# Patient Record
Sex: Male | Born: 1935 | Race: White | Hispanic: No | Smoking: Former smoker
Health system: Southern US, Community
[De-identification: ages and names within clinical notes are randomized; demographics above are authoritative.]

## PROBLEM LIST (undated history)

## (undated) DIAGNOSIS — I10 Essential (primary) hypertension: Secondary | ICD-10-CM

## (undated) DIAGNOSIS — N400 Enlarged prostate without lower urinary tract symptoms: Secondary | ICD-10-CM

## (undated) DIAGNOSIS — I441 Atrioventricular block, second degree: Secondary | ICD-10-CM

## (undated) DIAGNOSIS — I5032 Chronic diastolic (congestive) heart failure: Secondary | ICD-10-CM

## (undated) DIAGNOSIS — I2510 Atherosclerotic heart disease of native coronary artery without angina pectoris: Secondary | ICD-10-CM

## (undated) DIAGNOSIS — R001 Bradycardia, unspecified: Secondary | ICD-10-CM

## (undated) DIAGNOSIS — I639 Cerebral infarction, unspecified: Secondary | ICD-10-CM

## (undated) DIAGNOSIS — K922 Gastrointestinal hemorrhage, unspecified: Secondary | ICD-10-CM

## (undated) DIAGNOSIS — I350 Nonrheumatic aortic (valve) stenosis: Secondary | ICD-10-CM

## (undated) DIAGNOSIS — H3530 Unspecified macular degeneration: Secondary | ICD-10-CM

## (undated) DIAGNOSIS — Z8551 Personal history of malignant neoplasm of bladder: Secondary | ICD-10-CM

## (undated) DIAGNOSIS — M1990 Unspecified osteoarthritis, unspecified site: Secondary | ICD-10-CM

## (undated) DIAGNOSIS — I4821 Permanent atrial fibrillation: Secondary | ICD-10-CM

## (undated) DIAGNOSIS — D649 Anemia, unspecified: Secondary | ICD-10-CM

## (undated) DIAGNOSIS — E785 Hyperlipidemia, unspecified: Secondary | ICD-10-CM

## (undated) HISTORY — PX: COLONOSCOPY: SHX174

## (undated) HISTORY — DX: Unspecified osteoarthritis, unspecified site: M19.90

## (undated) HISTORY — PX: TOE AMPUTATION: SHX809

## (undated) HISTORY — DX: Benign prostatic hyperplasia without lower urinary tract symptoms: N40.0

## (undated) HISTORY — DX: Atrioventricular block, second degree: I44.1

## (undated) HISTORY — DX: Gastrointestinal hemorrhage, unspecified: K92.2

## (undated) HISTORY — DX: Anemia, unspecified: D64.9

## (undated) HISTORY — DX: Essential (primary) hypertension: I10

## (undated) HISTORY — DX: Bradycardia, unspecified: R00.1

## (undated) HISTORY — PX: COSMETIC SURGERY: SHX468

## (undated) HISTORY — DX: Nonrheumatic aortic (valve) stenosis: I35.0

## (undated) HISTORY — PX: ESOPHAGOGASTRODUODENOSCOPY: SHX1529

## (undated) HISTORY — DX: Personal history of malignant neoplasm of bladder: Z85.51

## (undated) HISTORY — DX: Hyperlipidemia, unspecified: E78.5

## (undated) HISTORY — DX: Atherosclerotic heart disease of native coronary artery without angina pectoris: I25.10

---

## 2005-04-19 ENCOUNTER — Ambulatory Visit: Payer: Self-pay | Admitting: Unknown Physician Specialty

## 2005-07-20 ENCOUNTER — Inpatient Hospital Stay: Payer: Self-pay | Admitting: Internal Medicine

## 2005-07-20 ENCOUNTER — Other Ambulatory Visit: Payer: Self-pay

## 2005-07-21 ENCOUNTER — Other Ambulatory Visit: Payer: Self-pay

## 2006-09-19 ENCOUNTER — Encounter: Payer: Self-pay | Admitting: Unknown Physician Specialty

## 2006-10-09 ENCOUNTER — Encounter: Payer: Self-pay | Admitting: Unknown Physician Specialty

## 2006-11-09 ENCOUNTER — Encounter: Payer: Self-pay | Admitting: Unknown Physician Specialty

## 2007-04-02 ENCOUNTER — Ambulatory Visit: Payer: Self-pay | Admitting: Podiatry

## 2007-05-21 ENCOUNTER — Ambulatory Visit: Payer: Self-pay | Admitting: Podiatry

## 2007-06-17 ENCOUNTER — Ambulatory Visit: Payer: Self-pay | Admitting: Podiatry

## 2007-07-16 ENCOUNTER — Encounter: Payer: Self-pay | Admitting: Cardiology

## 2007-07-16 ENCOUNTER — Inpatient Hospital Stay (HOSPITAL_BASED_OUTPATIENT_CLINIC_OR_DEPARTMENT_OTHER): Admission: RE | Admit: 2007-07-16 | Discharge: 2007-07-16 | Payer: Self-pay | Admitting: Cardiology

## 2007-07-16 ENCOUNTER — Ambulatory Visit (HOSPITAL_COMMUNITY): Admission: RE | Admit: 2007-07-16 | Discharge: 2007-07-16 | Payer: Self-pay | Admitting: Cardiology

## 2007-07-16 HISTORY — PX: CARDIAC CATHETERIZATION: SHX172

## 2007-07-20 ENCOUNTER — Ambulatory Visit: Payer: Self-pay | Admitting: Cardiothoracic Surgery

## 2007-07-27 ENCOUNTER — Encounter: Payer: Self-pay | Admitting: Cardiothoracic Surgery

## 2007-07-27 ENCOUNTER — Ambulatory Visit: Payer: Self-pay | Admitting: Cardiothoracic Surgery

## 2007-07-27 ENCOUNTER — Inpatient Hospital Stay (HOSPITAL_COMMUNITY): Admission: RE | Admit: 2007-07-27 | Discharge: 2007-08-06 | Payer: Self-pay | Admitting: Cardiothoracic Surgery

## 2007-07-27 HISTORY — PX: CORONARY ARTERY BYPASS GRAFT: SHX141

## 2007-07-27 HISTORY — PX: AORTIC VALVE REPLACEMENT: SHX41

## 2007-08-25 ENCOUNTER — Ambulatory Visit (HOSPITAL_COMMUNITY): Admission: RE | Admit: 2007-08-25 | Discharge: 2007-08-25 | Payer: Self-pay | Admitting: Cardiology

## 2007-08-27 ENCOUNTER — Ambulatory Visit: Payer: Self-pay | Admitting: Cardiothoracic Surgery

## 2007-08-27 ENCOUNTER — Encounter: Admission: RE | Admit: 2007-08-27 | Discharge: 2007-08-27 | Payer: Self-pay | Admitting: Cardiothoracic Surgery

## 2007-09-09 ENCOUNTER — Encounter: Payer: Self-pay | Admitting: Cardiology

## 2007-10-09 ENCOUNTER — Encounter: Payer: Self-pay | Admitting: Cardiology

## 2007-11-09 ENCOUNTER — Encounter: Payer: Self-pay | Admitting: Cardiology

## 2007-11-26 ENCOUNTER — Ambulatory Visit: Payer: Self-pay | Admitting: Cardiothoracic Surgery

## 2007-12-09 ENCOUNTER — Encounter: Payer: Self-pay | Admitting: Cardiology

## 2008-01-07 ENCOUNTER — Inpatient Hospital Stay: Payer: Self-pay | Admitting: Internal Medicine

## 2008-01-07 ENCOUNTER — Ambulatory Visit: Payer: Self-pay | Admitting: Internal Medicine

## 2008-01-14 ENCOUNTER — Encounter: Payer: Self-pay | Admitting: Internal Medicine

## 2008-02-09 ENCOUNTER — Encounter: Payer: Self-pay | Admitting: Internal Medicine

## 2008-02-23 ENCOUNTER — Encounter: Payer: Self-pay | Admitting: Internal Medicine

## 2008-03-10 ENCOUNTER — Encounter: Payer: Self-pay | Admitting: Internal Medicine

## 2008-04-10 ENCOUNTER — Encounter: Payer: Self-pay | Admitting: Internal Medicine

## 2008-05-10 ENCOUNTER — Encounter: Payer: Self-pay | Admitting: Internal Medicine

## 2008-06-10 ENCOUNTER — Encounter: Payer: Self-pay | Admitting: Internal Medicine

## 2008-07-11 ENCOUNTER — Encounter: Payer: Self-pay | Admitting: Internal Medicine

## 2008-08-08 ENCOUNTER — Encounter: Payer: Self-pay | Admitting: Internal Medicine

## 2008-09-08 ENCOUNTER — Encounter: Payer: Self-pay | Admitting: Internal Medicine

## 2009-02-08 ENCOUNTER — Encounter: Payer: Self-pay | Admitting: Orthopedic Surgery

## 2009-03-10 ENCOUNTER — Encounter: Payer: Self-pay | Admitting: Orthopedic Surgery

## 2009-09-13 HISTORY — PX: US ECHOCARDIOGRAPHY: HXRAD669

## 2010-02-20 ENCOUNTER — Ambulatory Visit: Payer: Self-pay | Admitting: Cardiology

## 2010-08-23 ENCOUNTER — Ambulatory Visit (INDEPENDENT_AMBULATORY_CARE_PROVIDER_SITE_OTHER): Payer: Medicare Other | Admitting: Cardiology

## 2010-08-23 DIAGNOSIS — I10 Essential (primary) hypertension: Secondary | ICD-10-CM

## 2010-08-23 DIAGNOSIS — I359 Nonrheumatic aortic valve disorder, unspecified: Secondary | ICD-10-CM

## 2010-08-23 DIAGNOSIS — E119 Type 2 diabetes mellitus without complications: Secondary | ICD-10-CM

## 2010-08-23 DIAGNOSIS — I2510 Atherosclerotic heart disease of native coronary artery without angina pectoris: Secondary | ICD-10-CM

## 2010-08-29 ENCOUNTER — Other Ambulatory Visit: Payer: Self-pay | Admitting: *Deleted

## 2010-08-29 DIAGNOSIS — I2510 Atherosclerotic heart disease of native coronary artery without angina pectoris: Secondary | ICD-10-CM

## 2010-08-29 MED ORDER — DOXAZOSIN MESYLATE 2 MG PO TABS: 2.0000 mg | ORAL_TABLET | Freq: Every day | ORAL | Status: DC

## 2010-10-23 NOTE — Cardiovascular Report (Signed)
[name], [name]               ACCOUNT NO.:  [id number]   MEDICAL RECORD NO.:  [id number]          PATIENT TYPE:  OIB   LOCATION:  1966                         FACILITY:  [facility]   PHYSICIAN:  Peter M. Swaziland, M.D.  DATE OF BIRTH:  1928-02-21   DATE OF PROCEDURE:  07/16/2007  DATE OF DISCHARGE:                            CARDIAC CATHETERIZATION   INDICATIONS FOR PROCEDURE:  A 77 year old white male who presents with  symptomatic progressive aortic stenosis that is severe by  echocardiogram. He has longstanding history of diabetes and  hypertension.   PROCEDURE:  Left heart catheterization, coronary angiography, access via  the right femoral artery and vein using standard Seldinger technique.   EQUIPMENT:  5-French arterial sheath, 5-French JL-5 catheter, a 4-French  3-DRC catheter, 4-French pigtail catheter, 7-French venous sheath and a  balloon-tip Swan-Ganz catheter.   MEDICATIONS:  Local anesthesia 1% Xylocaine.   CONTRAST:  110 mL of Omnipaque.   HEMODYNAMIC DATA:  Right heart pressures:  Right atrial pressure 6/6  with a mean of 3 mmHg.  Right ventricle pressure was 42 with EDP of 4  mmHg.  Pulmonary artery pressures 33/12 with a mean of 22 mmHg.  Pulmonary capillary wedge pressure 16/12 with a mean of 9 mmHg.  Aortic  pressure was 151/67 with a mean of 103 mmHg.  Left ventricular pressures  were not obtained as we did not cross the valve.  Cardiac output by Fick  was 6.7 liters per minute with an index of 2.86.  By thermodilution,  cardiac output was 7.5 with an index of 3.21.   ANGIOGRAPHIC DATA:  Proximal aortography demonstrated mild diffuse  dilatation of the thoracic aorta.  The aortic valve is heavily calcified  and immobile.  There is mild aortic insufficiency.   The left coronary rises and distributes normally.  The left main  coronary is without obstructive disease.   The left anterior descending artery is calcified in the proximal and mid  vessel.  In the  mid vessel, there is a focal 50-70% stenosis.  There is  also a 50-60% stenosis at the takeoff of second diagonal branch which  arises somewhat distally.   The left circumflex coronary is relatively small in caliber with mild  diffuse irregularities less than 20%.   The right coronary is a very large dominant vessel and appears normal.   FINAL INTERPRETATION:  1. Single vessel atherosclerotic coronary artery disease involving the      mid-LAD.  2. Mild aortic insufficiency.  3. Mild pulmonary hypertension.  4. Mild enlargement of the thoracic aorta.   PLAN:  Recommend referral for aortic valve replacement and probable  bypass surgery of the left anterior descending.           ______________________________  Peter M. Swaziland, M.D.     PMJ/MEDQ  D:  07/16/2007  T:  07/17/2007  Job:  [id number]   cc:   Bradd Canary, MD
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
## 2010-10-23 NOTE — Op Note (Signed)
[name], [name]               ACCOUNT NO.:  [id number]   MEDICAL RECORD NO.:  [id number]          PATIENT TYPE:  OIB   LOCATION:  2854                         FACILITY:  [facility]   PHYSICIAN:  Peter M. Swaziland, M.D.  DATE OF BIRTH:  1928/06/03   DATE OF PROCEDURE:  08/25/2007  DATE OF DISCHARGE:                               OPERATIVE REPORT   PROCEDURE:  Elective cardioversion.   INDICATIONS FOR PROCEDURE:  A 77 year old white male status post aortic  valve replacement and coronary artery bypass surgery who has persistent  atrial flutter despite amiodarone therapy.   The initial ECG on arrival showed wide complex rhythm at a rate of 100  beats per minute.  There were no P-waves or flutter waves visible.  We  attempted carotid sinus massage without significant change in the  rhythm.  The patient was then given 6 mg of IV Adenocard which allowed  slowing of his rate and visible flutter waves.  It was therefore felt  that he was in atrial flutter with 2:1 AV conduction.  We proceeded with  elective DC cardioversion.  The patient received 250 mg of IV Pentothal  per anesthesia.  He was given a single synchronized biphasic DC shock at  150 joules with conversion to normal sinus rhythm with first-degree AV  block and a rate of 77 beats per minute.  The patient tolerated the  procedure well without complications.   FINAL ASSESSMENT:  Successful elective direct current cardioversion.           ______________________________  Peter M. Swaziland, M.D.     PMJ/MEDQ  D:  08/25/2007  T:  08/25/2007  Job:  [id number]   cc:   Sheliah Plane, MD  Diona Fanti

## 2010-10-23 NOTE — Discharge Summary (Signed)
NAMETHEOREN, PALKA               ACCOUNT NO.:  [id number]   MEDICAL RECORD NO.:  [id number]          PATIENT TYPE:  INP   LOCATION:  2015                         FACILITY:  [facility]   PHYSICIAN:  Sheliah Plane, MD    DATE OF BIRTH:  01/27/1928   DATE OF ADMISSION:  07/27/2007  DATE OF DISCHARGE:  07/31/2007                               DISCHARGE SUMMARY   PRIMARY ADMITTING DIAGNOSIS:  Severe aortic stenosis.   ADDITIONAL/DISCHARGE DIAGNOSES:  1. Severe aortic stenosis.  2. Single-vessel coronary artery disease.  3. Hypertension.  4. Benign prostatic hypertrophy.  5. Postoperative blood loss anemia.  6. Type 2 diabetes mellitus, insulin dependent.   PROCEDURES PERFORMED:  1. Aortic valve replacement with 25-mm Edwards Magna pericardial      valve.  2. Coronary artery bypass grafting x1 (left internal mammary artery to      the LAD).   HISTORY:  The patient is a 77 year old male who initially presented  several years ago with vague chest discomfort and was noted to have a  cardiac murmur.  He was found to have aortic stenosis which has been  followed with serial echocardiograms since that time by Dr. Swaziland.  His  symptoms have been progressive, particularly dyspnea on exertion with  lightheadedness.  He has had no chest pain or palpitations.  A recent  echocardiogram showed worsening aortic stenosis with peak velocity of  4.9 meters per second.  He had moderate concentric left ventricular  hypertrophy with normal systolic function, severe calcific aortic  stenosis, trace mitral regurgitation, and a normal aortic root.  He  underwent left and right heart catheterization which confirmed critical  aortic stenosis as well as approximately 60% stenosis of the LAD.  He  was subsequently referred to Dr. Sheliah Plane for consideration of  aortic valve replacement at this time.  Dr. Tyrone Sage saw the patient and  reviewed his studies and felt that best course of action would be to  proceed with aortic valve replacement and single-vessel bypass at this  time.  He explained the risks, benefits and alternatives of the  procedure to the patient and family, and he agreed to proceed with  surgery.   HOSPITAL COURSE:  He was admitted to [facility] on July 27, 2007 and underwent aortic valve replacement as well as CABG x1 as  described in detail above.  He tolerated the procedure well and was  transferred to the SICU in stable condition.  He was able to be  extubated shortly after surgery.  He was hemodynamically stable and  doing well on postoperative day #1.  At that time, his chest tubes and  hemodynamic monitoring devices were removed.  He was kept in the unit  for further observation.  He was started on aggressive diuresis for  postoperative volume overload.  By postoperative day #2, he was ready  for transfer to the floor.  Postoperatively, his blood pressures have  been trending upward, and he has been started back on his home dose of  amlodipine as well as started on a beta blocker which has been titrated  upward and back on an ACE inhibitor which has been titrated upward.  As  his p.o. intake has improved, he has been restarted on his home doses of  insulin, and his blood sugars have responded well.  He is diuresing well  although he still remains volume overloaded with weight approximately 12  kg above his preoperative weight.  Because of the hyponatremia with his  aggressive diuresis, his Lasix dose has been decreased to once daily,  and a BMET will be rechecked prior to discharge.  He is otherwise doing  fairly well.  He is ambulating the halls with cardiac rehab phase 1 and  is making good progress.  His incisions are all healing well.  He has  remained afebrile, and his vital signs have otherwise remained stable.  He is maintaining O2 saturations of greater than 90% on room air.  He  has had a mild blood loss anemia which has been stable, and he  has been  started on oral iron supplements.  His most recent labs show sodium 129,  potassium 4.8, BUN 29, creatinine 1.18, white count 14.8, hemoglobin  8.7, hematocrit 25.9, platelets 137.  He will have repeat labs on the  morning of August 01, 2007.  It is anticipated if he continues to  progress well over the next 48 hours or so he will hopefully be ready  for discharge home.   DISCHARGE MEDICATIONS:  1. Enteric-coated aspirin 325 mg daily.  2. Lisinopril 20 mg daily.  3. Lipitor 10 mg daily.  4. Nu-Iron 150 mg daily.  5. Folic acid 1 mg daily.  6. Lasix 40 mg daily for 1 week.  7. K-Dur 20 mEq daily for 1 week.  8. Tylox 1 to 2o q.4h. p.r.n. for pain.  9. Metoprolol 25 mg b.i.d.  10.Proscar 5 mg daily.  11.Amlodipine 5 mg daily.  12.Multivitamin daily.  13.Zinc 50 mg b.i.d.  14.Vitamin C daily.  15.NPH 20 units b.i.d.  16.Humalog sliding-scale insulin as directed at home.   DISCHARGE INSTRUCTIONS:  He is asked to refrain from driving, heavy  lifting or strenuous activity.  He may continue ambulating daily and  using his incentive spirometer.  He may shower daily and clean his  incisions with soap and water.  He will continue a low-fat, low-sodium,  carbohydrate modified diet.   DISCHARGE FOLLOWUP:  He will see Dr. Swaziland back in 2 weeks for follow-  up.  He will see Dr. Tyrone Sage on August 27, 2007 with a chest x-ray from  [facility].  Home health R.N. and PT have been arranged.  If  experiences problems or has questions in the interim, he is asked to  contact our office.      Coral Ceo, P.A.      Sheliah Plane, MD  Electronically Signed    GC/MEDQ  D:  07/31/2007  T:  08/01/2007  Job:  [id number]   cc:   Larry Harper  Peter M. Swaziland, M.D.

## 2010-10-23 NOTE — Assessment & Plan Note (Signed)
OFFICE VISIT   [name], [name]  DOB:  1928-01-17                                        November 26, 2007  CHART #:  [id number]   HISTORY OF PRESENT ILLNESS:  [name] returns to the office today in  followup after his aortic valve replacement done on July 27, 2007.  Postoperatively, he had had atrial fibrillation, was treated with  Coumadin and amiodarone, and ultimately underwent outpatient  cardioversion.  Since that time, he has remained in sinus rhythm and is  now off Coumadin.  He denies any exertional chest pain, angina, and  overall his physical activity is increasing appropriately as he  continues in heart stride program at [facility].   PHYSICAL EXAMINATION:  VITAL SIGNS:  His blood pressure is 161/68, pulse  is 54 and regular, respiratory rate is 24, and O2 sats 98%.  CHEST:  His sternum is stable and well healed.  LUNGS:  Clear bilaterally.  CARDIAC:  Regular rate and rhythm.  There is no murmur of aortic  insufficiency appreciated.  EXTREMITIES:  He has a very trace pedal edema.   PLAN:  Overall, I am very pleased with his progress.  I encouraged him  to continue his cardiac rehab program.  He is closely followed by Dr.  Swaziland.  I have not made him a return appointment, but would be glad to  see him at his or Dr. Elvis [name] request at any time.   Kenneth Plane, MD  Electronically Signed   EG/MEDQ  D:  11/26/2007  T:  11/27/2007  Job:  [id number]   cc:   [name], M.D.

## 2010-10-23 NOTE — Op Note (Signed)
[name], [name]               ACCOUNT NO.:  [id number]   MEDICAL RECORD NO.:  [id number]          PATIENT TYPE:  INP   LOCATION:  2015                         FACILITY:  [facility]   PHYSICIAN:  Sheliah Plane, MD    DATE OF BIRTH:  Oct 31, 1927   DATE OF PROCEDURE:  07/27/2007  DATE OF DISCHARGE:                               OPERATIVE REPORT   PREOPERATIVE DIAGNOSIS:  Critical aortic stenosis and coronary occlusive  disease.   POSTOPERATIVE DIAGNOSIS:  Critical aortic stenosis and coronary  occlusive disease.   SURGICAL PROCEDURE:  Aortic valve replacement with a pericardial tissue  valve, [employer], model number 3000, 25 mm, serial number  [id number] and coronary artery bypass grafting x1 with the left internal  mammary to the left anterior descending coronary artery.   SURGEON:  Sheliah Plane, MD.   FIRST ASSISTANT:  Coral Ceo, PA.   BRIEF HISTORY:  The patient is a 77 year old male who had known aortic  stenosis followed for the past several years who began developing  increasing symptoms of faintness and shortness of breath with exertion,  serial echoes cardiogram she showed progression of the degree of aortic  stenosis with a velocity across the aortic valve of greater than 4  meters per second.  Because of the severe critical aortic stenosis, the  patient was evaluated for surgery.  Cardiac catheterization was  performed by Dr. Swaziland which demonstrated 70% lesion of the LAD, 40-50%  lesion of small distal circumflex branch, aortic valve replacement with  a tissue valve and coronary artery bypass grafting was recommended to  the patient, who agreed and signed informed consent.   DESCRIPTION OF PROCEDURE:  With Swan-Ganz and arterial line monitors in  place, the patient underwent general endotracheal anesthesia without  incident.  The skin of the chest and legs was prepped with Betadine and  draped in the usual sterile manner.  A median sternotomy was  performed  simultaneously as transesophageal echo probe was placed, dictated under  separate note, but confirming the high grade aortic stenosis with  preserved LV function.  Median sternotomy was performed.  The left  internal mammary artery was dissected down as pedicle graft.  The distal  artery was divided, had good free flow.  The pericardium was opened.  The patient had evidence of left ventricular hypertrophy, but overall  preserved LV function.  He was systemically heparinized.  The ascending  aorta was cannulated.  Right atrium was cannulated.  A retrograde  cardioplegia catheter was placed through a separate site in the right  atrium into the coronary sinus.  The patient was placed on  cardiopulmonary bypass at 2.4 liters per minute per meter squared.  Sites of anastomosis were inspected in the LAD.  The patient's body  temperature was cooled to 32 degrees.  Aortic crossclamp was applied,  500 mL of cold blood potassium cardioplegia was administered with rapid  diastolic arrest of the heart.  Myocardial septal temperature was  monitored throughout the crossclamp.  Attention was turned first to the  coronary bypass.  The LAD was opened in the midportion using  a running 8-  0 Prolene.  The left internal mammary artery was anastomosed to the left  anterior descending coronary artery, fascia was tacked to the  epicardium.  Attention was then turned to the aortic valve and a  transverse aortotomy was performed giving good visualization of a  tricuspid, highly calcified aortic valve and annulus.  The valve was  excised and annulus debrided.  The annulus was sized for 25 Magna  pericardial valve, #2 Tycron pledgeted sutures with pledgets on the  ventricular surface were placed circumferentially around the annulus and  used to secure the valve in place.  The valve seated well without  obstruction of the coronaries.  Care was taken to remove all loose  calcific debris.  The aortotomy was  closed with horizontal mattress  Prolene suture over felt strips.  Prior to complete closure of the  aortotomy, heart was allowed to passively fill and deair.  The aortic  crossclamp was removed with total crossclamp time of 105 minutes.  Prior  to removal of crossclamp, the bulldog on the mammary artery was removed  and there was prompt rise in myocardial septal temperature.  Transient,  the patient required AV pacing but ultimately returned to a sinus rhythm  with a first degree block with the body temperature rewarmed.  The right  superior pulmonary vein vent was removed.  The patient was then  ventilated and weaned from cardiopulmonary bypass without difficulty.  He remained hemodynamically stable, was decannulated in usual fashion.  Protamine sulfate was administered.  With the operative field  hemostatic, two atrial and two ventricular pacing wires had been  applied.  Left pleural tube and a Blake mediastinal drain were left in  place.  Sternum was closed with #6 stainless steel wire.  Fascia closed  with interrupted 0 Vicryl, running 3-0 Vicryl in the subcutaneous  tissue, 4-0 subcuticular stitch in the skin edges.  Dry dressing placed.  Sponge and needle count was reported as correct at the completion of the  procedure.  The patient tolerated the procedure without obvious  complication and was transferred to the surgical intensive care unit for  further postoperative care.      Sheliah Plane, MD  Electronically Signed     EG/MEDQ  D:  07/30/2007  T:  07/30/2007  Job:  [id number]   cc:   Peter M. Swaziland, M.D.

## 2010-10-23 NOTE — Op Note (Signed)
[name], [name]               ACCOUNT NO.:  [id number]   MEDICAL RECORD NO.:  [id number]          PATIENT TYPE:  INP   LOCATION:  2306                         FACILITY:  [facility]   PHYSICIAN:  [name], M.D.DATE OF BIRTH:  08-25-1927   DATE OF PROCEDURE:  07/27/2007  DATE OF DISCHARGE:                               OPERATIVE REPORT   PROCEDURE:  Intraoperative transesophageal echocardiogram.   INDICATIONS FOR PROCEDURE:  [name] is a 77 year old gentleman who  presents today for aortic valve replacement and coronary artery bypass  grafting by Dr. Tyrone Sage.  We have been asked to place a TEE probe for  evaluation of cardiac function, structures pre and postoperative.   DESCRIPTION OF PROCEDURE:  The patient was brought to the holding area  the morning of surgery where under local anesthesia with sedation,  pulmonary artery catheter and radial arterial lines were placed.  He was  then taken to the OR for routine induction of general anesthesia after  which the TEE probe was protected, lubricated and passed oropharyngeally  into the stomach, slightly withdrawn for imaging of the cardiac  structures.   PRECARDIOPULMONARY BYPASS EXAMINATION:  Left ventricle.  Left  ventricular chamber is seen in the short axis view initially as  concentrically revealing left ventricular hypertrophy in all segmental  wall areas in the short axis view.  There is good overall contractile  pattern noted with thickening in all segmental wall areas in the short  axis view.  In the long-axis view, again reveals good posterior and  anterior wall contractile thickening with thickening all the way to the  apex.  There were essentially no significant hypocontractile areas noted  in both short and long-axis views of this concentrically hypertrophied  left ventricular chamber.   Mitral valve.  Mitral valve initially viewed in a four-chamber view  which shows both thickened posterior and anterior  leaflets.  However,  they open satisfactorily for diastolic inflow and appear to collapse  just below the level of the annulus appropriately.  On color Doppler  examination, there is essentially no mitral regurgitant flow noted.  Multiple views are obtained including commissural views, again revealing  essentially no mitral regurgitant flow.   Left atrium.  Normal size chamber visualized.  The appendage visualized.  It is clear the interatrial septum is interrogated and intact.   The aortic valve.  In the short axis view of the aortic valve, there is  heavy calcium noted to the point that it essentially obscured any normal  anatomy.  Multiple views obtained both short and long axis.  We tried to  visualize whether this has essentially three cusps and it appears that  it does.  However, the calcium is of such an extent and the motion is so  limited that it is difficult to obtain any significant details.  The  long-axis view does show that there is about 1.5 to 1+ aortic  insufficiency noted as well as a significant turbulent jet noted above  the level of the aortic valve during systolic contraction.  With color  Doppler off, the motion seen in  this long-axis view reveals a severely  restricted and limited motion of the two leaflets that are seen and they  are heavily calcified.  Hemodynamic parameters I calculated with P  gradients around 55-56 with mean at about 34-35.  Calculated aortic  valve area is 0.8 sq m by calculation.   Right ventricle.  Tricuspid valve and right atrium are normal chamber  size and function. The  PA catheter is seen in the right atrium  descending into the right ventricle and beyond.  No other masses are  noted.   The patient is placed on cardiopulmonary bypass and coronary artery  bypass grafting was carried out followed by aortotomy and replacement of  the calcified aortic valve with a pericardial tissue valve.  De-airing  maneuvers were carried out, the  patient was rewarmed and separated from  cardiopulmonary bypass with the initial attempt.   POST CARDIOPULMONARY BYPASS EXAMINATION:   Left ventricle.  Left ventricular chamber again seen on short and long-  axis views.  It is a paced chamber at this time.  There are mitral  bubbles noted but with separation of cardiopulmonary bypass, these are  removed.   Aortic valve.  In place of the disease, aortic valve can now be seen,  well-placed prosthetic valve with normal thin, compliant, mobile  leaflets well visualized, well opening appropriately with no obstruction  to flow and during diastole there is essentially no aortic insufficiency  that is noted. This appears to be a completely satisfactorily replaced  aortic valve with this prosthetic valve.   The rest of the cardiac examination was as previously described.  Several other images are obtained again without significant change  from  the prebypass period and the patient was returned to the cardiac  intensive care unit in stable condition.           ______________________________  [name], M.D.     JTM/MEDQ  D:  07/27/2007  T:  07/28/2007  Job:  [id number]
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
## 2010-10-23 NOTE — Discharge Summary (Signed)
NAMEHAYGEN, [name]               ACCOUNT NO.:  [id number]   MEDICAL RECORD NO.:  [id number]          PATIENT TYPE:  INP   LOCATION:  2019                         FACILITY:  [facility]   PHYSICIAN:  Sheliah Plane, MD    DATE OF BIRTH:  06-20-1927   DATE OF ADMISSION:  07/27/2007  DATE OF DISCHARGE:  08/06/2007                               DISCHARGE SUMMARY   ADDENDUM:  Mr. Nunn was tentatively scheduled for discharge on or  about July 31, 2007.  Unfortunately, he did develop atrial  fibrillation.  This required institution of amiodarone therapy, as well  as adjustments in his beta-blockade.  Currently, he is in a controlled  rate, atrial flutter.  His most recent INR today's date, August 06, 2007, is 1.8.  Additionally, during that time, he has required  continuation of his cardiac rehabilitation which is slowly progressing.  Additionally, he has required additional diuresis.  This also has showed  continual improvement.  His most recently electrolytes dated August 05, 2007, showed sodium 134, potassium 3.8, chloride 96, CO2 of 30, BUN  17, creatinine 1.16, glucose 65.  Most recent BNP dated August 05, 2007, is 560.  His capillary blood glucose showed adequate control on  its current regimen.  Oxygen has been weaned and he maintains good  saturations on room air.  His incision continues to heal nicely without  evidence of infection.  His overall status is deemed to be acceptable on  today's date, August 06, 2007, for discharge.   CONDITION ON DISCHARGE:  Stable and improving.   MEDICATIONS AT TIME OF DISCHARGE:  As follows:  1. Aspirin 325 mg daily.  2. Lopressor 75 mg twice daily.  3. Lisinopril 20 mg daily.  4. Lipitor 10 mg daily.  5. Nu-Iron 150 mg daily.  6. Folic acid 1 mg daily.  7. Lasix 40 mg daily for an additional week.  8. K-Dur 20 mEq daily for an additional week.  9. Coumadin 2.5 mg daily and as directed.  10.Amiodarone 400 mg daily.  11.Norvasc 5  mg daily for pain.  12.Tylox 1 or 2 every 4 to 6 hours as needed.   INSTRUCTIONS:  He will receive written instructions regarding  medications, activity, diet, wound care, and followup.   FOLLOWUP:  Will include INR check on August 10, 2007, at the cardiology  office.  Additionally, he is instructed to see Dr. Swaziland in 2 weeks,  see Dr. Tyrone Sage on August 27, 2007, at 12:30.   FINAL DIAGNOSES:  Severe aortic stenosis and coronary artery disease as  described.   OTHER DIAGNOSES:  Also include:  1. Postoperative atrial fibrillation requiring anticoagulation      therapy.  2. Postoperative volume overload.  3. History of hypertension.  4. History of benign prostatic hyperplasia.      Rowe Clack, P.A.-C.      Sheliah Plane, MD  Electronically Signed    WEG/MEDQ  D:  08/06/2007  T:  08/06/2007  Job:  [id number]   cc:   Sheliah Plane, MD  Peter M. Swaziland, M.D.  Diona Fanti

## 2010-10-23 NOTE — Assessment & Plan Note (Signed)
OFFICE VISIT   [name], [name]  DOB:  06-04-1928                                        August 27, 2007  CHART #:  [id number]   [name] returns to the office today in followup after his recent  aortic valve replacement with 25 mm tissue valve and coronary artery  bypass grafting x1 done July 27, 2007.  The patient developed  postoperative atrial fibrillation and atrial flutter, was started on  Coumadin.  Earlier this week, he underwent successful cardioversion by  Dr. Swaziland.  He appears to remain in sinus rhythm at this point.  He  seems to be increasing his physical activity appropriately.  He has had  no overt symptoms of congestive heart failure.   ON EXAM:  VITAL SIGNS:  Blood pressure 142/66, pulse 60, respiratory  rate 18, O2 sat 99%.  Sternum is stable and well healed.  LUNGS:  Clear bilaterally.  CARDIOVASCULAR:  Reveals a regular rate without murmur of aortic  stenosis or insufficiency.  EXTREMITIES:  He has trace pedal edema.   Followup chest x-ray shows very slight blunting of the left costophrenic  angle, otherwise, clear lung fields.   Overall, I am very pleased with his progress.  He continues to have  physical therapy at home and recommended that he start in cardiac rehab  when he no longer needs in home physical therapy.  I plan to see him  back in 3 months.   Sheliah Plane, MD  Electronically Signed   EG/MEDQ  D:  08/27/2007  T:  08/27/2007  Job:  [id number]   cc:   Peter M. Sw[place]iland, M.D.
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
## 2011-01-19 ENCOUNTER — Inpatient Hospital Stay: Payer: Medicare Other

## 2011-01-19 DIAGNOSIS — I369 Nonrheumatic tricuspid valve disorder, unspecified: Secondary | ICD-10-CM

## 2011-01-20 DIAGNOSIS — D649 Anemia, unspecified: Secondary | ICD-10-CM

## 2011-01-20 DIAGNOSIS — I5033 Acute on chronic diastolic (congestive) heart failure: Secondary | ICD-10-CM

## 2011-01-31 ENCOUNTER — Encounter: Payer: Self-pay | Admitting: Cardiovascular Disease

## 2011-02-05 ENCOUNTER — Telehealth: Payer: Self-pay | Admitting: Cardiology

## 2011-02-05 NOTE — Telephone Encounter (Signed)
Called because her husbands blood pressure and pulse were a little low today. Please call back. I have pulled his chart.

## 2011-02-06 NOTE — Telephone Encounter (Signed)
Wife called stating his heart rate has been in mid forties and low 50's since discharged from hospital. When reviewing meds he states the doctors put him on Metoprolol tar 25 mg BID. Per Dr. Swaziland advised to d/c. [name] states they put him back on med for his high BP. In Dr. Elvis Coil office note he makes note that [name] does not tolerate Beta Blockers- drops heart rate. Mr. Topel is scheduled to see [place] next week and advised to bring all of meds with him.

## 2011-02-07 ENCOUNTER — Encounter: Payer: Self-pay | Admitting: Cardiology

## 2011-02-12 ENCOUNTER — Encounter: Payer: Self-pay | Admitting: Cardiology

## 2011-02-12 ENCOUNTER — Ambulatory Visit (INDEPENDENT_AMBULATORY_CARE_PROVIDER_SITE_OTHER): Payer: Medicare Other | Admitting: Cardiology

## 2011-02-12 DIAGNOSIS — I359 Nonrheumatic aortic valve disorder, unspecified: Secondary | ICD-10-CM

## 2011-02-12 DIAGNOSIS — I2510 Atherosclerotic heart disease of native coronary artery without angina pectoris: Secondary | ICD-10-CM | POA: Insufficient documentation

## 2011-02-12 DIAGNOSIS — R001 Bradycardia, unspecified: Secondary | ICD-10-CM | POA: Insufficient documentation

## 2011-02-12 DIAGNOSIS — I350 Nonrheumatic aortic (valve) stenosis: Secondary | ICD-10-CM | POA: Insufficient documentation

## 2011-02-12 DIAGNOSIS — K31819 Angiodysplasia of stomach and duodenum without bleeding: Secondary | ICD-10-CM | POA: Insufficient documentation

## 2011-02-12 DIAGNOSIS — K922 Gastrointestinal hemorrhage, unspecified: Secondary | ICD-10-CM

## 2011-02-12 DIAGNOSIS — I498 Other specified cardiac arrhythmias: Secondary | ICD-10-CM

## 2011-02-12 DIAGNOSIS — E785 Hyperlipidemia, unspecified: Secondary | ICD-10-CM | POA: Insufficient documentation

## 2011-02-12 DIAGNOSIS — I10 Essential (primary) hypertension: Secondary | ICD-10-CM

## 2011-02-12 NOTE — Assessment & Plan Note (Signed)
He is currently off of aspirin therapy. His valve function is normal by exam.

## 2011-02-12 NOTE — Assessment & Plan Note (Signed)
We will obtain a copy of his recent hospital records. He is scheduled for followup check of his hemoglobin today with his primary care.

## 2011-02-12 NOTE — Patient Instructions (Signed)
Continue your current medication.  Avoid salt.  I will see you again in 6 months.   

## 2011-02-12 NOTE — Assessment & Plan Note (Signed)
Given his history of bradycardia we have recommended stopping his metoprolol.
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
## 2011-02-12 NOTE — Assessment & Plan Note (Signed)
No significant anginal symptoms.

## 2011-03-01 LAB — URINALYSIS, ROUTINE W REFLEX MICROSCOPIC
Bilirubin Urine: NEGATIVE
Bilirubin Urine: NEGATIVE
Glucose, UA: 250 — AB
Glucose, UA: NEGATIVE
Hgb urine dipstick: NEGATIVE
Hgb urine dipstick: NEGATIVE
Ketones, ur: NEGATIVE
Ketones, ur: NEGATIVE
Leukocytes, UA: NEGATIVE
Leukocytes, UA: NEGATIVE
Nitrite: NEGATIVE
Nitrite: NEGATIVE
Protein, ur: 100 — AB
Protein, ur: 30 — AB
Specific Gravity, Urine: 1.011
Specific Gravity, Urine: 1.012
Urobilinogen, UA: 0.2
Urobilinogen, UA: 0.2
pH: 7
pH: 5.5

## 2011-03-01 LAB — CBC
HCT: 25.7 — ABNORMAL LOW
HCT: 25.9 — ABNORMAL LOW
HCT: 26.1 — ABNORMAL LOW
HCT: 26.8 — ABNORMAL LOW
HCT: 27.5 — ABNORMAL LOW
HCT: 28.3 — ABNORMAL LOW
HCT: 29 — ABNORMAL LOW
HCT: 31.4 — ABNORMAL LOW
HCT: 33.7 — ABNORMAL LOW
HCT: 38.6 — ABNORMAL LOW
Hemoglobin: 10 — ABNORMAL LOW
Hemoglobin: 10.6 — ABNORMAL LOW
Hemoglobin: 11.4 — ABNORMAL LOW
Hemoglobin: 13.1
Hemoglobin: 8.7 — ABNORMAL LOW
Hemoglobin: 8.8 — ABNORMAL LOW
Hemoglobin: 8.9 — ABNORMAL LOW
Hemoglobin: 9.2 — ABNORMAL LOW
Hemoglobin: 9.4 — ABNORMAL LOW
Hemoglobin: 9.8 — ABNORMAL LOW
MCHC: 33.7
MCHC: 33.7
MCHC: 33.8
MCHC: 33.9
MCHC: 34
MCHC: 34.3
MCHC: 34.3
MCHC: 34.3
MCHC: 34.5
MCHC: 34.5
MCV: 90.9
MCV: 91
MCV: 91.2
MCV: 91.4
MCV: 91.7
MCV: 91.7
MCV: 91.8
MCV: 91.9
MCV: 92
MCV: 92.1
Platelets: 133 — ABNORMAL LOW
Platelets: 137 — ABNORMAL LOW
Platelets: 140 — ABNORMAL LOW
Platelets: 148 — ABNORMAL LOW
Platelets: 163
Platelets: 195
Platelets: 242
Platelets: 263
Platelets: 295
Platelets: 309
RBC: 2.81 — ABNORMAL LOW
RBC: 2.82 — ABNORMAL LOW
RBC: 2.85 — ABNORMAL LOW
RBC: 2.94 — ABNORMAL LOW
RBC: 2.98 — ABNORMAL LOW
RBC: 3.07 — ABNORMAL LOW
RBC: 3.19 — ABNORMAL LOW
RBC: 3.42 — ABNORMAL LOW
RBC: 3.67 — ABNORMAL LOW
RBC: 4.22
RDW: 12.7
RDW: 13
RDW: 13
RDW: 13.2
RDW: 13.3
RDW: 13.6
RDW: 13.6
RDW: 13.6
RDW: 13.7
RDW: 13.8
WBC: 12.7 — ABNORMAL HIGH
WBC: 12.9 — ABNORMAL HIGH
WBC: 13.2 — ABNORMAL HIGH
WBC: 13.3 — ABNORMAL HIGH
WBC: 13.4 — ABNORMAL HIGH
WBC: 14.8 — ABNORMAL HIGH
WBC: 15.3 — ABNORMAL HIGH
WBC: 16.1 — ABNORMAL HIGH
WBC: 18.2 — ABNORMAL HIGH
WBC: 7.9

## 2011-03-01 LAB — I-STAT EC8
Acid-base deficit: 5 — ABNORMAL HIGH
BUN: 15
Bicarbonate: 20.3
Chloride: 106
Glucose, Bld: 99
HCT: 31 — ABNORMAL LOW
Hemoglobin: 10.5 — ABNORMAL LOW
Operator id: 203371
Potassium: 4.4
Sodium: 136
TCO2: 21
pCO2 arterial: 36.2
pH, Arterial: 7.356

## 2011-03-01 LAB — BASIC METABOLIC PANEL
BUN: 17
BUN: 18
BUN: 19
BUN: 22
BUN: 24 — ABNORMAL HIGH
BUN: 26 — ABNORMAL HIGH
BUN: 28 — ABNORMAL HIGH
BUN: 29 — ABNORMAL HIGH
CO2: 21
CO2: 23
CO2: 25
CO2: 27
CO2: 27
CO2: 28
CO2: 29
Calcium: 7.6 — ABNORMAL LOW
Calcium: 7.8 — ABNORMAL LOW
Calcium: 8.2 — ABNORMAL LOW
Calcium: 8.2 — ABNORMAL LOW
Calcium: 8.3 — ABNORMAL LOW
Calcium: 8.4
Calcium: 8.4
Calcium: 8.9
Chloride: 108
Chloride: 95 — ABNORMAL LOW
Chloride: 96
Chloride: 96
Chloride: 97
Chloride: 98
Chloride: 99
Creatinine, Ser: 1.01
Creatinine, Ser: 1.08
Creatinine, Ser: 1.09
Creatinine, Ser: 1.13
Creatinine, Ser: 1.18
Creatinine, Ser: 1.18
Creatinine, Ser: 1.3
GFR calc Af Amer: >60
GFR calc Af Amer: >60
GFR calc Af Amer: >60
GFR calc Af Amer: >60
GFR calc Af Amer: >60
GFR calc Af Amer: >60
GFR calc Af Amer: >60
GFR calc non Af Amer: 53 — ABNORMAL LOW
GFR calc non Af Amer: 60 — ABNORMAL LOW
GFR calc non Af Amer: 60 — ABNORMAL LOW
GFR calc non Af Amer: >60
GFR calc non Af Amer: >60
GFR calc non Af Amer: >60
GFR calc non Af Amer: >60
GFR calc non Af Amer: >60
Glucose, Bld: 112 — ABNORMAL HIGH
Glucose, Bld: 126 — ABNORMAL HIGH
Glucose, Bld: 169 — ABNORMAL HIGH
Glucose, Bld: 189 — ABNORMAL HIGH
Glucose, Bld: 225 — ABNORMAL HIGH
Glucose, Bld: 303 — ABNORMAL HIGH
Glucose, Bld: 65 — ABNORMAL LOW
Glucose, Bld: 90
Potassium: 3.8
Potassium: 3.9
Potassium: 4.2
Potassium: 4.6
Potassium: 4.8
Potassium: 4.9
Potassium: 4.9
Sodium: 128 — ABNORMAL LOW
Sodium: 129 — ABNORMAL LOW
Sodium: 130 — ABNORMAL LOW
Sodium: 130 — ABNORMAL LOW
Sodium: 131 — ABNORMAL LOW
Sodium: 133 — ABNORMAL LOW
Sodium: 134 — ABNORMAL LOW
Sodium: 134 — ABNORMAL LOW

## 2011-03-01 LAB — POCT I-STAT 4, (NA,K, GLUC, HGB,HCT)
Glucose, Bld: 108 — ABNORMAL HIGH
Glucose, Bld: 115 — ABNORMAL HIGH
Glucose, Bld: 136 — ABNORMAL HIGH
Glucose, Bld: 137 — ABNORMAL HIGH
Glucose, Bld: 138 — ABNORMAL HIGH
Glucose, Bld: 97
HCT: 22 — ABNORMAL LOW
HCT: 24 — ABNORMAL LOW
HCT: 26 — ABNORMAL LOW
HCT: 31 — ABNORMAL LOW
HCT: 33 — ABNORMAL LOW
HCT: 35 — ABNORMAL LOW
Hemoglobin: 10.5 — ABNORMAL LOW
Hemoglobin: 11.2 — ABNORMAL LOW
Hemoglobin: 11.9 — ABNORMAL LOW
Hemoglobin: 7.5 — CL
Hemoglobin: 8.2 — ABNORMAL LOW
Hemoglobin: 8.8 — ABNORMAL LOW
Operator id: 137421
Operator id: 3406
Operator id: 3406
Operator id: 3406
Operator id: 3406
Operator id: 3406
Potassium: 4.4
Potassium: 4.4
Potassium: 4.5
Potassium: 4.5
Potassium: 4.6
Potassium: 4.9
Sodium: 127 — ABNORMAL LOW
Sodium: 132 — ABNORMAL LOW
Sodium: 132 — ABNORMAL LOW
Sodium: 133 — ABNORMAL LOW
Sodium: 134 — ABNORMAL LOW
Sodium: 134 — ABNORMAL LOW

## 2011-03-01 LAB — ABO/RH: ABO/RH(D): O POS

## 2011-03-01 LAB — I-STAT 8, (EC8 V) (CONVERTED LAB)
Acid-base deficit: 4 — ABNORMAL HIGH
BUN: 26 — ABNORMAL HIGH
Bicarbonate: 21.7
Chloride: 97
Glucose, Bld: 289 — ABNORMAL HIGH
HCT: 30 — ABNORMAL LOW
Hemoglobin: 10.2 — ABNORMAL LOW
Operator id: 241461
Potassium: 5
Sodium: 128 — ABNORMAL LOW
TCO2: 23
pCO2, Ven: 39.2 — ABNORMAL LOW
pH, Ven: 7.351 — ABNORMAL HIGH

## 2011-03-01 LAB — COMPREHENSIVE METABOLIC PANEL
ALT: 19
ALT: 20
AST: 24
AST: 41 — ABNORMAL HIGH
Albumin: 3.1 — ABNORMAL LOW
Albumin: 3.6
Alkaline Phosphatase: 51
Alkaline Phosphatase: 86
BUN: 17
BUN: 29 — ABNORMAL HIGH
CO2: 22
CO2: 27
Calcium: 8 — ABNORMAL LOW
Calcium: 9.2
Chloride: 102
Chloride: 98
Creatinine, Ser: 1
Creatinine, Ser: 1.36
GFR calc Af Amer: >60
GFR calc Af Amer: >60
GFR calc non Af Amer: 51 — ABNORMAL LOW
GFR calc non Af Amer: >60
Glucose, Bld: 105 — ABNORMAL HIGH
Glucose, Bld: 168 — ABNORMAL HIGH
Potassium: 4.2
Potassium: 4.9
Sodium: 128 — ABNORMAL LOW
Sodium: 136
Total Bilirubin: 1
Total Bilirubin: 1.1
Total Protein: 5.9 — ABNORMAL LOW
Total Protein: 6.5

## 2011-03-01 LAB — PROTIME-INR
INR: 0.9
INR: 0.9
INR: 1
INR: 1
INR: 1.4
INR: 1.8 — ABNORMAL HIGH
Prothrombin Time: 12.5
Prothrombin Time: 12.7
Prothrombin Time: 13.5
Prothrombin Time: 13.7
Prothrombin Time: 15.5 — ABNORMAL HIGH
Prothrombin Time: 17.9 — ABNORMAL HIGH
Prothrombin Time: 21.3 — ABNORMAL HIGH

## 2011-03-01 LAB — POCT I-STAT 3, ART BLOOD GAS (G3+)
Acid-Base Excess: 2
Acid-base deficit: 2
Acid-base deficit: 3 — ABNORMAL HIGH
Acid-base deficit: 4 — ABNORMAL HIGH
Bicarbonate: 21.4
Bicarbonate: 21.9
Bicarbonate: 24.2 — ABNORMAL HIGH
Bicarbonate: 28 — ABNORMAL HIGH
O2 Saturation: 100
O2 Saturation: 100
O2 Saturation: 95
O2 Saturation: 98
Operator id: 221371
Operator id: 137421
Operator id: 203371
Operator id: 3406
Operator id: 3406
Patient temperature: 36
Patient temperature: 36.8
TCO2: 23
TCO2: 23
TCO2: 25
TCO2: 29
pCO2 arterial: 36
pCO2 arterial: 36.1
pCO2 arterial: 36.9
pCO2 arterial: 48.1 — ABNORMAL HIGH
pH, Arterial: 7.393
pH, Arterial: 7.373
pH, Arterial: 7.378
pH, Arterial: 7.39
pH, Arterial: 7.425
pO2, Arterial: 82
pO2, Arterial: 112 — ABNORMAL HIGH
pO2, Arterial: 276 — ABNORMAL HIGH
pO2, Arterial: 481 — ABNORMAL HIGH
pO2, Arterial: 74 — ABNORMAL LOW

## 2011-03-01 LAB — CLOSTRIDIUM DIFFICILE EIA: C difficile Toxins A+B, EIA: NEGATIVE

## 2011-03-01 LAB — POCT I-STAT GLUCOSE
Glucose, Bld: 354 — ABNORMAL HIGH
Glucose, Bld: 116 — ABNORMAL HIGH
Glucose, Bld: 147 — ABNORMAL HIGH
Operator id: 125961
Operator id: 3406

## 2011-03-01 LAB — POCT I-STAT 3, VENOUS BLOOD GAS (G3P V)
TCO2: 22
pCO2, Ven: 36.1 — ABNORMAL LOW
pH, Ven: 7.374 — ABNORMAL HIGH

## 2011-03-01 LAB — URINE CULTURE
Colony Count: NO GROWTH
Culture: NO GROWTH
Special Requests: NEGATIVE

## 2011-03-01 LAB — CK TOTAL AND CKMB (NOT AT ARMC)
CK, MB: 28.8 — ABNORMAL HIGH
Relative Index: 6.4 — ABNORMAL HIGH
Total CK: 449 — ABNORMAL HIGH

## 2011-03-01 LAB — URINE MICROSCOPIC-ADD ON

## 2011-03-01 LAB — CREATININE, SERUM
Creatinine, Ser: 0.95
GFR calc Af Amer: >60
GFR calc non Af Amer: >60

## 2011-03-01 LAB — MAGNESIUM
Magnesium: 2.5
Magnesium: 2.9 — ABNORMAL HIGH

## 2011-03-01 LAB — TYPE AND SCREEN
ABO/RH(D): O POS
Antibody Screen: NEGATIVE

## 2011-03-01 LAB — HEMOGLOBIN AND HEMATOCRIT, BLOOD
HCT: 23.1 — ABNORMAL LOW
Hemoglobin: 7.9 — CL

## 2011-03-01 LAB — HEMOGLOBIN A1C
Hgb A1c MFr Bld: 7.8 — ABNORMAL HIGH
Mean Plasma Glucose: 200

## 2011-03-01 LAB — PLATELET COUNT: Platelets: 192

## 2011-03-01 LAB — TSH: TSH: 2.294

## 2011-03-01 LAB — APTT
aPTT: 27
aPTT: 35

## 2011-03-06 ENCOUNTER — Encounter: Payer: Self-pay | Admitting: Cardiovascular Disease

## 2011-03-13 ENCOUNTER — Other Ambulatory Visit: Payer: Self-pay | Admitting: *Deleted

## 2011-03-13 DIAGNOSIS — I2510 Atherosclerotic heart disease of native coronary artery without angina pectoris: Secondary | ICD-10-CM

## 2011-03-13 MED ORDER — DOXAZOSIN MESYLATE 2 MG PO TABS: 2.0000 mg | ORAL_TABLET | Freq: Every day | ORAL | Status: DC

## 2011-03-13 NOTE — Telephone Encounter (Signed)
Fax Received. Refill Completed. Joelle Flessner Chowoe (M.A)  

## 2011-03-14 ENCOUNTER — Other Ambulatory Visit: Payer: Self-pay | Admitting: *Deleted

## 2011-05-27 ENCOUNTER — Ambulatory Visit: Payer: Medicare Other | Admitting: Internal Medicine

## 2011-06-11 ENCOUNTER — Ambulatory Visit: Payer: Self-pay | Admitting: Internal Medicine

## 2011-06-25 DIAGNOSIS — H3530 Unspecified macular degeneration: Secondary | ICD-10-CM | POA: Diagnosis not present

## 2011-07-08 DIAGNOSIS — N139 Obstructive and reflux uropathy, unspecified: Secondary | ICD-10-CM | POA: Diagnosis not present

## 2011-07-08 DIAGNOSIS — R351 Nocturia: Secondary | ICD-10-CM | POA: Diagnosis not present

## 2011-07-08 DIAGNOSIS — R339 Retention of urine, unspecified: Secondary | ICD-10-CM | POA: Diagnosis not present

## 2011-07-08 DIAGNOSIS — C61 Malignant neoplasm of prostate: Secondary | ICD-10-CM | POA: Diagnosis not present

## 2011-07-09 DIAGNOSIS — L259 Unspecified contact dermatitis, unspecified cause: Secondary | ICD-10-CM | POA: Diagnosis not present

## 2011-07-11 DIAGNOSIS — D500 Iron deficiency anemia secondary to blood loss (chronic): Secondary | ICD-10-CM | POA: Diagnosis not present

## 2011-07-11 DIAGNOSIS — K5521 Angiodysplasia of colon with hemorrhage: Secondary | ICD-10-CM | POA: Diagnosis not present

## 2011-07-22 DIAGNOSIS — E119 Type 2 diabetes mellitus without complications: Secondary | ICD-10-CM | POA: Diagnosis not present

## 2011-07-22 DIAGNOSIS — B351 Tinea unguium: Secondary | ICD-10-CM | POA: Diagnosis not present

## 2011-08-01 ENCOUNTER — Emergency Department: Payer: Self-pay | Admitting: Emergency Medicine

## 2011-08-01 DIAGNOSIS — R42 Dizziness and giddiness: Secondary | ICD-10-CM | POA: Diagnosis not present

## 2011-08-01 DIAGNOSIS — E1169 Type 2 diabetes mellitus with other specified complication: Secondary | ICD-10-CM | POA: Diagnosis not present

## 2011-08-01 DIAGNOSIS — Z794 Long term (current) use of insulin: Secondary | ICD-10-CM | POA: Diagnosis not present

## 2011-08-01 DIAGNOSIS — Z79899 Other long term (current) drug therapy: Secondary | ICD-10-CM | POA: Diagnosis not present

## 2011-08-01 LAB — URINALYSIS, COMPLETE
Bacteria: NONE SEEN
Bilirubin,UR: NEGATIVE
Blood: NEGATIVE
Ketone: NEGATIVE
Nitrite: NEGATIVE
Ph: 6 (ref 4.5–8.0)
Protein: NEGATIVE
RBC,UR: <1 /HPF (ref 0–5)
Specific Gravity: 1.004 (ref 1.003–1.030)
Squamous Epithelial: NONE SEEN
WBC UR: NONE SEEN /HPF (ref 0–5)

## 2011-08-01 LAB — TROPONIN I: Troponin-I: <0.02 ng/mL

## 2011-08-01 LAB — CBC
HGB: 10.2 g/dL — ABNORMAL LOW (ref 13.0–18.0)
MCH: 29.4 pg (ref 26.0–34.0)
MCV: 88 fL (ref 80–100)
Platelet: 255 10*3/uL (ref 150–440)
RBC: 3.48 10*6/uL — ABNORMAL LOW (ref 4.40–5.90)
WBC: 6.8 10*3/uL (ref 3.8–10.6)

## 2011-08-01 LAB — COMPREHENSIVE METABOLIC PANEL
Albumin: 3.9 g/dL (ref 3.4–5.0)
Anion Gap: 13 (ref 7–16)
BUN: 33 mg/dL — ABNORMAL HIGH (ref 7–18)
Calcium, Total: 9 mg/dL (ref 8.5–10.1)
Chloride: 100 mmol/L (ref 98–107)
Glucose: 74 mg/dL (ref 65–99)
Osmolality: 283 (ref 275–301)
Potassium: 4.9 mmol/L (ref 3.5–5.1)
SGOT(AST): 28 U/L (ref 15–37)
SGPT (ALT): 22 U/L
Sodium: 139 mmol/L (ref 136–145)

## 2011-08-20 DIAGNOSIS — I359 Nonrheumatic aortic valve disorder, unspecified: Secondary | ICD-10-CM | POA: Diagnosis not present

## 2011-08-20 DIAGNOSIS — E1129 Type 2 diabetes mellitus with other diabetic kidney complication: Secondary | ICD-10-CM | POA: Diagnosis not present

## 2011-08-20 DIAGNOSIS — I10 Essential (primary) hypertension: Secondary | ICD-10-CM | POA: Diagnosis not present

## 2011-08-20 DIAGNOSIS — N289 Disorder of kidney and ureter, unspecified: Secondary | ICD-10-CM | POA: Diagnosis not present

## 2011-09-04 ENCOUNTER — Ambulatory Visit: Payer: Self-pay | Admitting: Internal Medicine

## 2011-09-04 DIAGNOSIS — Z8546 Personal history of malignant neoplasm of prostate: Secondary | ICD-10-CM | POA: Diagnosis not present

## 2011-09-04 DIAGNOSIS — I2789 Other specified pulmonary heart diseases: Secondary | ICD-10-CM | POA: Diagnosis not present

## 2011-09-04 DIAGNOSIS — E119 Type 2 diabetes mellitus without complications: Secondary | ICD-10-CM | POA: Diagnosis not present

## 2011-09-04 DIAGNOSIS — K5521 Angiodysplasia of colon with hemorrhage: Secondary | ICD-10-CM | POA: Diagnosis not present

## 2011-09-04 DIAGNOSIS — Z951 Presence of aortocoronary bypass graft: Secondary | ICD-10-CM | POA: Diagnosis not present

## 2011-09-04 DIAGNOSIS — I129 Hypertensive chronic kidney disease with stage 1 through stage 4 chronic kidney disease, or unspecified chronic kidney disease: Secondary | ICD-10-CM | POA: Diagnosis not present

## 2011-09-04 DIAGNOSIS — Z794 Long term (current) use of insulin: Secondary | ICD-10-CM | POA: Diagnosis not present

## 2011-09-04 DIAGNOSIS — D509 Iron deficiency anemia, unspecified: Secondary | ICD-10-CM | POA: Diagnosis not present

## 2011-09-04 DIAGNOSIS — D649 Anemia, unspecified: Secondary | ICD-10-CM | POA: Diagnosis not present

## 2011-09-04 DIAGNOSIS — N189 Chronic kidney disease, unspecified: Secondary | ICD-10-CM | POA: Diagnosis not present

## 2011-09-04 DIAGNOSIS — Z79899 Other long term (current) drug therapy: Secondary | ICD-10-CM | POA: Diagnosis not present

## 2011-09-04 DIAGNOSIS — Z954 Presence of other heart-valve replacement: Secondary | ICD-10-CM | POA: Diagnosis not present

## 2011-09-04 LAB — CBC CANCER CENTER
Basophil #: 0.1 x10 3/mm (ref 0.0–0.1)
Basophil %: 0.9 %
Eosinophil #: 0.3 x10 3/mm (ref 0.0–0.7)
HCT: 30 % — ABNORMAL LOW (ref 40.0–52.0)
HGB: 10.2 g/dL — ABNORMAL LOW (ref 13.0–18.0)
Lymphocyte #: 1 x10 3/mm (ref 1.0–3.6)
Lymphocyte %: 16.6 %
MCHC: 34.1 g/dL (ref 32.0–36.0)
MCV: 87 fL (ref 80–100)
Monocyte #: 0.5 x10 3/mm (ref 0.0–0.7)
Monocyte %: 8.1 %
Neutrophil #: 4.1 x10 3/mm (ref 1.4–6.5)
Platelet: 253 x10 3/mm (ref 150–440)

## 2011-09-04 LAB — IRON AND TIBC
Iron Bind.Cap.(Total): 306 ug/dL (ref 250–450)
Iron: 77 ug/dL (ref 65–175)
Unbound Iron-Bind.Cap.: 229 ug/dL

## 2011-09-04 LAB — RETICULOCYTES: Absolute Retic Count: 0.037 10*6/uL (ref 0.024–0.084)

## 2011-09-05 ENCOUNTER — Other Ambulatory Visit: Payer: Self-pay

## 2011-09-05 ENCOUNTER — Encounter: Payer: Self-pay | Admitting: Cardiology

## 2011-09-05 ENCOUNTER — Other Ambulatory Visit: Payer: Self-pay | Admitting: *Deleted

## 2011-09-05 ENCOUNTER — Ambulatory Visit (INDEPENDENT_AMBULATORY_CARE_PROVIDER_SITE_OTHER): Payer: Medicare Other | Admitting: Cardiology

## 2011-09-05 VITALS — BP 170/62 | HR 67 | Ht 71.0 in | Wt 243.0 lb

## 2011-09-05 DIAGNOSIS — Z951 Presence of aortocoronary bypass graft: Secondary | ICD-10-CM

## 2011-09-05 DIAGNOSIS — Z952 Presence of prosthetic heart valve: Secondary | ICD-10-CM

## 2011-09-05 DIAGNOSIS — R001 Bradycardia, unspecified: Secondary | ICD-10-CM

## 2011-09-05 DIAGNOSIS — I359 Nonrheumatic aortic valve disorder, unspecified: Secondary | ICD-10-CM

## 2011-09-05 DIAGNOSIS — Z954 Presence of other heart-valve replacement: Secondary | ICD-10-CM

## 2011-09-05 DIAGNOSIS — I10 Essential (primary) hypertension: Secondary | ICD-10-CM

## 2011-09-05 DIAGNOSIS — I498 Other specified cardiac arrhythmias: Secondary | ICD-10-CM

## 2011-09-05 DIAGNOSIS — I350 Nonrheumatic aortic (valve) stenosis: Secondary | ICD-10-CM

## 2011-09-05 DIAGNOSIS — I441 Atrioventricular block, second degree: Secondary | ICD-10-CM

## 2011-09-05 MED ORDER — AMLODIPINE BESYLATE 5 MG PO TABS: 5.0000 mg | ORAL_TABLET | Freq: Every day | ORAL | Status: DC
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
## 2011-09-05 NOTE — Assessment & Plan Note (Signed)
He is status post aortic valve replacement with a #25 mm pericardial valve. He remains asymptomatic. His exam is good.

## 2011-09-05 NOTE — Assessment & Plan Note (Signed)
His junctional bradycardia has resolved with stopping his beta blocker. He does have Wenkebach block but this is asymptomatic. His rate is satisfactory at 70 beats per minute.

## 2011-09-05 NOTE — Patient Instructions (Signed)
Continue your current therapy  I will see you again in 6 months.   

## 2011-09-05 NOTE — Assessment & Plan Note (Signed)
Blood pressure is elevated today but his readings at home have been satisfactory. We will continue to monitor.

## 2011-09-09 ENCOUNTER — Ambulatory Visit: Payer: Self-pay | Admitting: Internal Medicine

## 2011-09-11 ENCOUNTER — Other Ambulatory Visit: Payer: Self-pay | Admitting: *Deleted

## 2011-09-11 ENCOUNTER — Other Ambulatory Visit: Payer: Self-pay

## 2011-09-11 DIAGNOSIS — I2510 Atherosclerotic heart disease of native coronary artery without angina pectoris: Secondary | ICD-10-CM

## 2011-09-11 MED ORDER — DOXAZOSIN MESYLATE 2 MG PO TABS: 2.0000 mg | ORAL_TABLET | Freq: Every day | ORAL | Status: DC

## 2011-09-11 NOTE — Telephone Encounter (Signed)
Fax Received. Refill Completed. Alwin Lanigan Chowoe (R.M.A)   

## 2011-10-02 DIAGNOSIS — E119 Type 2 diabetes mellitus without complications: Secondary | ICD-10-CM | POA: Diagnosis not present

## 2011-10-02 DIAGNOSIS — J449 Chronic obstructive pulmonary disease, unspecified: Secondary | ICD-10-CM | POA: Diagnosis not present

## 2011-10-02 DIAGNOSIS — I10 Essential (primary) hypertension: Secondary | ICD-10-CM | POA: Diagnosis not present

## 2011-10-02 DIAGNOSIS — J209 Acute bronchitis, unspecified: Secondary | ICD-10-CM | POA: Diagnosis not present

## 2011-10-08 DIAGNOSIS — L570 Actinic keratosis: Secondary | ICD-10-CM | POA: Diagnosis not present

## 2011-10-08 DIAGNOSIS — L578 Other skin changes due to chronic exposure to nonionizing radiation: Secondary | ICD-10-CM | POA: Diagnosis not present

## 2011-10-08 DIAGNOSIS — L259 Unspecified contact dermatitis, unspecified cause: Secondary | ICD-10-CM | POA: Diagnosis not present

## 2011-10-25 DIAGNOSIS — B351 Tinea unguium: Secondary | ICD-10-CM | POA: Diagnosis not present

## 2011-10-25 DIAGNOSIS — L851 Acquired keratosis [keratoderma] palmaris et plantaris: Secondary | ICD-10-CM | POA: Diagnosis not present

## 2011-10-25 DIAGNOSIS — E119 Type 2 diabetes mellitus without complications: Secondary | ICD-10-CM | POA: Diagnosis not present

## 2011-11-14 DIAGNOSIS — D649 Anemia, unspecified: Secondary | ICD-10-CM | POA: Diagnosis not present

## 2011-11-20 DIAGNOSIS — D649 Anemia, unspecified: Secondary | ICD-10-CM | POA: Diagnosis not present

## 2011-11-20 DIAGNOSIS — N289 Disorder of kidney and ureter, unspecified: Secondary | ICD-10-CM | POA: Diagnosis not present

## 2011-11-20 DIAGNOSIS — E1165 Type 2 diabetes mellitus with hyperglycemia: Secondary | ICD-10-CM | POA: Diagnosis not present

## 2011-11-20 DIAGNOSIS — E1129 Type 2 diabetes mellitus with other diabetic kidney complication: Secondary | ICD-10-CM | POA: Diagnosis not present

## 2011-11-20 DIAGNOSIS — I10 Essential (primary) hypertension: Secondary | ICD-10-CM | POA: Diagnosis not present

## 2011-12-03 DIAGNOSIS — R05 Cough: Secondary | ICD-10-CM | POA: Diagnosis not present

## 2011-12-13 DIAGNOSIS — Z79899 Other long term (current) drug therapy: Secondary | ICD-10-CM | POA: Diagnosis not present

## 2011-12-13 DIAGNOSIS — R17 Unspecified jaundice: Secondary | ICD-10-CM | POA: Diagnosis not present

## 2011-12-13 DIAGNOSIS — R252 Cramp and spasm: Secondary | ICD-10-CM | POA: Diagnosis not present

## 2011-12-13 DIAGNOSIS — D649 Anemia, unspecified: Secondary | ICD-10-CM | POA: Diagnosis not present

## 2011-12-13 DIAGNOSIS — N289 Disorder of kidney and ureter, unspecified: Secondary | ICD-10-CM | POA: Diagnosis not present

## 2011-12-18 ENCOUNTER — Ambulatory Visit (INDEPENDENT_AMBULATORY_CARE_PROVIDER_SITE_OTHER): Payer: Medicare Other | Admitting: Cardiovascular Disease

## 2011-12-18 ENCOUNTER — Telehealth: Payer: Self-pay

## 2011-12-18 VITALS — BP 130/60 | HR 100 | Ht 71.0 in | Wt 247.0 lb

## 2011-12-18 DIAGNOSIS — Z9189 Other specified personal risk factors, not elsewhere classified: Secondary | ICD-10-CM | POA: Diagnosis not present

## 2011-12-18 DIAGNOSIS — R531 Weakness: Secondary | ICD-10-CM

## 2011-12-18 DIAGNOSIS — R5381 Other malaise: Secondary | ICD-10-CM | POA: Diagnosis not present

## 2011-12-18 DIAGNOSIS — R5383 Other fatigue: Secondary | ICD-10-CM

## 2011-12-18 DIAGNOSIS — K922 Gastrointestinal hemorrhage, unspecified: Secondary | ICD-10-CM | POA: Diagnosis not present

## 2011-12-18 DIAGNOSIS — Z9289 Personal history of other medical treatment: Secondary | ICD-10-CM

## 2011-12-18 NOTE — Telephone Encounter (Signed)
Received phone call from patient this morning he stated he has had fast heart beat.States last night pulse 108 beats/min.Stated he felt bad all day yesterday.Stated he called his son Dr.Minch and he advised to come to office for a ekg.Patient wants to go to the [place] office because he lives in [place].Spoke to Tarkio in the [place] office she advised to have patient come at 2:00 pm today,they will fax ekg to Dr.Jordan.

## 2011-12-18 NOTE — Progress Notes (Signed)
Pt worked in for EKG per Dr. Swaziland for generalized weakness and "not feeling well". He denies sob, cp or dizziness.   EKG performed.  Unchanged from previous EKG. Still shows wide QRS and LBBB. Today he is tachycardic with a rate of 100 BPM Discussed findings with Dr. Swaziland and Dr. Elease Hashimoto. They both gave orders for BMP, CBC and LFTs

## 2011-12-18 NOTE — Patient Instructions (Addendum)
We will check labs today and call you with results.

## 2011-12-19 ENCOUNTER — Telehealth: Payer: Self-pay | Admitting: Cardiology

## 2011-12-19 ENCOUNTER — Other Ambulatory Visit: Payer: Self-pay

## 2011-12-19 DIAGNOSIS — I10 Essential (primary) hypertension: Secondary | ICD-10-CM

## 2011-12-19 LAB — BASIC METABOLIC PANEL
BUN: 33 mg/dL — ABNORMAL HIGH (ref 8–27)
CO2: 22 mmol/L (ref 19–28)
Calcium: 9 mg/dL (ref 8.6–10.2)
Creatinine, Ser: 1.39 mg/dL — ABNORMAL HIGH (ref 0.76–1.27)
GFR calc non Af Amer: 47 mL/min/{1.73_m2} — ABNORMAL LOW (ref >59)
Glucose: 154 mg/dL — ABNORMAL HIGH (ref 65–99)

## 2011-12-19 LAB — CBC WITH DIFFERENTIAL/PLATELET
Basophils Absolute: 0 10*3/uL (ref 0.0–0.2)
Basos: 1 % (ref 0–3)
Eos: 5 % (ref 0–7)
Hemoglobin: 10 g/dL — ABNORMAL LOW (ref 12.6–17.7)
Lymphs: 15 % (ref 14–46)
MCHC: 33.4 g/dL (ref 31.5–35.7)
Monocytes: 8 % (ref 4–13)
Neutrophils Absolute: 4.5 10*3/uL (ref 1.8–7.8)
RBC: 3.36 x10E6/uL — ABNORMAL LOW (ref 4.14–5.80)
WBC: 6.2 10*3/uL (ref 4.0–10.5)

## 2011-12-19 LAB — HEPATIC FUNCTION PANEL
Albumin: 4.2 g/dL (ref 3.5–4.7)
Alkaline Phosphatase: 76 IU/L (ref 25–160)
Total Bilirubin: 0.4 mg/dL (ref 0.0–1.2)
Total Protein: 6.6 g/dL (ref 6.0–8.5)

## 2011-12-19 NOTE — Telephone Encounter (Signed)
New msg Pt's wife called about test results

## 2011-12-19 NOTE — Telephone Encounter (Signed)
Patient called was given lab results.Advised to hold lasix,avoid foods high in potassium.Repeat bmet in 1 week.

## 2011-12-24 DIAGNOSIS — H3530 Unspecified macular degeneration: Secondary | ICD-10-CM | POA: Diagnosis not present

## 2011-12-25 ENCOUNTER — Ambulatory Visit: Payer: Self-pay | Admitting: Internal Medicine

## 2011-12-25 DIAGNOSIS — N039 Chronic nephritic syndrome with unspecified morphologic changes: Secondary | ICD-10-CM | POA: Diagnosis not present

## 2011-12-25 DIAGNOSIS — I2789 Other specified pulmonary heart diseases: Secondary | ICD-10-CM | POA: Diagnosis not present

## 2011-12-25 DIAGNOSIS — Z8546 Personal history of malignant neoplasm of prostate: Secondary | ICD-10-CM | POA: Diagnosis not present

## 2011-12-25 DIAGNOSIS — Z794 Long term (current) use of insulin: Secondary | ICD-10-CM | POA: Diagnosis not present

## 2011-12-25 DIAGNOSIS — Z951 Presence of aortocoronary bypass graft: Secondary | ICD-10-CM | POA: Diagnosis not present

## 2011-12-25 DIAGNOSIS — Z954 Presence of other heart-valve replacement: Secondary | ICD-10-CM | POA: Diagnosis not present

## 2011-12-25 DIAGNOSIS — I129 Hypertensive chronic kidney disease with stage 1 through stage 4 chronic kidney disease, or unspecified chronic kidney disease: Secondary | ICD-10-CM | POA: Diagnosis not present

## 2011-12-25 DIAGNOSIS — D631 Anemia in chronic kidney disease: Secondary | ICD-10-CM | POA: Diagnosis not present

## 2011-12-25 DIAGNOSIS — N189 Chronic kidney disease, unspecified: Secondary | ICD-10-CM | POA: Diagnosis not present

## 2011-12-25 DIAGNOSIS — E119 Type 2 diabetes mellitus without complications: Secondary | ICD-10-CM | POA: Diagnosis not present

## 2011-12-25 DIAGNOSIS — Z79899 Other long term (current) drug therapy: Secondary | ICD-10-CM | POA: Diagnosis not present

## 2011-12-25 LAB — CBC CANCER CENTER
Basophil #: 0 x10 3/mm (ref 0.0–0.1)
Eosinophil #: 0.3 x10 3/mm (ref 0.0–0.7)
HCT: 28.1 % — ABNORMAL LOW (ref 40.0–52.0)
HGB: 9.2 g/dL — ABNORMAL LOW (ref 13.0–18.0)
Lymphocyte #: 1 x10 3/mm (ref 1.0–3.6)
MCHC: 32.7 g/dL (ref 32.0–36.0)
MCV: 91 fL (ref 80–100)
Monocyte #: 0.6 x10 3/mm (ref 0.2–1.0)
RDW: 13.6 % (ref 11.5–14.5)
WBC: 6 x10 3/mm (ref 3.8–10.6)

## 2011-12-25 LAB — IRON AND TIBC
Iron Bind.Cap.(Total): 314 ug/dL (ref 250–450)
Iron Saturation: 38 %
Iron: 118 ug/dL (ref 65–175)
Unbound Iron-Bind.Cap.: 196 ug/dL

## 2011-12-27 ENCOUNTER — Other Ambulatory Visit (INDEPENDENT_AMBULATORY_CARE_PROVIDER_SITE_OTHER): Payer: Medicare Other

## 2011-12-27 DIAGNOSIS — I10 Essential (primary) hypertension: Secondary | ICD-10-CM | POA: Diagnosis not present

## 2011-12-27 LAB — BASIC METABOLIC PANEL
BUN: 39 mg/dL — ABNORMAL HIGH (ref 6–23)
GFR: 44.8 mL/min — ABNORMAL LOW (ref >60.00)
Glucose, Bld: 192 mg/dL — ABNORMAL HIGH (ref 70–99)
Potassium: 4.9 mEq/L (ref 3.5–5.1)

## 2011-12-30 ENCOUNTER — Telehealth: Payer: Self-pay | Admitting: Cardiology

## 2011-12-30 DIAGNOSIS — Z954 Presence of other heart-valve replacement: Secondary | ICD-10-CM | POA: Diagnosis not present

## 2011-12-30 DIAGNOSIS — I129 Hypertensive chronic kidney disease with stage 1 through stage 4 chronic kidney disease, or unspecified chronic kidney disease: Secondary | ICD-10-CM | POA: Diagnosis not present

## 2011-12-30 DIAGNOSIS — Z951 Presence of aortocoronary bypass graft: Secondary | ICD-10-CM | POA: Diagnosis not present

## 2011-12-30 DIAGNOSIS — D631 Anemia in chronic kidney disease: Secondary | ICD-10-CM | POA: Diagnosis not present

## 2011-12-30 DIAGNOSIS — N039 Chronic nephritic syndrome with unspecified morphologic changes: Secondary | ICD-10-CM | POA: Diagnosis not present

## 2011-12-30 DIAGNOSIS — E119 Type 2 diabetes mellitus without complications: Secondary | ICD-10-CM | POA: Diagnosis not present

## 2011-12-30 DIAGNOSIS — N189 Chronic kidney disease, unspecified: Secondary | ICD-10-CM | POA: Diagnosis not present

## 2011-12-30 NOTE — Telephone Encounter (Signed)
Walk in pt Form " Pt has Questions about Meds" sent to [facility]  12/30/11/KM

## 2011-12-31 ENCOUNTER — Other Ambulatory Visit: Payer: Self-pay

## 2011-12-31 DIAGNOSIS — I10 Essential (primary) hypertension: Secondary | ICD-10-CM | POA: Diagnosis not present

## 2012-01-02 ENCOUNTER — Telehealth: Payer: Self-pay

## 2012-01-02 NOTE — Telephone Encounter (Signed)
Patient called was told may have bmet done in [place] office 01/14/12.Patient was told to call [place] office the day before you go so they can put you on the schedule.

## 2012-01-07 DIAGNOSIS — N039 Chronic nephritic syndrome with unspecified morphologic changes: Secondary | ICD-10-CM | POA: Diagnosis not present

## 2012-01-07 DIAGNOSIS — N189 Chronic kidney disease, unspecified: Secondary | ICD-10-CM | POA: Diagnosis not present

## 2012-01-07 DIAGNOSIS — I129 Hypertensive chronic kidney disease with stage 1 through stage 4 chronic kidney disease, or unspecified chronic kidney disease: Secondary | ICD-10-CM | POA: Diagnosis not present

## 2012-01-07 DIAGNOSIS — Z951 Presence of aortocoronary bypass graft: Secondary | ICD-10-CM | POA: Diagnosis not present

## 2012-01-07 DIAGNOSIS — E119 Type 2 diabetes mellitus without complications: Secondary | ICD-10-CM | POA: Diagnosis not present

## 2012-01-07 DIAGNOSIS — Z954 Presence of other heart-valve replacement: Secondary | ICD-10-CM | POA: Diagnosis not present

## 2012-01-07 LAB — CANCER CENTER HEMOGLOBIN: HGB: 10.1 g/dL — ABNORMAL LOW (ref 13.0–18.0)

## 2012-01-09 ENCOUNTER — Ambulatory Visit: Payer: Self-pay | Admitting: Internal Medicine

## 2012-01-09 DIAGNOSIS — I2789 Other specified pulmonary heart diseases: Secondary | ICD-10-CM | POA: Diagnosis not present

## 2012-01-09 DIAGNOSIS — Z79899 Other long term (current) drug therapy: Secondary | ICD-10-CM | POA: Diagnosis not present

## 2012-01-09 DIAGNOSIS — Z87891 Personal history of nicotine dependence: Secondary | ICD-10-CM | POA: Diagnosis not present

## 2012-01-09 DIAGNOSIS — Z954 Presence of other heart-valve replacement: Secondary | ICD-10-CM | POA: Diagnosis not present

## 2012-01-09 DIAGNOSIS — Z794 Long term (current) use of insulin: Secondary | ICD-10-CM | POA: Diagnosis not present

## 2012-01-09 DIAGNOSIS — I129 Hypertensive chronic kidney disease with stage 1 through stage 4 chronic kidney disease, or unspecified chronic kidney disease: Secondary | ICD-10-CM | POA: Diagnosis not present

## 2012-01-09 DIAGNOSIS — D631 Anemia in chronic kidney disease: Secondary | ICD-10-CM | POA: Diagnosis not present

## 2012-01-09 DIAGNOSIS — N189 Chronic kidney disease, unspecified: Secondary | ICD-10-CM | POA: Diagnosis not present

## 2012-01-09 DIAGNOSIS — E119 Type 2 diabetes mellitus without complications: Secondary | ICD-10-CM | POA: Diagnosis not present

## 2012-01-09 DIAGNOSIS — Z8546 Personal history of malignant neoplasm of prostate: Secondary | ICD-10-CM | POA: Diagnosis not present

## 2012-01-09 DIAGNOSIS — I519 Heart disease, unspecified: Secondary | ICD-10-CM | POA: Diagnosis not present

## 2012-01-09 DIAGNOSIS — Z951 Presence of aortocoronary bypass graft: Secondary | ICD-10-CM | POA: Diagnosis not present

## 2012-01-10 DIAGNOSIS — H903 Sensorineural hearing loss, bilateral: Secondary | ICD-10-CM | POA: Diagnosis not present

## 2012-01-10 DIAGNOSIS — H6120 Impacted cerumen, unspecified ear: Secondary | ICD-10-CM | POA: Diagnosis not present

## 2012-01-13 ENCOUNTER — Ambulatory Visit (INDEPENDENT_AMBULATORY_CARE_PROVIDER_SITE_OTHER): Payer: Medicare Other

## 2012-01-13 DIAGNOSIS — I10 Essential (primary) hypertension: Secondary | ICD-10-CM | POA: Diagnosis not present

## 2012-01-14 LAB — BASIC METABOLIC PANEL
GFR calc Af Amer: 47 mL/min/{1.73_m2} — ABNORMAL LOW (ref >59)
GFR calc non Af Amer: 40 mL/min/{1.73_m2} — ABNORMAL LOW (ref >59)
Potassium: 5.1 mmol/L (ref 3.5–5.2)
Sodium: 135 mmol/L (ref 134–144)

## 2012-01-15 ENCOUNTER — Other Ambulatory Visit: Payer: Self-pay

## 2012-01-15 DIAGNOSIS — R531 Weakness: Secondary | ICD-10-CM

## 2012-01-15 DIAGNOSIS — R5383 Other fatigue: Secondary | ICD-10-CM

## 2012-01-15 DIAGNOSIS — R899 Unspecified abnormal finding in specimens from other organs, systems and tissues: Secondary | ICD-10-CM

## 2012-01-15 DIAGNOSIS — Z951 Presence of aortocoronary bypass graft: Secondary | ICD-10-CM

## 2012-01-15 DIAGNOSIS — R7989 Other specified abnormal findings of blood chemistry: Secondary | ICD-10-CM

## 2012-01-15 DIAGNOSIS — R799 Abnormal finding of blood chemistry, unspecified: Secondary | ICD-10-CM

## 2012-01-15 DIAGNOSIS — Z952 Presence of prosthetic heart valve: Secondary | ICD-10-CM

## 2012-01-15 DIAGNOSIS — N19 Unspecified kidney failure: Secondary | ICD-10-CM

## 2012-01-15 DIAGNOSIS — I10 Essential (primary) hypertension: Secondary | ICD-10-CM

## 2012-01-16 DIAGNOSIS — N139 Obstructive and reflux uropathy, unspecified: Secondary | ICD-10-CM | POA: Diagnosis not present

## 2012-01-16 DIAGNOSIS — C61 Malignant neoplasm of prostate: Secondary | ICD-10-CM | POA: Diagnosis not present

## 2012-01-16 DIAGNOSIS — R351 Nocturia: Secondary | ICD-10-CM | POA: Diagnosis not present

## 2012-01-16 DIAGNOSIS — R339 Retention of urine, unspecified: Secondary | ICD-10-CM | POA: Diagnosis not present

## 2012-01-17 DIAGNOSIS — B351 Tinea unguium: Secondary | ICD-10-CM | POA: Diagnosis not present

## 2012-01-17 DIAGNOSIS — E119 Type 2 diabetes mellitus without complications: Secondary | ICD-10-CM | POA: Diagnosis not present

## 2012-01-20 DIAGNOSIS — IMO0002 Reserved for concepts with insufficient information to code with codable children: Secondary | ICD-10-CM | POA: Diagnosis not present

## 2012-01-21 LAB — CANCER CENTER HEMOGLOBIN: HGB: 10 g/dL — ABNORMAL LOW (ref 13.0–18.0)

## 2012-01-28 ENCOUNTER — Encounter: Payer: Self-pay | Admitting: *Deleted

## 2012-01-30 ENCOUNTER — Ambulatory Visit (HOSPITAL_COMMUNITY)
Admission: RE | Admit: 2012-01-30 | Discharge: 2012-01-30 | Disposition: A | Discharge status: Home / Self Care | Payer: Medicare Other | Source: Ambulatory Visit | Attending: Cardiology | Admitting: Cardiology

## 2012-01-30 DIAGNOSIS — N281 Cyst of kidney, acquired: Secondary | ICD-10-CM | POA: Insufficient documentation

## 2012-01-30 DIAGNOSIS — N19 Unspecified kidney failure: Secondary | ICD-10-CM | POA: Diagnosis not present

## 2012-02-04 DIAGNOSIS — N039 Chronic nephritic syndrome with unspecified morphologic changes: Secondary | ICD-10-CM | POA: Diagnosis not present

## 2012-02-04 DIAGNOSIS — N189 Chronic kidney disease, unspecified: Secondary | ICD-10-CM | POA: Diagnosis not present

## 2012-02-04 LAB — CBC CANCER CENTER
Basophil #: 0.1 x10 3/mm (ref 0.0–0.1)
Basophil %: 0.9 %
Eosinophil #: 0.4 x10 3/mm (ref 0.0–0.7)
HCT: 33 % — ABNORMAL LOW (ref 40.0–52.0)
HGB: 10.5 g/dL — ABNORMAL LOW (ref 13.0–18.0)
MCH: 28.5 pg (ref 26.0–34.0)
MCHC: 31.8 g/dL — ABNORMAL LOW (ref 32.0–36.0)
Monocyte #: 0.7 x10 3/mm (ref 0.2–1.0)
Neutrophil #: 4.5 x10 3/mm (ref 1.4–6.5)
RDW: 13.7 % (ref 11.5–14.5)

## 2012-02-06 ENCOUNTER — Telehealth: Payer: Self-pay | Admitting: Cardiovascular Disease

## 2012-02-06 DIAGNOSIS — E878 Other disorders of electrolyte and fluid balance, not elsewhere classified: Secondary | ICD-10-CM | POA: Diagnosis not present

## 2012-02-06 DIAGNOSIS — I10 Essential (primary) hypertension: Secondary | ICD-10-CM | POA: Diagnosis not present

## 2012-02-06 NOTE — Telephone Encounter (Signed)
Spoke to patient's wife was told patient still needs to decrease potassium in his diet.

## 2012-02-06 NOTE — Telephone Encounter (Signed)
Pt needs to know when he can eat food with patassium in it

## 2012-02-09 ENCOUNTER — Ambulatory Visit: Payer: Self-pay | Admitting: Internal Medicine

## 2012-02-13 ENCOUNTER — Institutional Professional Consult (permissible substitution): Payer: Medicare Other | Admitting: Cardiovascular Disease

## 2012-02-13 DIAGNOSIS — I10 Essential (primary) hypertension: Secondary | ICD-10-CM | POA: Diagnosis not present

## 2012-02-18 DIAGNOSIS — E119 Type 2 diabetes mellitus without complications: Secondary | ICD-10-CM | POA: Diagnosis not present

## 2012-02-27 DIAGNOSIS — Z23 Encounter for immunization: Secondary | ICD-10-CM | POA: Diagnosis not present

## 2012-03-12 ENCOUNTER — Other Ambulatory Visit: Payer: Self-pay | Admitting: Cardiology

## 2012-03-12 DIAGNOSIS — I2510 Atherosclerotic heart disease of native coronary artery without angina pectoris: Secondary | ICD-10-CM

## 2012-03-12 MED ORDER — DOXAZOSIN MESYLATE 2 MG PO TABS: 2.0000 mg | ORAL_TABLET | Freq: Every day | ORAL | Status: DC

## 2012-03-20 ENCOUNTER — Encounter: Payer: Self-pay | Admitting: Cardiology

## 2012-03-23 ENCOUNTER — Encounter: Payer: Self-pay | Admitting: Cardiology

## 2012-03-24 ENCOUNTER — Ambulatory Visit (INDEPENDENT_AMBULATORY_CARE_PROVIDER_SITE_OTHER): Payer: Medicare Other | Admitting: Cardiology

## 2012-03-24 ENCOUNTER — Encounter: Payer: Self-pay | Admitting: Cardiology

## 2012-03-24 VITALS — BP 152/78 | HR 75 | Ht 71.0 in | Wt 247.0 lb

## 2012-03-24 DIAGNOSIS — I498 Other specified cardiac arrhythmias: Secondary | ICD-10-CM

## 2012-03-24 DIAGNOSIS — I2510 Atherosclerotic heart disease of native coronary artery without angina pectoris: Secondary | ICD-10-CM

## 2012-03-24 DIAGNOSIS — Z954 Presence of other heart-valve replacement: Secondary | ICD-10-CM

## 2012-03-24 DIAGNOSIS — R001 Bradycardia, unspecified: Secondary | ICD-10-CM

## 2012-03-24 DIAGNOSIS — Z952 Presence of prosthetic heart valve: Secondary | ICD-10-CM

## 2012-03-24 DIAGNOSIS — Z953 Presence of xenogenic heart valve: Secondary | ICD-10-CM | POA: Insufficient documentation

## 2012-03-24 DIAGNOSIS — I10 Essential (primary) hypertension: Secondary | ICD-10-CM
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
## 2012-03-24 NOTE — Patient Instructions (Signed)
Continue your current medication  Watch your pulse rate. If you get readings less than 50 let me know.  I will see you again in 6 months.

## 2012-04-07 DIAGNOSIS — L821 Other seborrheic keratosis: Secondary | ICD-10-CM | POA: Diagnosis not present

## 2012-04-07 DIAGNOSIS — Z85828 Personal history of other malignant neoplasm of skin: Secondary | ICD-10-CM | POA: Diagnosis not present

## 2012-04-07 DIAGNOSIS — D1800 Hemangioma unspecified site: Secondary | ICD-10-CM | POA: Diagnosis not present

## 2012-04-07 DIAGNOSIS — L820 Inflamed seborrheic keratosis: Secondary | ICD-10-CM | POA: Diagnosis not present

## 2012-04-07 DIAGNOSIS — L570 Actinic keratosis: Secondary | ICD-10-CM | POA: Diagnosis not present

## 2012-04-07 DIAGNOSIS — L578 Other skin changes due to chronic exposure to nonionizing radiation: Secondary | ICD-10-CM | POA: Diagnosis not present

## 2012-04-10 DIAGNOSIS — B351 Tinea unguium: Secondary | ICD-10-CM | POA: Diagnosis not present

## 2012-04-10 DIAGNOSIS — E119 Type 2 diabetes mellitus without complications: Secondary | ICD-10-CM | POA: Diagnosis not present

## 2012-04-15 ENCOUNTER — Ambulatory Visit: Payer: Self-pay | Admitting: Internal Medicine

## 2012-04-15 DIAGNOSIS — D631 Anemia in chronic kidney disease: Secondary | ICD-10-CM | POA: Diagnosis not present

## 2012-04-15 DIAGNOSIS — N189 Chronic kidney disease, unspecified: Secondary | ICD-10-CM | POA: Diagnosis not present

## 2012-04-15 LAB — CBC CANCER CENTER
Lymphocyte #: 1.1 x10 3/mm (ref 1.0–3.6)
Lymphocyte %: 16.7 %
MCHC: 32.3 g/dL (ref 32.0–36.0)
Monocyte #: 0.7 x10 3/mm (ref 0.2–1.0)
Neutrophil #: 4.4 x10 3/mm (ref 1.4–6.5)
Neutrophil %: 65 %
RBC: 3.8 10*6/uL — ABNORMAL LOW (ref 4.40–5.90)
RDW: 14.8 % — ABNORMAL HIGH (ref 11.5–14.5)

## 2012-04-15 LAB — IRON AND TIBC
Iron Bind.Cap.(Total): 312 ug/dL (ref 250–450)
Iron: 63 ug/dL — ABNORMAL LOW (ref 65–175)
Unbound Iron-Bind.Cap.: 249 ug/dL

## 2012-04-15 LAB — RETICULOCYTES: Reticulocyte: 0.83 % (ref 0.7–2.5)

## 2012-04-15 LAB — FERRITIN: Ferritin (ARMC): 50 ng/mL (ref 8–388)

## 2012-05-10 ENCOUNTER — Ambulatory Visit: Payer: Self-pay | Admitting: Internal Medicine

## 2012-05-10 DIAGNOSIS — E119 Type 2 diabetes mellitus without complications: Secondary | ICD-10-CM | POA: Diagnosis not present

## 2012-05-10 DIAGNOSIS — M129 Arthropathy, unspecified: Secondary | ICD-10-CM | POA: Diagnosis not present

## 2012-05-10 DIAGNOSIS — Z79899 Other long term (current) drug therapy: Secondary | ICD-10-CM | POA: Diagnosis not present

## 2012-05-10 DIAGNOSIS — Z8546 Personal history of malignant neoplasm of prostate: Secondary | ICD-10-CM | POA: Diagnosis not present

## 2012-05-10 DIAGNOSIS — Z951 Presence of aortocoronary bypass graft: Secondary | ICD-10-CM | POA: Diagnosis not present

## 2012-05-10 DIAGNOSIS — I129 Hypertensive chronic kidney disease with stage 1 through stage 4 chronic kidney disease, or unspecified chronic kidney disease: Secondary | ICD-10-CM | POA: Diagnosis not present

## 2012-05-10 DIAGNOSIS — D509 Iron deficiency anemia, unspecified: Secondary | ICD-10-CM | POA: Diagnosis not present

## 2012-05-10 DIAGNOSIS — Z87891 Personal history of nicotine dependence: Secondary | ICD-10-CM | POA: Diagnosis not present

## 2012-05-10 DIAGNOSIS — Z794 Long term (current) use of insulin: Secondary | ICD-10-CM | POA: Diagnosis not present

## 2012-05-10 DIAGNOSIS — D631 Anemia in chronic kidney disease: Secondary | ICD-10-CM | POA: Diagnosis not present

## 2012-05-10 DIAGNOSIS — N189 Chronic kidney disease, unspecified: Secondary | ICD-10-CM | POA: Diagnosis not present

## 2012-05-10 DIAGNOSIS — I2789 Other specified pulmonary heart diseases: Secondary | ICD-10-CM | POA: Diagnosis not present

## 2012-05-10 DIAGNOSIS — Z8719 Personal history of other diseases of the digestive system: Secondary | ICD-10-CM | POA: Diagnosis not present

## 2012-05-26 DIAGNOSIS — D509 Iron deficiency anemia, unspecified: Secondary | ICD-10-CM | POA: Diagnosis not present

## 2012-05-26 DIAGNOSIS — N189 Chronic kidney disease, unspecified: Secondary | ICD-10-CM | POA: Diagnosis not present

## 2012-05-26 DIAGNOSIS — D631 Anemia in chronic kidney disease: Secondary | ICD-10-CM | POA: Diagnosis not present

## 2012-05-26 DIAGNOSIS — Z8719 Personal history of other diseases of the digestive system: Secondary | ICD-10-CM | POA: Diagnosis not present

## 2012-05-26 LAB — CBC CANCER CENTER
Basophil #: 0.1 x10 3/mm (ref 0.0–0.1)
Basophil %: 1.2 %
Eosinophil #: 0.4 x10 3/mm (ref 0.0–0.7)
Eosinophil %: 6.1 %
HCT: 31.3 % — ABNORMAL LOW (ref 40.0–52.0)
HGB: 10.7 g/dL — ABNORMAL LOW (ref 13.0–18.0)
Lymphocyte #: 1 x10 3/mm (ref 1.0–3.6)
Lymphocyte %: 16.5 %
MCH: 29.8 pg (ref 26.0–34.0)
Monocyte #: 0.6 x10 3/mm (ref 0.2–1.0)
Neutrophil #: 3.8 x10 3/mm (ref 1.4–6.5)
Neutrophil %: 65.6 %
RBC: 3.58 10*6/uL — ABNORMAL LOW (ref 4.40–5.90)

## 2012-06-10 ENCOUNTER — Ambulatory Visit: Payer: Self-pay | Admitting: Internal Medicine

## 2012-07-07 DIAGNOSIS — H3530 Unspecified macular degeneration: Secondary | ICD-10-CM | POA: Diagnosis not present

## 2012-07-14 DIAGNOSIS — B351 Tinea unguium: Secondary | ICD-10-CM | POA: Diagnosis not present

## 2012-07-14 DIAGNOSIS — E119 Type 2 diabetes mellitus without complications: Secondary | ICD-10-CM | POA: Diagnosis not present

## 2012-08-09 ENCOUNTER — Emergency Department: Payer: Self-pay | Admitting: Unknown Physician Specialty

## 2012-08-09 DIAGNOSIS — E119 Type 2 diabetes mellitus without complications: Secondary | ICD-10-CM | POA: Diagnosis not present

## 2012-08-09 LAB — COMPREHENSIVE METABOLIC PANEL
Albumin: 3.6 g/dL (ref 3.4–5.0)
Alkaline Phosphatase: 104 U/L (ref 50–136)
BUN: 31 mg/dL — ABNORMAL HIGH (ref 7–18)
Bilirubin,Total: 0.6 mg/dL (ref 0.2–1.0)
Co2: 23 mmol/L (ref 21–32)
Creatinine: 1.41 mg/dL — ABNORMAL HIGH (ref 0.60–1.30)
EGFR (African American): 53 — ABNORMAL LOW
Glucose: 120 mg/dL — ABNORMAL HIGH (ref 65–99)
SGOT(AST): 24 U/L (ref 15–37)
SGPT (ALT): 23 U/L (ref 12–78)
Total Protein: 7.3 g/dL (ref 6.4–8.2)

## 2012-08-09 LAB — URINALYSIS, COMPLETE
Bacteria: NONE SEEN
Bilirubin,UR: NEGATIVE
Glucose,UR: NEGATIVE mg/dL (ref 0–75)
Ketone: NEGATIVE
Leukocyte Esterase: NEGATIVE
Nitrite: NEGATIVE
Ph: 6 (ref 4.5–8.0)
Protein: 30
RBC,UR: <1 /HPF (ref 0–5)
Specific Gravity: 1.005 (ref 1.003–1.030)
Squamous Epithelial: NONE SEEN
WBC UR: NONE SEEN /HPF (ref 0–5)

## 2012-08-09 LAB — PROTIME-INR
INR: 1
Prothrombin Time: 12.9 secs (ref 11.5–14.7)

## 2012-08-09 LAB — CBC
HGB: 10.9 g/dL — ABNORMAL LOW (ref 13.0–18.0)
MCH: 29.4 pg (ref 26.0–34.0)
MCHC: 33.3 g/dL (ref 32.0–36.0)
MCV: 88 fL (ref 80–100)
Platelet: 233 10*3/uL (ref 150–440)
RDW: 13.9 % (ref 11.5–14.5)

## 2012-08-09 LAB — TROPONIN I: Troponin-I: <0.02 ng/mL

## 2012-08-09 LAB — CK TOTAL AND CKMB (NOT AT ARMC)
CK, Total: 106 U/L (ref 35–232)
CK-MB: 0.9 ng/mL (ref 0.5–3.6)

## 2012-08-09 LAB — TSH: Thyroid Stimulating Horm: 3.08 u[IU]/mL

## 2012-08-17 ENCOUNTER — Ambulatory Visit: Payer: Self-pay | Admitting: Internal Medicine

## 2012-08-20 LAB — CANCER CENTER HEMOGLOBIN: HGB: 10.6 g/dL — ABNORMAL LOW (ref 13.0–18.0)

## 2012-08-26 DIAGNOSIS — C61 Malignant neoplasm of prostate: Secondary | ICD-10-CM | POA: Diagnosis not present

## 2012-09-02 ENCOUNTER — Other Ambulatory Visit: Payer: Self-pay

## 2012-09-02 MED ORDER — AMLODIPINE BESYLATE 5 MG PO TABS: 5.0000 mg | ORAL_TABLET | Freq: Every day | ORAL | Status: DC

## 2012-09-04 DIAGNOSIS — R42 Dizziness and giddiness: Secondary | ICD-10-CM | POA: Diagnosis not present

## 2012-09-04 DIAGNOSIS — I359 Nonrheumatic aortic valve disorder, unspecified: Secondary | ICD-10-CM | POA: Diagnosis not present

## 2012-09-04 DIAGNOSIS — I4891 Unspecified atrial fibrillation: Secondary | ICD-10-CM | POA: Diagnosis not present

## 2012-09-07 DIAGNOSIS — S93439A Sprain of tibiofibular ligament of unspecified ankle, initial encounter: Secondary | ICD-10-CM | POA: Diagnosis not present

## 2012-09-07 DIAGNOSIS — IMO0002 Reserved for concepts with insufficient information to code with codable children: Secondary | ICD-10-CM | POA: Diagnosis not present

## 2012-09-08 ENCOUNTER — Ambulatory Visit: Payer: Self-pay | Admitting: Internal Medicine

## 2012-09-14 DIAGNOSIS — R42 Dizziness and giddiness: Secondary | ICD-10-CM | POA: Diagnosis not present

## 2012-09-14 DIAGNOSIS — H8110 Benign paroxysmal vertigo, unspecified ear: Secondary | ICD-10-CM | POA: Diagnosis not present

## 2012-09-18 DIAGNOSIS — H8110 Benign paroxysmal vertigo, unspecified ear: Secondary | ICD-10-CM | POA: Diagnosis not present

## 2012-09-22 DIAGNOSIS — L578 Other skin changes due to chronic exposure to nonionizing radiation: Secondary | ICD-10-CM | POA: Diagnosis not present

## 2012-09-22 DIAGNOSIS — L821 Other seborrheic keratosis: Secondary | ICD-10-CM | POA: Diagnosis not present

## 2012-09-22 DIAGNOSIS — Z85828 Personal history of other malignant neoplasm of skin: Secondary | ICD-10-CM | POA: Diagnosis not present

## 2012-09-22 DIAGNOSIS — L570 Actinic keratosis: Secondary | ICD-10-CM | POA: Diagnosis not present

## 2012-10-01 ENCOUNTER — Ambulatory Visit (INDEPENDENT_AMBULATORY_CARE_PROVIDER_SITE_OTHER): Payer: Medicare Other | Admitting: Cardiology

## 2012-10-01 ENCOUNTER — Encounter: Payer: Self-pay | Admitting: Cardiology

## 2012-10-01 VITALS — BP 154/76 | HR 66 | Ht 71.0 in | Wt 247.4 lb

## 2012-10-01 DIAGNOSIS — I2510 Atherosclerotic heart disease of native coronary artery without angina pectoris: Secondary | ICD-10-CM

## 2012-10-01 DIAGNOSIS — E785 Hyperlipidemia, unspecified: Secondary | ICD-10-CM

## 2012-10-01 DIAGNOSIS — I441 Atrioventricular block, second degree: Secondary | ICD-10-CM | POA: Insufficient documentation

## 2012-10-01 DIAGNOSIS — Z954 Presence of other heart-valve replacement: Secondary | ICD-10-CM

## 2012-10-01 DIAGNOSIS — I10 Essential (primary) hypertension: Secondary | ICD-10-CM

## 2012-10-01 DIAGNOSIS — Z952 Presence of prosthetic heart valve: Secondary | ICD-10-CM

## 2012-10-01 HISTORY — DX: Atrioventricular block, second degree: I44.1

## 2012-10-01 NOTE — Patient Instructions (Signed)
Continue your current therapy  I will see you again in 6 months.   
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
## 2012-10-21 DIAGNOSIS — L851 Acquired keratosis [keratoderma] palmaris et plantaris: Secondary | ICD-10-CM | POA: Diagnosis not present

## 2012-10-21 DIAGNOSIS — B351 Tinea unguium: Secondary | ICD-10-CM | POA: Diagnosis not present

## 2012-10-21 DIAGNOSIS — E119 Type 2 diabetes mellitus without complications: Secondary | ICD-10-CM | POA: Diagnosis not present

## 2012-11-10 ENCOUNTER — Ambulatory Visit: Payer: Self-pay | Admitting: Internal Medicine

## 2012-11-10 DIAGNOSIS — D631 Anemia in chronic kidney disease: Secondary | ICD-10-CM | POA: Diagnosis not present

## 2012-11-10 DIAGNOSIS — Z8719 Personal history of other diseases of the digestive system: Secondary | ICD-10-CM | POA: Diagnosis not present

## 2012-11-10 DIAGNOSIS — D509 Iron deficiency anemia, unspecified: Secondary | ICD-10-CM | POA: Diagnosis not present

## 2012-11-10 DIAGNOSIS — N189 Chronic kidney disease, unspecified: Secondary | ICD-10-CM | POA: Diagnosis not present

## 2012-11-16 LAB — IRON AND TIBC: Iron: 66 ug/dL (ref 65–175)

## 2012-11-16 LAB — CANCER CENTER HEMOGLOBIN: HGB: 11.3 g/dL — ABNORMAL LOW (ref 13.0–18.0)

## 2012-11-19 DIAGNOSIS — N2889 Other specified disorders of kidney and ureter: Secondary | ICD-10-CM | POA: Diagnosis not present

## 2012-11-19 DIAGNOSIS — N23 Unspecified renal colic: Secondary | ICD-10-CM | POA: Diagnosis not present

## 2012-11-19 DIAGNOSIS — C61 Malignant neoplasm of prostate: Secondary | ICD-10-CM | POA: Diagnosis not present

## 2012-11-19 DIAGNOSIS — M549 Dorsalgia, unspecified: Secondary | ICD-10-CM | POA: Diagnosis not present

## 2012-11-19 DIAGNOSIS — N281 Cyst of kidney, acquired: Secondary | ICD-10-CM | POA: Diagnosis not present

## 2012-11-19 DIAGNOSIS — K828 Other specified diseases of gallbladder: Secondary | ICD-10-CM | POA: Diagnosis not present

## 2012-11-19 DIAGNOSIS — R911 Solitary pulmonary nodule: Secondary | ICD-10-CM | POA: Diagnosis not present

## 2012-11-25 ENCOUNTER — Telehealth: Payer: Self-pay | Admitting: Cardiology

## 2012-11-25 DIAGNOSIS — C61 Malignant neoplasm of prostate: Secondary | ICD-10-CM | POA: Diagnosis not present

## 2012-11-25 DIAGNOSIS — D4120 Neoplasm of uncertain behavior of unspecified ureter: Secondary | ICD-10-CM | POA: Diagnosis not present

## 2012-11-25 DIAGNOSIS — N281 Cyst of kidney, acquired: Secondary | ICD-10-CM | POA: Diagnosis not present

## 2012-11-25 NOTE — Telephone Encounter (Signed)
New problem   Tammy stated pt is needing cardiac clearance for kidney sx. Please fax over clearance notes to [phone]

## 2012-11-25 NOTE — Telephone Encounter (Signed)
Spoke with Dr.Jordan  he advised patient may have surgery.Note faxed to Tammy at Dr.Cope's office.Fax # [phone].

## 2012-12-02 ENCOUNTER — Ambulatory Visit: Payer: Self-pay | Admitting: Urology

## 2012-12-08 ENCOUNTER — Ambulatory Visit: Payer: Self-pay | Admitting: Internal Medicine

## 2012-12-22 ENCOUNTER — Ambulatory Visit: Payer: Self-pay | Admitting: Urology

## 2012-12-22 DIAGNOSIS — D414 Neoplasm of uncertain behavior of bladder: Secondary | ICD-10-CM | POA: Diagnosis not present

## 2012-12-22 DIAGNOSIS — N134 Hydroureter: Secondary | ICD-10-CM | POA: Diagnosis not present

## 2012-12-22 DIAGNOSIS — Z794 Long term (current) use of insulin: Secondary | ICD-10-CM | POA: Diagnosis not present

## 2012-12-22 DIAGNOSIS — Z880 Allergy status to penicillin: Secondary | ICD-10-CM | POA: Diagnosis not present

## 2012-12-22 DIAGNOSIS — Z79899 Other long term (current) drug therapy: Secondary | ICD-10-CM | POA: Diagnosis not present

## 2012-12-22 DIAGNOSIS — N329 Bladder disorder, unspecified: Secondary | ICD-10-CM | POA: Diagnosis not present

## 2012-12-22 DIAGNOSIS — I499 Cardiac arrhythmia, unspecified: Secondary | ICD-10-CM | POA: Diagnosis not present

## 2012-12-22 DIAGNOSIS — M129 Arthropathy, unspecified: Secondary | ICD-10-CM | POA: Diagnosis not present

## 2012-12-22 DIAGNOSIS — Z888 Allergy status to other drugs, medicaments and biological substances status: Secondary | ICD-10-CM | POA: Diagnosis not present

## 2012-12-22 DIAGNOSIS — E119 Type 2 diabetes mellitus without complications: Secondary | ICD-10-CM | POA: Diagnosis not present

## 2012-12-22 DIAGNOSIS — C61 Malignant neoplasm of prostate: Secondary | ICD-10-CM | POA: Diagnosis not present

## 2012-12-22 DIAGNOSIS — I10 Essential (primary) hypertension: Secondary | ICD-10-CM | POA: Diagnosis not present

## 2012-12-22 DIAGNOSIS — C674 Malignant neoplasm of posterior wall of bladder: Secondary | ICD-10-CM | POA: Diagnosis not present

## 2012-12-22 DIAGNOSIS — D090 Carcinoma in situ of bladder: Secondary | ICD-10-CM | POA: Diagnosis not present

## 2012-12-22 DIAGNOSIS — N135 Crossing vessel and stricture of ureter without hydronephrosis: Secondary | ICD-10-CM | POA: Diagnosis not present

## 2012-12-22 DIAGNOSIS — N1330 Unspecified hydronephrosis: Secondary | ICD-10-CM | POA: Diagnosis not present

## 2012-12-22 DIAGNOSIS — E669 Obesity, unspecified: Secondary | ICD-10-CM | POA: Diagnosis not present

## 2012-12-22 DIAGNOSIS — N320 Bladder-neck obstruction: Secondary | ICD-10-CM | POA: Diagnosis not present

## 2012-12-31 DIAGNOSIS — N135 Crossing vessel and stricture of ureter without hydronephrosis: Secondary | ICD-10-CM | POA: Diagnosis not present

## 2012-12-31 DIAGNOSIS — C679 Malignant neoplasm of bladder, unspecified: Secondary | ICD-10-CM | POA: Diagnosis not present

## 2012-12-31 DIAGNOSIS — C61 Malignant neoplasm of prostate: Secondary | ICD-10-CM | POA: Diagnosis not present

## 2012-12-31 DIAGNOSIS — N3941 Urge incontinence: Secondary | ICD-10-CM | POA: Diagnosis not present

## 2013-01-12 DIAGNOSIS — B351 Tinea unguium: Secondary | ICD-10-CM | POA: Diagnosis not present

## 2013-01-12 DIAGNOSIS — E119 Type 2 diabetes mellitus without complications: Secondary | ICD-10-CM | POA: Diagnosis not present

## 2013-01-12 DIAGNOSIS — L851 Acquired keratosis [keratoderma] palmaris et plantaris: Secondary | ICD-10-CM | POA: Diagnosis not present

## 2013-01-14 DIAGNOSIS — T191XXA Foreign body in bladder, initial encounter: Secondary | ICD-10-CM | POA: Diagnosis not present

## 2013-01-14 DIAGNOSIS — C679 Malignant neoplasm of bladder, unspecified: Secondary | ICD-10-CM | POA: Diagnosis not present

## 2013-01-14 DIAGNOSIS — T190XXA Foreign body in urethra, initial encounter: Secondary | ICD-10-CM | POA: Diagnosis not present

## 2013-01-20 DIAGNOSIS — C679 Malignant neoplasm of bladder, unspecified: Secondary | ICD-10-CM | POA: Diagnosis not present

## 2013-01-27 DIAGNOSIS — C679 Malignant neoplasm of bladder, unspecified: Secondary | ICD-10-CM | POA: Diagnosis not present

## 2013-01-28 DIAGNOSIS — E119 Type 2 diabetes mellitus without complications: Secondary | ICD-10-CM | POA: Diagnosis not present

## 2013-02-03 DIAGNOSIS — C679 Malignant neoplasm of bladder, unspecified: Secondary | ICD-10-CM | POA: Diagnosis not present

## 2013-02-04 DIAGNOSIS — I10 Essential (primary) hypertension: Secondary | ICD-10-CM | POA: Diagnosis not present

## 2013-02-04 DIAGNOSIS — E1029 Type 1 diabetes mellitus with other diabetic kidney complication: Secondary | ICD-10-CM | POA: Diagnosis not present

## 2013-02-16 DIAGNOSIS — M064 Inflammatory polyarthropathy: Secondary | ICD-10-CM | POA: Diagnosis not present

## 2013-02-16 DIAGNOSIS — M6530 Trigger finger, unspecified finger: Secondary | ICD-10-CM | POA: Diagnosis not present

## 2013-02-16 DIAGNOSIS — G609 Hereditary and idiopathic neuropathy, unspecified: Secondary | ICD-10-CM | POA: Diagnosis not present

## 2013-02-17 DIAGNOSIS — C679 Malignant neoplasm of bladder, unspecified: Secondary | ICD-10-CM | POA: Diagnosis not present

## 2013-02-24 DIAGNOSIS — C679 Malignant neoplasm of bladder, unspecified: Secondary | ICD-10-CM | POA: Diagnosis not present

## 2013-02-25 ENCOUNTER — Encounter: Payer: Self-pay | Admitting: Rheumatology

## 2013-02-25 DIAGNOSIS — IMO0001 Reserved for inherently not codable concepts without codable children: Secondary | ICD-10-CM | POA: Diagnosis not present

## 2013-02-25 DIAGNOSIS — M2550 Pain in unspecified joint: Secondary | ICD-10-CM | POA: Diagnosis not present

## 2013-02-25 DIAGNOSIS — M2560 Stiffness of unspecified joint, not elsewhere classified: Secondary | ICD-10-CM | POA: Diagnosis not present

## 2013-02-25 DIAGNOSIS — M6281 Muscle weakness (generalized): Secondary | ICD-10-CM | POA: Diagnosis not present

## 2013-02-25 DIAGNOSIS — R609 Edema, unspecified: Secondary | ICD-10-CM | POA: Diagnosis not present

## 2013-03-03 DIAGNOSIS — B9689 Other specified bacterial agents as the cause of diseases classified elsewhere: Secondary | ICD-10-CM | POA: Diagnosis not present

## 2013-03-03 DIAGNOSIS — E119 Type 2 diabetes mellitus without complications: Secondary | ICD-10-CM | POA: Diagnosis not present

## 2013-03-03 DIAGNOSIS — Z8052 Family history of malignant neoplasm of bladder: Secondary | ICD-10-CM | POA: Diagnosis not present

## 2013-03-03 DIAGNOSIS — R0602 Shortness of breath: Secondary | ICD-10-CM | POA: Diagnosis not present

## 2013-03-03 DIAGNOSIS — Z888 Allergy status to other drugs, medicaments and biological substances status: Secondary | ICD-10-CM | POA: Diagnosis not present

## 2013-03-03 DIAGNOSIS — Z954 Presence of other heart-valve replacement: Secondary | ICD-10-CM | POA: Diagnosis not present

## 2013-03-03 DIAGNOSIS — I4891 Unspecified atrial fibrillation: Secondary | ICD-10-CM | POA: Diagnosis not present

## 2013-03-03 DIAGNOSIS — R6883 Chills (without fever): Secondary | ICD-10-CM | POA: Diagnosis not present

## 2013-03-03 DIAGNOSIS — I10 Essential (primary) hypertension: Secondary | ICD-10-CM | POA: Diagnosis not present

## 2013-03-03 DIAGNOSIS — C61 Malignant neoplasm of prostate: Secondary | ICD-10-CM | POA: Diagnosis not present

## 2013-03-03 DIAGNOSIS — R1110 Vomiting, unspecified: Secondary | ICD-10-CM | POA: Diagnosis not present

## 2013-03-03 DIAGNOSIS — Z951 Presence of aortocoronary bypass graft: Secondary | ICD-10-CM | POA: Diagnosis not present

## 2013-03-03 DIAGNOSIS — I2510 Atherosclerotic heart disease of native coronary artery without angina pectoris: Secondary | ICD-10-CM | POA: Diagnosis not present

## 2013-03-03 DIAGNOSIS — R197 Diarrhea, unspecified: Secondary | ICD-10-CM | POA: Diagnosis not present

## 2013-03-03 DIAGNOSIS — Z880 Allergy status to penicillin: Secondary | ICD-10-CM | POA: Diagnosis not present

## 2013-03-03 DIAGNOSIS — M25569 Pain in unspecified knee: Secondary | ICD-10-CM | POA: Diagnosis not present

## 2013-03-03 DIAGNOSIS — N179 Acute kidney failure, unspecified: Secondary | ICD-10-CM | POA: Diagnosis not present

## 2013-03-03 DIAGNOSIS — Z794 Long term (current) use of insulin: Secondary | ICD-10-CM | POA: Diagnosis not present

## 2013-03-03 DIAGNOSIS — J811 Chronic pulmonary edema: Secondary | ICD-10-CM | POA: Diagnosis not present

## 2013-03-03 DIAGNOSIS — Z8042 Family history of malignant neoplasm of prostate: Secondary | ICD-10-CM | POA: Diagnosis not present

## 2013-03-03 DIAGNOSIS — C679 Malignant neoplasm of bladder, unspecified: Secondary | ICD-10-CM | POA: Diagnosis not present

## 2013-03-03 LAB — CK TOTAL AND CKMB (NOT AT ARMC)
CK, Total: 78 U/L (ref 35–232)
CK-MB: 1.1 ng/mL (ref 0.5–3.6)

## 2013-03-03 LAB — CBC
HCT: 32.6 % — ABNORMAL LOW (ref 40.0–52.0)
HGB: 11.1 g/dL — ABNORMAL LOW (ref 13.0–18.0)
MCH: 29.5 pg (ref 26.0–34.0)
MCV: 87 fL (ref 80–100)
Platelet: 249 10*3/uL (ref 150–440)
WBC: 11.1 10*3/uL — ABNORMAL HIGH (ref 3.8–10.6)

## 2013-03-03 LAB — BASIC METABOLIC PANEL
Anion Gap: 8 (ref 7–16)
Chloride: 102 mmol/L (ref 98–107)
Co2: 24 mmol/L (ref 21–32)
Creatinine: 1.68 mg/dL — ABNORMAL HIGH (ref 0.60–1.30)
EGFR (African American): 43 — ABNORMAL LOW
EGFR (Non-African Amer.): 37 — ABNORMAL LOW
Osmolality: 279 (ref 275–301)
Potassium: 4 mmol/L (ref 3.5–5.1)
Sodium: 134 mmol/L — ABNORMAL LOW (ref 136–145)

## 2013-03-03 LAB — URINALYSIS, COMPLETE
Ph: 5 (ref 4.5–8.0)
Protein: 100
RBC,UR: 211 /HPF (ref 0–5)
Specific Gravity: 1.012 (ref 1.003–1.030)
Squamous Epithelial: NONE SEEN
WBC UR: 199 /HPF (ref 0–5)

## 2013-03-03 LAB — TROPONIN I: Troponin-I: <0.02 ng/mL

## 2013-03-04 ENCOUNTER — Observation Stay: Payer: Self-pay | Admitting: Internal Medicine

## 2013-03-04 ENCOUNTER — Ambulatory Visit: Payer: Medicare Other | Admitting: Cardiology

## 2013-03-04 DIAGNOSIS — C679 Malignant neoplasm of bladder, unspecified: Secondary | ICD-10-CM | POA: Diagnosis not present

## 2013-03-04 DIAGNOSIS — R5082 Postprocedural fever: Secondary | ICD-10-CM | POA: Diagnosis not present

## 2013-03-04 DIAGNOSIS — I4891 Unspecified atrial fibrillation: Secondary | ICD-10-CM | POA: Diagnosis not present

## 2013-03-04 DIAGNOSIS — R0602 Shortness of breath: Secondary | ICD-10-CM | POA: Diagnosis not present

## 2013-03-04 DIAGNOSIS — M2550 Pain in unspecified joint: Secondary | ICD-10-CM | POA: Diagnosis not present

## 2013-03-05 LAB — URINE CULTURE

## 2013-03-08 DIAGNOSIS — M47817 Spondylosis without myelopathy or radiculopathy, lumbosacral region: Secondary | ICD-10-CM | POA: Diagnosis not present

## 2013-03-08 DIAGNOSIS — M545 Low back pain: Secondary | ICD-10-CM | POA: Diagnosis not present

## 2013-03-08 DIAGNOSIS — M6530 Trigger finger, unspecified finger: Secondary | ICD-10-CM | POA: Diagnosis not present

## 2013-03-08 DIAGNOSIS — M19049 Primary osteoarthritis, unspecified hand: Secondary | ICD-10-CM | POA: Diagnosis not present

## 2013-03-08 LAB — CULTURE, BLOOD (SINGLE)

## 2013-03-10 ENCOUNTER — Encounter: Payer: Self-pay | Admitting: Rheumatology

## 2013-03-10 DIAGNOSIS — IMO0001 Reserved for inherently not codable concepts without codable children: Secondary | ICD-10-CM | POA: Diagnosis not present

## 2013-03-10 DIAGNOSIS — M2550 Pain in unspecified joint: Secondary | ICD-10-CM | POA: Diagnosis not present

## 2013-03-10 DIAGNOSIS — M2560 Stiffness of unspecified joint, not elsewhere classified: Secondary | ICD-10-CM | POA: Diagnosis not present

## 2013-03-10 DIAGNOSIS — M6281 Muscle weakness (generalized): Secondary | ICD-10-CM | POA: Diagnosis not present

## 2013-03-10 DIAGNOSIS — H3530 Unspecified macular degeneration: Secondary | ICD-10-CM | POA: Diagnosis not present

## 2013-03-10 DIAGNOSIS — R609 Edema, unspecified: Secondary | ICD-10-CM | POA: Diagnosis not present

## 2013-03-11 DIAGNOSIS — M6281 Muscle weakness (generalized): Secondary | ICD-10-CM | POA: Diagnosis not present

## 2013-03-11 DIAGNOSIS — R609 Edema, unspecified: Secondary | ICD-10-CM | POA: Diagnosis not present

## 2013-03-11 DIAGNOSIS — M2560 Stiffness of unspecified joint, not elsewhere classified: Secondary | ICD-10-CM | POA: Diagnosis not present

## 2013-03-11 DIAGNOSIS — IMO0001 Reserved for inherently not codable concepts without codable children: Secondary | ICD-10-CM | POA: Diagnosis not present

## 2013-03-11 DIAGNOSIS — M2550 Pain in unspecified joint: Secondary | ICD-10-CM | POA: Diagnosis not present

## 2013-03-16 DIAGNOSIS — M2560 Stiffness of unspecified joint, not elsewhere classified: Secondary | ICD-10-CM | POA: Diagnosis not present

## 2013-03-16 DIAGNOSIS — R609 Edema, unspecified: Secondary | ICD-10-CM | POA: Diagnosis not present

## 2013-03-16 DIAGNOSIS — M6281 Muscle weakness (generalized): Secondary | ICD-10-CM | POA: Diagnosis not present

## 2013-03-16 DIAGNOSIS — M2550 Pain in unspecified joint: Secondary | ICD-10-CM | POA: Diagnosis not present

## 2013-03-16 DIAGNOSIS — IMO0001 Reserved for inherently not codable concepts without codable children: Secondary | ICD-10-CM | POA: Diagnosis not present

## 2013-03-22 DIAGNOSIS — H353190 Nonexudative age-related macular degeneration, unspecified eye, stage unspecified: Secondary | ICD-10-CM | POA: Diagnosis not present

## 2013-03-29 ENCOUNTER — Encounter: Payer: Self-pay | Admitting: Rheumatology

## 2013-03-29 DIAGNOSIS — R262 Difficulty in walking, not elsewhere classified: Secondary | ICD-10-CM | POA: Diagnosis not present

## 2013-03-29 DIAGNOSIS — IMO0001 Reserved for inherently not codable concepts without codable children: Secondary | ICD-10-CM | POA: Diagnosis not present

## 2013-03-29 DIAGNOSIS — M6281 Muscle weakness (generalized): Secondary | ICD-10-CM | POA: Diagnosis not present

## 2013-03-29 DIAGNOSIS — M545 Low back pain, unspecified: Secondary | ICD-10-CM | POA: Diagnosis not present

## 2013-03-30 DIAGNOSIS — Z23 Encounter for immunization: Secondary | ICD-10-CM | POA: Diagnosis not present

## 2013-04-05 DIAGNOSIS — R21 Rash and other nonspecific skin eruption: Secondary | ICD-10-CM | POA: Diagnosis not present

## 2013-04-05 DIAGNOSIS — L259 Unspecified contact dermatitis, unspecified cause: Secondary | ICD-10-CM | POA: Diagnosis not present

## 2013-04-09 DIAGNOSIS — L259 Unspecified contact dermatitis, unspecified cause: Secondary | ICD-10-CM | POA: Diagnosis not present

## 2013-04-09 DIAGNOSIS — L129 Pemphigoid, unspecified: Secondary | ICD-10-CM | POA: Diagnosis not present

## 2013-04-09 DIAGNOSIS — R21 Rash and other nonspecific skin eruption: Secondary | ICD-10-CM | POA: Diagnosis not present

## 2013-04-10 ENCOUNTER — Encounter: Payer: Self-pay | Admitting: Rheumatology

## 2013-04-10 DIAGNOSIS — M6281 Muscle weakness (generalized): Secondary | ICD-10-CM | POA: Diagnosis not present

## 2013-04-10 DIAGNOSIS — IMO0001 Reserved for inherently not codable concepts without codable children: Secondary | ICD-10-CM | POA: Diagnosis not present

## 2013-04-10 DIAGNOSIS — M545 Low back pain, unspecified: Secondary | ICD-10-CM | POA: Diagnosis not present

## 2013-04-10 DIAGNOSIS — R262 Difficulty in walking, not elsewhere classified: Secondary | ICD-10-CM | POA: Diagnosis not present

## 2013-04-14 DIAGNOSIS — L129 Pemphigoid, unspecified: Secondary | ICD-10-CM | POA: Diagnosis not present

## 2013-04-28 DIAGNOSIS — L129 Pemphigoid, unspecified: Secondary | ICD-10-CM | POA: Diagnosis not present

## 2013-04-28 DIAGNOSIS — L570 Actinic keratosis: Secondary | ICD-10-CM | POA: Diagnosis not present

## 2013-05-03 ENCOUNTER — Telehealth: Payer: Self-pay | Admitting: Cardiology

## 2013-05-03 ENCOUNTER — Ambulatory Visit: Payer: Medicare Other | Admitting: Cardiology

## 2013-05-05 DIAGNOSIS — E1029 Type 1 diabetes mellitus with other diabetic kidney complication: Secondary | ICD-10-CM | POA: Diagnosis not present

## 2013-05-11 ENCOUNTER — Ambulatory Visit: Payer: Medicare Other | Admitting: Cardiovascular Disease

## 2013-05-12 ENCOUNTER — Encounter: Payer: Self-pay | Admitting: Cardiovascular Disease

## 2013-05-12 ENCOUNTER — Ambulatory Visit: Payer: Self-pay | Admitting: Cardiovascular Disease

## 2013-05-12 ENCOUNTER — Ambulatory Visit (INDEPENDENT_AMBULATORY_CARE_PROVIDER_SITE_OTHER): Payer: Medicare Other | Admitting: Cardiovascular Disease

## 2013-05-12 VITALS — BP 122/60 | HR 83 | Ht 70.5 in | Wt 248.5 lb

## 2013-05-12 DIAGNOSIS — I498 Other specified cardiac arrhythmias: Secondary | ICD-10-CM | POA: Diagnosis not present

## 2013-05-12 DIAGNOSIS — I350 Nonrheumatic aortic (valve) stenosis: Secondary | ICD-10-CM

## 2013-05-12 DIAGNOSIS — Z954 Presence of other heart-valve replacement: Secondary | ICD-10-CM

## 2013-05-12 DIAGNOSIS — M7989 Other specified soft tissue disorders: Secondary | ICD-10-CM | POA: Diagnosis not present

## 2013-05-12 DIAGNOSIS — I2510 Atherosclerotic heart disease of native coronary artery without angina pectoris: Secondary | ICD-10-CM | POA: Diagnosis not present

## 2013-05-12 DIAGNOSIS — N19 Unspecified kidney failure: Secondary | ICD-10-CM

## 2013-05-12 DIAGNOSIS — I4891 Unspecified atrial fibrillation: Secondary | ICD-10-CM

## 2013-05-12 DIAGNOSIS — D649 Anemia, unspecified: Secondary | ICD-10-CM

## 2013-05-12 DIAGNOSIS — I359 Nonrheumatic aortic valve disorder, unspecified: Secondary | ICD-10-CM

## 2013-05-12 DIAGNOSIS — R001 Bradycardia, unspecified: Secondary | ICD-10-CM

## 2013-05-12 DIAGNOSIS — K922 Gastrointestinal hemorrhage, unspecified: Secondary | ICD-10-CM

## 2013-05-12 DIAGNOSIS — I10 Essential (primary) hypertension: Secondary | ICD-10-CM

## 2013-05-12 DIAGNOSIS — I482 Chronic atrial fibrillation, unspecified: Secondary | ICD-10-CM | POA: Insufficient documentation

## 2013-05-12 DIAGNOSIS — Z952 Presence of prosthetic heart valve: Secondary | ICD-10-CM

## 2013-05-12 DIAGNOSIS — R609 Edema, unspecified: Secondary | ICD-10-CM | POA: Diagnosis not present

## 2013-05-12 DIAGNOSIS — M79609 Pain in unspecified limb: Secondary | ICD-10-CM | POA: Diagnosis not present

## 2013-05-12 NOTE — Patient Instructions (Addendum)
Please stop the amlodipine  We will order a left lower extremity doppler (venous) to rule out a DVT in the leg In the hosptital today  If you have a blood clot, we will call you. We would then start you on eliquis.(blood thinner)  We will schedule an echocardiogram for possible atrial fibrillation, Friday at 4:30  We will check you blood today  Please call us if you have new issues that need to be addressed before your next appt.  Your physician wants you to follow-up in: 1 week

## 2013-05-12 NOTE — Assessment & Plan Note (Signed)
We'll need to proceed with anticoagulation cautiously. We'll confirm atrial fibrillation before starting eliquis.
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
## 2013-05-12 NOTE — Assessment & Plan Note (Signed)
Suggested he hold his amlodipine given his lower extremity edema. Also complication from the calcium channel blocker.

## 2013-05-12 NOTE — Assessment & Plan Note (Signed)
He appears to have converted to atrial fibrillation in early 2014. Echocardiogram ordered. Given history of possible GI bleed, anemia, could consider low-dose eliquis 2.5 mg twice a day. We'll discuss with EP.

## 2013-05-12 NOTE — Assessment & Plan Note (Signed)
Echocardiogram pending. We'll evaluate ejection fraction. He denies active chest pain concerning for ischemia.

## 2013-05-12 NOTE — Assessment & Plan Note (Addendum)
Repeat echocardiogram ordered for worsening lower extremity edema, suspected atrial fibrillation. We'll evaluate his prosthetic valve.

## 2013-05-13 ENCOUNTER — Telehealth: Payer: Self-pay

## 2013-05-13 ENCOUNTER — Other Ambulatory Visit: Payer: Self-pay

## 2013-05-13 ENCOUNTER — Telehealth: Payer: Self-pay | Admitting: Cardiovascular Disease

## 2013-05-13 ENCOUNTER — Encounter: Payer: Self-pay | Admitting: Rheumatology

## 2013-05-13 DIAGNOSIS — M7989 Other specified soft tissue disorders: Secondary | ICD-10-CM

## 2013-05-13 LAB — BASIC METABOLIC PANEL
GFR calc Af Amer: 34 mL/min/{1.73_m2} — ABNORMAL LOW (ref >59)
GFR calc non Af Amer: 30 mL/min/{1.73_m2} — ABNORMAL LOW (ref >59)
Potassium: 4.7 mmol/L (ref 3.5–5.2)
Sodium: 140 mmol/L (ref 134–144)

## 2013-05-13 LAB — CBC
MCH: 27.8 pg (ref 26.6–33.0)
Platelets: 265 10*3/uL (ref 150–379)

## 2013-05-13 MED ORDER — APIXABAN 2.5 MG PO TABS: 2.5000 mg | ORAL_TABLET | Freq: Two times a day (BID) | ORAL | Status: DC

## 2013-05-13 NOTE — Telephone Encounter (Signed)
Spoke w/ pt's wife and instructed her to have pt take Eliquis 2.5mg  BID as pt is in afib and to keep appt for ECHO for tomorrow.  Pt is in background stating "I took a whole one this morning because Aneta Mins told me to." He reports that he feels fine.  Wife reports that she will make sure he keeps appt tomorrow and will call with any other questions or concerns.

## 2013-05-13 NOTE — Addendum Note (Signed)
Addended by: Antonieta Iba on: 05/13/2013 04:58 PM   Modules accepted: Orders

## 2013-05-13 NOTE — Telephone Encounter (Signed)
Bedside LE doppler performed today by vascular techs (missy/tracy) to rule out DVT No DVT noted in the left lower extremity Will continue on eliquis 2.5 mg po BID for atrial fibrillation

## 2013-05-13 NOTE — Telephone Encounter (Signed)
Attempted to contact pt, but no answer, no machine. Will call back to instruct pt to take Eliquis 2.5 mg (cut 5 mg samples given yesterday in 1/2) and keep appt for ECHO tomorrow in our office.

## 2013-05-14 ENCOUNTER — Other Ambulatory Visit (INDEPENDENT_AMBULATORY_CARE_PROVIDER_SITE_OTHER): Payer: Medicare Other

## 2013-05-14 ENCOUNTER — Other Ambulatory Visit: Payer: Self-pay

## 2013-05-14 DIAGNOSIS — Z952 Presence of prosthetic heart valve: Secondary | ICD-10-CM

## 2013-05-14 DIAGNOSIS — I2510 Atherosclerotic heart disease of native coronary artery without angina pectoris: Secondary | ICD-10-CM | POA: Diagnosis not present

## 2013-05-14 DIAGNOSIS — I4891 Unspecified atrial fibrillation: Secondary | ICD-10-CM

## 2013-05-20 DIAGNOSIS — L129 Pemphigoid, unspecified: Secondary | ICD-10-CM | POA: Diagnosis not present

## 2013-05-26 ENCOUNTER — Encounter: Payer: Self-pay | Admitting: *Deleted

## 2013-05-26 DIAGNOSIS — C679 Malignant neoplasm of bladder, unspecified: Secondary | ICD-10-CM | POA: Diagnosis not present

## 2013-05-26 DIAGNOSIS — C61 Malignant neoplasm of prostate: Secondary | ICD-10-CM | POA: Diagnosis not present

## 2013-06-08 DIAGNOSIS — C61 Malignant neoplasm of prostate: Secondary | ICD-10-CM | POA: Diagnosis not present

## 2013-06-11 DIAGNOSIS — M4800 Spinal stenosis, site unspecified: Secondary | ICD-10-CM | POA: Diagnosis not present

## 2013-06-14 DIAGNOSIS — E119 Type 2 diabetes mellitus without complications: Secondary | ICD-10-CM | POA: Diagnosis not present

## 2013-06-14 DIAGNOSIS — L851 Acquired keratosis [keratoderma] palmaris et plantaris: Secondary | ICD-10-CM | POA: Diagnosis not present

## 2013-06-14 DIAGNOSIS — B351 Tinea unguium: Secondary | ICD-10-CM | POA: Diagnosis not present

## 2013-06-17 ENCOUNTER — Ambulatory Visit: Payer: Self-pay | Admitting: Orthopedic Surgery

## 2013-06-17 DIAGNOSIS — M48061 Spinal stenosis, lumbar region without neurogenic claudication: Secondary | ICD-10-CM | POA: Diagnosis not present

## 2013-06-17 DIAGNOSIS — M5124 Other intervertebral disc displacement, thoracic region: Secondary | ICD-10-CM | POA: Diagnosis not present

## 2013-06-17 DIAGNOSIS — M5137 Other intervertebral disc degeneration, lumbosacral region: Secondary | ICD-10-CM | POA: Diagnosis not present

## 2013-06-17 DIAGNOSIS — M5126 Other intervertebral disc displacement, lumbar region: Secondary | ICD-10-CM | POA: Diagnosis not present

## 2013-06-18 DIAGNOSIS — N289 Disorder of kidney and ureter, unspecified: Secondary | ICD-10-CM | POA: Diagnosis not present

## 2013-06-18 DIAGNOSIS — E1129 Type 2 diabetes mellitus with other diabetic kidney complication: Secondary | ICD-10-CM | POA: Diagnosis not present

## 2013-06-18 DIAGNOSIS — E119 Type 2 diabetes mellitus without complications: Secondary | ICD-10-CM | POA: Diagnosis not present

## 2013-06-18 DIAGNOSIS — I10 Essential (primary) hypertension: Secondary | ICD-10-CM | POA: Diagnosis not present

## 2013-06-25 ENCOUNTER — Telehealth: Payer: Self-pay

## 2013-06-25 NOTE — Telephone Encounter (Signed)
Pt called stating the a form was sent over to our office for pt to be cleared for steroid injections in his back that needs Dr. Donivan Scull signature.  Advised pt that Dr. Rockey Situ is out of the office but would send to [facility] office and ask if Dr. Martinique can sign this, as he last saw pt 05/03/13.

## 2013-06-25 NOTE — Telephone Encounter (Signed)
Cleared for spinal injection. Would need to hold Eliquis at least 48 hours prior to injection.  Peter Martinique MD, [facility]

## 2013-06-28 NOTE — Telephone Encounter (Signed)
Faxed clearance to [facility], Dr. Sharlet Salina at [phone].

## 2013-07-02 DIAGNOSIS — L259 Unspecified contact dermatitis, unspecified cause: Secondary | ICD-10-CM | POA: Diagnosis not present

## 2013-07-02 DIAGNOSIS — L723 Sebaceous cyst: Secondary | ICD-10-CM | POA: Diagnosis not present

## 2013-07-02 DIAGNOSIS — L570 Actinic keratosis: Secondary | ICD-10-CM | POA: Diagnosis not present

## 2013-07-02 DIAGNOSIS — L129 Pemphigoid, unspecified: Secondary | ICD-10-CM | POA: Diagnosis not present

## 2013-07-02 DIAGNOSIS — L820 Inflamed seborrheic keratosis: Secondary | ICD-10-CM | POA: Diagnosis not present

## 2013-07-08 ENCOUNTER — Ambulatory Visit: Payer: Medicare Other | Admitting: Cardiology

## 2013-07-15 DIAGNOSIS — M5137 Other intervertebral disc degeneration, lumbosacral region: Secondary | ICD-10-CM | POA: Diagnosis not present

## 2013-07-15 DIAGNOSIS — M47817 Spondylosis without myelopathy or radiculopathy, lumbosacral region: Secondary | ICD-10-CM | POA: Diagnosis not present

## 2013-07-15 DIAGNOSIS — IMO0002 Reserved for concepts with insufficient information to code with codable children: Secondary | ICD-10-CM | POA: Diagnosis not present

## 2013-08-16 DIAGNOSIS — M4800 Spinal stenosis, site unspecified: Secondary | ICD-10-CM | POA: Diagnosis not present

## 2013-08-25 ENCOUNTER — Telehealth: Payer: Self-pay

## 2013-08-25 NOTE — Telephone Encounter (Signed)
Faxed clearance for pt to hold Eliquis 48 hrs prior to epidural steroid procedure to [facility], Dr. Sharlet Salina at [phone].

## 2013-08-26 ENCOUNTER — Telehealth: Payer: Self-pay

## 2013-08-26 DIAGNOSIS — IMO0002 Reserved for concepts with insufficient information to code with codable children: Secondary | ICD-10-CM | POA: Diagnosis not present

## 2013-08-26 DIAGNOSIS — M5137 Other intervertebral disc degeneration, lumbosacral region: Secondary | ICD-10-CM | POA: Diagnosis not present

## 2013-08-26 DIAGNOSIS — M47817 Spondylosis without myelopathy or radiculopathy, lumbosacral region: Secondary | ICD-10-CM | POA: Diagnosis not present

## 2013-08-26 NOTE — Telephone Encounter (Signed)
Pt wife called and states pt has stopped his medication for a couple days(she is not sure what it is) due to a shot pt was going to have. Pt wife asks should patient be eating "green things, spinach, leafy things". Please call.

## 2013-08-26 NOTE — Telephone Encounter (Signed)
Spoke w/ pt's wife.  Advised her that Vit K in green leafy vegetables is the antidote to coumadin, not eliquis, so pt may continue to have his daily salad. Pt to call w/ further questions or concerns.

## 2013-08-27 DIAGNOSIS — M76829 Posterior tibial tendinitis, unspecified leg: Secondary | ICD-10-CM | POA: Diagnosis not present

## 2013-08-27 DIAGNOSIS — M24876 Other specific joint derangements of unspecified foot, not elsewhere classified: Secondary | ICD-10-CM | POA: Diagnosis not present

## 2013-08-27 DIAGNOSIS — M79609 Pain in unspecified limb: Secondary | ICD-10-CM | POA: Diagnosis not present

## 2013-08-27 DIAGNOSIS — M24873 Other specific joint derangements of unspecified ankle, not elsewhere classified: Secondary | ICD-10-CM | POA: Diagnosis not present

## 2013-09-07 DIAGNOSIS — H26499 Other secondary cataract, unspecified eye: Secondary | ICD-10-CM | POA: Diagnosis not present

## 2013-09-07 DIAGNOSIS — E119 Type 2 diabetes mellitus without complications: Secondary | ICD-10-CM | POA: Diagnosis not present

## 2013-09-10 ENCOUNTER — Telehealth: Payer: Self-pay

## 2013-09-10 NOTE — Telephone Encounter (Signed)
Request from [facility] , sent to [place] on 09/09/2013 .

## 2013-09-13 DIAGNOSIS — M4800 Spinal stenosis, site unspecified: Secondary | ICD-10-CM | POA: Diagnosis not present

## 2013-09-13 DIAGNOSIS — E109 Type 1 diabetes mellitus without complications: Secondary | ICD-10-CM | POA: Diagnosis not present

## 2013-09-20 DIAGNOSIS — I10 Essential (primary) hypertension: Secondary | ICD-10-CM | POA: Diagnosis not present

## 2013-09-20 DIAGNOSIS — N289 Disorder of kidney and ureter, unspecified: Secondary | ICD-10-CM | POA: Diagnosis not present

## 2013-09-20 DIAGNOSIS — E1129 Type 2 diabetes mellitus with other diabetic kidney complication: Secondary | ICD-10-CM | POA: Diagnosis not present

## 2013-09-20 DIAGNOSIS — D649 Anemia, unspecified: Secondary | ICD-10-CM | POA: Diagnosis not present

## 2013-09-21 DIAGNOSIS — M545 Low back pain, unspecified: Secondary | ICD-10-CM | POA: Diagnosis not present

## 2013-09-21 DIAGNOSIS — M6281 Muscle weakness (generalized): Secondary | ICD-10-CM | POA: Diagnosis not present

## 2013-09-21 DIAGNOSIS — R262 Difficulty in walking, not elsewhere classified: Secondary | ICD-10-CM | POA: Diagnosis not present

## 2013-09-22 DIAGNOSIS — C61 Malignant neoplasm of prostate: Secondary | ICD-10-CM | POA: Diagnosis not present

## 2013-09-22 DIAGNOSIS — C679 Malignant neoplasm of bladder, unspecified: Secondary | ICD-10-CM | POA: Diagnosis not present

## 2013-09-24 DIAGNOSIS — H8110 Benign paroxysmal vertigo, unspecified ear: Secondary | ICD-10-CM | POA: Diagnosis not present

## 2013-09-28 DIAGNOSIS — H8110 Benign paroxysmal vertigo, unspecified ear: Secondary | ICD-10-CM | POA: Diagnosis not present

## 2013-09-30 DIAGNOSIS — M545 Low back pain, unspecified: Secondary | ICD-10-CM | POA: Diagnosis not present

## 2013-09-30 DIAGNOSIS — M6281 Muscle weakness (generalized): Secondary | ICD-10-CM | POA: Diagnosis not present

## 2013-09-30 DIAGNOSIS — R262 Difficulty in walking, not elsewhere classified: Secondary | ICD-10-CM | POA: Diagnosis not present

## 2013-10-05 DIAGNOSIS — B351 Tinea unguium: Secondary | ICD-10-CM | POA: Diagnosis not present

## 2013-10-05 DIAGNOSIS — E119 Type 2 diabetes mellitus without complications: Secondary | ICD-10-CM | POA: Diagnosis not present

## 2013-10-06 DIAGNOSIS — M545 Low back pain, unspecified: Secondary | ICD-10-CM | POA: Diagnosis not present

## 2013-10-06 DIAGNOSIS — M6281 Muscle weakness (generalized): Secondary | ICD-10-CM | POA: Diagnosis not present

## 2013-10-06 DIAGNOSIS — IMO0002 Reserved for concepts with insufficient information to code with codable children: Secondary | ICD-10-CM | POA: Diagnosis not present

## 2013-10-12 DIAGNOSIS — M545 Low back pain, unspecified: Secondary | ICD-10-CM | POA: Diagnosis not present

## 2013-10-12 DIAGNOSIS — M6281 Muscle weakness (generalized): Secondary | ICD-10-CM | POA: Diagnosis not present

## 2013-10-14 DIAGNOSIS — M545 Low back pain, unspecified: Secondary | ICD-10-CM | POA: Diagnosis not present

## 2013-10-19 DIAGNOSIS — M545 Low back pain, unspecified: Secondary | ICD-10-CM | POA: Diagnosis not present

## 2013-10-21 DIAGNOSIS — M545 Low back pain, unspecified: Secondary | ICD-10-CM | POA: Diagnosis not present

## 2013-10-21 DIAGNOSIS — R262 Difficulty in walking, not elsewhere classified: Secondary | ICD-10-CM | POA: Diagnosis not present

## 2013-10-21 DIAGNOSIS — M6281 Muscle weakness (generalized): Secondary | ICD-10-CM | POA: Diagnosis not present

## 2013-10-26 ENCOUNTER — Telehealth: Payer: Self-pay

## 2013-10-26 NOTE — Telephone Encounter (Signed)
Pt would like Eliquis samples.  

## 2013-10-26 NOTE — Telephone Encounter (Signed)
Placed sample of Eliquis 2.5 mg upfront for pick up.

## 2013-11-05 ENCOUNTER — Telehealth: Payer: Self-pay | Admitting: *Deleted

## 2013-11-05 NOTE — Telephone Encounter (Signed)
LMOM that we have eliquis 5 mg that he could cut in half but if he wants the eliquis 2.5 mg, we will have to call him when we get some samples in. Also if patient needs a refill sent to the pharmacy, we can do that as well.

## 2013-11-05 NOTE — Telephone Encounter (Signed)
LMOM that we have samples of eliquis 5 mg at the front desk.

## 2013-11-05 NOTE — Telephone Encounter (Signed)
Eloquis samples please. Call when ready

## 2013-11-08 ENCOUNTER — Ambulatory Visit: Payer: Self-pay | Admitting: Internal Medicine

## 2013-11-08 DIAGNOSIS — Z794 Long term (current) use of insulin: Secondary | ICD-10-CM | POA: Diagnosis not present

## 2013-11-08 DIAGNOSIS — I129 Hypertensive chronic kidney disease with stage 1 through stage 4 chronic kidney disease, or unspecified chronic kidney disease: Secondary | ICD-10-CM | POA: Diagnosis not present

## 2013-11-08 DIAGNOSIS — I359 Nonrheumatic aortic valve disorder, unspecified: Secondary | ICD-10-CM | POA: Diagnosis not present

## 2013-11-08 DIAGNOSIS — E119 Type 2 diabetes mellitus without complications: Secondary | ICD-10-CM | POA: Diagnosis not present

## 2013-11-08 DIAGNOSIS — D509 Iron deficiency anemia, unspecified: Secondary | ICD-10-CM | POA: Diagnosis not present

## 2013-11-08 DIAGNOSIS — R5383 Other fatigue: Secondary | ICD-10-CM | POA: Diagnosis not present

## 2013-11-08 DIAGNOSIS — Z79899 Other long term (current) drug therapy: Secondary | ICD-10-CM | POA: Diagnosis not present

## 2013-11-08 DIAGNOSIS — I4891 Unspecified atrial fibrillation: Secondary | ICD-10-CM | POA: Diagnosis not present

## 2013-11-08 DIAGNOSIS — I519 Heart disease, unspecified: Secondary | ICD-10-CM | POA: Diagnosis not present

## 2013-11-08 DIAGNOSIS — I2789 Other specified pulmonary heart diseases: Secondary | ICD-10-CM | POA: Diagnosis not present

## 2013-11-08 DIAGNOSIS — Z8551 Personal history of malignant neoplasm of bladder: Secondary | ICD-10-CM | POA: Diagnosis not present

## 2013-11-08 DIAGNOSIS — Z87891 Personal history of nicotine dependence: Secondary | ICD-10-CM | POA: Diagnosis not present

## 2013-11-08 DIAGNOSIS — N189 Chronic kidney disease, unspecified: Secondary | ICD-10-CM | POA: Diagnosis not present

## 2013-11-08 DIAGNOSIS — R5381 Other malaise: Secondary | ICD-10-CM | POA: Diagnosis not present

## 2013-11-08 DIAGNOSIS — R0609 Other forms of dyspnea: Secondary | ICD-10-CM | POA: Diagnosis not present

## 2013-11-08 DIAGNOSIS — Z8546 Personal history of malignant neoplasm of prostate: Secondary | ICD-10-CM | POA: Diagnosis not present

## 2013-11-08 LAB — CBC CANCER CENTER
BASOS ABS: 0.1 x10 3/mm (ref 0.0–0.1)
BASOS PCT: 1 %
Eosinophil #: 0.2 x10 3/mm (ref 0.0–0.7)
Eosinophil %: 3.2 %
HCT: 27.5 % — ABNORMAL LOW (ref 40.0–52.0)
HGB: 8.8 g/dL — ABNORMAL LOW (ref 13.0–18.0)
Lymphocyte #: 1 x10 3/mm (ref 1.0–3.6)
Lymphocyte %: 14.8 %
MCH: 26.9 pg (ref 26.0–34.0)
MCHC: 31.9 g/dL — ABNORMAL LOW (ref 32.0–36.0)
MCV: 84 fL (ref 80–100)
MONOS PCT: 10.2 %
Monocyte #: 0.7 x10 3/mm (ref 0.2–1.0)
NEUTROS PCT: 70.8 %
Neutrophil #: 4.9 x10 3/mm (ref 1.4–6.5)
Platelet: 303 x10 3/mm (ref 150–440)
RBC: 3.26 10*6/uL — ABNORMAL LOW (ref 4.40–5.90)
RDW: 13.6 % (ref 11.5–14.5)
WBC: 6.8 x10 3/mm (ref 3.8–10.6)

## 2013-11-08 LAB — IRON AND TIBC
Iron Bind.Cap.(Total): 360 ug/dL (ref 250–450)
Iron Saturation: 34 %
Iron: 121 ug/dL (ref 65–175)
UNBOUND IRON-BIND. CAP.: 239 ug/dL

## 2013-11-08 LAB — CREATININE, SERUM
Creatinine: 1.84 mg/dL — ABNORMAL HIGH (ref 0.60–1.30)
EGFR (Non-African Amer.): 33 — ABNORMAL LOW
GFR CALC AF AMER: 38 — AB

## 2013-11-08 LAB — FERRITIN: Ferritin (ARMC): 28 ng/mL (ref 8–388)

## 2013-11-10 ENCOUNTER — Encounter: Payer: Self-pay | Admitting: Cardiovascular Disease

## 2013-11-10 ENCOUNTER — Ambulatory Visit (INDEPENDENT_AMBULATORY_CARE_PROVIDER_SITE_OTHER): Payer: Medicare Other | Admitting: Cardiovascular Disease

## 2013-11-10 VITALS — BP 138/60 | Ht 71.0 in | Wt 241.5 lb

## 2013-11-10 DIAGNOSIS — D631 Anemia in chronic kidney disease: Secondary | ICD-10-CM | POA: Insufficient documentation

## 2013-11-10 DIAGNOSIS — I359 Nonrheumatic aortic valve disorder, unspecified: Secondary | ICD-10-CM

## 2013-11-10 DIAGNOSIS — N189 Chronic kidney disease, unspecified: Secondary | ICD-10-CM

## 2013-11-10 DIAGNOSIS — Z952 Presence of prosthetic heart valve: Secondary | ICD-10-CM

## 2013-11-10 DIAGNOSIS — I350 Nonrheumatic aortic (valve) stenosis: Secondary | ICD-10-CM

## 2013-11-10 DIAGNOSIS — Z954 Presence of other heart-valve replacement: Secondary | ICD-10-CM | POA: Diagnosis not present

## 2013-11-10 DIAGNOSIS — E785 Hyperlipidemia, unspecified: Secondary | ICD-10-CM | POA: Diagnosis not present

## 2013-11-10 DIAGNOSIS — D649 Anemia, unspecified: Secondary | ICD-10-CM

## 2013-11-10 DIAGNOSIS — I4891 Unspecified atrial fibrillation: Secondary | ICD-10-CM

## 2013-11-10 DIAGNOSIS — I10 Essential (primary) hypertension: Secondary | ICD-10-CM

## 2013-11-10 NOTE — Assessment & Plan Note (Addendum)
Not on a statin. Most recent lipid panel not available. Goal LDL less than 70 given underlying CAD and bypass in 2009

## 2013-11-10 NOTE — Patient Instructions (Signed)
You are doing well. No medication changes were made.  Please call us if you have new issues that need to be addressed before your next appt.  Your physician wants you to follow-up in: 6 months.  You will receive a reminder letter in the mail two months in advance. If you don't receive a letter, please call our office to schedule the follow-up appointment.   

## 2013-11-10 NOTE — Assessment & Plan Note (Signed)
Followed by Dr. Ma Hillock, of hematology. He reports he is on Epo, as well as low-dose iron. He has followup in clinic once per week with hematology.

## 2013-11-10 NOTE — Assessment & Plan Note (Signed)
He is on low-dose eliquis. Given recent anemia, we will need to watch him closely. He sees hematology once per week. Guaiac studies would be falsely positive as he is on iron. Suggested he call us in the next several weeks if blood count does not improve

## 2013-11-10 NOTE — Assessment & Plan Note (Signed)
Bioprosthetic valve. Repeat echocardiogram next year

## 2013-11-10 NOTE — Assessment & Plan Note (Signed)
Blood pressure is well controlled on today's visit. No changes made to the medications. 

## 2013-11-10 NOTE — Assessment & Plan Note (Signed)
Repeat echocardiogram next year.
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
## 2013-11-15 LAB — CANCER CENTER HEMOGLOBIN: HGB: 8.8 g/dL — AB (ref 13.0–18.0)

## 2013-11-22 LAB — CANCER CENTER HEMOGLOBIN: HGB: 9 g/dL — ABNORMAL LOW (ref 13.0–18.0)

## 2013-11-29 LAB — CANCER CENTER HEMOGLOBIN: HGB: 9.5 g/dL — ABNORMAL LOW (ref 13.0–18.0)

## 2013-12-06 DIAGNOSIS — C61 Malignant neoplasm of prostate: Secondary | ICD-10-CM | POA: Diagnosis not present

## 2013-12-06 DIAGNOSIS — N139 Obstructive and reflux uropathy, unspecified: Secondary | ICD-10-CM | POA: Diagnosis not present

## 2013-12-06 DIAGNOSIS — N3941 Urge incontinence: Secondary | ICD-10-CM | POA: Diagnosis not present

## 2013-12-06 DIAGNOSIS — C679 Malignant neoplasm of bladder, unspecified: Secondary | ICD-10-CM | POA: Diagnosis not present

## 2013-12-07 LAB — CANCER CENTER HEMOGLOBIN: HGB: 9.9 g/dL — ABNORMAL LOW (ref 13.0–18.0)

## 2013-12-08 ENCOUNTER — Ambulatory Visit: Payer: Self-pay | Admitting: Internal Medicine

## 2013-12-08 DIAGNOSIS — L129 Pemphigoid, unspecified: Secondary | ICD-10-CM | POA: Diagnosis not present

## 2013-12-08 DIAGNOSIS — E119 Type 2 diabetes mellitus without complications: Secondary | ICD-10-CM | POA: Diagnosis not present

## 2013-12-08 DIAGNOSIS — I359 Nonrheumatic aortic valve disorder, unspecified: Secondary | ICD-10-CM | POA: Diagnosis not present

## 2013-12-08 DIAGNOSIS — D485 Neoplasm of uncertain behavior of skin: Secondary | ICD-10-CM | POA: Diagnosis not present

## 2013-12-08 DIAGNOSIS — Z794 Long term (current) use of insulin: Secondary | ICD-10-CM | POA: Diagnosis not present

## 2013-12-08 DIAGNOSIS — I129 Hypertensive chronic kidney disease with stage 1 through stage 4 chronic kidney disease, or unspecified chronic kidney disease: Secondary | ICD-10-CM | POA: Diagnosis not present

## 2013-12-08 DIAGNOSIS — R5381 Other malaise: Secondary | ICD-10-CM | POA: Diagnosis not present

## 2013-12-08 DIAGNOSIS — L570 Actinic keratosis: Secondary | ICD-10-CM | POA: Diagnosis not present

## 2013-12-08 DIAGNOSIS — I519 Heart disease, unspecified: Secondary | ICD-10-CM | POA: Diagnosis not present

## 2013-12-08 DIAGNOSIS — C44621 Squamous cell carcinoma of skin of unspecified upper limb, including shoulder: Secondary | ICD-10-CM | POA: Diagnosis not present

## 2013-12-08 DIAGNOSIS — I4891 Unspecified atrial fibrillation: Secondary | ICD-10-CM | POA: Diagnosis not present

## 2013-12-08 DIAGNOSIS — Z8551 Personal history of malignant neoplasm of bladder: Secondary | ICD-10-CM | POA: Diagnosis not present

## 2013-12-08 DIAGNOSIS — Z87891 Personal history of nicotine dependence: Secondary | ICD-10-CM | POA: Diagnosis not present

## 2013-12-08 DIAGNOSIS — R0609 Other forms of dyspnea: Secondary | ICD-10-CM | POA: Diagnosis not present

## 2013-12-08 DIAGNOSIS — R5383 Other fatigue: Secondary | ICD-10-CM | POA: Diagnosis not present

## 2013-12-08 DIAGNOSIS — Z8546 Personal history of malignant neoplasm of prostate: Secondary | ICD-10-CM | POA: Diagnosis not present

## 2013-12-08 DIAGNOSIS — I2789 Other specified pulmonary heart diseases: Secondary | ICD-10-CM | POA: Diagnosis not present

## 2013-12-08 DIAGNOSIS — D509 Iron deficiency anemia, unspecified: Secondary | ICD-10-CM | POA: Diagnosis not present

## 2013-12-08 DIAGNOSIS — Z79899 Other long term (current) drug therapy: Secondary | ICD-10-CM | POA: Diagnosis not present

## 2013-12-08 DIAGNOSIS — N189 Chronic kidney disease, unspecified: Secondary | ICD-10-CM | POA: Diagnosis not present

## 2013-12-13 LAB — CANCER CENTER HEMOGLOBIN: HGB: 10.2 g/dL — AB (ref 13.0–18.0)

## 2013-12-15 DIAGNOSIS — E1165 Type 2 diabetes mellitus with hyperglycemia: Secondary | ICD-10-CM | POA: Diagnosis not present

## 2013-12-15 DIAGNOSIS — E1129 Type 2 diabetes mellitus with other diabetic kidney complication: Secondary | ICD-10-CM | POA: Diagnosis not present

## 2013-12-15 DIAGNOSIS — D649 Anemia, unspecified: Secondary | ICD-10-CM | POA: Diagnosis not present

## 2013-12-21 DIAGNOSIS — D509 Iron deficiency anemia, unspecified: Secondary | ICD-10-CM | POA: Diagnosis not present

## 2013-12-21 LAB — CBC CANCER CENTER
Basophil #: 0.1 x10 3/mm (ref 0.0–0.1)
Basophil %: 0.9 %
EOS PCT: 4.9 %
Eosinophil #: 0.3 x10 3/mm (ref 0.0–0.7)
HCT: 33.1 % — ABNORMAL LOW (ref 40.0–52.0)
HGB: 10.4 g/dL — AB (ref 13.0–18.0)
LYMPHS PCT: 17.8 %
Lymphocyte #: 1 x10 3/mm (ref 1.0–3.6)
MCH: 25.9 pg — AB (ref 26.0–34.0)
MCHC: 31.4 g/dL — ABNORMAL LOW (ref 32.0–36.0)
MCV: 82 fL (ref 80–100)
MONO ABS: 0.7 x10 3/mm (ref 0.2–1.0)
MONOS PCT: 11.9 %
Neutrophil #: 3.6 x10 3/mm (ref 1.4–6.5)
Neutrophil %: 64.5 %
PLATELETS: 240 x10 3/mm (ref 150–440)
RBC: 4.02 10*6/uL — ABNORMAL LOW (ref 4.40–5.90)
RDW: 14.5 % (ref 11.5–14.5)
WBC: 5.6 x10 3/mm (ref 3.8–10.6)

## 2013-12-22 DIAGNOSIS — E119 Type 2 diabetes mellitus without complications: Secondary | ICD-10-CM | POA: Diagnosis not present

## 2013-12-22 DIAGNOSIS — I10 Essential (primary) hypertension: Secondary | ICD-10-CM | POA: Diagnosis not present

## 2013-12-22 DIAGNOSIS — D649 Anemia, unspecified: Secondary | ICD-10-CM | POA: Diagnosis not present

## 2013-12-30 DIAGNOSIS — C44621 Squamous cell carcinoma of skin of unspecified upper limb, including shoulder: Secondary | ICD-10-CM | POA: Diagnosis not present

## 2014-01-04 LAB — CANCER CENTER HEMOGLOBIN: HGB: 10.5 g/dL — AB (ref 13.0–18.0)

## 2014-01-08 ENCOUNTER — Ambulatory Visit: Payer: Self-pay | Admitting: Internal Medicine

## 2014-01-08 DIAGNOSIS — Z8546 Personal history of malignant neoplasm of prostate: Secondary | ICD-10-CM | POA: Diagnosis not present

## 2014-01-08 DIAGNOSIS — E119 Type 2 diabetes mellitus without complications: Secondary | ICD-10-CM | POA: Diagnosis not present

## 2014-01-08 DIAGNOSIS — I2789 Other specified pulmonary heart diseases: Secondary | ICD-10-CM | POA: Diagnosis not present

## 2014-01-08 DIAGNOSIS — R0609 Other forms of dyspnea: Secondary | ICD-10-CM | POA: Diagnosis not present

## 2014-01-08 DIAGNOSIS — Z79899 Other long term (current) drug therapy: Secondary | ICD-10-CM | POA: Diagnosis not present

## 2014-01-08 DIAGNOSIS — Z8551 Personal history of malignant neoplasm of bladder: Secondary | ICD-10-CM | POA: Diagnosis not present

## 2014-01-08 DIAGNOSIS — N189 Chronic kidney disease, unspecified: Secondary | ICD-10-CM | POA: Diagnosis not present

## 2014-01-08 DIAGNOSIS — R5381 Other malaise: Secondary | ICD-10-CM | POA: Diagnosis not present

## 2014-01-08 DIAGNOSIS — I359 Nonrheumatic aortic valve disorder, unspecified: Secondary | ICD-10-CM | POA: Diagnosis not present

## 2014-01-08 DIAGNOSIS — I129 Hypertensive chronic kidney disease with stage 1 through stage 4 chronic kidney disease, or unspecified chronic kidney disease: Secondary | ICD-10-CM | POA: Diagnosis not present

## 2014-01-08 DIAGNOSIS — Z794 Long term (current) use of insulin: Secondary | ICD-10-CM | POA: Diagnosis not present

## 2014-01-08 DIAGNOSIS — D509 Iron deficiency anemia, unspecified: Secondary | ICD-10-CM | POA: Diagnosis not present

## 2014-01-08 DIAGNOSIS — I4891 Unspecified atrial fibrillation: Secondary | ICD-10-CM | POA: Diagnosis not present

## 2014-01-08 DIAGNOSIS — Z87891 Personal history of nicotine dependence: Secondary | ICD-10-CM | POA: Diagnosis not present

## 2014-01-08 DIAGNOSIS — I519 Heart disease, unspecified: Secondary | ICD-10-CM | POA: Diagnosis not present

## 2014-01-18 LAB — CANCER CENTER HEMOGLOBIN: HGB: 10.3 g/dL — AB (ref 13.0–18.0)

## 2014-01-27 DIAGNOSIS — C679 Malignant neoplasm of bladder, unspecified: Secondary | ICD-10-CM | POA: Diagnosis not present

## 2014-01-27 DIAGNOSIS — C61 Malignant neoplasm of prostate: Secondary | ICD-10-CM | POA: Diagnosis not present

## 2014-01-28 DIAGNOSIS — C679 Malignant neoplasm of bladder, unspecified: Secondary | ICD-10-CM | POA: Diagnosis not present

## 2014-02-01 LAB — CANCER CENTER HEMOGLOBIN: HGB: 9.8 g/dL — AB (ref 13.0–18.0)

## 2014-02-07 DIAGNOSIS — L851 Acquired keratosis [keratoderma] palmaris et plantaris: Secondary | ICD-10-CM | POA: Diagnosis not present

## 2014-02-07 DIAGNOSIS — B351 Tinea unguium: Secondary | ICD-10-CM | POA: Diagnosis not present

## 2014-02-07 DIAGNOSIS — E119 Type 2 diabetes mellitus without complications: Secondary | ICD-10-CM | POA: Diagnosis not present

## 2014-02-08 ENCOUNTER — Ambulatory Visit: Payer: Self-pay | Admitting: Internal Medicine

## 2014-02-08 DIAGNOSIS — D509 Iron deficiency anemia, unspecified: Secondary | ICD-10-CM | POA: Diagnosis not present

## 2014-02-09 DIAGNOSIS — Z1211 Encounter for screening for malignant neoplasm of colon: Secondary | ICD-10-CM | POA: Diagnosis not present

## 2014-02-10 ENCOUNTER — Telehealth: Payer: Self-pay

## 2014-02-10 NOTE — Telephone Encounter (Signed)
Spoke w/ Dr. Acie Fredrickson, who reports that pt has a slow GI bleed, anemia, decreased Hgb, and hem + stool cards.  Reports that he advised pt to hold his Eliquis 2.5mg  BID and see how he does.

## 2014-02-15 DIAGNOSIS — D649 Anemia, unspecified: Secondary | ICD-10-CM | POA: Diagnosis not present

## 2014-02-15 NOTE — Telephone Encounter (Signed)
I would agree with holding the anticoagulation He may want to talk with his primary care, even gastroenterology There is certainly a risk with standing on a blood thinner, Also a risk stopping the blood thinner in terms of his atrial fibrillation

## 2014-02-17 DIAGNOSIS — C679 Malignant neoplasm of bladder, unspecified: Secondary | ICD-10-CM | POA: Diagnosis not present

## 2014-03-01 LAB — CANCER CENTER HEMOGLOBIN: HGB: 10.2 g/dL — AB (ref 13.0–18.0)

## 2014-03-15 ENCOUNTER — Ambulatory Visit: Payer: Self-pay | Admitting: Internal Medicine

## 2014-03-15 DIAGNOSIS — D509 Iron deficiency anemia, unspecified: Secondary | ICD-10-CM | POA: Diagnosis not present

## 2014-03-15 LAB — IRON AND TIBC
IRON SATURATION: 20 %
Iron Bind.Cap.(Total): 332 ug/dL (ref 250–450)
Iron: 68 ug/dL (ref 65–175)
UNBOUND IRON-BIND. CAP.: 264 ug/dL

## 2014-03-15 LAB — FERRITIN: Ferritin (ARMC): 24 ng/mL (ref 8–388)

## 2014-03-15 LAB — FOLATE: Folic Acid: 45.9 ng/mL (ref 3.1–100.0)

## 2014-03-15 LAB — CANCER CENTER HEMOGLOBIN: HGB: 10.3 g/dL — AB (ref 13.0–18.0)

## 2014-03-16 DIAGNOSIS — H3530 Unspecified macular degeneration: Secondary | ICD-10-CM | POA: Diagnosis not present

## 2014-03-16 DIAGNOSIS — E119 Type 2 diabetes mellitus without complications: Secondary | ICD-10-CM | POA: Diagnosis not present

## 2014-03-21 DIAGNOSIS — Z23 Encounter for immunization: Secondary | ICD-10-CM | POA: Diagnosis not present

## 2014-03-22 DIAGNOSIS — E119 Type 2 diabetes mellitus without complications: Secondary | ICD-10-CM | POA: Diagnosis not present

## 2014-03-29 DIAGNOSIS — Z23 Encounter for immunization: Secondary | ICD-10-CM | POA: Diagnosis not present

## 2014-03-29 DIAGNOSIS — D509 Iron deficiency anemia, unspecified: Secondary | ICD-10-CM | POA: Diagnosis not present

## 2014-03-29 DIAGNOSIS — D649 Anemia, unspecified: Secondary | ICD-10-CM | POA: Diagnosis not present

## 2014-03-29 DIAGNOSIS — E119 Type 2 diabetes mellitus without complications: Secondary | ICD-10-CM | POA: Diagnosis not present

## 2014-03-29 DIAGNOSIS — I10 Essential (primary) hypertension: Secondary | ICD-10-CM | POA: Diagnosis not present

## 2014-03-29 LAB — CANCER CENTER HEMOGLOBIN: HGB: 10.5 g/dL — AB (ref 13.0–18.0)

## 2014-03-31 DIAGNOSIS — R195 Other fecal abnormalities: Secondary | ICD-10-CM | POA: Diagnosis not present

## 2014-04-04 ENCOUNTER — Ambulatory Visit: Payer: Self-pay | Admitting: Gastroenterology

## 2014-04-04 DIAGNOSIS — R195 Other fecal abnormalities: Secondary | ICD-10-CM | POA: Diagnosis not present

## 2014-04-04 DIAGNOSIS — D649 Anemia, unspecified: Secondary | ICD-10-CM | POA: Diagnosis not present

## 2014-04-04 DIAGNOSIS — K228 Other specified diseases of esophagus: Secondary | ICD-10-CM | POA: Diagnosis not present

## 2014-04-10 ENCOUNTER — Ambulatory Visit: Payer: Self-pay | Admitting: Internal Medicine

## 2014-04-12 ENCOUNTER — Ambulatory Visit: Payer: Self-pay | Admitting: Internal Medicine

## 2014-04-12 DIAGNOSIS — N189 Chronic kidney disease, unspecified: Secondary | ICD-10-CM | POA: Diagnosis not present

## 2014-04-12 DIAGNOSIS — I4891 Unspecified atrial fibrillation: Secondary | ICD-10-CM | POA: Diagnosis not present

## 2014-04-12 DIAGNOSIS — Z951 Presence of aortocoronary bypass graft: Secondary | ICD-10-CM | POA: Diagnosis not present

## 2014-04-12 DIAGNOSIS — I272 Other secondary pulmonary hypertension: Secondary | ICD-10-CM | POA: Diagnosis not present

## 2014-04-12 DIAGNOSIS — I129 Hypertensive chronic kidney disease with stage 1 through stage 4 chronic kidney disease, or unspecified chronic kidney disease: Secondary | ICD-10-CM | POA: Diagnosis not present

## 2014-04-12 DIAGNOSIS — Z87891 Personal history of nicotine dependence: Secondary | ICD-10-CM | POA: Diagnosis not present

## 2014-04-12 DIAGNOSIS — Z79899 Other long term (current) drug therapy: Secondary | ICD-10-CM | POA: Diagnosis not present

## 2014-04-12 DIAGNOSIS — Z8546 Personal history of malignant neoplasm of prostate: Secondary | ICD-10-CM | POA: Diagnosis not present

## 2014-04-12 DIAGNOSIS — E119 Type 2 diabetes mellitus without complications: Secondary | ICD-10-CM | POA: Diagnosis not present

## 2014-04-12 DIAGNOSIS — Z8719 Personal history of other diseases of the digestive system: Secondary | ICD-10-CM | POA: Diagnosis not present

## 2014-04-12 DIAGNOSIS — Z8551 Personal history of malignant neoplasm of bladder: Secondary | ICD-10-CM | POA: Diagnosis not present

## 2014-04-12 DIAGNOSIS — I519 Heart disease, unspecified: Secondary | ICD-10-CM | POA: Diagnosis not present

## 2014-04-12 DIAGNOSIS — Z794 Long term (current) use of insulin: Secondary | ICD-10-CM | POA: Diagnosis not present

## 2014-04-12 DIAGNOSIS — D631 Anemia in chronic kidney disease: Secondary | ICD-10-CM | POA: Diagnosis not present

## 2014-04-12 DIAGNOSIS — I350 Nonrheumatic aortic (valve) stenosis: Secondary | ICD-10-CM | POA: Diagnosis not present

## 2014-04-12 DIAGNOSIS — D509 Iron deficiency anemia, unspecified: Secondary | ICD-10-CM | POA: Diagnosis not present

## 2014-04-12 DIAGNOSIS — R0600 Dyspnea, unspecified: Secondary | ICD-10-CM | POA: Diagnosis not present

## 2014-04-12 LAB — CBC CANCER CENTER
BASOS PCT: 1.1 %
Basophil #: 0.1 x10 3/mm (ref 0.0–0.1)
EOS ABS: 0.3 x10 3/mm (ref 0.0–0.7)
Eosinophil %: 5.2 %
HCT: 32.4 % — AB (ref 40.0–52.0)
HGB: 10.5 g/dL — ABNORMAL LOW (ref 13.0–18.0)
LYMPHS ABS: 1.2 x10 3/mm (ref 1.0–3.6)
Lymphocyte %: 18.8 %
MCH: 27.3 pg (ref 26.0–34.0)
MCHC: 32.3 g/dL (ref 32.0–36.0)
MCV: 85 fL (ref 80–100)
MONO ABS: 0.6 x10 3/mm (ref 0.2–1.0)
Monocyte %: 10 %
NEUTROS ABS: 4 x10 3/mm (ref 1.4–6.5)
Neutrophil %: 64.9 %
Platelet: 251 x10 3/mm (ref 150–440)
RBC: 3.84 10*6/uL — AB (ref 4.40–5.90)
RDW: 15.9 % — AB (ref 11.5–14.5)
WBC: 6.2 x10 3/mm (ref 3.8–10.6)

## 2014-04-21 ENCOUNTER — Ambulatory Visit: Payer: Self-pay | Admitting: Gastroenterology

## 2014-04-21 DIAGNOSIS — K228 Other specified diseases of esophagus: Secondary | ICD-10-CM | POA: Diagnosis not present

## 2014-04-21 DIAGNOSIS — R1310 Dysphagia, unspecified: Secondary | ICD-10-CM | POA: Diagnosis not present

## 2014-04-27 ENCOUNTER — Telehealth: Payer: Self-pay

## 2014-04-27 NOTE — Telephone Encounter (Signed)
Pt wife called states pt is on Lantus, (insulin), States the drug co, will not deliver to [facility] because they are affiliated with [place]. She asks if Dr. Rockey Situ will receive this medication at the office here, and they can pick it up She states it does not have to be delivered to a PCP, and Dr. Rockey Situ is the closest Dr. To them..Please call and advise.

## 2014-04-27 NOTE — Telephone Encounter (Signed)
Spoke w/ pt's wife.  Advised her that we cannot accept responsibility for pt's lantus, as we did not order this med. Advised her to contact pt's PCP or endocrinologist. She verbalizes understanding and will call back if we can be of further assistance.

## 2014-04-28 ENCOUNTER — Telehealth: Payer: Self-pay

## 2014-04-28 NOTE — Telephone Encounter (Signed)
Spoke w/ pt's wife.  Advised her that insulin is an endocrinology medication and will need to be ordered thru endo or PCP. She verbalizes understanding and will call back if we can be of further assistance.

## 2014-04-28 NOTE — Telephone Encounter (Signed)
Pt wife called back, wants to know if Dr. Rockey Situ can order an A1C and then prescribe the Lantus

## 2014-05-10 ENCOUNTER — Encounter: Payer: Self-pay | Admitting: Cardiovascular Disease

## 2014-05-10 ENCOUNTER — Ambulatory Visit (INDEPENDENT_AMBULATORY_CARE_PROVIDER_SITE_OTHER): Payer: Medicare Other | Admitting: Cardiovascular Disease

## 2014-05-10 ENCOUNTER — Ambulatory Visit: Payer: Self-pay | Admitting: Internal Medicine

## 2014-05-10 VITALS — BP 138/64 | HR 72 | Ht 70.0 in | Wt 241.2 lb

## 2014-05-10 DIAGNOSIS — Z952 Presence of prosthetic heart valve: Secondary | ICD-10-CM

## 2014-05-10 DIAGNOSIS — D638 Anemia in other chronic diseases classified elsewhere: Secondary | ICD-10-CM | POA: Diagnosis not present

## 2014-05-10 DIAGNOSIS — I4891 Unspecified atrial fibrillation: Secondary | ICD-10-CM | POA: Diagnosis not present

## 2014-05-10 DIAGNOSIS — I10 Essential (primary) hypertension: Secondary | ICD-10-CM

## 2014-05-10 DIAGNOSIS — D649 Anemia, unspecified: Secondary | ICD-10-CM

## 2014-05-10 DIAGNOSIS — K922 Gastrointestinal hemorrhage, unspecified: Secondary | ICD-10-CM | POA: Diagnosis not present

## 2014-05-10 DIAGNOSIS — Z954 Presence of other heart-valve replacement: Secondary | ICD-10-CM

## 2014-05-10 DIAGNOSIS — I129 Hypertensive chronic kidney disease with stage 1 through stage 4 chronic kidney disease, or unspecified chronic kidney disease: Secondary | ICD-10-CM | POA: Diagnosis not present

## 2014-05-10 DIAGNOSIS — N189 Chronic kidney disease, unspecified: Secondary | ICD-10-CM | POA: Diagnosis not present

## 2014-05-10 DIAGNOSIS — Z79899 Other long term (current) drug therapy: Secondary | ICD-10-CM | POA: Diagnosis not present

## 2014-05-10 DIAGNOSIS — D500 Iron deficiency anemia secondary to blood loss (chronic): Secondary | ICD-10-CM | POA: Diagnosis not present

## 2014-05-10 NOTE — Assessment & Plan Note (Signed)
Chronic atrial fibrillation over the past 3 years, heart rate well controlled. Not on anticoagulation given anemia, possible GI bleeding, patient's decision

## 2014-05-10 NOTE — Patient Instructions (Signed)
You are doing well.  Please skip the lasix/furosemide on Saturday and Sunday Continue lasix 5 days a week  Please call us if you have new issues that need to be addressed before your next appt.  Your physician wants you to follow-up in: 6 months.  You will receive a reminder letter in the mail two months in advance. If you don't receive a letter, please call our office to schedule the follow-up appointment.

## 2014-05-10 NOTE — Assessment & Plan Note (Signed)
Followed by Dr. Ma Hillock. Blood count has been relatively stable July and October

## 2014-05-10 NOTE — Assessment & Plan Note (Signed)
Aortic valve is well seated, functioning normally on echocardiogram December 2014. Repeat echocardiogram in 2016

## 2014-05-10 NOTE — Assessment & Plan Note (Signed)
Currently not on anticoagulation given his anemia. He understands the risk and benefit of not being on anticoagulation. Hematocrit has stabilized greater than 10

## 2014-05-10 NOTE — Assessment & Plan Note (Signed)
Blood pressure is well controlled on today's visit. No changes made to the medications. 
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
## 2014-05-12 DIAGNOSIS — B351 Tinea unguium: Secondary | ICD-10-CM | POA: Diagnosis not present

## 2014-05-12 DIAGNOSIS — L851 Acquired keratosis [keratoderma] palmaris et plantaris: Secondary | ICD-10-CM | POA: Diagnosis not present

## 2014-05-12 DIAGNOSIS — E119 Type 2 diabetes mellitus without complications: Secondary | ICD-10-CM | POA: Diagnosis not present

## 2014-06-07 LAB — CANCER CENTER HEMOGLOBIN: HGB: 9.3 g/dL — AB (ref 13.0–18.0)

## 2014-06-10 ENCOUNTER — Ambulatory Visit: Payer: Self-pay | Admitting: Internal Medicine

## 2014-06-10 DIAGNOSIS — I639 Cerebral infarction, unspecified: Secondary | ICD-10-CM

## 2014-06-10 HISTORY — DX: Cerebral infarction, unspecified: I63.9

## 2014-06-20 ENCOUNTER — Ambulatory Visit: Payer: Self-pay | Admitting: Internal Medicine

## 2014-06-27 DIAGNOSIS — I10 Essential (primary) hypertension: Secondary | ICD-10-CM | POA: Diagnosis not present

## 2014-06-27 DIAGNOSIS — I6523 Occlusion and stenosis of bilateral carotid arteries: Secondary | ICD-10-CM | POA: Diagnosis not present

## 2014-06-27 DIAGNOSIS — R531 Weakness: Secondary | ICD-10-CM | POA: Diagnosis not present

## 2014-06-27 DIAGNOSIS — I482 Chronic atrial fibrillation: Secondary | ICD-10-CM | POA: Diagnosis not present

## 2014-06-27 DIAGNOSIS — R0602 Shortness of breath: Secondary | ICD-10-CM | POA: Diagnosis not present

## 2014-06-27 DIAGNOSIS — I4891 Unspecified atrial fibrillation: Secondary | ICD-10-CM | POA: Diagnosis not present

## 2014-06-27 DIAGNOSIS — E119 Type 2 diabetes mellitus without complications: Secondary | ICD-10-CM | POA: Diagnosis not present

## 2014-06-27 DIAGNOSIS — I639 Cerebral infarction, unspecified: Secondary | ICD-10-CM | POA: Diagnosis not present

## 2014-06-27 DIAGNOSIS — I517 Cardiomegaly: Secondary | ICD-10-CM | POA: Diagnosis not present

## 2014-06-27 DIAGNOSIS — G9389 Other specified disorders of brain: Secondary | ICD-10-CM | POA: Diagnosis not present

## 2014-06-27 DIAGNOSIS — R9082 White matter disease, unspecified: Secondary | ICD-10-CM | POA: Diagnosis not present

## 2014-06-27 DIAGNOSIS — I638 Other cerebral infarction: Secondary | ICD-10-CM | POA: Diagnosis not present

## 2014-06-27 LAB — LIPID PANEL
Cholesterol: 135 mg/dL (ref 0–200)
HDL Cholesterol: 54 mg/dL (ref 40–60)
LDL CHOLESTEROL, CALC: 59 mg/dL (ref 0–100)
TRIGLYCERIDES: 109 mg/dL (ref 0–200)
VLDL CHOLESTEROL, CALC: 22 mg/dL (ref 5–40)

## 2014-06-27 LAB — BASIC METABOLIC PANEL
Anion Gap: 7 (ref 7–16)
BUN: 30 mg/dL — ABNORMAL HIGH (ref 7–18)
CHLORIDE: 104 mmol/L (ref 98–107)
CO2: 25 mmol/L (ref 21–32)
Calcium, Total: 8.4 mg/dL — ABNORMAL LOW (ref 8.5–10.1)
Creatinine: 1.88 mg/dL — ABNORMAL HIGH (ref 0.60–1.30)
EGFR (African American): 44 — ABNORMAL LOW
EGFR (Non-African Amer.): 37 — ABNORMAL LOW
Glucose: 131 mg/dL — ABNORMAL HIGH (ref 65–99)
Osmolality: 280 (ref 275–301)
Potassium: 4.7 mmol/L (ref 3.5–5.1)
SODIUM: 136 mmol/L (ref 136–145)

## 2014-06-27 LAB — CBC WITH DIFFERENTIAL/PLATELET
BASOS PCT: 1.1 %
Basophil #: 0.1 10*3/uL (ref 0.0–0.1)
Eosinophil #: 0.6 10*3/uL (ref 0.0–0.7)
Eosinophil %: 7.4 %
HCT: 32.9 % — ABNORMAL LOW (ref 40.0–52.0)
HGB: 10.4 g/dL — AB (ref 13.0–18.0)
LYMPHS ABS: 1.2 10*3/uL (ref 1.0–3.6)
Lymphocyte %: 14.7 %
MCH: 26.8 pg (ref 26.0–34.0)
MCHC: 31.7 g/dL — AB (ref 32.0–36.0)
MCV: 84 fL (ref 80–100)
MONO ABS: 0.8 x10 3/mm (ref 0.2–1.0)
Monocyte %: 9.9 %
Neutrophil #: 5.5 10*3/uL (ref 1.4–6.5)
Neutrophil %: 66.9 %
Platelet: 239 10*3/uL (ref 150–440)
RBC: 3.9 10*6/uL — ABNORMAL LOW (ref 4.40–5.90)
RDW: 15.4 % — ABNORMAL HIGH (ref 11.5–14.5)
WBC: 8.3 10*3/uL (ref 3.8–10.6)

## 2014-06-27 LAB — PROTIME-INR
INR: 1
Prothrombin Time: 13.2 secs (ref 11.5–14.7)

## 2014-06-27 LAB — TROPONIN I: Troponin-I: 0.02 ng/mL

## 2014-06-28 ENCOUNTER — Inpatient Hospital Stay: Payer: Self-pay | Admitting: Internal Medicine

## 2014-06-28 DIAGNOSIS — I272 Other secondary pulmonary hypertension: Secondary | ICD-10-CM | POA: Diagnosis not present

## 2014-06-28 DIAGNOSIS — Z888 Allergy status to other drugs, medicaments and biological substances status: Secondary | ICD-10-CM | POA: Diagnosis not present

## 2014-06-28 DIAGNOSIS — R200 Anesthesia of skin: Secondary | ICD-10-CM | POA: Diagnosis not present

## 2014-06-28 DIAGNOSIS — K922 Gastrointestinal hemorrhage, unspecified: Secondary | ICD-10-CM | POA: Diagnosis present

## 2014-06-28 DIAGNOSIS — M549 Dorsalgia, unspecified: Secondary | ICD-10-CM | POA: Diagnosis present

## 2014-06-28 DIAGNOSIS — I4891 Unspecified atrial fibrillation: Secondary | ICD-10-CM | POA: Diagnosis not present

## 2014-06-28 DIAGNOSIS — Q2733 Arteriovenous malformation of digestive system vessel: Secondary | ICD-10-CM | POA: Diagnosis not present

## 2014-06-28 DIAGNOSIS — E119 Type 2 diabetes mellitus without complications: Secondary | ICD-10-CM | POA: Diagnosis not present

## 2014-06-28 DIAGNOSIS — Z8551 Personal history of malignant neoplasm of bladder: Secondary | ICD-10-CM | POA: Diagnosis not present

## 2014-06-28 DIAGNOSIS — Z880 Allergy status to penicillin: Secondary | ICD-10-CM | POA: Diagnosis not present

## 2014-06-28 DIAGNOSIS — E1165 Type 2 diabetes mellitus with hyperglycemia: Secondary | ICD-10-CM | POA: Diagnosis not present

## 2014-06-28 DIAGNOSIS — R9082 White matter disease, unspecified: Secondary | ICD-10-CM | POA: Diagnosis not present

## 2014-06-28 DIAGNOSIS — D509 Iron deficiency anemia, unspecified: Secondary | ICD-10-CM | POA: Diagnosis present

## 2014-06-28 DIAGNOSIS — Z8546 Personal history of malignant neoplasm of prostate: Secondary | ICD-10-CM | POA: Diagnosis not present

## 2014-06-28 DIAGNOSIS — G8929 Other chronic pain: Secondary | ICD-10-CM | POA: Diagnosis present

## 2014-06-28 DIAGNOSIS — M109 Gout, unspecified: Secondary | ICD-10-CM | POA: Diagnosis present

## 2014-06-28 DIAGNOSIS — Z87891 Personal history of nicotine dependence: Secondary | ICD-10-CM | POA: Diagnosis not present

## 2014-06-28 DIAGNOSIS — I25119 Atherosclerotic heart disease of native coronary artery with unspecified angina pectoris: Secondary | ICD-10-CM | POA: Diagnosis present

## 2014-06-28 DIAGNOSIS — G9389 Other specified disorders of brain: Secondary | ICD-10-CM | POA: Diagnosis not present

## 2014-06-28 DIAGNOSIS — I639 Cerebral infarction, unspecified: Secondary | ICD-10-CM | POA: Diagnosis not present

## 2014-06-28 DIAGNOSIS — E785 Hyperlipidemia, unspecified: Secondary | ICD-10-CM | POA: Diagnosis present

## 2014-06-28 DIAGNOSIS — Z886 Allergy status to analgesic agent status: Secondary | ICD-10-CM | POA: Diagnosis not present

## 2014-06-28 DIAGNOSIS — E780 Pure hypercholesterolemia: Secondary | ICD-10-CM | POA: Diagnosis present

## 2014-06-28 DIAGNOSIS — N289 Disorder of kidney and ureter, unspecified: Secondary | ICD-10-CM | POA: Diagnosis present

## 2014-06-28 DIAGNOSIS — R0602 Shortness of breath: Secondary | ICD-10-CM | POA: Diagnosis not present

## 2014-06-28 DIAGNOSIS — Z953 Presence of xenogenic heart valve: Secondary | ICD-10-CM | POA: Diagnosis not present

## 2014-06-28 DIAGNOSIS — I482 Chronic atrial fibrillation: Secondary | ICD-10-CM | POA: Diagnosis present

## 2014-06-28 DIAGNOSIS — I517 Cardiomegaly: Secondary | ICD-10-CM | POA: Diagnosis not present

## 2014-06-28 DIAGNOSIS — J441 Chronic obstructive pulmonary disease with (acute) exacerbation: Secondary | ICD-10-CM | POA: Diagnosis present

## 2014-06-28 DIAGNOSIS — I10 Essential (primary) hypertension: Secondary | ICD-10-CM | POA: Diagnosis present

## 2014-06-28 LAB — CBC WITH DIFFERENTIAL/PLATELET
Basophil #: 0.1 10*3/uL (ref 0.0–0.1)
Basophil %: 0.8 %
Eosinophil #: 0.3 10*3/uL (ref 0.0–0.7)
Eosinophil %: 2.8 %
HCT: 31 % — ABNORMAL LOW (ref 40.0–52.0)
HGB: 9.8 g/dL — ABNORMAL LOW (ref 13.0–18.0)
Lymphocyte #: 0.8 10*3/uL — ABNORMAL LOW (ref 1.0–3.6)
Lymphocyte %: 8.9 %
MCH: 26.8 pg (ref 26.0–34.0)
MCHC: 31.5 g/dL — ABNORMAL LOW (ref 32.0–36.0)
MCV: 85 fL (ref 80–100)
Monocyte #: 0.9 x10 3/mm (ref 0.2–1.0)
Monocyte %: 9.4 %
Neutrophil #: 7.4 10*3/uL — ABNORMAL HIGH (ref 1.4–6.5)
Neutrophil %: 78.1 %
Platelet: 238 10*3/uL (ref 150–440)
RBC: 3.65 10*6/uL — ABNORMAL LOW (ref 4.40–5.90)
RDW: 15 % — ABNORMAL HIGH (ref 11.5–14.5)
WBC: 9.5 10*3/uL (ref 3.8–10.6)

## 2014-06-28 LAB — TSH
TSH: 2.25 u[IU]/mL (ref 0.41–5.90)
Thyroid Stimulating Horm: 2.25 u[IU]/mL

## 2014-06-28 LAB — LIPID PANEL
CHOLESTEROL: 127 mg/dL (ref 0–200)
Cholesterol: 127 mg/dL (ref 0–200)
HDL Cholesterol: 59 mg/dL (ref 40–60)
HDL: 59 mg/dL (ref 35–70)
LDL Cholesterol: 57 mg/dL
Ldl Cholesterol, Calc: 57 mg/dL (ref 0–100)
TRIGLYCERIDES: 56 mg/dL (ref 40–160)
Triglycerides: 56 mg/dL (ref 0–200)
VLDL Cholesterol, Calc: 11 mg/dL (ref 5–40)

## 2014-06-29 ENCOUNTER — Inpatient Hospital Stay (HOSPITAL_COMMUNITY)
Admission: RE | Admit: 2014-06-29 | Discharge: 2014-07-09 | DRG: 056 | Disposition: A | Discharge status: Home Health | Payer: Medicare Other | Source: Intra-hospital | Attending: Physical Medicine & Rehabilitation | Admitting: Physical Medicine & Rehabilitation

## 2014-06-29 ENCOUNTER — Inpatient Hospital Stay (HOSPITAL_COMMUNITY): Payer: Medicare Other

## 2014-06-29 ENCOUNTER — Encounter: Payer: Self-pay | Admitting: *Deleted

## 2014-06-29 ENCOUNTER — Encounter (HOSPITAL_COMMUNITY): Payer: Self-pay | Admitting: *Deleted

## 2014-06-29 DIAGNOSIS — E785 Hyperlipidemia, unspecified: Secondary | ICD-10-CM | POA: Diagnosis present

## 2014-06-29 DIAGNOSIS — Z954 Presence of other heart-valve replacement: Secondary | ICD-10-CM | POA: Diagnosis not present

## 2014-06-29 DIAGNOSIS — D649 Anemia, unspecified: Secondary | ICD-10-CM | POA: Diagnosis present

## 2014-06-29 DIAGNOSIS — J9809 Other diseases of bronchus, not elsewhere classified: Secondary | ICD-10-CM | POA: Diagnosis not present

## 2014-06-29 DIAGNOSIS — D631 Anemia in chronic kidney disease: Secondary | ICD-10-CM | POA: Diagnosis present

## 2014-06-29 DIAGNOSIS — Z953 Presence of xenogenic heart valve: Secondary | ICD-10-CM

## 2014-06-29 DIAGNOSIS — Z87891 Personal history of nicotine dependence: Secondary | ICD-10-CM

## 2014-06-29 DIAGNOSIS — I288 Other diseases of pulmonary vessels: Secondary | ICD-10-CM | POA: Diagnosis not present

## 2014-06-29 DIAGNOSIS — G8190 Hemiplegia, unspecified affecting unspecified side: Secondary | ICD-10-CM

## 2014-06-29 DIAGNOSIS — E1142 Type 2 diabetes mellitus with diabetic polyneuropathy: Secondary | ICD-10-CM | POA: Diagnosis present

## 2014-06-29 DIAGNOSIS — E1140 Type 2 diabetes mellitus with diabetic neuropathy, unspecified: Secondary | ICD-10-CM | POA: Diagnosis not present

## 2014-06-29 DIAGNOSIS — K922 Gastrointestinal hemorrhage, unspecified: Secondary | ICD-10-CM | POA: Diagnosis not present

## 2014-06-29 DIAGNOSIS — E1165 Type 2 diabetes mellitus with hyperglycemia: Secondary | ICD-10-CM | POA: Diagnosis present

## 2014-06-29 DIAGNOSIS — E119 Type 2 diabetes mellitus without complications: Secondary | ICD-10-CM | POA: Diagnosis not present

## 2014-06-29 DIAGNOSIS — I4891 Unspecified atrial fibrillation: Secondary | ICD-10-CM | POA: Diagnosis present

## 2014-06-29 DIAGNOSIS — I69334 Monoplegia of upper limb following cerebral infarction affecting left non-dominant side: Principal | ICD-10-CM

## 2014-06-29 DIAGNOSIS — N189 Chronic kidney disease, unspecified: Secondary | ICD-10-CM | POA: Diagnosis present

## 2014-06-29 DIAGNOSIS — I2510 Atherosclerotic heart disease of native coronary artery without angina pectoris: Secondary | ICD-10-CM | POA: Diagnosis present

## 2014-06-29 DIAGNOSIS — G8194 Hemiplegia, unspecified affecting left nondominant side: Secondary | ICD-10-CM

## 2014-06-29 DIAGNOSIS — Z951 Presence of aortocoronary bypass graft: Secondary | ICD-10-CM

## 2014-06-29 DIAGNOSIS — R200 Anesthesia of skin: Secondary | ICD-10-CM | POA: Diagnosis not present

## 2014-06-29 DIAGNOSIS — I129 Hypertensive chronic kidney disease with stage 1 through stage 4 chronic kidney disease, or unspecified chronic kidney disease: Secondary | ICD-10-CM | POA: Diagnosis present

## 2014-06-29 DIAGNOSIS — R062 Wheezing: Secondary | ICD-10-CM

## 2014-06-29 DIAGNOSIS — I639 Cerebral infarction, unspecified: Secondary | ICD-10-CM | POA: Diagnosis not present

## 2014-06-29 DIAGNOSIS — R451 Restlessness and agitation: Secondary | ICD-10-CM | POA: Diagnosis present

## 2014-06-29 DIAGNOSIS — I63429 Cerebral infarction due to embolism of unspecified anterior cerebral artery: Secondary | ICD-10-CM

## 2014-06-29 DIAGNOSIS — I482 Chronic atrial fibrillation, unspecified: Secondary | ICD-10-CM | POA: Diagnosis present

## 2014-06-29 DIAGNOSIS — I10 Essential (primary) hypertension: Secondary | ICD-10-CM | POA: Diagnosis not present

## 2014-06-29 DIAGNOSIS — K31819 Angiodysplasia of stomach and duodenum without bleeding: Secondary | ICD-10-CM | POA: Diagnosis present

## 2014-06-29 DIAGNOSIS — K5521 Angiodysplasia of colon with hemorrhage: Secondary | ICD-10-CM | POA: Diagnosis present

## 2014-06-29 DIAGNOSIS — IMO0002 Reserved for concepts with insufficient information to code with codable children: Secondary | ICD-10-CM | POA: Diagnosis present

## 2014-06-29 HISTORY — DX: Unspecified macular degeneration: H35.30

## 2014-06-29 LAB — CBC WITH DIFFERENTIAL/PLATELET
BASOS PCT: 0.7 %
Basophil #: 0.1 10*3/uL (ref 0.0–0.1)
Eosinophil #: 0.4 10*3/uL (ref 0.0–0.7)
Eosinophil %: 5.6 %
HCT: 29.5 % — ABNORMAL LOW (ref 40.0–52.0)
HGB: 9.4 g/dL — ABNORMAL LOW (ref 13.0–18.0)
Lymphocyte #: 1.1 10*3/uL (ref 1.0–3.6)
Lymphocyte %: 14.3 %
MCH: 26.9 pg (ref 26.0–34.0)
MCHC: 31.9 g/dL — AB (ref 32.0–36.0)
MCV: 84 fL (ref 80–100)
MONOS PCT: 11.5 %
Monocyte #: 0.9 x10 3/mm (ref 0.2–1.0)
NEUTROS ABS: 5.4 10*3/uL (ref 1.4–6.5)
Neutrophil %: 67.9 %
Platelet: 229 10*3/uL (ref 150–440)
RBC: 3.5 10*6/uL — ABNORMAL LOW (ref 4.40–5.90)
RDW: 14.8 % — AB (ref 11.5–14.5)
WBC: 7.9 10*3/uL (ref 3.8–10.6)

## 2014-06-29 LAB — BASIC METABOLIC PANEL
Anion Gap: 7 (ref 7–16)
BUN: 37 mg/dL — ABNORMAL HIGH (ref 7–18)
CHLORIDE: 104 mmol/L (ref 98–107)
Calcium, Total: 8.8 mg/dL (ref 8.5–10.1)
Co2: 27 mmol/L (ref 21–32)
Creatinine: 1.6 mg/dL — ABNORMAL HIGH (ref 0.60–1.30)
EGFR (African American): 53 — ABNORMAL LOW
EGFR (Non-African Amer.): 44 — ABNORMAL LOW
Glucose: 151 mg/dL — ABNORMAL HIGH (ref 65–99)
OSMOLALITY: 287 (ref 275–301)
POTASSIUM: 4.1 mmol/L (ref 3.5–5.1)
Sodium: 138 mmol/L (ref 136–145)

## 2014-06-29 LAB — GLUCOSE, CAPILLARY
GLUCOSE-CAPILLARY: 286 mg/dL — AB (ref 70–99)
Glucose-Capillary: 276 mg/dL — ABNORMAL HIGH (ref 70–99)
Glucose-Capillary: 154 mg/dL — ABNORMAL HIGH (ref 70–99)

## 2014-06-29 MED ORDER — BISACODYL 10 MG RE SUPP: 10.0000 mg | Freq: Every day | RECTAL | Status: DC | PRN

## 2014-06-29 MED ORDER — ACETAMINOPHEN 325 MG PO TABS
325.0000 mg | ORAL_TABLET | ORAL | Status: DC | PRN
Start: 2014-06-29 — End: 2014-07-09
  Administered 2014-07-02: 650 mg via ORAL
  Filled 2014-06-29: qty 2

## 2014-06-29 MED ORDER — INSULIN ASPART 100 UNIT/ML ~~LOC~~ SOLN
0.0000 [IU] | Freq: Three times a day (TID) | SUBCUTANEOUS | Status: DC
Start: 2014-06-29 — End: 2014-06-30
  Administered 2014-06-29: 7 [IU] via SUBCUTANEOUS
  Administered 2014-06-30: 5 [IU] via SUBCUTANEOUS
  Administered 2014-06-30: 7 [IU] via SUBCUTANEOUS

## 2014-06-29 MED ORDER — PANTOPRAZOLE SODIUM 40 MG PO TBEC
40.0000 mg | DELAYED_RELEASE_TABLET | Freq: Every day | ORAL | Status: DC
  Administered 2014-06-30 – 2014-07-09 (×10): 40 mg via ORAL
  Filled 2014-06-29 (×12): qty 1

## 2014-06-29 MED ORDER — PROCHLORPERAZINE MALEATE 5 MG PO TABS
5.0000 mg | ORAL_TABLET | Freq: Four times a day (QID) | ORAL | Status: DC | PRN
  Filled 2014-06-29: qty 2

## 2014-06-29 MED ORDER — LEVALBUTEROL HCL 0.63 MG/3ML IN NEBU
0.6300 mg | INHALATION_SOLUTION | Freq: Four times a day (QID) | RESPIRATORY_TRACT | Status: DC | PRN
  Administered 2014-07-02 (×2): 0.63 mg via RESPIRATORY_TRACT
  Filled 2014-06-29 (×5): qty 3

## 2014-06-29 MED ORDER — INSULIN GLARGINE 100 UNIT/ML ~~LOC~~ SOLN
40.0000 [IU] | Freq: Every day | SUBCUTANEOUS | Status: DC
  Filled 2014-06-29 (×2): qty 0.4

## 2014-06-29 MED ORDER — AMLODIPINE BESYLATE 5 MG PO TABS
5.0000 mg | ORAL_TABLET | Freq: Every day | ORAL | Status: DC
  Administered 2014-06-30 – 2014-07-09 (×10): 5 mg via ORAL
  Filled 2014-06-29 (×11): qty 1

## 2014-06-29 MED ORDER — SENNOSIDES-DOCUSATE SODIUM 8.6-50 MG PO TABS: 1.0000 | ORAL_TABLET | Freq: Every evening | ORAL | Status: DC | PRN

## 2014-06-29 MED ORDER — ALUM & MAG HYDROXIDE-SIMETH 200-200-20 MG/5ML PO SUSP: 30.0000 mL | ORAL | Status: DC | PRN

## 2014-06-29 MED ORDER — ENOXAPARIN SODIUM 30 MG/0.3ML ~~LOC~~ SOLN
30.0000 mg | SUBCUTANEOUS | Status: AC
  Administered 2014-06-29 – 2014-07-03 (×5): 30 mg via SUBCUTANEOUS
  Filled 2014-06-29 (×5): qty 0.3

## 2014-06-29 MED ORDER — FLEET ENEMA 7-19 GM/118ML RE ENEM: 1.0000 | ENEMA | Freq: Once | RECTAL | Status: AC | PRN

## 2014-06-29 MED ORDER — CARVEDILOL 6.25 MG PO TABS
6.2500 mg | ORAL_TABLET | Freq: Two times a day (BID) | ORAL | Status: DC
  Administered 2014-06-29 – 2014-07-09 (×20): 6.25 mg via ORAL
  Filled 2014-06-29 (×22): qty 1

## 2014-06-29 MED ORDER — FUROSEMIDE 20 MG PO TABS
20.0000 mg | ORAL_TABLET | Freq: Every day | ORAL | Status: DC
  Administered 2014-06-30 – 2014-07-09 (×10): 20 mg via ORAL
  Filled 2014-06-29 (×11): qty 1

## 2014-06-29 MED ORDER — PROCHLORPERAZINE 25 MG RE SUPP
12.5000 mg | Freq: Four times a day (QID) | RECTAL | Status: DC | PRN
  Filled 2014-06-29: qty 1

## 2014-06-29 MED ORDER — INSULIN GLARGINE 100 UNIT/ML ~~LOC~~ SOLN
45.0000 [IU] | Freq: Every day | SUBCUTANEOUS | Status: DC
  Administered 2014-06-30: 45 [IU] via SUBCUTANEOUS
  Filled 2014-06-29: qty 0.45

## 2014-06-29 MED ORDER — DIPHENHYDRAMINE HCL 12.5 MG/5ML PO ELIX: 12.5000 mg | ORAL_SOLUTION | Freq: Four times a day (QID) | ORAL | Status: DC | PRN

## 2014-06-29 MED ORDER — POLYSACCHARIDE IRON COMPLEX 150 MG PO CAPS
150.0000 mg | ORAL_CAPSULE | Freq: Two times a day (BID) | ORAL | Status: DC
  Administered 2014-06-29 – 2014-07-09 (×20): 150 mg via ORAL
  Filled 2014-06-29 (×24): qty 1

## 2014-06-29 MED ORDER — ATORVASTATIN CALCIUM 40 MG PO TABS
40.0000 mg | ORAL_TABLET | Freq: Every day | ORAL | Status: DC
  Administered 2014-06-29 – 2014-07-08 (×10): 40 mg via ORAL
  Filled 2014-06-29 (×11): qty 1

## 2014-06-29 MED ORDER — NITROGLYCERIN 2 % TD OINT
0.5000 [in_us] | TOPICAL_OINTMENT | Freq: Two times a day (BID) | TRANSDERMAL | Status: DC
  Administered 2014-06-29 – 2014-07-08 (×19): 0.5 [in_us] via TOPICAL
  Filled 2014-06-29 (×2): qty 30

## 2014-06-29 MED ORDER — B COMPLEX-C PO TABS
1.0000 | ORAL_TABLET | Freq: Every day | ORAL | Status: DC
  Administered 2014-06-30: 1 via ORAL
  Filled 2014-06-29 (×3): qty 1

## 2014-06-29 MED ORDER — TRIAMCINOLONE 0.1 % CREAM:EUCERIN CREAM 1:1
TOPICAL_CREAM | Freq: Three times a day (TID) | CUTANEOUS | Status: DC
  Administered 2014-06-29 – 2014-07-04 (×15): via TOPICAL
  Administered 2014-07-05: 1 via TOPICAL
  Administered 2014-07-05: 15:00:00 via TOPICAL
  Administered 2014-07-06: 1 via TOPICAL
  Administered 2014-07-07: 09:00:00 via TOPICAL
  Filled 2014-06-29 (×2): qty 1

## 2014-06-29 MED ORDER — PROCHLORPERAZINE EDISYLATE 5 MG/ML IJ SOLN
5.0000 mg | Freq: Four times a day (QID) | INTRAMUSCULAR | Status: DC | PRN
  Filled 2014-06-29: qty 2

## 2014-06-29 MED ORDER — ADULT MULTIVITAMIN LIQUID CH
5.0000 mL | Freq: Every day | ORAL | Status: DC
  Administered 2014-06-30: 5 mL via ORAL
  Filled 2014-06-29 (×3): qty 5

## 2014-06-29 MED ORDER — INSULIN ASPART 100 UNIT/ML ~~LOC~~ SOLN
0.0000 [IU] | Freq: Every day | SUBCUTANEOUS | Status: DC
Start: 2014-06-29 — End: 2014-06-29

## 2014-06-29 MED ORDER — INSULIN ASPART 100 UNIT/ML ~~LOC~~ SOLN
0.0000 [IU] | Freq: Three times a day (TID) | SUBCUTANEOUS | Status: DC
  Administered 2014-06-29: 7 [IU] via SUBCUTANEOUS

## 2014-06-29 MED ORDER — APIXABAN 2.5 MG PO TABS
2.5000 mg | ORAL_TABLET | Freq: Two times a day (BID) | ORAL | Status: DC
  Administered 2014-07-04 – 2014-07-09 (×11): 2.5 mg via ORAL
  Filled 2014-06-29 (×13): qty 1

## 2014-06-29 MED ORDER — LOSARTAN POTASSIUM 25 MG PO TABS
25.0000 mg | ORAL_TABLET | Freq: Every day | ORAL | Status: DC
  Administered 2014-06-30 – 2014-07-09 (×10): 25 mg via ORAL
  Filled 2014-06-29 (×11): qty 1

## 2014-06-29 MED ORDER — GUAIFENESIN-DM 100-10 MG/5ML PO SYRP: 5.0000 mL | ORAL_SOLUTION | Freq: Four times a day (QID) | ORAL | Status: DC | PRN

## 2014-06-29 MED ORDER — TRAZODONE HCL 50 MG PO TABS: 25.0000 mg | ORAL_TABLET | Freq: Every evening | ORAL | Status: DC | PRN

## 2014-06-29 NOTE — Progress Notes (Deleted)
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 Date: A & B: 06-10-94 Deduct: $1288 Out of Pocket Max: none Life Max: unlimited CIR: 100% SNF: 100% days 1-20; 80% days 21-100 (100 days max) Outpatient: 80% Co-Pay: 20% Home Health: 100% Co-Pay: none DME: 80% Co-Pay: 20% Providers: pt's preference  SECONDARY: BCBS Supplement Policy#: [id number] Subscriber: self Benefits: Phone #: [phone]   Emergency Contact Information Contact Information    Name Relation Home Work [place] J Spouse [phone]     [name] Son   [phone]      Current Medical History  Patient Admitting Diagnosis: R frontal CVA History of Present Illness: This 81 year old male presented to [facility]) on 06-27-14 with sudden onset of left upper extremity numbness and weakness. Pt has a history of CAD (s/p CABG x 1 in 07/2007), chronic Afib (not on anti-coagulation for the past two months due to anemia, was found to have heme-positive stools, pt was placed on aspirin), second degree AV block, severe aortic valve stenosis s/p bioprosthetic valve replacement, DMII, HTN and history of bladder cancer. MRI confirmed acute right frontal CVA. Cardiology and neurology were consulted. Echocardiogram was performed, which revealed a normal LVEF at 60 to  65%, with impaired relaxation of the LV diastolic filling, mild LVH, moderately enlarged left atrium, with normal functioning bioprosthetic valve. Dopplers were performed, which revealed no flow limiting disease. He was continued on aspirin and recommendation was to put him back on Eliquis at some point in the future. Pt had some wheezing and levalbuterol was given with a good response. The medical team at [facility] recommended inpatient rehab and pt and his family are strongly motivated to maximize his functional independence following this right frontal CVA.  Patient's medical record from [facility] has been reviewed by the rehabilitation admission coordinator and physician.  Past Medical History  Past Medical History  Diagnosis Date  . Aortic stenosis, severe   . Hypertension   . Diabetes mellitus   . OA (osteoarthritis)   . Junctional bradycardia   . BPH (benign prostatic hypertrophy)   . Hyperlipidemia   . Anemia   . Gastrointestinal bleed   . CAD (coronary artery disease)   . Mobitz type 1 second degree atrioventricular block 10/01/2012  . History of bladder cancer     Family History  family history includes Diabetes in his brother; Prostate cancer in his brother; Stroke in his father.  Prior Rehab/Hospitalizations: pt has had prior rehab at [facility] after having a low back disc herniation. He also had home health PT to help with balance issues.  Current Medications See MAR  Patients Current Diet: Low sodium, carb control diet  Precautions / Restrictions Precautions Precautions: Fall Restrictions Weight Bearing Restrictions: No   Prior Activity Level Community (5-7x/wk): Pt and his wife got out everyday on errands and pt was using a single point cane due to general unsteadiness. Pt had a recent fall at [employer]. Pt was driving and used to enjoy making furniture. Pt is  a retired Museum/gallery conservator.    Home Assistive Devices / Equipment Home Assistive Devices/Equipment: Cane (specify quad or straight), Walker (specify type) (single point cane, front wheeled walker and bath equip.)   Prior Functional Level Current Functional Level  Bed Mobility  Independent  Other (CGA)   Transfers  Independent  Min assist   Mobility - Walk/Wheelchair  Independent (pt did use a straight cane)  Min assist (Amb 76' with FWW, unable to grip walker well w/L UE, needed two rest breaks)   Upper Body Dressing  Independent  Min assist (definite impairment with L UE/L grip strength)   Lower Body Dressing  Independent  Mod assist   Grooming  Independent  Min assist   Eating/Drinking  Independent  Min assist   Toilet Transfer  Independent  Min assist   Bladder Continence   WFL  using urinal   Bowel Management  WFL  last BM on 06-28-14   Stair Climbing  Independent  (not assessed, anticipate needs)   Communication  [facility]  [facility]   Memory  [facility]  WFL   Cooking/Meal Prep  Independent between him and his wife     Housework  Independent between him and his wife    Money Management  Independent    Driving  Independent     Special needs/care consideration BiPAP/CPAP no CPM no  Continuous Drip IV no  Dialysis no  Life Vest no  Oxygen no  Special Bed no  Trach Size no  Wound Vac (area) no  Skin - some various forearm bruises, fragile skin on his arms  Bowel mgmt: last BM on 06-28-14 Bladder mgmt: using urinal Diabetic mgmt - yes, managed at home with insulin  Previous Home Environment Living Arrangements: Spouse/significant other Lives With: Spouse Available Help at Discharge: Family Type of Home: House Home Layout: Two level, Bed/bath upstairs Alternate Level Stairs-Rails: Right Alternate Level  Stairs-Number of Steps: flight Home Access: Stairs to enter Technical brewer of Steps: 5  Discharge Living Setting Plans for Discharge Living Setting: Patient's home Type of Home at Discharge: House Discharge Home Layout: Two level Alternate Level Stairs-Rails: Right Alternate Level Stairs-Number of Steps: flight of steps Discharge Home Access: Stairs to enter Entrance Stairs-Number of Steps: 5 Does the patient have any problems obtaining your medications?: No  Social/Family/Support Systems Patient Roles: Spouse Contact Information: wife Stanton Kidney is primary contact as well as Dr. Acie Fredrickson, cardiologist at [facility] Anticipated Caregiver: wife and supportive family Anticipated Caregiver's Contact Information: see above Ability/Limitations of Caregiver: Wife has COPD and will be limited in amount of physical assist she can provide pt. Pt's son and dtr-in-law are supportive. Caregiver Availability: 24/7 Discharge Plan Discussed with Primary Caregiver: Yes (discussed by phone with wife and pt's son Dr. Acie Fredrickson) Is Caregiver In Agreement with Plan?: Yes Does Caregiver/Family have Issues with Lodging/Transportation while Pt is in Rehab?: No  Goals/Additional Needs Patient/Family Goal for Rehab: Supervision and Mod Ind with PT, OT and SLP Expected length of stay: 11-13 days Cultural Considerations: Pt is Presbyterian Dietary Needs: Low fat, low sodium diet, 1800 calorie Equipment Needs: to be determined Pt/Family Agrees to Admission and willing to participate: Yes Program Orientation Provided & Reviewed with Pt/Caregiver Including Roles & Responsibilities: Yes  Patient Condition: I met with this pt at [facility] on 06-28-14 and 06-29-14 to explain the possibility of inpatient rehab. Pt and his family are motivated to come to inpatient rehab following pt's new right frontal CVA. This 81 year old patient was previously  independent using his straight cane and enjoyed getting out of the house each day with  his wife. Pt is currently needing minimal assistance to ambulate 49' with a rolling walker and needed two rest breaks due to fatigue. In addition, pt has significant impairments with his left upper extremity and could not grip the walker well as well as pt is having significant limitations with self care tasks. Pt will benefit from the multi-disciplinary team of skilled PT, OT, SLP and rehab nursing to maximize pt's functional return following this CVA. PT, OT and rehab nursing will focus on increasing strength for greater independence with bed mobility, transfers, gait and self care skills. Further skilled speech therapy will determine if pt has any higher level cognitive needs associated with the right frontal lobe CVA. In addition, rehab nursing will address pt/family education on medication management and bowel/bladder care in preparation for home. Pt will benefit from rehab physician intervention to monitor medical status following his CVA. Discussed case with Dr. Naaman Plummer who feels that pt is a good candidate for our inpatient rehab program. We received medical clearance from Dr. Caryl Comes at [facility]. Pt and his family are motivated to come to inpatient rehab and will benefit from the intensive services of skilled therapy under rehab physician guidance. Pt will be admitted today on 06-29-14.   Preadmission Screen Completed By: Nanetta Batty, PT, 06/29/2014 10:14 AM ______________________________________________________________________  Discussed status with Dr. Naaman Plummer on 06-29-14 at 1011 and received telephone approval for admission today.  Admission Coordinator: Nanetta Batty, PT, time 1011/Date 06-29-14   Assessment/Plan: Diagnosis: right frontal infarct 1. Does the need for close, 24 hr/day Medical supervision in concert with the patient's rehab needs make it unreasonable for this patient to be served in a less intensive setting? Yes 2. Co-Morbidities requiring supervision/potential  complications: AS, ht, dn 3. Due to bladder management, bowel management, safety, skin/wound care, disease management, medication administration, pain management and patient education, does the patient require 24 hr/day rehab nursing? Yes 4. Does the patient require coordinated care of a physician, rehab nurse, PT (1-2 hrs/day, 5 days/week), OT (1-2 hrs/day, 5 days/week) and SLP (1-2 hrs/day, 5 days/week) to address physical and functional deficits in the context of the above medical diagnosis(es)? Yes Addressing deficits in the following areas: balance, endurance, locomotion, strength, transferring, bowel/bladder control, bathing, dressing, feeding, grooming, toileting, cognition, speech and language 5. Can the patient actively participate in an intensive therapy program of at least 3 hrs of therapy 5 days a week? Yes 6. The potential for patient to make measurable gains while on inpatient rehab is excellent 7. Anticipated functional outcomes upon discharge from inpatients are: modified independent and supervision PT, modified independent and supervision OT, modified independent and supervision SLP 8. Estimated rehab length of stay to reach the above functional goals is: 11-13 days 9. Does the patient have adequate social supports to accommodate these discharge functional goals? Yes 10. Anticipated D/C setting: Home 11. Anticipated post D/C treatments: HH therapy and Outpatient therapy 12. Overall Rehab/Functional Prognosis: excellent    RECOMMENDATIONS: This patient's condition is appropriate for continued rehabilitative care in the following setting: CIR Patient has agreed to participate in recommended program. Yes Note that insurance prior authorization may be required for reimbursement for recommended care.  Comment: Admit to inpatient rehab today.   Meredith Staggers, MD, [place] Physical Medicine & Rehabilitation 06/29/2014   Nanetta Batty, PT 06/29/2014

## 2014-06-29 NOTE — PMR Pre-admission (Signed)
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 Date: A & B: 06-10-94 Deduct: $1288 Out of Pocket Max: none Life Max: unlimited CIR: 100% SNF: 100% days 1-20; 80% days 21-100 (100 days max) Outpatient: 80% Co-Pay: 20% Home Health: 100% Co-Pay: none DME: 80% Co-Pay: 20% Providers: pt's preference  SECONDARY: BCBS Supplement Policy#: [id number] Subscriber: self Benefits: Phone #: [phone]   Emergency Contact Information Contact Information    Name Relation Home Work [name] [phone]     [name]. Son   [phone]      Current Medical History  Patient Admitting Diagnosis: R frontal CVA History of Present Illness: This 81 year old male presented to [facility]) on 06-27-14 with sudden onset of left upper extremity numbness and weakness. Pt has a history of CAD (s/p CABG x 1 in 07/2007), chronic Afib (not on anti-coagulation for the past two months due to anemia, was found to have heme-positive stools, pt was placed on aspirin), second degree AV block, severe aortic valve stenosis s/p bioprosthetic valve replacement, DMII, HTN and history of bladder cancer. MRI confirmed acute right frontal CVA. Cardiology and neurology were consulted. Echocardiogram was performed, which revealed a normal LVEF at 60 to 65%, with impaired relaxation of the LV diastolic filling, mild LVH, moderately enlarged left atrium, with normal functioning bioprosthetic valve. Dopplers were performed, which revealed no flow limiting disease. He was continued on aspirin and  recommendation was to put him back on Eliquis at some point in the future. Pt had some wheezing and levalbuterol was given with a good response. The medical team at [facility] recommended inpatient rehab and pt and his family are strongly motivated to maximize his functional independence following this right frontal CVA.  Patient's medical record from [facility] has been reviewed by the rehabilitation admission coordinator and physician.  Past Medical History  Past Medical History  Diagnosis Date  . Aortic stenosis, severe   . Hypertension   . Diabetes mellitus   . OA (osteoarthritis)   . Junctional bradycardia   . BPH (benign prostatic hypertrophy)   . Hyperlipidemia   . Anemia   . Gastrointestinal bleed   . CAD (coronary artery disease)   . Mobitz type 1 second degree atrioventricular block 10/01/2012  . History of bladder cancer     Family History  family history includes Diabetes in his brother; Prostate cancer in his brother; Stroke in his father.  Prior Rehab/Hospitalizations: pt has had prior rehab at [facility] after having a low back disc herniation. He also had home health PT to help with balance issues.  Current Medications See MAR  Patients Current Diet: Low sodium, carb control diet  Precautions / Restrictions Precautions Precautions: Fall Restrictions Weight Bearing Restrictions: No   Prior Activity Level Community (5-7x/wk): Pt and his wife got out everyday on errands and pt was using a single point cane due to general unsteadiness. Pt had a recent fall at [employer]. Pt was driving and used to enjoy making furniture. Pt is a retired Museum/gallery conservator.   Home Assistive Devices / Equipment Home Assistive Devices/Equipment: Cane (specify quad or straight), Walker (specify type) (single point cane, front wheeled walker and bath equip.)   Prior  Functional Level Current Functional Level  Bed Mobility  Independent  Other  (CGA)   Transfers  Independent  Min assist   Mobility - Walk/Wheelchair  Independent (pt did use a straight cane)  Min assist (Amb 42' with FWW, unable to grip walker well w/L UE, needed two rest breaks)   Upper Body Dressing  Independent  Min assist (definite impairment with L UE/L grip strength)   Lower Body Dressing  Independent  Mod assist   Grooming  Independent  Min assist   Eating/Drinking  Independent  Min assist   Toilet Transfer  Independent  Min assist   Bladder Continence   WFL  using urinal   Bowel Management  WFL  last BM on 06-28-14   Stair Climbing   Independent   (not assessed, anticipate needs)   Communication  [facility]  [facility]   Memory  [facility]  WFL   Cooking/Meal Prep  Independent between him and his wife     Housework  Independent between him and his wife    Money Management  Independent    Driving   Independent     Special needs/care consideration BiPAP/CPAP no CPM no  Continuous Drip IV no  Dialysis no  Life Vest no  Oxygen no  Special Bed no  Trach Size no  Wound Vac (area) no  Skin - some various forearm bruises, fragile skin on his arms  Bowel mgmt: last BM on 06-28-14 Bladder mgmt: using urinal Diabetic mgmt - yes, managed at home with insulin  Previous Home Environment Living Arrangements: [name]/significant other Lives With: [name] Available Help at Discharge: Family Type of Home: House Home Layout: Two level, Bed/bath upstairs Alternate Level Stairs-Rails: Right Alternate Level Stairs-Number of Steps: flight Home Access: Stairs to enter Technical brewer of Steps: 5  Discharge Living Setting Plans for Discharge Living Setting: Patient's home Type of Home at Discharge: House Discharge Home Layout: Two level Alternate Level Stairs-Rails: Right Alternate Level Stairs-Number of Steps: flight of  steps Discharge Home Access: Stairs to enter Entrance Stairs-Number of Steps: 5 Does the patient have any problems obtaining your medications?: No  Social/Family/Support Systems Patient Roles: [name] Contact Information: wife [name] is primary contact as well as [name], cardiologist at [facility] Anticipated Caregiver: wife and supportive family Anticipated Caregiver's Contact Information: see above Ability/Limitations of Caregiver: Wife has COPD and will be limited in amount of physical assist she can provide pt. Pt's son and dtr-in-law are supportive. Caregiver Availability: 24/7 Discharge Plan Discussed with Primary Caregiver: Yes (discussed by phone with wife and pt's son [name]) Is Caregiver In Agreement with Plan?: Yes Does Caregiver/Family have Issues with Lodging/Transportation while Pt is in Rehab?: No  Goals/Additional Needs Patient/Family Goal for Rehab: Supervision and Mod Ind with PT, OT and SLP Expected length of stay: 11-13 days Cultural Considerations: Pt is Presbyterian Dietary Needs: Low fat, low sodium diet, 1800 calorie Equipment Needs: to be determined Pt/Family Agrees to Admission and willing to participate: Yes Program Orientation Provided & Reviewed with Pt/Caregiver Including Roles & Responsibilities: Yes  Patient Condition: I met with this pt at [facility] on 06-28-14 and 06-29-14 to explain the possibility of inpatient rehab. Pt and his family are motivated to come to inpatient rehab following pt's new right frontal CVA. This 81 year old patient was previously independent using his straight cane and enjoyed getting out of the house each day with his wife. Pt is currently needing minimal assistance to ambulate 81' with a rolling walker and needed two  rest breaks due to fatigue. In addition, pt has significant impairments with his left upper extremity and could not grip the walker well as well as pt is having significant limitations with self care tasks. Pt will benefit from  the multi-disciplinary team of skilled PT, OT, SLP and rehab nursing to maximize pt's functional return following this CVA. PT, OT and rehab nursing will focus on increasing strength for greater independence with bed mobility, transfers, gait and self care skills. Further skilled speech therapy will determine if pt has any higher level cognitive needs associated with the right frontal lobe CVA. In addition, rehab nursing will address pt/family education on medication management and bowel/bladder care in preparation for home. Pt will benefit from rehab physician intervention to monitor medical status following his CVA. Discussed case with Dr. Naaman Plummer who feels that pt is a good candidate for our inpatient rehab program. We received medical clearance from Dr. Caryl Comes at [facility]. Pt and his family are motivated to come to inpatient rehab and will benefit from the intensive services of skilled therapy under rehab physician guidance. Pt will be admitted today on 06-29-14.   Preadmission Screen Completed By: Nanetta Batty, PT, 06/29/2014 10:14 AM ______________________________________________________________________  Discussed status with Dr. Naaman Plummer on 06-29-14 at 1011 and received telephone approval for admission today.  Admission Coordinator: Nanetta Batty, PT, time 1011/Date 06-29-14   Assessment/Plan: Diagnosis: right frontal infarct 1. Does the need for close, 24 hr/day Medical supervision in concert with the patient's rehab needs make it unreasonable for this patient to be served in a less intensive setting? Yes 2. Co-Morbidities requiring supervision/potential complications: AS, ht, dn 3. Due to bladder management, bowel management, safety, skin/wound care, disease management, medication administration, pain management and patient education, does the patient require 24 hr/day rehab nursing? Yes 4. Does the patient require coordinated care of a physician, rehab nurse, PT (1-2 hrs/day, 5 days/week),  OT (1-2 hrs/day, 5 days/week) and SLP (1-2 hrs/day, 5 days/week) to address physical and functional deficits in the context of the above medical diagnosis(es)? Yes Addressing deficits in the following areas: balance, endurance, locomotion, strength, transferring, bowel/bladder control, bathing, dressing, feeding, grooming, toileting, cognition, speech and language 5. Can the patient actively participate in an intensive therapy program of at least 3 hrs of therapy 5 days a week? Yes 6. The potential for patient to make measurable gains while on inpatient rehab is excellent 7. Anticipated functional outcomes upon discharge from inpatients are: modified independent and supervision PT, modified independent and supervision OT, modified independent and supervision SLP 8. Estimated rehab length of stay to reach the above functional goals is: 11-13 days 9. Does the patient have adequate social supports to accommodate these discharge functional goals? Yes 10. Anticipated D/C setting: Home 11. Anticipated post D/C treatments: HH therapy and Outpatient therapy 12. Overall Rehab/Functional Prognosis: excellent    RECOMMENDATIONS: This patient's condition is appropriate for continued rehabilitative care in the following setting: CIR Patient has agreed to participate in recommended program. Yes Note that insurance prior authorization may be required for reimbursement for recommended care.  Comment: Admit to inpatient rehab today.   Meredith Staggers, MD, [place] Physical Medicine & Rehabilitation 06/29/2014   Nanetta Batty, PT 06/29/2014

## 2014-06-29 NOTE — Progress Notes (Signed)
Received patient from [facility].  Patient oriented to room and unit.  Will continue to monitor.

## 2014-06-29 NOTE — Progress Notes (Signed)
Patient adamant on using the sliding scare for insulin that he uses at home.  Algis Liming, PA notified and agrees to use the patient's scale.  Will continue to monitor.
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
## 2014-06-29 NOTE — PMR Pre-admission (Deleted)
[facility] PMR Admission Coordinator Pre-Admission Assessment  Patient: [name] is an 81 y.o., male MRN: [id number] DOB: 08/01/1927 Height: 5' 10" (177.8 cm) Weight: 105.235 kg (232 lb)  Insurance Information  PRIMARY: Medicare A & B Policy#: [id number] Subscriber: self Pre-Cert#: verified in [place] Employer: retired [employer] Date: A & B: 06-10-94 Deduct: $1288 Out of Pocket Max: none Life Max: unlimited CIR: 100% SNF: 100% days 1-20; 80% days 21-100 (100 days max) Outpatient: 80% Co-Pay: 20% Home Health: 100% Co-Pay: none DME: 80% Co-Pay: 20% Providers: pt's preference  SECONDARY: BCBS Supplement Policy#: [id number] Subscriber: self Benefits: Phone #: [phone]   Emergency Contact Information Contact Information    Name Relation Home Work Mobile   Bartell,Mary J Spouse [phone]     Starzyk,Tyqwan Jr. Son   [phone]      Current Medical History  Patient Admitting Diagnosis: R frontal CVA History of Present Illness: This 81 year old male presented to [facility] ([facility] on 06-27-14 with sudden onset of left upper extremity numbness and weakness. Pt has a history of CAD (s/p CABG x 1 in 07/2007), chronic Afib (not on anti-coagulation for the past two months due to anemia, was found to have heme-positive stools, pt was placed on aspirin), second degree AV block, severe aortic valve stenosis s/p bioprosthetic valve replacement, DMII, HTN and history of bladder cancer. MRI confirmed acute right frontal CVA. Cardiology and neurology were consulted. Echocardiogram was performed, which revealed a normal LVEF at 60 to 65%, with impaired relaxation of the LV diastolic filling, mild LVH, moderately enlarged left atrium, with normal functioning bioprosthetic valve. Dopplers were performed, which revealed no flow limiting disease. He was continued on aspirin and  recommendation was to put him back on Eliquis at some point in the future. Pt had some wheezing and levalbuterol was given with a good response. The medical team at [facility] recommended inpatient rehab and pt and his family are strongly motivated to maximize his functional independence following this right frontal CVA.  Patient's medical record from [facility] has been reviewed by the rehabilitation admission coordinator and physician.  Past Medical History  Past Medical History  Diagnosis Date  . Aortic stenosis, severe   . Hypertension   . Diabetes mellitus   . OA (osteoarthritis)   . Junctional bradycardia   . BPH (benign prostatic hypertrophy)   . Hyperlipidemia   . Anemia   . Gastrointestinal bleed   . CAD (coronary artery disease)   . Mobitz type 1 second degree atrioventricular block 10/01/2012  . History of bladder cancer     Family History  family history includes Diabetes in his brother; Prostate cancer in his brother; Stroke in his father.  Prior Rehab/Hospitalizations: pt has had prior rehab at SNF after having a low back disc herniation. He also had home health PT to help with balance issues.  Current Medications See MAR  Patients Current Diet: Low sodium, carb control diet  Precautions / Restrictions Precautions Precautions: Fall Restrictions Weight Bearing Restrictions: No   Prior Activity Level Community (5-7x/wk): Pt and his wife got out everyday on errands and pt was using a single point cane due to general unsteadiness. Pt had a recent fall at [employer]. Pt was driving and used to enjoy making furniture. 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   Home Assistive Devices / Equipment Home Assistive Devices/Equipment: Cane (specify quad or straight), Walker (specify type) (single point cane, front wheeled walker and bath equip.)   Prior   pt was using a single point cane due to general unsteadiness. Pt had a recent fall at [employer]. Pt was driving and used to enjoy making furniture. Pt is a retired Museum/gallery conservator.   Home Assistive Devices / Equipment Home Assistive Devices/Equipment: Cane (specify quad or straight), Walker (specify type) (single point cane, front wheeled walker and bath equip.)   Prior Functional Level Current Functional Level  Bed Mobility  Independent  Other (CGA)   Transfers  Independent  Min assist   Mobility - Walk/Wheelchair   Independent (pt did use a straight cane)  Min assist (Amb 14' with FWW, unable to grip walker well w/L UE, needed two rest breaks)   Upper Body Dressing  Independent  Min assist (definite impairment with L UE/L grip strength)   Lower Body Dressing  Independent  Mod assist   Grooming  Independent  Min assist   Eating/Drinking  Independent  Min assist   Toilet Transfer  Independent  Min assist   Bladder Continence   WFL  using urinal   Bowel Management  WFL  last BM on 06-28-14   Stair Climbing   Independent   (not assessed, anticipate needs)   Communication  [facility]  [facility]   Memory  [facility]  WFL   Cooking/Meal Prep  Independent between him and his wife      Housework  Independent between him and his wife    Money Management  Independent    Driving   Independent     Special needs/care consideration BiPAP/CPAP no CPM no  Continuous Drip IV no  Dialysis no          Life Vest no  Oxygen no  Special Bed no  Trach Size no  Wound Vac (area) no       Skin - some various forearm bruises, fragile skin on his arms                               Bowel mgmt: last BM on 06-28-14 Bladder mgmt: using urinal Diabetic mgmt - yes, managed at home with insulin  Previous Home Environment Living Arrangements: Spouse/significant other  Lives With: Spouse Available Help at Discharge: Family Type of Home: House Home Layout: Two level, Bed/bath upstairs Alternate Level Stairs-Rails: Right Alternate Level Stairs-Number of Steps: flight Home Access: Stairs to enter Technical brewer of Steps: 5  Discharge Living Setting Plans for Discharge Living Setting: Patient's home Type of Home at Discharge: House Discharge Home Layout: Two level Alternate Level Stairs-Rails: Right Alternate Level Stairs-Number of Steps: flight of steps Discharge Home Access: Stairs to enter Entrance Stairs-Number of Steps: 5 Does the patient have any problems obtaining your medications?:  No  Social/Family/Support Systems Patient Roles: Spouse Contact Information: wife Stanton Kidney is primary contact as well as Dr. Acie Fredrickson, cardiologist at [facility] Anticipated Caregiver: wife and supportive family Anticipated Caregiver's Contact Information: see above Ability/Limitations of Caregiver: Wife has COPD and will be limited in amount of physical assist she can provide pt. Pt's son and dtr-in-law are supportive. Caregiver Availability: 24/7 Discharge Plan Discussed with Primary Caregiver: Yes (discussed by phone with wife and pt's son Dr. Acie Fredrickson) Is Caregiver In Agreement with Plan?: Yes Does Caregiver/Family have Issues with Lodging/Transportation while Pt is in Rehab?: No  Goals/Additional Needs Patient/Family Goal for Rehab: Supervision and Mod Ind with PT, OT and SLP Expected length of stay: 11-13 days Cultural Considerations: Pt is Pres[employer] Dietary Needs: Low  fat, low sodium diet, 1800 calorie Equipment Needs: to be determined Pt/Family Agrees to Admission and willing to participate: Yes Program Orientation Provided & Reviewed with Pt/Caregiver Including Roles  & Responsibilities: Yes  Patient Condition: I met with this pt at [facility] on 06-28-14 and 06-29-14 to explain the possibility of inpatient rehab. Pt and his family are motivated to come to inpatient rehab following pt's new right frontal CVA. This 81 year old patient was previously independent using his straight cane and enjoyed getting out of the house each day with his wife. Pt is currently needing minimal assistance to ambulate 79' with a rolling walker and needed two rest breaks due to fatigue. In addition, pt has significant impairments with his left upper extremity and could not grip the walker well as well as pt is having significant limitations with self care tasks. Pt will benefit from the multi-disciplinary team of skilled PT, OT, SLP and rehab nursing to maximize pt's functional return following this CVA. PT, OT and rehab  nursing will focus on increasing strength for greater independence with bed mobility, transfers, gait and self care skills. Further skilled speech therapy will determine if pt has any higher level cognitive needs associated with the right frontal lobe CVA. In addition, rehab nursing will address pt/family education on medication management and bowel/bladder care in preparation for home. Pt will benefit from rehab physician intervention to monitor medical status following his CVA. Discussed case with Dr. Naaman Plummer who feels that pt is a good candidate for our inpatient rehab program. We received medical clearance from Dr. Caryl Comes at [facility]. Pt and his family are motivated to come to inpatient rehab and will benefit from the intensive services of skilled therapy under rehab physician guidance. Pt will be admitted today on 06-29-14.   Preadmission Screen Completed By:  Nanetta Batty, PT, 06/29/2014 10:14 AM ______________________________________________________________________   Discussed status with Dr. Naaman Plummer on 06-29-14 at 1011 and received telephone approval for admission today.  Admission Coordinator:  Nanetta Batty, PT, time 1011/Date 06-29-14   Assessment/Plan: Diagnosis: right frontal infarct 1. Does the need for close, 24 hr/day  Medical supervision in concert with the patient's rehab needs make it unreasonable for this patient to be served in a less intensive setting? Yes 2. Co-Morbidities requiring supervision/potential complications: AS, ht, dn 3. Due to bladder management, bowel management, safety, skin/wound care, disease management, medication administration, pain management and patient education, does the patient require 24 hr/day rehab nursing? Yes 4. Does the patient require coordinated care of a physician, rehab nurse, PT (1-2 hrs/day, 5 days/week), OT (1-2 hrs/day, 5 days/week) and SLP (1-2 hrs/day, 5 days/week) to address physical and functional deficits in the context of the above  medical diagnosis(es)? Yes Addressing deficits in the following areas: balance, endurance, locomotion, strength, transferring, bowel/bladder control, bathing, dressing, feeding, grooming, toileting, cognition, speech and language 5. Can the patient actively participate in an intensive therapy program of at least 3 hrs of therapy 5 days a week? Yes 6. The potential for patient to make measurable gains while on inpatient rehab is excellent 7. Anticipated functional outcomes upon discharge from inpatients are: modified independent and supervision PT, modified independent and supervision OT, modified independent and supervision SLP 8. Estimated rehab length of stay to reach the above functional goals is: 11-13 days 9. Does the patient have adequate social supports to accommodate these discharge functional goals? Yes 10. Anticipated D/C setting: Home 11. Anticipated post D/C treatments: HH therapy and Outpatient therapy 12. Overall Rehab/Functional Prognosis: excellent  RECOMMENDATIONS: This patient's condition is appropriate for continued rehabilitative care in the following setting: CIR Patient has agreed to participate in recommended program. Yes Note that insurance prior authorization may be required for reimbursement for recommended care.  Comment:  Admit to inpatient rehab today.   Meredith Staggers, MD, [facility] 06/29/2014   Nanetta Batty, PT 06/29/2014

## 2014-06-29 NOTE — Progress Notes (Signed)
Patient information reviewed and entered into eRehab system by Aarohi Redditt, RN, CRRN, PPS Coordinator.  Information including medical coding and functional independence measure will be reviewed and updated through discharge.     Per nursing patient was given "Data Collection Information Summary for Patients in Inpatient Rehabilitation Facilities with attached "Privacy Act Statement-Health Care Records" upon admission.  

## 2014-06-29 NOTE — Progress Notes (Addendum)
Eliquis per Pharmacy for afib.  6 YOM with hx of bioprosthetic AVR and afib, admitted to [facility] on 1/18 with L arm numbness and weakness. 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 Pt. transferred to CIR on 1/20.   - BMET and CBC pending for AM - Pt. Is currently on lovneox 30 mg daily for VTE prophylaxis  Plan: Entered Eliquis 2.5mg  BID start 1/25 Entered Lovenox stop date 1/24 at 2359 Monitor renal function and CBC Provide Eliquis education when medication is started.  Maryanna Shape, PharmD, BCPS  Clinical Pharmacist  Pager: [phone]

## 2014-06-29 NOTE — Discharge Instructions (Addendum)
Inpatient Rehab Discharge Instructions  [name] Discharge date and time:  07/09/14  Activities/Precautions/ Functional Status: Activity: activity as tolerated No Driving Diet: diabetic diet/ Heart Healthy Wound Care: Apply antibiotic ointment and dry dressing to callus on right toe. Follow up with podiatry for treatment on area.   Functional status:  ___ No restrictions     ___ Walk up steps independently ___ 24/7 supervision/assistance   ___ Walk up steps with assistance ___ Intermittent supervision/assistance  _X__ Bathe/dress independently _X__ Walk with walker    ___ Bathe/dress with assistance _X__ Walk Independently    ___ Shower independently ___ Walk with assistance    ___ Shower with assistance _X__ No alcohol     ___ Return to work/school ________   Special Instructions: 1. Nurse to draw CBC/blood count on 02/2 with results to Dr. Derrel Nip.  2. Check blood sugar before meals and at bedtime.    COMMUNITY REFERRALS UPON DISCHARGE:    Home Health:   PT, OT, [place] WFUXN:[phone] Date of last service:07/09/2014  Medical Equipment/Items Ordered: HAS NEEDED EQUIPMENT FROM PREVIOUS ADMITS     GENERAL COMMUNITY RESOURCES FOR PATIENT/FAMILY: Support Groups:[facility] SUPPORT GROUP- NEXT MEETING 3/24 AT [facility] DISCHARGE INSTRUCTIONS SMOKING Cigarette smoking nearly doubles your risk of having a stroke & is the single most alterable risk factor  If you smoke or have smoked in the last 12 months, you are advised to quit smoking for your health.  Most of the excess cardiovascular risk related to smoking disappears within a year of stopping.  Ask you doctor about anti-smoking medications  [place] Quit Line: 1-800-QUIT NOW  Free Smoking Cessation Classes [phone]  CHOLESTEROL Know your levels; limit fat & cholesterol in your diet  Lipid Panel  No results found for: CHOL, TRIG, HDL, CHOLHDL,  VLDL, LDLCALC    Many patients benefit from treatment even if their cholesterol is at goal.  Goal: Total Cholesterol (CHOL) less than 160  Goal:  Triglycerides (TRIG) less than 150  Goal:  HDL greater than 40  Goal:  LDL (LDLCALC) less than 100   BLOOD PRESSURE American Stroke Association blood pressure target is less that 120/80 mm/Hg  Your discharge blood pressure is:  BP: (!) 127/47 mmHg  Monitor your blood pressure  Limit your salt and alcohol intake  Many individuals will require more than one medication for high blood pressure  DIABETES (A1c is a blood sugar average for last 3 months) Goal HGBA1c is under 7% (HBGA1c is blood sugar average for last 3 months)  Diabetes:     Lab Results  Component Value Date   HGBA1C * 07/27/2007    7.8 (NOTE)   The ADA recommends the following therapeutic goals for glycemic   control related to Hgb A1C measurement:   Goal of Therapy:   < 7.0% Hgb A1C   Action Suggested:  > 8.0% Hgb A1C   Ref:  Diabetes Care, 22, Suppl. 1, 1999     Your HGBA1c can be lowered with medications, healthy diet, and exercise.  Check your blood sugar as directed by your physician  Call your physician if you experience unexplained or low blood sugars.  PHYSICAL ACTIVITY/REHABILITATION Goal is 30 minutes at least 4 days per week  Activity: No driving, Therapies: See above Return to work: N/A  Activity decreases your risk of heart attack and stroke and makes your heart stronger.  It helps control  your weight and blood pressure; helps you relax and can improve your mood.  Participate in a regular exercise program.  Talk with your doctor about the best form of exercise for you (dancing, walking, swimming, cycling).  DIET/WEIGHT Goal is to maintain a healthy weight  Your discharge diet is: Diet heart healthy/carb modified thin liquids Your height is:  Height: 5\' 10"  (177.8 cm) Your current weight is: Weight: 105.4 kg (232 lb 5.8 oz) Your Body Mass Index (BMI)  is:  BMI (Calculated): 34.1  Following the type of diet specifically designed for you will help prevent another stroke.  Your goal weight  is:    Your goal Body Mass Index (BMI) is 19-24.  Healthy food habits can help reduce 3 risk factors for stroke:  High cholesterol, hypertension, and excess weight.  RESOURCES Stroke/Support Group:  Call [phone]   STROKE EDUCATION PROVIDED/REVIEWED AND GIVEN TO PATIENT Stroke warning signs and symptoms How to activate emergency medical system (call 911). Medications prescribed at discharge. Need for follow-up after discharge. Personal risk factors for stroke. Pneumonia vaccine given:  Flu vaccine given:  My questions have been answered, the writing is legible, and I understand these instructions.  I will adhere to these goals & educational materials that have been provided to me after my discharge from the hospital.       My questions have been answered and I understand these instructions. I will adhere to these goals and the provided educational materials after my discharge from the hospital.  Patient/Caregiver Signature _______________________________ Date __________  Clinician Signature _______________________________________ Date __________  Please bring this form and your medication list with you to all your follow-up doctor's appointments.    Information on my medicine - ELIQUIS (apixaban)  This medication education was reviewed with me or my healthcare representative as part of my discharge preparation.    Why was Eliquis prescribed for you? Eliquis was prescribed for you to reduce the risk of a blood clot forming that can cause a stroke if you have a medical condition called atrial fibrillation (a type of irregular heartbeat).  What do You need to know about Eliquis ? Take your Eliquis 2.5 mg TWICE DAILY - one tablet in the morning and one tablet in the evening with or without food. If you have difficulty swallowing the  tablet whole please discuss with your pharmacist how to take the medication safely.  Take Eliquis exactly as prescribed by your doctor and DO NOT stop taking Eliquis without talking to the doctor who prescribed the medication.  Stopping may increase your risk of developing a stroke.  Refill your prescription before you run out.  After discharge, you should have regular check-up appointments with your healthcare provider that is prescribing your Eliquis.  In the future your dose may need to be changed if your kidney function or weight changes by a significant amount or as you get older.  What do you do if you miss a dose? If you miss a dose, take it as soon as you remember on the same day and resume taking twice daily.  Do not take more than one dose of ELIQUIS at the same time to make up a missed dose.  Important Safety Information A possible side effect of Eliquis is bleeding. You should call your healthcare provider right away if you experience any of the following: ? Bleeding from an injury or your nose that does not stop. ? Unusual colored urine (red or dark brown) or unusual colored stools (  red or black). ? Unusual bruising for unknown reasons. ? A serious fall or if you hit your head (even if there is no bleeding).  Some medicines may interact with Eliquis and might increase your risk of bleeding or clotting while on Eliquis. To help avoid this, consult your healthcare provider or pharmacist prior to using any new prescription or non-prescription medications, including herbals, vitamins, non-steroidal anti-inflammatory drugs (NSAIDs) and supplements.  This website has more information on Eliquis (apixaban): http://[email or web address].com/eliquis/home

## 2014-06-30 ENCOUNTER — Inpatient Hospital Stay (HOSPITAL_COMMUNITY): Payer: Medicare Other | Admitting: Rehabilitation

## 2014-06-30 ENCOUNTER — Inpatient Hospital Stay (HOSPITAL_COMMUNITY): Payer: Medicare Other | Admitting: Speech Pathology

## 2014-06-30 ENCOUNTER — Encounter (HOSPITAL_COMMUNITY): Payer: Self-pay | Admitting: Physical Medicine and Rehabilitation

## 2014-06-30 ENCOUNTER — Inpatient Hospital Stay (HOSPITAL_COMMUNITY): Payer: Medicare Other | Admitting: Occupational Therapy

## 2014-06-30 DIAGNOSIS — I482 Chronic atrial fibrillation: Secondary | ICD-10-CM

## 2014-06-30 DIAGNOSIS — Z954 Presence of other heart-valve replacement: Secondary | ICD-10-CM

## 2014-06-30 DIAGNOSIS — E119 Type 2 diabetes mellitus without complications: Secondary | ICD-10-CM

## 2014-06-30 DIAGNOSIS — K922 Gastrointestinal hemorrhage, unspecified: Secondary | ICD-10-CM

## 2014-06-30 LAB — GLUCOSE, CAPILLARY
GLUCOSE-CAPILLARY: 156 mg/dL — AB (ref 70–99)
GLUCOSE-CAPILLARY: 239 mg/dL — AB (ref 70–99)
GLUCOSE-CAPILLARY: 297 mg/dL — AB (ref 70–99)
GLUCOSE-CAPILLARY: 210 mg/dL — AB (ref 70–99)
Glucose-Capillary: 201 mg/dL — ABNORMAL HIGH (ref 70–99)

## 2014-06-30 LAB — CBC WITH DIFFERENTIAL/PLATELET
Basophils Absolute: 0 10*3/uL (ref 0.0–0.1)
Basophils Relative: 1 % (ref 0–1)
EOS ABS: 0.5 10*3/uL (ref 0.0–0.7)
Eosinophils Relative: 7 % — ABNORMAL HIGH (ref 0–5)
HEMATOCRIT: 29.2 % — AB (ref 39.0–52.0)
HEMOGLOBIN: 9.1 g/dL — AB (ref 13.0–17.0)
LYMPHS ABS: 1.2 10*3/uL (ref 0.7–4.0)
Lymphocytes Relative: 18 % (ref 12–46)
MCH: 26.3 pg (ref 26.0–34.0)
MCHC: 31.2 g/dL (ref 30.0–36.0)
MCV: 84.4 fL (ref 78.0–100.0)
MONOS PCT: 10 % (ref 3–12)
Monocytes Absolute: 0.6 10*3/uL (ref 0.1–1.0)
Neutro Abs: 4.3 10*3/uL (ref 1.7–7.7)
Neutrophils Relative %: 64 % (ref 43–77)
Platelets: 244 10*3/uL (ref 150–400)
RBC: 3.46 MIL/uL — ABNORMAL LOW (ref 4.22–5.81)
RDW: 14.2 % (ref 11.5–15.5)
WBC: 6.6 10*3/uL (ref 4.0–10.5)

## 2014-06-30 LAB — COMPREHENSIVE METABOLIC PANEL
ALBUMIN: 3 g/dL — AB (ref 3.5–5.2)
ALK PHOS: 85 U/L (ref 39–117)
ALT: 15 U/L (ref 0–53)
AST: 28 U/L (ref 0–37)
Anion gap: 9 (ref 5–15)
BUN: 40 mg/dL — AB (ref 6–23)
CHLORIDE: 100 meq/L (ref 96–112)
CO2: 27 mmol/L (ref 19–32)
CREATININE: 1.65 mg/dL — AB (ref 0.50–1.35)
Calcium: 8.8 mg/dL (ref 8.4–10.5)
GFR, EST AFRICAN AMERICAN: 42 mL/min — AB (ref >90)
GFR, EST NON AFRICAN AMERICAN: 37 mL/min — AB (ref >90)
Glucose, Bld: 245 mg/dL — ABNORMAL HIGH (ref 70–99)
Potassium: 4.3 mmol/L (ref 3.5–5.1)
Sodium: 136 mmol/L (ref 135–145)
TOTAL PROTEIN: 6.1 g/dL (ref 6.0–8.3)
Total Bilirubin: 0.8 mg/dL (ref 0.3–1.2)

## 2014-06-30 MED ORDER — OCUVITE-LUTEIN PO CAPS: 1.0000 | ORAL_CAPSULE | Freq: Two times a day (BID) | ORAL | Status: DC

## 2014-06-30 MED ORDER — INSULIN ASPART 100 UNIT/ML ~~LOC~~ SOLN
0.0000 [IU] | Freq: Three times a day (TID) | SUBCUTANEOUS | Status: DC
  Administered 2014-06-30 – 2014-07-01 (×4): 5 [IU] via SUBCUTANEOUS
  Administered 2014-07-01: 7 [IU] via SUBCUTANEOUS

## 2014-06-30 MED ORDER — INSULIN GLARGINE 100 UNIT/ML ~~LOC~~ SOLN
5.0000 [IU] | Freq: Once | SUBCUTANEOUS | Status: AC
  Administered 2014-06-30: 5 [IU] via SUBCUTANEOUS
  Filled 2014-06-30: qty 0.05

## 2014-06-30 MED ORDER — INSULIN GLARGINE 100 UNIT/ML ~~LOC~~ SOLN
50.0000 [IU] | Freq: Every day | SUBCUTANEOUS | Status: DC
  Administered 2014-07-01 – 2014-07-09 (×9): 50 [IU] via SUBCUTANEOUS
  Filled 2014-06-30 (×9): qty 0.5

## 2014-06-30 MED ORDER — ADULT MULTIVITAMIN W/MINERALS CH
1.0000 | ORAL_TABLET | Freq: Every day | ORAL | Status: DC
  Administered 2014-07-01 – 2014-07-09 (×11): 1 via ORAL
  Filled 2014-06-30 (×11): qty 1

## 2014-06-30 MED ORDER — VITAMIN C 500 MG PO TABS
500.0000 mg | ORAL_TABLET | Freq: Every day | ORAL | Status: DC
  Administered 2014-07-01 – 2014-07-09 (×9): 500 mg via ORAL
  Filled 2014-06-30 (×10): qty 1

## 2014-06-30 MED ORDER — OCUVITE-LUTEIN PO CAPS
1.0000 | ORAL_CAPSULE | Freq: Two times a day (BID) | ORAL | Status: DC
  Administered 2014-06-30 – 2014-07-09 (×18): 1 via ORAL
  Filled 2014-06-30 (×25): qty 1

## 2014-06-30 MED ORDER — TAMSULOSIN HCL 0.4 MG PO CAPS
0.4000 mg | ORAL_CAPSULE | Freq: Every day | ORAL | Status: DC
  Administered 2014-06-30 – 2014-07-08 (×9): 0.4 mg via ORAL
  Filled 2014-06-30 (×10): qty 1

## 2014-06-30 NOTE — Progress Notes (Signed)
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 Date: A & B: 06-10-94 Deduct: $1288 Out of Pocket Max: none Life Max: unlimited CIR: 100% SNF: 100% days 1-20; 80% days 21-100 (100 days max) Outpatient: 80% Co-Pay: 20% Home Health: 100% Co-Pay: none DME: 80% Co-Pay: 20% Providers: pt's preference  SECONDARY: BCBS Supplement Policy#: [id number] Subscriber: self Benefits: Phone #: [phone]   Emergency Contact Information Contact Information    Name Relation Home Work [name] Spouse [phone]     [name]. Son   [phone]      Current Medical History  Patient Admitting Diagnosis: R frontal CVA History of Present Illness: This 81 year old male presented to [facility] on 06-27-14 with sudden onset of left upper extremity numbness and weakness. Pt has a history of CAD (s/p CABG x 1 in 07/2007), chronic Afib (not on anti-coagulation for the past two months due to anemia, was found to have heme-positive stools, pt was placed on aspirin), second degree AV block, severe aortic valve stenosis s/p bioprosthetic valve replacement, DMII, HTN and history of bladder cancer. MRI confirmed acute right frontal CVA. Cardiology and neurology were consulted. Echocardiogram was performed, which revealed a normal LVEF at 60 to  65%, with impaired relaxation of the LV diastolic filling, mild LVH, moderately enlarged left atrium, with normal functioning bioprosthetic valve. Dopplers were performed, which revealed no flow limiting disease. He was continued on aspirin and recommendation was to put him back on Eliquis at some point in the future. Pt had some wheezing and levalbuterol was given with a good response. The medical team at [facility] recommended inpatient rehab and pt and his family are strongly motivated to maximize his functional independence following this right frontal CVA.  Patient's medical record from [facility] has been reviewed by the rehabilitation admission coordinator and physician.  Past Medical History  Past Medical History  Diagnosis Date  . Aortic stenosis, severe   . Hypertension   . Diabetes mellitus   . OA (osteoarthritis)   . Junctional bradycardia   . BPH (benign prostatic hypertrophy)   . Hyperlipidemia   . Anemia   . Gastrointestinal bleed   . CAD (coronary artery disease)   . Mobitz type 1 second degree atrioventricular block 10/01/2012  . History of bladder cancer     Family History  family history includes Diabetes in his brother; Prostate cancer in his brother; Stroke in his father.  Prior Rehab/Hospitalizations: pt has had prior rehab at [facility] after having a low back disc herniation. He also had home health PT to help with balance issues.  Current Medications See MAR  Patients Current Diet: Low sodium, carb control diet  Precautions / Restrictions Precautions Precautions: Fall Restrictions Weight Bearing Restrictions: No   Prior Activity Level Community (5-7x/wk): Pt and his wife got out everyday on errands and pt was using a single point cane due to general unsteadiness. Pt had a recent fall at [employer]. Pt was driving and used to enjoy making furniture. Pt is  a retired Museum/gallery conservator.    Home Assistive Devices / Equipment Home Assistive Devices/Equipment: Cane (specify quad or straight), Walker (specify type) (single point cane, front wheeled walker and bath equip.)   Prior Functional Level Current Functional Level  Bed Mobility  Independent  Other (CGA)   Transfers  Independent  Min assist   Mobility - Walk/Wheelchair  Independent (pt did use a straight cane)  Min assist (Amb 76' with FWW, unable to grip walker well w/L UE, needed two rest breaks)   Upper Body Dressing  Independent  Min assist (definite impairment with L UE/L grip strength)   Lower Body Dressing  Independent  Mod assist   Grooming  Independent  Min assist   Eating/Drinking  Independent  Min assist   Toilet Transfer  Independent  Min assist   Bladder Continence   WFL  using urinal   Bowel Management  WFL  last BM on 06-28-14   Stair Climbing  Independent  (not assessed, anticipate needs)   Communication  [facility]  [facility]   Memory  [facility]  WFL   Cooking/Meal Prep  Independent between him and his wife     Housework  Independent between him and his wife    Money Management  Independent    Driving  Independent     Special needs/care consideration BiPAP/CPAP no CPM no  Continuous Drip IV no  Dialysis no  Life Vest no  Oxygen no  Special Bed no  Trach Size no  Wound Vac (area) no  Skin - some various forearm bruises, fragile skin on his arms  Bowel mgmt: last BM on 06-28-14 Bladder mgmt: using urinal Diabetic mgmt - yes, managed at home with insulin  Previous Home Environment Living Arrangements: Spouse/significant other Lives With: Spouse Available Help at Discharge: Family Type of Home: House Home Layout: Two level, Bed/bath upstairs Alternate Level Stairs-Rails: Right Alternate Level  Stairs-Number of Steps: flight Home Access: Stairs to enter Technical brewer of Steps: 5  Discharge Living Setting Plans for Discharge Living Setting: Patient's home Type of Home at Discharge: House Discharge Home Layout: Two level Alternate Level Stairs-Rails: Right Alternate Level Stairs-Number of Steps: flight of steps Discharge Home Access: Stairs to enter Entrance Stairs-Number of Steps: 5 Does the patient have any problems obtaining your medications?: No  Social/Family/Support Systems Patient Roles: Spouse Contact Information: wife Stanton Kidney is primary contact as well as Dr. Acie Fredrickson, cardiologist at [facility] Anticipated Caregiver: wife and supportive family Anticipated Caregiver's Contact Information: see above Ability/Limitations of Caregiver: Wife has COPD and will be limited in amount of physical assist she can provide pt. Pt's son and dtr-in-law are supportive. Caregiver Availability: 24/7 Discharge Plan Discussed with Primary Caregiver: Yes (discussed by phone with wife and pt's son Dr. Acie Fredrickson) Is Caregiver In Agreement with Plan?: Yes Does Caregiver/Family have Issues with Lodging/Transportation while Pt is in Rehab?: No  Goals/Additional Needs Patient/Family Goal for Rehab: Supervision and Mod Ind with PT, OT and SLP Expected length of stay: 11-13 days Cultural Considerations: Pt is Presbyterian Dietary Needs: Low fat, low sodium diet, 1800 calorie Equipment Needs: to be determined Pt/Family Agrees to Admission and willing to participate: Yes Program Orientation Provided & Reviewed with Pt/Caregiver Including Roles & Responsibilities: Yes  Patient Condition: I met with this pt at [facility] on 06-28-14 and 06-29-14 to explain the possibility of inpatient rehab. Pt and his family are motivated to come to inpatient rehab following pt's new right frontal CVA. This 81 year old patient was previously  independent using his straight cane and enjoyed getting out of the house each day with  his wife. Pt is currently needing minimal assistance to ambulate 49' with a rolling walker and needed two rest breaks due to fatigue. In addition, pt has significant impairments with his left upper extremity and could not grip the walker well as well as pt is having significant limitations with self care tasks. Pt will benefit from the multi-disciplinary team of skilled PT, OT, SLP and rehab nursing to maximize pt's functional return following this CVA. PT, OT and rehab nursing will focus on increasing strength for greater independence with bed mobility, transfers, gait and self care skills. Further skilled speech therapy will determine if pt has any higher level cognitive needs associated with the right frontal lobe CVA. In addition, rehab nursing will address pt/family education on medication management and bowel/bladder care in preparation for home. Pt will benefit from rehab physician intervention to monitor medical status following his CVA. Discussed case with Dr. Naaman Plummer who feels that pt is a good candidate for our inpatient rehab program. We received medical clearance from Dr. Caryl Comes at [facility]. Pt and his family are motivated to come to inpatient rehab and will benefit from the intensive services of skilled therapy under rehab physician guidance. Pt will be admitted today on 06-29-14.   Preadmission Screen Completed By: Nanetta Batty, PT, 06/29/2014 10:14 AM ______________________________________________________________________  Discussed status with Dr. Naaman Plummer on 06-29-14 at 1011 and received telephone approval for admission today.  Admission Coordinator: Nanetta Batty, PT, time 1011/Date 06-29-14   Assessment/Plan: Diagnosis: right frontal infarct 1. Does the need for close, 24 hr/day Medical supervision in concert with the patient's rehab needs make it unreasonable for this patient to be served in a less intensive setting? Yes 2. Co-Morbidities requiring supervision/potential  complications: AS, ht, dn 3. Due to bladder management, bowel management, safety, skin/wound care, disease management, medication administration, pain management and patient education, does the patient require 24 hr/day rehab nursing? Yes 4. Does the patient require coordinated care of a physician, rehab nurse, PT (1-2 hrs/day, 5 days/week), OT (1-2 hrs/day, 5 days/week) and SLP (1-2 hrs/day, 5 days/week) to address physical and functional deficits in the context of the above medical diagnosis(es)? Yes Addressing deficits in the following areas: balance, endurance, locomotion, strength, transferring, bowel/bladder control, bathing, dressing, feeding, grooming, toileting, cognition, speech and language 5. Can the patient actively participate in an intensive therapy program of at least 3 hrs of therapy 5 days a week? Yes 6. The potential for patient to make measurable gains while on inpatient rehab is excellent 7. Anticipated functional outcomes upon discharge from inpatients are: modified independent and supervision PT, modified independent and supervision OT, modified independent and supervision SLP 8. Estimated rehab length of stay to reach the above functional goals is: 11-13 days 9. Does the patient have adequate social supports to accommodate these discharge functional goals? Yes 10. Anticipated D/C setting: Home 11. Anticipated post D/C treatments: HH therapy and Outpatient therapy 12. Overall Rehab/Functional Prognosis: excellent    RECOMMENDATIONS: This patient's condition is appropriate for continued rehabilitative care in the following setting: CIR Patient has agreed to participate in recommended program. Yes Note that insurance prior authorization may be required for reimbursement for recommended care.  Comment: Admit to inpatient rehab today.   Meredith Staggers, MD, [place] [facility] 06/29/2014   Nanetta Batty, PT 06/29/2014
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
## 2014-06-30 NOTE — Progress Notes (Signed)
81 year old male with H/o DM type 2, HTN, CABG with bioprosthetic AVR, A fib who has been off coumadin X 2 months due to GIB/colonic AVM. He was admitted to [facility] on 06/27/14 with complaint of left arm numbness and weakness. CT head negative for acute changes. MRI brain done revealing small acute cortical/subcortical infarct right frontal lobe in R-MCA territory, left frontal lobe encephalomalacia question prior trauma, moderate to severe cerebral atrophy and chronic infarcts in cerebellum and thalami. Carotid dopplers done revealing carotid bifurcation and proximal ICA plaque R>L resulting in less than 50% stenosis. 2D echo with normal LVF, mild LVH and normally functioning aortic valve. Neurology consulted and felt that patient with embolic stroke in setting of A fib and recommended initiating Eliquis one week past initial stroke and monitor H/H Subjective/Complaints:   Objective: Vital Signs: Blood pressure 150/53, pulse 70, temperature 97.5 F (36.4 C), temperature source Oral, resp. rate 16, height 5\' 10"  (1.778 m), weight 107.5 kg (236 lb 15.9 oz), SpO2 98 %. Dg Chest 2 View  06/29/2014   CLINICAL DATA:  Initial evaluation for shortness of breath and wheezing  EXAM: CHEST  2 VIEW  COMPARISON:  08/27/2007  FINDINGS: Moderately severe cardiac enlargement with mild vascular congestion. There is peribronchial cuffing with mild background interstitial prominence but no definite Kerley B lines. No consolidation or effusion. Status post median sternotomy. Aortic valve replacement noted.  IMPRESSION: Cardiac enlargement with vascular congestion and peribronchial cuffing and borderline interstitial prominence. Findings are concerning for minimal interstitial pulmonary edema.   Electronically Signed   By: Skipper Cliche M.D.   On: 06/29/2014 13:33   Results for orders placed or performed during the hospital encounter of 06/29/14 (from the past 72 hour(s))  Glucose, capillary     Status: Abnormal   Collection Time: 06/29/14  2:05 PM  Result Value Ref Range   Glucose-Capillary 286 (H) 70 - 99 mg/dL  Glucose, capillary     Status: Abnormal   Collection Time: 06/29/14  4:44 PM  Result Value Ref Range   Glucose-Capillary 276 (H) 70 - 99 mg/dL  Glucose, capillary     Status: Abnormal   Collection Time: 06/29/14  8:53 PM  Result Value Ref Range   Glucose-Capillary 154 (H) 70 - 99 mg/dL  Glucose, capillary     Status: Abnormal   Collection Time: 06/30/14  2:02 AM  Result Value Ref Range   Glucose-Capillary 156 (H) 70 - 99 mg/dL  CBC WITH DIFFERENTIAL     Status: Abnormal   Collection Time: 06/30/14  7:08 AM  Result Value Ref Range   WBC 6.6 4.0 - 10.5 K/uL   RBC 3.46 (L) 4.22 - 5.81 MIL/uL   Hemoglobin 9.1 (L) 13.0 - 17.0 g/dL   HCT 29.2 (L) 39.0 - 52.0 %   MCV 84.4 78.0 - 100.0 fL   MCH 26.3 26.0 - 34.0 pg   MCHC 31.2 30.0 - 36.0 g/dL   RDW 14.2 11.5 - 15.5 %   Platelets 244 150 - 400 K/uL   Neutrophils Relative % 64 43 - 77 %   Neutro Abs 4.3 1.7 - 7.7 K/uL   Lymphocytes Relative 18 12 - 46 %   Lymphs Abs 1.2 0.7 - 4.0 K/uL   Monocytes Relative 10 3 - 12 %   Monocytes Absolute 0.6 0.1 - 1.0 K/uL   Eosinophils Relative 7 (H) 0 - 5 %   Eosinophils Absolute 0.5 0.0 - 0.7 K/uL   Basophils Relative 1 0 -  1 %   Basophils Absolute 0.0 0.0 - 0.1 K/uL  Glucose, capillary     Status: Abnormal   Collection Time: 06/30/14  7:09 AM  Result Value Ref Range   Glucose-Capillary 239 (H) 70 - 99 mg/dL     HEENT: normal Cardio: RRR and no murmur Resp: CTA B/L and unlabored GI: BS positive and NT, ND Extremity:  Pulses positive and No Edema Skin:   Intact Neuro: Alert/Oriented, Agitated, Cranial Nerve II-XII normal, Abnormal Sensory reduced Left hand, Abnormal Motor 2- Wrist and finger ext 3/5 Left bicep, triceps,delt and Abnormal FMC Ataxic/ dec FMC Musc/Skel:  Other OA PIP, DIPs Gen NAD   Assessment/Plan: 1. Functional deficits secondary to Right MCA distribution infarct with  LUE> LE weakness which require 3+ hours per day of interdisciplinary therapy in a comprehensive inpatient rehab setting. Physiatrist is providing close team supervision and 24 hour management of active medical problems listed below. Physiatrist and rehab team continue to assess barriers to discharge/monitor patient progress toward functional and medical goals. FIM:                   Comprehension Comprehension Mode: Auditory Comprehension: 5-Understands basic 90% of the time/requires cueing < 10% of the time  Expression Expression Mode: Verbal Expression: 5-Expresses basic 90% of the time/requires cueing < 10% of the time.  Social Interaction Social Interaction: 5-Interacts appropriately 90% of the time - Needs monitoring or encouragement for participation or interaction.  Problem Solving Problem Solving: 5-Solves basic 90% of the time/requires cueing < 10% of the time  Memory Memory: 4-Recognizes or recalls 75 - 89% of the time/requires cueing 10 - 24% of the time  Medical Problem List and Plan: 1. Functional deficits secondary to Right frontal infarct with left hemiparesis 2. DVT Prophylaxis/Anticoagulation: Pharmaceutical: Lovenox, restart Eliquis 1/25 3. Pain Management: Tylenol  4. Mood: Monitor for lability and post CVA mood d/o 5. Neuropsych: This patient is capable of making decisions on his own behalf. 6. Skin/Wound Care: Routine pressure relief measures. Maintain adequate nutritional and hydration status. Rehab RN to monitor skin daily. 7. Fluids/Electrolytes/Nutrition: Check follow up labs in am. Offer supplements prn po intake.  8. DM type 2: Will increase lantus to 45 units. Check BS ac/hs and titrate insulin as indicated.  9. CKD: Baseline Cr-1.8. Will check labs in am to monitor stability.  10. CAD s/p AVR: On lasix, coreg, norvasc and lipitor.  11. Colonic AVM with GIB: Will order stool guaiacs. Monitor H/H for stability on lovenox. To start eliquis  next week if H/H stable.  12. A fib: Will monitor heart rate every 8 hours. Continue coreg bid.   LOS (Days) 1 A FACE TO FACE EVALUATION WAS PERFORMED  Reiley Bertagnolli E 06/30/2014, 7:39 AM

## 2014-06-30 NOTE — Care Management Note (Signed)
Inpatient [facility] Individual Statement of Services  Patient Name:  [name]  Date:  06/30/2014  Welcome to the [facility].  Our goal is to provide you with an individualized program based on your diagnosis and situation, designed to meet your specific needs.  With this comprehensive rehabilitation program, you will be expected to participate in at least 3 hours of rehabilitation therapies Monday-Friday, with modified therapy programming on the weekends.  Your rehabilitation program will include the following services:  Physical Therapy (PT), Occupational Therapy (OT), 24 hour per day rehabilitation nursing, Therapeutic Recreaction (TR), Case Management (Social Worker), Rehabilitation Medicine, Nutrition Services and Pharmacy Services and Speech Therapy  Weekly team conferences will be held on Wednesday to discuss your progress.  Your Social Worker will talk with you frequently to get your input and to update you on team discussions.  Team conferences with you and your family in attendance may also be held.  Expected length of stay: 12-14 DAYS Overall anticipated outcome: SUPERVISION/MOD/I LEVEL  Depending on your progress and recovery, your program may change. Your Social Worker will coordinate services and will keep you informed of any changes. Your Social Worker's name and contact numbers are listed  below.  The following services may also be recommended but are not provided by the [facility] will be made to provide these services after discharge if needed.  Arrangements include referral to agencies that provide these services.  Your insurance has been verified to be:  [place] Your primary doctor is:  Dr Derrel Nip  Pertinent information will be shared with your doctor and your insurance company.  Social  Worker:  Ovidio Kin, [place] or ([phone]  Information discussed with and copy given to patient by: Elease Hashimoto, 06/30/2014, 11:55 AM

## 2014-06-30 NOTE — Plan of Care (Signed)
Problem: RH PAIN MANAGEMENT Goal: RH STG PAIN MANAGED AT OR BELOW PT'S PAIN GOAL Outcome: Completed/Met Date Met:  06/30/14 Patient has had no complaints of pain.
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
## 2014-07-01 ENCOUNTER — Inpatient Hospital Stay (HOSPITAL_COMMUNITY): Payer: Medicare Other | Admitting: Physical Therapy

## 2014-07-01 ENCOUNTER — Inpatient Hospital Stay (HOSPITAL_COMMUNITY): Payer: Medicare Other | Admitting: Occupational Therapy

## 2014-07-01 DIAGNOSIS — I10 Essential (primary) hypertension: Secondary | ICD-10-CM

## 2014-07-01 LAB — GLUCOSE, CAPILLARY
GLUCOSE-CAPILLARY: 233 mg/dL — AB (ref 70–99)
GLUCOSE-CAPILLARY: 286 mg/dL — AB (ref 70–99)
Glucose-Capillary: 95 mg/dL (ref 70–99)
Glucose-Capillary: 233 mg/dL — ABNORMAL HIGH (ref 70–99)

## 2014-07-01 NOTE — Progress Notes (Signed)
Physical Therapy Session Note  Patient Details  [name] MRN: [id number] Date of Birth: 1927/12/11  Today's Date: 07/01/2014 PT Individual Time: 1340-1445 PT Individual Time Calculation (min): 65 min   Short Term Goals: Week 1:  PT Short Term Goal 1 (Week 1): Pt will perform dynamic standing 3-5 mins at S level PT Short Term Goal 2 (Week 1): Pt will ambulate x 100' with RW at S level  PT Short Term Goal 3 (Week 1): Pt will perform stand pivot transfers with LRAD at S level with mod cuing for use of LUE. PT Short Term Goal 4 (Week 1): Pt will negotiate 5 steps with single handrail at S level  Skilled Therapeutic Interventions/Progress Updates:    Pt received semi reclined in bed; agreeable to therapy. Performed dynamic standing x45 seconds with min A while using urinal.  Pt performed functional ambulation 2 x165' in controlled environment with supervision, tactile cueing for upright posture due to significant forward trunk flexion. In treatment gym, explained and demonstrated seated self-stretch of bilat hamstrings and gastrocnemius. Pt gave effective return demonstration of each stretch x1 minutes each, bilaterally, with min cueing for proper technique. Paper handout provided to facilitate carryover. Pt negotiated 12 stairs laterally with bilat UE support at L rail (per pt report of home setup) with step-to pattern and min guard. See below for detailed description of NMR performed for remainder of session.  During seated rest breaks, educated pt on forced use, reiterated encouragement for pt to utilize LUE during functional mobility/activities. Pt less resistant to this idea as session progressed. Per pt questions, educated pt on signs/symptoms of stroke. Provided paper with paper brochure to facilitate carryover. Session ended in pt room, where pt was left seated in recliner with all needs within reach.  Therapy Documentation Precautions:  Precautions Precautions: Fall Precaution  Comments: L hemi paresis Restrictions Weight Bearing Restrictions: No Vital Signs: Therapy Vitals Temp: 97.6 F (36.4 C) Temp Source: Oral Pulse Rate: 68 Resp: 20 BP: (!) 135/50 mmHg Patient Position (if appropriate): Sitting Oxygen Therapy SpO2: 99 % O2 Device: Not Delivered Pain: Pain Assessment Pain Assessment: No/denies pain Locomotion : Ambulation Ambulation/Gait Assistance: 5: Supervision  NMR: Focused on transitional movements for motor control and anterior weight shifting. Performed multiple trials of sit<>stand transfers from progressively lower mat table height with UE support; tactile cueing at L hand for forced use and at tactile cueing at R ribcage, L axilla to emphasize erect trunk flexion. Focused on slow, controlled performance of select ranges of transfer to increase motor control (focus on eccentric quadriceps during stand > sit).   See FIM for current functional status  Therapy/Group: Individual Therapy  Hobble, Malva Cogan 07/01/2014, 3:13 PM

## 2014-07-01 NOTE — Progress Notes (Signed)
Occupational Therapy Session Note  Patient Details  [name] MRN: [id number] Date of Birth: 1927-06-18  Today's Date: 07/01/2014 OT Individual Time: 3267 - 1245 and  1001-1041 OT Individual Time Calculation (min): 80 min and 40 min    Short Term Goals: Week 1:  OT Short Term Goal 1 (Week 1): Pt will utilize B UEs in self care tasks with min verbal cues to incorporate into functional activity. OT Short Term Goal 2 (Week 1): Pt will perform bathing with Min A in order to increase I in self care.  OT Short Term Goal 3 (Week 1): Pt will perform toilet transfer with Min A in order to increase I in functional transfers. OT Short Term Goal 4 (Week 1): Pt will perform LB dressing with Min A in order to increase I with self care tasks.  Skilled Therapeutic Interventions/Progress Updates:  Session 1:  Upon entering the room, pt seated on EOB finishing breakfast with RN present giving medication. Pt with no c/o pain this session. Skilled OT session with focus on self care retraining, dynamic standing balance, functional transfers, functional ambulation, and safety. Pt performed STS from EOB with min A and verbal cues for proper technique with use of RW. Pt them ambulated to bathroom with steady assist for safety. Toilet transfer with Mod A and use of RW and grab bars. Toileting performed with mod A as pt required assistance with hygiene to clean self properly after BM. Pt ambulated to walk in shower where he transferred onto shower seat with stead assist and use of RW. Bathing with Min A this session for balance and to reach feet. Pt only required min verbal cues to incorporate L UE in self care tasks. Pt was able to hold onto wash cloth with L hand in order to assist with bathing. Pt seated in recliner chair for dressing tasks. Pt remained in recliner chair with call bell and all needed items within reach upon exiting the room.   Session 2: Upon entering the room, pt seated in recliner chair  with no c/o pain this session. OT demonstrated strengthening and coordination exercises for L hand with use of red, medium resistance theraputty. Pt also demonstrating the ability to isolate finger movements in L hand. Most difficulty with movement in L thumb at this time. OT educated pt on continued use of incorporate L hand in functional tasks. Pt utilizing B hands to tear strips off of paper towel. Pt at first becoming very frustrated and with verbal cues to slow down and focus pt able to tear 5 strips from paper. OT discussed translation of this activity to being able to hold wash clothing,opening packages, and pulling up pants/underwear with this type of grasp. Pt engaged in manipulating cones by reaching in lateral and forward direct to stack. Pt having a greater difficulty with release of cones vs picking cones up. Pt remained seated in recliner chair upon exiting the room.   Therapy Documentation Precautions:  Precautions Precautions: Fall Precaution Comments: L hemi paresis Restrictions Weight Bearing Restrictions: No Vital Signs: Therapy Vitals Pulse Rate: 78 BP: 136/60 mmHg  See FIM for current functional status  Therapy/Group: Individual Therapy  [name] 07/01/2014, 10:47 AM

## 2014-07-01 NOTE — IPOC Note (Signed)
Overall Plan of Care [facility]) Patient Details Name: OVIDIO STEELE MRN: [id number] DOB: 1927-06-29  Admitting Diagnosis: R MCA CVA  Hospital Problems: Principal Problem:   Acute left hemiparesis Active Problems:   Gastrointestinal bleed   CAD (coronary artery disease)   S/P AVR   Atrial fibrillation   CVA (cerebral infarction)   HTN (hypertension)   Diabetes     Functional Problem List: Nursing Endurance, Motor, Pain, Safety, Skin Integrity  PT Balance, Endurance, Motor, Safety  OT Balance, Endurance, Motor, Safety  SLP    TR         Basic ADL's: OT Grooming, Bathing, Eating, Dressing, Toileting     Advanced  ADL's: OT Simple Meal Preparation, Laundry     Transfers: PT Bed Mobility, Bed to Chair, Musician, Manufacturing systems engineer, Metallurgist: PT Ambulation, Stairs     Additional Impairments: OT    SLP        TR      Anticipated Outcomes Item Anticipated Outcome  Self Feeding set up A  Swallowing      Basic self-care  Mod I   Toileting  Mod I    Bathroom Transfers toilet - mod I , shower trans - Mod I   Bowel/Bladder  Mod I with pads and briefs  Transfers  mod I   Locomotion  mod I (ambulatory level)  Communication     Cognition     Pain  </=3  Safety/Judgment  No falls with injury   Therapy Plan: PT Intensity: Minimum of 1-2 x/day ,45 to 90 minutes PT Frequency: 5 out of 7 days PT Duration Estimated Length of Stay: 12-14 days OT Intensity: Minimum of 1-2 x/day, 45 to 90 minutes OT Frequency: 5 out of 7 days OT Duration/Estimated Length of Stay: 12-14 days         Team Interventions: Nursing Interventions Patient/Family Education, Pain Management, Skin Care/Wound Management, Psychosocial Support  PT interventions Ambulation/gait training, Balance/vestibular training, Cognitive remediation/compensation, Discharge planning, DME/adaptive equipment instruction, Functional mobility training, Neuromuscular re-education,  Patient/family education, Stair training, Therapeutic Activities, Therapeutic Exercise, UE/LE Strength taining/ROM, UE/LE Coordination activities  OT Interventions Balance/vestibular training, Discharge planning, Pain management, Self Care/advanced ADL retraining, Therapeutic Activities, UE/LE Coordination activities, Functional mobility training, Patient/family education, Therapeutic Exercise, DME/adaptive equipment instruction, Neuromuscular re-education, Psychosocial support, UE/LE Strength taining/ROM  SLP Interventions    TR Interventions    SW/CM Interventions Discharge Planning, Patient/Family Education, Psychosocial Support    Team Discharge Planning: Destination: PT-Home ,OT- Home , SLP-  Projected Follow-up: PT-Home health PT, OT-  Home health OT, SLP-  Projected Equipment Needs: PT-To be determined, OT- To be determined, SLP-  Equipment Details: PT- , OT-Pt has RW,cane, and shower chair Patient/family involved in discharge planning: PT- Patient,  OT-Patient, SLP-   MD ELOS: 12-14 days Medical Rehab Prognosis:  Excellent Assessment: The patient has been admitted for CIR therapies with the diagnosis of Right MCA infarct. The team will be addressing functional mobility, strength, stamina, balance, safety, adaptive techniques and equipment, self-care, bowel and bladder mgt, patient and caregiver education, NMR, visual-perceptual awareness, stroke education, leisure awareness, community reintegration. Goals have been set at mod I for mobility and self-care.    Meredith Staggers, MD, FAAPMR      See Team Conference Notes for weekly updates to the plan of care

## 2014-07-01 NOTE — Progress Notes (Signed)
81 year old male with H/o DM type 2, HTN, CABG with bioprosthetic AVR, A fib who has been off coumadin X 2 months due to GIB/colonic AVM. He was admitted to [facility] on 06/27/14 with complaint of left arm numbness and weakness. CT head negative for acute changes. MRI brain done revealing small acute cortical/subcortical infarct right frontal lobe in R-MCA territory, left frontal lobe encephalomalacia question prior trauma, moderate to severe cerebral atrophy and chronic infarcts in cerebellum and thalami. Carotid dopplers done revealing carotid bifurcation and proximal ICA plaque R>L resulting in less than 50% stenosis. 2D echo with normal LVF, mild LVH and normally functioning aortic valve. 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 rate 16, height $RemoveBe'5\' 10"'frOwTrrYo$  (1.778 m), weight 104.9 kg (231 lb 4.2 oz), SpO2 99 %. Dg Chest 2 View  06/29/2014   CLINICAL DATA:  Initial evaluation for shortness of breath and wheezing  EXAM: CHEST  2 VIEW  COMPARISON:  08/27/2007  FINDINGS: Moderately severe cardiac enlargement with mild vascular congestion. There is peribronchial cuffing with mild background interstitial prominence but no definite Kerley B lines. No consolidation or effusion. Status post median sternotomy. Aortic valve replacement noted.  IMPRESSION: Cardiac enlargement with vascular congestion and peribronchial cuffing and borderline interstitial prominence. Findings are concerning for minimal interstitial pulmonary edema.    Electronically Signed   By: Skipper Cliche M.D.   On: 06/29/2014 13:33   Results for orders placed or performed during the hospital encounter of 06/29/14 (from the past 72 hour(s))  Glucose, capillary     Status: Abnormal   Collection Time: 06/29/14  2:05 PM  Result Value Ref Range   Glucose-Capillary 286 (H) 70 - 99 mg/dL  Glucose, capillary     Status: Abnormal   Collection Time: 06/29/14  4:44 PM  Result Value Ref Range   Glucose-Capillary 276 (H) 70 - 99 mg/dL  Glucose, capillary     Status: Abnormal   Collection Time: 06/29/14  8:53 PM  Result Value Ref Range   Glucose-Capillary 154 (H) 70 - 99 mg/dL  Glucose, capillary     Status: Abnormal   Collection Time: 06/30/14  2:02 AM  Result Value Ref Range   Glucose-Capillary 156 (H) 70 - 99 mg/dL  Comprehensive metabolic panel     Status: Abnormal   Collection Time: 06/30/14  7:08 AM  Result Value Ref Range   Sodium 136 135 - 145 mmol/L    Comment: Please note change in reference range.   Potassium 4.3 3.5 - 5.1 mmol/L    Comment: Please note change in reference range.   Chloride 100 96 - 112 mEq/L   CO2 27 19 - 32 mmol/L   Glucose, Bld 245 (H) 70 - 99 mg/dL   BUN 40 (H) 6 - 23 mg/dL   Creatinine, Ser 1.65 (H) 0.50 - 1.35 mg/dL   Calcium 8.8 8.4 - 10.5 mg/dL   Total Protein 6.1 6.0 - 8.3 g/dL   Albumin 3.0 (L) 3.5 - 5.2 g/dL   AST 28 0 - 37 U/L  ALT 15 0 - 53 U/L   Alkaline Phosphatase 85 39 - 117 U/L   Total Bilirubin 0.8 0.3 - 1.2 mg/dL   GFR calc non Af Amer 37 (L) >90 mL/min   GFR calc Af Amer 42 (L) >90 mL/min    Comment: (NOTE) The eGFR has been calculated using the CKD EPI equation. This calculation has not been validated in all clinical situations. eGFR's persistently <90 mL/min signify possible Chronic Kidney Disease.    Anion gap 9 5 - 15  CBC WITH DIFFERENTIAL     Status: Abnormal   Collection Time: 06/30/14  7:08 AM  Result Value Ref Range   WBC 6.6 4.0 - 10.5 K/uL   RBC 3.46 (L) 4.22 - 5.81 MIL/uL    Hemoglobin 9.1 (L) 13.0 - 17.0 g/dL   HCT 29.2 (L) 39.0 - 52.0 %   MCV 84.4 78.0 - 100.0 fL   MCH 26.3 26.0 - 34.0 pg   MCHC 31.2 30.0 - 36.0 g/dL   RDW 14.2 11.5 - 15.5 %   Platelets 244 150 - 400 K/uL   Neutrophils Relative % 64 43 - 77 %   Neutro Abs 4.3 1.7 - 7.7 K/uL   Lymphocytes Relative 18 12 - 46 %   Lymphs Abs 1.2 0.7 - 4.0 K/uL   Monocytes Relative 10 3 - 12 %   Monocytes Absolute 0.6 0.1 - 1.0 K/uL   Eosinophils Relative 7 (H) 0 - 5 %   Eosinophils Absolute 0.5 0.0 - 0.7 K/uL   Basophils Relative 1 0 - 1 %   Basophils Absolute 0.0 0.0 - 0.1 K/uL  Glucose, capillary     Status: Abnormal   Collection Time: 06/30/14  7:09 AM  Result Value Ref Range   Glucose-Capillary 239 (H) 70 - 99 mg/dL  Glucose, capillary     Status: Abnormal   Collection Time: 06/30/14 11:28 AM  Result Value Ref Range   Glucose-Capillary 297 (H) 70 - 99 mg/dL  Glucose, capillary     Status: Abnormal   Collection Time: 06/30/14  5:01 PM  Result Value Ref Range   Glucose-Capillary 201 (H) 70 - 99 mg/dL  Glucose, capillary     Status: Abnormal   Collection Time: 06/30/14  9:47 PM  Result Value Ref Range   Glucose-Capillary 210 (H) 70 - 99 mg/dL     HEENT: normal Cardio: RRR and no murmur Resp: CTA B/L and unlabored GI: BS positive and NT, ND Extremity:  Pulses positive and No Edema Skin:   Intact Neuro: Alert/Oriented, Cranial Nerve II-XII normal, Abnormal Sensory reduced Left hand, Abnormal Motor 2- Wrist and finger ext 3/5 Left bicep, triceps,delt and Abnormal FMC Ataxic/ dec FMC Musc/Skel:  Other OA PIP, DIPs Gen NAD   Assessment/Plan: 1. Functional deficits secondary to Right MCA distribution infarct with LUE> LE weakness which require 3+ hours per day of interdisciplinary therapy in a comprehensive inpatient rehab setting. Physiatrist is providing close team supervision and 24 hour management of active medical problems listed below. Physiatrist and rehab team continue to assess  barriers to discharge/monitor patient progress toward functional and medical goals. FIM: FIM - Bathing Bathing Steps Patient Completed: Chest, Left Arm, Abdomen, Front perineal area, Buttocks, Right upper leg, Left upper leg Bathing: 3: Mod-Patient completes 5-7 65f 10 parts or 50-74%  FIM - Upper Body Dressing/Undressing Upper body dressing/undressing steps patient completed: Thread/unthread right sleeve of pullover shirt/dresss, Thread/unthread left sleeve of pullover shirt/dress, Put head through opening of pull over shirt/dress,  Pull shirt over trunk, Thread/unthread right sleeve of front closure shirt/dress, Pull shirt around back of front closure shirt/dress, Thread/unthread left sleeve of front closure shirt/dress Upper body dressing/undressing: 4: Min-Patient completed 75 plus % of tasks FIM - Lower Body Dressing/Undressing Lower body dressing/undressing steps patient completed: Thread/unthread right pants leg, Pull pants up/down, Pull underwear up/down Lower body dressing/undressing: 2: Max-Patient completed 25-49% of tasks     FIM - Radio producer Devices: Elevated toilet seat, Grab bars, Insurance account manager Transfers: 4-To toilet/BSC: Min A (steadying Pt. > 75%), 3-From toilet/BSC: Mod A (lift or lower assist)  FIM - Control and instrumentation engineer Devices: Arm rests Bed/Chair Transfer: 5: Supine > Sit: Supervision (verbal cues/safety issues), 5: Sit > Supine: Supervision (verbal cues/safety issues), 4: Bed > Chair or W/C: Min A (steadying Pt. > 75%), 4: Chair or W/C > Bed: Min A (steadying Pt. > 75%)  FIM - Locomotion: Wheelchair Distance: 100 Locomotion: Wheelchair: 2: Travels 50 - 149 ft with supervision, cueing or coaxing FIM - Locomotion: Ambulation Locomotion: Ambulation Assistive Devices: Other (comment) (no AD) Ambulation/Gait Assistance: 4: Min assist Locomotion: Ambulation: 2: Travels 50 - 149 ft with minimal assistance  (Pt.>75%)  Comprehension Comprehension Mode: Auditory Comprehension: 6-Follows complex conversation/direction: With extra time/assistive device  Expression Expression Mode: Verbal Expression: 6-Expresses complex ideas: With extra time/assistive device  Social Interaction Social Interaction: 6-Interacts appropriately with others with medication or extra time (anti-anxiety, antidepressant).  Problem Solving Problem Solving: 5-Solves complex 90% of the time/cues < 10% of the time  Memory Memory: 6-More than reasonable amt of time  Medical Problem List and Plan: 1. Functional deficits secondary to Right frontal infarct with left hemiparesis 2. DVT Prophylaxis/Anticoagulation: Pharmaceutical: Lovenox, restart Eliquis 1/25 3. Pain Management: Tylenol  4. Mood: Monitor for lability and post CVA mood d/o 5. Neuropsych: This patient is capable of making decisions on his own behalf. 6. Skin/Wound Care: Routine pressure relief measures. Maintain adequate nutritional and hydration status. Rehab RN to monitor skin daily. 7. Fluids/Electrolytes/Nutrition: Check follow up labs in am. Offer supplements prn po intake.  8. DM type 2: Will increase lantus to 50 units. Check BS ac/hs and titrate insulin as indicated.  9. CKD: Baseline Cr-1.8. Will check labs in am to monitor stability.  10. CAD s/p AVR: On lasix, coreg, norvasc and lipitor.  11. Colonic AVM with GIB: Will order stool guaiacs. Monitor H/H for stability on lovenox. To start eliquis next week if H/H stable.  12. A fib: Will monitor heart rate every 8 hours. Continue coreg bid.   LOS (Days) 2 A FACE TO FACE EVALUATION WAS PERFORMED  KIRSTEINS,ANDREW E 07/01/2014, 6:36 AM

## 2014-07-02 ENCOUNTER — Inpatient Hospital Stay (HOSPITAL_COMMUNITY): Payer: Medicare Other | Admitting: Physical Therapy

## 2014-07-02 ENCOUNTER — Inpatient Hospital Stay (HOSPITAL_COMMUNITY): Payer: Medicare Other | Admitting: Occupational Therapy

## 2014-07-02 DIAGNOSIS — I4891 Unspecified atrial fibrillation: Secondary | ICD-10-CM

## 2014-07-02 LAB — GLUCOSE, CAPILLARY
GLUCOSE-CAPILLARY: 211 mg/dL — AB (ref 70–99)
GLUCOSE-CAPILLARY: 154 mg/dL — AB (ref 70–99)
Glucose-Capillary: 107 mg/dL — ABNORMAL HIGH (ref 70–99)
Glucose-Capillary: 230 mg/dL — ABNORMAL HIGH (ref 70–99)

## 2014-07-02 MED ORDER — INSULIN ASPART 100 UNIT/ML ~~LOC~~ SOLN
0.0000 [IU] | Freq: Three times a day (TID) | SUBCUTANEOUS | Status: DC
  Administered 2014-07-02: 5 [IU] via SUBCUTANEOUS
  Administered 2014-07-02: 4 [IU] via SUBCUTANEOUS
  Administered 2014-07-02: 5 [IU] via SUBCUTANEOUS
  Administered 2014-07-02: 3 [IU] via SUBCUTANEOUS
  Administered 2014-07-03 (×3): 5 [IU] via SUBCUTANEOUS
  Administered 2014-07-03: 3 [IU] via SUBCUTANEOUS
  Administered 2014-07-04: 9 [IU] via SUBCUTANEOUS
  Administered 2014-07-04: 5 [IU] via SUBCUTANEOUS
  Administered 2014-07-04: 4 [IU] via SUBCUTANEOUS
  Administered 2014-07-04: 9 [IU] via SUBCUTANEOUS
  Administered 2014-07-05: 4 [IU] via SUBCUTANEOUS
  Administered 2014-07-05: 3 [IU] via SUBCUTANEOUS
  Administered 2014-07-05: 5 [IU] via SUBCUTANEOUS
  Administered 2014-07-05: 3 [IU] via SUBCUTANEOUS
  Administered 2014-07-06: 9 [IU] via SUBCUTANEOUS
  Administered 2014-07-06: 3 [IU] via SUBCUTANEOUS
  Administered 2014-07-06: 5 [IU] via SUBCUTANEOUS
  Administered 2014-07-07: 7 [IU] via SUBCUTANEOUS
  Administered 2014-07-07 (×2): 5 [IU] via SUBCUTANEOUS
  Administered 2014-07-08: 4 [IU] via SUBCUTANEOUS
  Administered 2014-07-08 (×2): 5 [IU] via SUBCUTANEOUS
  Administered 2014-07-08 – 2014-07-09 (×2): 4 [IU] via SUBCUTANEOUS

## 2014-07-02 NOTE — Progress Notes (Signed)
Occupational Therapy Session Note  Patient Details  [name] MRN: [id number] Date of Birth: [birth year]  Today's Date: 07/02/2014 OT Individual Time: 1000-1100 and 1330-1400 OT Individual Time Calculation (min): 60 min and 30 min   Short Term Goals: Week 1:  OT Short Term Goal 1 (Week 1): Pt will utilize B UEs in self care tasks with min verbal cues to incorporate into functional activity. OT Short Term Goal 2 (Week 1): Pt will perform bathing with Min A in order to increase I in self care.  OT Short Term Goal 3 (Week 1): Pt will perform toilet transfer with Min A in order to increase I in functional transfers. OT Short Term Goal 4 (Week 1): Pt will perform LB dressing with Min A in order to increase I with self care tasks.  Skilled Therapeutic Interventions/Progress Updates:  Session 1:Upon entering the room, pt seated in recliner chair with no c/o pain. Pt performed STS from recliner chair with Min verbal cues for technique and Min A. Pt then ambulating into bathroom with RW and steady assist. Pt requiring verbal cues for safety as he was attempting to remove LB clothing while standing. He was able to verbalize why this action may be dangerous when presented with the question. Bathing completed with Min A to wash L foot this session. Dressing performed seated on EOB with therapist assist to obtain clothing from drawers. Pt reporting he utilized sock aide prior to admission but unable now secondary to decreased L grip strength. Pt very frustrated and yelling, "No, I can't do it" when educated on 1 handed technique. Pt dressed and seated in recliner chair with call bell within reach upon exiting.    Session 2: Pt transitioning easily from PT session with no c/o pain. Pt issued LH sponge to assist with washing feet and back next session. Pt engaging in B hand [place] functional task of buttoning/unbuttoning 3 large buttons from dressing board. Pt very frustrated at times and required rest  break of less than 1 minute before attempting again. OT demonstrating many strategies with pt attempting and problem solving on his own. Pt able to complete task with B hands but with increased time. Pt remained seated in recliner chair with call bell within reach upon exiting the room.   Therapy Documentation Precautions:  Precautions Precautions: Fall Precaution Comments: L hemi paresis Restrictions Weight Bearing Restrictions: No  See FIM for current functional status  Therapy/Group: Individual Therapy  [name] 07/02/2014, 11:48 AM

## 2014-07-02 NOTE — Progress Notes (Signed)
Physical Therapy Session Note  Patient Details  Name: HINTON LUELLEN MRN: [id number] Date of Birth: Jun 19, 1927  Today's Date: 07/02/2014 PT Individual Time: 1300-1330 PT Individual Time Calculation (min): 30 min   Short Term Goals: Week 1:  PT Short Term Goal 1 (Week 1): Pt will perform dynamic standing 3-5 mins at S level PT Short Term Goal 2 (Week 1): Pt will ambulate x 100' with RW at S level  PT Short Term Goal 3 (Week 1): Pt will perform stand pivot transfers with LRAD at S level with mod cuing for use of LUE. PT Short Term Goal 4 (Week 1): Pt will negotiate 5 steps with single handrail at S level  Skilled Therapeutic Interventions/Progress Updates:  Pt was seen bedside in the pm. Pt transferred supine to edge of bed with side rail, head of bed elevated and S. Pt transferred sit to stand with rolling walker and min guard. Pt ambulated about 200 feet with rolling walker and S with several standing rest breaks. In gym treatment focused on sit to stand transfers performed with min guard and verbal cues with rolling walker. Pt returned to room with rolling walker and S with several standing rest breaks.    Therapy Documentation Precautions:  Precautions Precautions: Fall Precaution Comments: L hemi paresis Restrictions Weight Bearing Restrictions: No General:   Pain: No c/o pain.    Locomotion : Ambulation Ambulation/Gait Assistance: 5: Supervision   See FIM for current functional status  Therapy/Group: Individual Therapy  Dub Amis 07/02/2014, 3:18 PM

## 2014-07-02 NOTE — Progress Notes (Signed)
Eliquis per Pharmacy for afib.  44 YOM with hx of bioprosthetic AVR and afib, admitted to [facility] on 1/18 with L arm numbness and weakness. 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 Pt. transferred to [facility] on 1/20.   - SCr 1.65, Age 81, Wt 101.5 kg so 2.5mg  po BID is appropriate dose - Hgb 9.1, plt ok. No bleeding noted. - Pt is currently on lovenox 30 mg daily for VTE prophylaxis  Plan: Eliquis 2.5mg  BID to start 1/25 Lovenox stop date 1/24 at 2359 Monitor renal function and CBC Provide Eliquis education once medication is started.  Sherlon Handing, PharmD, BCPS Clinical pharmacist, pager [phone] 07/02/2014 8:38 AM

## 2014-07-02 NOTE — Progress Notes (Signed)
81 year old male with H/o DM type 2, HTN, CABG with bioprosthetic AVR, A fib who has been off coumadin X 2 months due to GIB/colonic AVM. He was admitted to [facility] on 06/27/14 with complaint of left arm numbness and weakness. CT head negative for acute changes. MRI brain done revealing small acute cortical/subcortical infarct right frontal lobe in R-MCA territory, left frontal lobe encephalomalacia question prior trauma, moderate to severe cerebral atrophy and chronic infarcts in cerebellum and thalami. Carotid dopplers done revealing carotid bifurcation and proximal ICA plaque R>L resulting in less than 50% stenosis. 2D echo with normal LVF, mild LVH and normally functioning aortic valve. Neurology consulted and felt that patient with embolic stroke in setting of A fib and recommended initiating Eliquis one week past initial stroke and monitor H/H  Subjective/Complaints: Upset about sliding scale insulin not being correct. Otherwise doing well Review of Systems - Negative except difficulty with sleeping, up and down during the night  Objective: Vital Signs: Blood pressure 138/46, pulse 65, temperature 98.1 F (36.7 C), temperature source Oral, resp. rate 18, height $RemoveBe'5\' 10"'DeSNfeleI$  (1.778 m), weight 101.5 kg (223 lb 12.3 oz), SpO2 99 %. No results found. Results for orders placed or performed during the hospital encounter of 06/29/14 (from the past 72 hour(s))  Glucose, capillary     Status: Abnormal   Collection Time: 06/29/14  2:05 PM  Result Value Ref Range   Glucose-Capillary 286 (H) 70 - 99 mg/dL  Glucose, capillary     Status: Abnormal   Collection Time: 06/29/14  4:44 PM  Result Value Ref Range   Glucose-Capillary 276 (H) 70 - 99 mg/dL  Glucose, capillary     Status: Abnormal   Collection Time: 06/29/14  8:53 PM  Result Value Ref Range   Glucose-Capillary 154 (H) 70 - 99 mg/dL  Glucose, capillary     Status: Abnormal   Collection Time: 06/30/14  2:02 AM  Result Value Ref Range    Glucose-Capillary 156 (H) 70 - 99 mg/dL  Comprehensive metabolic panel     Status: Abnormal   Collection Time: 06/30/14  7:08 AM  Result Value Ref Range   Sodium 136 135 - 145 mmol/L    Comment: Please note change in reference range.   Potassium 4.3 3.5 - 5.1 mmol/L    Comment: Please note change in reference range.   Chloride 100 96 - 112 mEq/L   CO2 27 19 - 32 mmol/L   Glucose, Bld 245 (H) 70 - 99 mg/dL   BUN 40 (H) 6 - 23 mg/dL   Creatinine, Ser 1.65 (H) 0.50 - 1.35 mg/dL   Calcium 8.8 8.4 - 10.5 mg/dL   Total Protein 6.1 6.0 - 8.3 g/dL   Albumin 3.0 (L) 3.5 - 5.2 g/dL   AST 28 0 - 37 U/L   ALT 15 0 - 53 U/L   Alkaline Phosphatase 85 39 - 117 U/L   Total Bilirubin 0.8 0.3 - 1.2 mg/dL   GFR calc non Af Amer 37 (L) >90 mL/min   GFR calc Af Amer 42 (L) >90 mL/min    Comment: (NOTE) The eGFR has been calculated using the CKD EPI equation. This calculation has not been validated in all clinical situations. eGFR's persistently <90 mL/min signify possible Chronic Kidney Disease.    Anion gap 9 5 - 15  CBC WITH DIFFERENTIAL     Status: Abnormal   Collection Time: 06/30/14  7:08 AM  Result Value Ref Range   WBC 6.6  4.0 - 10.5 K/uL   RBC 3.46 (L) 4.22 - 5.81 MIL/uL   Hemoglobin 9.1 (L) 13.0 - 17.0 g/dL   HCT 29.2 (L) 39.0 - 52.0 %   MCV 84.4 78.0 - 100.0 fL   MCH 26.3 26.0 - 34.0 pg   MCHC 31.2 30.0 - 36.0 g/dL   RDW 14.2 11.5 - 15.5 %   Platelets 244 150 - 400 K/uL   Neutrophils Relative % 64 43 - 77 %   Neutro Abs 4.3 1.7 - 7.7 K/uL   Lymphocytes Relative 18 12 - 46 %   Lymphs Abs 1.2 0.7 - 4.0 K/uL   Monocytes Relative 10 3 - 12 %   Monocytes Absolute 0.6 0.1 - 1.0 K/uL   Eosinophils Relative 7 (H) 0 - 5 %   Eosinophils Absolute 0.5 0.0 - 0.7 K/uL   Basophils Relative 1 0 - 1 %   Basophils Absolute 0.0 0.0 - 0.1 K/uL  Glucose, capillary     Status: Abnormal   Collection Time: 06/30/14  7:09 AM  Result Value Ref Range   Glucose-Capillary 239 (H) 70 - 99 mg/dL   Glucose, capillary     Status: Abnormal   Collection Time: 06/30/14 11:28 AM  Result Value Ref Range   Glucose-Capillary 297 (H) 70 - 99 mg/dL  Glucose, capillary     Status: Abnormal   Collection Time: 06/30/14  5:01 PM  Result Value Ref Range   Glucose-Capillary 201 (H) 70 - 99 mg/dL  Glucose, capillary     Status: Abnormal   Collection Time: 06/30/14  9:47 PM  Result Value Ref Range   Glucose-Capillary 210 (H) 70 - 99 mg/dL  Glucose, capillary     Status: None   Collection Time: 07/01/14  6:51 AM  Result Value Ref Range   Glucose-Capillary 95 70 - 99 mg/dL  Glucose, capillary     Status: Abnormal   Collection Time: 07/01/14 11:22 AM  Result Value Ref Range   Glucose-Capillary 233 (H) 70 - 99 mg/dL   Comment 1 Notify RN   Glucose, capillary     Status: Abnormal   Collection Time: 07/01/14  4:23 PM  Result Value Ref Range   Glucose-Capillary 286 (H) 70 - 99 mg/dL   Comment 1 Notify RN   Glucose, capillary     Status: Abnormal   Collection Time: 07/01/14  9:03 PM  Result Value Ref Range   Glucose-Capillary 233 (H) 70 - 99 mg/dL  Glucose, capillary     Status: Abnormal   Collection Time: 07/02/14  6:50 AM  Result Value Ref Range   Glucose-Capillary 107 (H) 70 - 99 mg/dL     HEENT: normal Cardio: RRR and no murmur Resp: CTA B/L and unlabored GI: BS positive and NT, ND Extremity:  Pulses positive and No Edema Skin:   Intact Neuro: Alert/Oriented, Cranial Nerve II-XII normal, Abnormal Sensory reduced Left hand, Abnormal Motor 2- Wrist and finger ext 3/5 Left bicep, triceps,delt and Abnormal FMC Ataxic/ dec FMC Musc/Skel:  Other OA PIP, DIPs Gen NAD   Assessment/Plan: 1. Functional deficits secondary to Right MCA distribution infarct with LUE> LE weakness which require 3+ hours per day of interdisciplinary therapy in a comprehensive inpatient rehab setting. Physiatrist is providing close team supervision and 24 hour management of active medical problems listed  below. Physiatrist and rehab team continue to assess barriers to discharge/monitor patient progress toward functional and medical goals. FIM: FIM - Bathing Bathing Steps Patient Completed: Chest, Left Arm, Abdomen,  Front perineal area, Buttocks, Right upper leg, Left upper leg Bathing: 3: Mod-Patient completes 5-7 29f 10 parts or 50-74%  FIM - Upper Body Dressing/Undressing Upper body dressing/undressing steps patient completed: Thread/unthread right sleeve of pullover shirt/dresss, Thread/unthread left sleeve of pullover shirt/dress, Put head through opening of pull over shirt/dress, Pull shirt over trunk, Thread/unthread right sleeve of front closure shirt/dress, Pull shirt around back of front closure shirt/dress, Thread/unthread left sleeve of front closure shirt/dress Upper body dressing/undressing: 4: Min-Patient completed 75 plus % of tasks FIM - Lower Body Dressing/Undressing Lower body dressing/undressing steps patient completed: Thread/unthread left underwear leg, Thread/unthread left pants leg, Thread/unthread right pants leg, Pull pants up/down Lower body dressing/undressing: 2: Max-Patient completed 25-49% of tasks  FIM - Toileting Toileting steps completed by patient: Adjust clothing prior to toileting, Performs perineal hygiene Toileting Assistive Devices: Grab bar or rail for support Toileting: 3: Mod-Patient completed 2 of 3 steps  FIM - Radio producer Devices: Environmental consultant, Product manager Transfers: 4-To toilet/BSC: Min A (steadying Pt. > 75%), 3-From toilet/BSC: Mod A (lift or lower assist)  FIM - Control and instrumentation engineer Devices: Arm rests Bed/Chair Transfer: 4: Bed > Chair or W/C: Min A (steadying Pt. > 75%), 4: Chair or W/C > Bed: Min A (steadying Pt. > 75%)  FIM - Locomotion: Wheelchair Distance: 100 Locomotion: Wheelchair: 2: Travels 37 - 149 ft with supervision, cueing or coaxing FIM - Locomotion:  Ambulation Locomotion: Ambulation Assistive Devices: Administrator Ambulation/Gait Assistance: 5: Supervision Locomotion: Ambulation: 5: Travels 150 ft or more with supervision/safety issues  Comprehension Comprehension Mode: Auditory Comprehension: 6-Follows complex conversation/direction: With extra time/assistive device  Expression Expression Mode: Verbal Expression: 6-Expresses complex ideas: With extra time/assistive device  Social Interaction Social Interaction: 6-Interacts appropriately with others with medication or extra time (anti-anxiety, antidepressant).  Problem Solving Problem Solving: 5-Solves complex 90% of the time/cues < 10% of the time  Memory Memory: 6-More than reasonable amt of time  Medical Problem List and Plan: 1. Functional deficits secondary to Right frontal infarct with left hemiparesis 2. DVT Prophylaxis/Anticoagulation: Pharmaceutical: Lovenox, restart Eliquis 1/25 3. Pain Management: Tylenol  4. Mood: Monitor for lability and post CVA mood d/o 5. Neuropsych: This patient is capable of making decisions on his own behalf. 6. Skin/Wound Care: Routine pressure relief measures. Maintain adequate nutritional and hydration status. Rehab RN to monitor skin daily. 7. Fluids/Electrolytes/Nutrition: increased lantus to 50 units. Check BS ac/hs and titrate insulin as indicated.   -adjusted SSI to patient's preference 9. CKD: Baseline Cr-1.8. Will check labs in am to monitor stability.  10. CAD s/p AVR: On lasix, coreg, norvasc and lipitor.  11. Colonic AVM with GIB: checking stool guaiacs. Monitor H/H for stability on lovenox. To start eliquis next week if H/H stable.  -CBC ordered for Monday 12. A fib: Will monitor heart rate every 8 hours. Continue coreg bid.   LOS (Days) 3 A FACE TO FACE EVALUATION WAS PERFORMED  Rayvon Dakin T 07/02/2014, 10:44 AM

## 2014-07-02 NOTE — Progress Notes (Signed)
Physical Therapy Session Note  Patient Details  [name] MRN: [id number] Date of Birth: February 21, 1928  Today's Date: 07/02/2014 PT Individual Time: 0800-0900 PT Individual Time Calculation (min): 60 min   Short Term Goals: Week 1:  PT Short Term Goal 1 (Week 1): Pt will perform dynamic standing 3-5 mins at S level PT Short Term Goal 2 (Week 1): Pt will ambulate x 100' with RW at S level  PT Short Term Goal 3 (Week 1): Pt will perform stand pivot transfers with LRAD at S level with mod cuing for use of LUE. PT Short Term Goal 4 (Week 1): Pt will negotiate 5 steps with single handrail at S level  Skilled Therapeutic Interventions/Progress Updates:  Pt was seen beside in the am, sitting on edge of bed. Pt transferred edge of bed to w/c with rolling walker and min guard with verbal cues. Pt ambulated around room with rolling walker and S with occasional verbal cues. Pt propelled w/c about 100 feet with B LEs and S with verbal cues for encouragement. Pt performed multiple transfers with S to min guard with rolling walker and verbal cues. Treatment in gym focused on LE strengthening and NMR through step taps, cone taps and criss cross cone taps. Pt ambulated 40 feet without assistive device and min A with wide BOS. Pt ambulated with SPC and min guard for 130 feet. Pt required min A for sit to stand transfers without the use of the walker. Pt returned to room. Pt transferred w/c to edge of bed with min guard to min A.   Therapy Documentation Precautions:  Precautions Precautions: Fall Precaution Comments: L hemi paresis Restrictions Weight Bearing Restrictions: No General:   Pain: Pt c/o mod pain L hand.    Locomotion : Ambulation Ambulation/Gait Assistance: 5: Supervision   See FIM for current functional status  Therapy/Group: Individual Therapy  Dub Amis 07/02/2014, 12:24 PM

## 2014-07-03 ENCOUNTER — Inpatient Hospital Stay (HOSPITAL_COMMUNITY): Payer: Medicare Other | Admitting: Physical Therapy

## 2014-07-03 DIAGNOSIS — I639 Cerebral infarction, unspecified: Secondary | ICD-10-CM

## 2014-07-03 LAB — GLUCOSE, CAPILLARY
GLUCOSE-CAPILLARY: 232 mg/dL — AB (ref 70–99)
Glucose-Capillary: 114 mg/dL — ABNORMAL HIGH (ref 70–99)
Glucose-Capillary: 202 mg/dL — ABNORMAL HIGH (ref 70–99)
Glucose-Capillary: 205 mg/dL — ABNORMAL HIGH (ref 70–99)

## 2014-07-03 NOTE — Progress Notes (Addendum)
81 year old male with H/o DM type 2, HTN, CABG with bioprosthetic AVR, A fib who has been off coumadin X 2 months due to GIB/colonic AVM. He was admitted to [facility] on 06/27/14 with complaint of left arm numbness and weakness. CT head negative for acute changes. MRI brain done revealing small acute cortical/subcortical infarct right frontal lobe in R-MCA territory, left frontal lobe encephalomalacia question prior trauma, moderate to severe cerebral atrophy and chronic infarcts in cerebellum and thalami. Carotid dopplers done revealing carotid bifurcation and proximal ICA plaque R>L resulting in less than 50% stenosis. 2D echo with normal LVF, mild LVH and normally functioning aortic valve. Neurology consulted and felt that patient with embolic stroke in setting of A fib and recommended initiating Eliquis one week past initial stroke and monitor H/H  Subjective/Complaints: Pleased with SSI now. Sugars showed some improvement yesterday Review of Systems - Negative except difficulty with sleeping, up and down during the night  Objective: Vital Signs: Blood pressure 141/64, pulse 64, temperature 97.8 F (36.6 C), temperature source Oral, resp. rate 18, height 5\' 10"  (1.778 m), weight 101.5 kg (223 lb 12.3 oz), SpO2 98 %. No results found. Results for orders placed or performed during the hospital encounter of 06/29/14 (from the past 72 hour(s))  Glucose, capillary     Status: Abnormal   Collection Time: 06/30/14 11:28 AM  Result Value Ref Range   Glucose-Capillary 297 (H) 70 - 99 mg/dL  Glucose, capillary     Status: Abnormal   Collection Time: 06/30/14  5:01 PM  Result Value Ref Range   Glucose-Capillary 201 (H) 70 - 99 mg/dL  Glucose, capillary     Status: Abnormal   Collection Time: 06/30/14  9:47 PM  Result Value Ref Range   Glucose-Capillary 210 (H) 70 - 99 mg/dL  Glucose, capillary     Status: None   Collection Time: 07/01/14  6:51 AM  Result Value Ref Range   Glucose-Capillary 95 70 -  99 mg/dL  Glucose, capillary     Status: Abnormal   Collection Time: 07/01/14 11:22 AM  Result Value Ref Range   Glucose-Capillary 233 (H) 70 - 99 mg/dL   Comment 1 Notify RN   Glucose, capillary     Status: Abnormal   Collection Time: 07/01/14  4:23 PM  Result Value Ref Range   Glucose-Capillary 286 (H) 70 - 99 mg/dL   Comment 1 Notify RN   Glucose, capillary     Status: Abnormal   Collection Time: 07/01/14  9:03 PM  Result Value Ref Range   Glucose-Capillary 233 (H) 70 - 99 mg/dL  Glucose, capillary     Status: Abnormal   Collection Time: 07/02/14  6:50 AM  Result Value Ref Range   Glucose-Capillary 107 (H) 70 - 99 mg/dL  Glucose, capillary     Status: Abnormal   Collection Time: 07/02/14 11:11 AM  Result Value Ref Range   Glucose-Capillary 211 (H) 70 - 99 mg/dL  Glucose, capillary     Status: Abnormal   Collection Time: 07/02/14  5:00 PM  Result Value Ref Range   Glucose-Capillary 230 (H) 70 - 99 mg/dL  Glucose, capillary     Status: Abnormal   Collection Time: 07/02/14 10:01 PM  Result Value Ref Range   Glucose-Capillary 154 (H) 70 - 99 mg/dL  Glucose, capillary     Status: Abnormal   Collection Time: 07/03/14  6:13 AM  Result Value Ref Range   Glucose-Capillary 114 (H) 70 - 99 mg/dL  HEENT: normal Cardio: Irregular and mild systolic murmur Resp: CTA B/L and unlabored GI: BS positive and NT, ND Extremity:  Pulses positive and No Edema Skin:   Intact Neuro: Alert/Oriented, Cranial Nerve II-XII normal, Abnormal Sensory reduced Left hand, Abnormal Motor 2- Wrist and finger ext 3/5 Left bicep, triceps,delt and Abnormal FMC Ataxic/ dec FMC Musc/Skel:  Other OA PIP, DIPs Gen NAD   Assessment/Plan: 1. Functional deficits secondary to Right MCA distribution infarct with LUE> LE weakness which require 3+ hours per day of interdisciplinary therapy in a comprehensive inpatient rehab setting. Physiatrist is providing close team supervision and 24 hour management of active  medical problems listed below. Physiatrist and rehab team continue to assess barriers to discharge/monitor patient progress toward functional and medical goals. FIM: FIM - Bathing Bathing Steps Patient Completed: Chest, Right Arm, Left Arm, Abdomen, Front perineal area, Buttocks, Right upper leg, Left upper leg, Right lower leg (including foot) Bathing: 4: Min-Patient completes 8-9 61f 10 parts or 75+ percent  FIM - Upper Body Dressing/Undressing Upper body dressing/undressing steps patient completed: Thread/unthread right sleeve of pullover shirt/dresss, Thread/unthread left sleeve of pullover shirt/dress, Put head through opening of pull over shirt/dress, Pull shirt over trunk, Thread/unthread right sleeve of front closure shirt/dress, Pull shirt around back of front closure shirt/dress, Thread/unthread left sleeve of front closure shirt/dress Upper body dressing/undressing: 4: Min-Patient completed 75 plus % of tasks FIM - Lower Body Dressing/Undressing Lower body dressing/undressing steps patient completed: Thread/unthread left underwear leg, Thread/unthread left pants leg, Thread/unthread right pants leg, Pull pants up/down Lower body dressing/undressing: 2: Max-Patient completed 25-49% of tasks  FIM - Toileting Toileting steps completed by patient: Adjust clothing prior to toileting, Performs perineal hygiene Toileting Assistive Devices: Grab bar or rail for support Toileting: 3: Mod-Patient completed 2 of 3 steps  FIM - Radio producer Devices: Environmental consultant, Product manager Transfers: 4-To toilet/BSC: Min A (steadying Pt. > 75%), 4-From toilet/BSC: Min A (steadying Pt. > 75%)  FIM - Bed/Chair Transfer Bed/Chair Transfer Assistive Devices: Copy: 5: Bed > Chair or W/C: Supervision (verbal cues/safety issues), 5: Chair or W/C > Bed: Supervision (verbal cues/safety issues)  FIM - Locomotion: Wheelchair Distance: 100 Locomotion: Wheelchair: 2:  Travels 60 - 149 ft with supervision, cueing or coaxing FIM - Locomotion: Ambulation Locomotion: Ambulation Assistive Devices: Administrator Ambulation/Gait Assistance: 5: Supervision Locomotion: Ambulation: 5: Travels 150 ft or more with supervision/safety issues  Comprehension Comprehension Mode: Auditory Comprehension: 6-Follows complex conversation/direction: With extra time/assistive device  Expression Expression Mode: Verbal Expression: 6-Expresses complex ideas: With extra time/assistive device  Social Interaction Social Interaction: 6-Interacts appropriately with others with medication or extra time (anti-anxiety, antidepressant).  Problem Solving Problem Solving: 6-Solves complex problems: With extra time  Memory Memory: 6-More than reasonable amt of time  Medical Problem List and Plan: 1. Functional deficits secondary to Right frontal infarct with left hemiparesis 2. DVT Prophylaxis/Anticoagulation: Pharmaceutical: Lovenox, restart Eliquis 1/25 3. Pain Management: Tylenol  4. Mood: Monitor for lability and post CVA mood d/o 5. Neuropsych: This patient is capable of making decisions on his own behalf. 6. Skin/Wound Care: Routine pressure relief measures. Maintain adequate nutritional and hydration status. Rehab RN to monitor skin daily. 7. Fluids/Electrolytes/Nutrition: increased lantus to 50 units---may need further tiration   -adjusted SSI to patient's preference  -RN needs to look for custom scale "behind" visible scale seen on orders---poor EMR design scheme  -follow today for pattern 9. CKD: Baseline Cr-1.8. Will check labs in am  to monitor stability.  10. CAD s/p AVR: On lasix, coreg, norvasc and lipitor.  11. Colonic AVM with GIB: checking stool guaiacs. Monitor H/H for stability on lovenox. To start eliquis next week if H/H stable.  -CBC ordered for Monday 12. A fib: Will monitor heart rate every 8 hours. Continue coreg bid.   LOS (Days) 4 A FACE TO  FACE EVALUATION WAS PERFORMED  Ryley Teater T 07/03/2014, 8:34 AM

## 2014-07-03 NOTE — Progress Notes (Signed)
Physical Therapy Session Note  Patient Details  [name] MRN: [id number] Date of Birth: [birth year]  Today's Date: 07/03/2014 PT Individual Time: 0800-0900 PT Individual Time Calculation (min): 60 min   Short Term Goals: Week 1:  PT Short Term Goal 1 (Week 1): Pt will perform dynamic standing 3-5 mins at S level PT Short Term Goal 2 (Week 1): Pt will ambulate x 100' with RW at S level  PT Short Term Goal 3 (Week 1): Pt will perform stand pivot transfers with LRAD at S level with mod cuing for use of LUE. PT Short Term Goal 4 (Week 1): Pt will negotiate 5 steps with single handrail at S level  Skilled Therapeutic Interventions/Progress Updates:  Pt was seen bedside in the am. Pt transferred supine to edge of bed with side rail, head of bed elevated and S. Pt transferred from edge of bed with rolling walker and S. Pt ambulated around the room with rolling walker and S. Pt ambulated from room to rehab gym with rolling walker and S with several standing rest breaks. In gym treatment focused on LE strengthening and sit to stand transfers from varying heights. Pt ascended/descended 4, 2 and 8 stairs with 1 rail, SPC and min A with verbal cues. Pt ambulated back to room with rolling walker and S, several standing rest breaks. Pt left sitting on edge of bed with all needs within reach.  Therapy Documentation Precautions:  Precautions Precautions: Fall Precaution Comments: L hemi paresis Restrictions Weight Bearing Restrictions: No General:    Pain: No c/o pain.    Locomotion : Ambulation Ambulation/Gait Assistance: 5: Supervision   See FIM for current functional status  Therapy/Group: Individual Therapy  Dub Amis 07/03/2014, 11:37 AM

## 2014-07-04 ENCOUNTER — Inpatient Hospital Stay (HOSPITAL_COMMUNITY): Payer: Medicare Other | Admitting: Physical Therapy

## 2014-07-04 ENCOUNTER — Inpatient Hospital Stay (HOSPITAL_COMMUNITY): Payer: Medicare Other

## 2014-07-04 ENCOUNTER — Encounter (HOSPITAL_COMMUNITY): Payer: Medicare Other

## 2014-07-04 DIAGNOSIS — E119 Type 2 diabetes mellitus without complications: Secondary | ICD-10-CM

## 2014-07-04 LAB — CBC
HEMATOCRIT: 29.9 % — AB (ref 39.0–52.0)
Hemoglobin: 9.3 g/dL — ABNORMAL LOW (ref 13.0–17.0)
MCH: 26.1 pg (ref 26.0–34.0)
MCHC: 31.1 g/dL (ref 30.0–36.0)
MCV: 83.8 fL (ref 78.0–100.0)
Platelets: 271 10*3/uL (ref 150–400)
RBC: 3.57 MIL/uL — ABNORMAL LOW (ref 4.22–5.81)
RDW: 13.9 % (ref 11.5–15.5)
WBC: 5.9 10*3/uL (ref 4.0–10.5)

## 2014-07-04 LAB — GLUCOSE, CAPILLARY
GLUCOSE-CAPILLARY: 243 mg/dL — AB (ref 70–99)
GLUCOSE-CAPILLARY: 314 mg/dL — AB (ref 70–99)
Glucose-Capillary: 153 mg/dL — ABNORMAL HIGH (ref 70–99)
Glucose-Capillary: 330 mg/dL — ABNORMAL HIGH (ref 70–99)
Glucose-Capillary: 307 mg/dL — ABNORMAL HIGH (ref 70–99)

## 2014-07-04 MED ORDER — INSULIN ASPART 100 UNIT/ML ~~LOC~~ SOLN: 10.0000 [IU] | Freq: Once | SUBCUTANEOUS | Status: DC

## 2014-07-04 MED ORDER — BACITRACIN ZINC 500 UNIT/GM EX OINT
TOPICAL_OINTMENT | Freq: Two times a day (BID) | CUTANEOUS | Status: DC
  Administered 2014-07-04 (×2): via TOPICAL
  Administered 2014-07-05: 1 via TOPICAL
  Administered 2014-07-05 – 2014-07-06 (×2): via TOPICAL
  Administered 2014-07-07: 1 via TOPICAL
  Administered 2014-07-07 – 2014-07-09 (×4): via TOPICAL
  Filled 2014-07-04: qty 28.35

## 2014-07-04 NOTE — Plan of Care (Signed)
Problem: RH SKIN INTEGRITY Goal: RH STG SKIN FREE OF INFECTION/BREAKDOWN Min  Outcome: Progressing Rash healing with topical medication

## 2014-07-04 NOTE — Progress Notes (Signed)
Physical Therapy Session Note  Patient Details  [name] MRN: [id number] Date of Birth: 08-17-1927  Today's Date: 07/04/2014 PT Individual Time: 1950-9326 PT Individual Time Calculation (min): 37 min   Short Term Goals: Week 1:  PT Short Term Goal 1 (Week 1): Pt will perform dynamic standing 3-5 mins at S level PT Short Term Goal 2 (Week 1): Pt will ambulate x 100' with RW at S level  PT Short Term Goal 3 (Week 1): Pt will perform stand pivot transfers with LRAD at S level with mod cuing for use of LUE. PT Short Term Goal 4 (Week 1): Pt will negotiate 5 steps with single handrail at S level  Skilled Therapeutic Interventions/Progress Updates:    neuromuscular re-education via hand out , VCS, tactile cues, demo for: -seated hamstring, seated heel cord stretches x 1 minutes, L and R  -seated bil hip adduction/hip IR against resistance x 10 for better foot position sit>< stand -seated gentle extension mobilization lumbar spine x 10 seconds x 4 -standing hamstrings and heel cord stretches (runner's stretch) at sink counter, x 1 minute L and R  Pt reported he could use wall clock to time his stretches provided it is off the wall, near him so that he can see it. Pt is very limited in LE and spinal flexibility.  He stated his back felt good after exs.  Pt left seated in recliner.  NT informed that pt would like to use BR soon.  Therapy Documentation Precautions:  Precautions Precautions: Fall Precaution Comments: L hemi paresis Restrictions Weight Bearing Restrictions: No Pain: Pain Assessment Pain Assessment: No/denies pain    See FIM for current functional status  Therapy/Group: Individual Therapy  Caelan Branden 07/04/2014, 3:39 PM

## 2014-07-04 NOTE — Progress Notes (Signed)
Occupational Therapy Session Note  Patient Details  [name] MRN: [id number] Date of Birth: [birth year]  Today's Date: 07/04/2014 OT Individual Time: 1100-1200 OT Individual Time Calculation (min): 60 min    Short Term Goals: Week 1:  OT Short Term Goal 1 (Week 1): Pt will utilize B UEs in self care tasks with min verbal cues to incorporate into functional activity. OT Short Term Goal 2 (Week 1): Pt will perform bathing with Min A in order to increase I in self care.  OT Short Term Goal 3 (Week 1): Pt will perform toilet transfer with Min A in order to increase I in functional transfers. OT Short Term Goal 4 (Week 1): Pt will perform LB dressing with Min A in order to increase I with self care tasks.  Skilled Therapeutic Interventions/Progress Updates:    Pt engaged in BADL retraining including bathing at shower level and dressing with sit<>stand from chair. Pt amb with RW to bathroom for transfer to shower.  Pt completed bathing using long handle sponge to assist with LB bathing.  Pt required steady A when standing in shower.  Pt continues to demonstrate decreased proprioception/sensation in Lt hand along with decreased thumb extension and grasp strength.  Pt required assistance with pulling up pants and donning socks.  Pt noted with increased frustration with inability to complete tasks requiring BUE use.  Focus on activity tolerance, sit<>stand, standing balance, functional amb with RW, increased LUE use and FMS.    Therapy Documentation Precautions:  Precautions Precautions: Fall Precaution Comments: L hemi paresis Restrictions Weight Bearing Restrictions: No  Pain: Pain Assessment Pain Assessment: No/denies pain  See FIM for current functional status  Therapy/Group: Individual Therapy  Leroy Libman 07/04/2014, 12:12 PM

## 2014-07-04 NOTE — Progress Notes (Signed)
Physical Therapy Session Note  Patient Details  [name] MRN: [id number] Date of Birth: 09-08-1927  Today's Date: 07/04/2014 PT Individual Time: 1761-6073 and 7106-2694  PT Individual Time Calculation (min): 60 min and 30 min  Short Term Goals: Week 1:  PT Short Term Goal 1 (Week 1): Pt will perform dynamic standing 3-5 mins at S level PT Short Term Goal 2 (Week 1): Pt will ambulate x 100' with RW at S level  PT Short Term Goal 3 (Week 1): Pt will perform stand pivot transfers with LRAD at S level with mod cuing for use of LUE. PT Short Term Goal 4 (Week 1): Pt will negotiate 5 steps with single handrail at S level  Skilled Therapeutic Interventions/Progress Updates:   Session 1: Pt received semi reclined in bed, agreeable to therapy but requesting to get dressed before leaving room. Pt transferred to edge of bed with use of bed rail and supervision. Pt very resistant to attempting UB/LB dressing with use of LUE and continued to insist he could not use it and therapist needed to help him. Eventually pt agreeable to attempting dressing with BUE before having therapist assist as needed. Total assist to don socks and shoes due to time constraints. Pt ambulated in room to sink to wash dentures and wash hands and to bathroom using RW with supervision. Pt performed toilet transfer with supervision and use of grab bar, assist for clothing management. After washing hands, pt ambulated from room to ortho gym using RW with distant supervision. Pt requires cues for safe hand placement and use of RW with sit <> stand transfers. Pt unable to recall LE stretching program provided by previous therapist. Performed seated BLE stretching x 1 min each for hamstring and gastroc. Pt performed car transfer to sedan height using RW with supervision. Pt reports he must negotiate up/down 16 stairs to reach bedroom and shower at home. Pt negotiated up/down 9 stairs and 12 stairs using L rail ascending (BUE on  rail) with step-to pattern with seated rest break in between. Pt self elected to ascend with LLE and descend with LLE, cues to descend with RLE due to history of R knee problems per patient. Pt performed short distance ambulation around ortho gym without AD and close supervision. Initiated Berg Balance Scale with patient, to be completed during PM session. Pt ambulated back to room using RW with supervision and left sitting in recliner with all needs within reach.   Session 2: Pt received sitting in recliner, agreeable to therapy. Pt ambulated from room <> ortho gym using RW with supervision. Pt instructed to push up from surface with BUE for sit > stand instead of pushing up with R hand and LUE on RW for increased safety. Completed remainder of Berg Balance Scale. Patient demonstrates increased fall risk as noted by score of  25/56 on Berg Balance Scale (<36= high risk for falls, close to 100%; 37-45 significant >80%; 46-51 moderate >50%; 52-55 lower >25%). Pt educated on being a high falls risk and verbalized understanding. Pt returned to room and left sitting in recliner with all needs within reach.  Therapy Documentation Precautions:  Precautions Precautions: Fall Precaution Comments: L hemi paresis Restrictions Weight Bearing Restrictions: No Vital Signs: Therapy Vitals Pulse Rate: 66 BP: (!) 146/60 mmHg Pain:  Denied pain  See FIM for current functional status  Therapy/Group: Individual Therapy  Laretta Alstrom 07/04/2014, 10:15 AM

## 2014-07-04 NOTE — Progress Notes (Signed)
Inpatient Diabetes Program Recommendations  AACE/ADA: New Consensus Statement on Inpatient Glycemic Control (2013)  Target Ranges:  Prepandial:   less than 140 mg/dL      Peak postprandial:   less than 180 mg/dL (1-2 hours)      Critically ill patients:  140 - 180 mg/dL     Results for [name], [name] (MRN [id number]) as of 07/04/2014 11:06  Ref. Range 07/03/2014 06:13 07/03/2014 11:12 07/03/2014 16:29 07/03/2014 20:10  Glucose-Capillary Latest Range: 70-99 mg/dL 114 (H) 202 (H) 205 (H) 232 (H)     **Patient eating 100% of meals  **Elevated postprandial glucose levels    MD- Please consider adding Novolog Meal Coverage to current regimen to be given with current SSI dose-  Novolog 3 units tid with meals (hold if patient eats <50% of meal)     Will follow Wyn Quaker RN, MSN, CDE Diabetes Coordinator Inpatient Diabetes Program Team Pager: [phone] (8a-10p)

## 2014-07-04 NOTE — Progress Notes (Signed)
Inpatient Diabetes Program Recommendations  AACE/ADA: New Consensus Statement on Inpatient Glycemic Control (2013)  Target Ranges:  Prepandial:   less than 140 mg/dL      Peak postprandial:   less than 180 mg/dL (1-2 hours)      Critically ill patients:  140 - 180 mg/dL    Inpatient Diabetes Program Recommendations Correction (SSI): Continue as ordered. Insulin - Meal Coverage: Please order meal coverage for this patient. Fasting glucose is controlled well with present dose of lantus, but post-prandial (before lunch and supper) cbg's are high. Pls start with 4 units novolog meal coverage  Thank you, Rosita Kea, RN, CNS, Diabetes Coordinator  Pager [phone]) 8:00 am to 10:00pm Office [phone])  8:69m - 5:00 pm

## 2014-07-04 NOTE — Progress Notes (Signed)
81 year old male with H/o DM type 2, HTN, CABG with bioprosthetic AVR, A fib who has been off coumadin X 2 months due to GIB/colonic AVM. He was admitted to [facility] on 06/27/14 with complaint of left arm numbness and weakness. CT head negative for acute changes. MRI brain done revealing small acute cortical/subcortical infarct right frontal lobe in R-MCA territory, left frontal lobe encephalomalacia question prior trauma, moderate to severe cerebral atrophy and chronic infarcts in cerebellum and thalami. Carotid dopplers done revealing carotid bifurcation and proximal ICA plaque R>L resulting in less than 50% stenosis. 2D echo with normal LVF, mild LVH and normally functioning aortic valve. 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 rate 18, height 5\' 10"  (1.778 m), weight 101.5 kg (223 lb 12.3 oz), SpO2 100 %. No results found. Results for orders placed or performed during the hospital encounter of 06/29/14 (from the past 72 hour(s))  Glucose, capillary     Status: Abnormal   Collection Time: 07/01/14 11:22 AM  Result Value Ref Range   Glucose-Capillary 233 (H) 70 - 99 mg/dL   Comment 1 Notify RN   Glucose, capillary     Status: Abnormal   Collection Time: 07/01/14  4:23 PM  Result Value Ref Range   Glucose-Capillary 286 (H) 70 - 99 mg/dL   Comment 1 Notify RN   Glucose, capillary     Status: Abnormal   Collection Time: 07/01/14  9:03 PM  Result Value Ref Range   Glucose-Capillary 233 (H) 70 - 99 mg/dL  Glucose,  capillary     Status: Abnormal   Collection Time: 07/02/14  6:50 AM  Result Value Ref Range   Glucose-Capillary 107 (H) 70 - 99 mg/dL  Glucose, capillary     Status: Abnormal   Collection Time: 07/02/14 11:11 AM  Result Value Ref Range   Glucose-Capillary 211 (H) 70 - 99 mg/dL  Glucose, capillary     Status: Abnormal   Collection Time: 07/02/14  5:00 PM  Result Value Ref Range   Glucose-Capillary 230 (H) 70 - 99 mg/dL  Glucose, capillary     Status: Abnormal   Collection Time: 07/02/14 10:01 PM  Result Value Ref Range   Glucose-Capillary 154 (H) 70 - 99 mg/dL  Glucose, capillary     Status: Abnormal   Collection Time: 07/03/14  6:13 AM  Result Value Ref Range   Glucose-Capillary 114 (H) 70 - 99 mg/dL  Glucose, capillary     Status: Abnormal   Collection Time: 07/03/14 11:12 AM  Result Value Ref Range   Glucose-Capillary 202 (H) 70 - 99 mg/dL  Glucose, capillary     Status: Abnormal   Collection Time: 07/03/14  4:29 PM  Result Value Ref Range   Glucose-Capillary 205 (H) 70 - 99 mg/dL  Glucose, capillary     Status: Abnormal   Collection Time: 07/03/14  8:10 PM  Result Value Ref Range   Glucose-Capillary 232 (H) 70 - 99 mg/dL  CBC     Status: Abnormal   Collection Time: 07/04/14  6:15 AM  Result Value Ref Range   WBC 5.9 4.0 - 10.5 K/uL   RBC 3.57 (L) 4.22 - 5.81 MIL/uL   Hemoglobin 9.3 (L) 13.0 - 17.0 g/dL   HCT 29.9 (L) 39.0 - 52.0 %   MCV 83.8 78.0 - 100.0 fL   MCH 26.1 26.0 - 34.0 pg   MCHC 31.1 30.0 - 36.0 g/dL   RDW 13.9 11.5 - 15.5 %   Platelets 271 150 - 400 K/uL  Glucose, capillary     Status: Abnormal   Collection Time: 07/04/14  7:18 AM  Result Value Ref Range   Glucose-Capillary 153 (H) 70 - 99 mg/dL     HEENT: normal Cardio: Irregular and mild systolic murmur Resp: CTA B/L and unlabored GI: BS positive and NT, ND Extremity:  Pulses positive and No Edema Skin:   Intact Neuro: Alert/Oriented, Cranial Nerve II-XII normal, Abnormal Sensory reduced Left  hand, Abnormal Motor 2- Wrist and finger ext 3/5 Left bicep, triceps,delt and Abnormal FMC Ataxic/ dec FMC Musc/Skel:  Other OA PIP, DIPs Gen NAD   Assessment/Plan: 1. Functional deficits secondary to Right MCA distribution infarct with LUE> LE weakness which require 3+ hours per day of interdisciplinary therapy in a comprehensive inpatient rehab setting. Physiatrist is providing close team supervision and 24 hour management of active medical problems listed below. Physiatrist and rehab team continue to assess barriers to discharge/monitor patient progress toward functional and medical goals. FIM: FIM - Bathing Bathing Steps Patient Completed: Chest, Right Arm, Left Arm, Abdomen, Front perineal area, Buttocks, Right upper leg, Left upper leg, Right lower leg (including foot) Bathing: 4: Min-Patient completes 8-9 39f 10 parts or 75+ percent  FIM - Upper Body Dressing/Undressing Upper body dressing/undressing steps patient completed: Thread/unthread right sleeve of pullover shirt/dresss, Thread/unthread left sleeve of pullover shirt/dress, Put head through opening of pull over shirt/dress, Pull shirt over trunk, Thread/unthread right sleeve of front closure shirt/dress, Pull shirt around back of front closure shirt/dress, Thread/unthread left sleeve of front closure shirt/dress Upper body dressing/undressing: 4: Min-Patient completed 75 plus % of tasks FIM - Lower Body Dressing/Undressing Lower body dressing/undressing steps patient completed: Thread/unthread left underwear leg, Thread/unthread left pants leg, Thread/unthread right pants leg, Pull pants up/down Lower body dressing/undressing: 2: Max-Patient completed 25-49% of tasks  FIM - Toileting Toileting steps completed by patient: Adjust clothing prior to toileting, Performs perineal hygiene Toileting Assistive Devices: Grab bar or rail for support Toileting: 3: Mod-Patient completed 2 of 3 steps  FIM - Engineer, structural Devices: Environmental consultant, Product manager Transfers: 4-To toilet/BSC: Min A (steadying Pt. > 75%), 4-From toilet/BSC: Min A (steadying Pt. > 75%)  FIM - Bed/Chair Transfer Bed/Chair Transfer Assistive Devices: Walker, HOB elevated, Bed rails Bed/Chair Transfer: 5: Supine > Sit: Supervision (verbal cues/safety issues), 5: Bed > Chair or W/C: Supervision (verbal cues/safety issues), 5: Chair or W/C > Bed: Supervision (verbal cues/safety issues)  FIM - Locomotion: Wheelchair Distance: 100 Locomotion: Wheelchair: 2: Travels 45 - 149 ft with supervision, cueing or coaxing FIM - Locomotion: Ambulation Locomotion: Ambulation Assistive Devices: Administrator Ambulation/Gait Assistance: 5: Supervision Locomotion: Ambulation: 5: Travels 150 ft or more with supervision/safety issues  Comprehension Comprehension Mode: Auditory Comprehension: 6-Follows complex conversation/direction: With extra time/assistive device  Expression Expression Mode: Verbal Expression: 6-Expresses complex ideas: With extra time/assistive device  Social Interaction Social Interaction: 6-Interacts appropriately with others with medication or extra time (anti-anxiety, antidepressant).  Problem Solving Problem Solving: 6-Solves complex problems: With extra time  Memory Memory:  6-More than reasonable amt of time  Medical Problem List and Plan: 1. Functional deficits secondary to Right frontal infarct with left hemiparesis 2. DVT Prophylaxis/Anticoagulation: Pharmaceutical:d/c  Lovenox, restart Eliquis 1/25 3. Pain Management: Tylenol  4. Mood: Monitor for lability and post CVA mood d/o 5. Neuropsych: This patient is capable of making decisions on his own behalf. 6. Skin/Wound Care: Routine pressure relief measures. Maintain adequate nutritional and hydration status. Rehab RN to monitor skin daily. 7. Fluids/Electrolytes/Nutrition: increased lantus to 50 units---may need further tiration   -adjusted SSI to  patient's preference  -  -follow today for pattern 9. CKD: Baseline Cr-1.8. Will check labs in am to monitor stability.  10. CAD s/p AVR: On lasix, coreg, norvasc and lipitor.  11. Colonic AVM with GIB: checking stool guaiacs. To start eliquis since  H/H stable. Daily H & H -12. A fib: Will monitor heart rate every 8 hours. Continue coreg bid.   LOS (Days) 5 A FACE TO FACE EVALUATION WAS PERFORMED  Adin Lariccia E 07/04/2014, 7:48 AM

## 2014-07-05 ENCOUNTER — Inpatient Hospital Stay (HOSPITAL_COMMUNITY): Payer: Medicare Other | Admitting: *Deleted

## 2014-07-05 ENCOUNTER — Inpatient Hospital Stay (HOSPITAL_COMMUNITY): Payer: Medicare Other | Admitting: Physical Therapy

## 2014-07-05 ENCOUNTER — Inpatient Hospital Stay (HOSPITAL_COMMUNITY): Payer: Medicare Other | Admitting: Occupational Therapy

## 2014-07-05 LAB — GLUCOSE, CAPILLARY
Glucose-Capillary: 119 mg/dL — ABNORMAL HIGH (ref 70–99)
Glucose-Capillary: 182 mg/dL — ABNORMAL HIGH (ref 70–99)
Glucose-Capillary: 143 mg/dL — ABNORMAL HIGH (ref 70–99)
Glucose-Capillary: 214 mg/dL — ABNORMAL HIGH (ref 70–99)

## 2014-07-05 LAB — HEMOGLOBIN AND HEMATOCRIT, BLOOD
HEMATOCRIT: 28.3 % — AB (ref 39.0–52.0)
Hemoglobin: 8.9 g/dL — ABNORMAL LOW (ref 13.0–17.0)

## 2014-07-05 MED ORDER — INSULIN ASPART 100 UNIT/ML ~~LOC~~ SOLN
4.0000 [IU] | Freq: Three times a day (TID) | SUBCUTANEOUS | Status: DC
  Administered 2014-07-06: 4 [IU] via SUBCUTANEOUS

## 2014-07-05 NOTE — Progress Notes (Signed)
Occupational Therapy Session Note  Patient Details  [name] MRN: [id number] Date of Birth: 1927/12/23  Today's Date: 07/05/2014 OT Individual Time: 1405-1505 OT Individual Time Calculation (min): 60 min    Short Term Goals: Week 1:  OT Short Term Goal 1 (Week 1): Pt will utilize B UEs in self care tasks with min verbal cues to incorporate into functional activity. OT Short Term Goal 2 (Week 1): Pt will perform bathing with Min A in order to increase I in self care.  OT Short Term Goal 3 (Week 1): Pt will perform toilet transfer with Min A in order to increase I in functional transfers. OT Short Term Goal 4 (Week 1): Pt will perform LB dressing with Min A in order to increase I with self care tasks.  Skilled Therapeutic Interventions/Progress Updates:    Engaged in [place] and therapeutic activity with focus on functional use of LUE.  Provided pt with HEP handout for theraputty to promote active use of LUE.  Pt reports having received putty but not performing activities, educated pt on repetition and carryover outside of therapy sessions to maximize his recovery.  Pt return demonstrated each activity with increased time and education regarding carryover to functional tasks.  Pt reports decreased sensation in Lt first finger and difficulty with picking up coins from table.  Utilized LUE as gross assist with opening and pouring drink.  Therapy Documentation Precautions:  Precautions Precautions: Fall Precaution Comments: L hemi paresis Restrictions Weight Bearing Restrictions: No General:   Vital Signs: Therapy Vitals Temp: 98 F (36.7 C) Temp Source: Oral Pulse Rate: 70 Resp: 18 BP: (!) 142/58 mmHg Patient Position (if appropriate): Sitting Oxygen Therapy SpO2: 99 % O2 Device: Not Delivered Pain: Pain Assessment Pain Assessment: No/denies pain  See FIM for current functional status  Therapy/Group: Individual Therapy  Simonne Come 07/05/2014, 4:00 PM

## 2014-07-05 NOTE — Progress Notes (Signed)
Physical Therapy Session Note  Patient Details  [name] MRN: [id number] Date of Birth: [birth year]  Today's Date: 07/05/2014 PT Individual Time: 0900-1000 PT Individual Time Calculation (min): 60 min   Short Term Goals: Week 1:  PT Short Term Goal 1 (Week 1): Pt will perform dynamic standing 3-5 mins at S level PT Short Term Goal 2 (Week 1): Pt will ambulate x 100' with RW at S level  PT Short Term Goal 3 (Week 1): Pt will perform stand pivot transfers with LRAD at S level with mod cuing for use of LUE. PT Short Term Goal 4 (Week 1): Pt will negotiate 5 steps with single handrail at S level  Skilled Therapeutic Interventions/Progress Updates:    Patient received sitting in recliner. Session focused on gait training, functional transfers, L UE/LE NMR. Patient performed dressing while sitting in recliner, able to recite steps without cues, stands to pull up pants; patient requires assistance to bring shirt around trunk, fasten buttons, and pull up pants and button/zip pants. Functional ambulation with RW >150' x2 in controlled environment with supervision.  Block practice x10 sit<>stands with L UE on L LE with R UE on top to facilitate increasing weight bearing through L UE/LE during functional transfer without UE support. Patient able to return demonstrate LE stretching program, progressed seated hamstring and gastoc stretches to each LE elevated on small inclined stool to facilitate more intense stretch; patient also utilized sheet for gastroc stretch. Stair negotiation x12 stairs with L handrail to simulate home environment, ascends/descends sideways with minA secondary to inappropriate pace; requires verbal cues for safety with foot placement and proper sequencing. Additionally, patient unaware that he had one remaining step at end of descent and requires minA to maintain balance.  Patient educated throughout session to push up with L UE, not only for safety, but for L UE NMR/weight  bearing. Patient returned to room and left sitting in recliner with all needs within reach.  Therapy Documentation Precautions:  Precautions Precautions: Fall Precaution Comments: L hemi paresis Restrictions Weight Bearing Restrictions: No Pain: Pain Assessment Pain Assessment: No/denies pain Pain Score: 0-No pain Locomotion : Ambulation Ambulation/Gait Assistance: 5: Supervision   See FIM for current functional status  Therapy/Group: Individual Therapy  Lillia Abed. Doryce Mcgregory, PT, DPT 07/05/2014, 9:49 AM

## 2014-07-05 NOTE — Progress Notes (Signed)
Physical Therapy Session Note  Patient Details  [name] MRN: [id number] Date of Birth: 01/21/1928  Today's Date: 07/05/2014 PT Individual Time: 1105-1205 PT Individual Time Calculation (min): 60 min   Short Term Goals: Week 1:  PT Short Term Goal 1 (Week 1): Pt will perform dynamic standing 3-5 mins at S level PT Short Term Goal 2 (Week 1): Pt will ambulate x 100' with RW at S level  PT Short Term Goal 3 (Week 1): Pt will perform stand pivot transfers with LRAD at S level with mod cuing for use of LUE. PT Short Term Goal 4 (Week 1): Pt will negotiate 5 steps with single handrail at S level  Skilled Therapeutic Interventions/Progress Updates:   Pt received sitting in recliner, agreeable to therapy. Patient ambulated 2 x > 150 ft using RW with distant supervision. Session focused on pt education, LUE/LLE NMR, and postural control. L UE neuro re-education with focus on left elbow, wrist, and finger/thumb AROM in all planes and active assisted ROM or resistance using RUE as appropriate. Pt performed weightbearing through LUE (with cues to extend all fingers) seated edge of mat, in quadruped, and in standing at table while performing functional tasks/reaching with RUE. Patient instructed in anterior pelvic tilt in sitting and quadruped requiring max verbal/tactile cues and demonstration to achieve lumbopelvic dissociation. Pt returned to room and left sitting in recliner with all needs within reach.   Therapy Documentation Precautions:  Precautions Precautions: Fall Precaution Comments: L hemi paresis Restrictions Weight Bearing Restrictions: No Pain: Pain Assessment Pain Assessment: No/denies pain Pain Score: 0-No pain Locomotion : Ambulation Ambulation/Gait Assistance: 5: Supervision     See FIM for current functional status  Therapy/Group: Individual Therapy  Laretta Alstrom 07/05/2014, 12:17 PM

## 2014-07-05 NOTE — Progress Notes (Signed)
81 year old male with H/o DM type 2, HTN, CABG with bioprosthetic AVR, A fib who has been off coumadin X 2 months due to GIB/colonic AVM. He was admitted to [facility] on 06/27/14 with complaint of left arm numbness and weakness. CT head negative for acute changes. MRI brain done revealing small acute cortical/subcortical infarct right frontal lobe in R-MCA territory, left frontal lobe encephalomalacia question prior trauma, moderate to severe cerebral atrophy and chronic infarcts in cerebellum and thalami. Carotid dopplers done revealing carotid bifurcation and proximal ICA plaque R>L resulting in less than 50% stenosis. 2D echo with normal LVF, mild LVH and normally functioning aortic valve. Neurology consulted and felt that patient with embolic stroke in setting of A fib and recommended initiating Eliquis one week past initial stroke and monitor H/H  Subjective/Complaints: Appreciate DM coordinator note No blood in stool noted yesterday.  H and H pending Review of Systems - Negative except difficulty with sleeping, up and down during the night  Objective: Vital Signs: Blood pressure 133/50, pulse 80, temperature 98 F (36.7 C), temperature source Oral, resp. rate 18, height 5\' 10"  (1.778 m), weight 103.5 kg (228 lb 2.8 oz), SpO2 97 %. No results found. Results for orders placed or performed during the hospital encounter of 06/29/14 (from the past 72 hour(s))  Glucose, capillary     Status: Abnormal   Collection Time: 07/02/14 11:11 AM  Result Value Ref Range   Glucose-Capillary 211 (H) 70 - 99 mg/dL  Glucose, capillary     Status: Abnormal   Collection Time: 07/02/14  5:00 PM  Result Value Ref Range   Glucose-Capillary 230 (H) 70 - 99 mg/dL  Glucose, capillary     Status: Abnormal   Collection Time: 07/02/14 10:01 PM  Result Value Ref Range   Glucose-Capillary 154 (H) 70 - 99 mg/dL  Glucose, capillary     Status: Abnormal   Collection Time: 07/03/14  6:13 AM  Result Value Ref Range    Glucose-Capillary 114 (H) 70 - 99 mg/dL  Glucose, capillary     Status: Abnormal   Collection Time: 07/03/14 11:12 AM  Result Value Ref Range   Glucose-Capillary 202 (H) 70 - 99 mg/dL  Glucose, capillary     Status: Abnormal   Collection Time: 07/03/14  4:29 PM  Result Value Ref Range   Glucose-Capillary 205 (H) 70 - 99 mg/dL  Glucose, capillary     Status: Abnormal   Collection Time: 07/03/14  8:10 PM  Result Value Ref Range   Glucose-Capillary 232 (H) 70 - 99 mg/dL  CBC     Status: Abnormal   Collection Time: 07/04/14  6:15 AM  Result Value Ref Range   WBC 5.9 4.0 - 10.5 K/uL   RBC 3.57 (L) 4.22 - 5.81 MIL/uL   Hemoglobin 9.3 (L) 13.0 - 17.0 g/dL   HCT 29.9 (L) 39.0 - 52.0 %   MCV 83.8 78.0 - 100.0 fL   MCH 26.1 26.0 - 34.0 pg   MCHC 31.1 30.0 - 36.0 g/dL   RDW 13.9 11.5 - 15.5 %   Platelets 271 150 - 400 K/uL  Glucose, capillary     Status: Abnormal   Collection Time: 07/04/14  7:18 AM  Result Value Ref Range   Glucose-Capillary 153 (H) 70 - 99 mg/dL  Glucose, capillary     Status: Abnormal   Collection Time: 07/04/14 11:53 AM  Result Value Ref Range   Glucose-Capillary 243 (H) 70 - 99 mg/dL  Glucose, capillary  Status: Abnormal   Collection Time: 07/04/14  4:43 PM  Result Value Ref Range   Glucose-Capillary 330 (H) 70 - 99 mg/dL  Glucose, capillary     Status: Abnormal   Collection Time: 07/04/14  8:38 PM  Result Value Ref Range   Glucose-Capillary 314 (H) 70 - 99 mg/dL   Comment 1 Notify RN   Glucose, capillary     Status: Abnormal   Collection Time: 07/04/14  9:37 PM  Result Value Ref Range   Glucose-Capillary 307 (H) 70 - 99 mg/dL   Comment 1 Notify RN   Hemoglobin and hematocrit, blood     Status: Abnormal   Collection Time: 07/05/14  5:55 AM  Result Value Ref Range   Hemoglobin 8.9 (L) 13.0 - 17.0 g/dL   HCT 28.3 (L) 39.0 - 52.0 %  Glucose, capillary     Status: Abnormal   Collection Time: 07/05/14  6:51 AM  Result Value Ref Range   Glucose-Capillary  119 (H) 70 - 99 mg/dL   Comment 1 Notify RN      HEENT: normal Cardio: Irregular and mild systolic murmur Resp: CTA B/L and unlabored GI: BS positive and NT, ND Extremity:  Pulses positive and No Edema Skin:   Intact Neuro: Alert/Oriented, Cranial Nerve II-XII normal, Abnormal Sensory reduced Left hand, Abnormal Motor 2- Wrist and finger ext 3+/5 Left bicep, triceps,delt and Abnormal FMC Ataxic/ dec FMC Musc/Skel:  Other OA PIP, DIPs Gen NAD   Assessment/Plan: 1. Functional deficits secondary to Right MCA distribution infarct with LUE> LE weakness which require 3+ hours per day of interdisciplinary therapy in a comprehensive inpatient rehab setting. Physiatrist is providing close team supervision and 24 hour management of active medical problems listed below. Physiatrist and rehab team continue to assess barriers to discharge/monitor patient progress toward functional and medical goals. FIM: FIM - Bathing Bathing Steps Patient Completed: Chest, Right Arm, Left Arm, Abdomen, Front perineal area, Buttocks, Right upper leg, Left upper leg, Right lower leg (including foot), Left lower leg (including foot) Bathing: 4: Steadying assist  FIM - Upper Body Dressing/Undressing Upper body dressing/undressing steps patient completed: Thread/unthread right sleeve of pullover shirt/dresss, Thread/unthread left sleeve of pullover shirt/dress, Put head through opening of pull over shirt/dress, Pull shirt over trunk, Thread/unthread right sleeve of front closure shirt/dress, Pull shirt around back of front closure shirt/dress, Thread/unthread left sleeve of front closure shirt/dress Upper body dressing/undressing: 4: Min-Patient completed 75 plus % of tasks FIM - Lower Body Dressing/Undressing Lower body dressing/undressing steps patient completed: Thread/unthread left underwear leg, Thread/unthread left pants leg, Thread/unthread right pants leg, Pull pants up/down Lower body dressing/undressing: 2:  Max-Patient completed 25-49% of tasks  FIM - Toileting Toileting steps completed by patient: Performs perineal hygiene, Adjust clothing prior to toileting Toileting Assistive Devices: Grab bar or rail for support Toileting: 3: Mod-Patient completed 2 of 3 steps  FIM - Radio producer Devices: Environmental consultant, Product manager Transfers: 5-To toilet/BSC: Supervision (verbal cues/safety issues), 5-From toilet/BSC: Supervision (verbal cues/safety issues)  FIM - Control and instrumentation engineer Devices: Bed rails Bed/Chair Transfer: 5: Bed > Chair or W/C: Supervision (verbal cues/safety issues), 5: Chair or W/C > Bed: Supervision (verbal cues/safety issues)  FIM - Locomotion: Wheelchair Distance: 100 Locomotion: Wheelchair: 0: Activity did not occur FIM - Locomotion: Ambulation Locomotion: Ambulation Assistive Devices: Administrator Ambulation/Gait Assistance: 5: Supervision Locomotion: Ambulation: 2: Travels 50 - 149 ft with supervision/safety issues  Comprehension Comprehension Mode: Auditory Comprehension: 6-Follows complex conversation/direction:  With extra time/assistive device  Expression Expression Mode: Verbal Expression: 6-Expresses complex ideas: With extra time/assistive device  Social Interaction Social Interaction: 6-Interacts appropriately with others with medication or extra time (anti-anxiety, antidepressant).  Problem Solving Problem Solving: 5-Solves basic problems: With no assist  Memory Memory: 5-Recognizes or recalls 90% of the time/requires cueing < 10% of the time  Medical Problem List and Plan: 1. Functional deficits secondary to Right frontal infarct with left hemiparesis 2. DVT Prophylaxis/Anticoagulation: Pharmaceutical:d/c  Lovenox, restart Eliquis 1/25 3. Pain Management: Tylenol  4. Mood: Monitor for lability and post CVA mood d/o 5. Neuropsych: This patient is capable of making decisions on his own  behalf. 6. Skin/Wound Care: Routine pressure relief measures. Maintain adequate nutritional and hydration status. Rehab RN to monitor skin daily. 7. Fluids/Electrolytes/Nutrition: increased lantus to 50 units---may need further tiration   -adjusted SSI to patient's preference  -  -follow today for pattern 9. CKD: Baseline Cr-1.8. Will check labs in am to monitor stability.  10. CAD s/p AVR: On lasix, coreg, norvasc and lipitor.  11. Colonic AVM with GIB: checking stool guaiacs. To start eliquis since  H/H stable. Daily H & H -12. A fib: Will monitor heart rate every 8 hours. Continue coreg bid.   LOS (Days) 6 A FACE TO FACE EVALUATION WAS PERFORMED  Rhemi Balbach E 07/05/2014, 7:40 AM

## 2014-07-05 NOTE — Progress Notes (Signed)
ANTICOAGULATION CONSULT NOTE - Follow up Pharmacy Consult for Eliquis (apixaban) Indication: atrial fibrillation  Allergies  Allergen Reactions  . Asa [Aspirin]   . Metoprolol     bradycardia  . Penicillins     Swelling     Patient Measurements: Height: 5\' 10"  (177.8 cm) Weight: 228 lb 2.8 oz (103.5 kg) IBW/kg (Calculated) : 73   Vital Signs: Temp: 98 F (36.7 C) (01/26 0501) Temp Source: Oral (01/26 0501) BP: 134/64 mmHg (01/26 0808) Pulse Rate: 80 (01/26 0808)  Labs:  Recent Labs  07/04/14 0615 07/05/14 0555  HGB 9.3* 8.9*  HCT 29.9* 28.3*  PLT 271  --     Estimated Creatinine Clearance: 40.2 mL/min (by C-G formula based on Cr of 1.65).   Medical History: Past Medical History  Diagnosis Date  . Aortic stenosis, severe   . Hypertension   . Diabetes mellitus   . OA (osteoarthritis)   . Junctional bradycardia   . BPH (benign prostatic hypertrophy)   . Hyperlipidemia   . Anemia   . Gastrointestinal bleed   . CAD (coronary artery disease)   . Mobitz type 1 second degree atrioventricular block 10/01/2012  . History of bladder cancer   . Macular degeneration     Medications:  Prescriptions prior to admission  Medication Sig Dispense Refill Last Dose  . Ascorbic Acid (VITAMIN C PO) Take by mouth.     06/29/2014 at Unknown time  . carvedilol (COREG) 6.25 MG tablet Take 6.25 mg by mouth 2 (two) times daily with a meal.    06/29/2014 at 8a  . cholecalciferol (VITAMIN D) 1000 UNITS tablet Take 1,000 Units by mouth daily.     06/29/2014 at Unknown time  . furosemide (LASIX) 40 MG tablet Take 20 mg by mouth daily.    06/29/2014 at Unknown time  . insulin glargine (LANTUS) 100 UNIT/ML injection Inject 40 Units into the skin at bedtime.    06/29/2014 at Unknown time  . Multiple Vitamin (MULTI-VITAMIN PO) Take by mouth.     06/29/2014 at Unknown time  . pantoprazole (PROTONIX) 40 MG tablet Take 40 mg by mouth daily.     06/28/2014 at Unknown time  . insulin lispro  (HUMALOG) 100 UNIT/ML injection Inject into the skin 4 (four) times daily - after meals and at bedtime.    Taking   Scheduled:  . amLODipine  5 mg Oral Daily  . apixaban  2.5 mg Oral BID  . atorvastatin  40 mg Oral q1800  . bacitracin   Topical BID  . carvedilol  6.25 mg Oral BID WC  . furosemide  20 mg Oral Daily  . insulin aspart  0-10 Units Subcutaneous TID AC & HS  . insulin aspart  4 Units Subcutaneous TID WC  . insulin glargine  50 Units Subcutaneous Daily  . iron polysaccharides  150 mg Oral BID  . losartan  25 mg Oral Daily  . multivitamin with minerals  1 tablet Oral Daily  . multivitamin-lutein  1 capsule Oral BID  . nitroGLYCERIN  0.5 inch Topical BID  . pantoprazole  40 mg Oral Daily  . tamsulosin  0.4 mg Oral QPC supper  . triamcinolone 0.1 % cream : eucerin   Topical TID  . vitamin C  500 mg Oral Daily    Assessment: 61 YOM with hx of bioprosthetic AVR and afib, admitted to [facility] on 1/18 with L arm numbness and weakness. He had been off coumadin X 2 months due to GIB/colonic AVM.  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 Patient was transferred to [facility] on 1/20.   Eliquis started on 1/25 at appropriate dose of 2.5 mg po BID.   CKD: Baseline Cr-1.8.   SCr was 1.65 on 06/30/14, Age 81, Wt 103.55 kg  2.5mg  po BID is appropriate dose.  BMET pending in AM. Will f/u SCr.  Hgb 8.9 < 9.3, pltc 271. No bleeding noted.    Plan:  Continue Eliquis 2.5mg  BID  Monitor renal function and CBC.  BMET pending in AM. Provided Eliquis education on 07/04/14.   Thank you for allowing pharmacy to be part of this patients care team. Nicole Cella, RPh Clinical Pharmacist Pager: [phone] 07/05/2014,11:21 AM

## 2014-07-06 ENCOUNTER — Inpatient Hospital Stay (HOSPITAL_COMMUNITY): Payer: Medicare Other | Admitting: Rehabilitation

## 2014-07-06 ENCOUNTER — Inpatient Hospital Stay (HOSPITAL_COMMUNITY): Payer: Medicare Other | Admitting: *Deleted

## 2014-07-06 ENCOUNTER — Inpatient Hospital Stay (HOSPITAL_COMMUNITY): Payer: Medicare Other

## 2014-07-06 ENCOUNTER — Inpatient Hospital Stay (HOSPITAL_COMMUNITY): Payer: Medicare Other | Admitting: Occupational Therapy

## 2014-07-06 ENCOUNTER — Inpatient Hospital Stay (HOSPITAL_COMMUNITY): Payer: Medicare Other | Admitting: Physical Therapy

## 2014-07-06 DIAGNOSIS — E1140 Type 2 diabetes mellitus with diabetic neuropathy, unspecified: Secondary | ICD-10-CM

## 2014-07-06 DIAGNOSIS — E1165 Type 2 diabetes mellitus with hyperglycemia: Secondary | ICD-10-CM | POA: Diagnosis present

## 2014-07-06 DIAGNOSIS — IMO0002 Reserved for concepts with insufficient information to code with codable children: Secondary | ICD-10-CM | POA: Diagnosis present

## 2014-07-06 LAB — BASIC METABOLIC PANEL
Anion gap: 10 (ref 5–15)
BUN: 41 mg/dL — ABNORMAL HIGH (ref 6–23)
CO2: 26 mmol/L (ref 19–32)
CREATININE: 1.61 mg/dL — AB (ref 0.50–1.35)
Calcium: 9.3 mg/dL (ref 8.4–10.5)
Chloride: 101 mmol/L (ref 96–112)
GFR calc Af Amer: 44 mL/min — ABNORMAL LOW (ref >90)
GFR, EST NON AFRICAN AMERICAN: 38 mL/min — AB (ref >90)
Glucose, Bld: 113 mg/dL — ABNORMAL HIGH (ref 70–99)
Potassium: 4.3 mmol/L (ref 3.5–5.1)
Sodium: 137 mmol/L (ref 135–145)

## 2014-07-06 LAB — GLUCOSE, CAPILLARY
GLUCOSE-CAPILLARY: 52 mg/dL — AB (ref 70–99)
GLUCOSE-CAPILLARY: 183 mg/dL — AB (ref 70–99)
Glucose-Capillary: 133 mg/dL — ABNORMAL HIGH (ref 70–99)
Glucose-Capillary: 70 mg/dL (ref 70–99)
Glucose-Capillary: 326 mg/dL — ABNORMAL HIGH (ref 70–99)
Glucose-Capillary: 224 mg/dL — ABNORMAL HIGH (ref 70–99)

## 2014-07-06 LAB — HEMOGLOBIN AND HEMATOCRIT, BLOOD
HCT: 32.6 % — ABNORMAL LOW (ref 39.0–52.0)
HEMOGLOBIN: 10.1 g/dL — AB (ref 13.0–17.0)

## 2014-07-06 NOTE — Progress Notes (Signed)
Occupational Therapy Session Note  Patient Details  [name] MRN: [id number] Date of Birth: [birth year]  Today's Date: 07/06/2014 OT Individual Time: 2707-8675 OT Individual Time Calculation (min): 60 min    Short Term Goals: Week 1:  OT Short Term Goal 1 (Week 1): Pt will utilize B UEs in self care tasks with min verbal cues to incorporate into functional activity. OT Short Term Goal 2 (Week 1): Pt will perform bathing with Min A in order to increase I in self care.  OT Short Term Goal 3 (Week 1): Pt will perform toilet transfer with Min A in order to increase I in functional transfers. OT Short Term Goal 4 (Week 1): Pt will perform LB dressing with Min A in order to increase I with self care tasks.  Skilled Therapeutic Interventions/Progress Updates:  Upon entering the room, pt supine in bed with RN present giving medication. Pt with no c/o pain this session. Skilled OT session with focus on self care retraining, dynamic standing balance, functional mobility, functional transfers, and pt education. Pt required min verbal cues with dressing tasks for pursed lip breathing as pt observed to be holding breath at times and reports fatigue. Supine >sit with supervision to EOB. STS with min verbal cues for proper technique and supervision. Ambulation into bathroom with RW for toilet transfer with supervision. Toileting with supervision for clothing management and hygiene. Bathing at shower level with steady assist when standing to wash buttocks. Pt returned to sit on EOB for dressing tasks. Pt donned shirt with supervision and min verbal cues for proper technique. Seated pt donned pants with steady assist and sat down with success in fastening pants today with B hands. Shoes and socks not donned as pt getting vinegar bath for R foot. Pt seated in recliner chair with R foot in vinegar bath and set up A to open containers for breakfast. Call bell and all other needed items within reach upon  exiting the room.   Therapy Documentation Precautions:  Precautions Precautions: Fall Precaution Comments: L hemi paresis Restrictions Weight Bearing Restrictions: No Pain: Pain Assessment Pain Assessment: No/denies pain  See FIM for current functional status  Therapy/Group: Individual Therapy  [name] 07/06/2014, 11:17 AM

## 2014-07-06 NOTE — Progress Notes (Signed)
Hypoglycemic Event  CBG: 52  Treatment: Graham crackers and sprite   Symptoms: Shaky  Follow-up CBG: Time:1153 CBG Result:70  Possible Reasons for Event: Unknown  Comments/MD notified:Pam Love, PA notified. Pt eating lunch and will recheck.     Kirkland Hun A  Remember to initiate Hypoglycemia Order Set & complete

## 2014-07-06 NOTE — Patient Care Conference (Signed)
Inpatient RehabilitationTeam Conference and Plan of Care Update Date: 07/06/2014   Time: 12;00 PM    Patient [name]      Medical Record Number: [id number]  Date of Birth: [birth year] Sex: Male         Room/Bed: 4M11C/4M11C-01 Payor Info: Payor: MEDICARE / Plan: MEDICARE PART A AND B / Product Type: *No Product type* /    Admitting Diagnosis: R MCA CVA  Admit Date/Time:  06/29/2014 11:40 AM Admission Comments: No comment available   Primary Diagnosis:  Acute left hemiparesis Principal Problem: Acute left hemiparesis  Patient Active Problem List   Diagnosis Date Noted  . Type 2 diabetes, controlled, with peripheral neuropathy 07/06/2014  . HTN (hypertension) 06/30/2014  . Diabetes 06/30/2014  . CVA (cerebral infarction) 06/29/2014  . Acute left hemiparesis 06/29/2014  . Anemia 11/10/2013  . Atrial fibrillation 05/12/2013  . Mobitz type 1 second degree atrioventricular block 10/01/2012  . S/P AVR 03/24/2012  . Aortic stenosis, severe   . Hypertension   . Junctional bradycardia   . Hyperlipidemia   . Gastrointestinal bleed   . CAD (coronary artery disease)     Expected Discharge Date: Expected Discharge Date: 07/09/14  Team Members Present: Physician leading conference: Dr. Alysia Penna Social Worker Present: Ovidio Kin, LCSW Nurse Present: Heather Roberts, RN PT Present: Raylene Everts, PT;Arlisa Leclere Varner, Jimmie Molly, PT OT Present: Clyda Greener, Rhetta Mura, OT SLP Present: Gunnar Fusi, SLP PPS Coordinator present : Daiva Nakayama, RN, CRRN     Current Status/Progress Goal Weekly Team Focus  Medical   Acute on chronic anemia, Anticoagulation was restarted,, chronic GI bleed  Maintain medical stability for home discharge  Monitor hemoglobin treat anemia   Bowel/Bladder   Continent of bowel and bladder. LBM 07/04/14. Urinary urgency, wears pad in brief  Pt to remain continent of bowel and baldder  Monitor   Swallow/Nutrition/ Hydration     na         ADL's   steady assist for dynamic standing in shower and LB dressing, UB dressing with supervision, toileting supervision, toilet transfer with grab bar with supervision, shower transfer with supervision  Mod I - supervision  dynamic standing balance, L UE NMR, functional ambulation, functional transfers, pt and family education   Mobility   supervision up to 150 ft for gait using RW, stairs, and transfers, min guard gait without AD  mod I overall except supervision for car transfer and stairs  LUE/LLE NMR, standing balance, activity tolerance, functional mobility, patient education   Communication     na        Safety/Cognition/ Behavioral Observations    no unsafe behaviors        Pain   No c/o pain  <4  Monitor for nonverbal cues of pain   Skin   Rash to R elbow, RUQ, and lower back, resolving. Generalized bruising  No skin breakdown  Assist with turn q 2hrs      *See Care Plan and progress notes for long and short-term goals.  Barriers to Discharge: See above    Possible Resolutions to Barriers:  See above    Discharge Planning/Teaching Needs:  HOme with wife who can provide 24 hr supervision level, needs him to be ambulatory      Team Discussion:  BS issue today-MD working with pt on his home regimen, hemoglobin up to 10.1, which is an improvement. Restarted eliquois.  Doing well in therapies.   Revisions to Treatment Plan:  None  Continued Need for Acute Rehabilitation Level of Care: The patient requires daily medical management by a physician with specialized training in physical medicine and rehabilitation for the following conditions: Daily direction of a multidisciplinary physical rehabilitation program to ensure safe treatment while eliciting the highest outcome that is of practical value to the patient.: Yes Daily medical management of patient stability for increased activity during participation in an intensive rehabilitation regime.: Yes Daily analysis of  laboratory values and/or radiology reports with any subsequent need for medication adjustment of medical intervention for : Neurological problems;Other  Jethro Radke, Gardiner Rhyme 07/06/2014, 2:19 PM

## 2014-07-06 NOTE — Progress Notes (Signed)
Recreational Therapy Session Note  Patient Details  [name] MRN: [id number] Date of Birth: [birth year] Today's Date: 07/06/2014  Informed through team, pt with short LOS.  Met with pt to discuss TR services and use of leisure time post discharge.  Pt is anxious for discharge home and his continued recovery.  No further TR implemented at this time.  Will continue to monitor. Bruning 07/06/2014, 4:44 PM

## 2014-07-06 NOTE — Progress Notes (Signed)
81 year old male with H/o DM type 2, HTN, CABG with bioprosthetic AVR, A fib who has been off coumadin X 2 months due to GIB/colonic AVM. He was admitted to [facility] on 06/27/14 with complaint of left arm numbness and weakness. CT head negative for acute changes. MRI brain done revealing small acute cortical/subcortical infarct right frontal lobe in R-MCA territory, left frontal lobe encephalomalacia question prior trauma, moderate to severe cerebral atrophy and chronic infarcts in cerebellum and thalami. Carotid dopplers done revealing carotid bifurcation and proximal ICA plaque R>L resulting in less than 50% stenosis. 2D echo with normal LVF, mild LVH and normally functioning aortic valve. Neurology consulted and felt that patient with embolic stroke in setting of A fib and recommended initiating Eliquis one week past initial stroke and monitor H/H  Subjective/Complaints: No issues overnite, CBGs reviewed and improving Discussed Hgb, his hematologist is Dr Ma Hillock who prescribes procrit to keep Hgb >10gm Review of Systems - Negative except difficulty with sleeping, up and down during the night  Objective: Vital Signs: Blood pressure 133/46, pulse 60, temperature 98 F (36.7 C), temperature source Oral, resp. rate 18, height 5\' 10"  (1.778 m), weight 104.418 kg (230 lb 3.2 oz), SpO2 99 %. No results found. Results for orders placed or performed during the hospital encounter of 06/29/14 (from the past 72 hour(s))  Glucose, capillary     Status: Abnormal   Collection Time: 07/03/14 11:12 AM  Result Value Ref Range   Glucose-Capillary 202 (H) 70 - 99 mg/dL  Glucose, capillary     Status: Abnormal   Collection Time: 07/03/14  4:29 PM  Result Value Ref Range   Glucose-Capillary 205 (H) 70 - 99 mg/dL  Glucose, capillary     Status: Abnormal   Collection Time: 07/03/14  8:10 PM  Result Value Ref Range   Glucose-Capillary 232 (H) 70 - 99 mg/dL  CBC     Status: Abnormal   Collection Time: 07/04/14   6:15 AM  Result Value Ref Range   WBC 5.9 4.0 - 10.5 K/uL   RBC 3.57 (L) 4.22 - 5.81 MIL/uL   Hemoglobin 9.3 (L) 13.0 - 17.0 g/dL   HCT 29.9 (L) 39.0 - 52.0 %   MCV 83.8 78.0 - 100.0 fL   MCH 26.1 26.0 - 34.0 pg   MCHC 31.1 30.0 - 36.0 g/dL   RDW 13.9 11.5 - 15.5 %   Platelets 271 150 - 400 K/uL  Glucose, capillary     Status: Abnormal   Collection Time: 07/04/14  7:18 AM  Result Value Ref Range   Glucose-Capillary 153 (H) 70 - 99 mg/dL  Glucose, capillary     Status: Abnormal   Collection Time: 07/04/14 11:53 AM  Result Value Ref Range   Glucose-Capillary 243 (H) 70 - 99 mg/dL  Glucose, capillary     Status: Abnormal   Collection Time: 07/04/14  4:43 PM  Result Value Ref Range   Glucose-Capillary 330 (H) 70 - 99 mg/dL  Glucose, capillary     Status: Abnormal   Collection Time: 07/04/14  8:38 PM  Result Value Ref Range   Glucose-Capillary 314 (H) 70 - 99 mg/dL   Comment 1 Notify RN   Glucose, capillary     Status: Abnormal   Collection Time: 07/04/14  9:37 PM  Result Value Ref Range   Glucose-Capillary 307 (H) 70 - 99 mg/dL   Comment 1 Notify RN   Hemoglobin and hematocrit, blood     Status: Abnormal  Collection Time: 07/05/14  5:55 AM  Result Value Ref Range   Hemoglobin 8.9 (L) 13.0 - 17.0 g/dL   HCT 28.3 (L) 39.0 - 52.0 %  Glucose, capillary     Status: Abnormal   Collection Time: 07/05/14  6:51 AM  Result Value Ref Range   Glucose-Capillary 119 (H) 70 - 99 mg/dL   Comment 1 Notify RN   Glucose, capillary     Status: Abnormal   Collection Time: 07/05/14 12:10 PM  Result Value Ref Range   Glucose-Capillary 182 (H) 70 - 99 mg/dL  Glucose, capillary     Status: Abnormal   Collection Time: 07/05/14  4:50 PM  Result Value Ref Range   Glucose-Capillary 143 (H) 70 - 99 mg/dL  Glucose, capillary     Status: Abnormal   Collection Time: 07/05/14  9:08 PM  Result Value Ref Range   Glucose-Capillary 214 (H) 70 - 99 mg/dL  Glucose, capillary     Status: Abnormal    Collection Time: 07/06/14  6:40 AM  Result Value Ref Range   Glucose-Capillary 133 (H) 70 - 99 mg/dL     HEENT: normal Cardio: Irregular and mild systolic murmur Resp: CTA B/L and unlabored GI: BS positive and NT, ND Extremity:  Pulses positive and No Edema Skin:   Intact Neuro: Alert/Oriented, Cranial Nerve II-XII normal, Abnormal Sensory reduced Left hand, Abnormal Motor 2- Wrist and finger ext 3+/5 Left bicep, triceps,delt and Abnormal FMC Ataxic/ dec FMC Musc/Skel:  Other OA PIP, DIPs Gen NAD   Assessment/Plan: 1. Functional deficits secondary to Right MCA distribution infarct with LUE> LE weakness which require 3+ hours per day of interdisciplinary therapy in a comprehensive inpatient rehab setting. Physiatrist is providing close team supervision and 24 hour management of active medical problems listed below. Physiatrist and rehab team continue to assess barriers to discharge/monitor patient progress toward functional and medical goals. FIM: FIM - Bathing Bathing Steps Patient Completed: Chest, Right Arm, Left Arm, Abdomen, Front perineal area, Buttocks, Right upper leg, Left upper leg, Right lower leg (including foot), Left lower leg (including foot) Bathing: 4: Steadying assist  FIM - Upper Body Dressing/Undressing Upper body dressing/undressing steps patient completed: Thread/unthread right sleeve of pullover shirt/dresss, Thread/unthread left sleeve of pullover shirt/dress, Put head through opening of pull over shirt/dress, Pull shirt over trunk, Thread/unthread right sleeve of front closure shirt/dress, Pull shirt around back of front closure shirt/dress, Thread/unthread left sleeve of front closure shirt/dress Upper body dressing/undressing: 4: Min-Patient completed 75 plus % of tasks FIM - Lower Body Dressing/Undressing Lower body dressing/undressing steps patient completed: Thread/unthread left underwear leg, Thread/unthread left pants leg, Thread/unthread right pants leg,  Pull pants up/down Lower body dressing/undressing: 2: Max-Patient completed 25-49% of tasks  FIM - Toileting Toileting steps completed by patient: Performs perineal hygiene, Adjust clothing prior to toileting Toileting Assistive Devices: Grab bar or rail for support Toileting: 3: Mod-Patient completed 2 of 3 steps  FIM - Radio producer Devices: Environmental consultant, Product manager Transfers: 5-To toilet/BSC: Supervision (verbal cues/safety issues), 5-From toilet/BSC: Supervision (verbal cues/safety issues)  FIM - Control and instrumentation engineer Devices: Copy: 5: Bed > Chair or W/C: Supervision (verbal cues/safety issues), 5: Chair or W/C > Bed: Supervision (verbal cues/safety issues)  FIM - Locomotion: Wheelchair Distance: 100 Locomotion: Wheelchair: 0: Activity did not occur FIM - Locomotion: Ambulation Locomotion: Ambulation Assistive Devices: Administrator Ambulation/Gait Assistance: 5: Supervision Locomotion: Ambulation: 5: Travels 150 ft or more with supervision/safety issues  Comprehension Comprehension Mode: Auditory Comprehension: 6-Follows complex conversation/direction: With extra time/assistive device  Expression Expression Mode: Verbal Expression: 7-Expresses complex ideas: With no assist  Social Interaction Social Interaction: 6-Interacts appropriately with others with medication or extra time (anti-anxiety, antidepressant).  Problem Solving Problem Solving: 5-Solves basic 90% of the time/requires cueing < 10% of the time  Memory Memory: 5-Recognizes or recalls 90% of the time/requires cueing < 10% of the time  Medical Problem List and Plan: 1. Functional deficits secondary to Right frontal infarct with left hemiparesis 2. DVT Prophylaxis/Anticoagulation: Pharmaceutical:d/c  Lovenox, restarted Eliquis 1/25 3. Pain Management: Tylenol  4. Mood: Monitor for lability and post CVA mood d/o 5. Neuropsych:  This patient is capable of making decisions on his own behalf. 6. Skin/Wound Care: Routine pressure relief measures. Maintain adequate nutritional and hydration status. Rehab RN to monitor skin daily. 7. Fluids/Electrolytes/Nutrition: increased lantus to 50 units---may need further tiration   -adjusted SSI to patient's preference  -  -follow today for pattern 9. CKD: Baseline Cr-1.8. Will check labs in am to monitor stability.  10. CAD s/p AVR: On lasix, coreg, norvasc and lipitor.  11. Colonic AVM with GIB: checking stool guaiacs. No gross hematochezia To start eliquis since  H/H stable. Daily H & H -12. A fib: Will monitor heart rate every 8 hours. Continue coreg bid. On Eliquis LOS (Days) 7 A FACE TO FACE EVALUATION WAS PERFORMED  [name] 07/06/2014, 7:43 AM

## 2014-07-06 NOTE — Progress Notes (Signed)
Physical Therapy Session Note  Patient Details  [name] MRN: [id number] Date of Birth: 09-15-1927  Today's Date: 07/06/2014 PT Individual Time: 7622-6333 PT Individual Time Calculation (min): 30 min   Short Term Goals: Week 1: 1. Pt will perform dynamic standing activity x 3-5 minutes with supervision. 2. Pt will ambulate x 100' with RW with supervision. 3. Pt will perform stand pivot transfer with LRAD with supervision, with mod cues for use of LUE 4. Pt will negotiate 5 steps using 1 hand rail with supervision.    Skilled Therapeutic Interventions/Progress Updates:  Gait with RW in room to/from room for oral hygiene and tuck in shirt in standing; supervision for gait. Pt unbuttoned his pants with extra time and encouragement.   Assist needed to button pants.  Gait x 150' x 2 with supervision; 1 LOB, mild, to L, and regained balance independently.  Standing dynamic balance activity: reaching within BOS to L high and low and reaching and retrieving playing cards using L hand pincer grip, x 20.  Pt performed with close supervision, RUE light support on table in front.Pt very reluctant to reach out of BOS in any direction.  Seated L hand activity sorting playing cards by suit using R hand to hold deck, and L hand to pick out 1 card at a time from bottom of deck; 100% successful with extra time, x 20 cards.  Pt reported that he and his wife have played bridge x 60 years.  Pt left seated in recliner with all needs in place at end of session. Pt issued a deck of cards to keep in room.    Therapy Documentation Precautions:  Precautions Precautions: Fall Precaution Comments: L hemi paresis Restrictions Weight Bearing Restrictions: No Pain: Pain Assessment Pain Assessment: No/denies pain   Locomotion : Ambulation Ambulation/Gait Assistance: 5: Supervision    See FIM for current functional status  Therapy/Group: Individual Therapy  Jeanne Terrance 07/06/2014, 11:14 AM

## 2014-07-06 NOTE — Progress Notes (Signed)
Physical Therapy Session Note  Patient Details  [name] MRN: [id number] Date of Birth: [birth year]  Today's Date: 07/06/2014 PT Individual Time: 1300-1400 PT Individual Time Calculation (min): 60 min   Short Term Goals: Week 1:  PT Short Term Goal 1 (Week 1): Pt will perform dynamic standing 3-5 mins at S level PT Short Term Goal 2 (Week 1): Pt will ambulate x 100' with RW at S level  PT Short Term Goal 3 (Week 1): Pt will perform stand pivot transfers with LRAD at S level with mod cuing for use of LUE. PT Short Term Goal 4 (Week 1): Pt will negotiate 5 steps with single handrail at S level  Skilled Therapeutic Interventions/Progress Updates:   Pt received sitting in recliner in room, agreeable to therapy session.  Pt ambulated to/from therapy gym using RW at distant S level with min cues for upright posture and safety when turning with RW.  Once in therapy gym, discussed entry to home and stairs to ascend upstairs to bedroom/bathroom.  Pt states he has handrail on the L with single landing in which he can rest.  Performed 14, 6" steps at S level today.  Min cues for stepping sequence and safety.  Pt continues to require seated rest break following all tasks during session, esp stair negotiation.  Discussed safety and energy conservation at home.  Pt then worked on high level balance with yoga block kick to work on [employer], weight shifting and dual tasking.  Requires light min A during task with two rest breaks.  Progressed to ambulation with ball task for bimanual task.  Again requires min to mod A with cues for increasing utilization of LUE during task.  Performed horse shoe toss while on airdex pad with LUE at min/guard level.  Pt requires max encouragement to perform task without use of RW.  Ended session with seated nustep x 8 mins at level 4-5 resistance with BUE/LEs to increase overall strengthening and endurance.  Pt ambulated back to room as stated above and left in recliner with all  needs in reach.    Therapy Documentation Precautions:  Precautions Precautions: Fall Precaution Comments: L hemi paresis Restrictions Weight Bearing Restrictions: No   Vital Signs: Therapy Vitals Temp: 97.5 F (36.4 C) Temp Source: Oral Pulse Rate: 68 Resp: 17 BP: (!) 149/62 mmHg Patient Position (if appropriate): Sitting Oxygen Therapy SpO2: 98 % O2 Device: Not Delivered Pain: Pt with pain in R knee during bending tasks, therefore modified tasks.     See FIM for current functional status  Therapy/Group: Individual Therapy  Denice Bors 07/06/2014, 4:59 PM

## 2014-07-06 NOTE — Progress Notes (Signed)
Physical Therapy Session Note  Patient Details  [name] MRN: [id number] Date of Birth: 06/08/1928  Today's Date: 07/06/2014 PT Individual Time: 9977-4142 PT Individual Time Calculation (min): 30 min   Short Term Goals: Week 1:  PT Short Term Goal 1 (Week 1): Pt will perform dynamic standing 3-5 mins at S level PT Short Term Goal 2 (Week 1): Pt will ambulate x 100' with RW at S level  PT Short Term Goal 3 (Week 1): Pt will perform stand pivot transfers with LRAD at S level with mod cuing for use of LUE. PT Short Term Goal 4 (Week 1): Pt will negotiate 5 steps with single handrail at S level  Skilled Therapeutic Interventions/Progress Updates:    Pt received seated in reclined; asleep but easily awakened. Pt performed functional ambulation x150 in controlled environment without assistive device with min A, intermittent L HHA (due to pt fear of falling); verbal, tactile cueing for increased lateral weight shift to L side, reciprocal arm swing. Upon arriving at treatment gym, pt reporting perception of low blood glucose. RN immediately notified and NT present soon thereafter to assess CBG, which was 52. Therefore, RN provided snack and reassessed CBG upon return to room. While pt eating diabetic snack, multimodal cueing provided for forced use of LUE for opening packages, eating. Returned to room via gait x150' total in controlled environment with min A, L HHA and standing rest breaks x2 (<30 seconds in duration) due to fatigue and pt-perceived weakness. Departed with pt seated in recliner with all needs within reach.   Therapy Documentation Precautions:  Precautions Precautions: Fall Precaution Comments: L hemi paresis Restrictions Weight Bearing Restrictions: No Pain: Pain Assessment Pain Assessment: No/denies pain Locomotion : Ambulation Ambulation/Gait Assistance: 4: Min assist;4: Min guard   See FIM for current functional status  Therapy/Group: Individual  Therapy  Hobble, Malva Cogan 07/06/2014, 12:52 PM

## 2014-07-06 NOTE — Progress Notes (Signed)
Social Work Patient ID: [name], male   DOB: 1927/09/14, 81 y.o.   MRN: [id number] Met with pt to inform of team conference progress toward his goals-mod/i level and discharge date 1/30. He is feeling good about his leg but would like his hand to be doing the same. He has made good progress with his hand and can do more than he was.  He prefers [facility] and [facility] he had before.  Will make referral for PT & OT.  Discussed having a walker at the  [place] [facility] and downstairs, he will think about this.  He is agreeable with discharge date.

## 2014-07-06 NOTE — Progress Notes (Signed)
Pt BS was 326. Notified Algis Liming, PA. Administered 9 units of Novolog. Will monitor. Kennieth Francois, RN

## 2014-07-06 NOTE — Progress Notes (Signed)
Hypoglycemic Event  CBG: 70  Treatment: pt ate lunch   Symptoms: None  Follow-up CBG: Time:1253 CBG Result:183  Possible Reasons for Event: Unknown  Comments/MD notified: Algis Liming, PA notified. Told to continue to hold sliding scale coverage.     [place] A  Remember to initiate Hypoglycemia Order Set & complete

## 2014-07-06 NOTE — Progress Notes (Signed)
Social Work Elease Hashimoto, LCSW Social Worker Signed  Patient Care Conference 07/06/2014  2:19 PM    Expand All Collapse All   Inpatient RehabilitationTeam Conference and Plan of Care Update Date: 07/06/2014   Time: 12;00 PM     Patient [name]       Medical Record Number: [id number]  Date of Birth: 09/19/1927 Sex: Male         Room/Bed: 4M11C/4M11C-01 Payor Info: Payor: MEDICARE / Plan: MEDICARE PART A AND B / Product Type: *No Product type* /    Admitting Diagnosis: R MCA CVA   Admit Date/Time:  06/29/2014 11:40 AM Admission Comments: No comment available   Primary Diagnosis:  Acute left hemiparesis Principal Problem: Acute left hemiparesis    Patient Active Problem List     Diagnosis  Date Noted   .  Type 2 diabetes, controlled, with peripheral neuropathy  07/06/2014   .  HTN (hypertension)  06/30/2014   .  Diabetes  06/30/2014   .  CVA (cerebral infarction)  06/29/2014   .  Acute left hemiparesis  06/29/2014   .  Anemia  11/10/2013   .  Atrial fibrillation  05/12/2013   .  Mobitz type 1 second degree atrioventricular block  10/01/2012   .  S/P AVR  03/24/2012   .  Aortic stenosis, severe     .  Hypertension     .  Junctional bradycardia     .  Hyperlipidemia     .  Gastrointestinal bleed     .  CAD (coronary artery disease)       Expected Discharge Date: Expected Discharge Date: 07/09/14  Team Members Present: Physician leading conference: Dr. Alysia Penna Social Worker Present: Ovidio Kin, LCSW Nurse Present: Heather Roberts, RN PT Present: Raylene Everts, PT;Tibor Lemmons Varner, Jimmie Molly, PT OT Present: Clyda Greener, Rhetta Mura, OT SLP Present: Gunnar Fusi, SLP PPS Coordinator present : Daiva Nakayama, RN, CRRN        Current Status/Progress  Goal  Weekly Team Focus   Medical     Acute on chronic anemia, Anticoagulation was restarted,, chronic GI bleed  Maintain medical stability for home discharge   Monitor hemoglobin treat anemia    Bowel/Bladder     Continent of bowel and bladder. LBM 07/04/14. Urinary urgency, wears pad in brief  Pt to remain continent of bowel and baldder   Monitor   Swallow/Nutrition/ Hydration       na         ADL's     steady assist for dynamic standing in shower and LB dressing, UB dressing with supervision, toileting supervision, toilet transfer with grab bar with supervision, shower transfer with supervision   Mod I - supervision  dynamic standing balance, L UE NMR, functional ambulation, functional transfers, pt and family education   Mobility     supervision up to 150 ft for gait using RW, stairs, and transfers, min guard gait without AD  mod I overall except supervision for car transfer and stairs   LUE/LLE NMR, standing balance, activity tolerance, functional mobility, patient education   Communication       na         Safety/Cognition/ Behavioral Observations      no unsafe behaviors         Pain     No c/o pain  <4  Monitor for nonverbal cues of pain    Skin     Rash to R elbow, RUQ,  and lower back, resolving. Generalized bruising  No skin breakdown  Assist with turn q 2hrs      *See Care Plan and progress notes for long and short-term goals.    Barriers to Discharge:  See above     Possible Resolutions to Barriers:   See above     Discharge Planning/Teaching Needs:   HOme with wife who can provide 24 hr supervision level, needs him to be ambulatory       Team Discussion:    BS issue today-MD working with pt on his home regimen, hemoglobin up to 10.1, which is an improvement. Restarted eliquois.  Doing well in therapies.    Revisions to Treatment Plan:    None    Continued Need for Acute Rehabilitation Level of Care: The patient requires daily medical management by a physician with specialized training in physical medicine and rehabilitation for the following conditions: Daily direction of a multidisciplinary physical rehabilitation program to ensure safe treatment while  eliciting the highest outcome that is of practical value to the patient.: Yes Daily medical management of patient stability for increased activity during participation in an intensive rehabilitation regime.: Yes Daily analysis of laboratory values and/or radiology reports with any subsequent need for medication adjustment of medical intervention for : Neurological problems;Other  Elease Hashimoto 07/06/2014, 2:19 PM                  Patient ID: [name], male   DOB: 01/31/1928, 80 y.o.   MRN: [id number]

## 2014-07-07 ENCOUNTER — Inpatient Hospital Stay (HOSPITAL_COMMUNITY): Payer: Medicare Other | Admitting: Rehabilitation

## 2014-07-07 ENCOUNTER — Inpatient Hospital Stay (HOSPITAL_COMMUNITY): Payer: Medicare Other | Admitting: Occupational Therapy

## 2014-07-07 ENCOUNTER — Inpatient Hospital Stay (HOSPITAL_COMMUNITY): Payer: Medicare Other

## 2014-07-07 DIAGNOSIS — D649 Anemia, unspecified: Secondary | ICD-10-CM

## 2014-07-07 LAB — HEMOGLOBIN AND HEMATOCRIT, BLOOD
HCT: 32 % — ABNORMAL LOW (ref 39.0–52.0)
Hemoglobin: 9.9 g/dL — ABNORMAL LOW (ref 13.0–17.0)

## 2014-07-07 LAB — GLUCOSE, CAPILLARY
GLUCOSE-CAPILLARY: 229 mg/dL — AB (ref 70–99)
GLUCOSE-CAPILLARY: 222 mg/dL — AB (ref 70–99)
Glucose-Capillary: 94 mg/dL (ref 70–99)
Glucose-Capillary: 279 mg/dL — ABNORMAL HIGH (ref 70–99)

## 2014-07-07 NOTE — Progress Notes (Signed)
Physical Therapy Session Note  Patient Details  [name] MRN: [id number] Date of Birth: 20-Jun-1927  Today's Date: 07/07/2014 PT Individual Time: 1100-1200 PT Individual Time Calculation (min): 60 min   Short Term Goals: Week 1:  PT Short Term Goal 1 (Week 1): Pt will perform dynamic standing 3-5 mins at S level PT Short Term Goal 2 (Week 1): Pt will ambulate x 100' with RW at S level  PT Short Term Goal 3 (Week 1): Pt will perform stand pivot transfers with LRAD at S level with mod cuing for use of LUE. PT Short Term Goal 4 (Week 1): Pt will negotiate 5 steps with single handrail at S level  Skilled Therapeutic Interventions/Progress Updates:   Pt received sitting in recliner in room, agreeable to therapy session.  Skilled session focused on high level gait and balance tasks.  Worked on gait to/from therapy gym without AD in order to challenge balance and increase weight shift to the L.  Pt very resistant initially regarding walking without walker, but with increased encouragement, agreeable.  Requires seated rest break prior to getting all the way to the therapy gym.  Discussed D/C plan and stairs at home.  Feel he would benefit best from having two RW's in order to keep one upstairs and one downstairs for safety.  Pt states that he has a standard RW that he can use upstairs, but PT still feels he will be safe with use of RW both upstairs and downstairs.  Once in therapy gym, worked on Dietitian tasks while on [employer] in // bars forwards and sideways with and without UE support with head turns vertically and horizontally.  Note that he continued to be resistant to letting go with hands, however did well and only required light min A throughout tasks.  Pt demonstrates good ankle strategy, however relies heavily on UEs when losing balance.  Then performed dynamic task standing on airdex pad while placing resistive clothes pins with LUE on tall metal pole.  Pt able to place all  colors with LUE except for black (most resistive).  Then worked on seated task with LUE, placing large bead on shoe string.  Again, pt easily frustrated, but did better with encouragement.  Ended session with having pt ambulate back to room while carrying cup of ice water in L hand.  Pt able to carry object and ambulate at min A level.  While seated, pt performed seated LAQ's x 10 reps with cues for upright posture and increased knee extension.  Also performed sit<>stand with LUE assist only at S level x 5 reps.  Pt left in recliner and discussed making pt mod I in room tomorrow.  Pt pleased with this.  All needs in reach.   Therapy Documentation Precautions:  Precautions Precautions: Fall Precaution Comments: L hemi paresis Restrictions Weight Bearing Restrictions: No   Vital Signs: Therapy Vitals Pulse Rate: 62 BP: (!) 160/72 mmHg Pain: Pt with no c/o pain during session.    See FIM for current functional status  Therapy/Group: Individual Therapy  Denice Bors 07/07/2014, 12:15 PM

## 2014-07-07 NOTE — Progress Notes (Signed)
81 year old male with H/o DM type 2, HTN, CABG with bioprosthetic AVR, A fib who has been off coumadin X 2 months due to GIB/colonic AVM. He was admitted to [facility] on 06/27/14 with complaint of left arm numbness and weakness. CT head negative for acute changes. MRI brain done revealing small acute cortical/subcortical infarct right frontal lobe in R-MCA territory, left frontal lobe encephalomalacia question prior trauma, moderate to severe cerebral atrophy and chronic infarcts in cerebellum and thalami. C  Subjective/Complaints: No issues overnite, CBGs reviewed and improving Discussed Hgb with pt's son, cardiologist  Dr Acie Fredrickson, as well as pt Review of Systems - Negative except difficulty with sleeping, up and down during the night  Objective: Vital Signs: Blood pressure 123/37, pulse 62, temperature 98.3 F (36.8 C), temperature source Oral, resp. rate 18, height _0  (1.778 m), weight 105.053 kg (231 lb 9.6 oz), SpO2 96 %. No results found. Results for orders placed or performed during the hospital encounter of 06/29/14 (from the past 72 hour(s))  Glucose, capillary     Status: Abnormal   Collection Time: 07/04/14 11:53 AM  Result Value Ref Range   Glucose-Capillary 243 (H) 70 - 99 mg/dL  Glucose, capillary     Status: Abnormal   Collection Time: 07/04/14  4:43 PM  Result Value Ref Range   Glucose-Capillary 330 (H) 70 - 99 mg/dL  Glucose, capillary     Status: Abnormal   Collection Time: 07/04/14  8:38 PM  Result Value Ref Range   Glucose-Capillary 314 (H) 70 - 99 mg/dL   Comment 1 Notify RN   Glucose, capillary     Status: Abnormal   Collection Time: 07/04/14  9:37 PM  Result Value Ref Range   Glucose-Capillary 307 (H) 70 - 99 mg/dL   Comment 1 Notify RN   Hemoglobin and hematocrit, blood     Status: Abnormal   Collection Time: 07/05/14  5:55 AM  Result Value Ref Range   Hemoglobin 8.9 (L) 13.0 - 17.0 g/dL   HCT 28.3 (L) 39.0 - 52.0 %  Glucose, capillary     Status: Abnormal    Collection Time: 07/05/14  6:51 AM  Result Value Ref Range   Glucose-Capillary 119 (H) 70 - 99 mg/dL   Comment 1 Notify RN   Glucose, capillary     Status: Abnormal   Collection Time: 07/05/14 12:10 PM  Result Value Ref Range   Glucose-Capillary 182 (H) 70 - 99 mg/dL  Glucose, capillary     Status: Abnormal   Collection Time: 07/05/14  4:50 PM  Result Value Ref Range   Glucose-Capillary 143 (H) 70 - 99 mg/dL  Glucose, capillary     Status: Abnormal   Collection Time: 07/05/14  9:08 PM  Result Value Ref Range   Glucose-Capillary 214 (H) 70 - 99 mg/dL  Glucose, capillary     Status: Abnormal   Collection Time: 07/06/14  6:40 AM  Result Value Ref Range   Glucose-Capillary 133 (H) 70 - 99 mg/dL  Hemoglobin and hematocrit, blood     Status: Abnormal   Collection Time: 07/06/14  7:55 AM  Result Value Ref Range   Hemoglobin 10.1 (L) 13.0 - 17.0 g/dL   HCT 32.6 (L) 39.0 - 02.5 %  Basic metabolic panel     Status: Abnormal   Collection Time: 07/06/14  7:55 AM  Result Value Ref Range   Sodium 137 135 - 145 mmol/L   Potassium 4.3 3.5 - 5.1 mmol/L   Chloride  101 96 - 112 mmol/L   CO2 26 19 - 32 mmol/L   Glucose, Bld 113 (H) 70 - 99 mg/dL   BUN 41 (H) 6 - 23 mg/dL   Creatinine, Ser 1.61 (H) 0.50 - 1.35 mg/dL   Calcium 9.3 8.4 - 10.5 mg/dL   GFR calc non Af Amer 38 (L) >90 mL/min   GFR calc Af Amer 44 (L) >90 mL/min    Comment: (NOTE) The eGFR has been calculated using the CKD EPI equation. This calculation has not been validated in all clinical situations. eGFR's persistently <90 mL/min signify possible Chronic Kidney Disease.    Anion gap 10 5 - 15  Glucose, capillary     Status: Abnormal   Collection Time: 07/06/14 11:32 AM  Result Value Ref Range   Glucose-Capillary 52 (L) 70 - 99 mg/dL   Comment 1 Notify RN   Glucose, capillary     Status: None   Collection Time: 07/06/14 11:53 AM  Result Value Ref Range   Glucose-Capillary 70 70 - 99 mg/dL  Glucose, capillary      Status: Abnormal   Collection Time: 07/06/14 12:53 PM  Result Value Ref Range   Glucose-Capillary 183 (H) 70 - 99 mg/dL   Comment 1 Notify RN   Glucose, capillary     Status: Abnormal   Collection Time: 07/06/14  4:57 PM  Result Value Ref Range   Glucose-Capillary 326 (H) 70 - 99 mg/dL   Comment 1 Notify RN   Glucose, capillary     Status: Abnormal   Collection Time: 07/06/14  9:10 PM  Result Value Ref Range   Glucose-Capillary 224 (H) 70 - 99 mg/dL  Glucose, capillary     Status: None   Collection Time: 07/07/14  6:54 AM  Result Value Ref Range   Glucose-Capillary 94 70 - 99 mg/dL     HEENT: normal Cardio: Irregular and mild systolic murmur Resp: CTA B/L and unlabored GI: BS positive and NT, ND Extremity:  Pulses positive and No Edema Skin:   Intact Neuro: Alert/Oriented, Cranial Nerve II-XII normal, Abnormal Sensory reduced Left hand, Abnormal Motor 2- Wrist and finger ext 3+/5 Left bicep, triceps,delt and Abnormal FMC Ataxic/ dec FMC Musc/Skel:  Other OA PIP, DIPs Gen NAD   Assessment/Plan: 1. Functional deficits secondary to Right MCA distribution infarct with LUE> LE weakness which require 3+ hours per day of interdisciplinary therapy in a comprehensive inpatient rehab setting. Physiatrist is providing close team supervision and 24 hour management of active medical problems listed below. Physiatrist and rehab team continue to assess barriers to discharge/monitor patient progress toward functional and medical goals. FIM: FIM - Bathing Bathing Steps Patient Completed: Chest, Right Arm, Left Arm, Abdomen, Front perineal area, Buttocks, Right upper leg, Left upper leg, Right lower leg (including foot), Left lower leg (including foot) Bathing: 4: Steadying assist  FIM - Upper Body Dressing/Undressing Upper body dressing/undressing steps patient completed: Thread/unthread right sleeve of pullover shirt/dresss, Thread/unthread left sleeve of pullover shirt/dress, Put head  through opening of pull over shirt/dress, Pull shirt over trunk Upper body dressing/undressing: 5: Supervision: Safety issues/verbal cues FIM - Lower Body Dressing/Undressing Lower body dressing/undressing steps patient completed: Thread/unthread left underwear leg, Thread/unthread left pants leg, Thread/unthread right underwear leg, Pull underwear up/down, Thread/unthread right pants leg, Pull pants up/down, Fasten/unfasten pants Lower body dressing/undressing: 4: Steadying Assist  FIM - Toileting Toileting steps completed by patient: Adjust clothing prior to toileting, Performs perineal hygiene, Adjust clothing after toileting Toileting Assistive Devices: Grab  bar or rail for support Toileting: 5: Supervision: Safety issues/verbal cues  FIM - Radio producer Devices: Insurance account manager Transfers: 5-To toilet/BSC: Supervision (verbal cues/safety issues), 5-From toilet/BSC: Supervision (verbal cues/safety issues)  FIM - Control and instrumentation engineer Devices: Copy: 5: Bed > Chair or W/C: Supervision (verbal cues/safety issues)  FIM - Locomotion: Wheelchair Distance: 100 Locomotion: Wheelchair: 0: Activity did not occur FIM - Locomotion: Ambulation Locomotion: Ambulation Assistive Devices: Other (comment) (No AD then L HHA) Ambulation/Gait Assistance: 4: Min assist, 4: Min guard Locomotion: Ambulation: 4: Travels 150 ft or more with minimal assistance (Pt.>75%)  Comprehension Comprehension Mode: Auditory Comprehension: 6-Follows complex conversation/direction: With extra time/assistive device  Expression Expression Mode: Verbal Expression: 6-Expresses complex ideas: With extra time/assistive device  Social Interaction Social Interaction: 6-Interacts appropriately with others with medication or extra time (anti-anxiety, antidepressant).  Problem Solving Problem Solving: 5-Solves complex 90% of the time/cues < 10% of the  time  Memory Memory: 5-Recognizes or recalls 90% of the time/requires cueing < 10% of the time  Medical Problem List and Plan: 1. Functional deficits secondary to Right frontal infarct with left hemiparesis 2. DVT Prophylaxis/Anticoagulation: Pharmaceutical:d/c  Lovenox, restarted Eliquis 1/25 3. Pain Management: Tylenol  4. Mood: Monitor for lability and post CVA mood d/o 5. Neuropsych: This patient is capable of making decisions on his own behalf. 6. Skin/Wound Care: Routine pressure relief measures. Maintain adequate nutritional and hydration status. Rehab RN to monitor skin daily. 7. Fluids/Electrolytes/Nutrition: increased lantus to 50 units---may need further tiration   -adjusted SSI to patient's preference  -  -follow today for pattern 9. CKD: Baseline Cr-1.8. Will check labs in am to monitor stability.  10. CAD s/p AVR: On lasix, coreg, norvasc and lipitor.  11. Colonic AVM with GIB: checking stool guaiacs. No gross hematochezia To start eliquis since  H/H stable. Daily H & H -12. A fib: Will monitor heart rate every 8 hours. Continue coreg bid. On Eliquis LOS (Days) 8 A FACE TO FACE EVALUATION WAS PERFORMED  Teiana Hajduk E 07/07/2014, 8:09 AM

## 2014-07-07 NOTE — Progress Notes (Signed)
Physical Therapy Session Note  Patient Details  [name] MRN: [id number] Date of Birth: [birth year]  Today's Date: 07/07/2014 PT Individual Time: 1400-1500 PT Individual Time Calculation (min): 60 min   Short Term Goals: Week 1:  PT Short Term Goal 1 (Week 1): Pt will perform dynamic standing 3-5 mins at S level PT Short Term Goal 2 (Week 1): Pt will ambulate x 100' with RW at S level  PT Short Term Goal 3 (Week 1): Pt will perform stand pivot transfers with LRAD at S level with mod cuing for use of LUE. PT Short Term Goal 4 (Week 1): Pt will negotiate 5 steps with single handrail at S level  Skilled Therapeutic Interventions/Progress Updates:  Patient sitting in recliner upon entering room. Patient's shoes changed to elastic shoe laces for ease in donning and doffing. Patient practiced donning socks with sock aid and performed with min assist. Patient to bathroom with supervision for toilet transfer and toileting tasks except for min assist with fasteners on pants. Patient stood at sink to wash hands. Patient ambulated with rolling walker 300+ feet in community setting on and off elevators and through gift shop with distant supervision. Patient ambulate up and down 10 steps with 1 rail simulating home setting with supervision. Patient left in recliner with all items in reach.  Therapy Documentation Precautions:  Precautions Precautions: Fall Precaution Comments: L hemi paresis Restrictions Weight Bearing Restrictions: No General:   Pain: Pain Assessment Pain Assessment: No/denies pain  See FIM for current functional status  Therapy/Group: Individual Therapy  Sanjuana Letters 07/07/2014, 3:54 PM

## 2014-07-07 NOTE — Progress Notes (Signed)
Occupational Therapy Session Note  Patient Details  [name] MRN: [id number] Date of Birth: 28-Mar-1928  Today's Date: 07/07/2014 OT Individual Time: 5427-0623 OT Individual Time Calculation (min): 60 min    Short Term Goals: Week 1:  OT Short Term Goal 1 (Week 1): Pt will utilize B UEs in self care tasks with min verbal cues to incorporate into functional activity. OT Short Term Goal 2 (Week 1): Pt will perform bathing with Min A in order to increase I in self care.  OT Short Term Goal 3 (Week 1): Pt will perform toilet transfer with Min A in order to increase I in functional transfers. OT Short Term Goal 4 (Week 1): Pt will perform LB dressing with Min A in order to increase I with self care tasks.  Skilled Therapeutic Interventions/Progress Updates:  Upon entering the room, pt receiving medications from RN. Pt with no c/o pain this session. Skilled OT session with focus on self care retraining, dynamic standing balance, functional mobility, and functional transfers. Pt performed STS from bed with supervsion. Ambulation with use of RW into bathroom for shower with supervision as well as for transfer onto shower chair. Pt completed bathing at shower level with use of LH sponge to increase independence. Pt performed toileting prior to dressing with supervision as well. Dressing completed seated on EOB with supervision for all tasks completed. Pt able to fasten pants this session as well while standing with supervision. Pt utilizing sock aide to donn L sock with supervision as pt utilizes this AE prior to admission for dressing. R sock and B shoes not donned as pt seated in recliner chair awaiting RN for vinegar foot soak. Call bell and all needed items within reach upon exiting the room.   Therapy Documentation Precautions:  Precautions Precautions: Fall Precaution Comments: L hemi paresis Restrictions Weight Bearing Restrictions: No Vital Signs: Therapy Vitals Pulse Rate:  62 BP: (!) 160/72 mmHg  See FIM for current functional status  Therapy/Group: Individual Therapy  [name] 07/07/2014, 12:26 PM

## 2014-07-08 ENCOUNTER — Inpatient Hospital Stay (HOSPITAL_COMMUNITY): Payer: Medicare Other | Admitting: Occupational Therapy

## 2014-07-08 ENCOUNTER — Inpatient Hospital Stay (HOSPITAL_COMMUNITY): Payer: Medicare Other | Admitting: Rehabilitation

## 2014-07-08 ENCOUNTER — Encounter (HOSPITAL_COMMUNITY): Payer: Medicare Other | Admitting: Occupational Therapy

## 2014-07-08 LAB — GLUCOSE, CAPILLARY
GLUCOSE-CAPILLARY: 235 mg/dL — AB (ref 70–99)
GLUCOSE-CAPILLARY: 216 mg/dL — AB (ref 70–99)
Glucose-Capillary: 152 mg/dL — ABNORMAL HIGH (ref 70–99)
Glucose-Capillary: 158 mg/dL — ABNORMAL HIGH (ref 70–99)

## 2014-07-08 LAB — HEMOGLOBIN AND HEMATOCRIT, BLOOD
HCT: 29.5 % — ABNORMAL LOW (ref 39.0–52.0)
Hemoglobin: 9.3 g/dL — ABNORMAL LOW (ref 13.0–17.0)

## 2014-07-08 MED ORDER — NITROGLYCERIN 0.4 MG/HR TD PT24
0.4000 mg | MEDICATED_PATCH | Freq: Every day | TRANSDERMAL | Status: DC
  Administered 2014-07-09: 0.4 mg via TRANSDERMAL
  Filled 2014-07-08 (×3): qty 1

## 2014-07-08 MED ORDER — POLYSACCHARIDE IRON COMPLEX 150 MG PO CAPS: 150.0000 mg | ORAL_CAPSULE | Freq: Two times a day (BID) | ORAL | Status: DC

## 2014-07-08 MED ORDER — NITROGLYCERIN 0.4 MG/HR TD PT24: 0.4000 mg | MEDICATED_PATCH | Freq: Every day | TRANSDERMAL | Status: DC

## 2014-07-08 MED ORDER — TAMSULOSIN HCL 0.4 MG PO CAPS: 0.4000 mg | ORAL_CAPSULE | Freq: Every day | ORAL | Status: DC

## 2014-07-08 MED ORDER — ATORVASTATIN CALCIUM 40 MG PO TABS: 40.0000 mg | ORAL_TABLET | Freq: Every day | ORAL | Status: DC

## 2014-07-08 MED ORDER — LOSARTAN POTASSIUM 25 MG PO TABS: 25.0000 mg | ORAL_TABLET | Freq: Every day | ORAL | Status: DC

## 2014-07-08 MED ORDER — AMLODIPINE BESYLATE 5 MG PO TABS: 5.0000 mg | ORAL_TABLET | Freq: Every day | ORAL | Status: DC

## 2014-07-08 MED ORDER — APIXABAN 2.5 MG PO TABS: 2.5000 mg | ORAL_TABLET | Freq: Two times a day (BID) | ORAL | Status: DC

## 2014-07-08 MED ORDER — INSULIN GLARGINE 100 UNIT/ML ~~LOC~~ SOLN: 50.0000 [IU] | Freq: Every day | SUBCUTANEOUS | Status: DC

## 2014-07-08 NOTE — Progress Notes (Signed)
Social Work Patient ID: [name], male   DOB: 08-Oct-1927, 81 y.o.   MRN: [id number] Met with pt who feels prepared for discharge tomorrow.  He has all of his equipment and feels his standard walker will work at home along with the rollator walker he has.  He is pleased with the progress in his hand but wished if was completely recovered.  He is aware [facility] to contact at home to schedule home health appointments.  He is ready for discharge tomorrow.

## 2014-07-08 NOTE — Progress Notes (Signed)
Occupational Therapy Discharge Summary and OT interventions  Patient Details  [name] MRN: [id number] Date of Birth: 25-Jun-1927  Today's Date: 07/08/2014 OT Individual Time: 1100-1200 [id number] OT Individual Time Calculation (min): 60 min and 60 min   Patient has met 10 of 10 long term goals due to improved activity tolerance, improved balance, postural control, ability to compensate for deficits, functional use of  LEFT upper extremity and improved coordination.  Patient to discharge at overall Mod I - supervision level. Patient;s care partner did not formally participate in family training but pt overall Mod I with supervision for shower transfer, meal prep, and laundry.   Reasons goals not met: all goals met  Recommendation:  Patient will benefit from ongoing skilled OT services in home health setting to continue to advance functional skills in the area of BADL and iADL.  Equipment: No equipment provided  Reasons for discharge: treatment goals met  Patient/family agrees with progress made and goals achieved: Yes   OT Intervention Sessions: Session 1: Upon entering the room, pt seated in recliner chair awaiting therapist. Pt with no c/o pain this session. Pt ambulating with RW and Mod I to obtain and clothing and needed items for shower. Pt ambulating to tub room ~ 150 feet from room with Mod I. Tub transfer onto TTB with supervision for safety. Pt seated outside of shower to donn clothing with Mod I and use of RW for balance when standing. Elastic shoe laces places in shoes and sock aide utilized as well for independence. Pt ambulated safety back to room to return to recliner chair. Call bell and all needed items within reach.   Session 2: Upon entering the room, pt seated in recliner chair with no c/o pain. Pt ambulating to kitchen with Mod I and use of RW. Pt engaged in simulated kitchen task for simple meal prep with supervision for safety. Pt demonstrated the ability  to obtain items from cabinets and refrigerator and manage them across the counter safely as needed. OT educated pt on kitchen safety when utilizing RW in kitchen with pt verbalizing understanding. Pt ambulated to laundry room with RW to demonstrate ability to obtain items for washer and then place and remove from dryer with [facility] reacher with supervision for safety. This is the only parts of laundry pt was responsible for prior to admission as this is shared task with wife. Pt ambulating to room for rest break secondary to fatigue at end of session.Call bell and all needed items within reach upon exiting the room.   OT Discharge Precautions/Restrictions  Precautions Precautions: Fall Precaution Comments: L hemi paresis Restrictions Weight Bearing Restrictions: No Pain Pain Assessment Pain Assessment: No/denies pain Pain Score: 0-No pain Cognition Overall Cognitive Status: Within Functional Limits for tasks assessed Arousal/Alertness: Awake/alert Orientation Level: Oriented X4 Attention: Alternating Alternating Attention: Appears intact Memory: Appears intact Awareness: Appears intact Problem Solving: Appears intact Safety/Judgment: Appears intact Sensation Sensation Light Touch: Appears Intact Stereognosis: Not tested Hot/Cold: Not tested Proprioception: Appears Intact Coordination Gross Motor Movements are Fluid and Coordinated: No Fine Motor Movements are Fluid and Coordinated: No Coordination and Movement Description: contines to demonstrate slightly decreased coordination in LUE>LE during coordination testing and functional tasks.   Motor  Motor Motor: Hemiplegia Motor - Discharge Observations: L hemiparesis (coordination and strength), decreased dynamic balance Mobility  Bed Mobility Bed Mobility: Supine to Sit;Sit to Supine Supine to Sit: 6: Modified independent (Device/Increase time) Sit to Supine: 6: Modified independent (Device/Increase time) Transfers Transfers: Sit  to Stand;Stand to Sit Sit to Stand: 6: Modified independent (Device/Increase time)  Trunk/Postural Assessment  Cervical Assessment Cervical Assessment: Exceptions to [facility] Cervical Strength Overall Cervical Strength Comments: forward head Thoracic Assessment Thoracic Assessment: Exceptions to [facility] Thoracic Strength Overall Thoracic Strength Comments: kyphotic posture Lumbar Assessment Lumbar Assessment: Exceptions to [facility] Lumbar Strength Overall Lumbar Strength Comments: posterior pelvic tilt Postural Control Postural Control: Deficits on evaluation  Balance Balance Balance Assessed: Yes Dynamic Standing Balance Dynamic Standing - Balance Support: During functional activity;Left upper extremity supported Dynamic Standing - Level of Assistance: 6: Modified independent (Device/Increase time) Extremity/Trunk Assessment RUE Assessment RUE Assessment: Within Functional Limits LUE Assessment LUE Assessment: Exceptions to WFL LUE PROM (degrees) LUE Overall PROM Comments: WFLs - LUE Strength LUE Overall Strength Comments: shoulder, elbow, and wrist 4/5 grossly. L hand 3-/5  See FIM for current functional status  [name] 07/08/2014, 3:59 PM

## 2014-07-08 NOTE — Progress Notes (Signed)
Physical Therapy Discharge Summary  Patient Details  [name] MRN: [id number] Date of Birth: 1927/11/09  Today's Date: 07/08/2014 PT Individual Time: 0830-0930 PT Individual Time Calculation (min): 60 min    Patient has met 9 of 9 long term goals due to improved activity tolerance, improved balance, improved postural control, increased strength, ability to compensate for deficits, functional use of  left upper extremity and left lower extremity and improved awareness.  Patient to discharge at an ambulatory level Modified Independent.   Patient's care partner did not participate in formal family training, as pt will be mod I level at home, S for stairs.     Reasons goals not met: n/a  Recommendation:  Patient will benefit from ongoing skilled PT services in home health setting to continue to advance safe functional mobility, address ongoing impairments in balance, overall strengthening, endurance, LUE functional use, and minimize fall risk.  Equipment: recommended second RW for upstairs  Reasons for discharge: treatment goals met and discharge from hospital  Patient/family agrees with progress made and goals achieved: Yes   PT Treatment/Intervention:  Pt received sitting in recliner in room, agreeable to therapy session.  Skilled session focused on grad day activities and checking off goals to ensure safe D/C tomorrow.  Performed all ambulation around unit in controlled and home environment at mod I level with use of RW.  Performed all functional transfers to varying surfaces at mod I level, with exception of car transfer in which he requires S for set up and management of RW.  Performed BERG balance test with improved score of 34/56 and TUG with improved time of 17.9 secs, demonstrating clinical change in pts balance, however still indicative of needing continued therapy and use of RW at home.  Pt verbalized understanding.  Performed 14, 6" steps with L handrail in step to  fashion sideways at S level with min cues for safety.  Ended session with floor transfer due to high fall risk.  Pt educated on when to attempt and when to call 911 and wait for assist.  Provided demonstration on how to perform with pt returning demonstration at min A level for getting into quadruped and then from half kneeling to standing.  Pt then ambulated back to room and left in recliner.  Made pt Mod I in room for increased independence.  RN made aware.     PT Discharge Precautions/Restrictions Precautions Precautions: Fall Precaution Comments: L hemi paresis Restrictions Weight Bearing Restrictions: No  Pain Pain Assessment Pain Assessment: No/denies pain Pain Score: 0-No pain Vision/Perception   See OT note Cognition Overall Cognitive Status: Within Functional Limits for tasks assessed Arousal/Alertness: Awake/alert Orientation Level: Oriented X4 Attention: Alternating Alternating Attention: Appears intact Memory: Appears intact Awareness: Appears intact Problem Solving: Appears intact Safety/Judgment: Appears intact Sensation Sensation Light Touch: Appears Intact Stereognosis: Not tested Hot/Cold: Not tested Proprioception: Appears Intact Coordination Gross Motor Movements are Fluid and Coordinated: No Fine Motor Movements are Fluid and Coordinated: No Coordination and Movement Description: contines to demonstrate slightly decreased coordination in LUE>LE during coordination testing and functional tasks.   Finger Nose Finger Test: Tends to over shoot during functional tasks during PT sessions.  Heel Shin Test: decreased fluidity Motor  Motor Motor: Hemiplegia Motor - Discharge Observations: Pt with continued L hemiparesis (LUE>LLE), decreased balance  Mobility Bed Mobility Bed Mobility: Supine to Sit;Sit to Supine Supine to Sit: 6: Modified independent (Device/Increase time) Sit to Supine: 6: Modified independent (Device/Increase time) Transfers Transfers:  Yes Sit  to Stand: 6: Modified independent (Device/Increase time) Stand to Sit: 6: Modified independent (Device/Increase time) Stand Pivot Transfers: 6: Modified independent (Device/Increase time) Locomotion  Ambulation Ambulation: Yes Ambulation/Gait Assistance: 6: Modified independent (Device/Increase time) Ambulation Distance (Feet): 200 Feet Assistive device: Rolling walker Gait Gait: Yes Gait Pattern: Impaired Gait Pattern: Step-through pattern;Decreased stride length;Decreased weight shift to left;Poor foot clearance - left;Trunk flexed Stairs / Additional Locomotion Stairs: Yes Stairs Assistance: 5: Supervision Stairs Assistance Details: Verbal cues for sequencing;Verbal cues for technique;Verbal cues for precautions/safety Stair Management Technique: One rail Left;Step to pattern;Sideways Number of Stairs: 14 Height of Stairs: 6 Wheelchair Mobility Wheelchair Mobility: No (pt ambulatory) Distance:  (Pt ambulatory)  Trunk/Postural Assessment  Cervical Assessment Cervical Assessment: Exceptions to [facility] Cervical Strength Overall Cervical Strength Comments: forward head Thoracic Assessment Thoracic Assessment: Exceptions to [facility] Thoracic Strength Overall Thoracic Strength Comments: kyphotic posture Lumbar Assessment Lumbar Assessment: Exceptions to [facility] Lumbar Strength Overall Lumbar Strength Comments: posterior pelvic tilt Postural Control Postural Control: Deficits on evaluation Protective Responses: delayed, but hip and ankle strategy are improved from evaluation Postural Limitations: Pt with very forward flexed posture at trunk, hips and knees.   Balance Balance Balance Assessed: Yes Standardized Balance Assessment Standardized Balance Assessment: Berg Balance Test;Timed Up and Go Test Berg Balance Test Sit to Stand: Able to stand without using hands and stabilize independently Standing Unsupported: Able to stand safely 2 minutes Sitting with Back Unsupported but  Feet Supported on Floor or Stool: Able to sit safely and securely 2 minutes Stand to Sit: Sits safely with minimal use of hands Transfers: Able to transfer safely, minor use of hands Standing Unsupported with Eyes Closed: Able to stand 10 seconds with supervision Standing Ubsupported with Feet Together: Needs help to attain position but able to stand for 30 seconds with feet together From Standing, Reach Forward with Outstretched Arm: Reaches forward but needs supervision From Standing Position, Pick up Object from Floor: Unable to pick up and needs supervision From Standing Position, Turn to Look Behind Over each Shoulder: Turn sideways only but maintains balance Turn 360 Degrees: Able to turn 360 degrees safely but slowly Standing Unsupported, Alternately Place Feet on Step/Stool: Able to complete >2 steps/needs minimal assist Standing Unsupported, One Foot in Front: Able to take small step independently and hold 30 seconds Standing on One Leg: Tries to lift leg/unable to hold 3 seconds but remains standing independently Total Score: 34 Timed Up and Go Test TUG: Normal TUG Normal TUG (seconds): 17.9 (with RW, no assist this time, avg of three trials) Static Sitting Balance Static Sitting - Balance Support: Feet supported Static Sitting - Level of Assistance: 6: Modified independent (Device/Increase time) Static Standing Balance Static Standing - Balance Support: No upper extremity supported;During functional activity Static Standing - Level of Assistance: 6: Modified independent (Device/Increase time) Dynamic Standing Balance Dynamic Standing - Balance Support: During functional activity;Left upper extremity supported Dynamic Standing - Level of Assistance: 6: Modified independent (Device/Increase time) Extremity Assessment      RLE Assessment RLE Assessment: Exceptions to [facility] RLE Strength RLE Overall Strength: Deficits RLE Overall Strength Comments: grossly WFL, weak hip flexors,  tight hamstrings and heel cords LLE Assessment LLE Assessment: Exceptions to [facility] LLE Strength LLE Overall Strength: Deficits LLE Overall Strength Comments: hip flex 3+/5, hip ext 4/5, knee flex 4/5, knee ext 4/5, ankle DF 2+/5 and ankle PF 4/5  See FIM for current functional status  Denice Bors 07/08/2014, 10:45 AM

## 2014-07-08 NOTE — Progress Notes (Signed)
Social Work Discharge Note Discharge Note  The overall goal for the admission was met for:   Discharge location: Yes-HOME WITH WIFE WHO CAN BE THERE WITH HIM, NOT PROVIDE CARE  Length of Stay: Yes-10 DAYS  Discharge activity level: Yes-MOD/I-SUPERVISION LEVEL  Home/community participation: Yes  Services provided included: MD, RD, PT, OT, SLP, RN, CM, Pharmacy and SW  Financial Services: Medicare and Private Insurance: [employer]  Follow-up services arranged: Home Health: [place] [facility]-PT,OT,RN and Patient/Family request agency HH: PREF HAS USED THEM BEFORE, DME: NO NEEDS  Comments (or additional information):PT DID VERY WELL HERE AND PROGRESSED TO MOD/I LEVEL-WIFE IS THERE WITH HIM BUT CAN NOT ASSIST HIM, DUE TO HER OWN HEALTH ISSUES  Patient/Family verbalized understanding of follow-up arrangements: Yes  Individual responsible for coordination of the follow-up plan: SELF & MARY-WIFE  Confirmed correct DME delivered: Elease Hashimoto 07/08/2014    Elease Hashimoto

## 2014-07-08 NOTE — Progress Notes (Signed)
81 year old male with H/o DM type 2, HTN, CABG with bioprosthetic AVR, A fib who has been off coumadin X 2 months due to GIB/colonic AVM. He was admitted to [place] on 06/27/14 with complaint of left arm numbness and weakness. CT head negative for acute changes. MRI brain done revealing small acute cortical/subcortical infarct right frontal lobe in R-MCA territory, left frontal lobe encephalomalacia question prior trauma, moderate to severe cerebral atrophy and chronic infarcts in cerebellum and thalami. C  Subjective/Complaints: Discussed CBGs as well as post d/c plans /instructions  Review of Systems - Negative except difficulty with sleeping, up and down during the night  Objective: Vital Signs: Blood pressure 127/47, pulse 60, temperature 97.7 F (36.5 C), temperature source Oral, resp. rate 18, height $RemoveBe'5\' 10"'kBWkaOFWl$  (1.778 m), weight 105.4 kg (232 lb 5.8 oz), SpO2 98 %. No results found. Results for orders placed or performed during the hospital encounter of 06/29/14 (from the past 72 hour(s))  Glucose, capillary     Status: Abnormal   Collection Time: 07/05/14 12:10 PM  Result Value Ref Range   Glucose-Capillary 182 (H) 70 - 99 mg/dL  Glucose, capillary     Status: Abnormal   Collection Time: 07/05/14  4:50 PM  Result Value Ref Range   Glucose-Capillary 143 (H) 70 - 99 mg/dL  Glucose, capillary     Status: Abnormal   Collection Time: 07/05/14  9:08 PM  Result Value Ref Range   Glucose-Capillary 214 (H) 70 - 99 mg/dL  Glucose, capillary     Status: Abnormal   Collection Time: 07/06/14  6:40 AM  Result Value Ref Range   Glucose-Capillary 133 (H) 70 - 99 mg/dL  Hemoglobin and hematocrit, blood     Status: Abnormal   Collection Time: 07/06/14  7:55 AM  Result Value Ref Range   Hemoglobin 10.1 (L) 13.0 - 17.0 g/dL   HCT 32.6 (L) 39.0 - 02.7 %  Basic metabolic panel     Status: Abnormal   Collection Time: 07/06/14  7:55 AM  Result Value Ref Range   Sodium 137 135 - 145 mmol/L   Potassium  4.3 3.5 - 5.1 mmol/L   Chloride 101 96 - 112 mmol/L   CO2 26 19 - 32 mmol/L   Glucose, Bld 113 (H) 70 - 99 mg/dL   BUN 41 (H) 6 - 23 mg/dL   Creatinine, Ser 1.61 (H) 0.50 - 1.35 mg/dL   Calcium 9.3 8.4 - 10.5 mg/dL   GFR calc non Af Amer 38 (L) >90 mL/min   GFR calc Af Amer 44 (L) >90 mL/min    Comment: (NOTE) The eGFR has been calculated using the CKD EPI equation. This calculation has not been validated in all clinical situations. eGFR's persistently <90 mL/min signify possible Chronic Kidney Disease.    Anion gap 10 5 - 15  Glucose, capillary     Status: Abnormal   Collection Time: 07/06/14 11:32 AM  Result Value Ref Range   Glucose-Capillary 52 (L) 70 - 99 mg/dL   Comment 1 Notify RN   Glucose, capillary     Status: None   Collection Time: 07/06/14 11:53 AM  Result Value Ref Range   Glucose-Capillary 70 70 - 99 mg/dL  Glucose, capillary     Status: Abnormal   Collection Time: 07/06/14 12:53 PM  Result Value Ref Range   Glucose-Capillary 183 (H) 70 - 99 mg/dL   Comment 1 Notify RN   Glucose, capillary     Status: Abnormal  Collection Time: 07/06/14  4:57 PM  Result Value Ref Range   Glucose-Capillary 326 (H) 70 - 99 mg/dL   Comment 1 Notify RN   Glucose, capillary     Status: Abnormal   Collection Time: 07/06/14  9:10 PM  Result Value Ref Range   Glucose-Capillary 224 (H) 70 - 99 mg/dL  Glucose, capillary     Status: None   Collection Time: 07/07/14  6:54 AM  Result Value Ref Range   Glucose-Capillary 94 70 - 99 mg/dL  Hemoglobin and hematocrit, blood     Status: Abnormal   Collection Time: 07/07/14 10:23 AM  Result Value Ref Range   Hemoglobin 9.9 (L) 13.0 - 17.0 g/dL   HCT 32.0 (L) 39.0 - 52.0 %  Glucose, capillary     Status: Abnormal   Collection Time: 07/07/14 12:18 PM  Result Value Ref Range   Glucose-Capillary 279 (H) 70 - 99 mg/dL  Glucose, capillary     Status: Abnormal   Collection Time: 07/07/14  5:06 PM  Result Value Ref Range   Glucose-Capillary  229 (H) 70 - 99 mg/dL   Comment 1 Notify RN   Glucose, capillary     Status: Abnormal   Collection Time: 07/07/14  8:36 PM  Result Value Ref Range   Glucose-Capillary 222 (H) 70 - 99 mg/dL  Hemoglobin and hematocrit, blood     Status: Abnormal   Collection Time: 07/08/14  6:13 AM  Result Value Ref Range   Hemoglobin 9.3 (L) 13.0 - 17.0 g/dL   HCT 29.5 (L) 39.0 - 52.0 %  Glucose, capillary     Status: Abnormal   Collection Time: 07/08/14  6:39 AM  Result Value Ref Range   Glucose-Capillary 152 (H) 70 - 99 mg/dL     HEENT: normal Cardio: Irregular and mild systolic murmur Resp: CTA B/L and unlabored GI: BS positive and NT, ND Extremity:  Pulses positive and No Edema Skin:   Intact Neuro: Alert/Oriented, Cranial Nerve II-XII normal, Abnormal Sensory reduced Left hand, Abnormal Motor 2- Wrist and finger ext 3+/5 Left bicep, triceps,delt and Abnormal FMC Ataxic/ dec FMC Musc/Skel:  Other OA PIP, DIPs Gen NAD   Assessment/Plan: 1. Functional deficits secondary to Right MCA distribution infarct with LUE> LE weakness which require 3+ hours per day of interdisciplinary therapy in a comprehensive inpatient rehab setting. Physiatrist is providing close team supervision and 24 hour management of active medical problems listed below. Physiatrist and rehab team continue to assess barriers to discharge/monitor patient progress toward functional and medical goals. Plan D/C in am after MD rounds D/C instructions this pm No driving until evaluated in office in ~3wks FIM: FIM - Bathing Bathing Steps Patient Completed: Chest, Right Arm, Left Arm, Abdomen, Front perineal area, Buttocks, Right upper leg, Left upper leg, Right lower leg (including foot), Left lower leg (including foot) Bathing: 5: Supervision: Safety issues/verbal cues  FIM - Upper Body Dressing/Undressing Upper body dressing/undressing steps patient completed: Button/unbutton shirt Upper body dressing/undressing: 5: Supervision:  Safety issues/verbal cues FIM - Lower Body Dressing/Undressing Lower body dressing/undressing steps patient completed: Don/Doff right sock, Don/Doff left sock, Don/Doff right shoe, Don/Doff left shoe Lower body dressing/undressing: 4: Min-Patient completed 75 plus % of tasks  FIM - Toileting Toileting steps completed by patient: Adjust clothing prior to toileting, Performs perineal hygiene, Adjust clothing after toileting Toileting Assistive Devices: Grab bar or rail for support Toileting: 3: Mod-Patient completed 2 of 3 steps  FIM - Radio producer Devices: Environmental consultant,  Grab bars Toilet Transfers: 5-To toilet/BSC: Supervision (verbal cues/safety issues), 5-From toilet/BSC: Supervision (verbal cues/safety issues)  FIM - Control and instrumentation engineer Devices: Copy: 5: Bed > Chair or W/C: Supervision (verbal cues/safety issues)  FIM - Locomotion: Wheelchair Distance: 100 Locomotion: Wheelchair: 0: Activity did not occur FIM - Locomotion: Ambulation Locomotion: Ambulation Assistive Devices: Administrator Ambulation/Gait Assistance: 5: Supervision Locomotion: Ambulation: 5: Travels 150 ft or more with supervision/safety issues  Comprehension Comprehension Mode: Auditory Comprehension: 6-Follows complex conversation/direction: With extra time/assistive device  Expression Expression Mode: Verbal Expression: 7-Expresses complex ideas: With no assist  Social Interaction Social Interaction: 6-Interacts appropriately with others with medication or extra time (anti-anxiety, antidepressant).  Problem Solving Problem Solving: 5-Solves complex 90% of the time/cues < 10% of the time  Memory Memory: 5-Recognizes or recalls 90% of the time/requires cueing < 10% of the time  Medical Problem List and Plan: 1. Functional deficits secondary to Right frontal infarct with left hemiparesis 2. DVT Prophylaxis/Anticoagulation:  Pharmaceutical:d/c  Lovenox, restarted Eliquis 1/25 3. Pain Management: Tylenol  4. Mood: Monitor for lability and post CVA mood d/o 5. Neuropsych: This patient is capable of making decisions on his own behalf. 6. Skin/Wound Care: Routine pressure relief measures. Maintain adequate nutritional and hydration status. Rehab RN to monitor skin daily. 7. Fluids/Electrolytes/Nutrition: increased lantus to 50 units---may need further tiration   -adjusted SSI to patient's preference  -follow today for pattern 9. CKD: Baseline Cr-1.8. Will check labs in am to monitor stability.  10. CAD s/p AVR: On lasix, coreg, norvasc and lipitor.  11. Colonic AVM with GIB: checking stool guaiacs. No gross hematochezia To start eliquis since  H/H stable. Daily H & H -12. A fib: Will monitor heart rate every 8 hours. Continue coreg bid. On Eliquis LOS (Days) 9 A FACE TO FACE EVALUATION WAS PERFORMED  KIRSTEINS,ANDREW E 07/08/2014, 7:40 AM

## 2014-07-09 LAB — GLUCOSE, CAPILLARY: Glucose-Capillary: 186 mg/dL — ABNORMAL HIGH (ref 70–99)

## 2014-07-09 NOTE — Progress Notes (Signed)
[name] is a 81 y.o. male 21-May-1928 [id number]  Subjective:  Ready to go home! Feels well - no pain, no new weakness or numbness. No GI bleeding  Objective: Vital signs in last 24 hours: Temp:  [97.6 F (36.4 C)-97.8 F (36.6 C)] 97.8 F (36.6 C) (01/30 0505) Pulse Rate:  [65-69] 69 (01/30 0505) Resp:  [16-18] 18 (01/30 0505) BP: (134-141)/(47-52) 134/47 mmHg (01/30 0505) SpO2:  [99 %-100 %] 99 % (01/30 0505) Weight:  [234 lb 3.2 oz (106.232 kg)] 234 lb 3.2 oz (106.232 kg) (01/30 0500) Weight change: 1 lb 13.4 oz (0.832 kg) Last BM Date: 07/08/14  Intake/Output from previous day: 01/29 0701 - 01/30 0700 In: 744 [P.O.:744] Out: 700 [Urine:700]  Physical Exam General: No apparent distress   Sitting in [facility], ready for DC home! Lungs: Normal effort. Lungs clear to auscultation, no crackles or wheezes. Cardiovascular: irreg rate and rhythm, no edema Neurological: syable mild L hand diminished strength and coordination in pincher Wounds: N/A    Lab Results: BMET    Component Value Date/Time   NA 137 07/06/2014 0755   NA 140 05/12/2013 1631   K 4.3 07/06/2014 0755   CL 101 07/06/2014 0755   CO2 26 07/06/2014 0755   GLUCOSE 113* 07/06/2014 0755   GLUCOSE 83 05/12/2013 1631   BUN 41* 07/06/2014 0755   BUN 29* 05/12/2013 1631   CREATININE 1.61* 07/06/2014 0755   CALCIUM 9.3 07/06/2014 0755   GFRNONAA 38* 07/06/2014 0755   GFRAA 44* 07/06/2014 0755   CBC    Component Value Date/Time   WBC 5.9 07/04/2014 0615   WBC 6.5 05/12/2013 1631   RBC 3.57* 07/04/2014 0615   RBC 3.88* 05/12/2013 1631   HGB 9.3* 07/08/2014 0613   HCT 29.5* 07/08/2014 0613   PLT 271 07/04/2014 0615   MCV 83.8 07/04/2014 0615   MCH 26.1 07/04/2014 0615   MCH 27.8 05/12/2013 1631   MCHC 31.1 07/04/2014 0615   MCHC 33.0 05/12/2013 1631   RDW 13.9 07/04/2014 0615   RDW 14.6 05/12/2013 1631   LYMPHSABS 1.2 06/30/2014 0708   LYMPHSABS 0.9 12/18/2011 1433   MONOABS 0.6 06/30/2014 0708   EOSABS 0.5 06/30/2014 0708   EOSABS 0.3 12/18/2011 1433   BASOSABS 0.0 06/30/2014 0708   BASOSABS 0.0 12/18/2011 1433   CBG's (last 3):   Recent Labs  07/08/14 1642 07/08/14 2044 07/09/14 0655  GLUCAP 235* 216* 186*   LFT's Lab Results  Component Value Date   ALT 15 06/30/2014   AST 28 06/30/2014   ALKPHOS 85 06/30/2014   BILITOT 0.8 06/30/2014    Studies/Results: No results found.  Medications:  I have reviewed the patient's current medications. Scheduled Medications: . amLODipine  5 mg Oral Daily  . apixaban  2.5 mg Oral BID  . atorvastatin  40 mg Oral q1800  . bacitracin   Topical BID  . carvedilol  6.25 mg Oral BID WC  . furosemide  20 mg Oral Daily  . insulin aspart  0-10 Units Subcutaneous TID AC & HS  . insulin glargine  50 Units Subcutaneous Daily  . iron polysaccharides  150 mg Oral BID  . losartan  25 mg Oral Daily  . multivitamin with minerals  1 tablet Oral Daily  . multivitamin-lutein  1 capsule Oral BID  . nitroGLYCERIN  0.4 mg Transdermal Daily  . pantoprazole  40 mg Oral Daily  . tamsulosin  0.4 mg Oral QPC supper  . triamcinolone 0.1 % cream :  eucerin   Topical TID  . vitamin C  500 mg Oral Daily   PRN Medications: acetaminophen, alum & mag hydroxide-simeth, bisacodyl, diphenhydrAMINE, guaiFENesin-dextromethorphan, levalbuterol, prochlorperazine **OR** prochlorperazine **OR** prochlorperazine, senna-docusate, traZODone  Assessment/Plan: Principal Problem:   Acute left hemiparesis Active Problems:   Gastrointestinal bleed   CAD (coronary artery disease)   S/P AVR   Atrial fibrillation   Anemia   CVA (cerebral infarction)   HTN (hypertension)   Diabetes   Type 2 diabetes, controlled, with peripheral neuropathy  1. Functional deficits secondary to Right frontal infarct with left hemiparesis - L thumb and index coordination improving, LLE symptoms improved 2. DVT Prophylaxis/Anticoagulation: Pharmaceutical: transitioned from Lovenox and  restarted Eliquis 1/25 3. Pain Management: Tylenol prn 4. Mood: Monitor for lability and post CVA mood d/o 5. Neuropsych: This patient is capable of making decisions on his own behalf. 6. Skin/Wound Care: Routine pressure relief measures. Maintain adequate nutritional and hydration status. Rehab RN to monitor skin daily. 7. Fluids/Electrolytes/Nutrition: increased lantus to 50 units---may need further tiration  -adjusted SSI to patient's preference -follow today for pattern 9. CKD: Baseline Cr-1.8. -stable  10. CAD s/p AVR: On lasix, coreg, norvasc and lipitor.  11. Colonic AVM with GIB: checking stool guaiacs. No gross hematochezia. Restarted on eliquis 1/25 sincehgb stable  2. A fib: rate controlled. Continue coreg bid. On Eliquis  Length of stay, days: 10  Stable for DC home today as planned  Valerie A. Asa Lente, MD 07/09/2014, 9:54 AM

## 2014-07-09 NOTE — Progress Notes (Signed)
Patient discharged about 1115 to home with family and all belongings. Patient given discharge instructions yesterday via Reesa Chew PA. Patient denied any questions. Patient has all belongings and equipment.

## 2014-07-11 NOTE — Discharge Summary (Signed)
Physician Discharge Summary  Patient ID: [name] MRN: [id number] DOB/AGE: [birth year] 81 y.o.  Admit date: 06/29/2014 Discharge date: 07/09/14  Discharge Diagnoses:  Principal Problem:   Acute left hemiparesis Active Problems:   Gastrointestinal bleed   CAD (coronary artery disease)   S/P AVR   Atrial fibrillation   Anemia   CVA (cerebral infarction)   HTN (hypertension)   Diabetes   Type 2 diabetes, controlled, with peripheral neuropathy   Discharged Condition: Stable.   Significant Diagnostic Studies: Dg Chest 2 View  06/29/2014   CLINICAL DATA:  Initial evaluation for shortness of breath and wheezing  EXAM: CHEST  2 VIEW  COMPARISON:  08/27/2007  FINDINGS: Moderately severe cardiac enlargement with mild vascular congestion. There is peribronchial cuffing with mild background interstitial prominence but no definite Kerley B lines. No consolidation or effusion. Status post median sternotomy. Aortic valve replacement noted.  IMPRESSION: Cardiac enlargement with vascular congestion and peribronchial cuffing and borderline interstitial prominence. Findings are concerning for minimal interstitial pulmonary edema.   Electronically Signed   By: Skipper Cliche M.D.   On: 06/29/2014 13:33    Labs:  Basic Metabolic Panel:  Recent Labs Lab 07/06/14 0755  NA 137  K 4.3  CL 101  CO2 26  GLUCOSE 113*  BUN 41*  CREATININE 1.61*  CALCIUM 9.3    CBC:  Recent Labs Lab 07/06/14 0755 07/07/14 1023 07/08/14 0613  HGB 10.1* 9.9* 9.3*  HCT 32.6* 32.0* 29.5*    CBG:  Recent Labs Lab 07/08/14 0639 07/08/14 1204 07/08/14 1642 07/08/14 2044 07/09/14 0655  GLUCAP 152* 158* 235* 216* 186*    Filed Vitals:   07/09/14 0505  BP: 134/47  Pulse: 69  Temp: 97.8 F (36.6 C)  Resp: 18    Brief HPI:   [name] is an 81 year old male with H/o DM type 2, HTN, CABG with bioprosthetic AVR, A fib who has been off coumadin X 2 months due to GIB/colonic AVM. He was  admitted to [facility] on 06/27/14 with complaint of left arm numbness and weakness. MRI brain done revealing small acute cortical/subcortical infarct right frontal lobe in R-MCA territory, left frontal lobe encephalomalacia question prior trauma, moderate to severe cerebral atrophy and chronic infarcts in cerebellum and thalami.  Neurology was consulted for input and felt that patient had embolic stroke in setting of A fib and recommended initiating Eliquis one week past initial stroke and monitor H/H. Patient with resultant LUE weakness as well as  He developed SOB with wheezing last pm and had improvement of symptoms with nebs.   Hospital Course: [name] was admitted to rehab 06/29/2014 for inpatient therapies to consist of PT and OT at least three hours five days a week. Past admission physiatrist, therapy team and rehab RN have worked together to provide customized collaborative inpatient rehab.  Blood pressures have been well controlled on current regimen. Labs done at admission showed some rise in BUN to 40 but Cr has improved overall. CBC at admission showed H/H at 9.1/29.2. This has been monitored with daily check once eliquis was resumed on 07/04/14. H/H has stabilized to 9.3 and  HHRN to draw CBC on 02/2 with results to Dr. Derrel Nip. Patient has been advised to monitor for any signs of bleeding.  Po intake has been good and he is continent of bowel and bladder. His diabetes has been monitored on ac/hs basis and BS have shown elevation up to 200s. He is to continue his  home regimen with qid CBG checks and follow up with PMD for further adjustment as needed. He has made great progress during his rehab stay and is modified independent at discharge. He will continue to receive HHPT and HHOT by [facility] past discharge.   Rehab course: During patient's stay in rehab weekly team conferences were held to monitor patient's progress, set goals and discuss barriers to discharge. Patient has had  improvement in activity tolerance, balance, postural control, as well as ability to compensate for deficits. He has had improvement in functional use LUE  and LLE as well as improved awareness. He able to perform bathing and dressing independently but needs supervision with shower transfers. He is modified independent for transfers and mobility. He is able to navigate stairs with supervision.    Disposition: 01-Home or Self Care   Diet: Diabetic diet/Heart Healthy.   Special Instructions: 1. Nurse to draw CBC/blood count on 02/2 with results to Dr. Derrel Nip.  2. Check blood sugar before meals and at bedtime.      Medication List    TAKE these medications        amLODipine 5 MG tablet  Commonly known as:  NORVASC  Take 1 tablet (5 mg total) by mouth daily.     apixaban 2.5 MG Tabs tablet  Commonly known as:  ELIQUIS  Take 1 tablet (2.5 mg total) by mouth 2 (two) times daily.     atorvastatin 40 MG tablet  Commonly known as:  LIPITOR  Take 1 tablet (40 mg total) by mouth daily at 6 PM.     carvedilol 6.25 MG tablet  Commonly known as:  COREG  Take 6.25 mg by mouth 2 (two) times daily with a meal.     cholecalciferol 1000 UNITS tablet  Commonly known as:  VITAMIN D  Take 1,000 Units by mouth daily.     furosemide 40 MG tablet  Commonly known as:  LASIX  Take 20 mg by mouth daily.     insulin glargine 100 UNIT/ML injection  Commonly known as:  LANTUS  Inject 0.5 mLs (50 Units total) into the skin daily.     insulin lispro 100 UNIT/ML injection  Commonly known as:  HUMALOG  Inject into the skin 4 (four) times daily - after meals and at bedtime.  Notes to Patient:  Continue to use your home sliding scale.      iron polysaccharides 150 MG capsule  Commonly known as:  NIFEREX  Take 1 capsule (150 mg total) by mouth 2 (two) times daily.     losartan 25 MG tablet  Commonly known as:  COZAAR  Take 1 tablet (25 mg total) by mouth daily.     MULTI-VITAMIN PO  Take by  mouth.     nitroGLYCERIN 0.4 mg/hr patch  Commonly known as:  NITRODUR - Dosed in mg/24 hr  Place 1 patch (0.4 mg total) onto the skin daily. Apply at 6 am and remove at 6 pm daily.     pantoprazole 40 MG tablet  Commonly known as:  PROTONIX  Take 40 mg by mouth daily.     tamsulosin 0.4 MG Caps capsule  Commonly known as:  FLOMAX  Take 1 capsule (0.4 mg total) by mouth daily after supper.     VITAMIN C PO  Take by mouth.           Follow-up Information    Follow up with Charlett Blake, MD On 08/05/2014.   Specialty:  Physical  Medicine and Rehabilitation   Why:  Be there at 10:45  for 11:15 am appointment   Contact information:   [place] [address] [phone]       Follow up with [name], MD On 07/19/2014.   Specialty:  Internal Medicine   Why:  APPT @ 3;30 PM   Contact information:   [place] [address] [phone]       Follow up with Ida Rogue, MD. Call today.   Specialty:  Cardiology   Why:  for follow up appointment in 2 weeks.    Contact information:   [place] [address] [phone]       Signed: Bary Leriche 07/11/2014, 5:43 PM

## 2014-07-12 DIAGNOSIS — K922 Gastrointestinal hemorrhage, unspecified: Secondary | ICD-10-CM | POA: Diagnosis not present

## 2014-07-12 DIAGNOSIS — I6789 Other cerebrovascular disease: Secondary | ICD-10-CM | POA: Diagnosis not present

## 2014-07-12 DIAGNOSIS — I10 Essential (primary) hypertension: Secondary | ICD-10-CM | POA: Diagnosis not present

## 2014-07-12 DIAGNOSIS — E1140 Type 2 diabetes mellitus with diabetic neuropathy, unspecified: Secondary | ICD-10-CM | POA: Diagnosis not present

## 2014-07-12 DIAGNOSIS — I2510 Atherosclerotic heart disease of native coronary artery without angina pectoris: Secondary | ICD-10-CM | POA: Diagnosis not present

## 2014-07-12 DIAGNOSIS — I350 Nonrheumatic aortic (valve) stenosis: Secondary | ICD-10-CM | POA: Diagnosis not present

## 2014-07-12 DIAGNOSIS — D649 Anemia, unspecified: Secondary | ICD-10-CM | POA: Diagnosis not present

## 2014-07-12 DIAGNOSIS — I69354 Hemiplegia and hemiparesis following cerebral infarction affecting left non-dominant side: Secondary | ICD-10-CM | POA: Diagnosis not present

## 2014-07-12 DIAGNOSIS — E785 Hyperlipidemia, unspecified: Secondary | ICD-10-CM | POA: Diagnosis not present

## 2014-07-12 DIAGNOSIS — Z794 Long term (current) use of insulin: Secondary | ICD-10-CM | POA: Diagnosis not present

## 2014-07-12 DIAGNOSIS — Z5181 Encounter for therapeutic drug level monitoring: Secondary | ICD-10-CM | POA: Diagnosis not present

## 2014-07-12 LAB — CBC AND DIFFERENTIAL
HEMATOCRIT: 32 % — AB (ref 41–53)
HEMOGLOBIN: 9.8 g/dL — AB (ref 13.5–17.5)
NEUTROS ABS: 6 /uL
Platelets: 326 10*3/uL (ref 150–399)
WBC: 8.7 10^3/mL

## 2014-07-12 LAB — HEMOGLOBIN A1C: Hgb A1c MFr Bld: 7.8 % — AB (ref 4.0–6.0)

## 2014-07-13 DIAGNOSIS — I2510 Atherosclerotic heart disease of native coronary artery without angina pectoris: Secondary | ICD-10-CM | POA: Diagnosis not present

## 2014-07-13 DIAGNOSIS — I69354 Hemiplegia and hemiparesis following cerebral infarction affecting left non-dominant side: Secondary | ICD-10-CM | POA: Diagnosis not present

## 2014-07-13 DIAGNOSIS — D649 Anemia, unspecified: Secondary | ICD-10-CM | POA: Diagnosis not present

## 2014-07-13 DIAGNOSIS — E1140 Type 2 diabetes mellitus with diabetic neuropathy, unspecified: Secondary | ICD-10-CM | POA: Diagnosis not present

## 2014-07-13 DIAGNOSIS — I350 Nonrheumatic aortic (valve) stenosis: Secondary | ICD-10-CM | POA: Diagnosis not present

## 2014-07-13 DIAGNOSIS — K922 Gastrointestinal hemorrhage, unspecified: Secondary | ICD-10-CM | POA: Diagnosis not present

## 2014-07-14 ENCOUNTER — Encounter: Payer: Self-pay | Admitting: Internal Medicine

## 2014-07-14 DIAGNOSIS — E1140 Type 2 diabetes mellitus with diabetic neuropathy, unspecified: Secondary | ICD-10-CM | POA: Diagnosis not present

## 2014-07-14 DIAGNOSIS — I69354 Hemiplegia and hemiparesis following cerebral infarction affecting left non-dominant side: Secondary | ICD-10-CM | POA: Diagnosis not present

## 2014-07-14 DIAGNOSIS — D649 Anemia, unspecified: Secondary | ICD-10-CM | POA: Diagnosis not present

## 2014-07-14 DIAGNOSIS — K922 Gastrointestinal hemorrhage, unspecified: Secondary | ICD-10-CM | POA: Diagnosis not present

## 2014-07-14 DIAGNOSIS — I2510 Atherosclerotic heart disease of native coronary artery without angina pectoris: Secondary | ICD-10-CM | POA: Diagnosis not present

## 2014-07-14 DIAGNOSIS — I350 Nonrheumatic aortic (valve) stenosis: Secondary | ICD-10-CM | POA: Diagnosis not present

## 2014-07-15 DIAGNOSIS — D649 Anemia, unspecified: Secondary | ICD-10-CM | POA: Diagnosis not present

## 2014-07-15 DIAGNOSIS — I69354 Hemiplegia and hemiparesis following cerebral infarction affecting left non-dominant side: Secondary | ICD-10-CM | POA: Diagnosis not present

## 2014-07-15 DIAGNOSIS — I350 Nonrheumatic aortic (valve) stenosis: Secondary | ICD-10-CM | POA: Diagnosis not present

## 2014-07-15 DIAGNOSIS — I2510 Atherosclerotic heart disease of native coronary artery without angina pectoris: Secondary | ICD-10-CM | POA: Diagnosis not present

## 2014-07-15 DIAGNOSIS — K922 Gastrointestinal hemorrhage, unspecified: Secondary | ICD-10-CM | POA: Diagnosis not present

## 2014-07-15 DIAGNOSIS — E1140 Type 2 diabetes mellitus with diabetic neuropathy, unspecified: Secondary | ICD-10-CM | POA: Diagnosis not present

## 2014-07-18 DIAGNOSIS — I69354 Hemiplegia and hemiparesis following cerebral infarction affecting left non-dominant side: Secondary | ICD-10-CM | POA: Diagnosis not present

## 2014-07-18 DIAGNOSIS — I350 Nonrheumatic aortic (valve) stenosis: Secondary | ICD-10-CM | POA: Diagnosis not present

## 2014-07-18 DIAGNOSIS — K922 Gastrointestinal hemorrhage, unspecified: Secondary | ICD-10-CM | POA: Diagnosis not present

## 2014-07-18 DIAGNOSIS — I2510 Atherosclerotic heart disease of native coronary artery without angina pectoris: Secondary | ICD-10-CM | POA: Diagnosis not present

## 2014-07-18 DIAGNOSIS — D649 Anemia, unspecified: Secondary | ICD-10-CM | POA: Diagnosis not present

## 2014-07-18 DIAGNOSIS — E1140 Type 2 diabetes mellitus with diabetic neuropathy, unspecified: Secondary | ICD-10-CM | POA: Diagnosis not present

## 2014-07-19 ENCOUNTER — Ambulatory Visit (INDEPENDENT_AMBULATORY_CARE_PROVIDER_SITE_OTHER): Payer: Medicare Other | Admitting: Internal Medicine

## 2014-07-19 ENCOUNTER — Encounter: Payer: Self-pay | Admitting: Internal Medicine

## 2014-07-19 VITALS — BP 132/70 | HR 65 | Temp 98.3°F | Resp 14 | Ht 68.75 in | Wt 235.2 lb

## 2014-07-19 DIAGNOSIS — E1129 Type 2 diabetes mellitus with other diabetic kidney complication: Secondary | ICD-10-CM | POA: Diagnosis not present

## 2014-07-19 DIAGNOSIS — N183 Chronic kidney disease, stage 3 unspecified: Secondary | ICD-10-CM

## 2014-07-19 DIAGNOSIS — I63411 Cerebral infarction due to embolism of right middle cerebral artery: Secondary | ICD-10-CM | POA: Diagnosis not present

## 2014-07-19 DIAGNOSIS — I350 Nonrheumatic aortic (valve) stenosis: Secondary | ICD-10-CM | POA: Diagnosis not present

## 2014-07-19 DIAGNOSIS — D500 Iron deficiency anemia secondary to blood loss (chronic): Secondary | ICD-10-CM | POA: Diagnosis not present

## 2014-07-19 DIAGNOSIS — K922 Gastrointestinal hemorrhage, unspecified: Secondary | ICD-10-CM

## 2014-07-19 DIAGNOSIS — IMO0002 Reserved for concepts with insufficient information to code with codable children: Secondary | ICD-10-CM

## 2014-07-19 DIAGNOSIS — E785 Hyperlipidemia, unspecified: Secondary | ICD-10-CM

## 2014-07-19 DIAGNOSIS — E1140 Type 2 diabetes mellitus with diabetic neuropathy, unspecified: Secondary | ICD-10-CM | POA: Diagnosis not present

## 2014-07-19 DIAGNOSIS — E1165 Type 2 diabetes mellitus with hyperglycemia: Secondary | ICD-10-CM

## 2014-07-19 DIAGNOSIS — D649 Anemia, unspecified: Secondary | ICD-10-CM | POA: Diagnosis not present

## 2014-07-19 DIAGNOSIS — I2510 Atherosclerotic heart disease of native coronary artery without angina pectoris: Secondary | ICD-10-CM | POA: Diagnosis not present

## 2014-07-19 DIAGNOSIS — I69354 Hemiplegia and hemiparesis following cerebral infarction affecting left non-dominant side: Secondary | ICD-10-CM | POA: Diagnosis not present
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
## 2014-07-19 NOTE — Progress Notes (Signed)
Pre-visit discussion using our clinic review tool. No additional management support is needed unless otherwise documented below in the visit note.  

## 2014-07-19 NOTE — Patient Instructions (Signed)
We will have [facility] send [place] your recent hgb.  We will have them check your A1c along with any other labs that are needed this week  Do not start the choelsterol medication unless the neurologist strongly advises you   I'll see you again in 3 months

## 2014-07-20 DIAGNOSIS — E1140 Type 2 diabetes mellitus with diabetic neuropathy, unspecified: Secondary | ICD-10-CM | POA: Diagnosis not present

## 2014-07-20 DIAGNOSIS — K922 Gastrointestinal hemorrhage, unspecified: Secondary | ICD-10-CM | POA: Diagnosis not present

## 2014-07-20 DIAGNOSIS — I350 Nonrheumatic aortic (valve) stenosis: Secondary | ICD-10-CM | POA: Diagnosis not present

## 2014-07-20 DIAGNOSIS — I2510 Atherosclerotic heart disease of native coronary artery without angina pectoris: Secondary | ICD-10-CM | POA: Diagnosis not present

## 2014-07-20 DIAGNOSIS — I69354 Hemiplegia and hemiparesis following cerebral infarction affecting left non-dominant side: Secondary | ICD-10-CM | POA: Diagnosis not present

## 2014-07-20 DIAGNOSIS — D649 Anemia, unspecified: Secondary | ICD-10-CM | POA: Diagnosis not present

## 2014-07-20 NOTE — Assessment & Plan Note (Addendum)
Loss of control noted by most recent A1c of 7.8 ion Feb 9.  Foot exam was normal and he is up to date on eye exams.  He has been asked to reutrn a log of blood sugars in two weeks for adjustment of his insulin .  Given his age I do not favor  initiating statin therapy unless his LDL is > 100,  He is on an ARB .    Lab Results  Component Value Date   HGBA1C 7.8* 07/12/2014   No results found for: MICROALBUR, MALB24HUR  No results found for: CHOL, HDL, LDLCALC, LDLDIRECT, TRIG, CHOLHDL

## 2014-07-21 ENCOUNTER — Telehealth: Payer: Self-pay | Admitting: Internal Medicine

## 2014-07-21 ENCOUNTER — Other Ambulatory Visit: Payer: Self-pay | Admitting: *Deleted

## 2014-07-21 DIAGNOSIS — N183 Chronic kidney disease, stage 3 unspecified: Secondary | ICD-10-CM | POA: Insufficient documentation

## 2014-07-21 DIAGNOSIS — E1140 Type 2 diabetes mellitus with diabetic neuropathy, unspecified: Secondary | ICD-10-CM | POA: Diagnosis not present

## 2014-07-21 DIAGNOSIS — I2510 Atherosclerotic heart disease of native coronary artery without angina pectoris: Secondary | ICD-10-CM | POA: Diagnosis not present

## 2014-07-21 DIAGNOSIS — K922 Gastrointestinal hemorrhage, unspecified: Secondary | ICD-10-CM | POA: Diagnosis not present

## 2014-07-21 DIAGNOSIS — I350 Nonrheumatic aortic (valve) stenosis: Secondary | ICD-10-CM | POA: Diagnosis not present

## 2014-07-21 DIAGNOSIS — I69354 Hemiplegia and hemiparesis following cerebral infarction affecting left non-dominant side: Secondary | ICD-10-CM | POA: Diagnosis not present

## 2014-07-21 DIAGNOSIS — D649 Anemia, unspecified: Secondary | ICD-10-CM | POA: Diagnosis not present

## 2014-07-21 MED ORDER — CARVEDILOL 6.25 MG PO TABS: 6.2500 mg | ORAL_TABLET | Freq: Two times a day (BID) | ORAL | Status: DC

## 2014-07-21 NOTE — Assessment & Plan Note (Addendum)
Cortical/subcortical infarct in R frontal lobe R MCA distribution by MRI Jan 2016 after a 2 month suspension of Eliquis for development of heme positive stools and anemia. His LUE weakness is improving with PT and eliquis has been resumed.

## 2014-07-21 NOTE — Assessment & Plan Note (Signed)
Multifactorial , IDA and CKd.  With q 2 wkly  Checks and procrit  To maintain hgb > 10. Follow up with Dr. Ma Hillock  Lab Results  Component Value Date   HGB 9.8* 07/12/2014

## 2014-07-21 NOTE — Assessment & Plan Note (Signed)
GFR dropped during hosptialization but was back to baseline at discharge . Lab Results  Component Value Date   CREATININE 1.61* 07/06/2014

## 2014-07-21 NOTE — Assessment & Plan Note (Signed)
He does NOT have hyperlipidemia by fasting panel done at [facility] 2016.  Statin not resumed.   Lab Results  Component Value Date   CHOL 127 06/28/2014   HDL 59 06/28/2014   LDLCALC 57 06/28/2014   TRIG 56 06/28/2014

## 2014-07-21 NOTE — Assessment & Plan Note (Signed)
source is likely lower GI and EGD was negative and FOBT was positive at [facility] in Sept 2015.  He is not a candidate for colonoscopy given his age.

## 2014-07-21 NOTE — Telephone Encounter (Signed)
His recent hemoglobin was 9.8, so he should make an appt with Dr Ma Hillock for follow up.   His A1c was 7.8 which is too high. He will need to submit a list of blood sugars in two weeks so  I can review.

## 2014-07-22 NOTE — Telephone Encounter (Signed)
Left message to call office

## 2014-07-22 NOTE — Telephone Encounter (Signed)
Patient notified and voiced understanding.

## 2014-07-25 DIAGNOSIS — I350 Nonrheumatic aortic (valve) stenosis: Secondary | ICD-10-CM | POA: Diagnosis not present

## 2014-07-25 DIAGNOSIS — K922 Gastrointestinal hemorrhage, unspecified: Secondary | ICD-10-CM | POA: Diagnosis not present

## 2014-07-25 DIAGNOSIS — D649 Anemia, unspecified: Secondary | ICD-10-CM | POA: Diagnosis not present

## 2014-07-25 DIAGNOSIS — I2510 Atherosclerotic heart disease of native coronary artery without angina pectoris: Secondary | ICD-10-CM | POA: Diagnosis not present

## 2014-07-25 DIAGNOSIS — I69354 Hemiplegia and hemiparesis following cerebral infarction affecting left non-dominant side: Secondary | ICD-10-CM | POA: Diagnosis not present

## 2014-07-25 DIAGNOSIS — E1140 Type 2 diabetes mellitus with diabetic neuropathy, unspecified: Secondary | ICD-10-CM | POA: Diagnosis not present

## 2014-07-26 DIAGNOSIS — I69354 Hemiplegia and hemiparesis following cerebral infarction affecting left non-dominant side: Secondary | ICD-10-CM | POA: Diagnosis not present

## 2014-07-26 DIAGNOSIS — D649 Anemia, unspecified: Secondary | ICD-10-CM | POA: Diagnosis not present

## 2014-07-26 DIAGNOSIS — I350 Nonrheumatic aortic (valve) stenosis: Secondary | ICD-10-CM | POA: Diagnosis not present

## 2014-07-26 DIAGNOSIS — I2510 Atherosclerotic heart disease of native coronary artery without angina pectoris: Secondary | ICD-10-CM | POA: Diagnosis not present

## 2014-07-26 DIAGNOSIS — K922 Gastrointestinal hemorrhage, unspecified: Secondary | ICD-10-CM | POA: Diagnosis not present

## 2014-07-26 DIAGNOSIS — E1140 Type 2 diabetes mellitus with diabetic neuropathy, unspecified: Secondary | ICD-10-CM | POA: Diagnosis not present

## 2014-07-27 ENCOUNTER — Ambulatory Visit: Payer: Self-pay | Admitting: Internal Medicine

## 2014-07-28 ENCOUNTER — Telehealth: Payer: Self-pay | Admitting: Internal Medicine

## 2014-07-28 DIAGNOSIS — Z5181 Encounter for therapeutic drug level monitoring: Secondary | ICD-10-CM

## 2014-07-28 DIAGNOSIS — D649 Anemia, unspecified: Secondary | ICD-10-CM | POA: Diagnosis not present

## 2014-07-28 DIAGNOSIS — E785 Hyperlipidemia, unspecified: Secondary | ICD-10-CM

## 2014-07-28 DIAGNOSIS — I350 Nonrheumatic aortic (valve) stenosis: Secondary | ICD-10-CM

## 2014-07-28 DIAGNOSIS — I69354 Hemiplegia and hemiparesis following cerebral infarction affecting left non-dominant side: Secondary | ICD-10-CM | POA: Diagnosis not present

## 2014-07-28 DIAGNOSIS — E1140 Type 2 diabetes mellitus with diabetic neuropathy, unspecified: Secondary | ICD-10-CM | POA: Diagnosis not present

## 2014-07-28 DIAGNOSIS — I2510 Atherosclerotic heart disease of native coronary artery without angina pectoris: Secondary | ICD-10-CM

## 2014-07-28 DIAGNOSIS — Z794 Long term (current) use of insulin: Secondary | ICD-10-CM

## 2014-07-28 DIAGNOSIS — I10 Essential (primary) hypertension: Secondary | ICD-10-CM

## 2014-07-28 DIAGNOSIS — K922 Gastrointestinal hemorrhage, unspecified: Secondary | ICD-10-CM | POA: Diagnosis not present

## 2014-07-28 NOTE — Telephone Encounter (Signed)
Nurse has been advised as directed.

## 2014-07-28 NOTE — Telephone Encounter (Signed)
Patient has an open area to right fifth toe wound  Is 0.5 mm width and 0.6 length with depth 0.2 mm [facility] from [facility] called asking for verbal to apply calcium alginate Please advise. Just until patient can see podiatrist.

## 2014-07-28 NOTE — Telephone Encounter (Signed)
Okay for calcium algniate until pt can be seen by podiatry.

## 2014-07-29 DIAGNOSIS — K922 Gastrointestinal hemorrhage, unspecified: Secondary | ICD-10-CM | POA: Diagnosis not present

## 2014-07-29 DIAGNOSIS — I69354 Hemiplegia and hemiparesis following cerebral infarction affecting left non-dominant side: Secondary | ICD-10-CM | POA: Diagnosis not present

## 2014-07-29 DIAGNOSIS — I350 Nonrheumatic aortic (valve) stenosis: Secondary | ICD-10-CM | POA: Diagnosis not present

## 2014-07-29 DIAGNOSIS — E1140 Type 2 diabetes mellitus with diabetic neuropathy, unspecified: Secondary | ICD-10-CM | POA: Diagnosis not present

## 2014-07-29 DIAGNOSIS — D649 Anemia, unspecified: Secondary | ICD-10-CM | POA: Diagnosis not present

## 2014-07-29 DIAGNOSIS — I2510 Atherosclerotic heart disease of native coronary artery without angina pectoris: Secondary | ICD-10-CM | POA: Diagnosis not present

## 2014-08-01 DIAGNOSIS — I2510 Atherosclerotic heart disease of native coronary artery without angina pectoris: Secondary | ICD-10-CM | POA: Diagnosis not present

## 2014-08-01 DIAGNOSIS — K922 Gastrointestinal hemorrhage, unspecified: Secondary | ICD-10-CM | POA: Diagnosis not present

## 2014-08-01 DIAGNOSIS — E1140 Type 2 diabetes mellitus with diabetic neuropathy, unspecified: Secondary | ICD-10-CM | POA: Diagnosis not present

## 2014-08-01 DIAGNOSIS — D649 Anemia, unspecified: Secondary | ICD-10-CM | POA: Diagnosis not present

## 2014-08-01 DIAGNOSIS — I69354 Hemiplegia and hemiparesis following cerebral infarction affecting left non-dominant side: Secondary | ICD-10-CM | POA: Diagnosis not present

## 2014-08-01 DIAGNOSIS — I350 Nonrheumatic aortic (valve) stenosis: Secondary | ICD-10-CM | POA: Diagnosis not present

## 2014-08-04 ENCOUNTER — Telehealth: Payer: Self-pay | Admitting: *Deleted

## 2014-08-04 DIAGNOSIS — I350 Nonrheumatic aortic (valve) stenosis: Secondary | ICD-10-CM | POA: Diagnosis not present

## 2014-08-04 DIAGNOSIS — I2510 Atherosclerotic heart disease of native coronary artery without angina pectoris: Secondary | ICD-10-CM | POA: Diagnosis not present

## 2014-08-04 DIAGNOSIS — D649 Anemia, unspecified: Secondary | ICD-10-CM | POA: Diagnosis not present

## 2014-08-04 DIAGNOSIS — I69354 Hemiplegia and hemiparesis following cerebral infarction affecting left non-dominant side: Secondary | ICD-10-CM | POA: Diagnosis not present

## 2014-08-04 DIAGNOSIS — E1140 Type 2 diabetes mellitus with diabetic neuropathy, unspecified: Secondary | ICD-10-CM | POA: Diagnosis not present

## 2014-08-04 DIAGNOSIS — K922 Gastrointestinal hemorrhage, unspecified: Secondary | ICD-10-CM | POA: Diagnosis not present

## 2014-08-04 NOTE — Telephone Encounter (Signed)
Patient called for confirmation of appt and location of our practice. I called the patient and informed the pt of his appt tomorrow at 11 AM and gave them our address as well

## 2014-08-05 ENCOUNTER — Encounter: Payer: Medicare Other | Attending: Physical Medicine & Rehabilitation

## 2014-08-05 ENCOUNTER — Encounter: Payer: Self-pay | Admitting: Physical Medicine & Rehabilitation

## 2014-08-05 ENCOUNTER — Ambulatory Visit (HOSPITAL_BASED_OUTPATIENT_CLINIC_OR_DEPARTMENT_OTHER): Payer: Medicare Other | Admitting: Physical Medicine & Rehabilitation

## 2014-08-05 VITALS — BP 138/68 | HR 72 | Resp 14

## 2014-08-05 DIAGNOSIS — I482 Chronic atrial fibrillation, unspecified: Secondary | ICD-10-CM

## 2014-08-05 DIAGNOSIS — E1142 Type 2 diabetes mellitus with diabetic polyneuropathy: Secondary | ICD-10-CM | POA: Diagnosis not present

## 2014-08-05 DIAGNOSIS — G8190 Hemiplegia, unspecified affecting unspecified side: Secondary | ICD-10-CM | POA: Diagnosis not present

## 2014-08-05 DIAGNOSIS — I63411 Cerebral infarction due to embolism of right middle cerebral artery: Secondary | ICD-10-CM | POA: Diagnosis not present

## 2014-08-05 DIAGNOSIS — G8194 Hemiplegia, unspecified affecting left nondominant side: Secondary | ICD-10-CM

## 2014-08-05 NOTE — Patient Instructions (Signed)
Follow up with Opthalmologist for eye exam and clearance to drive See me in 4 wks Continue home health physical therapy with plans to refer to outpatient OT and possibly outpatient PT next month
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
## 2014-08-08 ENCOUNTER — Telehealth: Payer: Self-pay | Admitting: *Deleted

## 2014-08-08 DIAGNOSIS — I2510 Atherosclerotic heart disease of native coronary artery without angina pectoris: Secondary | ICD-10-CM | POA: Diagnosis not present

## 2014-08-08 DIAGNOSIS — I350 Nonrheumatic aortic (valve) stenosis: Secondary | ICD-10-CM | POA: Diagnosis not present

## 2014-08-08 DIAGNOSIS — I69354 Hemiplegia and hemiparesis following cerebral infarction affecting left non-dominant side: Secondary | ICD-10-CM | POA: Diagnosis not present

## 2014-08-08 DIAGNOSIS — D649 Anemia, unspecified: Secondary | ICD-10-CM | POA: Diagnosis not present

## 2014-08-08 DIAGNOSIS — E1140 Type 2 diabetes mellitus with diabetic neuropathy, unspecified: Secondary | ICD-10-CM | POA: Diagnosis not present

## 2014-08-08 DIAGNOSIS — K922 Gastrointestinal hemorrhage, unspecified: Secondary | ICD-10-CM | POA: Diagnosis not present

## 2014-08-08 NOTE — Telephone Encounter (Signed)
Wife called stating that her husband will be able to keep his appt Mar 24th, 2016

## 2014-08-09 DIAGNOSIS — E1140 Type 2 diabetes mellitus with diabetic neuropathy, unspecified: Secondary | ICD-10-CM | POA: Diagnosis not present

## 2014-08-09 DIAGNOSIS — D649 Anemia, unspecified: Secondary | ICD-10-CM | POA: Diagnosis not present

## 2014-08-09 DIAGNOSIS — I2510 Atherosclerotic heart disease of native coronary artery without angina pectoris: Secondary | ICD-10-CM | POA: Diagnosis not present

## 2014-08-09 DIAGNOSIS — I350 Nonrheumatic aortic (valve) stenosis: Secondary | ICD-10-CM | POA: Diagnosis not present

## 2014-08-09 DIAGNOSIS — I69354 Hemiplegia and hemiparesis following cerebral infarction affecting left non-dominant side: Secondary | ICD-10-CM | POA: Diagnosis not present

## 2014-08-09 DIAGNOSIS — K922 Gastrointestinal hemorrhage, unspecified: Secondary | ICD-10-CM | POA: Diagnosis not present

## 2014-08-11 DIAGNOSIS — B351 Tinea unguium: Secondary | ICD-10-CM | POA: Diagnosis not present

## 2014-08-11 DIAGNOSIS — L97512 Non-pressure chronic ulcer of other part of right foot with fat layer exposed: Secondary | ICD-10-CM | POA: Diagnosis not present

## 2014-08-11 DIAGNOSIS — E1140 Type 2 diabetes mellitus with diabetic neuropathy, unspecified: Secondary | ICD-10-CM | POA: Diagnosis not present

## 2014-08-11 DIAGNOSIS — L851 Acquired keratosis [keratoderma] palmaris et plantaris: Secondary | ICD-10-CM | POA: Diagnosis not present

## 2014-08-12 DIAGNOSIS — I69354 Hemiplegia and hemiparesis following cerebral infarction affecting left non-dominant side: Secondary | ICD-10-CM | POA: Diagnosis not present

## 2014-08-12 DIAGNOSIS — E1140 Type 2 diabetes mellitus with diabetic neuropathy, unspecified: Secondary | ICD-10-CM | POA: Diagnosis not present

## 2014-08-12 DIAGNOSIS — D649 Anemia, unspecified: Secondary | ICD-10-CM | POA: Diagnosis not present

## 2014-08-12 DIAGNOSIS — K922 Gastrointestinal hemorrhage, unspecified: Secondary | ICD-10-CM | POA: Diagnosis not present

## 2014-08-12 DIAGNOSIS — I350 Nonrheumatic aortic (valve) stenosis: Secondary | ICD-10-CM | POA: Diagnosis not present

## 2014-08-12 DIAGNOSIS — I2510 Atherosclerotic heart disease of native coronary artery without angina pectoris: Secondary | ICD-10-CM | POA: Diagnosis not present

## 2014-08-15 ENCOUNTER — Ambulatory Visit
Admit: 2014-08-15 | Disposition: A | Discharge status: Home / Self Care | Payer: Self-pay | Attending: Internal Medicine | Admitting: Internal Medicine

## 2014-08-15 DIAGNOSIS — Z8551 Personal history of malignant neoplasm of bladder: Secondary | ICD-10-CM | POA: Diagnosis not present

## 2014-08-15 DIAGNOSIS — I350 Nonrheumatic aortic (valve) stenosis: Secondary | ICD-10-CM | POA: Diagnosis not present

## 2014-08-15 DIAGNOSIS — Z794 Long term (current) use of insulin: Secondary | ICD-10-CM | POA: Diagnosis not present

## 2014-08-15 DIAGNOSIS — R0609 Other forms of dyspnea: Secondary | ICD-10-CM | POA: Diagnosis not present

## 2014-08-15 DIAGNOSIS — Z79899 Other long term (current) drug therapy: Secondary | ICD-10-CM | POA: Diagnosis not present

## 2014-08-15 DIAGNOSIS — D631 Anemia in chronic kidney disease: Secondary | ICD-10-CM | POA: Diagnosis not present

## 2014-08-15 DIAGNOSIS — N189 Chronic kidney disease, unspecified: Secondary | ICD-10-CM | POA: Diagnosis not present

## 2014-08-15 DIAGNOSIS — D509 Iron deficiency anemia, unspecified: Secondary | ICD-10-CM | POA: Diagnosis not present

## 2014-08-15 DIAGNOSIS — R531 Weakness: Secondary | ICD-10-CM | POA: Diagnosis not present

## 2014-08-15 DIAGNOSIS — I519 Heart disease, unspecified: Secondary | ICD-10-CM | POA: Diagnosis not present

## 2014-08-15 DIAGNOSIS — I272 Other secondary pulmonary hypertension: Secondary | ICD-10-CM | POA: Diagnosis not present

## 2014-08-15 DIAGNOSIS — E119 Type 2 diabetes mellitus without complications: Secondary | ICD-10-CM | POA: Diagnosis not present

## 2014-08-15 DIAGNOSIS — Z8673 Personal history of transient ischemic attack (TIA), and cerebral infarction without residual deficits: Secondary | ICD-10-CM | POA: Diagnosis not present

## 2014-08-15 DIAGNOSIS — I4891 Unspecified atrial fibrillation: Secondary | ICD-10-CM | POA: Diagnosis not present

## 2014-08-15 DIAGNOSIS — R5383 Other fatigue: Secondary | ICD-10-CM | POA: Diagnosis not present

## 2014-08-15 DIAGNOSIS — I129 Hypertensive chronic kidney disease with stage 1 through stage 4 chronic kidney disease, or unspecified chronic kidney disease: Secondary | ICD-10-CM | POA: Diagnosis not present

## 2014-08-15 DIAGNOSIS — Z951 Presence of aortocoronary bypass graft: Secondary | ICD-10-CM | POA: Diagnosis not present

## 2014-08-15 DIAGNOSIS — Z87891 Personal history of nicotine dependence: Secondary | ICD-10-CM | POA: Diagnosis not present

## 2014-08-16 DIAGNOSIS — I69354 Hemiplegia and hemiparesis following cerebral infarction affecting left non-dominant side: Secondary | ICD-10-CM | POA: Diagnosis not present

## 2014-08-16 DIAGNOSIS — D649 Anemia, unspecified: Secondary | ICD-10-CM | POA: Diagnosis not present

## 2014-08-16 DIAGNOSIS — E1140 Type 2 diabetes mellitus with diabetic neuropathy, unspecified: Secondary | ICD-10-CM | POA: Diagnosis not present

## 2014-08-16 DIAGNOSIS — K922 Gastrointestinal hemorrhage, unspecified: Secondary | ICD-10-CM | POA: Diagnosis not present

## 2014-08-16 DIAGNOSIS — I2510 Atherosclerotic heart disease of native coronary artery without angina pectoris: Secondary | ICD-10-CM | POA: Diagnosis not present

## 2014-08-16 DIAGNOSIS — I350 Nonrheumatic aortic (valve) stenosis: Secondary | ICD-10-CM | POA: Diagnosis not present

## 2014-08-19 DIAGNOSIS — D509 Iron deficiency anemia, unspecified: Secondary | ICD-10-CM | POA: Diagnosis not present

## 2014-08-19 DIAGNOSIS — I129 Hypertensive chronic kidney disease with stage 1 through stage 4 chronic kidney disease, or unspecified chronic kidney disease: Secondary | ICD-10-CM | POA: Diagnosis not present

## 2014-08-19 DIAGNOSIS — N189 Chronic kidney disease, unspecified: Secondary | ICD-10-CM | POA: Diagnosis not present

## 2014-08-19 DIAGNOSIS — D631 Anemia in chronic kidney disease: Secondary | ICD-10-CM | POA: Diagnosis not present

## 2014-08-23 DIAGNOSIS — I69354 Hemiplegia and hemiparesis following cerebral infarction affecting left non-dominant side: Secondary | ICD-10-CM | POA: Diagnosis not present

## 2014-08-23 DIAGNOSIS — I2510 Atherosclerotic heart disease of native coronary artery without angina pectoris: Secondary | ICD-10-CM | POA: Diagnosis not present

## 2014-08-23 DIAGNOSIS — I350 Nonrheumatic aortic (valve) stenosis: Secondary | ICD-10-CM | POA: Diagnosis not present

## 2014-08-23 DIAGNOSIS — E1140 Type 2 diabetes mellitus with diabetic neuropathy, unspecified: Secondary | ICD-10-CM | POA: Diagnosis not present

## 2014-08-23 DIAGNOSIS — D649 Anemia, unspecified: Secondary | ICD-10-CM | POA: Diagnosis not present

## 2014-08-23 DIAGNOSIS — K922 Gastrointestinal hemorrhage, unspecified: Secondary | ICD-10-CM | POA: Diagnosis not present

## 2014-08-26 ENCOUNTER — Telehealth: Payer: Self-pay | Admitting: *Deleted

## 2014-08-26 DIAGNOSIS — H3530 Unspecified macular degeneration: Secondary | ICD-10-CM | POA: Diagnosis not present

## 2014-08-26 NOTE — Telephone Encounter (Signed)
Samples available for eliquis 2.5 mg.

## 2014-08-26 NOTE — Telephone Encounter (Signed)
Pt calling needing samples of Eliquis they are going out for a eye apt at 1:30pm would like to get it while they are out. States he won't have enough for the weekend.

## 2014-08-26 NOTE — Telephone Encounter (Signed)
Patient notified to samples will be at the front desk.

## 2014-08-30 DIAGNOSIS — I2510 Atherosclerotic heart disease of native coronary artery without angina pectoris: Secondary | ICD-10-CM | POA: Diagnosis not present

## 2014-08-30 DIAGNOSIS — K922 Gastrointestinal hemorrhage, unspecified: Secondary | ICD-10-CM | POA: Diagnosis not present

## 2014-08-30 DIAGNOSIS — I350 Nonrheumatic aortic (valve) stenosis: Secondary | ICD-10-CM | POA: Diagnosis not present

## 2014-08-30 DIAGNOSIS — I69354 Hemiplegia and hemiparesis following cerebral infarction affecting left non-dominant side: Secondary | ICD-10-CM | POA: Diagnosis not present

## 2014-08-30 DIAGNOSIS — D649 Anemia, unspecified: Secondary | ICD-10-CM | POA: Diagnosis not present

## 2014-08-30 DIAGNOSIS — E1140 Type 2 diabetes mellitus with diabetic neuropathy, unspecified: Secondary | ICD-10-CM | POA: Diagnosis not present

## 2014-09-01 ENCOUNTER — Ambulatory Visit (HOSPITAL_BASED_OUTPATIENT_CLINIC_OR_DEPARTMENT_OTHER): Payer: Medicare Other | Admitting: Physical Medicine & Rehabilitation

## 2014-09-01 ENCOUNTER — Encounter: Payer: Medicare Other | Attending: Physical Medicine & Rehabilitation

## 2014-09-01 ENCOUNTER — Other Ambulatory Visit: Payer: Self-pay | Admitting: *Deleted

## 2014-09-01 ENCOUNTER — Encounter: Payer: Self-pay | Admitting: Physical Medicine & Rehabilitation

## 2014-09-01 VITALS — BP 147/44 | HR 61 | Resp 14

## 2014-09-01 DIAGNOSIS — G8320 Monoplegia of upper limb affecting unspecified side: Secondary | ICD-10-CM | POA: Diagnosis not present

## 2014-09-01 DIAGNOSIS — I69339 Monoplegia of upper limb following cerebral infarction affecting unspecified side: Secondary | ICD-10-CM

## 2014-09-01 DIAGNOSIS — G8190 Hemiplegia, unspecified affecting unspecified side: Secondary | ICD-10-CM | POA: Diagnosis not present

## 2014-09-01 DIAGNOSIS — I482 Chronic atrial fibrillation: Secondary | ICD-10-CM | POA: Insufficient documentation

## 2014-09-01 DIAGNOSIS — E1142 Type 2 diabetes mellitus with diabetic polyneuropathy: Secondary | ICD-10-CM | POA: Insufficient documentation

## 2014-09-01 DIAGNOSIS — E1149 Type 2 diabetes mellitus with other diabetic neurological complication: Secondary | ICD-10-CM

## 2014-09-01 DIAGNOSIS — IMO0002 Reserved for concepts with insufficient information to code with codable children: Secondary | ICD-10-CM | POA: Insufficient documentation

## 2014-09-01 DIAGNOSIS — I63411 Cerebral infarction due to embolism of right middle cerebral artery: Secondary | ICD-10-CM | POA: Diagnosis not present

## 2014-09-01 MED ORDER — INSULIN PEN NEEDLE 31G X 5 MM MISC: Status: DC

## 2014-09-01 NOTE — Patient Instructions (Addendum)
Graduated return to driving instructions were provided. It is recommended that the patient first drives with another licensed driver in an empty parking lot. If the patient does well with this, and they can drive on a quiet street with the licensed driver. If the patient does well with this they can drive on a busy street with a licensed driver. If the patient does well with this, the next time out they can go by himself. For the first month after resuming driving, I recommend no nighttime or Interstate driving.    Call me if you would like outpatient therapy at [facility]
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
## 2014-09-05 DIAGNOSIS — I2510 Atherosclerotic heart disease of native coronary artery without angina pectoris: Secondary | ICD-10-CM | POA: Diagnosis not present

## 2014-09-05 DIAGNOSIS — I69354 Hemiplegia and hemiparesis following cerebral infarction affecting left non-dominant side: Secondary | ICD-10-CM | POA: Diagnosis not present

## 2014-09-05 DIAGNOSIS — D649 Anemia, unspecified: Secondary | ICD-10-CM | POA: Diagnosis not present

## 2014-09-05 DIAGNOSIS — K922 Gastrointestinal hemorrhage, unspecified: Secondary | ICD-10-CM | POA: Diagnosis not present

## 2014-09-05 DIAGNOSIS — I350 Nonrheumatic aortic (valve) stenosis: Secondary | ICD-10-CM | POA: Diagnosis not present

## 2014-09-05 DIAGNOSIS — E1140 Type 2 diabetes mellitus with diabetic neuropathy, unspecified: Secondary | ICD-10-CM | POA: Diagnosis not present

## 2014-09-07 ENCOUNTER — Encounter: Payer: Self-pay | Admitting: Internal Medicine

## 2014-09-07 ENCOUNTER — Ambulatory Visit (INDEPENDENT_AMBULATORY_CARE_PROVIDER_SITE_OTHER): Payer: Medicare Other | Admitting: Internal Medicine

## 2014-09-07 VITALS — BP 144/82 | HR 75 | Temp 98.3°F | Resp 16 | Ht 68.75 in | Wt 220.5 lb

## 2014-09-07 DIAGNOSIS — N401 Enlarged prostate with lower urinary tract symptoms: Secondary | ICD-10-CM | POA: Diagnosis not present

## 2014-09-07 DIAGNOSIS — I63411 Cerebral infarction due to embolism of right middle cerebral artery: Secondary | ICD-10-CM

## 2014-09-07 DIAGNOSIS — R351 Nocturia: Secondary | ICD-10-CM | POA: Diagnosis not present

## 2014-09-07 DIAGNOSIS — C61 Malignant neoplasm of prostate: Secondary | ICD-10-CM | POA: Diagnosis not present

## 2014-09-07 DIAGNOSIS — D509 Iron deficiency anemia, unspecified: Secondary | ICD-10-CM | POA: Diagnosis not present

## 2014-09-07 DIAGNOSIS — D500 Iron deficiency anemia secondary to blood loss (chronic): Secondary | ICD-10-CM

## 2014-09-07 DIAGNOSIS — E1129 Type 2 diabetes mellitus with other diabetic kidney complication: Secondary | ICD-10-CM

## 2014-09-07 DIAGNOSIS — E11621 Type 2 diabetes mellitus with foot ulcer: Secondary | ICD-10-CM

## 2014-09-07 DIAGNOSIS — L97519 Non-pressure chronic ulcer of other part of right foot with unspecified severity: Secondary | ICD-10-CM

## 2014-09-07 DIAGNOSIS — E08621 Diabetes mellitus due to underlying condition with foot ulcer: Secondary | ICD-10-CM

## 2014-09-07 DIAGNOSIS — L97529 Non-pressure chronic ulcer of other part of left foot with unspecified severity: Secondary | ICD-10-CM

## 2014-09-07 DIAGNOSIS — E875 Hyperkalemia: Secondary | ICD-10-CM

## 2014-09-07 DIAGNOSIS — E1165 Type 2 diabetes mellitus with hyperglycemia: Secondary | ICD-10-CM

## 2014-09-07 DIAGNOSIS — IMO0002 Reserved for concepts with insufficient information to code with codable children: Secondary | ICD-10-CM

## 2014-09-07 DIAGNOSIS — R5383 Other fatigue: Secondary | ICD-10-CM | POA: Diagnosis not present
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
## 2014-09-07 NOTE — Patient Instructions (Addendum)
Referral to Wound Care is in progress  Keep your appt with Dr. Cleda Mccreedy for this Friday, unless the wound care sees you before that  We will make an appt with you to see Dr cope  PLEASE DROP OFF A LOG OF YOUR BLOOD SUGARS FOR THE PAST TWO WEEKS   RETURN IN MAY FOR YOUR NEXT DIABETES FOLLOW UP

## 2014-09-08 LAB — COMPREHENSIVE METABOLIC PANEL
ALK PHOS: 105 U/L (ref 39–117)
ALT: 11 U/L (ref 0–53)
AST: 17 U/L (ref 0–37)
Albumin: 3.9 g/dL (ref 3.5–5.2)
BILIRUBIN TOTAL: 0.7 mg/dL (ref 0.2–1.2)
BUN: 39 mg/dL — ABNORMAL HIGH (ref 6–23)
CO2: 25 mEq/L (ref 19–32)
Calcium: 9.4 mg/dL (ref 8.4–10.5)
Chloride: 105 mEq/L (ref 96–112)
Creatinine, Ser: 1.74 mg/dL — ABNORMAL HIGH (ref 0.40–1.50)
GFR: 39.82 mL/min — AB (ref >60.00)
GLUCOSE: 96 mg/dL (ref 70–99)
Potassium: 5.5 mEq/L — ABNORMAL HIGH (ref 3.5–5.1)
Sodium: 136 mEq/L (ref 135–145)
Total Protein: 7 g/dL (ref 6.0–8.3)

## 2014-09-08 LAB — CBC WITH DIFFERENTIAL/PLATELET
Basophils Absolute: 0 10*3/uL (ref 0.0–0.1)
Basophils Relative: 0.3 % (ref 0.0–3.0)
EOS PCT: 4.3 % (ref 0.0–5.0)
Eosinophils Absolute: 0.3 10*3/uL (ref 0.0–0.7)
HEMATOCRIT: 28.3 % — AB (ref 39.0–52.0)
HEMOGLOBIN: 9.3 g/dL — AB (ref 13.0–17.0)
LYMPHS PCT: 13 % (ref 12.0–46.0)
Lymphs Abs: 1 10*3/uL (ref 0.7–4.0)
MCHC: 32.7 g/dL (ref 30.0–36.0)
MCV: 82.2 fl (ref 78.0–100.0)
Monocytes Absolute: 0.7 10*3/uL (ref 0.1–1.0)
Monocytes Relative: 9.3 % (ref 3.0–12.0)
Neutro Abs: 5.4 10*3/uL (ref 1.4–7.7)
Neutrophils Relative %: 73.1 % (ref 43.0–77.0)
Platelets: 256 10*3/uL (ref 150.0–400.0)
RBC: 3.45 Mil/uL — AB (ref 4.22–5.81)
RDW: 17.8 % — ABNORMAL HIGH (ref 11.5–15.5)
WBC: 7.3 10*3/uL (ref 4.0–10.5)

## 2014-09-08 LAB — TSH: TSH: 3.53 u[IU]/mL (ref 0.35–4.50)

## 2014-09-08 LAB — URINALYSIS, ROUTINE W REFLEX MICROSCOPIC
BILIRUBIN URINE: NEGATIVE
HGB URINE DIPSTICK: NEGATIVE
Ketones, ur: NEGATIVE
LEUKOCYTES UA: NEGATIVE
NITRITE: NEGATIVE
Specific Gravity, Urine: 1.01 (ref 1.000–1.030)
Urine Glucose: NEGATIVE
Urobilinogen, UA: 0.2 (ref 0.0–1.0)
pH: 5.5 (ref 5.0–8.0)

## 2014-09-08 LAB — IBC PANEL
Iron: 25 ug/dL — ABNORMAL LOW (ref 42–165)
Saturation Ratios: 8.2 % — ABNORMAL LOW (ref 20.0–50.0)
Transferrin: 219 mg/dL (ref 212.0–360.0)

## 2014-09-08 LAB — FOLATE RBC

## 2014-09-08 LAB — VITAMIN B12: Vitamin B-12: 384 pg/mL (ref 211–911)

## 2014-09-08 LAB — PSA, MEDICARE: PSA: 0.48 ng/ml (ref 0.10–4.00)

## 2014-09-09 ENCOUNTER — Ambulatory Visit
Admit: 2014-09-09 | Disposition: A | Discharge status: Home / Self Care | Payer: Self-pay | Attending: Internal Medicine | Admitting: Internal Medicine

## 2014-09-09 DIAGNOSIS — K922 Gastrointestinal hemorrhage, unspecified: Secondary | ICD-10-CM | POA: Diagnosis not present

## 2014-09-09 DIAGNOSIS — I739 Peripheral vascular disease, unspecified: Secondary | ICD-10-CM | POA: Diagnosis not present

## 2014-09-09 DIAGNOSIS — I69354 Hemiplegia and hemiparesis following cerebral infarction affecting left non-dominant side: Secondary | ICD-10-CM | POA: Diagnosis not present

## 2014-09-09 DIAGNOSIS — I2510 Atherosclerotic heart disease of native coronary artery without angina pectoris: Secondary | ICD-10-CM | POA: Diagnosis not present

## 2014-09-09 DIAGNOSIS — E1140 Type 2 diabetes mellitus with diabetic neuropathy, unspecified: Secondary | ICD-10-CM | POA: Diagnosis not present

## 2014-09-09 DIAGNOSIS — D649 Anemia, unspecified: Secondary | ICD-10-CM | POA: Diagnosis not present

## 2014-09-09 DIAGNOSIS — I350 Nonrheumatic aortic (valve) stenosis: Secondary | ICD-10-CM | POA: Diagnosis not present

## 2014-09-09 DIAGNOSIS — L97512 Non-pressure chronic ulcer of other part of right foot with fat layer exposed: Secondary | ICD-10-CM | POA: Diagnosis not present

## 2014-09-10 DIAGNOSIS — L97529 Non-pressure chronic ulcer of other part of left foot with unspecified severity: Secondary | ICD-10-CM

## 2014-09-10 DIAGNOSIS — E08621 Diabetes mellitus due to underlying condition with foot ulcer: Secondary | ICD-10-CM | POA: Insufficient documentation

## 2014-09-10 LAB — URINE CULTURE: Colony Count: 40000
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
## 2014-09-10 NOTE — Assessment & Plan Note (Addendum)
Secondary to IDA and CKD.  She received an iron  Transfusion several weeks ago  But her hgb has continued to fall .  Given his age and comorbidities,  Further workup is problematic Lab Results  Component Value Date   WBC 7.3 09/07/2014   HGB 9.3* 09/07/2014   HCT 28.3* 09/07/2014   MCV 82.2 09/07/2014   PLT 256.0 09/07/2014   .

## 2014-09-10 NOTE — Assessment & Plan Note (Addendum)
He needs circulatory evaluaiton,  Offloading shoe and debridement of a pressure ulcer that has failed to improve with vtreatment bo Podiatry.  . Referral to [facility]

## 2014-09-10 NOTE — Assessment & Plan Note (Signed)
Loss of control noted by most recent A1c of 7.8 ion Feb 9.  Foot exam was abnormal today and he is up to date on eye exams.  He has been asked to reutrn a log of blood sugars in two weeks for adjustment of his insulin .  Given his age I do not favor  initiating statin therapy unless his LDL is > 100,  He is on an ARB .    Lab Results  Component Value Date   HGBA1C 7.8* 07/12/2014   No results found for: Derl Barrow  Lab Results  Component Value Date   CHOL 127 06/28/2014   HDL 59 06/28/2014   LDLCALC 57 06/28/2014   TRIG 56 06/28/2014

## 2014-09-11 NOTE — Addendum Note (Signed)
Addended by: [name] on: 09/11/2014 10:11 PM   Modules accepted: Orders

## 2014-09-12 ENCOUNTER — Other Ambulatory Visit: Payer: Self-pay | Admitting: *Deleted

## 2014-09-12 MED ORDER — FUROSEMIDE 40 MG PO TABS: 20.0000 mg | ORAL_TABLET | Freq: Every day | ORAL | Status: DC

## 2014-09-13 DIAGNOSIS — E119 Type 2 diabetes mellitus without complications: Secondary | ICD-10-CM | POA: Diagnosis not present

## 2014-09-13 DIAGNOSIS — I10 Essential (primary) hypertension: Secondary | ICD-10-CM | POA: Diagnosis not present

## 2014-09-13 DIAGNOSIS — I2510 Atherosclerotic heart disease of native coronary artery without angina pectoris: Secondary | ICD-10-CM | POA: Diagnosis not present

## 2014-09-13 DIAGNOSIS — I70261 Atherosclerosis of native arteries of extremities with gangrene, right leg: Secondary | ICD-10-CM | POA: Diagnosis not present

## 2014-09-13 DIAGNOSIS — L97409 Non-pressure chronic ulcer of unspecified heel and midfoot with unspecified severity: Secondary | ICD-10-CM | POA: Diagnosis not present

## 2014-09-13 DIAGNOSIS — L97509 Non-pressure chronic ulcer of other part of unspecified foot with unspecified severity: Secondary | ICD-10-CM | POA: Diagnosis not present

## 2014-09-14 ENCOUNTER — Other Ambulatory Visit (INDEPENDENT_AMBULATORY_CARE_PROVIDER_SITE_OTHER): Payer: Medicare Other

## 2014-09-14 DIAGNOSIS — E1165 Type 2 diabetes mellitus with hyperglycemia: Secondary | ICD-10-CM

## 2014-09-14 DIAGNOSIS — D500 Iron deficiency anemia secondary to blood loss (chronic): Secondary | ICD-10-CM | POA: Diagnosis not present

## 2014-09-14 DIAGNOSIS — IMO0002 Reserved for concepts with insufficient information to code with codable children: Secondary | ICD-10-CM

## 2014-09-14 DIAGNOSIS — E875 Hyperkalemia: Secondary | ICD-10-CM

## 2014-09-14 DIAGNOSIS — E1129 Type 2 diabetes mellitus with other diabetic kidney complication: Secondary | ICD-10-CM

## 2014-09-14 LAB — LIPID PANEL
CHOLESTEROL: 117 mg/dL (ref 0–200)
HDL: 43.6 mg/dL (ref >39.00)
LDL Cholesterol: 64 mg/dL (ref 0–99)
NONHDL: 73.4
Total CHOL/HDL Ratio: 3
Triglycerides: 48 mg/dL (ref 0.0–149.0)
VLDL: 9.6 mg/dL (ref 0.0–40.0)

## 2014-09-14 LAB — CBC WITH DIFFERENTIAL/PLATELET
Basophils Absolute: 0 10*3/uL (ref 0.0–0.1)
Basophils Relative: 0.8 % (ref 0.0–3.0)
Eosinophils Absolute: 0.3 10*3/uL (ref 0.0–0.7)
Eosinophils Relative: 5.3 % — ABNORMAL HIGH (ref 0.0–5.0)
HEMATOCRIT: 27.8 % — AB (ref 39.0–52.0)
Hemoglobin: 9.1 g/dL — ABNORMAL LOW (ref 13.0–17.0)
LYMPHS ABS: 1 10*3/uL (ref 0.7–4.0)
Lymphocytes Relative: 16.6 % (ref 12.0–46.0)
MCHC: 32.6 g/dL (ref 30.0–36.0)
MCV: 81.8 fl (ref 78.0–100.0)
Monocytes Absolute: 0.6 10*3/uL (ref 0.1–1.0)
Monocytes Relative: 10 % (ref 3.0–12.0)
Neutro Abs: 4 10*3/uL (ref 1.4–7.7)
Neutrophils Relative %: 67.3 % (ref 43.0–77.0)
PLATELETS: 291 10*3/uL (ref 150.0–400.0)
RBC: 3.4 Mil/uL — ABNORMAL LOW (ref 4.22–5.81)
RDW: 16.5 % — ABNORMAL HIGH (ref 11.5–15.5)
WBC: 6 10*3/uL (ref 4.0–10.5)

## 2014-09-14 LAB — BASIC METABOLIC PANEL
BUN: 48 mg/dL — ABNORMAL HIGH (ref 6–23)
CALCIUM: 9.3 mg/dL (ref 8.4–10.5)
CO2: 25 mEq/L (ref 19–32)
Chloride: 100 mEq/L (ref 96–112)
Creatinine, Ser: 1.93 mg/dL — ABNORMAL HIGH (ref 0.40–1.50)
GFR: 35.33 mL/min — ABNORMAL LOW (ref >60.00)
GLUCOSE: 215 mg/dL — AB (ref 70–99)
Potassium: 5.1 mEq/L (ref 3.5–5.1)
SODIUM: 132 meq/L — AB (ref 135–145)

## 2014-09-15 ENCOUNTER — Ambulatory Visit
Admit: 2014-09-15 | Disposition: A | Discharge status: Home / Self Care | Payer: Self-pay | Attending: Vascular Surgery | Admitting: Vascular Surgery

## 2014-09-15 DIAGNOSIS — I70238 Atherosclerosis of native arteries of right leg with ulceration of other part of lower right leg: Secondary | ICD-10-CM | POA: Diagnosis not present

## 2014-09-15 DIAGNOSIS — Z8673 Personal history of transient ischemic attack (TIA), and cerebral infarction without residual deficits: Secondary | ICD-10-CM | POA: Diagnosis not present

## 2014-09-15 DIAGNOSIS — L97519 Non-pressure chronic ulcer of other part of right foot with unspecified severity: Secondary | ICD-10-CM | POA: Diagnosis not present

## 2014-09-15 DIAGNOSIS — I70235 Atherosclerosis of native arteries of right leg with ulceration of other part of foot: Secondary | ICD-10-CM | POA: Diagnosis not present

## 2014-09-15 DIAGNOSIS — Z79899 Other long term (current) drug therapy: Secondary | ICD-10-CM | POA: Diagnosis not present

## 2014-09-15 DIAGNOSIS — E119 Type 2 diabetes mellitus without complications: Secondary | ICD-10-CM | POA: Diagnosis not present

## 2014-09-15 DIAGNOSIS — I10 Essential (primary) hypertension: Secondary | ICD-10-CM | POA: Diagnosis not present

## 2014-09-15 DIAGNOSIS — Z794 Long term (current) use of insulin: Secondary | ICD-10-CM | POA: Diagnosis not present

## 2014-09-15 DIAGNOSIS — I739 Peripheral vascular disease, unspecified: Secondary | ICD-10-CM | POA: Diagnosis not present

## 2014-09-15 DIAGNOSIS — L97511 Non-pressure chronic ulcer of other part of right foot limited to breakdown of skin: Secondary | ICD-10-CM | POA: Diagnosis not present

## 2014-09-16 ENCOUNTER — Encounter: Payer: Self-pay | Admitting: *Deleted

## 2014-09-16 ENCOUNTER — Ambulatory Visit
Admit: 2014-09-16 | Disposition: A | Discharge status: Home / Self Care | Payer: Self-pay | Attending: Internal Medicine | Admitting: Internal Medicine

## 2014-09-23 DIAGNOSIS — L97512 Non-pressure chronic ulcer of other part of right foot with fat layer exposed: Secondary | ICD-10-CM | POA: Diagnosis not present

## 2014-09-23 DIAGNOSIS — I739 Peripheral vascular disease, unspecified: Secondary | ICD-10-CM | POA: Diagnosis not present

## 2014-09-23 DIAGNOSIS — M86171 Other acute osteomyelitis, right ankle and foot: Secondary | ICD-10-CM | POA: Diagnosis not present

## 2014-09-26 ENCOUNTER — Telehealth: Payer: Self-pay

## 2014-09-26 NOTE — Telephone Encounter (Signed)
The patient called and wanted to confirm that a faxed document was received by Juliann Pulse.

## 2014-09-26 NOTE — Telephone Encounter (Signed)
Patient needs surgical clearance for toe placed form in red folder I can fill out vitals if most current visit is ok?

## 2014-09-27 ENCOUNTER — Telehealth: Payer: Self-pay | Admitting: Internal Medicine

## 2014-09-27 NOTE — Telephone Encounter (Signed)
I have filled out form but need to print out last office note and send with form back to podiatry when  Return this afternoon

## 2014-09-27 NOTE — Telephone Encounter (Signed)
Form has been faxed as requested to [facility]. Patient notified clearance has been faxed as requested.

## 2014-09-28 ENCOUNTER — Ambulatory Visit: Admit: 2014-09-28 | Disposition: A | Discharge status: Home / Self Care | Payer: Self-pay | Admitting: Podiatry

## 2014-09-28 ENCOUNTER — Telehealth: Payer: Self-pay | Admitting: *Deleted

## 2014-09-28 DIAGNOSIS — Z01812 Encounter for preprocedural laboratory examination: Secondary | ICD-10-CM | POA: Diagnosis not present

## 2014-09-28 DIAGNOSIS — E1140 Type 2 diabetes mellitus with diabetic neuropathy, unspecified: Secondary | ICD-10-CM | POA: Diagnosis not present

## 2014-09-28 DIAGNOSIS — I739 Peripheral vascular disease, unspecified: Secondary | ICD-10-CM | POA: Diagnosis not present

## 2014-09-28 DIAGNOSIS — M86171 Other acute osteomyelitis, right ankle and foot: Secondary | ICD-10-CM | POA: Diagnosis not present

## 2014-09-28 DIAGNOSIS — M869 Osteomyelitis, unspecified: Secondary | ICD-10-CM | POA: Diagnosis not present

## 2014-09-28 LAB — CBC WITH DIFFERENTIAL/PLATELET
BASOS ABS: 0.1 10*3/uL (ref 0.0–0.1)
BASOS PCT: 1.3 %
Eosinophil #: 0.4 10*3/uL (ref 0.0–0.7)
Eosinophil %: 5.7 %
HCT: 23.7 % — ABNORMAL LOW (ref 40.0–52.0)
HGB: 7.6 g/dL — AB (ref 13.0–18.0)
LYMPHS PCT: 14.7 %
Lymphocyte #: 0.9 10*3/uL — ABNORMAL LOW (ref 1.0–3.6)
MCH: 26.6 pg (ref 26.0–34.0)
MCHC: 32.2 g/dL (ref 32.0–36.0)
MCV: 83 fL (ref 80–100)
Monocyte #: 0.6 x10 3/mm (ref 0.2–1.0)
Monocyte %: 9 %
NEUTROS PCT: 69.3 %
Neutrophil #: 4.3 10*3/uL (ref 1.4–6.5)
Platelet: 264 10*3/uL (ref 150–440)
RBC: 2.87 10*6/uL — ABNORMAL LOW (ref 4.40–5.90)
RDW: 17 % — AB (ref 11.5–14.5)
WBC: 6.3 10*3/uL (ref 3.8–10.6)

## 2014-09-28 LAB — POTASSIUM: Potassium: 4.4 mmol/L

## 2014-09-28 NOTE — Telephone Encounter (Signed)
Ok to proceed ,  He has a chronic anemia,  And this  is a drop,  But his hematologist does not plan to do anything unles it is < 7.0

## 2014-09-28 NOTE — Telephone Encounter (Signed)
Kendalyn from Dr. Sherren Mocha Cline's office, [place], left VM. States pt scheduled for foot surgery tomorrow. The hospital called them to report pt's hemoglobin 7.6, wanted to know if ok to proceed with surgery. States Dr. Cleda Mccreedy states blood loss should be minimum.

## 2014-09-28 NOTE — Telephone Encounter (Signed)
[place] notified

## 2014-09-29 ENCOUNTER — Ambulatory Visit
Admit: 2014-09-29 | Disposition: A | Discharge status: Home / Self Care | Payer: Self-pay | Attending: Podiatry | Admitting: Podiatry

## 2014-09-29 DIAGNOSIS — M869 Osteomyelitis, unspecified: Secondary | ICD-10-CM | POA: Diagnosis not present

## 2014-09-29 DIAGNOSIS — Z8551 Personal history of malignant neoplasm of bladder: Secondary | ICD-10-CM | POA: Diagnosis not present

## 2014-09-29 DIAGNOSIS — M1990 Unspecified osteoarthritis, unspecified site: Secondary | ICD-10-CM | POA: Diagnosis not present

## 2014-09-29 DIAGNOSIS — Z794 Long term (current) use of insulin: Secondary | ICD-10-CM | POA: Diagnosis not present

## 2014-09-29 DIAGNOSIS — I2510 Atherosclerotic heart disease of native coronary artery without angina pectoris: Secondary | ICD-10-CM | POA: Diagnosis not present

## 2014-09-29 DIAGNOSIS — Z79899 Other long term (current) drug therapy: Secondary | ICD-10-CM | POA: Diagnosis not present

## 2014-09-29 DIAGNOSIS — Z951 Presence of aortocoronary bypass graft: Secondary | ICD-10-CM | POA: Diagnosis not present

## 2014-09-29 DIAGNOSIS — Z880 Allergy status to penicillin: Secondary | ICD-10-CM | POA: Diagnosis not present

## 2014-09-29 DIAGNOSIS — I10 Essential (primary) hypertension: Secondary | ICD-10-CM | POA: Diagnosis not present

## 2014-09-29 DIAGNOSIS — H3530 Unspecified macular degeneration: Secondary | ICD-10-CM | POA: Diagnosis not present

## 2014-09-29 DIAGNOSIS — I4891 Unspecified atrial fibrillation: Secondary | ICD-10-CM | POA: Diagnosis not present

## 2014-09-29 DIAGNOSIS — M86171 Other acute osteomyelitis, right ankle and foot: Secondary | ICD-10-CM | POA: Diagnosis not present

## 2014-09-29 DIAGNOSIS — Z89429 Acquired absence of other toe(s), unspecified side: Secondary | ICD-10-CM | POA: Diagnosis not present

## 2014-09-29 DIAGNOSIS — I739 Peripheral vascular disease, unspecified: Secondary | ICD-10-CM | POA: Diagnosis not present

## 2014-09-29 DIAGNOSIS — Z8673 Personal history of transient ischemic attack (TIA), and cerebral infarction without residual deficits: Secondary | ICD-10-CM | POA: Diagnosis not present

## 2014-09-29 DIAGNOSIS — E119 Type 2 diabetes mellitus without complications: Secondary | ICD-10-CM | POA: Diagnosis not present

## 2014-09-29 DIAGNOSIS — Z888 Allergy status to other drugs, medicaments and biological substances status: Secondary | ICD-10-CM | POA: Diagnosis not present

## 2014-09-29 DIAGNOSIS — M868X7 Other osteomyelitis, ankle and foot: Secondary | ICD-10-CM | POA: Diagnosis not present

## 2014-09-30 NOTE — Op Note (Signed)
PATIENT NAME:  [name], [name] MR#:  [id number] DATE OF BIRTH:  02-Oct-1927  DATE OF PROCEDURE:  12/22/2012  PRINCIPAL DIAGNOSES: Right ureteral neoplasm, uncertain; right hydroureter.   POSTOPERATIVE DIAGNOSES: Right ureteral stricture; bilateral hydroureter; bladder tumor.   PROCEDURES: Bilateral ureteroscopy with bilateral retrograde pyelogram, cystoscopy, bladder biopsy, mitomycin bladder instillation.   SURGEON: Edrick Oh, M.D.   ANESTHESIA: Laryngeal Mask Airway Anasthesia.   INDICATION: The patient is an 81 year old white gentleman with recent progressive left flank discomfort. He also has a history of prostate cancer. His PSA indicates no evidence of prostate cancer progression. Further evaluation with CT scan evaluation demonstrated fullness and irregularity in the mid to lower left ureter just below the level of the crossing vessels suspicious for possible tumor. He presents for ureteroscopy with biopsy and treatment as indicated.   PROCEDURE: After informed consent was obtained, the patient was taken to the operating room and placed in the dorsal lithotomy position under laryngeal mask airway anesthesia. The patient was then prepped and draped in the usual standard fashion. The 22-French rigid cystoscope was introduced into the urethra under direct vision with no urethral abnormalities noted. Upon entering the prostatic fossa, moderate bilobar prostatic hypertrophy was noted with partial visual obstruction. The prostate fossa was noted to be relatively short. This is consistent with treatment for his prostate cancer. Upon entering the bladder, the mucosa was inspected in its entirety; 3+ trabeculation was noted throughout. There were 2 separate areas of irregularity within the bladder. On the right lateral wall, there was an approximate 1 cm area of papillary-appearing tumor with an approximate 3 cm area of mucosal field changes with raised, irregular, punctate, papillary-appearing tumors.  There was a separate area on the posterior bladder wall approximately 2 to 2.5 cm in size with early papillary-appearing features. The remainder of the bladder demonstrated no significant abnormalities. The left ureteral orifice was noted to be somewhat posterior and lateral. It was fairly pinpoint in nature. The right ureteral orifice was initially not identified. With further evaluation, it was found to be within some of the papillary field change on the right. It was also very punctate in nature. Cold cup biopsy forceps were then utilized to obtain biopsies from the right lateral wall and posterior bladder wall. Multiple biopsies were obtained. These were sent to pathology for further evaluation. The areas were then extensively cauterized, including the area around the right ureteral orifice due to the degree of change. A flexible-tip guidewire was introduced into the left ureteral orifice. It was easily advanced into the upper pole collecting system without difficulty. A 6-French open-ended catheter was inserted over the guidewire. The guidewire was then removed after the open-ended catheter was approximately 2 cm into the ureteral orifice. A retrograde pyelogram was performed. This demonstrated an approximate 5 to 6 cm area of prominent dilation that appeared to be in 2 separate sections just below the level of the crossing vessels in the mid to lower ureter. The proximal ureter demonstrated minimal dilation. No definitive filling defects were appreciated. The guidewire was readvanced through the open-ended catheter. The open-ended catheter was removed. Subsequent imaging demonstrated prompt drainage of the proximal ureter. There was some retained contrast within the dilated portions of the mid to lower ureter. The cystoscope was removed. The 6-French rigid ureteroscope was advanced into the urinary bladder. A second guidewire was utilized to help navigate insertion of the ureteroscope into the left ureteral  orifice due to its size. The scope was advanced through the orifice with  minimal difficulty. Approximately 1 cm into the orifice and ureter, a tight band of tissue was encountered. With proper orientation and use of the second guidewire, the scope was able to be passed through this region. No evidence of tumor was noted at this site. A dilated portion of the ureter was then encountered. A second slight area of narrowing was encountered. The scope was easily advanced through this area with a second dilated portion of the ureter noted. The scope was then passed over the crossing vessels to the level of the ureteropelvic junction. There was no evidence of tumor throughout the entire ureter. The areas of stricture did not appear to be overly significant due to the prompt drainage of contrast except in the more dilated portions. The decision was made at this point not to place a stent. The ureter was re-examined upon withdrawal of the scope with no additional abnormalities noted. The cystoscope was replaced into the urinary bladder. The guidewire was advanced through the open-ended catheter. This was used for stabilization. The area of cauterization around the right ureteral orifice was identified. The open-ended catheter was inserted through the site. The guidewire was advanced into the right ureteral orifice. It was advanced into the upper pole collecting system without further difficulty. The 6-French open-ended catheter was then inserted over the guidewire. A retrograde pyelogram was performed demonstrating moderate dilation of the mid to lower ureter in similar proximity to the left ureteral dilation. The ureter demonstrated no definitive filling defects. Upon withdrawal of the open-ended catheter and replacement of the guidewire, the contrast was noted to drain promptly even in the areas of dilation in the distal ureter. The decision was made to proceed with further evaluation under direct visualization due to the  abnormalities noted, and the cystoscope was removed. The 6-French rigid ureteroscope was advanced into the urinary bladder. A guidewire was utilized through the scope to help navigate through the small ureteral orifice. With the insertion of the second guidewire, the orifice was noted to be relatively flexible and adequate. The scope was advanced through the orifice into the ureter. Once again, a small area of stenosis was noted. The scope was easily passed through this area. It was advanced to the level of the ureteropelvic junction with no mucosal abnormalities noted throughout the ureter. There was no evidence of tumor noted within the ureter despite its close proximity around the ureteral orifice. Based on the need for cauterization around the ureteral orifice, the decision was made for stent placement. The ureteroscope was removed, with the guidewire left in place. The 22-French rigid cystoscope was readvanced over the guidewire. A 6-French x 26 cm double-J ureteral stent was advanced over the guidewire into the upper pole collecting system. Adequate curl was noted within the renal pelvis. Upon withdrawal of the guidewire, adequate curl was also noted within the urinary bladder. The bladder was drained. The cystoscope was removed. Mitomycin 20 mg was reconstituted in 50 mL of sterile saline. An 18-French Foley catheter was inserted into the urinary bladder. The mitomycin was then instilled. The catheter was removed. Proper chemotherapy protocols were then followed. The patient was returned to the supine position and awakened from laryngeal mask airway anesthesia. He was taken to the recovery room in stable condition. There were no problems or complications. The patient tolerated the procedure well. Estimated blood loss was minimal. The areas previously biopsied had been extensively cauterized utilizing a Bugbee electrode, with no residual tumor noted in these areas. The biopsies were sent to pathology  for  further evaluation. The patient overall tolerated the procedure well.   ____________________________ Denice Bors. Jacqlyn Larsen, MD bsc:gb D: 12/23/2012 21:11:39 ET T: 12/23/2012 21:42:01 ET JOB#: [id number]  cc: Denice Bors. Jacqlyn Larsen, MD, <Dictator> Denice Bors Antwaine Boomhower MD ELECTRONICALLY SIGNED 12/24/2012 10:17

## 2014-09-30 NOTE — Consult Note (Signed)
PATIENT NAME:  [name], [name] MR#:  [id number] DATE OF BIRTH:  [birth year]  DATE OF CONSULTATION:  08/09/2012  REFERRING PHYSICIAN:  Arman Filter, MD  CONSULTING PHYSICIAN:  [place] Sink, MD PRIMARY CARE PHYSICIAN: Hewitt Blade. Sarina Ser, MD  PRIMARY CARDIOLOGIST: Dr. Martinique at [facility] in [place]: Presyncopal episode/near syncope.   HISTORY OF PRESENT ILLNESS: The patient is a very nice 81 year old gentleman who has history of coronary artery disease, aortic stenosis, status post porcine aortic valve replacement in 2009, diabetes, peptic ulcer disease, prostate cancer, hypertension and hyperlipidemia. The patient presents today with a history of waking up in the morning without problems, getting up to pick up his paper, sitting down at the coffee table to have some cereal for breakfast, some blueberries, without any problems. After he finished breakfast, he went to urinate; and whenever he was in the bathroom he started getting dizzy. He grabbed the wall, and he went back to the living area where he sat down, and at that moment he was not able to focus very well, and he had some blurry vision. All of that episode lasted for about 30 minutes. The patient never had chest pain, never had headache, never had shortness of breath. EMS arrived after the family called. His blood pressure was 194/90. His blood sugar was 150. The patient was brought to the ER where he was evaluated. I was asked to admit the patient by Dr. Arman Filter, but after a long talk with his son, who is a cardiologist for [facility] clinic, Dr. Acie Fredrickson, he said that he feels confident that the patient could go home and be observed right there. On my evaluation, the patient does not have any significant changes on EKG, laboratory work or vital signs that would merit a full admission. He could be observed, but with the confidence of the family and the confidence of the patient having a close followup, I  think we can send him home today, and he will be safe to there.   REVIEW OF SYSTEMS:  CONSTITUTIONAL: Denies any fever, fatigue, weakness, shortness of breath. Denies any changes in his vision up until this morning whenever he had double vision, but overall he has no changes in his eyesight, redness or inflammation.  ENT: No tinnitus. No postnasal drip. No nasal discharge. No difficulty swallowing.  RESPIRATORY: No cough. No shortness of breath. No wheezing. No painful respirations.  CARDIOVASCULAR: No chest pain, no orthopnea.  The patient has occasional edemas, but they are being well controlled. No palpitations. The patient does have atrial fibrillation, and her heart rate is usually well-controlled around the 60s. No previous syncopal episodes. Today he did not have syncope, it was just near syncope and lightheadedness.  GASTROINTESTINAL:  No nausea, vomiting, abdominal pain, constipation or diarrhea. He has history of GI bleeding in the past for which he has received transfusions for multiple months. He received Procrit, and his hemoglobin right now is around 10.0.  GENITOURINARY: No dysuria, hematuria or changes in frequency.  ENDOCRINE: No polyuria, polydipsia, or polyphagia. No cold or heat intolerance. HEMATOLOGIC/LYMPHATIC: No significant changes on his anemia.  Actually, it is being well controlled. No bleeding or swollen glands.  SKIN: No rashes or petechiae.  MUSCULOSKELETAL: No significant neck pain, back pain, swelling joints or gout.  NEUROLOGIC: No numbness, tingling. At this moment, the patient does have history of peripheral neuropathy, but it has been well controlled. No CVAs. No transient ischemic attacks.  PSYCHIATRIC: No depression  or anxiety.   PAST MEDICAL HISTORY: 1. Coronary artery disease.  2. Status post single vessel bypass graft in 2009.  3. Aortic stenosis, status post porcine aortic valve replacement.  4. Atrial fibrillation, chronic.  5. Type 2 insulin-dependent  diabetes.  6. Diabetic neuropathy.  7. Prostate cancer.  8. Hypertension.  9. Hyperlipidemia.  10. Peptic ulcer disease.  11. History of GI bleeding due to AVMs.   ALLERGIES: THE PATIENT IS ALLERGIC TO PENICILLIN. THE PATIENT STATES THAT HE HAS A RASH WITH PENICILLIN.   SOCIAL HISTORY: The patient is married and lives with his wife. He is retired. He used to drink beer occasionally, but he has not in a while. He used to smoke a pipe, but it has been years since he quit it.   FAMILY HISTORY: The patient states that his parents died from old age in their 69s. They were healthy. History of prostate cancer in his brother and also renal cell carcinoma.   CURRENT MEDICATIONS: The patient takes Lupron shots every 6 months, vitamin D 1000 units daily, vitamin C 500 mg daily, stool softener daily, Protonix 40 mg once daily, multivitamins once daily, Lasix 20 mg once daily, Lantus 50 units subcutaneously every morning, Humalog sliding scale, ferrous sulfate 325 mg daily, doxazosin 2 mg once daily, amlodipine 5 mg once daily.   PHYSICAL EXAMINATION: VITAL SIGNS: Blood pressure 172/93, pulse 65, respiratory rate 20, temperature 97.9, oxygen saturation 100% on room air.  CONSTITUTIONAL: Alert, oriented x 3. No acute respiratory distress. Hemodynamically stable.  HEENT: Pupils are equal and reactive. Extraocular movements are intact. Mucosa are moist. Anicteric sclerae. Pink conjunctivae. No oral lesions. No oropharyngeal exudates.  NECK: Supple. No JVD. No thyromegaly. No adenopathy. No carotid bruits. No rigidity.  CARDIOVASCULAR: Irregularly irregular rhythm, but no murmurs, rubs, or gallops are appreciated. The patient has chronic atrial fibrillation. His heart rate is in the 60s. No displacement of PMI. No tenderness to palpation to anterior wall.  LUNGS: Clear without any wheezing or crepitus. No crackles. No use of accessory muscles.  ABDOMEN: Soft, nontender, nondistended. No hepatosplenomegaly. No  masses. Bowel sounds are positive.  EXTREMITIES: No edema at this moment. No cyanosis, no clubbing. Pulses +2. Capillary refill less than 3 seconds.  MUSCULOSKELETAL: No significant lesions or swollen joints. No signs of gout.  NEUROLOGICAL: Cranial nerves II through XII are intact. Strength is 5/5 in all 4 extremities. Sensation is normal in 4 extremities. Cerebellar tests are overall normal.  Mood is normal without any signs of anxiety or depression.  LYMPHATIC: Negative for lymphadenopathy in the neck or supraclavicular areas.   LABORATORY AND RADIOLOGICAL DATA:  Creatinine 1.41, which is chronic, glucose 120, BUN 31. His glucose dropped to 67, but now he had a meal and he feels better. Potassium 4.4. LFTs within normal limits. Troponin 0.02. TSH 3.08. White count is 6.1, hemoglobin 10.9, platelets 233. Urinalysis without any signs of infection. No white blood cells seen.   EKG: Compared with previous, it has not changed. He has left bundle branch block which is chronic. He has atrial fibrillation with a well-controlled rate. CT of the head was done without any acute intracranial process. There is some periventricular white matter low attenuation secondary to microangiopathy but no acute changes.   ASSESSMENT AND PLAN: An 81 year old gentleman with coronary artery disease, diabetes, hypertension, prostate cancer, peptic ulcer disease, hyperlipidemia and replacement of aortic valve who comes with flash episodes of blurry vision, a presyncopal episode but no full syncope.  1. Presyncopal episode: Likely vasovagal postural changes. There are no signs of neurologic issues, no signs of cardiovascular issues. His cardiac enzymes are negative. His EKG has no significant changes. There is no major concern about medication management. His blood sugars have been okay, and his blood sugar was 150 when he was checked with EMS. At this moment, his blood sugar dropped a little bit to 65, but it was because the  patient was not eating. He had a meal, and he is feeling very well. I had a long discussion with Dr. Acie Fredrickson, who is his son.  He is a cardiologist for [facility], and he feels competent that he can go home and be watched. The patient wants to go home, and I agree at this moment with that assessment. The patient is going to follow up closely with Dr. Lisette Grinder and Dr. Martinique, Cardiology. He might benefit from getting a repeat echocardiogram, although again this does not seem to be a cardiac issue at the moment. For his other medical issues, he has been stable.  2. Diabetes: Continue his treatment for his diabetes with Lantus and Humalog.  3. Aortic stenosis: Probably just repeating echo will be sufficient.  I don't think he needs an ultrasound of the carotid arteries at this moment.  4. Peptic ulcer disease and GI bleeding:  His hemoglobin is actually 10. He has been receiving in the past Procrit, but at this moment he does not need any. His blood pressure has been elevated. The patient has been compliant with medications but apparently he has been eating more salt and having his blood pressure increasing slowly for which he is going to just follow up with Dr. Gilford Rile. The patient is going to be discharged.   TIME SPENT: I spent about 50 minutes with this  patient.   Recommendations have been given. I spoke with Dr. Renard Hamper, who is going to let the patient go home.  ____________________________ [place] Sink, MD rsg:cb D: 08/09/2012 15:32:33 ET T: 08/09/2012 15:51:56 ET JOB#: [id number]  cc: [place] Sink, MD, <Dictator> ROBERTO America Brown MD ELECTRONICALLY SIGNED 08/25/2012 12:50

## 2014-09-30 NOTE — Consult Note (Signed)
PATIENT NAME:  [name], [name] MR#:  [id number] DATE OF BIRTH:  [birth year]  UROLOGY CONSULATION  DATE OF CONSULTATION:  03/04/2013  CONSULTING PHYSICIAN:  Denice Bors. Jacqlyn Larsen, MD  HISTORY: Mr. Gartman is a well-known patient with a recent diagnosis of bladder cancer. He has been undergoing BCG therapy for the bladder cancer. He received his final course of BCG therapy yesterday.   Last evening he developed a temperature to 99 degrees. He had moderate arthralgia, with some nausea and vomiting. He presented to the Emergency Room for further evaluation. He was hospitalized for more aggressive treatment. This was felt to be a fairly severe response. This, however, is a fairly mild response that indicates an immune system response to the BCG therapy. People who have more side-effects to a point often have a better overall response to the medication. This is something that is usually just treated with nonsteroidal medications with his renal function status. Tylenol would be recommended. He has been started on antibiotic therapy. This is certainly reasonable for continued use for approximately 5 days.   The white blood cells are directly related bacteria that has been instilled into the urinary bladder. Temperatures of one hundred 101.5 or greater are of most concern. No steroid therapy or other intervention is indicated at present. His symptoms have currently resolved. He has tolerated morning breakfast without difficulty. He has remained afebrile. Vital signs stable throughout his  his observation. He has overall done very well through the course of treatment.   PAST MEDICAL HISTORY: Significant for coronary artery disease, aortic valvular disease,  paroxysmal atrial fibrillation, type 2 diabetes, prostate cancer, bladder cancer.   PAST SURGICAL HISTORY: Significant for CABG, aortic valve replacement, cystoscopy, bladder biopsy.   ALLERGIES: METOPROLOL AND PENICILLIN.   SOCIAL HISTORY: The patient relates  occasional alcohol use. He denies any significant tobacco or drug use.   MEDICATIONS ON ADMISSION: Norvasc 5 mg daily, Coreg 6.25 mg twice daily, Colace  100 mg daily, Feosol 325 mg daily, Humalog insulin, sliding-scale. Lantus 50 units subcutaneous in the morning, Lasix 20 mg daily, multivitamin 1 tablet daily, PreserVision  1 tablet twice daily, Protonix 40 mg daily, vitamin C 500 mg daily, vitamin D3  1000 international units daily.   PHYSICAL EXAMINATION: VITAL SIGNS: Vital signs stable.  HEENT: Within normal limits.  CHEST: Clear to auscultation bilaterally.  CARDIOVASCULAR: Irregular rate and rhythm.  ABDOMEN: Soft, nontender, nondistended. No palpable masses. No appreciable CVA tenderness.  GENITOURINARY: External genitalia within normal limits.  EXTREMITIES: Free range of motion x 4.  NEUROLOGIC: Motor and sensory grossly intact.   ASSESSMENT: Mild bacille Calmette-Guerin reaction; bladder cancer.   RECOMMENDATIONS: No aggressive intervention is indicated at present. This is usually treated with nonsteroidal medications, as stated above. A few additional doses of Tylenol may be all that are indicated. It is certainly reasonable to continue the antibiotic therapy orally for approximately 5 days.   He is otherwise to follow up as originally scheduled for his next cystoscopy. No other interventions are indicated.   If there are any further questions, please free to contact us.     ____________________________ Denice Bors. Jacqlyn Larsen, MD bsc:dm D: 03/04/2013 11:06:08 ET T: 03/04/2013 11:31:53 ET JOB#: [id number]  cc: Denice Bors. Jacqlyn Larsen, MD, <Dictator> Denice Bors Datrell Dunton MD ELECTRONICALLY SIGNED 03/05/2013 18:04

## 2014-09-30 NOTE — Consult Note (Signed)
Patient seen, chart reviewed, note dictated.  Assessment: Bladder cancer, mild BCG side effect  Recommendation: This is not an atypical response to BCG therapy.  This is not a severe reaction that would require steroids or other aggressive treatment.  This is considered a fairly mild side effect.  The antibiotics are reasonable for approximately 5 days.  This is something that is usually just treated with oral NSAIDs.  Given his renal function status, Tylenol is all that is recommended.  Usually just a few doses will resolve the issue.  This is actually a good sign that he is having good immune system response to the treatment.  People who have more side effects related to the treatment often have better outcomes.  The only change that we would consider with his maintenance therapy in the future is to decrease the overall dose of administration.  Premedication with Tylenol will also be recommended.  Air is no other contraindication at this point.  No other therapy is indicated at present. Okay for discharge at any time.  If there are any further questions, please contact us for discussion.  Electronic Signatures: Murrell Redden (MD)  (Signed on 25-Sep-14 10:59)  Authored  Last Updated: 25-Sep-14 10:59 by Murrell Redden (MD)

## 2014-09-30 NOTE — H&P (Signed)
PATIENT NAME:  [name], [name] MR#:  [id number] DATE OF BIRTH:  [birth year]  DATE OF ADMISSION:  03/03/2013  REFERRING PHYSICIAN: Dr. Hinda Kehr.   PRIMARY CARE PHYSICIAN: Dr. Lisette Grinder III  Chief Complaint: chills and knee pain.    HISTORY OF PRESENT ILLNESS: [name] is an 81 year old Caucasian gentleman with a past medical history of coronary artery disease status post CABG, aortic valve replacement, paroxysmal atrial fibrillation, type 2 diabetes which is insulin requiring, as well as prostate and bladder cancer currently on BCG treatment by Dr. Jacqlyn Larsen who is presenting today with generalized symptoms. He describes a few hours after finishing his final (6th) BCG dosage for bladder cancer having chills, arthralgias of the knees and ankles bilaterally followed by nausea and emesis once which was nonbloody and nonbilious. He finished his BCG treatment. Was at home sitting in his chair when he felt acutely chilled. He used an Arboriculturist which relieved his symptoms. Then, he went to the restroom about 1 hour later and noticed he had leg pain in his knees and ankle joints, 3 to 4 out of 10 in intensity, dull aching sensation which was nonradiating. No relieving factors. Worsened by movements. He decided to present to the Emergency Department for further workup and evaluation after calling his son who agreed with that plan.   REVIEW OF SYSTEMS:  CONSTITUTIONAL: Currently denies any fevers. Complained of chills as above. Denies fatigue, weakness or pain or weight loss.  EYES: Denies blurred vision or eye pain.  ENT: Denies ear pain or discharge or dysphagia.  RESPIRATORY: Denies cough, wheeze, shortness of breath.  CARDIOVASCULAR: Denies chest pain, palpitations, lower extremity edema.  GASTROINTESTINAL: Nausea and vomiting as above. Denies any abdominal pain.  GENITOURINARY: Denies any dysuria, hematuria or change in urinary frequency.  ENDOCRINE: Denies polyuria, thyroid problems, heat or  cold intolerance.  HEMATOLOGIC AND LYMPHATIC: Denies easy bruising or bleeding.  SKIN: Denies any rashes or lesions.  MUSCULOSKELETAL: Arthralgias as above. Denies any other pain in the neck, back or shoulders.  NEUROLOGIC: Denies any paralysis or paresthesias.  PSYCHIATRIC: Denies any anxiety or depressive symptoms.   Otherwise, full review of systems performed by me is negative.   PAST MEDICAL HISTORY: Coronary artery disease status post CABG, aortic valve replacement, paroxysmal atrial fibrillation, type 2 diabetes requiring insulin, prostate cancer, bladder cancer.   SOCIAL HISTORY: Occasional alcohol usage. Denies any tobacco usage. Denies any drug usage. Lives with his wife. He is fully functional at baseline and independent for activities of daily living.   FAMILY HISTORY: Positive for prostate as well as bladder cancer. His brother died of the above conditions.   ALLERGIES: METOPROLOL AND PENICILLIN.   HOME MEDICATIONS: Norvasc 5 mg p.o. daily, Coreg 6.25 mg p.o. b.i.d., Colace 100 mg p.o. daily, Feosol 325 mg p.o. daily, Humalog sliding scale before meals and at bedtime, Lantus 50 units subcutaneous injection q.a.m., Lasix 20 mg p.o. daily, multivitamin 1 tab p.o. daily, PreserVision 1 tab p.o. b.i.d., Protonix 40 mg p.o. daily, vitamin C 500 mg p.o. daily, vitamin D3 1000 international units p.o. daily.   PHYSICAL EXAMINATION:  VITAL SIGNS: Temperature 99.8 degrees Fahrenheit, heart rate 91 in atrial fibrillation, respirations 20, blood pressure 132/62, saturating 100% on room air.  GENERAL: Well-nourished, obese gentleman in no acute distress.  HEAD: Normocephalic, atraumatic.  EYES: Pupils equal, round, reactive to light as well as accommodation. Extraocular muscles intact. No scleral icterus.  MOUTH: Moist mucosal membranes. Dentition is intact. No abscesses.  EARS,  NOSE AND THROAT: Throat is clear without exudate. No external lesions.  NECK: Supple. No thyromegaly or nodules  appreciated. No JVD.  CARDIOVASCULAR: S1, S2, irregular rate, irregular rhythm consistent with atrial fibrillation. No murmurs, rubs or gallops. No edema. Pedal pulses 2+ bilaterally.  PULMONARY: Clear to auscultation bilaterally without wheezes, rubs or rhonchi. No use of accessory muscles. Good respiratory effort.  CHEST: Nontender palpation.  GASTROINTESTINAL: Obese, soft, nontender, nondistended. No masses palpated. No hepatosplenomegaly. Positive bowel sounds.  MUSCULOSKELETAL: No edema, cyanosis or clubbing. Full range of motion in all extremities.  NEUROLOGIC: Cranial nerves II through XII intact. No gross focal neurological deficits. Sensation intact. Reflexes intact.  SKIN: No ulcerations or lesions or rashes. Skin is warm and dry. Turgor is intact.  PSYCHIATRIC: Mood and affect are within normal limits. He is alert, oriented x 3. Insight and judgment intact.   LABORATORY DATA: Sodium 134, potassium 4, chloride 102, bicarb 24, BUN 38, creatinine 1.68, glucose 136. Troponin I less than 0.02, CK 78, CK-MB 1.1. WBC 11.1, hemoglobin 11.1, platelets 249. Urinalysis 3+ blood, protein 100, leukocyte esterase 2+, nitrite negative, WBCs 199, RBCs 211, trace bacteria, no mention of casts. EKG: Atrial fibrillation at 86 beats per minute.   ASSESSMENT AND PLAN: An 81 year old gentleman with history of coronary artery disease status post coronary artery bypass graft, aortic valve replacement, paroxysmal atrial fibrillation, type 2 diabetes requiring insulin, as well as prostate and bladder cancer currently on Bacille Calmette-Guerin (BCG) treatments, presenting with generalized symptoms of chills, nausea, vomiting and arthralgias which happened acutely after BCG treatment.  1. Reaction to BCG with generalized symptoms consistent with BCG reaction. Monitor for fever. If he does become acutely febrile, will add Levaquin to cover for cystitis. If symptoms worsen, including worsening arthralgias or shortness of  breath, could add glucocorticoids. For now, he can be observed for the above findings and covered symptomatically.  2. Acute kidney injury on chronic kidney disease: Intravenous fluid hydration with normal saline at 100 mL an hour.  3. Type 2 diabetes on insulin therapy: Continue insulin sliding scale and Lantus 50 units subcutaneous q.a.m.  4. Atrial fibrillation: He is rate controlled.  5. Hypertension: Continue home dose of Norvasc, Coreg and Lasix.  6. Coronary artery disease, status post coronary artery bypass graft: Continue with Coreg.  7. Deep vein thrombosis prophylaxis with heparin subcutaneous.   The patient is FULL CODE.   TIME SPENT: 45 minutes.   ____________________________ Aaron Mose. Chele Cornell, MD dkh:gb D: 03/03/2013 23:58:16 ET T: 03/04/2013 00:25:18 ET JOB#: [id number]  cc: Aaron Mose. Nasiah Lehenbauer, MD, <Dictator> Jeannette Maddy Woodfin Ganja MD ELECTRONICALLY SIGNED 03/04/2013 2:33

## 2014-10-03 ENCOUNTER — Telehealth: Payer: Self-pay

## 2014-10-03 LAB — WOUND CULTURE

## 2014-10-03 NOTE — Telephone Encounter (Signed)
Judeen Hammans called from dr. Beverly Gust office and stated he will need something in witting from patient cardiologist stating the Procrit did not cause patient stroke or he will not give Procrit.

## 2014-10-03 NOTE — Telephone Encounter (Signed)
The cancer center called and is hoping to discuss the patient's upcoming surgery. Callback - [phone]

## 2014-10-03 NOTE — Telephone Encounter (Signed)
Lorriane Shire scheduling transfusion?

## 2014-10-03 NOTE — Telephone Encounter (Signed)
Spoke wiht Dr Ma Hillock,  Patient will be transfused one unit instead for hgb < 8.0, less risky for CVA

## 2014-10-03 NOTE — Telephone Encounter (Signed)
Dr Ma Hillock said he would handle the transfusion

## 2014-10-03 NOTE — Telephone Encounter (Signed)
Left message for cancer center nurse for Dr. Ma Hillock to return call to office.

## 2014-10-04 ENCOUNTER — Telehealth: Payer: Self-pay | Admitting: *Deleted

## 2014-10-04 DIAGNOSIS — N183 Chronic kidney disease, stage 3 unspecified: Secondary | ICD-10-CM

## 2014-10-04 DIAGNOSIS — I272 Other secondary pulmonary hypertension: Secondary | ICD-10-CM | POA: Diagnosis not present

## 2014-10-04 DIAGNOSIS — Z952 Presence of prosthetic heart valve: Secondary | ICD-10-CM

## 2014-10-04 DIAGNOSIS — N189 Chronic kidney disease, unspecified: Secondary | ICD-10-CM | POA: Diagnosis not present

## 2014-10-04 DIAGNOSIS — Z89421 Acquired absence of other right toe(s): Secondary | ICD-10-CM | POA: Diagnosis not present

## 2014-10-04 DIAGNOSIS — I4891 Unspecified atrial fibrillation: Secondary | ICD-10-CM | POA: Diagnosis not present

## 2014-10-04 DIAGNOSIS — Z794 Long term (current) use of insulin: Secondary | ICD-10-CM | POA: Diagnosis not present

## 2014-10-04 DIAGNOSIS — Z8673 Personal history of transient ischemic attack (TIA), and cerebral infarction without residual deficits: Secondary | ICD-10-CM | POA: Diagnosis not present

## 2014-10-04 DIAGNOSIS — D500 Iron deficiency anemia secondary to blood loss (chronic): Secondary | ICD-10-CM

## 2014-10-04 DIAGNOSIS — R0609 Other forms of dyspnea: Secondary | ICD-10-CM | POA: Diagnosis not present

## 2014-10-04 DIAGNOSIS — E1165 Type 2 diabetes mellitus with hyperglycemia: Principal | ICD-10-CM

## 2014-10-04 DIAGNOSIS — IMO0002 Reserved for concepts with insufficient information to code with codable children: Secondary | ICD-10-CM

## 2014-10-04 DIAGNOSIS — C61 Malignant neoplasm of prostate: Secondary | ICD-10-CM | POA: Diagnosis not present

## 2014-10-04 DIAGNOSIS — Z8551 Personal history of malignant neoplasm of bladder: Secondary | ICD-10-CM | POA: Diagnosis not present

## 2014-10-04 DIAGNOSIS — Z87891 Personal history of nicotine dependence: Secondary | ICD-10-CM | POA: Diagnosis not present

## 2014-10-04 DIAGNOSIS — Z79899 Other long term (current) drug therapy: Secondary | ICD-10-CM | POA: Diagnosis not present

## 2014-10-04 DIAGNOSIS — E1129 Type 2 diabetes mellitus with other diabetic kidney complication: Secondary | ICD-10-CM

## 2014-10-04 DIAGNOSIS — R531 Weakness: Secondary | ICD-10-CM | POA: Diagnosis not present

## 2014-10-04 DIAGNOSIS — I10 Essential (primary) hypertension: Secondary | ICD-10-CM | POA: Diagnosis not present

## 2014-10-04 DIAGNOSIS — Z951 Presence of aortocoronary bypass graft: Secondary | ICD-10-CM | POA: Diagnosis not present

## 2014-10-04 DIAGNOSIS — R5383 Other fatigue: Secondary | ICD-10-CM | POA: Diagnosis not present

## 2014-10-04 DIAGNOSIS — D631 Anemia in chronic kidney disease: Secondary | ICD-10-CM | POA: Diagnosis not present

## 2014-10-04 DIAGNOSIS — D509 Iron deficiency anemia, unspecified: Secondary | ICD-10-CM | POA: Diagnosis not present

## 2014-10-04 DIAGNOSIS — E785 Hyperlipidemia, unspecified: Secondary | ICD-10-CM

## 2014-10-04 DIAGNOSIS — E119 Type 2 diabetes mellitus without complications: Secondary | ICD-10-CM | POA: Diagnosis not present

## 2014-10-04 LAB — IRON AND TIBC
IRON BIND. CAP.(TOTAL): 321 (ref 250–450)
IRON SATURATION: 17.1
Iron: 55 ug/dL
UNBOUND IRON-BIND. CAP.: 265.7

## 2014-10-04 LAB — SURGICAL PATHOLOGY

## 2014-10-04 LAB — FERRITIN: Ferritin (ARMC): 28 ng/mL

## 2014-10-04 LAB — CANCER CENTER HEMOGLOBIN: HGB: 8 g/dL — AB (ref 13.0–18.0)

## 2014-10-04 MED ORDER — INSULIN ASPART 100 UNIT/ML ~~LOC~~ SOLN: SUBCUTANEOUS | Status: DC

## 2014-10-04 NOTE — Telephone Encounter (Signed)
Pt called states he received a letter from his insurance stating they would no longer cover Humalog but they will cover Novolog.  Pt is requesting a Rx for Novolog vial to be sent to [facility].  Review of chart it appears we never Rx'd Humalog to pt only Lantus.  Pt states he has been taking both.  Please advise

## 2014-10-04 NOTE — Telephone Encounter (Signed)
The novolog has been sent,  It is similar to the lispro humalog so he should use it the same way,  Please arrange fasting lab visit on or after MAY 2, AND OV FOLLOWING

## 2014-10-04 NOTE — Telephone Encounter (Signed)
Labs ordered.

## 2014-10-04 NOTE — Addendum Note (Signed)
Addended by: [name] on: 10/04/2014 05:20 PM   Modules accepted: Orders

## 2014-10-04 NOTE — Telephone Encounter (Signed)
Lab appoint scheduled for 5.4.16 at 9:15 am.  Pt already had appoint for A1C and follow up appoint on 5.10.16.  Please enter orders

## 2014-10-06 DIAGNOSIS — R5383 Other fatigue: Secondary | ICD-10-CM | POA: Diagnosis not present

## 2014-10-06 DIAGNOSIS — Z79899 Other long term (current) drug therapy: Secondary | ICD-10-CM | POA: Diagnosis not present

## 2014-10-06 DIAGNOSIS — D631 Anemia in chronic kidney disease: Secondary | ICD-10-CM | POA: Diagnosis not present

## 2014-10-06 DIAGNOSIS — N189 Chronic kidney disease, unspecified: Secondary | ICD-10-CM | POA: Diagnosis not present

## 2014-10-06 DIAGNOSIS — C61 Malignant neoplasm of prostate: Secondary | ICD-10-CM | POA: Diagnosis not present

## 2014-10-06 DIAGNOSIS — D509 Iron deficiency anemia, unspecified: Secondary | ICD-10-CM | POA: Diagnosis not present

## 2014-10-09 NOTE — Consult Note (Signed)
General Aspect Primary Cardiologist: Dr. 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  ________________  PMH: 1. CAD s/p 1 vessel CABG 07/2007 (LIMA-->LAD) 2. Chronic a-fib not on anticoagulation for the past 2 months 2/2 anemia 3. Mobitz type 1 second degree AV block 4. Severe aortic valve stenosis s/p bioprosthetic valve replacement 07/2007 5. DM1 6. HTN 7. History of bladder CA on BCG 8. History of anemia 2/2 GIB ________________   Present Illness 81 year old male with the above problem list who presented to [facility] overnight with sudden onset of left upper extremity numbness and weakness. Patient is with known CAD s/p 1 vessel CABG in 07/2007 (LIMA--> LAD). Has been doing well from a CAD standpoint. Last stress echo 2011 that showed mild LVH, and slight increase in aortic valve gradient. Last echo 05/2013 showed EF 55-60%, moderate LVH, RWMA cannot be excluded, Aortic valve: A bioprosthesis was present. Transvalvular??velocity was midly elevated. Gradient consistent for??prosthetic valve.   He sees Dr. Sherrlyn Hock for his iron deficiency anemia in the setting of CKD stage III. Has previously been on Procrit with hgb down in the mid 8's, started in 2013. He did have heme positive stools, though it was unclear if these were false positive or not given his iron treatment. Hgb improved to 10.5 with his Procrit treatment. Eliquis was held at the beginning of September 2/2 continued decline of hgb in the setting of repeat positive stool cards. With discontinuation of Eliquis 2.5 mg bid hgb trended up to mid 10 range and  has remained there. At his last follow up with Dr. Mariah Milling he denied any melena. He did note in increase in fatigue.   Last evening around 10-11 PM he noted sudden onset of left upper extremity numbness and weakness. No grip strength in the left hand. He called his son, Dr. Elease Hashimoto, who advised him to call 911 and be taken to the hosptial. He denied any chest pain, palpitations, diaphoresis, nausea, vomiting, presyncope, or syncope. He spent his day on 1/17 in his usual state of health, watching sports. Upon his arrival to [facility] his head CT was negative for acute process. There was a noted new left inferior frontal lobe cystic encephalomalacia consistent with interval head trauma. Also noted was, moderate white matter changes suggest chronic small vessel ischemic disease and remote bilateral thalamus and left cerebellar infarcts. MRI to be ordered. EKG shows rate controlled a-fib with PVCs. CXR shows no acitve cardiopulmonary disease. HGb 10.4 upon admission. He is currently resting in bed, sitting up and continues to have numbness and weakness along the left upper extremity.   Physical Exam:  GEN no acute distress   HEENT hearing intact to voice   NECK supple   RESP normal resp effort  clear BS   CARD Irregular rate and rhythm  No murmur   ABD denies tenderness  soft   EXTR negative edema, 3/5 grip strength left upper extremity   SKIN decreased sensation left upper extremity   NEURO cranial nerves intact   PSYCH alert, A+O to time, place, person, good insight   Review of Systems:  General:  Fatigue  Weakness   Skin: No Complaints   ENT: No Complaints   Eyes: No Complaints   Neck: No Complaints   Respiratory: No Complaints   Cardiovascular: No Complaints   Gastrointestinal: No Complaints   Genitourinary: No Complaints   Vascular: No Complaints   Musculoskeletal: No Complaints   Neurologic: as above   Hematologic: No Complaints   Endocrine: No Complaints    Psychiatric: No Complaints   Review of Systems: All other systems were reviewed and found to be negative   Medications/Allergies Reviewed Medications/Allergies reviewed   Family & Social History:  Family and Social History:  Family History father: stroke; brother: DM, brother: prostate CA   Social History negative ETOH, negative Illicit drugs   + Tobacco Prior (greater than 1 year)   Place of Living Home     Atrial Fibrillation:    IDDM:    aortic stenosis:    renal insufficiency:    chronic anemia:    Degenerative Disc Disease:    bladder/porstate CA:    HTN:    GERD: on PPI 1x daily   Bladder cancer:    Gout:    Aortic Valve Insufficiency: replaced   Hypertension:    Diabetes:   Home Medications: Medication Instructions Status  acetaminophen 325 mg oral tablet 2 tab(s) orally every 4 hours, As needed, pain or temp. greater than 100.4 Active  Protonix 40 mg oral delayed release tablet 1 tab(s) orally once a day Active  Lasix 40 mg oral tablet 0.5 tab(s) orally once a day Active  Vitamin C 500 mg oral tablet 1 tab(s) orally once a day Active  Vitamin D3 1000 intl units oral tablet 1 tab(s) orally once a day Active  ferrous sulfate 325 mg (65 mg elemental iron) oral tablet 1 tab(s) orally once a day Active  PreserVision oral tablet 1 tab(s) orally 2 times a day Active  Humalog 100 units/mL subcutaneous solution  subcutaneous 4 times a day (before meals and at bedtime), As Needed per sliding scale Active  Lantus 100 units/mL subcutaneous solution 50 unit(s) subcutaneous once a day (in the morning) before breakfast Active  multivitamin 1 tab(s) orally once a day Active  docusate sodium sodium 100 mg oral capsule 1 cap(s) orally once a day (in the morning) Active  carvedilol 6.25 mg oral tablet 1 tab(s) orally 2 times a day Active   Lab Results:  Routine Chem:  18-Jan-16 00:46   Glucose, Serum  131  BUN  30  Creatinine (comp)  1.88  Sodium, Serum 136   Potassium, Serum 4.7  Chloride, Serum 104  CO2, Serum 25  Calcium (Total), Serum  8.4  Anion Gap 7  Osmolality (calc) 280  eGFR (African American)  44  eGFR (Non-African American)  37 (eGFR values <74mL/min/1.73 m2 may be an indication of chronic kidney disease (CKD). Calculated eGFR, using the MRDR Study equation, is useful in  patients with stable renal function. The eGFR calculation will not be reliable in acutely ill patients when serum creatinine is changing rapidly. It is not useful in patients on dialysis. The eGFR calculation may not be applicable to patients at the low and high extremes of body sizes, pregnant women, and vegetarians.)  Cardiac:  18-Jan-16 00:46   Troponin I 0.02 (0.00-0.05 0.05 ng/mL or less: NEGATIVE  Repeat testing in 3-6 hrs  if clinically indicated. >0.05 ng/mL: POTENTIAL  MYOCARDIAL INJURY. Repeat  testing in 3-6 hrs if  clinically indicated. NOTE: An increase or decrease  of  30% or more on serial  testing suggests a  clinically important change)  Routine Coag:  18-Jan-16 00:46   Prothrombin 13.2  INR 1.0 (INR reference interval applies to patients on anticoagulant therapy. A single INR therapeutic range for coumarins is not optimal for all indications; however, the suggested range for most indications is 2.0 - 3.0. Exceptions to the INR Reference Range may include: Prosthetic heart valves, acute myocardial infarction, prevention of myocardial infarction, and combinations of aspirin and anticoagulant. The need for a higher or lower target INR must be assessed individually. Reference: The Pharmacology and Management of the Vitamin K  antagonists: the seventh ACCP Conference on Antithrombotic and Thrombolytic Therapy. LZJQB.3419 Sept:126 (3suppl): [id number]. A HCT value >55% may artifactually increase the PT.  In one study,  the increase was an average of 25%. Reference:  "Effect on Routine and Special Coagulation Testing Values of Citrate  Anticoagulant Adjustment in Patients with High HCT Values." American Journal of Clinical Pathology 2006;126:400-405.)  Routine Hem:  18-Jan-16 00:46   WBC (CBC) 8.3  RBC (CBC)  3.90  Hemoglobin (CBC)  10.4  Hematocrit (CBC)  32.9  Platelet Count (CBC) 239  MCV 84  MCH 26.8  MCHC  31.7  RDW  15.4  Neutrophil % 66.9  Lymphocyte % 14.7  Monocyte % 9.9  Eosinophil % 7.4  Basophil % 1.1  Neutrophil # 5.5  Lymphocyte # 1.2  Monocyte # 0.8  Eosinophil # 0.6  Basophil # 0.1 (Result(s) reported on 27 Jun 2014 at 01:41AM.)   EKG:  EKG Interp. by me   Interpretation a-fib with PVCs, 86, left axis deviation, RBBB, no st/t changes   Radiology Results: XRay:    18-Jan-16 00:56, Chest PA and Lateral  Chest PA and Lateral   REASON FOR EXAM:    shortness of breath  COMMENTS:       PROCEDURE: DXR - DXR CHEST PA (OR AP) AND LATERAL  - Jun 27 2014 12:56AM     CLINICAL DATA:  Shortness of breath    EXAM:  CHEST  2 VIEW    COMPARISON:  03/03/2013    FINDINGS:  Moderate cardiomegaly. There is unchanged aortic tortuosity in this  patient status post aortic valve replacement. No edema or pneumonia.  No effusion or pneumothorax.     IMPRESSION:  No active cardiopulmonary disease.      Electronically Signed    By: Jorje Guild M.D.    On: 06/27/2014 01:03         Verified By: Gilford Silvius, M.D.,  CT:    18-Jan-16 01:02, CT Head Without Contrast  CT Head Without Contrast   REASON FOR EXAM:    left arm numbness  COMMENTS:       PROCEDURE: CT  - CT HEAD WITHOUT CONTRAST  - Jun 27 2014  1:02AM     CLINICAL DATA:  LEFT arm numbness to hand four 2 hrs.    EXAM:  CT HEAD WITHOUT CONTRAST    TECHNIQUE:  Contiguous axial images were obtained from the base of the skull  through the vertex without intravenous contrast.    COMPARISON:  CT of the head August 09, 2012  FINDINGS:  No intraparenchymal hemorrhage, mass effect nor midline shift.  Patchy supratentorial white  matter hypodensities are within normal  range for patient's age and though non-specific suggest sequelae of  chronic small vessel ischemic disease. Small remote bilateral  thalamus lacunar infarcts were present previously. Small LEFT  cerebellar infarct. No  acute large vascular territory infarcts. LEFT  inferior frontal lobe cystic encephalomalacia with mild ex vacuo  dilatation of the frontal horn of LEFT lateral ventricle. No  hydrocephalus.    No abnormal extra-axial fluid collections. Basal cisterns are  patent. Moderate calcific atherosclerosis of the carotid siphons.    No skull fracture. Occipital calvarial vascular lakes. Mild  temporomandibular osteoarthrosis. The included ocular globes and  orbital contents are non-suspicious. The mastoid aircells and  included paranasal sinuses are well-aerated.     IMPRESSION:  No acute intracranial process. If clinical concern for acute  ischemia, MRI of the brain with diffusion-weighted sequences would  be more sensitive.    New LEFT inferior frontal lobe cystic encephalomalacia consistent  with interval head trauma.    Moderate white matter changes suggest chronic small vessel ischemic  disease. Remote bilateral thalamus and LEFT cerebellar infarcts.  Electronically Signed    By: Elon Alas    On: 06/27/2014 01:19         Verified By: Ricky Ala, M.D.,    Penicillin: Swelling  Metoprolol: Bradycardia  Vital Signs/Nurse's Notes: **Vital Signs.:   18-Jan-16 07:48  Vital Signs Type Routine  Temperature Temperature (F) 97.9  Celsius 36.6  Pulse Pulse 77  Respirations Respirations 18  Systolic BP Systolic BP 284  Diastolic BP (mmHg) Diastolic BP (mmHg) 77  Mean BP 112  Pulse Ox % Pulse Ox % 95  Pulse Ox Activity Level  At rest  Oxygen Delivery Room Air/ 21 %    Impression 81 year old male with history of CAD s/p 1 vessel CABG 07/2007, chronic a-fib not on anticoagulation for the past 2 months 2/2 anemia,  Mobitz type 1 second degree AV block, severe aortic valve stenosis s/p bioprosthetic valve replacement, DM1, HTN, 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   1. Left arm/hand numbness and weakness: -Symptoms are concerning for acute stroke -Check MRI of brain to evaluate for stroke, if embolic stroke is present will need to hold anticoagulation for at least 48 hours post onset of symptoms, possibly longer depending on size of lesion (neurology to provide recommendations) -Carotid dopplers -Check echo (PASP nl) -PT consult  -Goal LDL <70 (check lipid)  2. Chronic a-fib: -Has been off anticoagulation since early September 2/2 anemia in the setting of +stool cards. HGB 9-10 at the time of discontinuation of Eliquis 2.5 mg bid. -He is agreeable to restarting Eliquis 2.5 mg bid (will have to hold restarting anticoagulation pending his MRI as above) -Currently rate controlled -Continue Coreg 6.25 mg bid  3. CAD s/p 1 vessel CABG 07/2007: -No angina -Aspirin allergy (previously on anticoagulation, dual therapy not indicated) -Continue Coreg as above  4. History of aortic stenosis s/p bioprostetic valve 07/2007: -Last echo 05/2013 showed aortic valve present -Check echo  5. Iron deficiency anemia: -HGB stable upon admission -Follow with restarting the of Eliquis (pending MRI of brain)   Electronic Signatures for Addendum Section:  Kathlyn Sacramento (MD) (Signed Addendum 18-Jan-16 23:00)  The patient was seen and examined. Agree with the above. known history of chronic A-fib. Eliquis has been on hold for 2 months due to anemia. He presented with left arm numbness and weakness. By exam there is weakness of left hand grip. MRI confirmed acute right frontal lobe stroke. Recommend resuming Eliquis 2.5 mg bid once cleared by neurology.   Electronic Signatures: Fletcher Anon,  Rogue Jury  (MD)  (Signed 18-Jan-16 23:00)  Co-Signer: General Aspect/Present Illness, Home Medications, Allergies Christell Faith M (PA-C)  (Signed 18-Jan-16 10:26)  Authored: General Aspect/Present Illness, History and Physical Exam, Review of System, Family & Social History, Past Medical History, Home Medications, Labs, EKG , Radiology, Allergies, Vital Signs/Nurse's Notes, Impression/Plan   Last Updated: 18-Jan-16 23:00 by Kathlyn Sacramento (MD)

## 2014-10-09 NOTE — Op Note (Signed)
PATIENT NAME:  [name], [name] MR#:  [id number] DATE OF BIRTH:  [birth year]  DATE OF PROCEDURE:  09/29/2014  SURGEON:  Durward Fortes, DPM.     PREOPERATIVE DIAGNOSIS:  Osteomyelitis, right 5th toe.   POSTOPERATIVE DIAGNOSIS:  Osteomyelitis, right 5th toe.   PROCEDURE:  Amputation, right 5th toe at the metatarsophalangeal joint.   ANESTHESIA:  Local MAC.   HEMOSTASIS:  None.   ESTIMATED BLOOD LOSS:  Less than 5 mL.   PATHOLOGY:  Right 5th toe.   CULTURES:  Bone cultures, proximal phalanx right fifth toe.   DRAINS:  None.   COMPLICATIONS:  None apparent.   OPERATIVE INDICATIONS:  This is an 81 year old male with a history of diabetes and neuropathy who recently developed a full-thickness ulceration which extended down to the bone. Serial radiographs revealed bone destruction consistent with osteomyelitis, and decision was made for amputation of the right 5th toe.   PROCEDURE IN DETAIL:  The patient was taken to the operating room and placed on the table in the supine position.  Following satisfactory sedation, the right 5th metatarsal area was anesthetized with 8 mL of 0.5% bupivacaine plain.  The foot was then prepped and draped in the usual sterile fashion.  Attention was directed to the distal aspect of the right foot where an elliptical incision was made coursing dorsal to plantar around the medial and lateral sides of the 5th toe.  Incision was carried sharply down to the level of the bone and dissection carried back to the level of the metatarsophalangeal joint where the toe was disarticulated and removed.  There was noted to be good healthy bleeding tissues with no remaining obvious infection.  The wound was flushed with copious amounts of sterile saline and closed using 4-0 nylon vertical mattress and simple interrupted sutures.  Xeroform and a sterile bandage were applied followed by Kerlix and an Ace wrap.  The patient tolerated the procedure and anesthesia well and was transported  to the PACU with vital signs stable and in good condition.    ____________________________ Sharlotte Alamo, DPM tc:kc D: 09/29/2014 14:53:07 ET T: 09/29/2014 15:52:39 ET JOB#: [id number]  cc: Sharlotte Alamo, DPM, <Dictator> Mark Hassey DPM ELECTRONICALLY SIGNED 10/07/2014 12:09

## 2014-10-09 NOTE — Consult Note (Signed)
Referring Physician:  Harrie Foreman   Primary Care Physician:  [facility], [address], [place], [place] [address], [place] [phone]  Reason for Consult: Admit Date: 27-Jun-2014  Chief Complaint: L arm weakness  Reason for Consult: CVA   History of Present Illness: History of Present Illness:   [name] presents to [facility] secondary to sudden onset of L arm weakness.  Pt was laying in bed with his L arm above his head when he noticed that his arm was numb and that it was tingling.  He noticed that he had some weakness in the L arm as well.  He denies any problems with his face or leg on the L.  He has never had this before.  He denies headache or vision changes as well.  ROS:  General denies complaints   HEENT no complaints   Lungs cough  SOB   Cardiac no complaints   GI no complaints   GU no complaints   Musculoskeletal no complaints   Extremities no complaints   Skin no complaints   Neuro numbness/tingling   Endocrine no complaints   Psych no complaints   Past Medical/Surgical Hx:  Atrial Fibrillation:   IDDM:   aortic stenosis:   renal insufficiency:   chronic anemia:   Degenerative Disc Disease:   bladder/porstate CA:   HTN:   GERD: on PPI 1x daily  Bladder cancer:   Gout:   Aortic Valve Insufficiency: replaced  Hypertension:   Diabetes:   Past Medical/ Surgical Hx:  Past Medical History reviewed by me as above   Past Surgical History reviewed by me as above   Home Medications: Medication Instructions Last Modified Date/Time  acetaminophen 325 mg oral tablet 2 tab(s) orally every 4 hours, As needed, pain or temp. greater than 100.4 03-Nov-15 15:05  Protonix 40 mg oral delayed release tablet 1 tab(s) orally once a day 03-Nov-15 15:05  Lasix 40 mg oral tablet 0.5 tab(s) orally once a day 03-Nov-15 15:05  Vitamin C 500 mg oral tablet 1 tab(s) orally once a day 03-Nov-15 15:05  Vitamin D3 1000 intl  units oral tablet 1 tab(s) orally once a day 03-Nov-15 15:05  ferrous sulfate 325 mg (65 mg elemental iron) oral tablet 1 tab(s) orally once a day 03-Nov-15 15:05  PreserVision oral tablet 1 tab(s) orally 2 times a day 03-Nov-15 15:05  Humalog 100 units/mL subcutaneous solution  subcutaneous 4 times a day (before meals and at bedtime), As Needed per sliding scale 03-Nov-15 15:05  Lantus 100 units/mL subcutaneous solution 50 unit(s) subcutaneous once a day (in the morning) before breakfast 03-Nov-15 15:05  multivitamin 1 tab(s) orally once a day 03-Nov-15 15:05  docusate sodium sodium 100 mg oral capsule 1 cap(s) orally once a day (in the morning) 03-Nov-15 15:05  carvedilol 6.25 mg oral tablet 1 tab(s) orally 2 times a day 03-Nov-15 15:05   Allergies:  Penicillin: Swelling  Metoprolol: Bradycardia  Allergies:  Allergies PCN, metoprolol   Social/Family History: Employment Status: retired  Lives With: alone  Living Arrangements: house  Social History: no tob, no EtOH, no illicits  Family History: no seizures, no stroke   Vital Signs: **Vital Signs.:   18-Jan-16 11:43  Vital Signs Type Routine  Temperature Temperature (F) 97.4  Celsius 36.3  Temperature Source oral  Pulse Pulse 78  Respirations Respirations 18  Systolic BP Systolic BP 604  Diastolic BP (mmHg) Diastolic BP (mmHg) 87  Mean BP 125  Pulse Ox % Pulse Ox % 99  Pulse Ox Activity Level  At rest  Oxygen Delivery Room Air/ 21 %   Physical Exam: General: nl weight, NAD  HEENT: normocephalic, sclera nonicteric, oropharynx clear  Neck: supple, no JVD, no bruits  Chest: severe wheezing and moderate tachypnea  Cardiac: RRR, no murmurs, no edema, 2+ pulses  Extremities: no C/C/E, FROM ;  bruising on L arm   Neurologic Exam: Mental Status: alert and oriented x 3, normal speech and language, follows complex commands  Cranial Nerves: PERRLA, EOMI, nl VF, face symmetric, tongue midline, shoulder shrug equal  Motor Exam:  5/5 B except 4/5 L triceps, 3/5 L wrist extension and 2/5 grip, no atrophy, no fasciculations  Deep Tendon Reflexes: 1+/4 B, plantars downgoing B  Sensory Exam: decreased pin on L forearm more prominent along C7 distro, deceased vibration B LE  Coordination: FTN and HTS WNL   Lab Results: Routine Chem:  18-Jan-16 00:46   Cholesterol, Serum 135  Triglycerides, Serum 109  HDL (INHOUSE) 54  VLDL Cholesterol Calculated 22  LDL Cholesterol Calculated 59 (Result(s) reported on 27 Jun 2014 at 11:07AM.)  Glucose, Serum  131  BUN  30  Creatinine (comp)  1.88  Sodium, Serum 136  Potassium, Serum 4.7  Chloride, Serum 104  CO2, Serum 25  Calcium (Total), Serum  8.4  Anion Gap 7  Osmolality (calc) 280  eGFR (African American)  44  eGFR (Non-African American)  37 (eGFR values <54mL/min/1.73 m2 may be an indication of chronic kidney disease (CKD). Calculated eGFR, using the MRDR Study equation, is useful in  patients with stable renal function. The eGFR calculation will not be reliable in acutely ill patients when serum creatinine is changing rapidly. It is not useful in patients on dialysis. The eGFR calculation may not be applicable to patients at the low and high extremes of body sizes, pregnant women, and vegetarians.)  Cardiac:  18-Jan-16 00:46   Troponin I 0.02 (0.00-0.05 0.05 ng/mL or less: NEGATIVE  Repeat testing in 3-6 hrs  if clinically indicated. >0.05 ng/mL: POTENTIAL  MYOCARDIAL INJURY. Repeat  testing in 3-6 hrs if  clinically indicated. NOTE: An increase or decrease  of 30% or more on serial  testing suggests a  clinically important change)  Routine Coag:  18-Jan-16 00:46   Prothrombin 13.2  INR 1.0 (INR reference interval applies to patients on anticoagulant therapy. A single INR therapeutic range for coumarins is not optimal for all indications; however, the suggested range for most indications is 2.0 - 3.0. Exceptions to the INR Reference Range may include:  Prosthetic heart valves, acute myocardial infarction, prevention of myocardial infarction, and combinations of aspirin and anticoagulant. The need for a higher or lower target INR must be assessed individually. Reference: The Pharmacology and Management of the Vitamin K  antagonists: the seventh ACCP Conference on Antithrombotic and Thrombolytic Therapy. WUJWJ.1914 Sept:126 (3suppl): N9146842. A HCT value >55% may artifactually increase the PT.  In one study,  the increase was an average of 25%. Reference:  "Effect on Routine and Special Coagulation Testing Values of Citrate Anticoagulant Adjustment in Patients with High HCT Values." American Journal of Clinical Pathology 2006;126:400-405.)  Routine Hem:  18-Jan-16 00:46   WBC (CBC) 8.3  RBC (CBC)  3.90  Hemoglobin (CBC)  10.4  Hematocrit (CBC)  32.9  Platelet Count (CBC) 239  MCV 84  MCH 26.8  MCHC  31.7  RDW  15.4  Neutrophil % 66.9  Lymphocyte % 14.7  Monocyte %  9.9  Eosinophil % 7.4  Basophil % 1.1  Neutrophil # 5.5  Lymphocyte # 1.2  Monocyte # 0.8  Eosinophil # 0.6  Basophil # 0.1 (Result(s) reported on 27 Jun 2014 at 01:41AM.)   Radiology Results: CT:    18-Jan-16 01:02, CT Head Without Contrast  CT Head Without Contrast   REASON FOR EXAM:    left arm numbness  COMMENTS:       PROCEDURE: CT  - CT HEAD WITHOUT CONTRAST  - Jun 27 2014  1:02AM     CLINICAL DATA:  LEFT arm numbness to hand four 2 hrs.    EXAM:  CT HEAD WITHOUT CONTRAST    TECHNIQUE:  Contiguous axial images were obtained from the base of the skull  through the vertex without intravenous contrast.    COMPARISON:  CT of the head August 09, 2012  FINDINGS:  No intraparenchymal hemorrhage, mass effect nor midline shift.  Patchy supratentorial white matter hypodensities are within normal  range for patient's age and though non-specific suggest sequelae of  chronic small vessel ischemic disease. Small remote bilateral  thalamus lacunar infarcts  were present previously. Small LEFT  cerebellar infarct. No acute large vascular territory infarcts. LEFT  inferior frontal lobe cystic encephalomalacia with mild ex vacuo  dilatation of the frontal horn of LEFT lateral ventricle. No  hydrocephalus.    No abnormal extra-axial fluid collections. Basal cisterns are  patent. Moderate calcific atherosclerosis of the carotid siphons.    No skull fracture. Occipital calvarial vascular lakes. Mild  temporomandibular osteoarthrosis. The included ocular globes and  orbital contents are non-suspicious. The mastoid aircells and  included paranasal sinuses are well-aerated.     IMPRESSION:  No acute intracranial process. If clinical concern for acute  ischemia, MRI of the brain with diffusion-weighted sequences would  be more sensitive.    New LEFT inferior frontal lobe cystic encephalomalacia consistent  with interval head trauma.    Moderate white matter changes suggest chronic small vessel ischemic  disease. Remote bilateral thalamus and LEFT cerebellar infarcts.  Electronically Signed    By: Elon Alas    On: 06/27/2014 01:19         Verified By: Ricky Ala, M.D.,   Radiology Impression: Radiology Impression: CT of head personally reviewed by me and shows mild white matter changes, moderate atrophy   Impression/Recommendations: Recommendations:   prior notes reviewed by me reviewed by me   Probable R hemispheric stroke-  given the gross involvement of hand grip as well as some triceps weakness but pt could have a compression neuropathy as well.  Pt has multiple risk factors for stroke.  No neck pain or shoulder pain to suggest plexus injury or radiculopathy. agree with MRI of brain will likely need to re-start anticoagulation check LDL and adjust for < 70 needs BP < 130/80 as outpatient continue ASA for now will sign out to covering neurologist  Electronic Signatures: Jamison Neighbor (MD)  (Signed 18-Jan-16  12:33)  Authored: REFERRING PHYSICIAN, Primary Care Physician, Consult, History of Present Illness, Review of Systems, PAST MEDICAL/SURGICAL HISTORY, HOME MEDICATIONS, ALLERGIES, Social/Family History, NURSING VITAL SIGNS, Physical Exam-, LAB RESULTS, RADIOLOGY RESULTS, Recommendations   Last Updated: 18-Jan-16 12:33 by Jamison Neighbor (MD)

## 2014-10-09 NOTE — H&P (Signed)
PATIENT NAME:  [name], [name] MR#:  [id number] DATE OF BIRTH:  12/09/1927  DATE OF ADMISSION:  06/27/2014  REFERRING PHYSICIAN:  Dr. Lurline Hare.    PRIMARY CARE DOCTOR: Dr. Gilford Rile.   ADMISSION DIAGNOSIS: Cerebrovascular accident.    HISTORY OF PRESENT ILLNESS:  This is an 81 year old Caucasian male who presents to the Emergency Department complaining of left forearm numbness. The patient states that he began to have numbness in his left forearm approximately 3 hours prior to his arrival in the Emergency Department. He states that throughout the day his back had been hurting him from his chronic back pain and he had taken 2 non-aspirin pain relievers shortly before he noticed the onset of symptoms. When the numbness began he denies any chest pain or shortness of breath, lightheadedness, blurred vision, nausea, vomiting, or diaphoresis. In the Emergency Department the specialist on call was contacted who stated patient was not a candidate for TPA as the therapeutic window had closed, which prompted the Emergency Department to call for admission.   REVIEW OF SYSTEMS:   CONSTITUTIONAL: The patient denies fever or weakness.  EYES: Denies blurred vision or inflammation.  EARS, NOSE AND THROAT: Denies tinnitus or sore throat.  RESPIRATORY: Denies cough or shortness of breath.  CARDIOVASCULAR: Denies chest pain, palpitations, orthopnea, or paroxysmal nocturnal dyspnea.  GASTROINTESTINAL: Denies nausea, vomiting, diarrhea, or abdominal pain.  GENITOURINARY: Denies dysuria, increased frequency, or hesitancy of urination.  ENDOCRINE: Polyuria or polydipsia.  HEMATOLOGIC AND LYMPHATIC: Denies easy bruising or bleeding.  INTEGUMENT: Denies rashes or lesions.  MUSCULOSKELETAL: Admits to arthralgias, particularly in his back, but denies myalgias.  NEUROLOGIC: Admits to numbness in his left forearm as stated above. The patient denies dysarthria or disequilibrium. He admits to having had episodes of vertigo in  the past, but denies vertigo with this episode. PSYCHIATRIC: Denies depression or suicidal ideation.   PAST MEDICAL HISTORY: Hypertension, diabetes type 2, atrial fibrillation, pulmonary hypertension, hyperlipidemia, anemia, prostate cancer, history of GI bleed, and a history of bladder cancer.   PAST SURGICAL HISTORY: Aortic valve repair, coronary artery bypass graft 1 vessel, left arm repair many, many years ago, and facial reconstruction as a teenager.   SOCIAL HISTORY: The patient is a former smoker. He occasionally has an alcoholic beverage. He denies any illicit drug use. He lives with his wife.   FAMILY HISTORY: The patient's younger brother has diabetes mellitus.   MEDICATIONS:  1. Acetaminophen 325 mg 2 tabs p.o. every 4 hours as needed.  2. Carvedilol 6.25 mg 1 tab p.o. b.i.d.  3. Docusate sodium 100 mg 1 capsule p.o. daily.  4. Ferrous sulfate 325 mg 1 tablet p.o. daily.  5. Lantus 50 units subcutaneously once daily.  6. Lasix 40 mg half a tablet p.o. daily.  7. Multivitamin 1 tablet p.o. daily.  8. PreserVision oral tablet 1 tab p.o. b.i.d.  9. Protonix 40 mg delayed release 1 tablet p.o. daily.  10. Vitamin C 500 mg 1 tab p.o. daily.  11. Vitamin D3, 1000 international units 1 tab p.o. daily.   ALLERGIES: METOPROLOL, PENICILLIN, AND ASPIRIN.   PERTINENT LABORATORY RESULTS AND RADIOGRAPHIC FINDINGS: Serum glucose is 131, BUN 30, creatinine is 1.88, serum sodium is 136, potassium is 4.7, chloride is 104, bicarbonate 25, calcium is 8.4. Troponin is negative. White blood cell count is 8.3, hemoglobin 10.4, hematocrit 32.9, platelet count 239,000, MCV is 84. INR is 1. Chest x-ray shows no active cardiopulmonary disease. CT of the head shows no acute intracranial process,  however there is a new inferior frontal lobe cystic encephalomalacia consistent with interval head trauma since the last CT of his head. There is moderate white matter change suggestive of chronic small vessel  ischemic disease as well, there is also some remote bilateral thalamic and left cerebellar infarct.   PHYSICAL EXAMINATION:  VITAL SIGNS: Temperature is 98.1, pulse 82, respirations 20, blood pressure 173/84, pulse oximetry is 93% on room air.  GENERAL: The patient is alert and oriented x 3, in no apparent distress.  HEENT: Normocephalic, atraumatic. Pupils equal, round, and reactive to light and accommodation. Extraocular movements are intact. Mucous membranes are moist.  NECK: Trachea is midline. No adenopathy. The thyroid is nonpalpable and nontender.  CHEST: Symmetric and atraumatic. There is a well-healed midline scar from sternotomy.  CARDIOVASCULAR: Irregularly irregular rate and rhythm with normal S1, normal S2. No rubs, clicks, or murmurs appreciated.  LUNGS: Clear to auscultation bilaterally. Normal effort and excursion.  ABDOMEN: Positive bowel sounds. Soft, nontender, nondistended. No hepatosplenomegaly.  GENITOURINARY: Deferred.  MUSCULOSKELETAL: The patient moves both lower extremities equally. He has 5 out of 5 strength in the lower extremities bilaterally.  His upper extremity exam is significant for markedly decreased grip strength from the left hand. He also has some un-coordination with the left hand, but notably it has good motion and motor function.  SKIN: Warm and dry. There are no rashes or lesions.  EXTREMITIES: No clubbing, cyanosis, or edema. 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  NEUROLOGIC: Cranial nerves II through XII are grossly intact. The patient has decreased discriminative touch on the left arm. He has no dysmetria with the left arm although he states that it feels very heavy which results at times in some past pointing when bringing the finger to his own nose.   PSYCHIATRIC: Mood is normal. Affect is congruent. The patient has excellent judgment and insight into his medical condition.    ASSESSMENT AND PLAN: This is an 81 year old male admitted for cerebrovascular accident.   1.  Cerebrovascular accident. The left forearm numbness and weakness have not yet resolved. It has been approximately 3-1/2 to 4 hours since onset of symptoms. The patient was not a candidate for thrombolytic therapy. He has been off anticoagulation for approximately 2 months now due to heme positive stools when the patient was on Eliquis. He also reports an allergy to aspirin. I am unsure what type of anticoagulation to give the patient at this time. I would consider Plavix, but have consulted neurology for their input. We will obtain an MRI at some point in the next 24-48 hours.  2.  Hypertension. Continue Coreg, but permissive hypertension will be allowed for systolic pressures less than 220.  3.  Diabetes mellitus type 2. We will continue the patient's basal insulin, although I have reduced it some due to the fact that he will be on a carbohydrate controlled diabetic diet while in the hospital. I have also started sliding scale insulin while the patient is hospitalized.  4.  Atrial fibrillation. The patient is rate controlled. Anticoagulation to be addressed by neurology.  5.  Anemia. This is mild and stable and stable. We will continue iron supplementation.  6.  Pulmonary hypertension. The patient carries this diagnosis, but he is not on any medication for this condition. I recommend followup as an outpatient as the patient does not have any cardiopulmonary issues at this time.  7.  Hyperlipidemia.  Continue statin.  8.  Deep vein thrombosis prophylaxis with subcutaneous heparin.  9.  Gastrointestinal prophylaxis. None.   CODE STATUS: The patient is a full code.   TIME SPENT ON ADMISSION ORDERS AND PATIENT CARE: Approximately 35 minutes.     ____________________________ Norva Riffle. Marcille Blanco, MD msd:bu D: 06/27/2014 14:30:35 ET T: 06/27/2014 15:02:28 ET JOB#: [id number]  cc: Norva Riffle. Marcille Blanco, MD,  <Dictator> Norva Riffle Rosine Solecki MD ELECTRONICALLY SIGNED 07/05/2014 2:47

## 2014-10-09 NOTE — Op Note (Signed)
PATIENT NAME:  [name], [name] MR#:  [id number] DATE OF BIRTH:  07-26-1927  DATE OF PROCEDURE:  09/15/2014  PREOPERATIVE DIAGNOSES: 1. Peripheral arterial disease with ulceration right lower extremity.  2. Diabetes.  3. Nonhealing ulcer right mid foot.   POSTOPERATIVE DIAGNOSES: 1. Peripheral arterial disease with ulceration right lower extremity.  2. Diabetes.  3. Nonhealing ulcer right mid foot.   PROCEDURES: 1. Ultrasound guidance for vascular access left femoral artery.  2. Catheter placement into right posterior tibial artery and right peroneal artery from left femoral approach.  3. Aortogram and selective right lower extremity angiogram.  4. Percutaneous transluminal angioplasty of right peroneal artery with a 4 mm diameter Lutonix drug-coated angioplasty balloon.  5. Percutaneous transluminal angioplasty of entire right posterior tibial artery from its origin into the mid foot with 2 mm diameter distally and 3 mm diameter proximally angioplasty balloon.  6. StarClose closure device, left femoral artery.   SURGEON: Algernon Huxley, MD   ANESTHESIA: Local with moderate conscious sedation.   ESTIMATED BLOOD LOSS: Minimal.   INDICATION FOR PROCEDURE: This is an 81 year old gentleman with a nonhealing ulcer of the right foot. His digit pressure was moderately reduced and we suspect tibial disease from his noninvasive studies. He is brought in for angiography for further evaluation and potential treatment. Risks and benefits were discussed and informed consent was obtained.   PROCEDURE IN DETAIL:  The patient is brought to the vascular suite. Groins were shaved and prepped, and a sterile surgical field was created. Ultrasound was used to visualize a patent left femoral artery.   It was then accessed under direct ultrasound guidance without difficulty with a Seldinger needle and a permanent image was recorded. A 5 French sheath was placed. Pigtail catheter was placed in the aorta at the L1  level and AP aortogram was performed. This showed normal visceral vessels in the renal and mesenteric arteries. The aorta and iliac arteries were calcified, but widely patent. I then used the pigtail catheter and a J-wire to cross the aortic bifurcation and advanced to the right femoral head. Selective right lower extremity angiogram was then performed. This demonstrated normal common femoral artery, profunda femoris artery, the superficial femoral artery had a few areas of 20 to 30% stenosis throughout its course and it was heavily calcific, but not stenotic.  The popliteal artery was patent. There was a normal tibial trifurcation; however, the anterior tibial artery occluded just beyond its origin. The posterior tibial artery occluded about 2 or 3 cm beyond its origin and the peroneal artery had about a 70% stenosis in the proximal portion. At this point, I gave the patient a small dose of intravenous heparin and placed a 6 [place] sheath over the [employer] wire. I got into the tibioperoneal trunk using the 135 CXI catheter, I navigated through the peroneal artery stenosis and confirmed intraluminal flow. I then replaced the 0.035 wire. This was quite a large vessel. A 4 mm diameter Lutonix drug-coated angioplasty balloon was inflated in the peroneal artery.  The inflation was held for 1 minute at 8 atmospheres. The balloon was then deflated. Completion angiogram showed significantly improved flow with about a 15 to 20% residual stenosis, which was not flow limiting. I then turned my attention to the posterior tibial artery. This long occlusion was not easily crossed but with the help of the 0.018 Advantage wire and the 135 CXI catheter, I was able to navigate through the occlusion and confirm intraluminal flow in the  foot of the posterior tibial artery.  I then replaced the 0.018 Advantage wire, a 2 mm diameter x 22 cm length angioplasty balloon was inflated from the mid foot up into the distal  posterior tibial artery above the ankle. I then took a 3 mm diameter angioplasty balloon from the distal posterior tibial artery above the ankle all the way up through its origin with 2 inflations to get up into the  tibioperoneal trunk. Completion angiogram following this showed markedly improved flow. The posterior tibial artery had about a 20% residual stenosis at its origin and about a 30% residual stenosis just above the ankle, but these were not flow limiting. The flow was brisk into the foot and the peroneal artery also continued to have good flow. At this point, I elected to terminate the procedure. The sheath was removed. StarClose closure device deployed in the usual fashion with excellent hemostatic result. The patient tolerated the procedure well and was taken to the recovery room in stable condition.     ____________________________ Algernon Huxley, MD jsd:tr D: 09/15/2014 14:11:11 ET T: 09/15/2014 16:38:25 ET JOB#: [id number]  cc: Algernon Huxley, MD, <Dictator> Sharlotte Alamo, DPM Algernon Huxley MD ELECTRONICALLY SIGNED 09/29/2014 12:49

## 2014-10-09 NOTE — Discharge Summary (Signed)
PATIENT NAME:  [name], [name] MR#:  [id number] DATE OF BIRTH:  26-Jun-1927  DATE OF ADMISSION:  06/28/2014 DATE OF DISCHARGE:  06/29/2014  FINAL DIAGNOSES:  1. Acute right frontal cerebral infarction.  2. Atrial fibrillation, likely cause of cerebrovascular accident.  3. Recent gastrointestinal bleeding with iron deficiency anemia, likely with an element of chronic blood loss anemia.  4. Chronic obstructive pulmonary disease with acute exacerbation.  5. Type 2 diabetes, uncontrolled.  6. Hypertension.  7. Pulmonary hypertension.  8. Hyperlipidemia.  9. History of prostate cancer.  10. History of bladder cancer.  11. Status post bioprosthetic aortic valve replacement.  12. Coronary artery disease with prior bypass surgery.   HISTORY AND PHYSICAL: Please see dictated admission history and physical.   [place]: The patient was admitted with new onset of left upper extremity weakness and numbness. MRI confirmed acute CVA. The patient is with history of atrial fibrillation, had been taken off full anticoagulation approximately 2 months ago with findings of heme-positive stool. He was placed on aspirin. Cardiology and neurology were consulted. Echocardiogram was performed, which revealed a normal LVEF at 60% to 65%, with impaired relaxation of the LV diastolic filling, mild LVH, moderately enlarged left atrium, with normal functioning bioprosthetic valve. Dopplers were performed, which revealed no flow-limiting disease. He was continued on aspirin and recommendation was to put him back on Eliquis at some point in the future. Neurology recommended that this be instituted likely 1 week after his acute event.   He did have some increasing wheezing, levalbuterol was added, and he had a good response to this. His blood sugars were challenged during his hospitalization, but as his Lantus was titrated upward, this seemed to be controlled. He was noted to have accelerated hypertension;  however, in the face of acute CVA, the target blood pressure was to keep his systolic less than 706, and this was accomplished with adjustment of medications.   It was felt that he would benefit from rehabilitation and that he would probably do best with inpatient rehabilitation. [facility] was kind enough to offer the patient a  bed, and the plan is for him to be transferred there in stable condition with his physical activity to be up with assistance as tolerated. He should follow a low sodium, carbohydrate-controlled diet. Blood sugar should be checked 3 times a day before meals, with sliding scale insulin as needed. Physical therapy and occupational therapy will evaluate and treat the patient. MET-B and CBC should be performed in 3 to 4 days with the results to the rehabilitation physician. If his hemoglobin remains stable and he has no evidence of significant bleeding after 4 to 5 days, recommendation would be to stop aspirin and start him on Eliquis 2.5 mg twice a day. The risks of this and benefits of this were discussed on multiple occasions with the patient.   DISCHARGE MEDICATIONS:  1. Lasix 20 mg p.o. daily.  2. Vitamin C 500 mg p.o. daily. 3. Vitamin D 1000 units p.o. daily.  4. Iron 325 mg p.o. daily.  5. Lantus 50 units subcutaneously daily.  6. Multivitamin 1 p.o. daily.  7. Colace 100 mg p.o. daily.  8. Carvedilol 6.25 mg p.o. b.i.d.  9. Humalog by sliding scale.  10. Atorvastatin 40 mg p.o. daily.  11. Amlodipine 5 mg p.o. daily.  12. Pantoprazole 40 mg p.o. daily.  13. Losartan 25 mg p.o. daily. 14.  levalbuterol 1.25 mg inhaled every 6 hours while awake.  ____________________________ Adin Hector, MD bjk:[id number] D: 06/29/2014 08:01:01 ET T: 06/29/2014 08:45:45 ET JOB#: [id number]  cc: Tama High III, MD, <Dictator> Ramonita Lab MD ELECTRONICALLY SIGNED 07/01/2014 9:23

## 2014-10-12 ENCOUNTER — Other Ambulatory Visit: Payer: Medicare Other

## 2014-10-12 ENCOUNTER — Other Ambulatory Visit: Payer: Self-pay | Admitting: *Deleted

## 2014-10-12 ENCOUNTER — Other Ambulatory Visit (INDEPENDENT_AMBULATORY_CARE_PROVIDER_SITE_OTHER): Payer: Medicare Other

## 2014-10-12 DIAGNOSIS — IMO0002 Reserved for concepts with insufficient information to code with codable children: Secondary | ICD-10-CM

## 2014-10-12 DIAGNOSIS — E1165 Type 2 diabetes mellitus with hyperglycemia: Secondary | ICD-10-CM

## 2014-10-12 DIAGNOSIS — E785 Hyperlipidemia, unspecified: Secondary | ICD-10-CM

## 2014-10-12 DIAGNOSIS — N183 Chronic kidney disease, stage 3 unspecified: Secondary | ICD-10-CM

## 2014-10-12 DIAGNOSIS — E1129 Type 2 diabetes mellitus with other diabetic kidney complication: Secondary | ICD-10-CM

## 2014-10-12 LAB — CBC WITH DIFFERENTIAL/PLATELET
Basophils Absolute: 0 10*3/uL (ref 0.0–0.1)
Basophils Relative: 0.7 % (ref 0.0–3.0)
Eosinophils Absolute: 0.4 10*3/uL (ref 0.0–0.7)
Eosinophils Relative: 6.5 % — ABNORMAL HIGH (ref 0.0–5.0)
LYMPHS ABS: 0.9 10*3/uL (ref 0.7–4.0)
Lymphocytes Relative: 16 % (ref 12.0–46.0)
MCHC: 33.1 g/dL (ref 30.0–36.0)
MCV: 82.8 fl (ref 78.0–100.0)
MONOS PCT: 10.3 % (ref 3.0–12.0)
Monocytes Absolute: 0.6 10*3/uL (ref 0.1–1.0)
NEUTROS ABS: 3.7 10*3/uL (ref 1.4–7.7)
Neutrophils Relative %: 66.5 % (ref 43.0–77.0)
Platelets: 238 10*3/uL (ref 150.0–400.0)
RBC: 3.24 Mil/uL — ABNORMAL LOW (ref 4.22–5.81)
RDW: 17.1 % — AB (ref 11.5–15.5)
WBC: 5.5 10*3/uL (ref 4.0–10.5)

## 2014-10-12 LAB — HEMOGLOBIN A1C: Hgb A1c MFr Bld: 6.2 % (ref 4.6–6.5)

## 2014-10-12 LAB — COMPREHENSIVE METABOLIC PANEL
ALBUMIN: 3.8 g/dL (ref 3.5–5.2)
ALT: 12 U/L (ref 0–53)
AST: 18 U/L (ref 0–37)
Alkaline Phosphatase: 99 U/L (ref 39–117)
BUN: 42 mg/dL — ABNORMAL HIGH (ref 6–23)
CHLORIDE: 102 meq/L (ref 96–112)
CO2: 26 mEq/L (ref 19–32)
Calcium: 9 mg/dL (ref 8.4–10.5)
Creatinine, Ser: 1.92 mg/dL — ABNORMAL HIGH (ref 0.40–1.50)
GFR: 35.53 mL/min — ABNORMAL LOW (ref >60.00)
Glucose, Bld: 277 mg/dL — ABNORMAL HIGH (ref 70–99)
POTASSIUM: 4.6 meq/L (ref 3.5–5.1)
Sodium: 134 mEq/L — ABNORMAL LOW (ref 135–145)
Total Bilirubin: 0.5 mg/dL (ref 0.2–1.2)
Total Protein: 7 g/dL (ref 6.0–8.3)

## 2014-10-12 LAB — LIPID PANEL
Cholesterol: 125 mg/dL (ref 0–200)
HDL: 44.8 mg/dL (ref >39.00)
LDL Cholesterol: 67 mg/dL (ref 0–99)
NonHDL: 80.2
TRIGLYCERIDES: 64 mg/dL (ref 0.0–149.0)
Total CHOL/HDL Ratio: 3
VLDL: 12.8 mg/dL (ref 0.0–40.0)

## 2014-10-12 LAB — MICROALBUMIN / CREATININE URINE RATIO
Creatinine,U: 66.2 mg/dL
MICROALB UR: 33.5 mg/dL — AB (ref 0.0–1.9)
Microalb Creat Ratio: 50.6 mg/g — ABNORMAL HIGH (ref 0.0–30.0)

## 2014-10-12 MED ORDER — PANTOPRAZOLE SODIUM 40 MG PO TBEC: 40.0000 mg | DELAYED_RELEASE_TABLET | Freq: Every day | ORAL | Status: DC

## 2014-10-13 ENCOUNTER — Other Ambulatory Visit: Payer: Self-pay

## 2014-10-13 DIAGNOSIS — D649 Anemia, unspecified: Secondary | ICD-10-CM

## 2014-10-14 ENCOUNTER — Telehealth: Payer: Self-pay | Admitting: *Deleted

## 2014-10-14 ENCOUNTER — Inpatient Hospital Stay: Payer: Medicare Other | Attending: Internal Medicine

## 2014-10-14 DIAGNOSIS — D509 Iron deficiency anemia, unspecified: Secondary | ICD-10-CM | POA: Insufficient documentation

## 2014-10-14 DIAGNOSIS — D649 Anemia, unspecified: Secondary | ICD-10-CM

## 2014-10-14 DIAGNOSIS — Z79899 Other long term (current) drug therapy: Secondary | ICD-10-CM | POA: Insufficient documentation

## 2014-10-14 LAB — SAMPLE TO BLOOD BANK

## 2014-10-14 LAB — IRON AND TIBC
Iron: 40 ug/dL — ABNORMAL LOW (ref 45–182)
SATURATION RATIOS: 12 % — AB (ref 17.9–39.5)
TIBC: 330 ug/dL (ref 250–450)
UIBC: 290 ug/dL

## 2014-10-14 LAB — HEMOGLOBIN: Hemoglobin: 8.8 g/dL — ABNORMAL LOW (ref 13.0–18.0)

## 2014-10-14 NOTE — Telephone Encounter (Signed)
Call to patient to alert him that hgb 8.8 today and he does not need blood transfusion.  He states he feels that he sure is tired.  I explained to him that if he gets worse with tiredness or shortness of breath over the weekend he needs to go to ER.

## 2014-10-14 NOTE — Telephone Encounter (Signed)
-----   Message from Leia Alf, MD sent at 10/14/2014  2:39 PM EDT ----- Hb is 8.8. No need for PRBC tx today. If he has acute symptoms, he needs to go to ER. Thanks.

## 2014-10-17 NOTE — Progress Notes (Signed)
Called the pt and explained that iron is better than before when we check it last month but it is still low and he needs 4 hour venofer like he has had before.  He states he has appt already mon and wed this week.  I offered Thursday and Friday and he chose Friday at 9 am and appt given.  I also called Luann and left her message that pt coming Friday for 4  Hour venofer.

## 2014-10-18 ENCOUNTER — Ambulatory Visit: Payer: Medicare Other | Admitting: Internal Medicine

## 2014-10-18 DIAGNOSIS — I70238 Atherosclerosis of native arteries of right leg with ulceration of other part of lower right leg: Secondary | ICD-10-CM | POA: Diagnosis not present

## 2014-10-18 DIAGNOSIS — I70221 Atherosclerosis of native arteries of extremities with rest pain, right leg: Secondary | ICD-10-CM | POA: Diagnosis not present

## 2014-10-18 DIAGNOSIS — I739 Peripheral vascular disease, unspecified: Secondary | ICD-10-CM | POA: Diagnosis not present

## 2014-10-18 DIAGNOSIS — I2510 Atherosclerotic heart disease of native coronary artery without angina pectoris: Secondary | ICD-10-CM | POA: Diagnosis not present

## 2014-10-18 DIAGNOSIS — I10 Essential (primary) hypertension: Secondary | ICD-10-CM | POA: Diagnosis not present

## 2014-10-18 DIAGNOSIS — E119 Type 2 diabetes mellitus without complications: Secondary | ICD-10-CM | POA: Diagnosis not present

## 2014-10-19 ENCOUNTER — Encounter: Payer: Self-pay | Admitting: Internal Medicine

## 2014-10-19 ENCOUNTER — Ambulatory Visit (INDEPENDENT_AMBULATORY_CARE_PROVIDER_SITE_OTHER): Payer: Medicare Other | Admitting: Internal Medicine

## 2014-10-19 ENCOUNTER — Other Ambulatory Visit: Payer: Self-pay | Admitting: Internal Medicine

## 2014-10-19 VITALS — BP 124/60 | HR 56 | Temp 97.5°F | Resp 14 | Ht 69.0 in | Wt 234.2 lb

## 2014-10-19 DIAGNOSIS — E1121 Type 2 diabetes mellitus with diabetic nephropathy: Secondary | ICD-10-CM

## 2014-10-19 DIAGNOSIS — E08621 Diabetes mellitus due to underlying condition with foot ulcer: Secondary | ICD-10-CM

## 2014-10-19 DIAGNOSIS — N183 Chronic kidney disease, stage 3 unspecified: Secondary | ICD-10-CM

## 2014-10-19 DIAGNOSIS — I63411 Cerebral infarction due to embolism of right middle cerebral artery: Secondary | ICD-10-CM

## 2014-10-19 DIAGNOSIS — N189 Chronic kidney disease, unspecified: Secondary | ICD-10-CM

## 2014-10-19 DIAGNOSIS — I10 Essential (primary) hypertension: Secondary | ICD-10-CM

## 2014-10-19 DIAGNOSIS — D631 Anemia in chronic kidney disease: Secondary | ICD-10-CM

## 2014-10-19 DIAGNOSIS — L97529 Non-pressure chronic ulcer of other part of left foot with unspecified severity: Secondary | ICD-10-CM

## 2014-10-19 NOTE — Progress Notes (Signed)
Pre-visit discussion using our clinic review tool. No additional management support is needed unless otherwise documented below in the visit note.  

## 2014-10-19 NOTE — Patient Instructions (Addendum)
Your diabetes is very well controlled currently. Your low blood sugars may be due to missing your meals on time  You should always take a protein bar (Atkins,  Nutrigrain, etc) with you when you leave the house to eat if your meal is going to be delayed   This will prevent low blood sugars.  In fact,  I want you to eat a protein bar between breakfast and lunch,  And between lunch and dinner     Dr Ma Hillock will not be able to give you blood unless you are short of breath or having chest pain or dizziness
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
## 2014-10-21 ENCOUNTER — Inpatient Hospital Stay: Payer: Medicare Other

## 2014-10-21 ENCOUNTER — Other Ambulatory Visit: Payer: Self-pay | Admitting: Physical Medicine and Rehabilitation

## 2014-10-21 VITALS — BP 120/70 | HR 55 | Temp 98.9°F | Resp 18

## 2014-10-21 DIAGNOSIS — D509 Iron deficiency anemia, unspecified: Secondary | ICD-10-CM | POA: Diagnosis not present

## 2014-10-21 DIAGNOSIS — Z79899 Other long term (current) drug therapy: Secondary | ICD-10-CM | POA: Diagnosis not present

## 2014-10-21 DIAGNOSIS — D500 Iron deficiency anemia secondary to blood loss (chronic): Secondary | ICD-10-CM

## 2014-10-21 MED ORDER — SODIUM CHLORIDE 0.9 % IV SOLN
500.0000 mg | Freq: Once | INTRAVENOUS | Status: AC
Start: 2014-10-21 — End: 2014-10-21
  Administered 2014-10-21: 500 mg via INTRAVENOUS
  Filled 2014-10-21: qty 25

## 2014-10-22 NOTE — Assessment & Plan Note (Signed)
Cr is stable.   He is on an ARB .

## 2014-10-22 NOTE — Assessment & Plan Note (Signed)
S/p 5h metatarsal right foot April 21 for osteomyelitis

## 2014-10-22 NOTE — Assessment & Plan Note (Signed)
Well controlled on current regimen. Renal function stable, no changes today.  Lab Results  Component Value Date   CREATININE 1.92* 10/12/2014   Lab Results  Component Value Date   NA 134* 10/12/2014   K 4.6 10/12/2014   CL 102 10/12/2014   CO2 26 10/12/2014

## 2014-10-22 NOTE — Assessment & Plan Note (Signed)
Now under excellent .   Foot exam was abnormal today and he is up to date on eye exams.   Given his age I do not favor  initiating statin therapy unless his LDL is > 100,  He is on an ARB .    Lab Results  Component Value Date   HGBA1C 6.2 10/12/2014   Lab Results  Component Value Date   MICROALBUR 33.5* 10/12/2014    Lab Results  Component Value Date   CHOL 125 10/12/2014   HDL 44.80 10/12/2014   LDLCALC 67 10/12/2014   TRIG 64.0 10/12/2014   CHOLHDL 3 10/12/2014

## 2014-10-22 NOTE — Assessment & Plan Note (Signed)
Secondary to CKD , possibly MDS.   Managed by  Dr. Ma Hillock at [place] [facility] who gave IV Iron Venofer 500 MG x 1 on March 11 for HGB 9.0 AND LOW IRON STORES.  Dr Pandit's will not give Procrit and other alternatives rsecondary to recent  embolic CVA January 9969,  hgb dropped to o 7.6 during hospitalziation and is now 8.8 .  Iv transfusion is scheduled for Friday. [name]

## 2014-10-27 ENCOUNTER — Other Ambulatory Visit: Payer: Self-pay | Admitting: Internal Medicine

## 2014-10-27 NOTE — Telephone Encounter (Signed)
refill authorized and sent

## 2014-10-27 NOTE — Telephone Encounter (Signed)
Ok to send refill, Rx'd by Dr. Erling Cruz previously

## 2014-11-08 ENCOUNTER — Encounter: Payer: Self-pay | Admitting: Cardiovascular Disease

## 2014-11-08 ENCOUNTER — Ambulatory Visit: Payer: Medicare Other | Admitting: Internal Medicine

## 2014-11-08 ENCOUNTER — Ambulatory Visit (INDEPENDENT_AMBULATORY_CARE_PROVIDER_SITE_OTHER): Payer: Medicare Other | Admitting: Cardiovascular Disease

## 2014-11-08 VITALS — BP 140/70 | HR 68 | Ht 70.0 in | Wt 238.5 lb

## 2014-11-08 DIAGNOSIS — I10 Essential (primary) hypertension: Secondary | ICD-10-CM | POA: Diagnosis not present

## 2014-11-08 DIAGNOSIS — Z952 Presence of prosthetic heart valve: Secondary | ICD-10-CM

## 2014-11-08 DIAGNOSIS — Z954 Presence of other heart-valve replacement: Secondary | ICD-10-CM | POA: Diagnosis not present

## 2014-11-08 DIAGNOSIS — I2510 Atherosclerotic heart disease of native coronary artery without angina pectoris: Secondary | ICD-10-CM | POA: Diagnosis not present

## 2014-11-08 DIAGNOSIS — I482 Chronic atrial fibrillation, unspecified: Secondary | ICD-10-CM

## 2014-11-08 DIAGNOSIS — I63411 Cerebral infarction due to embolism of right middle cerebral artery: Secondary | ICD-10-CM

## 2014-11-08 NOTE — Assessment & Plan Note (Signed)
Anticoagulation previously held in the setting of anemia, possible GI bleed. During this time he had stroke. Now tolerating low-dose eliquis. Samples provided
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
## 2014-11-08 NOTE — Assessment & Plan Note (Signed)
Currently with no symptoms of angina. No further workup at this time. Continue current medication regimen. 

## 2014-11-08 NOTE — Assessment & Plan Note (Signed)
Blood pressure is well controlled on today's visit. No changes made to the medications. 

## 2014-11-08 NOTE — Assessment & Plan Note (Signed)
Bioprosthetic valve, stable, no new clinical concerns for change in his valve disease Last echocardiogram late 2014

## 2014-11-08 NOTE — Patient Instructions (Signed)
You are doing well. No medication changes were made.  Please call us if you have new issues that need to be addressed before your next appt.  Your physician wants you to follow-up in: 6 months.  You will receive a reminder letter in the mail two months in advance. If you don't receive a letter, please call our office to schedule the follow-up appointment.   

## 2014-11-08 NOTE — Assessment & Plan Note (Signed)
Heart rate well controlled. Tolerating anticoagulation. No changes to his medications

## 2014-11-17 DIAGNOSIS — E1140 Type 2 diabetes mellitus with diabetic neuropathy, unspecified: Secondary | ICD-10-CM | POA: Diagnosis not present

## 2014-11-17 DIAGNOSIS — B351 Tinea unguium: Secondary | ICD-10-CM | POA: Diagnosis not present

## 2014-12-08 ENCOUNTER — Other Ambulatory Visit: Payer: Self-pay

## 2014-12-08 DIAGNOSIS — N189 Chronic kidney disease, unspecified: Principal | ICD-10-CM

## 2014-12-08 DIAGNOSIS — D631 Anemia in chronic kidney disease: Secondary | ICD-10-CM

## 2014-12-09 ENCOUNTER — Inpatient Hospital Stay: Payer: Medicare Other | Attending: Internal Medicine

## 2014-12-18 LAB — HM DIABETES EYE EXAM

## 2014-12-26 ENCOUNTER — Emergency Department: Payer: Medicare Other

## 2014-12-26 ENCOUNTER — Emergency Department
Admission: EM | Admit: 2014-12-26 | Discharge: 2014-12-26 | Disposition: A | Discharge status: Home / Self Care | Payer: Medicare Other | Attending: Emergency Medicine | Admitting: Emergency Medicine

## 2014-12-26 ENCOUNTER — Encounter: Payer: Self-pay | Admitting: Emergency Medicine

## 2014-12-26 ENCOUNTER — Telehealth: Payer: Self-pay | Admitting: Internal Medicine

## 2014-12-26 DIAGNOSIS — M069 Rheumatoid arthritis, unspecified: Secondary | ICD-10-CM | POA: Insufficient documentation

## 2014-12-26 DIAGNOSIS — S60221A Contusion of right hand, initial encounter: Secondary | ICD-10-CM | POA: Diagnosis not present

## 2014-12-26 DIAGNOSIS — Z794 Long term (current) use of insulin: Secondary | ICD-10-CM | POA: Diagnosis not present

## 2014-12-26 DIAGNOSIS — E119 Type 2 diabetes mellitus without complications: Secondary | ICD-10-CM | POA: Diagnosis not present

## 2014-12-26 DIAGNOSIS — Z87891 Personal history of nicotine dependence: Secondary | ICD-10-CM | POA: Insufficient documentation

## 2014-12-26 DIAGNOSIS — Z880 Allergy status to penicillin: Secondary | ICD-10-CM | POA: Diagnosis not present

## 2014-12-26 DIAGNOSIS — X58XXXA Exposure to other specified factors, initial encounter: Secondary | ICD-10-CM | POA: Insufficient documentation

## 2014-12-26 DIAGNOSIS — M10041 Idiopathic gout, right hand: Secondary | ICD-10-CM | POA: Insufficient documentation

## 2014-12-26 DIAGNOSIS — I10 Essential (primary) hypertension: Secondary | ICD-10-CM | POA: Diagnosis not present

## 2014-12-26 DIAGNOSIS — M19041 Primary osteoarthritis, right hand: Secondary | ICD-10-CM | POA: Diagnosis not present

## 2014-12-26 DIAGNOSIS — Z79899 Other long term (current) drug therapy: Secondary | ICD-10-CM | POA: Insufficient documentation

## 2014-12-26 DIAGNOSIS — Y9289 Other specified places as the place of occurrence of the external cause: Secondary | ICD-10-CM | POA: Diagnosis not present

## 2014-12-26 DIAGNOSIS — Y9389 Activity, other specified: Secondary | ICD-10-CM | POA: Insufficient documentation

## 2014-12-26 DIAGNOSIS — Y998 Other external cause status: Secondary | ICD-10-CM | POA: Diagnosis not present

## 2014-12-26 DIAGNOSIS — M25531 Pain in right wrist: Secondary | ICD-10-CM | POA: Diagnosis present

## 2014-12-26 MED ORDER — NAPROXEN 500 MG PO TABS: 500.0000 mg | ORAL_TABLET | Freq: Two times a day (BID) | ORAL | Status: DC

## 2014-12-26 MED ORDER — NAPROXEN SODIUM 550 MG PO TABS: 550.0000 mg | ORAL_TABLET | Freq: Two times a day (BID) | ORAL | Status: DC

## 2014-12-26 MED ORDER — KETOROLAC TROMETHAMINE 30 MG/ML IJ SOLN
30.0000 mg | Freq: Once | INTRAMUSCULAR | Status: AC
  Administered 2014-12-26: 30 mg via INTRAMUSCULAR
  Filled 2014-12-26: qty 1

## 2014-12-26 MED ORDER — HYDROCODONE-ACETAMINOPHEN 5-325 MG PO TABS: 1.0000 | ORAL_TABLET | ORAL | Status: DC | PRN

## 2014-12-26 MED ORDER — COLCHICINE 0.6 MG PO TABS: 1.2000 mg | ORAL_TABLET | Freq: Every day | ORAL | Status: DC

## 2014-12-26 NOTE — Telephone Encounter (Signed)
Patient Name: LASHUN MCCANTS DOB: 03-16-1928 Initial Comment Caller states father in law c/o right wrist being swollen, painful and throbbing; quite purple; wants appt; not with him; she is going to him; Nurse Assessment Nurse: Ronnald Ramp, RN, Miranda Date/Time (Eastern Time): 12/26/2014 2:22:02 PM Confirm and document reason for call. If symptomatic, describe symptoms. ---Caller states her father-in-law is c/o pain in his right wrist. Also with swelling and discoloration. No known trauma. She is not with him and has not examined him. Has the patient traveled out of the country within the last 30 days? ---Not Applicable Does the patient require triage? ---No Please document clinical information provided and list any resource used. ---Told caller she would need to call back once she is with the pt. Guidelines Guideline Title Affirmed Question Affirmed Notes Final Disposition User Clinical Call Ronnald Ramp, RN, [employer]

## 2014-12-26 NOTE — ED Notes (Signed)
Says pain in right wrist with swelling.  No injury, but was cutting up vegetables yest eve before it started.

## 2014-12-26 NOTE — Telephone Encounter (Signed)
Agree,  Given his history of atrial fib and embolic  cva need to rule out ischemic limb
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
## 2014-12-26 NOTE — Discharge Instructions (Signed)
Arthritis, Nonspecific °Arthritis is inflammation of a joint. This usually means pain, redness, warmth or swelling are present. One or more joints may be involved. There are a number of types of arthritis. Your caregiver may not be able to tell what type of arthritis you have right away. °CAUSES  °The most common cause of arthritis is the wear and tear on the joint (osteoarthritis). This causes damage to the cartilage, which can break down over time. The knees, hips, back and neck are most often affected by this type of arthritis. °Other types of arthritis and common causes of joint pain include: °· Sprains and other injuries near the joint. Sometimes minor sprains and injuries cause pain and swelling that develop hours later. °· Rheumatoid arthritis. This affects hands, feet and knees. It usually affects both sides of your body at the same time. It is often associated with chronic ailments, fever, weight loss and general weakness. °· Crystal arthritis. Gout and pseudo gout can cause occasional acute severe pain, redness and swelling in the foot, ankle, or knee. °· Infectious arthritis. Bacteria can get into a joint through a break in overlying skin. This can cause infection of the joint. Bacteria and viruses can also spread through the blood and affect your joints. °· Drug, infectious and allergy reactions. Sometimes joints can become mildly painful and slightly swollen with these types of illnesses. °SYMPTOMS  °· Pain is the main symptom. °· Your joint or joints can also be red, swollen and warm or hot to the touch. °· You may have a fever with certain types of arthritis, or even feel overall ill. °· The joint with arthritis will hurt with movement. Stiffness is present with some types of arthritis. °DIAGNOSIS  °Your caregiver will suspect arthritis based on your description of your symptoms and on your exam. Testing may be needed to find the type of arthritis: °· Blood and sometimes urine tests. °· X-ray tests  and sometimes CT or MRI scans. °· Removal of fluid from the joint (arthrocentesis) is done to check for bacteria, crystals or other causes. Your caregiver (or a specialist) will numb the area over the joint with a local anesthetic, and use a needle to remove joint fluid for examination. This procedure is only minimally uncomfortable. °· Even with these tests, your caregiver may not be able to tell what kind of arthritis you have. Consultation with a specialist (rheumatologist) may be helpful. °TREATMENT  °Your caregiver will discuss with you treatment specific to your type of arthritis. If the specific type cannot be determined, then the following general recommendations may apply. °Treatment of severe joint pain includes: °· Rest. °· Elevation. °· Anti-inflammatory medication (for example, ibuprofen) may be prescribed. Avoiding activities that cause increased pain. °· Only take over-the-counter or prescription medicines for pain and discomfort as recommended by your caregiver. °· Cold packs over an inflamed joint may be used for 10 to 15 minutes every hour. Hot packs sometimes feel better, but do not use overnight. Do not use hot packs if you are diabetic without your caregiver's permission. °· A cortisone shot into arthritic joints may help reduce pain and swelling. °· Any acute arthritis that gets worse over the next 1 to 2 days needs to be looked at to be sure there is no joint infection. °Long-term arthritis treatment involves modifying activities and lifestyle to reduce joint stress jarring. This can include weight loss. Also, exercise is needed to nourish the joint cartilage and remove waste. This helps keep the muscles   around the joint strong. HOME CARE INSTRUCTIONS   Do not take aspirin to relieve pain if gout is suspected. This elevates uric acid levels.  Only take over-the-counter or prescription medicines for pain, discomfort or fever as directed by your caregiver.  Rest the joint as much as  possible.  If your joint is swollen, keep it elevated.  Use crutches if the painful joint is in your leg.  Drinking plenty of fluids may help for certain types of arthritis.  Follow your caregiver's dietary instructions.  Try low-impact exercise such as:  Swimming.  Water aerobics.  Biking.  Walking.  Morning stiffness is often relieved by a warm shower.  Put your joints through regular range-of-motion. SEEK MEDICAL CARE IF:   You do not feel better in 24 hours or are getting worse.  You have side effects to medications, or are not getting better with treatment. SEEK IMMEDIATE MEDICAL CARE IF:   You have a fever.  You develop severe joint pain, swelling or redness.  Many joints are involved and become painful and swollen.  There is severe back pain and/or leg weakness.  You have loss of bowel or bladder control. Document Released: 07/04/2004 Document Revised: 08/19/2011 Document Reviewed: 07/20/2008 [facility] Patient Information 2015 [place], [place]. This information is not intended to replace advice given to you by your health care provider. Make sure you discuss any questions you have with your health care provider.  Gout Gout is an inflammatory arthritis caused by a buildup of uric acid crystals in the joints. Uric acid is a chemical that is normally present in the blood. When the level of uric acid in the blood is too high it can form crystals that deposit in your joints and tissues. This causes joint redness, soreness, and swelling (inflammation). Repeat attacks are common. Over time, uric acid crystals can form into masses (tophi) near a joint, destroying bone and causing disfigurement. Gout is treatable and often preventable. CAUSES  The disease begins with elevated levels of uric acid in the blood. Uric acid is produced by your body when it breaks down a naturally found substance called purines. Certain foods you eat, such as meats and fish, contain high amounts of  purines. Causes of an elevated uric acid level include:  Being passed down from parent to child (heredity).  Diseases that cause increased uric acid production (such as obesity, psoriasis, and certain cancers).  Excessive alcohol use.  Diet, especially diets rich in meat and seafood.  Medicines, including certain cancer-fighting medicines (chemotherapy), water pills (diuretics), and aspirin.  Chronic kidney disease. The kidneys are no longer able to remove uric acid well.  Problems with metabolism. Conditions strongly associated with gout include:  Obesity.  High blood pressure.  High cholesterol.  Diabetes. Not everyone with elevated uric acid levels gets gout. It is not understood why some people get gout and others do not. Surgery, joint injury, and eating too much of certain foods are some of the factors that can lead to gout attacks. SYMPTOMS   An attack of gout comes on quickly. It causes intense pain with redness, swelling, and warmth in a joint.  Fever can occur.  Often, only one joint is involved. Certain joints are more commonly involved:  Base of the big toe.  Knee.  Ankle.  Wrist.  Finger. Without treatment, an attack usually goes away in a few days to weeks. Between attacks, you usually will not have symptoms, which is different from many other forms of arthritis. DIAGNOSIS  Your caregiver will suspect gout based on your symptoms and exam. In some cases, tests may be recommended. The tests may include:  Blood tests.  Urine tests.  X-rays.  Joint fluid exam. This exam requires a needle to remove fluid from the joint (arthrocentesis). Using a microscope, gout is confirmed when uric acid crystals are seen in the joint fluid. TREATMENT  There are two phases to gout treatment: treating the sudden onset (acute) attack and preventing attacks (prophylaxis).  Treatment of an Acute Attack.  Medicines are used. These include anti-inflammatory medicines or  steroid medicines.  An injection of steroid medicine into the affected joint is sometimes necessary.  The painful joint is rested. Movement can worsen the arthritis.  You may use warm or cold treatments on painful joints, depending which works best for you.  Treatment to Prevent Attacks.  If you suffer from frequent gout attacks, your caregiver may advise preventive medicine. These medicines are started after the acute attack subsides. These medicines either help your kidneys eliminate uric acid from your body or decrease your uric acid production. You may need to stay on these medicines for a very long time.  The early phase of treatment with preventive medicine can be associated with an increase in acute gout attacks. For this reason, during the first few months of treatment, your caregiver may also advise you to take medicines usually used for acute gout treatment. Be sure you understand your caregiver's directions. Your caregiver may make several adjustments to your medicine dose before these medicines are effective.  Discuss dietary treatment with your caregiver or dietitian. Alcohol and drinks high in sugar and fructose and foods such as meat, poultry, and seafood can increase uric acid levels. Your caregiver or dietitian can advise you on drinks and foods that should be limited. HOME CARE INSTRUCTIONS   Do not take aspirin to relieve pain. This raises uric acid levels.  Only take over-the-counter or prescription medicines for pain, discomfort, or fever as directed by your caregiver.  Rest the joint as much as possible. When in bed, keep sheets and blankets off painful areas.  Keep the affected joint raised (elevated).  Apply warm or cold treatments to painful joints. Use of warm or cold treatments depends on which works best for you.  Use crutches if the painful joint is in your leg.  Drink enough fluids to keep your urine clear or pale yellow. This helps your body get rid of uric  acid. Limit alcohol, sugary drinks, and fructose drinks.  Follow your dietary instructions. Pay careful attention to the amount of protein you eat. Your daily diet should emphasize fruits, vegetables, whole grains, and fat-free or low-fat milk products. Discuss the use of coffee, vitamin C, and cherries with your caregiver or dietitian. These may be helpful in lowering uric acid levels.  Maintain a healthy body weight. SEEK MEDICAL CARE IF:   You develop diarrhea, vomiting, or any side effects from medicines.  You do not feel better in 24 hours, or you are getting worse. SEEK IMMEDIATE MEDICAL CARE IF:   Your joint becomes suddenly more tender, and you have chills or a fever. MAKE SURE YOU:   Understand these instructions.  Will watch your condition.  Will get help right away if you are not doing well or get worse. Document Released: 05/24/2000 Document Revised: 10/11/2013 Document Reviewed: 01/08/2012 [facility] Patient Information 2015 [place], [place]. This information is not intended to replace advice given to you by your health care provider. Make  sure you discuss any questions you have with your health care provider.

## 2014-12-26 NOTE — Telephone Encounter (Addendum)
Right hand swollen on both  Sides and red is not hot to touch  Was fine last night but is having trouble using his hand stated that it is very painful has tried aleve with no relief.  Advised patient with that much pain and hand turning purple go TO ED now. Patient agreed.

## 2014-12-26 NOTE — ED Notes (Signed)
Per patient request prescriptions were called to his pharmacy at [facility].  Called and talked with the pharmacist [name] and prescriptions were called as written by Wynema Birch, PA-C Naproxen 500 mg tabs,  Take 1 po twice daily with meals.  #30,  no refills Colchicine 0.6mg  tabs,  Take 2 tablets, wait one hour and take one more, #3, no refills.   prescriptions were voided.  Pt was given rx for norco because the pharmacist needed a copy.  And pt is aware that he will have to drop this one off.

## 2014-12-27 DIAGNOSIS — Z8673 Personal history of transient ischemic attack (TIA), and cerebral infarction without residual deficits: Secondary | ICD-10-CM | POA: Diagnosis not present

## 2015-01-02 DIAGNOSIS — H3530 Unspecified macular degeneration: Secondary | ICD-10-CM | POA: Diagnosis not present

## 2015-01-18 ENCOUNTER — Ambulatory Visit (INDEPENDENT_AMBULATORY_CARE_PROVIDER_SITE_OTHER): Payer: Medicare Other | Admitting: Internal Medicine

## 2015-01-18 ENCOUNTER — Encounter: Payer: Self-pay | Admitting: Internal Medicine

## 2015-01-18 ENCOUNTER — Other Ambulatory Visit: Payer: Self-pay | Admitting: Internal Medicine

## 2015-01-18 VITALS — BP 110/60 | HR 61 | Temp 98.1°F | Resp 14 | Wt 225.5 lb

## 2015-01-18 DIAGNOSIS — M1380 Other specified arthritis, unspecified site: Secondary | ICD-10-CM

## 2015-01-18 DIAGNOSIS — I69339 Monoplegia of upper limb following cerebral infarction affecting unspecified side: Secondary | ICD-10-CM

## 2015-01-18 DIAGNOSIS — M129 Arthropathy, unspecified: Secondary | ICD-10-CM | POA: Diagnosis not present

## 2015-01-18 DIAGNOSIS — M1990 Unspecified osteoarthritis, unspecified site: Secondary | ICD-10-CM

## 2015-01-18 DIAGNOSIS — E1142 Type 2 diabetes mellitus with diabetic polyneuropathy: Secondary | ICD-10-CM

## 2015-01-18 DIAGNOSIS — H353190 Nonexudative age-related macular degeneration, unspecified eye, stage unspecified: Secondary | ICD-10-CM

## 2015-01-18 DIAGNOSIS — E08621 Diabetes mellitus due to underlying condition with foot ulcer: Secondary | ICD-10-CM | POA: Diagnosis not present

## 2015-01-18 DIAGNOSIS — IMO0002 Reserved for concepts with insufficient information to code with codable children: Secondary | ICD-10-CM

## 2015-01-18 DIAGNOSIS — H3530 Unspecified macular degeneration: Secondary | ICD-10-CM

## 2015-01-18 DIAGNOSIS — D500 Iron deficiency anemia secondary to blood loss (chronic): Secondary | ICD-10-CM | POA: Diagnosis not present

## 2015-01-18 DIAGNOSIS — G8320 Monoplegia of upper limb affecting unspecified side: Secondary | ICD-10-CM

## 2015-01-18 DIAGNOSIS — R6889 Other general symptoms and signs: Secondary | ICD-10-CM

## 2015-01-18 DIAGNOSIS — E1121 Type 2 diabetes mellitus with diabetic nephropathy: Secondary | ICD-10-CM

## 2015-01-18 DIAGNOSIS — E1149 Type 2 diabetes mellitus with other diabetic neurological complication: Secondary | ICD-10-CM

## 2015-01-18 DIAGNOSIS — E1165 Type 2 diabetes mellitus with hyperglycemia: Secondary | ICD-10-CM

## 2015-01-18 DIAGNOSIS — D631 Anemia in chronic kidney disease: Secondary | ICD-10-CM | POA: Diagnosis not present

## 2015-01-18 DIAGNOSIS — R634 Abnormal weight loss: Secondary | ICD-10-CM

## 2015-01-18 DIAGNOSIS — I10 Essential (primary) hypertension: Secondary | ICD-10-CM

## 2015-01-18 DIAGNOSIS — L97529 Non-pressure chronic ulcer of other part of left foot with unspecified severity: Secondary | ICD-10-CM | POA: Diagnosis not present

## 2015-01-18 DIAGNOSIS — Z7409 Other reduced mobility: Secondary | ICD-10-CM

## 2015-01-18 DIAGNOSIS — I63411 Cerebral infarction due to embolism of right middle cerebral artery: Secondary | ICD-10-CM | POA: Diagnosis not present

## 2015-01-18 DIAGNOSIS — E785 Hyperlipidemia, unspecified: Secondary | ICD-10-CM | POA: Diagnosis not present

## 2015-01-18 DIAGNOSIS — H3531 Nonexudative age-related macular degeneration: Secondary | ICD-10-CM

## 2015-01-18 DIAGNOSIS — M064 Inflammatory polyarthropathy: Secondary | ICD-10-CM

## 2015-01-18 DIAGNOSIS — N189 Chronic kidney disease, unspecified: Secondary | ICD-10-CM

## 2015-01-18 NOTE — Progress Notes (Signed)
Pre-visit discussion using our clinic review tool. No additional management support is needed unless otherwise documented below in the visit note.  

## 2015-01-18 NOTE — Patient Instructions (Addendum)
Don't go above 3 units  On the nighttime insulin   Make sure you have a bedtime snack .   Please ask your eye doctor to send me your annual diabetic eye exam check    Please get a ;LIFELINE  amulet to wear EVERY DAY .  You can get it at the hospital.  Your son and I agree that this is CRUCIAL to maintaining your independence  I am calling [facility] to assist you at home.

## 2015-01-18 NOTE — Progress Notes (Signed)
Subjective:  Patient ID: [name], male    DOB: 1927/07/25  Age: 81 y.o. MRN: [id number]  CC: The primary encounter diagnosis was Diabetic ulcer of left foot associated with diabetes mellitus due to underlying condition. 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  HPI [name] presents for follow up chronic conditions including Type 2 DM, atrial fibrillation with prior embolic CVA, CKD with chronic anemia. Since his hospitalization and last visit in May he has lost his wife of nearly 60 years.  He continues to live independently but is having some difficulty.  He has several sons living nearby who are supportive.  He has been losing weight , however, which he attributes to "eating his own cooking."    He has several health conditions which make independent living more difficult, and he has not purchased a "[employer]" despite recalling that his son suggest hat he obtain one .  He is concerned about the cost of the service.   He has been unable to use his left hand due to severe arthritis affecting the wrist and the PIPs.  [name] He ambulates with a cane but has diabetic neuropathy and has aready lost a 5th MT on the right foot due to nonhealing diabetic foot ulcer.     He had a recent ER visit for pain in his right hand and  was treated for  inflammatory  arthritis flare of right hand with an IM injection of something an rx for colchicine which  has helped resolve the severe pain.  EF records reviewed with patient.   Plain films noted erosive arthropathy  was noted in several of the joints.   Has difficulty showering and cooking  due to use of only one hand and has difficulty cooking due to "dry"  macular degeneration affecting his vision.  He continues to drive, and denies any history of MVAs or falls.   [facility] referral discussed for home health.  DM type 2:  fasting 140 today. He notes that after a breakfast of Cereal with banana and strawberries, his  pre lunch sugar shot up to  250. He reports waking up with low blood sugar often , about  2 times week     Outpatient Prescriptions Prior to Visit  Medication Sig Dispense Refill  . Ascorbic Acid (VITAMIN C PO) Take by mouth.      . carvedilol (COREG) 6.25 MG tablet Take 1 tablet (6.25 mg total) by mouth 2 (two) times daily with a meal. 60 tablet 6  . cholecalciferol (VITAMIN D) 1000 UNITS tablet Take 1,000 Units by mouth daily.      [name] ELIQUIS 2.5 MG TABS tablet TAKE ONE TABLET TWICE A DAY 60 tablet 11  . furosemide (LASIX) 40 MG tablet Take 0.5 tablets (20 mg total) by mouth daily. 30 tablet 2  . insulin aspart (NOVOLOG) 100 UNIT/ML injection use three times daily before meals  Using sliding scale (Patient taking differently: use four times daily before meals  Using sliding scale  0-100  0 100-150   3 units 151-200   4 units 201 to 250  5 units 251 and over  7 units) 10 mL 11  . insulin glargine (LANTUS) 100 UNIT/ML injection Inject 0.5 mLs (50 Units total) into the skin daily. 10 mL 11  . Insulin Pen Needle 31G X 5 MM MISC Use as directed 100 each 5  . iron polysaccharides (NIFEREX) 150 MG capsule Take 1 capsule (150 mg total) by mouth 2 (two) times daily. 60 capsule 1  . Multiple Vitamin (MULTI-VITAMIN PO) Take by mouth.      . pantoprazole (PROTONIX) 40 MG tablet Take 1 tablet (40 mg total) by mouth daily. 30 tablet 5  . tamsulosin (FLOMAX) 0.4 MG CAPS capsule Take 1 capsule (0.4 mg total) by mouth daily after supper. 30 capsule 1  . amLODipine (NORVASC) 5 MG tablet Take 1 tablet (5  mg total) by mouth daily. (Patient not taking: Reported on 01/18/2015) 30 tablet 1  . colchicine (COLCRYS) 0.6 MG tablet Take 2 tablets (1.2 mg total) by mouth daily. Once, then wait 1 hour and take 1 more. (Patient not taking: Reported on 01/18/2015) 3 tablet 0  . HYDROcodone-acetaminophen (NORCO) 5-325 MG per tablet Take 1 tablet by mouth every 4 (four) hours as needed for moderate pain. (Patient not taking: Reported on 01/18/2015) 10 tablet 0  . losartan (COZAAR) 25 MG tablet Take 1 tablet (25 mg total) by mouth daily. (Patient not taking: Reported on 01/18/2015) 30 tablet 1  . naproxen (NAPROSYN) 500 MG tablet Take 1 tablet (500 mg total) by mouth 2 (two) times daily with a meal. (Patient not taking: Reported on 01/18/2015) 30 tablet 0  . nitroGLYCERIN (NITRODUR - DOSED IN MG/24 HR) 0.4 mg/hr patch Place 1 patch (0.4 mg total) onto the skin daily. Apply at 6 am and remove at 6 pm daily. (Patient not taking: Reported on 01/18/2015) 30 patch 1   No facility-administered medications prior to visit.    Review of Systems;  Patient denies headache, fevers, malaise, unintentional weight loss, skin rash, eye pain, sinus congestion and sinus pain, sore throat, dysphagia,  hemoptysis , cough, dyspnea, wheezing, chest pain, palpitations, orthopnea, edema, abdominal pain, nausea, melena, diarrhea, constipation, flank pain, dysuria, hematuria, urinary  Frequency, nocturia, numbness, tingling, seizures,  Focal weakness, Loss of consciousness,  Tremor, insomnia, depression, anxiety, and suicidal ideation.      Objective:  BP 110/60 mmHg  Pulse 61  Temp(Src) 98.1 F (36.7 C) (Oral)  Resp 14  Wt 225 lb 8 oz (102.286 kg)  SpO2 98%  BP Readings from Last 3 Encounters:  01/18/15 110/60  12/26/14 150/64  11/08/14 140/70    Wt Readings from Last 3 Encounters:  01/18/15 225 lb 8 oz (102.286 kg)  12/26/14 230 lb (104.327 kg)  11/08/14 238 lb 8 oz (108.183 kg)    General appearance: alert, cooperative and  appears stated age Ears: normal TM's and external ear canals both ears Throat: lips, mucosa, and tongue normal; teeth and gums normal Neck: no adenopathy, no carotid bruit, supple, symmetrical, trachea midline and thyroid not enlarged, symmetric, no tenderness/mass/nodules Back: symmetric, no curvature. ROM normal. No CVA tenderness. Lungs: clear to auscultation bilaterally Heart: regular rate and rhythm, S1, S2 normal, no murmur, click, rub or gallop Abdomen: soft, non-tender; bowel sounds normal; no masses,  no organomegaly Pulses: 2+ and symmetric Skin: Skin color, texture, turgor normal. No rashes or lesions Lymph nodes: Cervical, supraclavicular, and axillary nodes normal.  Lab Results  Component Value Date   HGBA1C 6.5 01/18/2015   HGBA1C 6.2 10/12/2014   HGBA1C 7.8* 07/12/2014    Lab  Results  Component Value Date   CREATININE 2.11* 01/18/2015   CREATININE 1.92* 10/12/2014   CREATININE 1.93* 09/14/2014    Lab Results  Component Value Date   WBC 6.5 01/18/2015   HGB 7.3* 01/18/2015   HCT 22.6* 01/18/2015   PLT 285.0 01/18/2015   GLUCOSE 94 01/18/2015   CHOL 129 01/18/2015   TRIG 58.0 01/18/2015   HDL 45.00 01/18/2015   LDLDIRECT 71.0 01/18/2015   LDLCALC 72 01/18/2015   ALT 11 01/18/2015   AST 16 01/18/2015   NA 138 01/18/2015   K 4.9 01/18/2015   CL 104 01/18/2015   CREATININE 2.11* 01/18/2015   BUN 42* 01/18/2015   CO2 24 01/18/2015   TSH 3.53 09/07/2014   PSA 0.48 09/07/2014   INR 1.0 06/27/2014   HGBA1C 6.5 01/18/2015   MICROALBUR 33.5* 10/12/2014    Dg Hand Complete Right  12/26/2014   CLINICAL DATA:  Severe right hand pain.  No trauma.  EXAM: RIGHT HAND - COMPLETE 3+ VIEW  COMPARISON:  None.  FINDINGS: There is no evidence of fracture or dislocation. There are decreased joint space with associated erosion involving the proximal and distal interphalangeal joints of the second through fourth digits. There are erosive changes of the distal first  metacarpal. There is periarticular soft tissue swelling of the second through fourth digits.  IMPRESSION: No acute fracture or dislocation. Arthritic changes of the right hand as described. Differential diagnosis includes rheumatoid arthritis.   Electronically Signed   By: Abelardo Diesel M.D.   On: 12/26/2014 17:20    Assessment & Plan:   Problem List Items Addressed This Visit      Unprioritized   Anemia in chronic kidney disease    He has not had a transfusion or iron of PBCs since May.  Managed by  Dr. Ma Hillock at [facility] who gave IV Iron Venofer 500 MG x 1 on March 11 for HGB 9.0 AND low iron stores. r Pandit's last note indicates that Procrit and other alternatives are C/I secondary to recent CVA January 2016,  But atrial fibrillation was considered the cuase of the embolic stroke,  And hgb has fallen to 7.6  Agree with Dr Acie Fredrickson that the benefits of Procrit outweigh the risks.  Repeat hgb is critically low and Dr. Ma Hillock is arranging transfusion.       HTN (hypertension)    Well controlled on current regimen. Renal function stable, no changes today.  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 He has not actually checked his BS during these events but keeps several cookies on his nightstand which he eats when he is woken by symptoms.  I have reduced his sliding scale insulin at dinner to 3 units and  Recommended a bedtime snack every night   .  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 He does not walk barefoot in the home or outside      RESOLVED: Diabetic ulcer of left foot associated with diabetes mellitus due to underlying condition - Primary    Foot exam shows well healed amputation of right 5th toe with no ulcers,.  Decreased sensation  noted,  No burnign       Macular degeneration, dry    He has difficulty at home due to decreased vision  And would benefit from assistance       Weight loss    multifactorial , including grief from loss of wife, difficulty functioning independently with regard to preparing meals due to left arm weakness, right hand arthritis,  Macular degeneration.  Referral to [facility] in process.       Arthritis    Ersoive,  By plain films of right hand.  Checking serologies for gout,  RA, etc. Uric acid level is normal at 6.2 but RA is quite elevated at 104 and ESR is 35. Will refer to Dr.  Jefm Bryant for evaluation.   No results found for: URICACID Lab Results  Component Value Date   RF 104* 01/18/2015         Relevant Orders   Uric acid (Completed)   Sedimentation rate (Completed)   Rheumatoid factor (Completed)   Cyclic citrul peptide antibody, IgG (Completed)   ANA w/Reflex if Positive   Ambulatory referral to [facility]    Other Visit Diagnoses    Type II diabetes mellitus, uncontrolled        Relevant Orders    Hemoglobin A1c (Completed)    Lipid panel (Completed)    LDL cholesterol, direct (Completed)    Comprehensive metabolic panel (Completed)    Anemia due to chronic blood loss        Relevant Orders    CBC with Differential/Platelet (Completed)    Loss of weight        Relevant Orders    Ambulatory referral to [facility]    Decreased ambulation status        Relevant  Orders    Ambulatory referral to [facility] degeneration        Relevant Orders    Ambulatory referral to [facility]    Inflammatory arthritis        Relevant Orders    Ambulatory referral to Rheumatology     A total of 40 minutes was spent with patient more than half of which was spent in counseling patient on the above mentioned issues , reviewing and explaining recent labs and imaging studies done, and coordination of care.   I am having [name] maintain his cholecalciferol, Ascorbic Acid (VITAMIN C PO), Multiple Vitamin (MULTI-VITAMIN PO), insulin glargine, tamsulosin, losartan, iron polysaccharides, amLODipine, nitroGLYCERIN, carvedilol, Insulin Pen Needle, furosemide, insulin aspart, pantoprazole, ELIQUIS, colchicine, HYDROcodone-acetaminophen, and naproxen.  No orders of the defined types were placed in this encounter.    There are no discontinued medications.  Follow-up: Return in about 3 months (around 04/20/2015) for follow up diabetes.   [name], MD

## 2015-01-18 NOTE — Assessment & Plan Note (Signed)
Foot exam shows well healed amputation of right 5th toe with no ulcers,.  Decreased sensation  noted,  No burnign

## 2015-01-19 ENCOUNTER — Telehealth: Payer: Self-pay | Admitting: *Deleted

## 2015-01-19 LAB — LDL CHOLESTEROL, DIRECT: Direct LDL: 71 mg/dL

## 2015-01-19 LAB — COMPREHENSIVE METABOLIC PANEL
ALT: 11 U/L (ref 0–53)
AST: 16 U/L (ref 0–37)
Albumin: 3.8 g/dL (ref 3.5–5.2)
Alkaline Phosphatase: 84 U/L (ref 39–117)
BUN: 42 mg/dL — ABNORMAL HIGH (ref 6–23)
CALCIUM: 9 mg/dL (ref 8.4–10.5)
CHLORIDE: 104 meq/L (ref 96–112)
CO2: 24 mEq/L (ref 19–32)
Creatinine, Ser: 2.11 mg/dL — ABNORMAL HIGH (ref 0.40–1.50)
GFR: 31.85 mL/min — ABNORMAL LOW (ref >60.00)
GLUCOSE: 94 mg/dL (ref 70–99)
Potassium: 4.9 mEq/L (ref 3.5–5.1)
SODIUM: 138 meq/L (ref 135–145)
Total Bilirubin: 0.5 mg/dL (ref 0.2–1.2)
Total Protein: 6.6 g/dL (ref 6.0–8.3)

## 2015-01-19 LAB — CBC WITH DIFFERENTIAL/PLATELET
BASOS PCT: 0.5 % (ref 0.0–3.0)
Basophils Absolute: 0 10*3/uL (ref 0.0–0.1)
EOS PCT: 5.4 % — AB (ref 0.0–5.0)
Eosinophils Absolute: 0.4 10*3/uL (ref 0.0–0.7)
HCT: 22.6 % — CL (ref 39.0–52.0)
Hemoglobin: 7.3 g/dL — CL (ref 13.0–17.0)
Lymphocytes Relative: 12 % (ref 12.0–46.0)
Lymphs Abs: 0.8 10*3/uL (ref 0.7–4.0)
MCHC: 32.2 g/dL (ref 30.0–36.0)
MCV: 84.4 fl (ref 78.0–100.0)
MONO ABS: 0.5 10*3/uL (ref 0.1–1.0)
MONOS PCT: 8.2 % (ref 3.0–12.0)
NEUTROS PCT: 73.9 % (ref 43.0–77.0)
Neutro Abs: 4.8 10*3/uL (ref 1.4–7.7)
Platelets: 285 10*3/uL (ref 150.0–400.0)
RBC: 2.67 Mil/uL — AB (ref 4.22–5.81)
RDW: 16.4 % — ABNORMAL HIGH (ref 11.5–15.5)
WBC: 6.5 10*3/uL (ref 4.0–10.5)

## 2015-01-19 LAB — LIPID PANEL
CHOL/HDL RATIO: 3
Cholesterol: 129 mg/dL (ref 0–200)
HDL: 45 mg/dL (ref >39.00)
LDL CALC: 72 mg/dL (ref 0–99)
NONHDL: 83.52
Triglycerides: 58 mg/dL (ref 0.0–149.0)
VLDL: 11.6 mg/dL (ref 0.0–40.0)

## 2015-01-19 LAB — URIC ACID: URIC ACID, SERUM: 6.2 mg/dL (ref 4.0–7.8)

## 2015-01-19 LAB — RHEUMATOID FACTOR: RHEUMATOID FACTOR: 104 [IU]/mL — AB (ref <=14)

## 2015-01-19 LAB — HEMOGLOBIN A1C: Hgb A1c MFr Bld: 6.5 % (ref 4.6–6.5)

## 2015-01-19 LAB — SEDIMENTATION RATE: Sed Rate: 35 mm/hr — ABNORMAL HIGH (ref 0–22)

## 2015-01-19 NOTE — Telephone Encounter (Signed)
Hemoglobin 7.3  Hematocrit  22.6

## 2015-01-19 NOTE — Telephone Encounter (Signed)
Spoke with Dr. Ma Hillock. His office will call [name] and plan for PRBC transfusion, possibly tomorrow.

## 2015-01-19 NOTE — Telephone Encounter (Signed)
Spoke with pt and discussed lab results. Called Dr. Beverly Gust office and left message for him to call back regarding earlier follow up of worsening anemia.

## 2015-01-20 ENCOUNTER — Inpatient Hospital Stay: Payer: Medicare Other | Attending: Internal Medicine

## 2015-01-20 VITALS — BP 152/68 | HR 74 | Temp 96.2°F | Resp 18

## 2015-01-20 DIAGNOSIS — D631 Anemia in chronic kidney disease: Secondary | ICD-10-CM | POA: Insufficient documentation

## 2015-01-20 DIAGNOSIS — H353190 Nonexudative age-related macular degeneration, unspecified eye, stage unspecified: Secondary | ICD-10-CM | POA: Insufficient documentation

## 2015-01-20 DIAGNOSIS — R634 Abnormal weight loss: Secondary | ICD-10-CM | POA: Insufficient documentation

## 2015-01-20 DIAGNOSIS — M1990 Unspecified osteoarthritis, unspecified site: Secondary | ICD-10-CM | POA: Insufficient documentation

## 2015-01-20 DIAGNOSIS — D649 Anemia, unspecified: Secondary | ICD-10-CM

## 2015-01-20 DIAGNOSIS — N189 Chronic kidney disease, unspecified: Secondary | ICD-10-CM

## 2015-01-20 LAB — ABO/RH: ABO/RH(D): O POS

## 2015-01-20 LAB — CYCLIC CITRUL PEPTIDE ANTIBODY, IGG: Cyclic Citrullin Peptide Ab: <2 U/mL (ref 0.0–5.0)

## 2015-01-20 LAB — IRON AND TIBC
Iron: 116 ug/dL (ref 45–182)
Saturation Ratios: 34 % (ref 17.9–39.5)
TIBC: 347 ug/dL (ref 250–450)
UIBC: 231 ug/dL

## 2015-01-20 LAB — FERRITIN: FERRITIN: 26 ng/mL (ref 24–336)

## 2015-01-20 LAB — HEMOGLOBIN: Hemoglobin: 7.3 g/dL — ABNORMAL LOW (ref 13.0–18.0)

## 2015-01-20 LAB — PREPARE RBC (CROSSMATCH)

## 2015-01-20 MED ORDER — ACETAMINOPHEN 325 MG PO TABS
650.0000 mg | ORAL_TABLET | Freq: Once | ORAL | Status: AC
  Administered 2015-01-20: 650 mg via ORAL
  Filled 2015-01-20: qty 2

## 2015-01-20 MED ORDER — DIPHENHYDRAMINE HCL 25 MG PO CAPS
25.0000 mg | ORAL_CAPSULE | Freq: Once | ORAL | Status: AC
  Administered 2015-01-20: 25 mg via ORAL
  Filled 2015-01-20: qty 1

## 2015-01-20 MED ORDER — SODIUM CHLORIDE 0.9 % IV SOLN
250.0000 mL | Freq: Once | INTRAVENOUS | Status: AC
  Administered 2015-01-20: 250 mL via INTRAVENOUS
  Filled 2015-01-20: qty 250

## 2015-01-20 NOTE — Assessment & Plan Note (Signed)
Well controlled on current regimen. Renal function stable, no changes today.  Lab Results  Component Value Date   CREATININE 2.11* 01/18/2015   Lab Results  Component Value Date   NA 138 01/18/2015   K 4.9 01/18/2015   CL 104 01/18/2015   CO2 24 01/18/2015

## 2015-01-20 NOTE — Assessment & Plan Note (Signed)
He continues to have limited use of left arm

## 2015-01-20 NOTE — Assessment & Plan Note (Signed)
He has a dense sensory loss over both feet, but his feet are in good shape currently,  His prior MT amputation has healed well. He does not walk barefoot in the home or outside

## 2015-01-20 NOTE — Assessment & Plan Note (Signed)
Ersoive,  By plain films of right hand.  Checking serologies for gout,  RA, etc. Uric acid level is normal at 6.2 but RA is quite elevated at 104 and ESR is 35. Will refer to Dr.  Jefm Bryant for evaluation.   No results found for: URICACID Lab Results  Component Value Date   RF 104* 01/18/2015

## 2015-01-20 NOTE — Assessment & Plan Note (Addendum)
He has not had a transfusion or iron of PBCs since May.  Managed by  Dr. Ma Hillock at [facility] who gave IV Iron Venofer 500 MG x 1 on March 11 for HGB 9.0 AND low iron stores. r Pandit's last note indicates that Procrit and other alternatives are C/I secondary to recent CVA January 2016,  But atrial fibrillation was considered the cuase of the embolic stroke,  And hgb has fallen to 7.6  Agree with Dr Acie Fredrickson that the benefits of Procrit outweigh the risks.  Repeat hgb is critically low and Dr. Ma Hillock is arranging transfusion.

## 2015-01-20 NOTE — Assessment & Plan Note (Addendum)
His recurrent hypoglycemic events were discussed today. He has not actually checked his BS during these events but keeps several cookies on his nightstand which he eats when he is woken by symptoms.  I have reduced his sliding scale insulin at dinner to 3 units and  Recommended a bedtime snack every night   .  Lab Results  Component Value Date   HGBA1C 6.5 01/18/2015   Lab Results  Component Value Date   MICROALBUR 33.5* 10/12/2014   Lab Results  Component Value Date   CREATININE 2.11* 01/18/2015

## 2015-01-20 NOTE — Assessment & Plan Note (Addendum)
multifactorial , including grief from loss of wife, difficulty functioning independently with regard to preparing meals due to left arm weakness, right hand arthritis,  Macular degeneration.  Referral to [facility] in process.

## 2015-01-20 NOTE — Assessment & Plan Note (Signed)
He has difficulty at home due to decreased vision  And would benefit from assistance

## 2015-01-20 NOTE — Telephone Encounter (Signed)
Thank you for handling [name]'s critical hgb

## 2015-01-22 DIAGNOSIS — M069 Rheumatoid arthritis, unspecified: Secondary | ICD-10-CM | POA: Diagnosis not present

## 2015-01-22 DIAGNOSIS — N400 Enlarged prostate without lower urinary tract symptoms: Secondary | ICD-10-CM | POA: Diagnosis not present

## 2015-01-22 DIAGNOSIS — Z794 Long term (current) use of insulin: Secondary | ICD-10-CM | POA: Diagnosis not present

## 2015-01-22 DIAGNOSIS — I4891 Unspecified atrial fibrillation: Secondary | ICD-10-CM | POA: Diagnosis not present

## 2015-01-22 DIAGNOSIS — Z7902 Long term (current) use of antithrombotics/antiplatelets: Secondary | ICD-10-CM | POA: Diagnosis not present

## 2015-01-22 DIAGNOSIS — I69332 Monoplegia of upper limb following cerebral infarction affecting left dominant side: Secondary | ICD-10-CM | POA: Diagnosis not present

## 2015-01-22 DIAGNOSIS — N183 Chronic kidney disease, stage 3 (moderate): Secondary | ICD-10-CM | POA: Diagnosis not present

## 2015-01-22 DIAGNOSIS — I2510 Atherosclerotic heart disease of native coronary artery without angina pectoris: Secondary | ICD-10-CM | POA: Diagnosis not present

## 2015-01-22 DIAGNOSIS — I350 Nonrheumatic aortic (valve) stenosis: Secondary | ICD-10-CM | POA: Diagnosis not present

## 2015-01-22 DIAGNOSIS — H3530 Unspecified macular degeneration: Secondary | ICD-10-CM | POA: Diagnosis not present

## 2015-01-22 DIAGNOSIS — E1122 Type 2 diabetes mellitus with diabetic chronic kidney disease: Secondary | ICD-10-CM | POA: Diagnosis not present

## 2015-01-22 DIAGNOSIS — E1142 Type 2 diabetes mellitus with diabetic polyneuropathy: Secondary | ICD-10-CM | POA: Diagnosis not present

## 2015-01-22 DIAGNOSIS — Z9181 History of falling: Secondary | ICD-10-CM | POA: Diagnosis not present

## 2015-01-22 DIAGNOSIS — M1990 Unspecified osteoarthritis, unspecified site: Secondary | ICD-10-CM | POA: Diagnosis not present

## 2015-01-22 DIAGNOSIS — D649 Anemia, unspecified: Secondary | ICD-10-CM | POA: Diagnosis not present

## 2015-01-22 DIAGNOSIS — M19041 Primary osteoarthritis, right hand: Secondary | ICD-10-CM | POA: Diagnosis not present

## 2015-01-22 DIAGNOSIS — M10031 Idiopathic gout, right wrist: Secondary | ICD-10-CM | POA: Diagnosis not present

## 2015-01-22 DIAGNOSIS — I129 Hypertensive chronic kidney disease with stage 1 through stage 4 chronic kidney disease, or unspecified chronic kidney disease: Secondary | ICD-10-CM | POA: Diagnosis not present

## 2015-01-22 LAB — TYPE AND SCREEN
ABO/RH(D): O POS
ANTIBODY SCREEN: NEGATIVE
Unit division: 0

## 2015-01-23 DIAGNOSIS — M0579 Rheumatoid arthritis with rheumatoid factor of multiple sites without organ or systems involvement: Secondary | ICD-10-CM | POA: Diagnosis not present

## 2015-01-23 DIAGNOSIS — M79641 Pain in right hand: Secondary | ICD-10-CM | POA: Diagnosis not present

## 2015-01-23 DIAGNOSIS — M150 Primary generalized (osteo)arthritis: Secondary | ICD-10-CM | POA: Diagnosis not present

## 2015-01-23 LAB — ANA: Anti Nuclear Antibody(ANA): NEGATIVE

## 2015-01-25 DIAGNOSIS — E1122 Type 2 diabetes mellitus with diabetic chronic kidney disease: Secondary | ICD-10-CM | POA: Diagnosis not present

## 2015-01-25 DIAGNOSIS — E1142 Type 2 diabetes mellitus with diabetic polyneuropathy: Secondary | ICD-10-CM | POA: Diagnosis not present

## 2015-01-25 DIAGNOSIS — M10031 Idiopathic gout, right wrist: Secondary | ICD-10-CM | POA: Diagnosis not present

## 2015-01-25 DIAGNOSIS — I69332 Monoplegia of upper limb following cerebral infarction affecting left dominant side: Secondary | ICD-10-CM | POA: Diagnosis not present

## 2015-01-25 DIAGNOSIS — M19041 Primary osteoarthritis, right hand: Secondary | ICD-10-CM | POA: Diagnosis not present

## 2015-01-25 DIAGNOSIS — M069 Rheumatoid arthritis, unspecified: Secondary | ICD-10-CM | POA: Diagnosis not present

## 2015-01-27 DIAGNOSIS — M19041 Primary osteoarthritis, right hand: Secondary | ICD-10-CM | POA: Diagnosis not present

## 2015-01-27 DIAGNOSIS — E1142 Type 2 diabetes mellitus with diabetic polyneuropathy: Secondary | ICD-10-CM | POA: Diagnosis not present

## 2015-01-27 DIAGNOSIS — M10031 Idiopathic gout, right wrist: Secondary | ICD-10-CM | POA: Diagnosis not present

## 2015-01-27 DIAGNOSIS — I69332 Monoplegia of upper limb following cerebral infarction affecting left dominant side: Secondary | ICD-10-CM | POA: Diagnosis not present

## 2015-01-27 DIAGNOSIS — E1122 Type 2 diabetes mellitus with diabetic chronic kidney disease: Secondary | ICD-10-CM | POA: Diagnosis not present

## 2015-01-27 DIAGNOSIS — M069 Rheumatoid arthritis, unspecified: Secondary | ICD-10-CM | POA: Diagnosis not present

## 2015-01-30 DIAGNOSIS — I69332 Monoplegia of upper limb following cerebral infarction affecting left dominant side: Secondary | ICD-10-CM | POA: Diagnosis not present

## 2015-01-30 DIAGNOSIS — M069 Rheumatoid arthritis, unspecified: Secondary | ICD-10-CM | POA: Diagnosis not present

## 2015-01-30 DIAGNOSIS — M10031 Idiopathic gout, right wrist: Secondary | ICD-10-CM | POA: Diagnosis not present

## 2015-01-30 DIAGNOSIS — M19041 Primary osteoarthritis, right hand: Secondary | ICD-10-CM | POA: Diagnosis not present

## 2015-01-30 DIAGNOSIS — E1122 Type 2 diabetes mellitus with diabetic chronic kidney disease: Secondary | ICD-10-CM | POA: Diagnosis not present

## 2015-01-30 DIAGNOSIS — E1142 Type 2 diabetes mellitus with diabetic polyneuropathy: Secondary | ICD-10-CM | POA: Diagnosis not present

## 2015-01-31 ENCOUNTER — Ambulatory Visit (INDEPENDENT_AMBULATORY_CARE_PROVIDER_SITE_OTHER): Payer: Medicare Other | Admitting: Nurse Practitioner

## 2015-01-31 ENCOUNTER — Encounter: Payer: Self-pay | Admitting: Nurse Practitioner

## 2015-01-31 VITALS — BP 140/68 | HR 64 | Temp 98.3°F | Resp 18 | Ht 70.0 in

## 2015-01-31 DIAGNOSIS — N183 Chronic kidney disease, stage 3 unspecified: Secondary | ICD-10-CM

## 2015-01-31 DIAGNOSIS — M19041 Primary osteoarthritis, right hand: Secondary | ICD-10-CM | POA: Diagnosis not present

## 2015-01-31 DIAGNOSIS — I63411 Cerebral infarction due to embolism of right middle cerebral artery: Secondary | ICD-10-CM

## 2015-01-31 DIAGNOSIS — R109 Unspecified abdominal pain: Secondary | ICD-10-CM

## 2015-01-31 DIAGNOSIS — M10031 Idiopathic gout, right wrist: Secondary | ICD-10-CM | POA: Diagnosis not present

## 2015-01-31 DIAGNOSIS — E1122 Type 2 diabetes mellitus with diabetic chronic kidney disease: Secondary | ICD-10-CM | POA: Diagnosis not present

## 2015-01-31 DIAGNOSIS — M069 Rheumatoid arthritis, unspecified: Secondary | ICD-10-CM | POA: Diagnosis not present

## 2015-01-31 DIAGNOSIS — I69332 Monoplegia of upper limb following cerebral infarction affecting left dominant side: Secondary | ICD-10-CM | POA: Diagnosis not present

## 2015-01-31 DIAGNOSIS — E1142 Type 2 diabetes mellitus with diabetic polyneuropathy: Secondary | ICD-10-CM | POA: Diagnosis not present

## 2015-01-31 LAB — CBC WITH DIFFERENTIAL/PLATELET
BASOS PCT: 1.2 % (ref 0.0–3.0)
Basophils Absolute: 0.1 10*3/uL (ref 0.0–0.1)
EOS PCT: 4.8 % (ref 0.0–5.0)
Eosinophils Absolute: 0.2 10*3/uL (ref 0.0–0.7)
HEMATOCRIT: 25.4 % — AB (ref 39.0–52.0)
HEMOGLOBIN: 8.1 g/dL — AB (ref 13.0–17.0)
Lymphocytes Relative: 13.3 % (ref 12.0–46.0)
Lymphs Abs: 0.7 10*3/uL (ref 0.7–4.0)
MCHC: 32 g/dL (ref 30.0–36.0)
MCV: 85.1 fl (ref 78.0–100.0)
Monocytes Absolute: 0.6 10*3/uL (ref 0.1–1.0)
Monocytes Relative: 11.6 % (ref 3.0–12.0)
Neutro Abs: 3.4 10*3/uL (ref 1.4–7.7)
Neutrophils Relative %: 69.1 % (ref 43.0–77.0)
Platelets: 260 10*3/uL (ref 150.0–400.0)
RBC: 2.98 Mil/uL — ABNORMAL LOW (ref 4.22–5.81)
RDW: 15.8 % — AB (ref 11.5–15.5)
WBC: 5 10*3/uL (ref 4.0–10.5)

## 2015-01-31 LAB — POCT URINALYSIS DIPSTICK
Bilirubin, UA: NEGATIVE
Glucose, UA: 250
Ketones, UA: NEGATIVE
Leukocytes, UA: NEGATIVE
Nitrite, UA: NEGATIVE
PROTEIN UA: 100
Spec Grav, UA: 1.015
UROBILINOGEN UA: 0.2
pH, UA: 5.5

## 2015-01-31 LAB — COMPREHENSIVE METABOLIC PANEL
ALBUMIN: 3.6 g/dL (ref 3.5–5.2)
ALT: 13 U/L (ref 0–53)
AST: 19 U/L (ref 0–37)
Alkaline Phosphatase: 83 U/L (ref 39–117)
BUN: 37 mg/dL — AB (ref 6–23)
CHLORIDE: 103 meq/L (ref 96–112)
CO2: 23 meq/L (ref 19–32)
Calcium: 8.9 mg/dL (ref 8.4–10.5)
Creatinine, Ser: 1.93 mg/dL — ABNORMAL HIGH (ref 0.40–1.50)
GFR: 35.3 mL/min — ABNORMAL LOW (ref >60.00)
Glucose, Bld: 263 mg/dL — ABNORMAL HIGH (ref 70–99)
POTASSIUM: 5 meq/L (ref 3.5–5.1)
SODIUM: 134 meq/L — AB (ref 135–145)
Total Bilirubin: 0.6 mg/dL (ref 0.2–1.2)
Total Protein: 6.6 g/dL (ref 6.0–8.3)

## 2015-01-31 NOTE — Progress Notes (Signed)
Patient ID: JENO CALLEROS, male    DOB: 31-Jan-1928  Age: 81 y.o. MRN: [id number]  CC: Back Pain   HPI Cylan Borum Unangst presents for flank pain 7 days.  1) Right Flank pain  Onset- 7 days ago Location- right lower back Duration- 7 days  Characteristics- "hurts" Aggravating factors- getting up from lying or sitting position.  Relieving factors- lying down or sitting Severity- this morning 10/10 when getting up; down to a 1/10   History Khamauri has a past medical history of Aortic stenosis, severe; Hypertension; Diabetes mellitus; OA (osteoarthritis); Junctional bradycardia; BPH (benign prostatic hypertrophy); Hyperlipidemia; Anemia; Gastrointestinal bleed; CAD (coronary artery disease); Mobitz type 1 second degree atrioventricular block (10/01/2012); History of bladder cancer; and Macular degeneration.   He has past surgical history that includes Cardiac catheterization (07/16/2007); Aortic valve replacement; Coronary artery bypass graft (07/27/2007); Toe amputation; Cosmetic surgery; US ECHOCARDIOGRAPHY (09/13/2009); US ECHOCARDIOGRAPHY (09/15/2007); US ECHOCARDIOGRAPHY (07/14/2007); US ECHOCARDIOGRAPHY (01/20/2007); US ECHOCARDIOGRAPHY (07/22/2006); US ECHOCARDIOGRAPHY (07/22/2005); and Cardiovascular stress test (01/17/2005).   His family history includes Diabetes in his brother; Prostate cancer in his brother; Stroke in his father.He reports that he quit smoking about 20 years ago. He does not have any smokeless tobacco history on file. He reports that he drinks about 12.6 oz of alcohol per week. He reports that he does not use illicit drugs.  Outpatient Prescriptions Prior to Visit  Medication Sig Dispense Refill  . amLODipine (NORVASC) 5 MG tablet Take 1 tablet (5 mg total) by mouth daily. 30 tablet 1  . Ascorbic Acid (VITAMIN C PO) Take by mouth.      . carvedilol (COREG) 6.25 MG tablet Take 1 tablet (6.25 mg total) by mouth 2 (two) times daily with a meal. 60 tablet 6  . cholecalciferol (VITAMIN  D) 1000 UNITS tablet Take 1,000 Units by mouth daily.      [name] ELIQUIS 2.5 MG TABS tablet TAKE ONE TABLET TWICE A DAY 60 tablet 11  . furosemide (LASIX) 40 MG tablet Take 0.5 tablets (20 mg total) by mouth daily. 30 tablet 2  . HYDROcodone-acetaminophen (NORCO) 5-325 MG per tablet Take 1 tablet by mouth every 4 (four) hours as needed for moderate pain. 10 tablet 0  . insulin aspart (NOVOLOG) 100 UNIT/ML injection use three times daily before meals  Using sliding scale (Patient taking differently: use four times daily before meals  Using sliding scale  0-100  0 100-150   3 units 151-200   4 units 201 to 250  5 units 251 and over 7 units) 10 mL 11  . insulin glargine (LANTUS) 100 UNIT/ML injection Inject 0.5 mLs (50 Units total) into the skin daily. 10 mL 11  . Insulin Pen Needle 31G X 5 MM MISC Use as directed 100 each 5  . iron polysaccharides (NIFEREX) 150 MG capsule Take 1 capsule (150 mg total) by mouth 2 (two) times daily. 60 capsule 1  . losartan (COZAAR) 25 MG tablet Take 1 tablet (25 mg total) by mouth daily. 30 tablet 1  . Multiple Vitamin (MULTI-VITAMIN PO) Take by mouth.      . naproxen (NAPROSYN) 500 MG tablet Take 1 tablet (500 mg total) by mouth 2 (two) times daily with a meal. 30 tablet 0  . nitroGLYCERIN (NITRODUR - DOSED IN MG/24 HR) 0.4 mg/hr patch Place 1 patch (0.4 mg total) onto the skin daily. Apply at 6 am and remove at 6 pm daily. 30 patch 1  . pantoprazole (PROTONIX) 40 MG tablet Take  1 tablet (40 mg total) by mouth daily. 30 tablet 5  . tamsulosin (FLOMAX) 0.4 MG CAPS capsule Take 1 capsule (0.4 mg total) by mouth daily after supper. 30 capsule 1  . colchicine (COLCRYS) 0.6 MG tablet Take 2 tablets (1.2 mg total) by mouth daily. Once, then wait 1 hour and take 1 more. 3 tablet 0   No facility-administered medications prior to visit.    ROS Review of Systems  Constitutional: Negative for fever, chills, diaphoresis and fatigue.  Eyes: Negative for visual disturbance.    Respiratory: Negative for chest tightness, shortness of breath and wheezing.   Cardiovascular: Negative for chest pain, palpitations and leg swelling.  Musculoskeletal: Positive for arthralgias.       Right side upper lumbar spine  Skin: Negative for rash.  Neurological: Negative for weakness and numbness.       Denies losing bowel/bladder function, saddle anesthesia, or weakness of extremities.   Psychiatric/Behavioral: The patient is not nervous/anxious.     Objective:  BP 140/68 mmHg  Pulse 64  Temp(Src) 98.3 F (36.8 C)  Resp 18  Ht 5\' 10"  (1.778 m)  SpO2 98%  Physical Exam  Constitutional: He is oriented to person, place, and time. He appears well-developed and well-nourished. No distress.  HENT:  Head: Normocephalic and atraumatic.  Right Ear: External ear normal.  Left Ear: External ear normal.  Musculoskeletal:       Back:  Tender area to palpation  Neurological: He is alert and oriented to person, place, and time.  Skin: Skin is warm and dry. No rash noted. He is not diaphoretic.  Psychiatric: He has a normal mood and affect. His behavior is normal. Judgment and thought content normal.   Assessment & Plan:   Elie was seen today for back pain.  Diagnoses and all orders for this visit:  Flank pain -     POCT Urinalysis Dipstick -     Comprehensive metabolic panel -     CBC w/Diff  CKD (chronic kidney disease) stage 3, GFR 30-59 ml/min   I have discontinued Mr. Peppard's colchicine. I am also having him maintain his cholecalciferol, Ascorbic Acid (VITAMIN C PO), Multiple Vitamin (MULTI-VITAMIN PO), insulin glargine, tamsulosin, losartan, iron polysaccharides, amLODipine, nitroGLYCERIN, carvedilol, Insulin Pen Needle, furosemide, insulin aspart, pantoprazole, ELIQUIS, HYDROcodone-acetaminophen, naproxen, and hydroxychloroquine.  Meds ordered this encounter  Medications  . hydroxychloroquine (PLAQUENIL) 200 MG tablet    Sig: Take 200 mg by mouth daily.      Follow-up: Return in about 1 week (around 02/07/2015) for Flank pain .

## 2015-01-31 NOTE — Progress Notes (Signed)
Pre visit review using our clinic review tool, if applicable. No additional management support is needed unless otherwise documented below in the visit note. 

## 2015-01-31 NOTE — Patient Instructions (Signed)
Please visit the lab before leaving today.   Please try Tylenol Extra Strength over the counter one tablet every 6 hours. Heating pad is helpful for muscle pain.   Follow up in 1 week with myself or Dr. Derrel Nip.

## 2015-01-31 NOTE — Assessment & Plan Note (Signed)
Patient is off of Colcrys currently. We'll recheck C met today. POCT urine has a small amount of blood and protein otherwise normal.

## 2015-01-31 NOTE — Assessment & Plan Note (Signed)
Flank pain is probably musculoskeletal in nature. Advised patient to use heating pad on back and extra strength Tylenol 1 capsule every 6 hours as needed for back pain. Follow-up in one week with myself or Dr. Derrel Nip.

## 2015-02-01 DIAGNOSIS — M19041 Primary osteoarthritis, right hand: Secondary | ICD-10-CM | POA: Diagnosis not present

## 2015-02-01 DIAGNOSIS — I69332 Monoplegia of upper limb following cerebral infarction affecting left dominant side: Secondary | ICD-10-CM | POA: Diagnosis not present

## 2015-02-01 DIAGNOSIS — E1122 Type 2 diabetes mellitus with diabetic chronic kidney disease: Secondary | ICD-10-CM | POA: Diagnosis not present

## 2015-02-01 DIAGNOSIS — M10031 Idiopathic gout, right wrist: Secondary | ICD-10-CM | POA: Diagnosis not present

## 2015-02-01 DIAGNOSIS — E1142 Type 2 diabetes mellitus with diabetic polyneuropathy: Secondary | ICD-10-CM | POA: Diagnosis not present

## 2015-02-01 DIAGNOSIS — M069 Rheumatoid arthritis, unspecified: Secondary | ICD-10-CM | POA: Diagnosis not present

## 2015-02-02 DIAGNOSIS — E1142 Type 2 diabetes mellitus with diabetic polyneuropathy: Secondary | ICD-10-CM | POA: Diagnosis not present

## 2015-02-02 DIAGNOSIS — M19041 Primary osteoarthritis, right hand: Secondary | ICD-10-CM | POA: Diagnosis not present

## 2015-02-02 DIAGNOSIS — M10031 Idiopathic gout, right wrist: Secondary | ICD-10-CM | POA: Diagnosis not present

## 2015-02-02 DIAGNOSIS — M069 Rheumatoid arthritis, unspecified: Secondary | ICD-10-CM | POA: Diagnosis not present

## 2015-02-02 DIAGNOSIS — E1122 Type 2 diabetes mellitus with diabetic chronic kidney disease: Secondary | ICD-10-CM | POA: Diagnosis not present

## 2015-02-02 DIAGNOSIS — I69332 Monoplegia of upper limb following cerebral infarction affecting left dominant side: Secondary | ICD-10-CM | POA: Diagnosis not present

## 2015-02-03 ENCOUNTER — Inpatient Hospital Stay: Payer: Medicare Other | Admitting: Internal Medicine

## 2015-02-03 ENCOUNTER — Inpatient Hospital Stay: Payer: Medicare Other

## 2015-02-03 DIAGNOSIS — M19041 Primary osteoarthritis, right hand: Secondary | ICD-10-CM | POA: Diagnosis not present

## 2015-02-03 DIAGNOSIS — I69332 Monoplegia of upper limb following cerebral infarction affecting left dominant side: Secondary | ICD-10-CM | POA: Diagnosis not present

## 2015-02-03 DIAGNOSIS — E1142 Type 2 diabetes mellitus with diabetic polyneuropathy: Secondary | ICD-10-CM | POA: Diagnosis not present

## 2015-02-03 DIAGNOSIS — E1122 Type 2 diabetes mellitus with diabetic chronic kidney disease: Secondary | ICD-10-CM | POA: Diagnosis not present

## 2015-02-03 DIAGNOSIS — M069 Rheumatoid arthritis, unspecified: Secondary | ICD-10-CM | POA: Diagnosis not present

## 2015-02-03 DIAGNOSIS — M10031 Idiopathic gout, right wrist: Secondary | ICD-10-CM | POA: Diagnosis not present

## 2015-02-06 DIAGNOSIS — M10031 Idiopathic gout, right wrist: Secondary | ICD-10-CM | POA: Diagnosis not present

## 2015-02-06 DIAGNOSIS — E1122 Type 2 diabetes mellitus with diabetic chronic kidney disease: Secondary | ICD-10-CM | POA: Diagnosis not present

## 2015-02-06 DIAGNOSIS — E1142 Type 2 diabetes mellitus with diabetic polyneuropathy: Secondary | ICD-10-CM | POA: Diagnosis not present

## 2015-02-06 DIAGNOSIS — M069 Rheumatoid arthritis, unspecified: Secondary | ICD-10-CM | POA: Diagnosis not present

## 2015-02-06 DIAGNOSIS — M19041 Primary osteoarthritis, right hand: Secondary | ICD-10-CM | POA: Diagnosis not present

## 2015-02-06 DIAGNOSIS — I69332 Monoplegia of upper limb following cerebral infarction affecting left dominant side: Secondary | ICD-10-CM | POA: Diagnosis not present

## 2015-02-07 ENCOUNTER — Encounter: Payer: Self-pay | Admitting: Nurse Practitioner

## 2015-02-07 ENCOUNTER — Ambulatory Visit (INDEPENDENT_AMBULATORY_CARE_PROVIDER_SITE_OTHER): Payer: Medicare Other | Admitting: Nurse Practitioner

## 2015-02-07 VITALS — BP 140/60 | HR 64 | Temp 98.2°F | Resp 18 | Ht 70.0 in | Wt 237.6 lb

## 2015-02-07 DIAGNOSIS — I63411 Cerebral infarction due to embolism of right middle cerebral artery: Secondary | ICD-10-CM

## 2015-02-07 DIAGNOSIS — R109 Unspecified abdominal pain: Secondary | ICD-10-CM | POA: Diagnosis not present

## 2015-02-07 NOTE — Progress Notes (Signed)
Pre visit review using our clinic review tool, if applicable. No additional management support is needed unless otherwise documented below in the visit note. 

## 2015-02-07 NOTE — Assessment & Plan Note (Signed)
Improved since last visit. Patient is doing physical therapy for hand after stroke. There are also helping him with back exercises. Tylenol improved the pain. FU prn worsening/failure to improve.

## 2015-02-07 NOTE — Progress Notes (Signed)
Patient ID: [name], male    DOB: 02-Jul-1927  Age: 81 y.o. MRN: [id number]  CC: Follow-up   HPI [name] presents for follow-up on flank pain.  1) Patient reports flank pain is improved. Patient has taken Tylenol. Improved after the first day at home. Physical therapy is working with him on stretching exercises on his back. No further concerns or questions.  History [name] has a past medical history of Aortic stenosis, severe; Hypertension; Diabetes mellitus; OA (osteoarthritis); Junctional bradycardia; BPH (benign prostatic hypertrophy); Hyperlipidemia; Anemia; Gastrointestinal bleed; CAD (coronary artery disease); Mobitz type 1 second degree atrioventricular block (10/01/2012); History of bladder cancer; and Macular degeneration.   He has past surgical history that includes Cardiac catheterization (07/16/2007); Aortic valve replacement; Coronary artery bypass graft (07/27/2007); Toe amputation; Cosmetic surgery; US ECHOCARDIOGRAPHY (09/13/2009); US ECHOCARDIOGRAPHY (09/15/2007); US ECHOCARDIOGRAPHY (07/14/2007); US ECHOCARDIOGRAPHY (01/20/2007); US ECHOCARDIOGRAPHY (07/22/2006); US ECHOCARDIOGRAPHY (07/22/2005); and Cardiovascular stress test (01/17/2005).   His family history includes Diabetes in his brother; Prostate cancer in his brother; Stroke in his father.He reports that he quit smoking about 20 years ago. He does not have any smokeless tobacco history on file. He reports that he drinks about 12.6 oz of alcohol per week. He reports that he does not use illicit drugs.  Outpatient Prescriptions Prior to Visit  Medication Sig Dispense Refill  . amLODipine (NORVASC) 5 MG tablet Take 1 tablet (5 mg total) by mouth daily. 30 tablet 1  . Ascorbic Acid (VITAMIN C PO) Take by mouth.      . carvedilol (COREG) 6.25 MG tablet Take 1 tablet (6.25 mg total) by mouth 2 (two) times daily with a meal. 60 tablet 6  . cholecalciferol (VITAMIN D) 1000 UNITS tablet Take 1,000 Units by mouth daily.      [name]  ELIQUIS 2.5 MG TABS tablet TAKE ONE TABLET TWICE A DAY 60 tablet 11  . furosemide (LASIX) 40 MG tablet Take 0.5 tablets (20 mg total) by mouth daily. 30 tablet 2  . HYDROcodone-acetaminophen (NORCO) 5-325 MG per tablet Take 1 tablet by mouth every 4 (four) hours as needed for moderate pain. 10 tablet 0  . hydroxychloroquine (PLAQUENIL) 200 MG tablet Take 200 mg by mouth daily.    . insulin aspart (NOVOLOG) 100 UNIT/ML injection use three times daily before meals  Using sliding scale (Patient taking differently: use four times daily before meals  Using sliding scale  0-100  0 100-150   3 units 151-200   4 units 201 to 250  5 units 251 and over 7 units) 10 mL 11  . insulin glargine (LANTUS) 100 UNIT/ML injection Inject 0.5 mLs (50 Units total) into the skin daily. 10 mL 11  . Insulin Pen Needle 31G X 5 MM MISC Use as directed 100 each 5  . iron polysaccharides (NIFEREX) 150 MG capsule Take 1 capsule (150 mg total) by mouth 2 (two) times daily. 60 capsule 1  . losartan (COZAAR) 25 MG tablet Take 1 tablet (25 mg total) by mouth daily. 30 tablet 1  . Multiple Vitamin (MULTI-VITAMIN PO) Take by mouth.      . naproxen (NAPROSYN) 500 MG tablet Take 1 tablet (500 mg total) by mouth 2 (two) times daily with a meal. 30 tablet 0  . nitroGLYCERIN (NITRODUR - DOSED IN MG/24 HR) 0.4 mg/hr patch Place 1 patch (0.4 mg total) onto the skin daily. Apply at 6 am and remove at 6 pm daily. 30 patch 1  . pantoprazole (PROTONIX) 40 MG  tablet Take 1 tablet (40 mg total) by mouth daily. 30 tablet 5  . tamsulosin (FLOMAX) 0.4 MG CAPS capsule Take 1 capsule (0.4 mg total) by mouth daily after supper. 30 capsule 1   No facility-administered medications prior to visit.    ROS Review of Systems  Constitutional: Negative for fever, chills, diaphoresis and fatigue.  Respiratory: Positive for chest tightness.   Genitourinary: Negative for difficulty urinating.  Musculoskeletal: Positive for back pain.       Improving     Objective:  BP 140/60 mmHg  Pulse 64  Temp(Src) 98.2 F (36.8 C)  Resp 18  Ht 5\' 10"  (1.778 m)  Wt 237 lb 9.6 oz (107.775 kg)  BMI 34.09 kg/m2  SpO2 98%  Physical Exam  Constitutional: He is oriented to person, place, and time. He appears well-developed and well-nourished. No distress.  HENT:  Head: Normocephalic and atraumatic.  Right Ear: External ear normal.  Left Ear: External ear normal.  Neurological: He is alert and oriented to person, place, and time.  Using cane for ambulation  Skin: Skin is warm and dry. No rash noted. He is not diaphoretic.  Psychiatric: He has a normal mood and affect. His behavior is normal. Judgment and thought content normal.   Assessment & Plan:   [name] was seen today for follow-up.  Diagnoses and all orders for this visit:  Flank pain   I am having [name] maintain his cholecalciferol, Ascorbic Acid (VITAMIN C PO), Multiple Vitamin (MULTI-VITAMIN PO), insulin glargine, tamsulosin, losartan, iron polysaccharides, amLODipine, nitroGLYCERIN, carvedilol, Insulin Pen Needle, furosemide, insulin aspart, pantoprazole, ELIQUIS, HYDROcodone-acetaminophen, naproxen, and hydroxychloroquine.  No orders of the defined types were placed in this encounter.    Follow-up: No Follow-up on file.

## 2015-02-08 DIAGNOSIS — M19041 Primary osteoarthritis, right hand: Secondary | ICD-10-CM | POA: Diagnosis not present

## 2015-02-08 DIAGNOSIS — M069 Rheumatoid arthritis, unspecified: Secondary | ICD-10-CM | POA: Diagnosis not present

## 2015-02-08 DIAGNOSIS — E1142 Type 2 diabetes mellitus with diabetic polyneuropathy: Secondary | ICD-10-CM | POA: Diagnosis not present

## 2015-02-08 DIAGNOSIS — M10031 Idiopathic gout, right wrist: Secondary | ICD-10-CM | POA: Diagnosis not present

## 2015-02-08 DIAGNOSIS — E1122 Type 2 diabetes mellitus with diabetic chronic kidney disease: Secondary | ICD-10-CM | POA: Diagnosis not present

## 2015-02-08 DIAGNOSIS — I69332 Monoplegia of upper limb following cerebral infarction affecting left dominant side: Secondary | ICD-10-CM | POA: Diagnosis not present

## 2015-02-09 DIAGNOSIS — E1142 Type 2 diabetes mellitus with diabetic polyneuropathy: Secondary | ICD-10-CM | POA: Diagnosis not present

## 2015-02-09 DIAGNOSIS — I69332 Monoplegia of upper limb following cerebral infarction affecting left dominant side: Secondary | ICD-10-CM | POA: Diagnosis not present

## 2015-02-09 DIAGNOSIS — M069 Rheumatoid arthritis, unspecified: Secondary | ICD-10-CM | POA: Diagnosis not present

## 2015-02-09 DIAGNOSIS — E1122 Type 2 diabetes mellitus with diabetic chronic kidney disease: Secondary | ICD-10-CM | POA: Diagnosis not present

## 2015-02-09 DIAGNOSIS — M19041 Primary osteoarthritis, right hand: Secondary | ICD-10-CM | POA: Diagnosis not present

## 2015-02-09 DIAGNOSIS — M10031 Idiopathic gout, right wrist: Secondary | ICD-10-CM | POA: Diagnosis not present

## 2015-02-10 DIAGNOSIS — M10031 Idiopathic gout, right wrist: Secondary | ICD-10-CM | POA: Diagnosis not present

## 2015-02-10 DIAGNOSIS — I69332 Monoplegia of upper limb following cerebral infarction affecting left dominant side: Secondary | ICD-10-CM | POA: Diagnosis not present

## 2015-02-10 DIAGNOSIS — M069 Rheumatoid arthritis, unspecified: Secondary | ICD-10-CM | POA: Diagnosis not present

## 2015-02-10 DIAGNOSIS — E1142 Type 2 diabetes mellitus with diabetic polyneuropathy: Secondary | ICD-10-CM | POA: Diagnosis not present

## 2015-02-10 DIAGNOSIS — E1122 Type 2 diabetes mellitus with diabetic chronic kidney disease: Secondary | ICD-10-CM | POA: Diagnosis not present

## 2015-02-10 DIAGNOSIS — H5231 Anisometropia: Secondary | ICD-10-CM | POA: Diagnosis not present

## 2015-02-10 DIAGNOSIS — M19041 Primary osteoarthritis, right hand: Secondary | ICD-10-CM | POA: Diagnosis not present

## 2015-02-13 DIAGNOSIS — M10031 Idiopathic gout, right wrist: Secondary | ICD-10-CM | POA: Diagnosis not present

## 2015-02-13 DIAGNOSIS — I69332 Monoplegia of upper limb following cerebral infarction affecting left dominant side: Secondary | ICD-10-CM | POA: Diagnosis not present

## 2015-02-13 DIAGNOSIS — E1142 Type 2 diabetes mellitus with diabetic polyneuropathy: Secondary | ICD-10-CM | POA: Diagnosis not present

## 2015-02-13 DIAGNOSIS — M19041 Primary osteoarthritis, right hand: Secondary | ICD-10-CM | POA: Diagnosis not present

## 2015-02-13 DIAGNOSIS — E1122 Type 2 diabetes mellitus with diabetic chronic kidney disease: Secondary | ICD-10-CM | POA: Diagnosis not present

## 2015-02-13 DIAGNOSIS — M069 Rheumatoid arthritis, unspecified: Secondary | ICD-10-CM | POA: Diagnosis not present

## 2015-02-14 ENCOUNTER — Inpatient Hospital Stay: Payer: Medicare Other | Attending: Internal Medicine | Admitting: *Deleted

## 2015-02-14 ENCOUNTER — Inpatient Hospital Stay (HOSPITAL_BASED_OUTPATIENT_CLINIC_OR_DEPARTMENT_OTHER): Payer: Medicare Other | Admitting: Internal Medicine

## 2015-02-14 VITALS — BP 148/70 | HR 68 | Temp 98.5°F | Resp 18 | Ht 70.0 in | Wt 237.4 lb

## 2015-02-14 DIAGNOSIS — N189 Chronic kidney disease, unspecified: Principal | ICD-10-CM

## 2015-02-14 DIAGNOSIS — R5383 Other fatigue: Secondary | ICD-10-CM

## 2015-02-14 DIAGNOSIS — E119 Type 2 diabetes mellitus without complications: Secondary | ICD-10-CM

## 2015-02-14 DIAGNOSIS — D631 Anemia in chronic kidney disease: Secondary | ICD-10-CM | POA: Diagnosis not present

## 2015-02-14 DIAGNOSIS — Z87891 Personal history of nicotine dependence: Secondary | ICD-10-CM | POA: Diagnosis not present

## 2015-02-14 DIAGNOSIS — H3530 Unspecified macular degeneration: Secondary | ICD-10-CM | POA: Insufficient documentation

## 2015-02-14 DIAGNOSIS — N400 Enlarged prostate without lower urinary tract symptoms: Secondary | ICD-10-CM | POA: Diagnosis not present

## 2015-02-14 DIAGNOSIS — R001 Bradycardia, unspecified: Secondary | ICD-10-CM | POA: Insufficient documentation

## 2015-02-14 DIAGNOSIS — I69332 Monoplegia of upper limb following cerebral infarction affecting left dominant side: Secondary | ICD-10-CM | POA: Diagnosis not present

## 2015-02-14 DIAGNOSIS — E1122 Type 2 diabetes mellitus with diabetic chronic kidney disease: Secondary | ICD-10-CM | POA: Diagnosis not present

## 2015-02-14 DIAGNOSIS — I519 Heart disease, unspecified: Secondary | ICD-10-CM | POA: Diagnosis not present

## 2015-02-14 DIAGNOSIS — R531 Weakness: Secondary | ICD-10-CM

## 2015-02-14 DIAGNOSIS — M1990 Unspecified osteoarthritis, unspecified site: Secondary | ICD-10-CM | POA: Diagnosis not present

## 2015-02-14 DIAGNOSIS — I10 Essential (primary) hypertension: Secondary | ICD-10-CM | POA: Insufficient documentation

## 2015-02-14 DIAGNOSIS — I272 Other secondary pulmonary hypertension: Secondary | ICD-10-CM | POA: Diagnosis not present

## 2015-02-14 DIAGNOSIS — I2510 Atherosclerotic heart disease of native coronary artery without angina pectoris: Secondary | ICD-10-CM | POA: Diagnosis not present

## 2015-02-14 DIAGNOSIS — Z8673 Personal history of transient ischemic attack (TIA), and cerebral infarction without residual deficits: Secondary | ICD-10-CM

## 2015-02-14 DIAGNOSIS — E1142 Type 2 diabetes mellitus with diabetic polyneuropathy: Secondary | ICD-10-CM | POA: Diagnosis not present

## 2015-02-14 DIAGNOSIS — I350 Nonrheumatic aortic (valve) stenosis: Secondary | ICD-10-CM | POA: Diagnosis not present

## 2015-02-14 DIAGNOSIS — Z79899 Other long term (current) drug therapy: Secondary | ICD-10-CM

## 2015-02-14 DIAGNOSIS — D509 Iron deficiency anemia, unspecified: Secondary | ICD-10-CM | POA: Diagnosis not present

## 2015-02-14 DIAGNOSIS — Z8551 Personal history of malignant neoplasm of bladder: Secondary | ICD-10-CM | POA: Diagnosis not present

## 2015-02-14 DIAGNOSIS — E785 Hyperlipidemia, unspecified: Secondary | ICD-10-CM | POA: Diagnosis not present

## 2015-02-14 DIAGNOSIS — M19041 Primary osteoarthritis, right hand: Secondary | ICD-10-CM | POA: Diagnosis not present

## 2015-02-14 DIAGNOSIS — R0602 Shortness of breath: Secondary | ICD-10-CM | POA: Diagnosis not present

## 2015-02-14 DIAGNOSIS — M069 Rheumatoid arthritis, unspecified: Secondary | ICD-10-CM | POA: Diagnosis not present

## 2015-02-14 DIAGNOSIS — D649 Anemia, unspecified: Secondary | ICD-10-CM

## 2015-02-14 DIAGNOSIS — Z794 Long term (current) use of insulin: Secondary | ICD-10-CM

## 2015-02-14 DIAGNOSIS — M10031 Idiopathic gout, right wrist: Secondary | ICD-10-CM | POA: Diagnosis not present

## 2015-02-14 LAB — CBC WITH DIFFERENTIAL/PLATELET
BASOS PCT: 1 %
Basophils Absolute: 0.1 10*3/uL (ref 0–0.1)
EOS ABS: 0.2 10*3/uL (ref 0–0.7)
EOS PCT: 4 %
HCT: 27.6 % — ABNORMAL LOW (ref 40.0–52.0)
Hemoglobin: 8.9 g/dL — ABNORMAL LOW (ref 13.0–18.0)
LYMPHS ABS: 0.6 10*3/uL — AB (ref 1.0–3.6)
Lymphocytes Relative: 10 %
MCH: 26.9 pg (ref 26.0–34.0)
MCHC: 32.4 g/dL (ref 32.0–36.0)
MCV: 83.2 fL (ref 80.0–100.0)
Monocytes Absolute: 0.6 10*3/uL (ref 0.2–1.0)
Monocytes Relative: 10 %
Neutro Abs: 5.1 10*3/uL (ref 1.4–6.5)
Neutrophils Relative %: 75 %
PLATELETS: 227 10*3/uL (ref 150–440)
RBC: 3.32 MIL/uL — AB (ref 4.40–5.90)
RDW: 15.4 % — ABNORMAL HIGH (ref 11.5–14.5)
WBC: 6.7 10*3/uL (ref 3.8–10.6)

## 2015-02-14 LAB — TYPE AND SCREEN
ABO/RH(D): O POS
ANTIBODY SCREEN: NEGATIVE

## 2015-02-15 ENCOUNTER — Telehealth: Payer: Self-pay

## 2015-02-15 DIAGNOSIS — I69332 Monoplegia of upper limb following cerebral infarction affecting left dominant side: Secondary | ICD-10-CM | POA: Diagnosis not present

## 2015-02-15 DIAGNOSIS — M10031 Idiopathic gout, right wrist: Secondary | ICD-10-CM | POA: Diagnosis not present

## 2015-02-15 DIAGNOSIS — M069 Rheumatoid arthritis, unspecified: Secondary | ICD-10-CM | POA: Diagnosis not present

## 2015-02-15 DIAGNOSIS — E1142 Type 2 diabetes mellitus with diabetic polyneuropathy: Secondary | ICD-10-CM | POA: Diagnosis not present

## 2015-02-15 DIAGNOSIS — M19041 Primary osteoarthritis, right hand: Secondary | ICD-10-CM | POA: Diagnosis not present

## 2015-02-15 DIAGNOSIS — E1122 Type 2 diabetes mellitus with diabetic chronic kidney disease: Secondary | ICD-10-CM | POA: Diagnosis not present

## 2015-02-15 NOTE — Telephone Encounter (Signed)
Spoke w/ pt.  He reports that he has no energy, Hgb of 8. States that Dr. Ma Hillock will not give him Procrit b/c of his afib.  He is requesting permission to proceed w/ receiving Procrit.  States that Dr. Acie Fredrickson told him that he should be fine, but he needs [place] to send clearance to Dr. Ma Hillock.

## 2015-02-16 NOTE — Telephone Encounter (Signed)
What do you think about epo for mr. Mould/ thx Esmond Plants

## 2015-02-17 DIAGNOSIS — M19041 Primary osteoarthritis, right hand: Secondary | ICD-10-CM | POA: Diagnosis not present

## 2015-02-17 DIAGNOSIS — I69332 Monoplegia of upper limb following cerebral infarction affecting left dominant side: Secondary | ICD-10-CM | POA: Diagnosis not present

## 2015-02-17 DIAGNOSIS — E1122 Type 2 diabetes mellitus with diabetic chronic kidney disease: Secondary | ICD-10-CM | POA: Diagnosis not present

## 2015-02-17 DIAGNOSIS — E1142 Type 2 diabetes mellitus with diabetic polyneuropathy: Secondary | ICD-10-CM | POA: Diagnosis not present

## 2015-02-17 DIAGNOSIS — M10031 Idiopathic gout, right wrist: Secondary | ICD-10-CM | POA: Diagnosis not present

## 2015-02-17 DIAGNOSIS — M069 Rheumatoid arthritis, unspecified: Secondary | ICD-10-CM | POA: Diagnosis not present

## 2015-02-17 NOTE — Telephone Encounter (Signed)
Would call [name] and explain the reason for holding the EPO as detailed by Dr. Ma Hillock. He is currently not on a blood thinner, is in chronic atrial fibrillation with history of stroke. EPO raises his risk of stroke by causing clots. There is a significant risk. Certainly EPO could be given if he wants the shot and is willing to accept a risk of stroke. Risk of stroke is high though  Would suggest he perhaps talk about this with his son I can also talk with Dr. Acie Fredrickson, Junior

## 2015-02-17 NOTE — Telephone Encounter (Signed)
Given his h/o CVA and A.fib which has higher risk of thromboembolism, we are avoiding Procrit to avoid any additional risk of thromboembolic phenomena. If Cardiology feels that the additional risk of Procrit is minimal in this situation, and patient/family understands and is willing to take the risk, we can monitor the Hb qweekly and give low dose Procrit (start off with 10000 units per week) and monitor for response, and dose-escalate slowly if no response. Thanks.

## 2015-02-20 ENCOUNTER — Ambulatory Visit (INDEPENDENT_AMBULATORY_CARE_PROVIDER_SITE_OTHER): Payer: Medicare Other | Admitting: Internal Medicine

## 2015-02-20 ENCOUNTER — Encounter: Payer: Self-pay | Admitting: Internal Medicine

## 2015-02-20 VITALS — BP 140/62 | HR 65 | Temp 98.2°F | Resp 12 | Ht 70.0 in | Wt 237.2 lb

## 2015-02-20 DIAGNOSIS — D631 Anemia in chronic kidney disease: Secondary | ICD-10-CM

## 2015-02-20 DIAGNOSIS — E1122 Type 2 diabetes mellitus with diabetic chronic kidney disease: Secondary | ICD-10-CM | POA: Diagnosis not present

## 2015-02-20 DIAGNOSIS — I69332 Monoplegia of upper limb following cerebral infarction affecting left dominant side: Secondary | ICD-10-CM | POA: Diagnosis not present

## 2015-02-20 DIAGNOSIS — I63411 Cerebral infarction due to embolism of right middle cerebral artery: Secondary | ICD-10-CM

## 2015-02-20 DIAGNOSIS — N189 Chronic kidney disease, unspecified: Secondary | ICD-10-CM | POA: Diagnosis not present

## 2015-02-20 DIAGNOSIS — M069 Rheumatoid arthritis, unspecified: Secondary | ICD-10-CM | POA: Diagnosis not present

## 2015-02-20 DIAGNOSIS — N183 Chronic kidney disease, stage 3 unspecified: Secondary | ICD-10-CM

## 2015-02-20 DIAGNOSIS — M19041 Primary osteoarthritis, right hand: Secondary | ICD-10-CM | POA: Diagnosis not present

## 2015-02-20 DIAGNOSIS — I63311 Cerebral infarction due to thrombosis of right middle cerebral artery: Secondary | ICD-10-CM

## 2015-02-20 DIAGNOSIS — E1142 Type 2 diabetes mellitus with diabetic polyneuropathy: Secondary | ICD-10-CM | POA: Diagnosis not present

## 2015-02-20 DIAGNOSIS — Z23 Encounter for immunization: Secondary | ICD-10-CM

## 2015-02-20 DIAGNOSIS — E1121 Type 2 diabetes mellitus with diabetic nephropathy: Secondary | ICD-10-CM

## 2015-02-20 DIAGNOSIS — M10031 Idiopathic gout, right wrist: Secondary | ICD-10-CM | POA: Diagnosis not present

## 2015-02-20 NOTE — Telephone Encounter (Signed)
Spoke w/ pt.  Advised him of Dr. Donivan Scull recommendation.  He verbalizes understanding, but is frustrated, as he is "weak as a kitten". He will discuss his options w/ Dr. Ma Hillock and call back if we can be of further assistance.

## 2015-02-20 NOTE — Progress Notes (Signed)
Subjective:  Patient ID: [name], male    DOB: 10-21-1927  Age: 81 y.o. MRN: [id number]  CC: The primary encounter diagnosis was Anemia in chronic kidney disease. 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  HPI [name] presents for follow up on type 2 DM,  Chronic anemia secondary to CKD, aortic stenosis, atrial fibrillation with  prior CVA,  Hypertension, and weight loss .  Patient is very frustrated .  He reports persistent fatigue with exertion despite PT and attributes his symptoms to his anemia.  His hematologist and his cardiologist, Dr Ma Hillock and Dr Rockey Situ, both have deferred treatment with Procrit due to increased risk of CVA,  And he has been denied transfusion because his hgb is 8.1 .  He has not seen nephrology for management of CKD but today agrees to see another specialist if there is a chance to improve his anemia. He is taking Eliquis twice daily since his stroke.   2) DM Type 2:  He continues to managed his  BS with novolog sliding scale 4 times daily and Lantus 50 units. .Last night had a low of 70 due to skipping his bedtime snack   3) History of CVA:  Left sided weakness is improving with continued PT/OT three times weekly.  Lives alone in  A 2 story home .  Has a home visit by  RN every other day  As well.  No recent falls,  But has not purchased LifeAlert yet despite repeated encouragement from me and his son.  Dr. Acie Fredrickson.   Outpatient Prescriptions Prior to Visit  Medication Sig Dispense Refill  . Ascorbic Acid (VITAMIN C PO) Take by mouth.      . carvedilol (COREG) 6.25 MG tablet Take 1 tablet (6.25 mg total) by mouth 2 (two) times daily with a meal. 60 tablet  6  . cholecalciferol (VITAMIN D) 1000 UNITS tablet Take 1,000 Units by mouth daily.      [name] ELIQUIS 2.5 MG TABS tablet TAKE ONE TABLET TWICE A DAY 60 tablet 11  . furosemide (LASIX) 40 MG tablet Take 0.5 tablets (20 mg total) by mouth daily. 30 tablet 2  . hydroxychloroquine (PLAQUENIL) 200 MG tablet Take 200 mg by mouth daily.    . insulin aspart (NOVOLOG) 100 UNIT/ML injection use three times daily before meals  Using sliding scale (Patient taking differently: use four times daily before meals  Using sliding scale  0-100  0 100-150   3 units 151-200   4 units 201 to 250  5 units 251 and over 7 units) 10 mL 11  . insulin glargine (LANTUS) 100 UNIT/ML injection Inject 0.5 mLs (50 Units total) into the skin daily. 10 mL 11  . Insulin Pen Needle 31G X 5 MM MISC Use as directed 100 each 5  . iron polysaccharides (NIFEREX) 150 MG capsule Take 1 capsule (150 mg total) by mouth 2 (two) times daily. 60 capsule 1  . Multiple Vitamin (MULTI-VITAMIN PO) Take by mouth.      . nitroGLYCERIN (NITRODUR - DOSED IN MG/24 HR) 0.4 mg/hr patch Place 1 patch (0.4 mg total) onto the skin daily. Apply at 6 am and remove at 6 pm daily. 30 patch 1  . pantoprazole (PROTONIX) 40 MG  tablet Take 1 tablet (40 mg total) by mouth daily. 30 tablet 5  . tamsulosin (FLOMAX) 0.4 MG CAPS capsule Take 1 capsule (0.4 mg total) by mouth daily after supper. 30 capsule 1  . HYDROcodone-acetaminophen (NORCO) 5-325 MG per tablet Take 1 tablet by mouth every 4 (four) hours as needed for moderate pain. (Patient not taking: Reported on 02/20/2015) 10 tablet 0  . losartan (COZAAR) 25 MG tablet Take 1 tablet (25 mg total) by mouth daily. 30 tablet 1  . amLODipine (NORVASC) 5 MG tablet Take 1 tablet (5 mg total) by mouth daily. (Patient not taking: Reported on 02/20/2015) 30 tablet 1  . naproxen (NAPROSYN) 500 MG tablet Take 1 tablet (500 mg total) by mouth 2 (two) times daily with a meal. (Patient not taking: Reported on 02/20/2015) 30 tablet 0     No facility-administered medications prior to visit.    Review of Systems;  Patient denies headache, fevers, malaise, unintentional weight loss, skin rash, eye pain, sinus congestion and sinus pain, sore throat, dysphagia,  hemoptysis , cough, dyspnea, wheezing, chest pain, palpitations, orthopnea, edema, abdominal pain, nausea, melena, diarrhea, constipation, flank pain, dysuria, hematuria, urinary  Frequency, nocturia, numbness, tingling, seizures,  Focal weakness, Loss of consciousness,  Tremor, insomnia, depression, anxiety, and suicidal ideation.      Objective:  BP 140/62 mmHg  Pulse 65  Temp(Src) 98.2 F (36.8 C) (Oral)  Resp 12  Ht 5\' 10"  (1.778 m)  Wt 237 lb 4 oz (107.616 kg)  BMI 34.04 kg/m2  SpO2 98%  BP Readings from Last 3 Encounters:  02/20/15 140/62  02/14/15 148/70  02/07/15 140/60    Wt Readings from Last 3 Encounters:  02/20/15 237 lb 4 oz (107.616 kg)  02/14/15 237 lb 7 oz (107.7 kg)  02/07/15 237 lb 9.6 oz (107.775 kg)    General appearance: alert, cooperative and appears stated age Ears: normal TM's and external ear canals both ears Throat: lips, mucosa, and tongue normal; teeth and gums normal Neck: no adenopathy, no carotid bruit, supple, symmetrical, trachea midline and thyroid not enlarged, symmetric, no tenderness/mass/nodules Back: symmetric, no curvature. ROM normal. No CVA tenderness. Lungs: clear to auscultation bilaterally Heart: regular rate and rhythm, S1, S2 normal, no murmur, click, rub or gallop Abdomen: soft, non-tender; bowel sounds normal; no masses,  no organomegaly Pulses: 2+ and symmetric Skin: Skin color, texture, turgor normal. No rashes or lesions Lymph nodes: Cervical, supraclavicular, and axillary nodes normal.  Lab Results  Component Value Date   HGBA1C 6.5 01/18/2015   HGBA1C 6.2 10/12/2014   HGBA1C 7.8* 07/12/2014    Lab Results  Component Value Date   CREATININE 1.93* 01/31/2015   CREATININE 2.11* 01/18/2015    CREATININE 1.92* 10/12/2014    Lab Results  Component Value Date   WBC 6.7 02/14/2015   HGB 8.9* 02/14/2015   HCT 27.6* 02/14/2015   PLT 227 02/14/2015   GLUCOSE 263* 01/31/2015   CHOL 129 01/18/2015   TRIG 58.0 01/18/2015   HDL 45.00 01/18/2015   LDLDIRECT 71.0 01/18/2015   LDLCALC 72 01/18/2015   ALT 13 01/31/2015   AST 19 01/31/2015   NA 134* 01/31/2015   K 5.0 01/31/2015   CL 103 01/31/2015   CREATININE 1.93* 01/31/2015   BUN 37* 01/31/2015   CO2 23 01/31/2015   TSH 3.53 09/07/2014   PSA 0.48 09/07/2014   INR 1.0 06/27/2014   HGBA1C 6.5 01/18/2015   MICROALBUR 33.5* 10/12/2014    Dg Hand Complete Right  12/26/2014   CLINICAL DATA:  Severe right hand pain.  No trauma.  EXAM: RIGHT HAND - COMPLETE 3+ VIEW  COMPARISON:  None.  FINDINGS: There is no evidence of fracture or dislocation. There are decreased joint space with associated erosion involving the proximal and distal interphalangeal joints of the second through fourth digits. There are erosive changes of the distal first metacarpal. There is periarticular soft tissue swelling of the second through fourth digits.  IMPRESSION: No acute fracture or dislocation. Arthritic changes of the right hand as described. Differential diagnosis includes rheumatoid arthritis.   Electronically Signed   By: Abelardo Diesel M.D.   On: 12/26/2014 17:20    Assessment & Plan:   Problem List Items Addressed This Visit      Unprioritized   Anemia in chronic kidney disease - Primary    Managed by  Dr. Ma Hillock at [place] [facility] who gave IV Iron Venofer 500 MG x 1 on March 11 for HGB 9.0 and LOW IRON STORES.  Dr Pandit's last note indicates that Procrit and other alternatives are C/I secondary to recent CVA January 2016,  But atrial fibrillation was considered the cause of the embolic stroke,  And he is taking Eliquis twice daily.  I agree with Dr Acie Fredrickson that the benefits of Procrit outweigh the risks, and the improvement in chronic fatigue  would improve his quality of life. Second opinion offered ,  Referral to Nephrology in process.        Relevant Orders   Ambulatory referral to Nephrology   CVA (cerebral infarction)    Cortical/subcortical infarct in R frontal lobe R MCA distribution by MRI Jan 2016 after a 2 month suspension of Eliquis for development of heme positive stools and anemia. His LUE weakness is improving with PT and eliquis has been resumed.         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  Lab Results  Component Value Date   HGBA1C 6.5 01/18/2015   Lab Results  Component Value Date   MICROALBUR 33.5* 10/12/2014   Lab Results  Component Value Date   CREATININE 1.93* 01/31/2015           Relevant Orders   Ambulatory referral to Nephrology   CKD (chronic kidney disease) stage 3, GFR 30-59 ml/min    Referral to [facility] for management of CKD and anemia.  He is avoiding NSAIDs and other nephrotoxic agents, and tolerating losartan         Relevant Orders   Ambulatory referral to Nephrology    Other Visit Diagnoses    Need for prophylactic vaccination against Streptococcus pneumoniae (pneumococcus)        Relevant Orders    Pneumococcal conjugate vaccine 13-valent (Completed)    Encounter for immunization           I have discontinued Mr. Weekes's amLODipine and naproxen. I am also having him maintain his cholecalciferol, Ascorbic Acid (VITAMIN C PO), Multiple Vitamin (MULTI-VITAMIN PO), insulin glargine, tamsulosin, losartan, iron polysaccharides, nitroGLYCERIN, carvedilol, Insulin Pen Needle, furosemide, insulin aspart, pantoprazole, ELIQUIS, HYDROcodone-acetaminophen, hydroxychloroquine, and Multiple Vitamins-Minerals (PRESERVISION AREDS PO).  Meds ordered this encounter  Medications  .  Multiple Vitamins-Minerals (PRESERVISION AREDS PO)    Sig: Take 1 capsule by mouth daily.    Medications Discontinued During  This Encounter  Medication Reason  . amLODipine (NORVASC) 5 MG tablet Patient Preference  . naproxen (NAPROSYN) 500 MG tablet   . amLODipine (NORVASC) 5 MG tablet Patient Preference    Follow-up: Return in about 3 months (around 05/22/2015) for follow up diabetes.   [name], MD

## 2015-02-20 NOTE — Patient Instructions (Signed)
Referral to [facility] is in process t  PLEASE go get a [employer] medical alert amulet   Make sure you have a snack every night to prevent low blood sugars   You recieved the influenza and the Prevnar vaccines today

## 2015-02-20 NOTE — Progress Notes (Signed)
Pre-visit discussion using our clinic review tool. No additional management support is needed unless otherwise documented below in the visit note.  

## 2015-02-21 ENCOUNTER — Encounter: Payer: Self-pay | Admitting: Internal Medicine

## 2015-02-21 DIAGNOSIS — M10031 Idiopathic gout, right wrist: Secondary | ICD-10-CM | POA: Diagnosis not present

## 2015-02-21 DIAGNOSIS — I69332 Monoplegia of upper limb following cerebral infarction affecting left dominant side: Secondary | ICD-10-CM | POA: Diagnosis not present

## 2015-02-21 DIAGNOSIS — M069 Rheumatoid arthritis, unspecified: Secondary | ICD-10-CM | POA: Diagnosis not present

## 2015-02-21 DIAGNOSIS — E1122 Type 2 diabetes mellitus with diabetic chronic kidney disease: Secondary | ICD-10-CM | POA: Diagnosis not present

## 2015-02-21 DIAGNOSIS — M19041 Primary osteoarthritis, right hand: Secondary | ICD-10-CM | POA: Diagnosis not present

## 2015-02-21 DIAGNOSIS — E1142 Type 2 diabetes mellitus with diabetic polyneuropathy: Secondary | ICD-10-CM | POA: Diagnosis not present

## 2015-02-21 NOTE — Assessment & Plan Note (Signed)
Managed by  Dr. Ma Hillock at [place] [facility] who gave IV Iron Venofer 500 MG x 1 on March 11 for HGB 9.0 and LOW IRON STORES.  Dr Pandit's last note indicates that Procrit and other alternatives are C/I secondary to recent CVA January 2016,  But atrial fibrillation was considered the cause of the embolic stroke,  And he is taking Eliquis twice daily.  I agree with Dr Acie Fredrickson that the benefits of Procrit outweigh the risks, and the improvement in chronic fatigue would improve his quality of life. Second opinion offered ,  Referral to Nephrology in process.

## 2015-02-21 NOTE — Assessment & Plan Note (Signed)
His hypoglycemic events have decreased in frequency with reduction in his sliding scale insulin at dinner to 3 units and  Recommended a bedtime snack every night   .  Lab Results  Component Value Date   HGBA1C 6.5 01/18/2015   Lab Results  Component Value Date   MICROALBUR 33.5* 10/12/2014   Lab Results  Component Value Date   CREATININE 1.93* 01/31/2015

## 2015-02-21 NOTE — Assessment & Plan Note (Signed)
Cortical/subcortical infarct in R frontal lobe R MCA distribution by MRI Jan 2016 after a 2 month suspension of Eliquis for development of heme positive stools and anemia. His LUE weakness is improving with PT and eliquis has been resumed.  

## 2015-02-21 NOTE — Assessment & Plan Note (Signed)
Referral to [facility] for management of CKD and anemia.  He is avoiding NSAIDs and other nephrotoxic agents, and tolerating losartan

## 2015-02-22 DIAGNOSIS — M19041 Primary osteoarthritis, right hand: Secondary | ICD-10-CM | POA: Diagnosis not present

## 2015-02-22 DIAGNOSIS — M069 Rheumatoid arthritis, unspecified: Secondary | ICD-10-CM | POA: Diagnosis not present

## 2015-02-22 DIAGNOSIS — I69332 Monoplegia of upper limb following cerebral infarction affecting left dominant side: Secondary | ICD-10-CM | POA: Diagnosis not present

## 2015-02-22 DIAGNOSIS — E1142 Type 2 diabetes mellitus with diabetic polyneuropathy: Secondary | ICD-10-CM | POA: Diagnosis not present

## 2015-02-22 DIAGNOSIS — E1122 Type 2 diabetes mellitus with diabetic chronic kidney disease: Secondary | ICD-10-CM | POA: Diagnosis not present

## 2015-02-22 DIAGNOSIS — M10031 Idiopathic gout, right wrist: Secondary | ICD-10-CM | POA: Diagnosis not present

## 2015-02-23 DIAGNOSIS — E1142 Type 2 diabetes mellitus with diabetic polyneuropathy: Secondary | ICD-10-CM | POA: Diagnosis not present

## 2015-02-23 DIAGNOSIS — E1140 Type 2 diabetes mellitus with diabetic neuropathy, unspecified: Secondary | ICD-10-CM | POA: Diagnosis not present

## 2015-02-23 DIAGNOSIS — M10031 Idiopathic gout, right wrist: Secondary | ICD-10-CM | POA: Diagnosis not present

## 2015-02-23 DIAGNOSIS — I69332 Monoplegia of upper limb following cerebral infarction affecting left dominant side: Secondary | ICD-10-CM | POA: Diagnosis not present

## 2015-02-23 DIAGNOSIS — B351 Tinea unguium: Secondary | ICD-10-CM | POA: Diagnosis not present

## 2015-02-23 DIAGNOSIS — Z794 Long term (current) use of insulin: Secondary | ICD-10-CM | POA: Diagnosis not present

## 2015-02-23 DIAGNOSIS — M069 Rheumatoid arthritis, unspecified: Secondary | ICD-10-CM | POA: Diagnosis not present

## 2015-02-23 DIAGNOSIS — E1122 Type 2 diabetes mellitus with diabetic chronic kidney disease: Secondary | ICD-10-CM | POA: Diagnosis not present

## 2015-02-23 DIAGNOSIS — M19041 Primary osteoarthritis, right hand: Secondary | ICD-10-CM | POA: Diagnosis not present

## 2015-02-24 DIAGNOSIS — I69332 Monoplegia of upper limb following cerebral infarction affecting left dominant side: Secondary | ICD-10-CM | POA: Diagnosis not present

## 2015-02-24 DIAGNOSIS — E1122 Type 2 diabetes mellitus with diabetic chronic kidney disease: Secondary | ICD-10-CM | POA: Diagnosis not present

## 2015-02-24 DIAGNOSIS — M19041 Primary osteoarthritis, right hand: Secondary | ICD-10-CM | POA: Diagnosis not present

## 2015-02-24 DIAGNOSIS — M069 Rheumatoid arthritis, unspecified: Secondary | ICD-10-CM | POA: Diagnosis not present

## 2015-02-24 DIAGNOSIS — E1142 Type 2 diabetes mellitus with diabetic polyneuropathy: Secondary | ICD-10-CM | POA: Diagnosis not present

## 2015-02-24 DIAGNOSIS — M10031 Idiopathic gout, right wrist: Secondary | ICD-10-CM | POA: Diagnosis not present

## 2015-02-27 DIAGNOSIS — E1142 Type 2 diabetes mellitus with diabetic polyneuropathy: Secondary | ICD-10-CM | POA: Diagnosis not present

## 2015-02-27 DIAGNOSIS — M069 Rheumatoid arthritis, unspecified: Secondary | ICD-10-CM | POA: Diagnosis not present

## 2015-02-27 DIAGNOSIS — E1122 Type 2 diabetes mellitus with diabetic chronic kidney disease: Secondary | ICD-10-CM | POA: Diagnosis not present

## 2015-02-27 DIAGNOSIS — I69332 Monoplegia of upper limb following cerebral infarction affecting left dominant side: Secondary | ICD-10-CM | POA: Diagnosis not present

## 2015-02-27 DIAGNOSIS — M10031 Idiopathic gout, right wrist: Secondary | ICD-10-CM | POA: Diagnosis not present

## 2015-02-27 DIAGNOSIS — M19041 Primary osteoarthritis, right hand: Secondary | ICD-10-CM | POA: Diagnosis not present

## 2015-02-28 DIAGNOSIS — M069 Rheumatoid arthritis, unspecified: Secondary | ICD-10-CM | POA: Diagnosis not present

## 2015-02-28 DIAGNOSIS — E1122 Type 2 diabetes mellitus with diabetic chronic kidney disease: Secondary | ICD-10-CM | POA: Diagnosis not present

## 2015-02-28 DIAGNOSIS — M10031 Idiopathic gout, right wrist: Secondary | ICD-10-CM | POA: Diagnosis not present

## 2015-02-28 DIAGNOSIS — I69332 Monoplegia of upper limb following cerebral infarction affecting left dominant side: Secondary | ICD-10-CM | POA: Diagnosis not present

## 2015-02-28 DIAGNOSIS — E1142 Type 2 diabetes mellitus with diabetic polyneuropathy: Secondary | ICD-10-CM | POA: Diagnosis not present

## 2015-02-28 DIAGNOSIS — M19041 Primary osteoarthritis, right hand: Secondary | ICD-10-CM | POA: Diagnosis not present
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
## 2015-03-02 DIAGNOSIS — E1122 Type 2 diabetes mellitus with diabetic chronic kidney disease: Secondary | ICD-10-CM | POA: Diagnosis not present

## 2015-03-02 DIAGNOSIS — E1142 Type 2 diabetes mellitus with diabetic polyneuropathy: Secondary | ICD-10-CM | POA: Diagnosis not present

## 2015-03-02 DIAGNOSIS — I69332 Monoplegia of upper limb following cerebral infarction affecting left dominant side: Secondary | ICD-10-CM | POA: Diagnosis not present

## 2015-03-02 DIAGNOSIS — M069 Rheumatoid arthritis, unspecified: Secondary | ICD-10-CM | POA: Diagnosis not present

## 2015-03-02 DIAGNOSIS — M19041 Primary osteoarthritis, right hand: Secondary | ICD-10-CM | POA: Diagnosis not present

## 2015-03-02 DIAGNOSIS — M10031 Idiopathic gout, right wrist: Secondary | ICD-10-CM | POA: Diagnosis not present

## 2015-03-03 ENCOUNTER — Other Ambulatory Visit: Payer: Self-pay | Admitting: *Deleted

## 2015-03-03 DIAGNOSIS — M10031 Idiopathic gout, right wrist: Secondary | ICD-10-CM | POA: Diagnosis not present

## 2015-03-03 DIAGNOSIS — E1122 Type 2 diabetes mellitus with diabetic chronic kidney disease: Secondary | ICD-10-CM | POA: Diagnosis not present

## 2015-03-03 DIAGNOSIS — M069 Rheumatoid arthritis, unspecified: Secondary | ICD-10-CM | POA: Diagnosis not present

## 2015-03-03 DIAGNOSIS — I69332 Monoplegia of upper limb following cerebral infarction affecting left dominant side: Secondary | ICD-10-CM | POA: Diagnosis not present

## 2015-03-03 DIAGNOSIS — E1142 Type 2 diabetes mellitus with diabetic polyneuropathy: Secondary | ICD-10-CM | POA: Diagnosis not present

## 2015-03-03 DIAGNOSIS — M19041 Primary osteoarthritis, right hand: Secondary | ICD-10-CM | POA: Diagnosis not present

## 2015-03-03 MED ORDER — CARVEDILOL 6.25 MG PO TABS: 6.2500 mg | ORAL_TABLET | Freq: Two times a day (BID) | ORAL | Status: DC

## 2015-03-06 DIAGNOSIS — E1122 Type 2 diabetes mellitus with diabetic chronic kidney disease: Secondary | ICD-10-CM | POA: Diagnosis not present

## 2015-03-06 DIAGNOSIS — R809 Proteinuria, unspecified: Secondary | ICD-10-CM | POA: Diagnosis not present

## 2015-03-06 DIAGNOSIS — I10 Essential (primary) hypertension: Secondary | ICD-10-CM | POA: Diagnosis not present

## 2015-03-06 DIAGNOSIS — D631 Anemia in chronic kidney disease: Secondary | ICD-10-CM | POA: Diagnosis not present

## 2015-03-06 DIAGNOSIS — N183 Chronic kidney disease, stage 3 (moderate): Secondary | ICD-10-CM | POA: Diagnosis not present

## 2015-03-06 LAB — BASIC METABOLIC PANEL
BUN: 41 mg/dL — AB (ref 4–21)
Creatinine: 1.9 mg/dL — AB (ref 0.6–1.3)
Glucose: 124 mg/dL
Potassium: 5.3 mmol/L (ref 3.4–5.3)
Sodium: 140 mmol/L (ref 137–147)

## 2015-03-06 LAB — CBC AND DIFFERENTIAL
HCT: 28 % — AB (ref 41–53)
Hemoglobin: 8.8 g/dL — AB (ref 13.5–17.5)
NEUTROS ABS: 4 /uL
PLATELETS: 291 10*3/uL (ref 150–399)
WBC: 6.2 10^3/mL

## 2015-03-06 LAB — HEMOGLOBIN A1C: HEMOGLOBIN A1C: 7.1 % — AB (ref 4.0–6.0)

## 2015-03-07 ENCOUNTER — Inpatient Hospital Stay: Payer: Medicare Other

## 2015-03-07 DIAGNOSIS — E1122 Type 2 diabetes mellitus with diabetic chronic kidney disease: Secondary | ICD-10-CM | POA: Diagnosis not present

## 2015-03-07 DIAGNOSIS — I69332 Monoplegia of upper limb following cerebral infarction affecting left dominant side: Secondary | ICD-10-CM | POA: Diagnosis not present

## 2015-03-07 DIAGNOSIS — M0579 Rheumatoid arthritis with rheumatoid factor of multiple sites without organ or systems involvement: Secondary | ICD-10-CM | POA: Diagnosis not present

## 2015-03-07 DIAGNOSIS — M069 Rheumatoid arthritis, unspecified: Secondary | ICD-10-CM | POA: Diagnosis not present

## 2015-03-07 DIAGNOSIS — M10031 Idiopathic gout, right wrist: Secondary | ICD-10-CM | POA: Diagnosis not present

## 2015-03-07 DIAGNOSIS — M79641 Pain in right hand: Secondary | ICD-10-CM | POA: Diagnosis not present

## 2015-03-07 DIAGNOSIS — N183 Chronic kidney disease, stage 3 (moderate): Secondary | ICD-10-CM | POA: Diagnosis not present

## 2015-03-07 DIAGNOSIS — E1142 Type 2 diabetes mellitus with diabetic polyneuropathy: Secondary | ICD-10-CM | POA: Diagnosis not present

## 2015-03-07 DIAGNOSIS — M19041 Primary osteoarthritis, right hand: Secondary | ICD-10-CM | POA: Diagnosis not present

## 2015-03-08 ENCOUNTER — Other Ambulatory Visit: Payer: Self-pay | Admitting: Nephrology

## 2015-03-08 DIAGNOSIS — N183 Chronic kidney disease, stage 3 unspecified: Secondary | ICD-10-CM

## 2015-03-08 DIAGNOSIS — M069 Rheumatoid arthritis, unspecified: Secondary | ICD-10-CM | POA: Diagnosis not present

## 2015-03-08 DIAGNOSIS — I69332 Monoplegia of upper limb following cerebral infarction affecting left dominant side: Secondary | ICD-10-CM | POA: Diagnosis not present

## 2015-03-08 DIAGNOSIS — E1142 Type 2 diabetes mellitus with diabetic polyneuropathy: Secondary | ICD-10-CM | POA: Diagnosis not present

## 2015-03-08 DIAGNOSIS — M10031 Idiopathic gout, right wrist: Secondary | ICD-10-CM | POA: Diagnosis not present

## 2015-03-08 DIAGNOSIS — E1122 Type 2 diabetes mellitus with diabetic chronic kidney disease: Secondary | ICD-10-CM | POA: Diagnosis not present

## 2015-03-08 DIAGNOSIS — M19041 Primary osteoarthritis, right hand: Secondary | ICD-10-CM | POA: Diagnosis not present

## 2015-03-09 DIAGNOSIS — E1122 Type 2 diabetes mellitus with diabetic chronic kidney disease: Secondary | ICD-10-CM | POA: Diagnosis not present

## 2015-03-09 DIAGNOSIS — M19041 Primary osteoarthritis, right hand: Secondary | ICD-10-CM | POA: Diagnosis not present

## 2015-03-09 DIAGNOSIS — I69332 Monoplegia of upper limb following cerebral infarction affecting left dominant side: Secondary | ICD-10-CM | POA: Diagnosis not present

## 2015-03-09 DIAGNOSIS — E1142 Type 2 diabetes mellitus with diabetic polyneuropathy: Secondary | ICD-10-CM | POA: Diagnosis not present

## 2015-03-09 DIAGNOSIS — M10031 Idiopathic gout, right wrist: Secondary | ICD-10-CM | POA: Diagnosis not present

## 2015-03-09 DIAGNOSIS — M069 Rheumatoid arthritis, unspecified: Secondary | ICD-10-CM | POA: Diagnosis not present

## 2015-03-10 DIAGNOSIS — E1122 Type 2 diabetes mellitus with diabetic chronic kidney disease: Secondary | ICD-10-CM | POA: Diagnosis not present

## 2015-03-10 DIAGNOSIS — M19041 Primary osteoarthritis, right hand: Secondary | ICD-10-CM | POA: Diagnosis not present

## 2015-03-10 DIAGNOSIS — E1142 Type 2 diabetes mellitus with diabetic polyneuropathy: Secondary | ICD-10-CM | POA: Diagnosis not present

## 2015-03-10 DIAGNOSIS — M10031 Idiopathic gout, right wrist: Secondary | ICD-10-CM | POA: Diagnosis not present

## 2015-03-10 DIAGNOSIS — M069 Rheumatoid arthritis, unspecified: Secondary | ICD-10-CM | POA: Diagnosis not present

## 2015-03-10 DIAGNOSIS — I69332 Monoplegia of upper limb following cerebral infarction affecting left dominant side: Secondary | ICD-10-CM | POA: Diagnosis not present

## 2015-03-13 DIAGNOSIS — M19041 Primary osteoarthritis, right hand: Secondary | ICD-10-CM | POA: Diagnosis not present

## 2015-03-13 DIAGNOSIS — I69332 Monoplegia of upper limb following cerebral infarction affecting left dominant side: Secondary | ICD-10-CM | POA: Diagnosis not present

## 2015-03-13 DIAGNOSIS — E1122 Type 2 diabetes mellitus with diabetic chronic kidney disease: Secondary | ICD-10-CM | POA: Diagnosis not present

## 2015-03-13 DIAGNOSIS — M10031 Idiopathic gout, right wrist: Secondary | ICD-10-CM | POA: Diagnosis not present

## 2015-03-13 DIAGNOSIS — E1142 Type 2 diabetes mellitus with diabetic polyneuropathy: Secondary | ICD-10-CM | POA: Diagnosis not present

## 2015-03-13 DIAGNOSIS — M069 Rheumatoid arthritis, unspecified: Secondary | ICD-10-CM | POA: Diagnosis not present

## 2015-03-14 DIAGNOSIS — E1142 Type 2 diabetes mellitus with diabetic polyneuropathy: Secondary | ICD-10-CM | POA: Diagnosis not present

## 2015-03-14 DIAGNOSIS — I69332 Monoplegia of upper limb following cerebral infarction affecting left dominant side: Secondary | ICD-10-CM | POA: Diagnosis not present

## 2015-03-14 DIAGNOSIS — E1122 Type 2 diabetes mellitus with diabetic chronic kidney disease: Secondary | ICD-10-CM | POA: Diagnosis not present

## 2015-03-14 DIAGNOSIS — M10031 Idiopathic gout, right wrist: Secondary | ICD-10-CM | POA: Diagnosis not present

## 2015-03-14 DIAGNOSIS — M069 Rheumatoid arthritis, unspecified: Secondary | ICD-10-CM | POA: Diagnosis not present

## 2015-03-14 DIAGNOSIS — M19041 Primary osteoarthritis, right hand: Secondary | ICD-10-CM | POA: Diagnosis not present

## 2015-03-15 ENCOUNTER — Ambulatory Visit
Admission: RE | Admit: 2015-03-15 | Discharge: 2015-03-15 | Disposition: A | Discharge status: Home / Self Care | Payer: Medicare Other | Source: Ambulatory Visit | Attending: Nephrology | Admitting: Nephrology

## 2015-03-15 DIAGNOSIS — N183 Chronic kidney disease, stage 3 unspecified: Secondary | ICD-10-CM

## 2015-03-16 DIAGNOSIS — E1122 Type 2 diabetes mellitus with diabetic chronic kidney disease: Secondary | ICD-10-CM | POA: Diagnosis not present

## 2015-03-16 DIAGNOSIS — M10031 Idiopathic gout, right wrist: Secondary | ICD-10-CM | POA: Diagnosis not present

## 2015-03-16 DIAGNOSIS — I69332 Monoplegia of upper limb following cerebral infarction affecting left dominant side: Secondary | ICD-10-CM | POA: Diagnosis not present

## 2015-03-16 DIAGNOSIS — M19041 Primary osteoarthritis, right hand: Secondary | ICD-10-CM | POA: Diagnosis not present

## 2015-03-16 DIAGNOSIS — M069 Rheumatoid arthritis, unspecified: Secondary | ICD-10-CM | POA: Diagnosis not present

## 2015-03-16 DIAGNOSIS — E1142 Type 2 diabetes mellitus with diabetic polyneuropathy: Secondary | ICD-10-CM | POA: Diagnosis not present

## 2015-03-17 ENCOUNTER — Inpatient Hospital Stay: Payer: Medicare Other | Attending: Family Medicine

## 2015-03-17 DIAGNOSIS — D509 Iron deficiency anemia, unspecified: Secondary | ICD-10-CM | POA: Diagnosis not present

## 2015-03-17 DIAGNOSIS — N189 Chronic kidney disease, unspecified: Secondary | ICD-10-CM

## 2015-03-17 DIAGNOSIS — D631 Anemia in chronic kidney disease: Secondary | ICD-10-CM

## 2015-03-17 LAB — SAMPLE TO BLOOD BANK

## 2015-03-17 LAB — HEMOGLOBIN: Hemoglobin: 9.1 g/dL — ABNORMAL LOW (ref 13.0–18.0)

## 2015-03-20 ENCOUNTER — Encounter: Payer: Self-pay | Admitting: Internal Medicine

## 2015-03-21 DIAGNOSIS — M069 Rheumatoid arthritis, unspecified: Secondary | ICD-10-CM | POA: Diagnosis not present

## 2015-03-21 DIAGNOSIS — M19041 Primary osteoarthritis, right hand: Secondary | ICD-10-CM | POA: Diagnosis not present

## 2015-03-21 DIAGNOSIS — E1142 Type 2 diabetes mellitus with diabetic polyneuropathy: Secondary | ICD-10-CM | POA: Diagnosis not present

## 2015-03-21 DIAGNOSIS — I10 Essential (primary) hypertension: Secondary | ICD-10-CM | POA: Diagnosis not present

## 2015-03-21 DIAGNOSIS — I69332 Monoplegia of upper limb following cerebral infarction affecting left dominant side: Secondary | ICD-10-CM | POA: Diagnosis not present

## 2015-03-21 DIAGNOSIS — E1122 Type 2 diabetes mellitus with diabetic chronic kidney disease: Secondary | ICD-10-CM | POA: Diagnosis not present

## 2015-03-21 DIAGNOSIS — D631 Anemia in chronic kidney disease: Secondary | ICD-10-CM | POA: Diagnosis not present

## 2015-03-21 DIAGNOSIS — N183 Chronic kidney disease, stage 3 (moderate): Secondary | ICD-10-CM | POA: Diagnosis not present

## 2015-03-21 DIAGNOSIS — R809 Proteinuria, unspecified: Secondary | ICD-10-CM | POA: Diagnosis not present

## 2015-03-21 DIAGNOSIS — M10031 Idiopathic gout, right wrist: Secondary | ICD-10-CM | POA: Diagnosis not present

## 2015-03-22 DIAGNOSIS — M19041 Primary osteoarthritis, right hand: Secondary | ICD-10-CM | POA: Diagnosis not present

## 2015-03-22 DIAGNOSIS — E1142 Type 2 diabetes mellitus with diabetic polyneuropathy: Secondary | ICD-10-CM | POA: Diagnosis not present

## 2015-03-22 DIAGNOSIS — E1122 Type 2 diabetes mellitus with diabetic chronic kidney disease: Secondary | ICD-10-CM | POA: Diagnosis not present

## 2015-03-22 DIAGNOSIS — M069 Rheumatoid arthritis, unspecified: Secondary | ICD-10-CM | POA: Diagnosis not present

## 2015-03-22 DIAGNOSIS — I69332 Monoplegia of upper limb following cerebral infarction affecting left dominant side: Secondary | ICD-10-CM | POA: Diagnosis not present

## 2015-03-22 DIAGNOSIS — M10031 Idiopathic gout, right wrist: Secondary | ICD-10-CM | POA: Diagnosis not present

## 2015-03-23 ENCOUNTER — Telehealth: Payer: Self-pay | Admitting: Internal Medicine

## 2015-03-23 DIAGNOSIS — I4891 Unspecified atrial fibrillation: Secondary | ICD-10-CM | POA: Diagnosis not present

## 2015-03-23 DIAGNOSIS — H3530 Unspecified macular degeneration: Secondary | ICD-10-CM | POA: Diagnosis not present

## 2015-03-23 DIAGNOSIS — Z9181 History of falling: Secondary | ICD-10-CM | POA: Diagnosis not present

## 2015-03-23 DIAGNOSIS — N400 Enlarged prostate without lower urinary tract symptoms: Secondary | ICD-10-CM | POA: Diagnosis not present

## 2015-03-23 DIAGNOSIS — D649 Anemia, unspecified: Secondary | ICD-10-CM | POA: Diagnosis not present

## 2015-03-23 DIAGNOSIS — E1142 Type 2 diabetes mellitus with diabetic polyneuropathy: Secondary | ICD-10-CM | POA: Diagnosis not present

## 2015-03-23 DIAGNOSIS — I129 Hypertensive chronic kidney disease with stage 1 through stage 4 chronic kidney disease, or unspecified chronic kidney disease: Secondary | ICD-10-CM | POA: Diagnosis not present

## 2015-03-23 DIAGNOSIS — M069 Rheumatoid arthritis, unspecified: Secondary | ICD-10-CM | POA: Diagnosis not present

## 2015-03-23 DIAGNOSIS — I350 Nonrheumatic aortic (valve) stenosis: Secondary | ICD-10-CM | POA: Diagnosis not present

## 2015-03-23 DIAGNOSIS — M19041 Primary osteoarthritis, right hand: Secondary | ICD-10-CM | POA: Diagnosis not present

## 2015-03-23 DIAGNOSIS — N183 Chronic kidney disease, stage 3 (moderate): Secondary | ICD-10-CM | POA: Diagnosis not present

## 2015-03-23 DIAGNOSIS — I69332 Monoplegia of upper limb following cerebral infarction affecting left dominant side: Secondary | ICD-10-CM | POA: Diagnosis not present

## 2015-03-23 DIAGNOSIS — Z794 Long term (current) use of insulin: Secondary | ICD-10-CM | POA: Diagnosis not present

## 2015-03-23 DIAGNOSIS — M1990 Unspecified osteoarthritis, unspecified site: Secondary | ICD-10-CM | POA: Diagnosis not present

## 2015-03-23 DIAGNOSIS — Z7902 Long term (current) use of antithrombotics/antiplatelets: Secondary | ICD-10-CM | POA: Diagnosis not present

## 2015-03-23 DIAGNOSIS — M10031 Idiopathic gout, right wrist: Secondary | ICD-10-CM | POA: Diagnosis not present

## 2015-03-23 DIAGNOSIS — I2510 Atherosclerotic heart disease of native coronary artery without angina pectoris: Secondary | ICD-10-CM | POA: Diagnosis not present

## 2015-03-23 DIAGNOSIS — E1122 Type 2 diabetes mellitus with diabetic chronic kidney disease: Secondary | ICD-10-CM | POA: Diagnosis not present

## 2015-03-23 NOTE — Telephone Encounter (Signed)
Left message to return my call.  

## 2015-03-23 NOTE — Telephone Encounter (Signed)
Estill Bamberg called from [facility] needing a verbal order to continue to get OT for 2 x a week for three weeks for fine motor corrdination. Estill Bamberg number is [phone] and message can be left on vm. Thank You!

## 2015-03-27 DIAGNOSIS — M10031 Idiopathic gout, right wrist: Secondary | ICD-10-CM | POA: Diagnosis not present

## 2015-03-27 DIAGNOSIS — M069 Rheumatoid arthritis, unspecified: Secondary | ICD-10-CM | POA: Diagnosis not present

## 2015-03-27 DIAGNOSIS — I69332 Monoplegia of upper limb following cerebral infarction affecting left dominant side: Secondary | ICD-10-CM | POA: Diagnosis not present

## 2015-03-27 DIAGNOSIS — M19041 Primary osteoarthritis, right hand: Secondary | ICD-10-CM | POA: Diagnosis not present

## 2015-03-27 DIAGNOSIS — E1122 Type 2 diabetes mellitus with diabetic chronic kidney disease: Secondary | ICD-10-CM | POA: Diagnosis not present

## 2015-03-27 DIAGNOSIS — E1142 Type 2 diabetes mellitus with diabetic polyneuropathy: Secondary | ICD-10-CM | POA: Diagnosis not present

## 2015-03-28 ENCOUNTER — Inpatient Hospital Stay: Payer: Medicare Other

## 2015-03-29 ENCOUNTER — Other Ambulatory Visit: Payer: Self-pay | Admitting: Nephrology

## 2015-03-29 ENCOUNTER — Telehealth: Payer: Self-pay | Admitting: Internal Medicine

## 2015-03-29 DIAGNOSIS — E1142 Type 2 diabetes mellitus with diabetic polyneuropathy: Secondary | ICD-10-CM | POA: Diagnosis not present

## 2015-03-29 DIAGNOSIS — M19041 Primary osteoarthritis, right hand: Secondary | ICD-10-CM | POA: Diagnosis not present

## 2015-03-29 DIAGNOSIS — I69332 Monoplegia of upper limb following cerebral infarction affecting left dominant side: Secondary | ICD-10-CM | POA: Diagnosis not present

## 2015-03-29 DIAGNOSIS — N2889 Other specified disorders of kidney and ureter: Secondary | ICD-10-CM

## 2015-03-29 DIAGNOSIS — M069 Rheumatoid arthritis, unspecified: Secondary | ICD-10-CM | POA: Diagnosis not present

## 2015-03-29 DIAGNOSIS — E1122 Type 2 diabetes mellitus with diabetic chronic kidney disease: Secondary | ICD-10-CM | POA: Diagnosis not present

## 2015-03-29 DIAGNOSIS — M10031 Idiopathic gout, right wrist: Secondary | ICD-10-CM | POA: Diagnosis not present

## 2015-03-29 NOTE — Telephone Encounter (Signed)
Called [name].  Advised to stop Novolog completely and reduce Lantus dose to 45 units.

## 2015-03-29 NOTE — Telephone Encounter (Signed)
Thanks so much. 

## 2015-03-30 DIAGNOSIS — M10031 Idiopathic gout, right wrist: Secondary | ICD-10-CM | POA: Diagnosis not present

## 2015-03-30 DIAGNOSIS — M069 Rheumatoid arthritis, unspecified: Secondary | ICD-10-CM | POA: Diagnosis not present

## 2015-03-30 DIAGNOSIS — E1142 Type 2 diabetes mellitus with diabetic polyneuropathy: Secondary | ICD-10-CM | POA: Diagnosis not present

## 2015-03-30 DIAGNOSIS — E1122 Type 2 diabetes mellitus with diabetic chronic kidney disease: Secondary | ICD-10-CM | POA: Diagnosis not present

## 2015-03-30 DIAGNOSIS — M19041 Primary osteoarthritis, right hand: Secondary | ICD-10-CM | POA: Diagnosis not present

## 2015-03-30 DIAGNOSIS — I69332 Monoplegia of upper limb following cerebral infarction affecting left dominant side: Secondary | ICD-10-CM | POA: Diagnosis not present

## 2015-03-31 ENCOUNTER — Other Ambulatory Visit: Payer: Self-pay | Admitting: Internal Medicine

## 2015-03-31 ENCOUNTER — Telehealth: Payer: Self-pay

## 2015-03-31 NOTE — Telephone Encounter (Signed)
Pt would like Eliquis samples. Please call and let know when ready

## 2015-03-31 NOTE — Telephone Encounter (Signed)
Eliquis 2.5 mg samples placed at front desk for pick up.

## 2015-04-04 DIAGNOSIS — M19041 Primary osteoarthritis, right hand: Secondary | ICD-10-CM | POA: Diagnosis not present

## 2015-04-04 DIAGNOSIS — E1122 Type 2 diabetes mellitus with diabetic chronic kidney disease: Secondary | ICD-10-CM | POA: Diagnosis not present

## 2015-04-04 DIAGNOSIS — M10031 Idiopathic gout, right wrist: Secondary | ICD-10-CM | POA: Diagnosis not present

## 2015-04-04 DIAGNOSIS — E1142 Type 2 diabetes mellitus with diabetic polyneuropathy: Secondary | ICD-10-CM | POA: Diagnosis not present

## 2015-04-04 DIAGNOSIS — I69332 Monoplegia of upper limb following cerebral infarction affecting left dominant side: Secondary | ICD-10-CM | POA: Diagnosis not present

## 2015-04-04 DIAGNOSIS — M069 Rheumatoid arthritis, unspecified: Secondary | ICD-10-CM | POA: Diagnosis not present

## 2015-04-05 DIAGNOSIS — M069 Rheumatoid arthritis, unspecified: Secondary | ICD-10-CM | POA: Diagnosis not present

## 2015-04-05 DIAGNOSIS — M19041 Primary osteoarthritis, right hand: Secondary | ICD-10-CM | POA: Diagnosis not present

## 2015-04-05 DIAGNOSIS — E1122 Type 2 diabetes mellitus with diabetic chronic kidney disease: Secondary | ICD-10-CM | POA: Diagnosis not present

## 2015-04-05 DIAGNOSIS — E1142 Type 2 diabetes mellitus with diabetic polyneuropathy: Secondary | ICD-10-CM | POA: Diagnosis not present

## 2015-04-05 DIAGNOSIS — I69332 Monoplegia of upper limb following cerebral infarction affecting left dominant side: Secondary | ICD-10-CM | POA: Diagnosis not present

## 2015-04-05 DIAGNOSIS — M10031 Idiopathic gout, right wrist: Secondary | ICD-10-CM | POA: Diagnosis not present

## 2015-04-06 ENCOUNTER — Other Ambulatory Visit: Payer: Self-pay | Admitting: Nephrology

## 2015-04-06 ENCOUNTER — Ambulatory Visit
Admission: RE | Admit: 2015-04-06 | Discharge: 2015-04-06 | Disposition: A | Discharge status: Home / Self Care | Payer: Medicare Other | Source: Ambulatory Visit | Attending: Nephrology | Admitting: Nephrology

## 2015-04-06 DIAGNOSIS — E1142 Type 2 diabetes mellitus with diabetic polyneuropathy: Secondary | ICD-10-CM | POA: Diagnosis not present

## 2015-04-06 DIAGNOSIS — M10031 Idiopathic gout, right wrist: Secondary | ICD-10-CM | POA: Diagnosis not present

## 2015-04-06 DIAGNOSIS — N2889 Other specified disorders of kidney and ureter: Secondary | ICD-10-CM | POA: Diagnosis present

## 2015-04-06 DIAGNOSIS — E1122 Type 2 diabetes mellitus with diabetic chronic kidney disease: Secondary | ICD-10-CM | POA: Diagnosis not present

## 2015-04-06 DIAGNOSIS — K8020 Calculus of gallbladder without cholecystitis without obstruction: Secondary | ICD-10-CM | POA: Insufficient documentation

## 2015-04-06 DIAGNOSIS — M069 Rheumatoid arthritis, unspecified: Secondary | ICD-10-CM | POA: Diagnosis not present

## 2015-04-06 DIAGNOSIS — N281 Cyst of kidney, acquired: Secondary | ICD-10-CM | POA: Insufficient documentation

## 2015-04-06 DIAGNOSIS — M19041 Primary osteoarthritis, right hand: Secondary | ICD-10-CM | POA: Diagnosis not present

## 2015-04-06 DIAGNOSIS — I69332 Monoplegia of upper limb following cerebral infarction affecting left dominant side: Secondary | ICD-10-CM | POA: Diagnosis not present

## 2015-04-06 LAB — POCT I-STAT CREATININE: CREATININE: 2 mg/dL — AB (ref 0.61–1.24)

## 2015-04-10 DIAGNOSIS — H6123 Impacted cerumen, bilateral: Secondary | ICD-10-CM | POA: Diagnosis not present

## 2015-04-10 DIAGNOSIS — H903 Sensorineural hearing loss, bilateral: Secondary | ICD-10-CM | POA: Diagnosis not present

## 2015-04-12 ENCOUNTER — Ambulatory Visit (INDEPENDENT_AMBULATORY_CARE_PROVIDER_SITE_OTHER): Payer: Medicare Other | Admitting: Family Medicine

## 2015-04-12 ENCOUNTER — Emergency Department (HOSPITAL_COMMUNITY): Payer: Medicare Other

## 2015-04-12 ENCOUNTER — Encounter: Payer: Self-pay | Admitting: Family Medicine

## 2015-04-12 ENCOUNTER — Telehealth: Payer: Self-pay | Admitting: *Deleted

## 2015-04-12 ENCOUNTER — Encounter (HOSPITAL_COMMUNITY): Payer: Self-pay | Admitting: Internal Medicine

## 2015-04-12 ENCOUNTER — Encounter (HOSPITAL_COMMUNITY): Payer: Self-pay | Admitting: *Deleted

## 2015-04-12 ENCOUNTER — Inpatient Hospital Stay (HOSPITAL_COMMUNITY)
Admission: EM | Admit: 2015-04-12 | Discharge: 2015-04-17 | DRG: 377 | Disposition: A | Discharge status: Skilled Nursing Facility | Payer: Medicare Other | Attending: Internal Medicine | Admitting: Internal Medicine

## 2015-04-12 ENCOUNTER — Telehealth: Payer: Self-pay | Admitting: Internal Medicine

## 2015-04-12 VITALS — BP 148/72 | HR 73 | Temp 98.0°F | Ht 70.0 in | Wt 244.0 lb

## 2015-04-12 DIAGNOSIS — Z7901 Long term (current) use of anticoagulants: Secondary | ICD-10-CM | POA: Insufficient documentation

## 2015-04-12 DIAGNOSIS — E11649 Type 2 diabetes mellitus with hypoglycemia without coma: Secondary | ICD-10-CM | POA: Diagnosis present

## 2015-04-12 DIAGNOSIS — I10 Essential (primary) hypertension: Secondary | ICD-10-CM | POA: Diagnosis not present

## 2015-04-12 DIAGNOSIS — R109 Unspecified abdominal pain: Secondary | ICD-10-CM

## 2015-04-12 DIAGNOSIS — R197 Diarrhea, unspecified: Secondary | ICD-10-CM | POA: Diagnosis present

## 2015-04-12 DIAGNOSIS — Z87891 Personal history of nicotine dependence: Secondary | ICD-10-CM

## 2015-04-12 DIAGNOSIS — M069 Rheumatoid arthritis, unspecified: Secondary | ICD-10-CM | POA: Diagnosis present

## 2015-04-12 DIAGNOSIS — I447 Left bundle-branch block, unspecified: Secondary | ICD-10-CM | POA: Diagnosis present

## 2015-04-12 DIAGNOSIS — I482 Chronic atrial fibrillation, unspecified: Secondary | ICD-10-CM | POA: Diagnosis present

## 2015-04-12 DIAGNOSIS — I5033 Acute on chronic diastolic (congestive) heart failure: Secondary | ICD-10-CM | POA: Diagnosis present

## 2015-04-12 DIAGNOSIS — K921 Melena: Secondary | ICD-10-CM | POA: Diagnosis not present

## 2015-04-12 DIAGNOSIS — I5043 Acute on chronic combined systolic (congestive) and diastolic (congestive) heart failure: Secondary | ICD-10-CM | POA: Insufficient documentation

## 2015-04-12 DIAGNOSIS — E1165 Type 2 diabetes mellitus with hyperglycemia: Secondary | ICD-10-CM | POA: Diagnosis present

## 2015-04-12 DIAGNOSIS — N179 Acute kidney failure, unspecified: Secondary | ICD-10-CM | POA: Diagnosis present

## 2015-04-12 DIAGNOSIS — I5041 Acute combined systolic (congestive) and diastolic (congestive) heart failure: Secondary | ICD-10-CM | POA: Diagnosis not present

## 2015-04-12 DIAGNOSIS — E785 Hyperlipidemia, unspecified: Secondary | ICD-10-CM | POA: Diagnosis present

## 2015-04-12 DIAGNOSIS — Z794 Long term (current) use of insulin: Secondary | ICD-10-CM | POA: Diagnosis not present

## 2015-04-12 DIAGNOSIS — D62 Acute posthemorrhagic anemia: Secondary | ICD-10-CM | POA: Diagnosis not present

## 2015-04-12 DIAGNOSIS — D649 Anemia, unspecified: Secondary | ICD-10-CM

## 2015-04-12 DIAGNOSIS — I130 Hypertensive heart and chronic kidney disease with heart failure and stage 1 through stage 4 chronic kidney disease, or unspecified chronic kidney disease: Secondary | ICD-10-CM | POA: Diagnosis present

## 2015-04-12 DIAGNOSIS — Z951 Presence of aortocoronary bypass graft: Secondary | ICD-10-CM

## 2015-04-12 DIAGNOSIS — R0682 Tachypnea, not elsewhere classified: Secondary | ICD-10-CM | POA: Diagnosis not present

## 2015-04-12 DIAGNOSIS — I63411 Cerebral infarction due to embolism of right middle cerebral artery: Secondary | ICD-10-CM | POA: Diagnosis not present

## 2015-04-12 DIAGNOSIS — R0602 Shortness of breath: Secondary | ICD-10-CM | POA: Diagnosis present

## 2015-04-12 DIAGNOSIS — Z8711 Personal history of peptic ulcer disease: Secondary | ICD-10-CM | POA: Diagnosis not present

## 2015-04-12 DIAGNOSIS — Z89421 Acquired absence of other right toe(s): Secondary | ICD-10-CM

## 2015-04-12 DIAGNOSIS — K31819 Angiodysplasia of stomach and duodenum without bleeding: Secondary | ICD-10-CM | POA: Insufficient documentation

## 2015-04-12 DIAGNOSIS — Z953 Presence of xenogenic heart valve: Secondary | ICD-10-CM | POA: Diagnosis not present

## 2015-04-12 DIAGNOSIS — E1121 Type 2 diabetes mellitus with diabetic nephropathy: Secondary | ICD-10-CM

## 2015-04-12 DIAGNOSIS — R001 Bradycardia, unspecified: Secondary | ICD-10-CM | POA: Diagnosis present

## 2015-04-12 DIAGNOSIS — I2510 Atherosclerotic heart disease of native coronary artery without angina pectoris: Secondary | ICD-10-CM | POA: Diagnosis not present

## 2015-04-12 DIAGNOSIS — I69339 Monoplegia of upper limb following cerebral infarction affecting unspecified side: Secondary | ICD-10-CM | POA: Diagnosis not present

## 2015-04-12 DIAGNOSIS — N183 Chronic kidney disease, stage 3 unspecified: Secondary | ICD-10-CM | POA: Diagnosis present

## 2015-04-12 DIAGNOSIS — M6281 Muscle weakness (generalized): Secondary | ICD-10-CM | POA: Diagnosis not present

## 2015-04-12 DIAGNOSIS — K922 Gastrointestinal hemorrhage, unspecified: Secondary | ICD-10-CM | POA: Diagnosis not present

## 2015-04-12 DIAGNOSIS — Z954 Presence of other heart-valve replacement: Secondary | ICD-10-CM

## 2015-04-12 DIAGNOSIS — I69354 Hemiplegia and hemiparesis following cerebral infarction affecting left non-dominant side: Secondary | ICD-10-CM | POA: Diagnosis not present

## 2015-04-12 DIAGNOSIS — F419 Anxiety disorder, unspecified: Secondary | ICD-10-CM | POA: Diagnosis present

## 2015-04-12 DIAGNOSIS — R1084 Generalized abdominal pain: Secondary | ICD-10-CM | POA: Diagnosis not present

## 2015-04-12 DIAGNOSIS — K31811 Angiodysplasia of stomach and duodenum with bleeding: Secondary | ICD-10-CM | POA: Diagnosis not present

## 2015-04-12 DIAGNOSIS — R278 Other lack of coordination: Secondary | ICD-10-CM | POA: Diagnosis not present

## 2015-04-12 DIAGNOSIS — Z89422 Acquired absence of other left toe(s): Secondary | ICD-10-CM

## 2015-04-12 DIAGNOSIS — Z952 Presence of prosthetic heart valve: Secondary | ICD-10-CM

## 2015-04-12 DIAGNOSIS — D500 Iron deficiency anemia secondary to blood loss (chronic): Secondary | ICD-10-CM | POA: Diagnosis present

## 2015-04-12 DIAGNOSIS — M1990 Unspecified osteoarthritis, unspecified site: Secondary | ICD-10-CM | POA: Diagnosis not present

## 2015-04-12 DIAGNOSIS — Z79899 Other long term (current) drug therapy: Secondary | ICD-10-CM

## 2015-04-12 DIAGNOSIS — E1122 Type 2 diabetes mellitus with diabetic chronic kidney disease: Secondary | ICD-10-CM | POA: Diagnosis present

## 2015-04-12 DIAGNOSIS — D631 Anemia in chronic kidney disease: Secondary | ICD-10-CM | POA: Diagnosis present

## 2015-04-12 DIAGNOSIS — Z8551 Personal history of malignant neoplasm of bladder: Secondary | ICD-10-CM

## 2015-04-12 DIAGNOSIS — R2681 Unsteadiness on feet: Secondary | ICD-10-CM | POA: Diagnosis not present

## 2015-04-12 DIAGNOSIS — E1142 Type 2 diabetes mellitus with diabetic polyneuropathy: Secondary | ICD-10-CM

## 2015-04-12 DIAGNOSIS — IMO0002 Reserved for concepts with insufficient information to code with codable children: Secondary | ICD-10-CM | POA: Diagnosis present

## 2015-04-12 DIAGNOSIS — N400 Enlarged prostate without lower urinary tract symptoms: Secondary | ICD-10-CM | POA: Diagnosis present

## 2015-04-12 DIAGNOSIS — K3189 Other diseases of stomach and duodenum: Secondary | ICD-10-CM | POA: Diagnosis not present

## 2015-04-12 DIAGNOSIS — K5520 Angiodysplasia of colon without hemorrhage: Secondary | ICD-10-CM | POA: Diagnosis not present

## 2015-04-12 LAB — BASIC METABOLIC PANEL
Anion gap: 9 (ref 5–15)
BUN: 34 mg/dL — AB (ref 6–20)
CHLORIDE: 104 mmol/L (ref 101–111)
CO2: 24 mmol/L (ref 22–32)
CREATININE: 2.06 mg/dL — AB (ref 0.61–1.24)
Calcium: 9.1 mg/dL (ref 8.9–10.3)
GFR calc Af Amer: 32 mL/min — ABNORMAL LOW (ref >60)
GFR calc non Af Amer: 28 mL/min — ABNORMAL LOW (ref >60)
GLUCOSE: 91 mg/dL (ref 65–99)
Potassium: 4.9 mmol/L (ref 3.5–5.1)
Sodium: 137 mmol/L (ref 135–145)

## 2015-04-12 LAB — PROTIME-INR
INR: 1.2 (ref 0.00–1.49)
PROTHROMBIN TIME: 15.4 s — AB (ref 11.6–15.2)

## 2015-04-12 LAB — CBC
HCT: 28.2 % — ABNORMAL LOW (ref 39.0–52.0)
HEMATOCRIT: 26.7 % — AB (ref 39.0–52.0)
Hemoglobin: 8.2 g/dL — ABNORMAL LOW (ref 13.0–17.0)
Hemoglobin: 8.5 g/dL — ABNORMAL LOW (ref 13.0–17.0)
MCH: 25.8 pg — AB (ref 26.0–34.0)
MCH: 25.5 pg — AB (ref 26.0–34.0)
MCHC: 30.7 g/dL (ref 30.0–36.0)
MCHC: 30.1 g/dL (ref 30.0–36.0)
MCV: 84 fL (ref 78.0–100.0)
MCV: 84.7 fL (ref 78.0–100.0)
PLATELETS: 229 10*3/uL (ref 150–400)
Platelets: 242 10*3/uL (ref 150–400)
RBC: 3.18 MIL/uL — ABNORMAL LOW (ref 4.22–5.81)
RBC: 3.33 MIL/uL — AB (ref 4.22–5.81)
RDW: 14.3 % (ref 11.5–15.5)
RDW: 14.4 % (ref 11.5–15.5)
WBC: 5.6 10*3/uL (ref 4.0–10.5)
WBC: 6.8 10*3/uL (ref 4.0–10.5)

## 2015-04-12 LAB — HEPATIC FUNCTION PANEL
ALBUMIN: 3.5 g/dL (ref 3.5–5.0)
ALK PHOS: 94 U/L (ref 38–126)
ALT: 16 U/L — AB (ref 17–63)
AST: 27 U/L (ref 15–41)
BILIRUBIN INDIRECT: 0.5 mg/dL (ref 0.3–0.9)
BILIRUBIN TOTAL: 0.6 mg/dL (ref 0.3–1.2)
Bilirubin, Direct: 0.1 mg/dL (ref 0.1–0.5)
TOTAL PROTEIN: 6.4 g/dL — AB (ref 6.5–8.1)

## 2015-04-12 LAB — POC OCCULT BLOOD, ED: Fecal Occult Bld: POSITIVE — AB

## 2015-04-12 LAB — MRSA PCR SCREENING: MRSA by PCR: NEGATIVE

## 2015-04-12 LAB — CBG MONITORING, ED
GLUCOSE-CAPILLARY: 131 mg/dL — AB (ref 65–99)
Glucose-Capillary: 46 mg/dL — ABNORMAL LOW (ref 65–99)

## 2015-04-12 LAB — I-STAT CG4 LACTIC ACID, ED: Lactic Acid, Venous: 1.06 mmol/L (ref 0.5–2.0)

## 2015-04-12 LAB — LIPASE, BLOOD: LIPASE: 25 U/L (ref 11–51)

## 2015-04-12 LAB — PREPARE RBC (CROSSMATCH)

## 2015-04-12 LAB — APTT: APTT: 34 s (ref 24–37)

## 2015-04-12 LAB — GLUCOSE, CAPILLARY: Glucose-Capillary: 220 mg/dL — ABNORMAL HIGH (ref 65–99)

## 2015-04-12 MED ORDER — INSULIN GLARGINE 100 UNIT/ML ~~LOC~~ SOLN
15.0000 [IU] | Freq: Every day | SUBCUTANEOUS | Status: DC
  Filled 2015-04-12: qty 0.15

## 2015-04-12 MED ORDER — LORAZEPAM 2 MG/ML IJ SOLN
0.5000 mg | Freq: Once | INTRAMUSCULAR | Status: AC
  Administered 2015-04-12: 0.5 mg via INTRAVENOUS
  Filled 2015-04-12: qty 1

## 2015-04-12 MED ORDER — SODIUM CHLORIDE 0.9 % IV SOLN: Freq: Once | INTRAVENOUS | Status: DC

## 2015-04-12 MED ORDER — ONDANSETRON HCL 4 MG/2ML IJ SOLN: 4.0000 mg | Freq: Four times a day (QID) | INTRAMUSCULAR | Status: DC | PRN

## 2015-04-12 MED ORDER — SODIUM CHLORIDE 0.9 % IV SOLN
80.0000 mg | Freq: Once | INTRAVENOUS | Status: AC
  Administered 2015-04-12: 80 mg via INTRAVENOUS
  Filled 2015-04-12: qty 80

## 2015-04-12 MED ORDER — ONDANSETRON HCL 4 MG PO TABS: 4.0000 mg | ORAL_TABLET | Freq: Four times a day (QID) | ORAL | Status: DC | PRN

## 2015-04-12 MED ORDER — SODIUM CHLORIDE 0.9 % IV SOLN
8.0000 mg/h | INTRAVENOUS | Status: DC
  Administered 2015-04-12 – 2015-04-14 (×5): 8 mg/h via INTRAVENOUS
  Filled 2015-04-12 (×10): qty 80

## 2015-04-12 MED ORDER — DEXTROSE 50 % IV SOLN
INTRAVENOUS | Status: AC
  Filled 2015-04-12: qty 50

## 2015-04-12 MED ORDER — PANTOPRAZOLE SODIUM 40 MG IV SOLR: 40.0000 mg | Freq: Two times a day (BID) | INTRAVENOUS | Status: DC

## 2015-04-12 MED ORDER — CARVEDILOL 6.25 MG PO TABS
6.2500 mg | ORAL_TABLET | Freq: Two times a day (BID) | ORAL | Status: DC
  Administered 2015-04-13 – 2015-04-15 (×6): 6.25 mg via ORAL
  Filled 2015-04-12 (×6): qty 1

## 2015-04-12 MED ORDER — IOHEXOL 300 MG/ML  SOLN: 25.0000 mL | INTRAMUSCULAR | Status: DC

## 2015-04-12 MED ORDER — HYDRALAZINE HCL 20 MG/ML IJ SOLN
10.0000 mg | Freq: Once | INTRAMUSCULAR | Status: AC
  Administered 2015-04-12: 10 mg via INTRAVENOUS
  Filled 2015-04-12: qty 1

## 2015-04-12 MED ORDER — DEXTROSE 50 % IV SOLN
1.0000 | Freq: Once | INTRAVENOUS | Status: AC
  Administered 2015-04-12: 50 mL via INTRAVENOUS

## 2015-04-12 MED ORDER — DEXTROSE-NACL 5-0.9 % IV SOLN
INTRAVENOUS | Status: DC
  Administered 2015-04-12 – 2015-04-13 (×2): via INTRAVENOUS

## 2015-04-12 MED ORDER — SODIUM CHLORIDE 0.9 % IJ SOLN
3.0000 mL | Freq: Two times a day (BID) | INTRAMUSCULAR | Status: DC
  Administered 2015-04-12 – 2015-04-17 (×9): 3 mL via INTRAVENOUS

## 2015-04-12 MED ORDER — INSULIN GLARGINE 100 UNIT/ML ~~LOC~~ SOLN
15.0000 [IU] | Freq: Every day | SUBCUTANEOUS | Status: DC
  Administered 2015-04-13 – 2015-04-15 (×3): 15 [IU] via SUBCUTANEOUS
  Filled 2015-04-12 (×3): qty 0.15

## 2015-04-12 NOTE — Patient Instructions (Signed)
Nice to meet you. Please go to the ED for evaluation.  If you develop chest pain, shortness of breath, abdominal pain, bloody stool, light headedness, palpitations, or any new symptoms please call 911 immediately.

## 2015-04-12 NOTE — Telephone Encounter (Signed)
Patient called to get an appt with Dr. Derrel Nip due to Shortness of breath. Patient was sent to the nurse line, patient was advised to call 911, patient refused advised care, and wanted a appt to see Dr. Derrel Nip. Please advise

## 2015-04-12 NOTE — Telephone Encounter (Signed)
[name],  Make sure you check a CBC on him today.  He  Has significant anemia and wants treatment but baseline hgb has been  too high for transfusion (unless symptomatic) and he can't get aranesp bc of prior CVA

## 2015-04-12 NOTE — ED Notes (Signed)
Patient transported to X-ray 

## 2015-04-12 NOTE — Telephone Encounter (Signed)
Patient stated that last night he was just a little SOB and and feels weak today. Patient was able to speak in full sentences and did not pause for breath patient refused ED but stated he would see MD here at office scheduled with Dr. Caryl Bis.

## 2015-04-12 NOTE — Progress Notes (Signed)
Pre visit review using our clinic review tool, if applicable. No additional management support is needed unless otherwise documented below in the visit note. 
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
## 2015-04-12 NOTE — ED Notes (Signed)
Pt reports being sent from his MD for eval of generalized weakness, SOB and dark stools. Pt has hx of anemia

## 2015-04-12 NOTE — ED Notes (Signed)
Given 8 oz orange juice. Tolerating well.

## 2015-04-12 NOTE — Telephone Encounter (Signed)
Noted. Patient seen. See office visit note.

## 2015-04-12 NOTE — Assessment & Plan Note (Addendum)
Patient onset of shortness of breath yesterday. This is been associated with black stools. The patient was noted to be short of breath on moving just from the wheelchair to the table and on speaking for long periods of time. His oxygen is normal. His vital signs are stable. The concern with his abdominal pain and black stools is that he has had an ulceration that is bleeding or has some other GI bleed especially in somebody on eliquis. We did an EKG in the office that did not reveal A. fib though did reveal ectopic beats. I did discuss doing an FOBT on the patient, though given that my recommendation was for him to go to the emergency room he deferred to have this done in the ED. Discussed transport by EMS given his level of shortness of breath, abdominal discomfort, and black stool though he opted for transport via private vehicle with his daughter-in-law. He signed AMA paperwork after we discussed the risks of this. He'll go to the ED in [place] at his and his daughter-in-law's request. CMA called the charge nurse in the ED to inform them that the patient was on his way. Given reasons to call 911 as he is in route to [facility] in [place]
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
## 2015-04-12 NOTE — Telephone Encounter (Signed)
Patient [name]  DOB: Feb 17, 1928    Initial Comment Caller states c/o shortness of breath, weakness    Nurse Assessment  Nurse: Julien Girt RN, Almyra Free Date/Time Eilene Ghazi Time): 04/12/2015 12:20:27 PM  Confirm and document reason for call. If symptomatic, describe symptoms. ---Caller states he is sob and is very weak. States the difficulty breathing began last night, he had to prop up on pillows and is audibly sob at this time.  Has the patient traveled out of the country within the last 30 days? ---Not Applicable  Does the patient have any new or worsening symptoms? ---Yes  Will a triage be completed? ---Yes  Related visit to physician within the last 2 weeks? ---No  Does the PT have any chronic conditions? (i.e. diabetes, asthma, etc.) ---Yes  List chronic conditions. ---Low Hgb, htn, glaucoma, Diabetic ( BS 130)     Guidelines    Guideline Title Affirmed Question Affirmed Notes  Breathing Difficulty SEVERE difficulty breathing (e.g., struggling for each breath, speaks in single words) Very SOB   Final Disposition User   Call EMS 911 Now Julien Girt, RN, Almyra Free    Disagree/Comply: Disagree  Disagree/Comply Reason: Disagree with instructions

## 2015-04-12 NOTE — Progress Notes (Signed)
Patient ID: [name], male   DOB: Dec 29, 1927, 81 y.o.   MRN: [id number]  Tommi Rumps, MD Phone: [phone]  [name] is a 81 y.o. male who presents today for same-day appointment.  Shortness of breath: Patient notes starting last night when he is getting into bed he had increased shortness of breath. He felt like he just could not catch his breath. He notes since then the farthest he can walk without stopping for breath is 10 feet. He notes no chest pain with this. He does note that he had an epigastric aching sensation this morning followed by lots of gas. He has had some diarrhea today that is black. He notes a recent history of black stools. He has no abdominal pain right now. He overall feels fatigued and weak. CBG this morning was 130. He does have a history of anemia. He has had an aortic valve replacement and is in chronic A. fib.  PMH: Former smoker   ROS see history of present illness  Objective  Physical Exam Filed Vitals:   04/12/15 1407  BP: 148/72  Pulse: 73  Temp: 98 F (36.7 C)    Physical Exam  Constitutional:  No acute distress, patient is intermittently short of breath when talking at length, he did get increasingly short of breath with moving from the wheelchair to the table  HENT:  Head: Normocephalic and atraumatic.  Right Ear: External ear normal.  Left Ear: External ear normal.  Cardiovascular: Normal rate and regular rhythm.   Murmur (2/6 systolic murmur) heard. Pulmonary/Chest: Effort normal and breath sounds normal. No respiratory distress. He has no wheezes. He has no rales.  Abdominal: Soft. Bowel sounds are normal. He exhibits no distension and no mass. There is tenderness (Mild epigastric tenderness on palpation). There is no rebound and no guarding.  Neurological: He is alert.  Slow gait  Skin: Skin is warm and dry.   EKG: Rate 69, frequent ectopic ventricular beats with what appears to be regular underlying rhythm, no apparent  significant ST or T-wave changes  Assessment/Plan: Please see individual problem list.  Shortness of breath Patient onset of shortness of breath yesterday. This is been associated with black stools. The patient was noted to be short of breath on moving just from the wheelchair to the table and on speaking for long periods of time. His oxygen is normal. His vital signs are stable. The concern with his abdominal pain and black stools is that he has had an ulceration that is bleeding or has some other GI bleed especially in somebody on eliquis. We did an EKG in the office that did not reveal A. fib though did reveal ectopic beats. I did discuss doing an FOBT on the patient, though given that my recommendation was for him to go to the emergency room he deferred to have this done in the ED. Discussed transport by EMS given his level of shortness of breath, abdominal discomfort, and black stool though he opted for transport via private vehicle with his daughter-in-law. He signed AMA paperwork after we discussed the risks of this. He'll go to the ED in [place] at his and his daughter-in-law's request. CMA called the charge nurse in the ED to inform them that the patient was on his way. Given reasons to call 911 as he is in route to [facility] in [place] This Encounter  Procedures  . EKG 12-Lead    Tommi Rumps

## 2015-04-12 NOTE — Telephone Encounter (Signed)
Patient scheduled with Dr. Caryl Bis.

## 2015-04-12 NOTE — ED Notes (Signed)
1 unit blood ready 

## 2015-04-13 DIAGNOSIS — K921 Melena: Principal | ICD-10-CM

## 2015-04-13 DIAGNOSIS — I5043 Acute on chronic combined systolic (congestive) and diastolic (congestive) heart failure: Secondary | ICD-10-CM | POA: Insufficient documentation

## 2015-04-13 DIAGNOSIS — K922 Gastrointestinal hemorrhage, unspecified: Secondary | ICD-10-CM

## 2015-04-13 DIAGNOSIS — I5033 Acute on chronic diastolic (congestive) heart failure: Secondary | ICD-10-CM

## 2015-04-13 DIAGNOSIS — D62 Acute posthemorrhagic anemia: Secondary | ICD-10-CM

## 2015-04-13 DIAGNOSIS — Z7901 Long term (current) use of anticoagulants: Secondary | ICD-10-CM | POA: Insufficient documentation

## 2015-04-13 LAB — CBC
HCT: 29.4 % — ABNORMAL LOW (ref 39.0–52.0)
HCT: 29.8 % — ABNORMAL LOW (ref 39.0–52.0)
HEMOGLOBIN: 9.1 g/dL — AB (ref 13.0–17.0)
HEMOGLOBIN: 9.2 g/dL — AB (ref 13.0–17.0)
MCH: 26.1 pg (ref 26.0–34.0)
MCH: 26.1 pg (ref 26.0–34.0)
MCHC: 31 g/dL (ref 30.0–36.0)
MCHC: 30.9 g/dL (ref 30.0–36.0)
MCV: 84.2 fL (ref 78.0–100.0)
MCV: 84.7 fL (ref 78.0–100.0)
Platelets: 221 10*3/uL (ref 150–400)
Platelets: 225 10*3/uL (ref 150–400)
RBC: 3.49 MIL/uL — AB (ref 4.22–5.81)
RBC: 3.52 MIL/uL — AB (ref 4.22–5.81)
RDW: 14.2 % (ref 11.5–15.5)
RDW: 14.3 % (ref 11.5–15.5)
WBC: 7.2 10*3/uL (ref 4.0–10.5)
WBC: 6.2 10*3/uL (ref 4.0–10.5)

## 2015-04-13 LAB — TYPE AND SCREEN
ABO/RH(D): O POS
Antibody Screen: NEGATIVE
UNIT DIVISION: 0

## 2015-04-13 LAB — BASIC METABOLIC PANEL
Anion gap: 6 (ref 5–15)
BUN: 27 mg/dL — ABNORMAL HIGH (ref 6–20)
CALCIUM: 8.7 mg/dL — AB (ref 8.9–10.3)
CO2: 23 mmol/L (ref 22–32)
Chloride: 108 mmol/L (ref 101–111)
Creatinine, Ser: 1.81 mg/dL — ABNORMAL HIGH (ref 0.61–1.24)
GFR, EST AFRICAN AMERICAN: 38 mL/min — AB (ref >60)
GFR, EST NON AFRICAN AMERICAN: 32 mL/min — AB (ref >60)
Glucose, Bld: 208 mg/dL — ABNORMAL HIGH (ref 65–99)
POTASSIUM: 4.5 mmol/L (ref 3.5–5.1)
Sodium: 137 mmol/L (ref 135–145)

## 2015-04-13 LAB — GLUCOSE, CAPILLARY
GLUCOSE-CAPILLARY: 275 mg/dL — AB (ref 65–99)
GLUCOSE-CAPILLARY: 34 mg/dL — AB (ref 65–99)
GLUCOSE-CAPILLARY: 34 mg/dL — AB (ref 65–99)
Glucose-Capillary: 147 mg/dL — ABNORMAL HIGH (ref 65–99)
Glucose-Capillary: 232 mg/dL — ABNORMAL HIGH (ref 65–99)
Glucose-Capillary: 76 mg/dL (ref 65–99)

## 2015-04-13 LAB — BRAIN NATRIURETIC PEPTIDE: B Natriuretic Peptide: 247.2 pg/mL — ABNORMAL HIGH (ref 0.0–100.0)

## 2015-04-13 MED ORDER — HYDRALAZINE HCL 20 MG/ML IJ SOLN: 10.0000 mg | Freq: Four times a day (QID) | INTRAMUSCULAR | Status: DC | PRN

## 2015-04-13 MED ORDER — DEXTROSE 50 % IV SOLN
INTRAVENOUS | Status: AC
  Administered 2015-04-13: 50 mL
  Filled 2015-04-13: qty 50

## 2015-04-13 MED ORDER — FUROSEMIDE 10 MG/ML IJ SOLN
40.0000 mg | Freq: Once | INTRAMUSCULAR | Status: AC
  Administered 2015-04-13: 40 mg via INTRAVENOUS
  Filled 2015-04-13: qty 4

## 2015-04-13 MED ORDER — NITROGLYCERIN 2 % TD OINT
0.5000 [in_us] | TOPICAL_OINTMENT | Freq: Four times a day (QID) | TRANSDERMAL | Status: DC
  Administered 2015-04-13 – 2015-04-15 (×8): 0.5 [in_us] via TOPICAL
  Filled 2015-04-13: qty 30

## 2015-04-13 MED ORDER — INSULIN ASPART 100 UNIT/ML ~~LOC~~ SOLN
0.0000 [IU] | Freq: Three times a day (TID) | SUBCUTANEOUS | Status: DC
  Administered 2015-04-13: 3 [IU] via SUBCUTANEOUS
  Administered 2015-04-13 – 2015-04-14 (×2): 5 [IU] via SUBCUTANEOUS
  Administered 2015-04-14: 2 [IU] via SUBCUTANEOUS
  Administered 2015-04-14: 3 [IU] via SUBCUTANEOUS
  Administered 2015-04-15: 5 [IU] via SUBCUTANEOUS
  Administered 2015-04-15: 9 [IU] via SUBCUTANEOUS

## 2015-04-13 MED ORDER — POLYETHYLENE GLYCOL 3350 17 GM/SCOOP PO POWD
0.5000 | Freq: Once | ORAL | Status: AC
  Administered 2015-04-13: 127.5 g via ORAL
  Filled 2015-04-13: qty 255

## 2015-04-13 MED ORDER — POLYETHYLENE GLYCOL 3350 17 GM/SCOOP PO POWD: 0.5000 | Freq: Once | ORAL | Status: DC

## 2015-04-13 MED ORDER — POLYETHYLENE GLYCOL 3350 17 GM/SCOOP PO POWD: 1.0000 | Freq: Once | ORAL | Status: DC

## 2015-04-13 MED ORDER — BISACODYL 5 MG PO TBEC
10.0000 mg | DELAYED_RELEASE_TABLET | Freq: Once | ORAL | Status: AC
  Administered 2015-04-13: 10 mg via ORAL
  Filled 2015-04-13: qty 2

## 2015-04-13 NOTE — Progress Notes (Signed)
Utilization Review Completed.  

## 2015-04-13 NOTE — Progress Notes (Signed)
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 Some orthopnea. Speaking in interrupted sentences. No further melena and abdominal tenderness has resolved.  CURRENT MEDS . sodium chloride   Intravenous Once  . carvedilol  6.25 mg Oral BID WC  . furosemide  40 mg Intravenous Once  . insulin glargine  15 Units Subcutaneous Daily  . [START ON 04/16/2015] pantoprazole (PROTONIX) IV  40 mg Intravenous Q12H  . sodium chloride  3 mL Intravenous Q12H    OBJECTIVE   Intake/Output Summary (Last 24 hours) at 04/13/15 0814 Last data filed at 04/13/15 0700  Gross per 24 hour  Intake 2106.33 ml  Output   1655 ml  Net 451.33 ml   Filed Weights   04/12/15 1537 04/12/15 1845  Weight: 244 lb (110.678 kg) 238 lb 15.7 oz (108.4 kg)    PHYSICAL EXAM Filed Vitals:   04/13/15 0210 04/13/15 0400 04/13/15 0600 04/13/15 0742  BP: 163/61 160/56 159/47 185/78  Pulse: 73 69 70 78  Temp: 97.8 F (36.6 C) 98 F (36.7 C)  97.7 F (36.5 C)  TempSrc: Oral Oral  Oral  Resp: 17 28 28 22   Height:      Weight:      SpO2: 100% 99% 99% 99%   General: Alert, oriented x3, no distress Head: no evidence of trauma, PERRL, EOMI, no exophtalmos or lid lag, no myxedema, no xanthelasma; normal ears, nose and oropharynx Neck: normal jugular venous pulsations and no hepatojugular reflux; brisk carotid pulses without delay and no carotid bruits Chest: a few left basilar rales, no signs of consolidation by percussion or palpation, normal fremitus, symmetrical and full respiratory excursions Cardiovascular:  normal position and quality of the apical impulse, irregular rhythm, normal first and second heart sounds, no rubs or gallops, 1-2/6 early peaking aortic ejection murmur Abdomen: no tenderness or distention, no masses by palpation, no abnormal pulsatility or arterial bruits, normal bowel sounds, no hepatosplenomegaly Extremities: no clubbing, cyanosis or edema; 2+ radial, ulnar and brachial pulses bilaterally; 2+ right femoral, posterior tibial and dorsalis pedis pulses; 2+ left femoral, posterior tibial and dorsalis pedis pulses; no subclavian or femoral bruits Neurological: grossly nonfocal  LABS  CBC  Recent Labs  04/12/15 1946 04/13/15 0337  WBC 6.8 7.2  HGB 8.5* 9.1*  HCT 28.2* 29.4*  MCV 84.7 84.2  PLT 242 852   Basic Metabolic Panel  Recent Labs  04/12/15 1606 04/13/15 0337  NA 137 137  K 4.9 4.5  CL 104 108  CO2 24 23  GLUCOSE 91 208*  BUN 34* 27*  CREATININE 2.06* 1.81*  CALCIUM 9.1 8.7*   Liver Function Tests  Recent Labs  04/12/15 1645  AST 27  ALT 16*  ALKPHOS 94  BILITOT 0.6  PROT 6.4*  ALBUMIN 3.5    Recent Labs  04/12/15 1645  LIPASE 25    Radiology Studies Imaging results have been reviewed and Dg Chest 2 View  04/12/2015  CLINICAL DATA:  Shortness of breath, weakness. EXAM: CHEST  2 VIEW COMPARISON:  06/29/2014 FINDINGS: Prior valve  replacement. Cardiomegaly. No confluent airspace opacities, effusions or edema. Degenerative changes in the thoracic spine. No acute bony abnormality. IMPRESSION: Cardiomegaly.  No active disease. Electronically Signed   By: Rolm Baptise M.D.   On: 04/12/2015 16:42    TELE atrial fibrillation   ASSESSMENT AND PLAN   1. Acute upper GI bleeding. GI has been consulted and PPI infusion started, but endoscopy will likely be delayed due to active anticoagulation (took his last dose of Eliquis this morning). 2. Permanent atrial fibrillation with high embolic risk/history of previous stroke. CHADSVasc score 7 (age  62, CVA 2, DM, HTN, CAD). He will have to interrupt his anticoagulation temporarily, but we need to identify a long-term solution since recurrent GI bleeding is definitely likely. I wonder whether he will be well suited for a watchman device, since he will likely be able to tolerate brief anticoagulation needed after implantation. Dr. Burt Knack will review. 3. CAD s/p CABG - no angina despite anemia 4. S/p bioprosthetic AVR -normal function by exam. I think his symptoms can be explained by anemia, rather than aortic stenosis, although the symptoms are obviously similar 5. Systemic hypertension, BP remains higher than usual today 6. Type 2 diabetes mellitus requiring insulin 7. Advanced chronic kidney disease (stage 3-4). GFR at baseline of around 30. Creatinine better than usual today 8. Intraventricular conduction delay, atypical left bundle branch block and reported history of second-degree AV block. Rate control is optimal. Avoid higher doses of beta blocker 9. Acute on chronic diastolic heart failure - >7W net positive since admission, including one unit PRBC. Will give one dose of IV diuretic. Low dose nitrates for vasodilator effect.  Sanda Klein, MD, [facility] [phone] office [phone] pager 04/13/2015 8:14 AM

## 2015-04-13 NOTE — Progress Notes (Signed)
Good urine output, but breathing still a little labored and orthopnea persists. Will give another dose of IV diuretic.

## 2015-04-13 NOTE — Progress Notes (Signed)
Daily Rounding Note  04/13/2015, 8:25 AM  LOS: 1 day   SUBJECTIVE:       No BMs today.  No abd pain or n/v.  No nausea.  3 stools yesterday, one formed and somewhat dark, 2 smaller and black.  Breathing is better, feels well.   OBJECTIVE:         Vital signs in last 24 hours:    Temp:  [97.7 F (36.5 C)-98.1 F (36.7 C)] 97.7 F (36.5 C) (11/03 0742) Pulse Rate:  [52-98] 78 (11/03 0742) Resp:  [14-31] 22 (11/03 0742) BP: (100-209)/(47-98) 185/78 mmHg (11/03 0742) SpO2:  [98 %-100 %] 99 % (11/03 0742) Weight:  [238 lb 15.7 oz (108.4 kg)-244 lb (110.678 kg)] 238 lb 15.7 oz (108.4 kg) (11/02 1845) Last BM Date: 04/12/15 Filed Weights   04/12/15 1537 04/12/15 1845  Weight: 244 lb (110.678 kg) 238 lb 15.7 oz (108.4 kg)   General: pleasant, comfortable/NAD.  Looks well but slightly frail.    Heart: irreg in 80s.  Chest: clear bil but diminished BS throughout.   Abdomen: soft, active BS, soft.    Extremities: some deformity of feet but no edema.   Neuro/Psych:  Pleasant, alert/oriented x 3.  Moves all 4s, strength not tested  Intake/Output from previous day: 11/02 0701 - 11/03 0700 In: 2106.3 [P.O.:938; I.V.:833.3; Blood:335] Out: 3244 [Urine:1655]  Intake/Output this shift:    Lab Results:  Recent Labs  04/12/15 1606 04/12/15 1946 04/13/15 0337  WBC 5.6 6.8 7.2  HGB 8.2* 8.5* 9.1*  HCT 26.7* 28.2* 29.4*  PLT 229 242 221   BMET  Recent Labs  04/12/15 1606 04/13/15 0337  NA 137 137  K 4.9 4.5  CL 104 108  CO2 24 23  GLUCOSE 91 208*  BUN 34* 27*  CREATININE 2.06* 1.81*  CALCIUM 9.1 8.7*   LFT  Recent Labs  04/12/15 1645  PROT 6.4*  ALBUMIN 3.5  AST 27  ALT 16*  ALKPHOS 94  BILITOT 0.6  BILIDIR 0.1  IBILI 0.5   PT/INR  Recent Labs  04/12/15 1645  LABPROT 15.4*  INR 1.20   Hepatitis Panel No results for input(s): HEPBSAG, HCVAB, HEPAIGM, HEPBIGM in the last 72  hours.  Studies/Results: Dg Chest 2 View  04/12/2015  CLINICAL DATA:  Shortness of breath, weakness. EXAM: CHEST  2 VIEW COMPARISON:  06/29/2014 FINDINGS: Prior valve replacement. Cardiomegaly. No confluent airspace opacities, effusions or edema. Degenerative changes in the thoracic spine. No acute bony abnormality. IMPRESSION: Cardiomegaly.  No active disease. Electronically Signed   By: Rolm Baptise M.D.   On: 04/12/2015 16:42    ASSESMENT:   *  Melena, likely UGIB.  Hx intestinal AVMs, adenomatous polyps 01/2011,and remote PUD.  Daily PPI PTA, now on PPI gtt.     *  Blood loss anemia.  On po Iron PTA.  Per office note 8/15 on weekly Procrit for 2 years but d/c'd in 06/2014 after his CVA. S/p 1 PRBC 11/3.  Venofer on 10/21/14.  Transfused 1 PRBC 8/12. Has a hematologist Dr Ma Hillock.   *  Chronic Eliquis, for a fib.  On hold.  Also hx bio AVR.   *   IDDM  *  Cholelithiasis per CT without contrast 10/27 (for eval of renal cysts).  Normal LFTs and Lipase.   *  Rheum  Arthritis.   *  AKI.  Improved.     PLAN   *  Enteroscopy  at 10AM tomorrow.  Clears today.  Continue ppi, CBC BID.      [name]  04/13/2015, 8:25 AM Pager: [phone]

## 2015-04-13 NOTE — Progress Notes (Signed)
Inpatient Diabetes Program Recommendations  AACE/ADA: New Consensus Statement on Inpatient Glycemic Control (2015)  Target Ranges:  Prepandial:   less than 140 mg/dL      Peak postprandial:   less than 180 mg/dL (1-2 hours)      Critically ill patients:  140 - 180 mg/dL  Results for [name], [name] (MRN [id number]) as of 04/13/2015 10:36  Ref. Range 04/12/2015 17:58 04/12/2015 18:21 04/12/2015 22:10 04/13/2015 05:47  Glucose-Capillary Latest Ref Range: 65-99 mg/dL 46 (L) 131 (H) 220 (H) 147 (H)   Review of Glycemic Control  Diabetes history: DM2 Outpatient Diabetes medications: Lantus 50 units daily, Novolog 0-7 units QID Current orders for Inpatient glycemic control: Lantus 15 units daily  Inpatient Diabetes Program Recommendations: Correction (SSI): Please consider ordering CBGs with Novolog sensitive correction scale Q4H while NPO and change to ACHS once diet is resumed.  Note: Initial finger stick glucose was 46 mg/dl on 11/2 at 17:58. According to the home medication list, patient took Lantus 50 units yesterday morning prior to coming to the hospital. Fasting glucose is 147 mg/dl this morning.   Thanks, Barnie Alderman, RN, MSN, CDE Diabetes Coordinator Inpatient Diabetes Program [phone] (Team Pager from [place] to [place]) [phone] ([facility] office) [phone] [facility] office) [phone] [facility] office)

## 2015-04-13 NOTE — Progress Notes (Signed)
CRITICAL VALUE ALERT  Critical value received:  CBG 34  Date of notification:  04/13/2015  Time of notification:  2320  Critical value read back:Yes.    Nurse who received alert:  Annice Pih, RN   MD notified (1st page):  Chaney Malling, NP  Time of first page:  2321  MD notified (2nd page):  Time of second page:  Responding MD:    Time MD responded:

## 2015-04-13 NOTE — Progress Notes (Signed)
PATIENT DETAILS [name] Age: 81 y.o. Sex: male Date of Birth: 11-Aug-1927 Admit Date: 04/12/2015 Admitting Physician Geradine Girt, DO IRW:ERXVQ, Aris Everts, MD  Subjective: No further melena or hematochezia. Appears slightly tachypneic.  Assessment/Plan: Active Problems: Suspected upper GI bleed: No further bleeding since admission. GI consulted-tentative plans are for endoscopy on 11/4. Continue PPI  Acute blood loss anemia: Secondary to above, has anemia from chronic kidney disease at baseline. Transfused 1 unit of PRBC. Hemoglobin remains stable. Follow CBCs.  Acute on chronic diastolic heart failure: Suspect secondary to IV fluids, PRBC transfusion. Decrease IV fluids to KVO, one dose of Lasix today. Daily weights, strict intake output  Chronic kidney disease stage III: Creatinine close to her usual baseline. Follow and monitor closely.  Hypoglycemia: Resolved, since on clear liquids-continue Lantus 15 units, add low-dose SSI. Follow. Discontinue IV fluids.  Chronic atrial fibrillation:CHADSVasc score 7-rate currently controlled with Coreg-Eliquis on hold due to GI bleeding.  Hypertension: Uncontrolled blood pressure this morning-stop IV fluids, 1 dose of Lasix-and reassess. Continue Coreg.  History of CAD status post CABG: No chest pain-despite of acute blood given GI bleeding. Continue Coreg  S/P AVR with bioprosthetic valve: Suspect shortness of breath is likely secondary to anemia and acute diastolic heart failure. Monitor for now.  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  Disposition: Remain inpatient-remain SDU  Antimicrobial agents  See below  Anti-infectives    None      DVT Prophylaxis:  SCD's  Code Status: Full code   Family  Communication None  Procedures: None  CONSULTS:  GI  Time spent 40 minutes-Greater than 50% of this time was spent in counseling, explanation of diagnosis, planning of further management, and coordination of care.  MEDICATIONS: Scheduled Meds: . sodium chloride   Intravenous Once  . carvedilol  6.25 mg Oral BID WC  . insulin glargine  15 Units Subcutaneous Daily  . nitroGLYCERIN  0.5 inch Topical Q6H  . [START ON 04/16/2015] pantoprazole (PROTONIX) IV  40 mg Intravenous Q12H  . sodium chloride  3 mL Intravenous Q12H   Continuous Infusions: . dextrose 5 % and 0.9% NaCl 50 mL/hr at 04/12/15 2050  . pantoprozole (PROTONIX) infusion 8 mg/hr (04/13/15 0826)   PRN Meds:.hydrALAZINE, ondansetron **OR** ondansetron (ZOFRAN) IV    PHYSICAL EXAM: Vital signs in last 24 hours: Filed Vitals:   04/13/15 0210 04/13/15 0400 04/13/15 0600 04/13/15 0742  BP: 163/61 160/56 159/47 185/78  Pulse: 73 69 70 78  Temp: 97.8 F (36.6 C) 98 F (36.7 C)  97.7 F (36.5 C)  TempSrc: Oral Oral  Oral  Resp: 17 28 28 22   Height:      Weight:      SpO2: 100% 99% 99% 99%    Weight change:  Filed Weights   04/12/15 1537 04/12/15 1845  Weight: 110.678 kg (244 lb) 108.4 kg (238 lb 15.7 oz)   Body mass index is 34.29 kg/(m^2).   Gen Exam: Awake and alert with clear speech.  Neck: Supple, No JVD.   Chest: Few bibasilar rales CVS: S1 S2 irregular, systolic murmur Abdomen: soft, BS +, non tender, non distended.  Extremities: no edema, lower extremities warm to touch. Neurologic: Non Focal.  Skin: No Rash.   Wounds: N/A.   Intake/Output from previous day:  Intake/Output Summary (Last 24 hours) at 04/13/15 1133 Last data filed at 04/13/15 0800  Gross per 24 hour  Intake 2166.33 ml  Output   1655 ml  Net 511.33 ml     LAB RESULTS: CBC  Recent Labs Lab 04/12/15 1606 04/12/15 1946 04/13/15 0337  WBC 5.6 6.8 7.2  HGB 8.2* 8.5* 9.1*  HCT 26.7* 28.2* 29.4*  PLT 229 242 221  MCV  84.0 84.7 84.2  MCH 25.8* 25.5* 26.1  MCHC 30.7 30.1 31.0  RDW 14.3 14.4 14.2    Chemistries   Recent Labs Lab 04/06/15 1342 04/12/15 1606 04/13/15 0337  NA  --  137 137  K  --  4.9 4.5  CL  --  104 108  CO2  --  24 23  GLUCOSE  --  91 208*  BUN  --  34* 27*  CREATININE 2.00* 2.06* 1.81*  CALCIUM  --  9.1 8.7*    CBG:  Recent Labs Lab 04/12/15 1758 04/12/15 1821 04/12/15 2210 04/13/15 0547  GLUCAP 46* 131* 220* 147*    GFR Estimated Creatinine Clearance: 36.8 mL/min (by C-G formula based on Cr of 1.81).  Coagulation profile  Recent Labs Lab 04/12/15 1645  INR 1.20    Cardiac Enzymes No results for input(s): CKMB, TROPONINI, MYOGLOBIN in the last 168 hours.  Invalid input(s): CK  Invalid input(s): POCBNP No results for input(s): DDIMER in the last 72 hours. No results for input(s): HGBA1C in the last 72 hours. No results for input(s): CHOL, HDL, LDLCALC, TRIG, CHOLHDL, LDLDIRECT in the last 72 hours. No results for input(s): TSH, T4TOTAL, T3FREE, THYROIDAB in the last 72 hours.  Invalid input(s): FREET3 No results for input(s): VITAMINB12, FOLATE, FERRITIN, TIBC, IRON, RETICCTPCT in the last 72 hours.  Recent Labs  04/12/15 1645  LIPASE 25    Urine Studies No results for input(s): UHGB, CRYS in the last 72 hours.  Invalid input(s): UACOL, UAPR, USPG, UPH, UTP, UGL, UKET, UBIL, UNIT, UROB, ULEU, UEPI, UWBC, URBC, UBAC, CAST, UCOM, BILUA  MICROBIOLOGY: Recent Results (from the past 240 hour(s))  MRSA PCR Screening     Status: None   Collection Time: 04/12/15  7:22 PM  Result Value Ref Range Status   MRSA by PCR NEGATIVE NEGATIVE Final    Comment:        The GeneXpert MRSA Assay (FDA approved for NASAL specimens only), is one component of a comprehensive MRSA colonization surveillance program. It is not intended to diagnose MRSA infection nor to guide or monitor treatment for MRSA infections.     RADIOLOGY STUDIES/RESULTS: Ct  Abdomen Wo Contrast  04/06/2015  CLINICAL DATA:  Indeterminate exophytic renal lesion in the right kidney detected on recent sonogram. EXAM: CT ABDOMEN WITHOUT CONTRAST TECHNIQUE: Multidetector CT imaging of the abdomen was performed following the standard protocol without IV contrast. COMPARISON:  03/15/2015 retroperitoneal sonogram. 01/09/2008 CT abdomen. FINDINGS: Lower chest: No significant pulmonary nodules or acute consolidative airspace disease. Hepatobiliary: Normal liver with no liver mass. There are layering tiny subcentimeter calcified gallstones in the nondistended gallbladder, with no gallbladder wall thickening or pericholecystic fat stranding. No biliary ductal dilatation. Pancreas: Normal, with no mass or duct dilation. Spleen: Normal size spleen. No splenic mass. Tiny vascular versus granulomatous calcifications in the medial spleen. Adrenals/Urinary Tract: Normal adrenals. There is mild fullness of the left renal collecting system without overt left hydronephrosis. No renal stones. There is an exophytic 1.3 cm lesion in the lateral interpolar right kidney with complex fluid  density, which measured 1.5 cm on 01/09/2008, and is slightly decreased in size, in keeping with a benign mildly hemorrhagic/ proteinaceous Bosniak category 2 right renal cyst. No new contour deforming right renal lesions. There is a minimally complex 3.8 cm partially exophytic renal cyst in the posterior upper left kidney, which demonstrates thin curvilinear mural calcification, which measured 5.1 cm on 01/09/2008 and is decreased in size, in keeping with a benign Bosniak category 2 left renal cyst. There is a simple 1.3 cm renal cyst in the posterior interpolar left kidney. No new contour deforming left renal lesions. Stomach/Bowel: Grossly normal stomach. Visualized small and large bowel is normal caliber, with no bowel wall thickening. Vascular/Lymphatic: Atherosclerotic nonaneurysmal abdominal aorta. No pathologically  enlarged lymph nodes in the abdomen. Other: No pneumoperitoneum, ascites or focal fluid collection. Musculoskeletal: No aggressive appearing focal osseous lesions. Marked degenerative changes in the visualized thoracolumbar spine. IMPRESSION: 1. Benign Bosniak category 2 renal cysts in both kidneys, which are decreased in size since 2009. No suspicious renal lesions on this noncontrast study. 2. Mild fullness of the left renal collecting system without overt left hydronephrosis. No nephrolithiasis in the visualized portions of the urinary tracts. 3. Cholelithiasis, with no evidence of acute cholecystitis. No biliary ductal dilatation. Electronically Signed   By: Ilona Sorrel M.D.   On: 04/06/2015 14:55   Dg Chest 2 View  04/12/2015  CLINICAL DATA:  Shortness of breath, weakness. EXAM: CHEST  2 VIEW COMPARISON:  06/29/2014 FINDINGS: Prior valve replacement. Cardiomegaly. No confluent airspace opacities, effusions or edema. Degenerative changes in the thoracic spine. No acute bony abnormality. IMPRESSION: Cardiomegaly.  No active disease. Electronically Signed   By: Rolm Baptise M.D.   On: 04/12/2015 16:42   US Renal  03/15/2015  CLINICAL DATA:  Chronic kidney disease stage 3 EXAM: RENAL / URINARY TRACT ULTRASOUND COMPLETE COMPARISON:  Ultrasound of the kidneys of 01/30/2012 FINDINGS: Right Kidney: Length: 13.3 cm. No hydronephrosis is seen. In the midpole there is a hypoechoic structure with some echoes measuring 1.4 cm most likely representing a slightly complex cyst. However this area was not seen on the prior ultrasound and if CT cannot be performed, MR would be recommended to exclude a possible neoplasm. No definite blood flow is seen in that region. Left Kidney: Length: 12.7 cm. Septated complex cyst is noted in the upper pole of 4.2 cm compared to 4.9 cm previously. Also in smaller cyst in the midpole measures 1.4 cm compared to 1.4 cm previously. Bladder: The urinary bladder is difficult to evaluate,  with probable debris layering dependently posteriorly. No definite soft tissue mass is seen. IMPRESSION: 1. No hydronephrosis. 2. New exophytic complex structure emanating from the mid lateral right kidney of 1.4 cm. Possible complex cyst but consider CT or MRI to assess further. Electronically Signed   By: Ivar Drape M.D.   On: 03/15/2015 14:16    Oren Binet, MD  [facility] Pager:[phone]  If 7PM-7AM, please contact night-coverage [email or web address] Password [email or web address] 04/13/2015, 11:33 AM   LOS: 1 day

## 2015-04-14 ENCOUNTER — Inpatient Hospital Stay (HOSPITAL_COMMUNITY): Payer: Medicare Other

## 2015-04-14 ENCOUNTER — Encounter (HOSPITAL_COMMUNITY): Payer: Self-pay | Admitting: *Deleted

## 2015-04-14 ENCOUNTER — Encounter (HOSPITAL_COMMUNITY)
Admission: EM | Disposition: A | Discharge status: Skilled Nursing Facility | Payer: Self-pay | Source: Home / Self Care | Attending: Internal Medicine

## 2015-04-14 ENCOUNTER — Inpatient Hospital Stay (HOSPITAL_COMMUNITY): Payer: Medicare Other | Admitting: Anesthesiology

## 2015-04-14 DIAGNOSIS — I5041 Acute combined systolic (congestive) and diastolic (congestive) heart failure: Secondary | ICD-10-CM

## 2015-04-14 DIAGNOSIS — D649 Anemia, unspecified: Secondary | ICD-10-CM | POA: Insufficient documentation

## 2015-04-14 DIAGNOSIS — K31819 Angiodysplasia of stomach and duodenum without bleeding: Secondary | ICD-10-CM | POA: Insufficient documentation

## 2015-04-14 DIAGNOSIS — R0602 Shortness of breath: Secondary | ICD-10-CM

## 2015-04-14 HISTORY — PX: ENTEROSCOPY: SHX5533

## 2015-04-14 LAB — GLUCOSE, CAPILLARY
GLUCOSE-CAPILLARY: 111 mg/dL — AB (ref 65–99)
GLUCOSE-CAPILLARY: 252 mg/dL — AB (ref 65–99)
GLUCOSE-CAPILLARY: 375 mg/dL — AB (ref 65–99)
Glucose-Capillary: 167 mg/dL — ABNORMAL HIGH (ref 65–99)
Glucose-Capillary: 193 mg/dL — ABNORMAL HIGH (ref 65–99)
Glucose-Capillary: 205 mg/dL — ABNORMAL HIGH (ref 65–99)
Glucose-Capillary: 296 mg/dL — ABNORMAL HIGH (ref 65–99)
Glucose-Capillary: 332 mg/dL — ABNORMAL HIGH (ref 65–99)

## 2015-04-14 LAB — CBC
HCT: 28.2 % — ABNORMAL LOW (ref 39.0–52.0)
HEMOGLOBIN: 8.9 g/dL — AB (ref 13.0–17.0)
MCH: 26.7 pg (ref 26.0–34.0)
MCHC: 31.6 g/dL (ref 30.0–36.0)
MCV: 84.7 fL (ref 78.0–100.0)
PLATELETS: 198 10*3/uL (ref 150–400)
RBC: 3.33 MIL/uL — AB (ref 4.22–5.81)
RDW: 14.3 % (ref 11.5–15.5)
WBC: 7.5 10*3/uL (ref 4.0–10.5)

## 2015-04-14 LAB — BASIC METABOLIC PANEL
ANION GAP: 14 (ref 5–15)
BUN: 24 mg/dL — ABNORMAL HIGH (ref 6–20)
CHLORIDE: 101 mmol/L (ref 101–111)
CO2: 21 mmol/L — AB (ref 22–32)
CREATININE: 1.92 mg/dL — AB (ref 0.61–1.24)
Calcium: 8.9 mg/dL (ref 8.9–10.3)
GFR calc non Af Amer: 30 mL/min — ABNORMAL LOW (ref >60)
GFR, EST AFRICAN AMERICAN: 35 mL/min — AB (ref >60)
Glucose, Bld: 207 mg/dL — ABNORMAL HIGH (ref 65–99)
POTASSIUM: 4.7 mmol/L (ref 3.5–5.1)
SODIUM: 136 mmol/L (ref 135–145)

## 2015-04-14 LAB — FERRITIN: FERRITIN: 34 ng/mL (ref 24–336)

## 2015-04-14 LAB — IRON AND TIBC
IRON: 12 ug/dL — AB (ref 45–182)
SATURATION RATIOS: 4 % — AB (ref 17.9–39.5)
TIBC: 339 ug/dL (ref 250–450)
UIBC: 327 ug/dL

## 2015-04-14 SURGERY — ENTEROSCOPY
Anesthesia: Monitor Anesthesia Care

## 2015-04-14 MED ORDER — AMLODIPINE BESYLATE 5 MG PO TABS
2.5000 mg | ORAL_TABLET | Freq: Every day | ORAL | Status: DC
  Administered 2015-04-15: 2.5 mg via ORAL
  Filled 2015-04-14 (×2): qty 1

## 2015-04-14 MED ORDER — GLUCAGON HCL RDNA (DIAGNOSTIC) 1 MG IJ SOLR
INTRAMUSCULAR | Status: AC
  Filled 2015-04-14: qty 2

## 2015-04-14 MED ORDER — FUROSEMIDE 40 MG PO TABS
40.0000 mg | ORAL_TABLET | Freq: Every day | ORAL | Status: DC
  Administered 2015-04-14 – 2015-04-15 (×2): 40 mg via ORAL
  Filled 2015-04-14 (×2): qty 1

## 2015-04-14 MED ORDER — INSULIN ASPART 100 UNIT/ML ~~LOC~~ SOLN
8.0000 [IU] | Freq: Once | SUBCUTANEOUS | Status: AC
  Administered 2015-04-14: 8 [IU] via SUBCUTANEOUS

## 2015-04-14 MED ORDER — LACTATED RINGERS IV SOLN
INTRAVENOUS | Status: DC
  Administered 2015-04-14: 10:00:00 via INTRAVENOUS

## 2015-04-14 MED ORDER — PANTOPRAZOLE SODIUM 40 MG PO TBEC
40.0000 mg | DELAYED_RELEASE_TABLET | Freq: Every day | ORAL | Status: DC
  Administered 2015-04-14 – 2015-04-17 (×4): 40 mg via ORAL
  Filled 2015-04-14 (×4): qty 1

## 2015-04-14 MED ORDER — PROPOFOL 500 MG/50ML IV EMUL
INTRAVENOUS | Status: DC | PRN
  Administered 2015-04-14: 50 ug/kg/min via INTRAVENOUS

## 2015-04-14 MED ORDER — SODIUM CHLORIDE 0.9 % IV SOLN
INTRAVENOUS | Status: DC
Start: 2015-04-14 — End: 2015-04-14

## 2015-04-14 NOTE — Progress Notes (Signed)
SBE completed, see report I spoke to the patient and his son, [name].   After this discussion, decision made not to pursue colonoscopy at this time.  Will plan to check iron studies and if low dose with IV iron before discharge PT/OT eval.  Pt lives at home and may need rehab stay after discharge Monitor Hgb.  If recurrent melena then next diagnostic step would be video capsule study (additional small bowel angiodysplastic lesions certainly possible) versus colonoscopy. Daily PPI GI will be available, please call with questions

## 2015-04-14 NOTE — Progress Notes (Signed)
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 Sleeping virtually flat in bed, appears less dyspneic. Good AF rate control, BP persistently high. Took only 1/2 of his colon prep. EGD today, colonoscopy deferred. Hemoglobin unchanged. Good UO, creat remains at or better than baseline.  CURRENT MEDS . sodium chloride   Intravenous Once  . amLODipine  2.5 mg Oral Daily  . carvedilol  6.25 mg Oral BID WC  . insulin aspart  0-9 Units Subcutaneous TID WC  . insulin glargine  15 Units Subcutaneous Daily  . nitroGLYCERIN  0.5 inch Topical Q6H  . [START ON 04/16/2015] pantoprazole (PROTONIX) IV  40 mg Intravenous Q12H  . polyethylene glycol powder  0.5 Container Oral Once  . sodium chloride  3 mL Intravenous Q12H    OBJECTIVE   Intake/Output Summary (Last 24 hours) at 04/14/15 0805 Last data filed at 04/14/15 0700  Gross per 24 hour  Intake 2637.33 ml  Output   4725 ml  Net -2087.67 ml   Filed Weights   04/12/15 1537 04/12/15 1845  Weight: 244 lb (110.678 kg) 238 lb 15.7 oz (108.4 kg)    PHYSICAL EXAM Filed Vitals:   04/13/15 1200 04/13/15 1952 04/14/15 0000 04/14/15 0436  BP: 151/54 149/64 138/78 154/84  Pulse: 75 103 70 94  Temp: 97.8 F (36.6 C) 98 F (36.7 C) 97.6 F (36.4 C) 97.9 F (36.6 C)  TempSrc: Oral Oral Oral Oral  Resp: 24 17 27 21   Height:      Weight:      SpO2: 100% 100% 100% 97%   General: Alert, oriented x3, no distress, pale Head: no evidence of trauma, PERRL,  EOMI, no exophtalmos or lid lag, no myxedema, no xanthelasma; normal ears, nose and oropharynx Neck: normal jugular venous pulsations and no hepatojugular reflux; brisk carotid pulses without delay and no carotid bruits Chest: clear to auscultation, no signs of consolidation by percussion or palpation, normal fremitus, symmetrical and full respiratory excursions Cardiovascular: normal position and quality of the apical impulse, irregular rhythm, normal first and second heart sounds, no rubs or gallops, 2/6 Ao ejection early peaking murmur Abdomen: no tenderness or distention, no masses by palpation, no abnormal pulsatility or arterial bruits, normal bowel sounds, no hepatosplenomegaly Extremities: no clubbing, cyanosis or edema; 2+ radial, ulnar and brachial pulses bilaterally; 2+ right femoral, posterior tibial and dorsalis pedis pulses; 2+ left femoral, posterior tibial and dorsalis pedis pulses; no subclavian or femoral bruits Neurological: grossly nonfocal  LABS  CBC  Recent Labs  04/13/15 1815 04/14/15 0723  WBC 6.2 7.5  HGB 9.2* 8.9*  HCT 29.8* 28.2*  MCV 84.7 84.7  PLT 225 361   Basic Metabolic Panel  Recent Labs  04/13/15 0337 04/14/15 0432  NA 137 136  K 4.5 4.7  CL 108 101  CO2 23 21*  GLUCOSE 208* 207*  BUN 27* 24*  CREATININE 1.81* 1.92*  CALCIUM 8.7* 8.9   Liver Function Tests  Recent Labs  04/12/15 1645  AST 27  ALT 16*  ALKPHOS 94  BILITOT 0.6  PROT 6.4*  ALBUMIN 3.5    Recent Labs  04/12/15 1645  LIPASE 25    Radiology Studies Imaging results have been reviewed and Dg Chest 2 View  04/12/2015  CLINICAL DATA:  Shortness of breath, weakness. EXAM: CHEST  2 VIEW COMPARISON:  06/29/2014 FINDINGS: Prior valve replacement. Cardiomegaly. No confluent airspace opacities, effusions or edema. Degenerative changes in the thoracic spine. No acute bony abnormality. IMPRESSION: Cardiomegaly.  No active disease. Electronically Signed   By: Rolm Baptise M.D.    On: 04/12/2015 16:42   Dg Chest Port 1 View  04/14/2015  CLINICAL DATA:  Shortness of breath. EXAM: PORTABLE CHEST 1 VIEW COMPARISON:  04/12/2015. FINDINGS: Prior cardiac valve replacement. Cardiomegaly. Mild pulmonary vascular prominence interstitial prominence consistent congestive heart failure. Small left pleural effusion. No pneumothorax . IMPRESSION: Prior cardiac valve replacement. Cardiomegaly with pulmonary vascular prominence and interstitial prominence and small left pleural effusion consistent with congestive heart failure. Pulmonary interstitial edema is new from 04/12/2015 . Electronically Signed   By: Marcello Moores  Register   On: 04/14/2015 07:16    TELE AFib, 60-90s   ASSESSMENT AND PLAN  1. Acute upper GI bleeding. Seems to have stopped. 48h since last Eliquis dose. EGD today. 2. Permanent atrial fibrillation with high embolic risk/history of previous stroke. CHADSVasc score 7 (age 81, CVA 2, DM, HTN, CAD). He will have to interrupt his anticoagulation temporarily, but we need to identify a long-term solution since recurrent GI bleeding is definitely likely. I wonder whether he will be well suited for a watchman device, since he will likely be able to tolerate brief anticoagulation needed after implantation. Dr. Burt Knack will review. 3. CAD s/p CABG - no angina despite anemia 4. S/p bioprosthetic AVR -normal function by exam. I think his symptoms can be explained by anemia, rather than aortic stenosis, although the symptoms are obviously similar 5. Systemic hypertension, BP remains higher than usual today. Add amlodipine 2.5 mg daily 6. Type 2 diabetes mellitus requiring insulin. Had transient hypoglycemia while NPO, resolved 7. Advanced chronic kidney disease (stage 3-4). GFR baseline of around 30. Creatinine better than usual today 8. Intraventricular conduction delay, atypical left bundle branch block and reported history of second-degree AV block. Rate control is optimal. Avoid higher  doses of beta blocker 9. Acute on chronic diastolic heart failure - seems close to euvolemia, switch to PO diuretic. Low dose nitrates for vasodilator effect.   Sanda Klein, MD, [facility] [phone] office [phone] pager 04/14/2015 8:05 AM

## 2015-04-14 NOTE — Care Management Important Message (Signed)
Important Message  Patient Details  [name] MRN: [id number] Date of Birth: [birth year]   Medicare Important Message Given:  Yes-second notification given    Nathen May 04/14/2015, 10:23 AM

## 2015-04-14 NOTE — Anesthesia Postprocedure Evaluation (Signed)
  Anesthesia Post-op Note  Patient: [name]  Procedure(s) Performed: Procedure(s): ENTEROSCOPY (N/A)  Patient Location: PACU  Anesthesia Type:MAC  Level of Consciousness: awake and alert   Airway and Oxygen Therapy: Patient Spontanous Breathing  Post-op Pain: none  Post-op Assessment: Post-op Vital signs reviewed LLE Motor Response: Purposeful movement, Responds to commands   RLE Motor Response: Purposeful movement, Responds to commands        Post-op Vital Signs: Reviewed  Last Vitals:  Filed Vitals:   04/14/15 1140  BP: 118/58  Pulse: 61  Temp:   Resp: 20    Complications: No apparent anesthesia complications

## 2015-04-14 NOTE — Transfer of Care (Signed)
Immediate Anesthesia Transfer of Care Note  Patient: [name]  Procedure(s) Performed: Procedure(s): ENTEROSCOPY (N/A)  Patient Location: Endoscopy Unit  Anesthesia Type:MAC  Level of Consciousness: awake  Airway & Oxygen Therapy: Patient Spontanous Breathing and Patient connected to nasal cannula oxygen  Post-op Assessment: Report given to RN and Post -op Vital signs reviewed and stable  Post vital signs: Reviewed and stable  Last Vitals:  Filed Vitals:   04/14/15 0931  BP: 179/65  Pulse: 67  Temp: 36.7 C  Resp: 18    Complications: No apparent anesthesia complications

## 2015-04-14 NOTE — Anesthesia Preprocedure Evaluation (Addendum)
Anesthesia Evaluation  Patient identified by MRN, date of birth, ID band Patient awake    Reviewed: Allergy & Precautions, NPO status , Patient's Chart, lab work & pertinent test results  Airway Mallampati: II  TM Distance: >3 FB Neck ROM: Full    Dental  (+) Upper Dentures, Lower Dentures, Dental Advisory Given Removed:   Pulmonary shortness of breath, former smoker,    breath sounds clear to auscultation       Cardiovascular hypertension, + CAD, + CABG and +CHF  + dysrhythmias Atrial Fibrillation  Rhythm:Irregular Rate:Normal  S/p AVR. NL EF by 2014 echo.   Neuro/Psych  Neuromuscular disease    GI/Hepatic negative GI ROS, Neg liver ROS,   Endo/Other  diabetes, Type 2, Insulin Dependent  Renal/GU CRFRenal disease     Musculoskeletal  (+) Arthritis ,   Abdominal   Peds  Hematology  (+) anemia ,   Anesthesia Other Findings   Reproductive/Obstetrics                           Lab Results  Component Value Date   WBC 7.5 04/14/2015   HGB 8.9* 04/14/2015   HCT 28.2* 04/14/2015   MCV 84.7 04/14/2015   PLT 198 04/14/2015   Lab Results  Component Value Date   CREATININE 1.92* 04/14/2015   BUN 24* 04/14/2015   NA 136 04/14/2015   K 4.7 04/14/2015   CL 101 04/14/2015   CO2 21* 04/14/2015    Anesthesia Physical Anesthesia Plan  ASA: III  Anesthesia Plan: MAC   Post-op Pain Management:    Induction: Intravenous  Airway Management Planned: Natural Airway and Nasal Cannula  Additional Equipment:   Intra-op Plan:   Post-operative Plan:   Informed Consent: I have reviewed the patients History and Physical, chart, labs and discussed the procedure including the risks, benefits and alternatives for the proposed anesthesia with the patient or authorized representative who has indicated his/her understanding and acceptance.     Plan Discussed with: CRNA  Anesthesia Plan Comments:          Anesthesia Quick Evaluation

## 2015-04-14 NOTE — Op Note (Signed)
[facility] [place], [id number]   ENTEROSCOPY PROCEDURE REPORT     EXAM DATE: 04/14/2015  PATIENT NAME:      [name], [name]           MR #:      [id number]  BIRTHDATE:       01-Jan-1928      VISIT #:     [id number]  ATTENDING:     Jerene Bears, MD     STATUS:     outpatient ASSISTANT:      Jiles Harold and Lynne Logan MD: [facility] ASA CLASS:        Class III  INDICATIONS:  The patient is a 81 yr old male here for an enteroscopy procedure due to melena, acute on chronic anemia. PROCEDURE PERFORMED:     Small bowel enteroscopy with ablation therapy  MEDICATIONS:     Monitored anesthesia care and Per Anesthesia  CONSENT: The patient understands the risks and benefits of the procedure and understands that these risks include, but are not limited to: sedation, allergic reaction, infection, perforation and/or bleeding. Alternative means of evaluation and treatment include, among others: physical exam, x-rays, and/or surgical intervention. The patient elects to proceed with this endoscopic procedure.  DESCRIPTION OF PROCEDURE: During intra-op preparation period all mechanical & medical equipment was checked for proper function. Hand hygiene and appropriate measures for infection prevention was taken. After the risks, benefits and alternatives of the procedure were thoroughly explained, Informed consent was verified, confirmed and timeout was successfully executed by the treatment team. The Pentax ultraslim colonoscope was introduced through the mouth and advanced to the proximal jejunum jejunum. The prep was The overall prep quality was good.. The instrument was then slowly withdrawn while examining the mucosa circumferentially. The scope was then completely withdrawn from the patient and the procedure terminated. The pulse, BP, and O2 saturation were monitored and documented by the physician and the  nursing staff throughout the entire procedure.  The patient was cared for as planned according to standard protocol, then discharged to recovery in stable condition and with appropriate post procedure care. Estimated blood loss is zero unless otherwise noted in this procedure report.  ESOPHAGUS: The mucosa of the esophagus appeared normal.   Z-line regular at 44 cm.  STOMACH: A small angiodysplastic lesion was found on the greater curvature of the gastric body.  Argon plasma coagulation was applied to the site with good treatment effect.   The stomach otherwise appeared normal.  DUODENUM: A small angiodysplastic lesion with no bleeding found in the 3rd part of the duodenum.  Argon plasma coagulation was applied to the site with good treatment effect.   The remaining examined duodenum and proximal jejunum was normal.  No other angiodysplastic lesions seen.  No evidence of active or recent bleeding.  JEJUNUM: The exam showed no abnormalities in the examined portions of the proximal jejunum.    ADVERSE EVENTS:      There were no immediate complications.  IMPRESSIONS:     1.  The mucosa of the esophagus appeared normal 2.  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  The stomach otherwise appeared normal 4.  Small angiodysplastic lesion with no bleeding found in the 3rd part of the duodenum; ablated with APC 5.  Otherwise normal duodenal mucosa and normal examined proximal jejunum  RECOMMENDATIONS:     1.  Daily PPI 2.  Monitor  Hgb.  Check iron studies, consider IV iron if needed 3.  Colonoscopy to be considered    _____________________________ Jerene Bears, MD  eSigned:  Jerene Bears, MD 04/14/2015 11:20 AM   cc:  The Patient     PATIENT NAME:  [name], [name] MR#: [id number]

## 2015-04-14 NOTE — Progress Notes (Signed)
PATIENT DETAILS [name] Age: 81 y.o. Sex: male Date of Birth: [birth year] Admit Date: 04/12/2015 Admitting Physician Geradine Girt, DO HER:DEYCX, Aris Everts, MD  Subjective: No further melena or hematochezia. Breathing is much better Assessment/Plan: Active Problems: Suspected upper GI bleed: No further bleeding since admission. GI consulted- plans are for endoscopy today. Continue PPI  Acute blood loss anemia: Secondary to above, has anemia from chronic kidney disease at baseline. Transfused 1 unit of PRBC. Hemoglobin remains stable. Follow CBCs.  Acute on chronic diastolic heart failure: Suspect secondary to IV fluids, PRBC transfusion. Much better with IV Lasix, continue to check daily weights, strict intake output  Chronic kidney disease stage III: Creatinine close to her usual baseline. Follow and monitor closely.  Hypoglycemia: Recurred last night-continue D5 till not NPO.Follow CBG's  Chronic atrial fibrillation:CHADSVasc score 7-rate currently controlled with Coreg-Eliquis on hold due to GI bleeding.  Hypertension:Moderately controlled - Continue Coreg.Follow  History of CAD status post CABG: No chest pain. Continue Coreg  S/P AVR with bioprosthetic valve: Suspect shortness of breath is likely secondary to anemia and acute diastolic heart failure. Monitor for now.  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  MEDICATIONS: Scheduled Meds: . sodium chloride   Intravenous Once  . amLODipine  2.5 mg Oral Daily  . carvedilol  6.25 mg Oral BID WC  . furosemide  40 mg Oral Daily  . insulin aspart  0-9 Units Subcutaneous TID WC  . insulin glargine  15 Units Subcutaneous Daily  . nitroGLYCERIN  0.5 inch Topical Q6H  . [START ON 04/16/2015] pantoprazole (PROTONIX) IV  40 mg Intravenous Q12H  . polyethylene glycol powder  0.5 Container Oral Once  . sodium chloride  3 mL Intravenous Q12H   Continuous Infusions: . dextrose 5 % and 0.9% NaCl 50 mL/hr (04/14/15 0627)  . pantoprozole (PROTONIX) infusion 8 mg/hr (04/14/15 0838)   PRN Meds:.hydrALAZINE, ondansetron **OR** ondansetron (ZOFRAN) IV    PHYSICAL EXAM: Vital signs in last 24 hours: Filed Vitals:   04/13/15 1952 04/14/15 0000 04/14/15 0436 04/14/15 0813  BP: 149/64 138/78 154/84 150/64  Pulse: 103 70 94 75  Temp: 98 F (36.7 C) 97.6 F (36.4 C) 97.9 F (36.6 C) 98.5 F (36.9 C)  TempSrc: Oral Oral Oral Axillary  Resp: 17 27 21 18   Height:      Weight:      SpO2: 100% 100% 97% 99%    Weight change:  Filed Weights   04/12/15 1537 04/12/15 1845  Weight: 110.678 kg (244 lb) 108.4 kg (238 lb 15.7 oz)   Body mass index is 34.29 kg/(m^2).   Gen Exam: Awake and alert with clear speech.  Neck: Supple, No JVD.   Chest: Few bibasilar rales CVS: S1 S2 irregular, systolic murmur Abdomen: soft, BS +, non tender, non distended.  Extremities: no edema, lower extremities warm to  touch. Neurologic: Non Focal.  Skin: No Rash.   Wounds: N/A.   Intake/Output from previous day:  Intake/Output Summary (Last 24 hours) at 04/14/15 0920 Last data filed at 04/14/15 0700  Gross per 24 hour  Intake 2637.33 ml  Output   4725 ml  Net -2087.67 ml     LAB RESULTS: CBC  Recent Labs Lab 04/12/15 1606 04/12/15 1946 04/13/15 0337 04/13/15 1815 04/14/15 0723  WBC 5.6 6.8 7.2 6.2  7.5  HGB 8.2* 8.5* 9.1* 9.2* 8.9*  HCT 26.7* 28.2* 29.4* 29.8* 28.2*  PLT 229 242 221 225 198  MCV 84.0 84.7 84.2 84.7 84.7  MCH 25.8* 25.5* 26.1 26.1 26.7  MCHC 30.7 30.1 31.0 30.9 31.6  RDW 14.3 14.4 14.2 14.3 14.3    Chemistries   Recent Labs Lab 04/12/15 1606 04/13/15 0337 04/14/15 0432  NA 137 137 136  K 4.9 4.5 4.7  CL 104 108 101  CO2 24 23 21*  GLUCOSE 91 208* 207*  BUN 34* 27* 24*  CREATININE 2.06* 1.81* 1.92*  CALCIUM 9.1 8.7* 8.9    CBG:  Recent Labs Lab 04/13/15 2319 04/13/15 2334 04/14/15 0010 04/14/15 0439 04/14/15 0814  GLUCAP 34* 34* 111* 193* 252*    GFR Estimated Creatinine Clearance: 34.7 mL/min (by C-G formula based on Cr of 1.92).  Coagulation profile  Recent Labs Lab 04/12/15 1645  INR 1.20    Cardiac Enzymes No results for input(s): CKMB, TROPONINI, MYOGLOBIN in the last 168 hours.  Invalid input(s): CK  Invalid input(s): POCBNP No results for input(s): DDIMER in the last 72 hours. No results for input(s): HGBA1C in the last 72 hours. No results for input(s): CHOL, HDL, LDLCALC, TRIG, CHOLHDL, LDLDIRECT in the last 72 hours. No results for input(s): TSH, T4TOTAL, T3FREE, THYROIDAB in the last 72 hours.  Invalid input(s): FREET3 No results for input(s): VITAMINB12, FOLATE, FERRITIN, TIBC, IRON, RETICCTPCT in the last 72 hours.  Recent Labs  04/12/15 1645  LIPASE 25    Urine Studies No results for input(s): UHGB, CRYS in the last 72 hours.  Invalid input(s): UACOL, UAPR, USPG, UPH, UTP, UGL, UKET, UBIL, UNIT, UROB, ULEU, UEPI, UWBC, URBC, UBAC, CAST, UCOM, BILUA  MICROBIOLOGY: Recent Results (from the past 240 hour(s))  MRSA PCR Screening     Status: None   Collection Time: 04/12/15  7:22 PM  Result Value Ref Range Status   MRSA by PCR NEGATIVE NEGATIVE Final    Comment:        The GeneXpert MRSA Assay (FDA approved for NASAL specimens only), is one component of a comprehensive MRSA colonization surveillance  program. It is not intended to diagnose MRSA infection nor to guide or monitor treatment for MRSA infections.     RADIOLOGY STUDIES/RESULTS: Ct Abdomen Wo Contrast  04/06/2015  CLINICAL DATA:  Indeterminate exophytic renal lesion in the right kidney detected on recent sonogram. EXAM: CT ABDOMEN WITHOUT CONTRAST TECHNIQUE: Multidetector CT imaging of the abdomen was performed following the standard protocol without IV contrast. COMPARISON:  03/15/2015 retroperitoneal sonogram. 01/09/2008 CT abdomen. FINDINGS: Lower chest: No significant pulmonary nodules or acute consolidative airspace disease. Hepatobiliary: Normal liver with no liver mass. There are layering tiny subcentimeter calcified gallstones in the nondistended gallbladder, with no gallbladder wall thickening or pericholecystic fat stranding. No biliary ductal dilatation. Pancreas: Normal, with no mass or duct dilation. Spleen: Normal size spleen. No splenic mass. Tiny vascular versus granulomatous calcifications in the medial spleen. Adrenals/Urinary Tract: Normal adrenals. There is mild fullness of  the left renal collecting system without overt left hydronephrosis. No renal stones. There is an exophytic 1.3 cm lesion in the lateral interpolar right kidney with complex fluid density, which measured 1.5 cm on 01/09/2008, and is slightly decreased in size, in keeping with a benign mildly hemorrhagic/ proteinaceous Bosniak category 2 right renal cyst. No new contour deforming right renal lesions. There is a minimally complex 3.8 cm partially exophytic renal cyst in the posterior upper left kidney, which demonstrates thin curvilinear mural calcification, which measured 5.1 cm on 01/09/2008 and is decreased in size, in keeping with a benign Bosniak category 2 left renal cyst. There is a simple 1.3 cm renal cyst in the posterior interpolar left kidney. No new contour deforming left renal lesions. Stomach/Bowel: Grossly normal stomach. Visualized small  and large bowel is normal caliber, with no bowel wall thickening. Vascular/Lymphatic: Atherosclerotic nonaneurysmal abdominal aorta. No pathologically enlarged lymph nodes in the abdomen. Other: No pneumoperitoneum, ascites or focal fluid collection. Musculoskeletal: No aggressive appearing focal osseous lesions. Marked degenerative changes in the visualized thoracolumbar spine. IMPRESSION: 1. Benign Bosniak category 2 renal cysts in both kidneys, which are decreased in size since 2009. No suspicious renal lesions on this noncontrast study. 2. Mild fullness of the left renal collecting system without overt left hydronephrosis. No nephrolithiasis in the visualized portions of the urinary tracts. 3. Cholelithiasis, with no evidence of acute cholecystitis. No biliary ductal dilatation. Electronically Signed   By: Ilona Sorrel M.D.   On: 04/06/2015 14:55   Dg Chest 2 View  04/12/2015  CLINICAL DATA:  Shortness of breath, weakness. EXAM: CHEST  2 VIEW COMPARISON:  06/29/2014 FINDINGS: Prior valve replacement. Cardiomegaly. No confluent airspace opacities, effusions or edema. Degenerative changes in the thoracic spine. No acute bony abnormality. IMPRESSION: Cardiomegaly.  No active disease. Electronically Signed   By: Rolm Baptise M.D.   On: 04/12/2015 16:42   US Renal  03/15/2015  CLINICAL DATA:  Chronic kidney disease stage 3 EXAM: RENAL / URINARY TRACT ULTRASOUND COMPLETE COMPARISON:  Ultrasound of the kidneys of 01/30/2012 FINDINGS: Right Kidney: Length: 13.3 cm. No hydronephrosis is seen. In the midpole there is a hypoechoic structure with some echoes measuring 1.4 cm most likely representing a slightly complex cyst. However this area was not seen on the prior ultrasound and if CT cannot be performed, MR would be recommended to exclude a possible neoplasm. No definite blood flow is seen in that region. Left Kidney: Length: 12.7 cm. Septated complex cyst is noted in the upper pole of 4.2 cm compared to 4.9 cm  previously. Also in smaller cyst in the midpole measures 1.4 cm compared to 1.4 cm previously. Bladder: The urinary bladder is difficult to evaluate, with probable debris layering dependently posteriorly. No definite soft tissue mass is seen. IMPRESSION: 1. No hydronephrosis. 2. New exophytic complex structure emanating from the mid lateral right kidney of 1.4 cm. Possible complex cyst but consider CT or MRI to assess further. Electronically Signed   By: Ivar Drape M.D.   On: 03/15/2015 14:16   Dg Chest Port 1 View  04/14/2015  CLINICAL DATA:  Shortness of breath. EXAM: PORTABLE CHEST 1 VIEW COMPARISON:  04/12/2015. FINDINGS: Prior cardiac valve replacement. Cardiomegaly. Mild pulmonary vascular prominence interstitial prominence consistent congestive heart failure. Small left pleural effusion. No pneumothorax . IMPRESSION: Prior cardiac valve replacement. Cardiomegaly with pulmonary vascular prominence and interstitial prominence and small left pleural effusion consistent with congestive heart failure. Pulmonary interstitial edema is new from 04/12/2015 . Electronically  Signed   ByMarcello Moores  Register   On: 04/14/2015 07:16    Oren Binet, MD  [facility] Pager:[phone]  If 7PM-7AM, please contact night-coverage [email or web address] Password [email or web address] 04/14/2015, 9:20 AM   LOS: 2 days

## 2015-04-15 LAB — CBC
HCT: 28.8 % — ABNORMAL LOW (ref 39.0–52.0)
HEMOGLOBIN: 8.9 g/dL — AB (ref 13.0–17.0)
MCH: 26.2 pg (ref 26.0–34.0)
MCHC: 30.9 g/dL (ref 30.0–36.0)
MCV: 84.7 fL (ref 78.0–100.0)
Platelets: 232 10*3/uL (ref 150–400)
RBC: 3.4 MIL/uL — AB (ref 4.22–5.81)
RDW: 14.1 % (ref 11.5–15.5)
WBC: 6.1 10*3/uL (ref 4.0–10.5)

## 2015-04-15 LAB — GLUCOSE, CAPILLARY
GLUCOSE-CAPILLARY: 223 mg/dL — AB (ref 65–99)
GLUCOSE-CAPILLARY: 371 mg/dL — AB (ref 65–99)
GLUCOSE-CAPILLARY: 301 mg/dL — AB (ref 65–99)
Glucose-Capillary: 200 mg/dL — ABNORMAL HIGH (ref 65–99)
Glucose-Capillary: 161 mg/dL — ABNORMAL HIGH (ref 65–99)

## 2015-04-15 LAB — BASIC METABOLIC PANEL
Anion gap: 9 (ref 5–15)
BUN: 27 mg/dL — ABNORMAL HIGH (ref 6–20)
CALCIUM: 8.6 mg/dL — AB (ref 8.9–10.3)
CHLORIDE: 101 mmol/L (ref 101–111)
CO2: 25 mmol/L (ref 22–32)
CREATININE: 2.22 mg/dL — AB (ref 0.61–1.24)
GFR calc non Af Amer: 25 mL/min — ABNORMAL LOW (ref >60)
GFR, EST AFRICAN AMERICAN: 29 mL/min — AB (ref >60)
Glucose, Bld: 237 mg/dL — ABNORMAL HIGH (ref 65–99)
POTASSIUM: 4.3 mmol/L (ref 3.5–5.1)
SODIUM: 135 mmol/L (ref 135–145)

## 2015-04-15 MED ORDER — INSULIN GLARGINE 100 UNIT/ML ~~LOC~~ SOLN
30.0000 [IU] | Freq: Every day | SUBCUTANEOUS | Status: DC
  Administered 2015-04-16: 30 [IU] via SUBCUTANEOUS
  Filled 2015-04-15: qty 0.3

## 2015-04-15 MED ORDER — AMLODIPINE BESYLATE 5 MG PO TABS
5.0000 mg | ORAL_TABLET | Freq: Every day | ORAL | Status: DC
  Administered 2015-04-16: 5 mg via ORAL
  Filled 2015-04-15 (×2): qty 1

## 2015-04-15 MED ORDER — SODIUM CHLORIDE 0.9 % IV SOLN
25.0000 mg | Freq: Once | INTRAVENOUS | Status: AC
  Administered 2015-04-15: 25 mg via INTRAVENOUS
  Filled 2015-04-15: qty 0.5

## 2015-04-15 MED ORDER — SODIUM CHLORIDE 0.9 % IV SOLN
250.0000 mg | Freq: Once | INTRAVENOUS | Status: AC
  Administered 2015-04-16: 250 mg via INTRAVENOUS
  Filled 2015-04-15 (×3): qty 5

## 2015-04-15 MED ORDER — VITAMIN D 1000 UNITS PO TABS
1000.0000 [IU] | ORAL_TABLET | Freq: Every day | ORAL | Status: DC
  Administered 2015-04-15 – 2015-04-17 (×3): 1000 [IU] via ORAL
  Filled 2015-04-15 (×3): qty 1

## 2015-04-15 MED ORDER — TAMSULOSIN HCL 0.4 MG PO CAPS
0.4000 mg | ORAL_CAPSULE | Freq: Every day | ORAL | Status: DC
  Administered 2015-04-15 – 2015-04-16 (×2): 0.4 mg via ORAL
  Filled 2015-04-15 (×2): qty 1

## 2015-04-15 MED ORDER — SODIUM CHLORIDE 0.9 % IV SOLN
1000.0000 mg | Freq: Once | INTRAVENOUS | Status: AC
  Administered 2015-04-15: 1000 mg via INTRAVENOUS
  Filled 2015-04-15 (×2): qty 20

## 2015-04-15 MED ORDER — ISOSORBIDE MONONITRATE ER 30 MG PO TB24
15.0000 mg | ORAL_TABLET | Freq: Every day | ORAL | Status: DC
  Administered 2015-04-15 – 2015-04-17 (×3): 15 mg via ORAL
  Filled 2015-04-15 (×3): qty 1

## 2015-04-15 MED ORDER — INSULIN ASPART 100 UNIT/ML ~~LOC~~ SOLN
0.0000 [IU] | Freq: Three times a day (TID) | SUBCUTANEOUS | Status: DC
  Administered 2015-04-15: 11 [IU] via SUBCUTANEOUS
  Administered 2015-04-16: 8 [IU] via SUBCUTANEOUS
  Administered 2015-04-16: 11 [IU] via SUBCUTANEOUS
  Administered 2015-04-16: 15 [IU] via SUBCUTANEOUS
  Administered 2015-04-17: 11 [IU] via SUBCUTANEOUS

## 2015-04-15 NOTE — Evaluation (Signed)
Physical Therapy Evaluation Patient Details [name] MRN: [id number] DOB: December 05, 1927 Today's Date: 04/15/2015   History of Present Illness  Patient is an 81 y.o. male With PMHX of atrial fib, AVR- bioprosthetic,CABG.Adm due to upper GI bleed, became anemic requiring transfusion. 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 Wife passed away this year, 10-16-2022.  Clinical Impression  Patient adm due to above. Patient presents with balance deficits, generalized weakness and is a fall risk due to decreased safety awareness. Patient lives alone, due to wife passing away 10-16-2022 of this year. Patient reports he will not have 24/7 (A) when returning home. At this time recommend SNF due to flight of steps to reach bedroom and balance deficits. Will cont to follow per POC to address deficits. Patient very hopeful to D/C home with [facility] services.     Follow Up Recommendations SNF;Supervision/Assistance - 24 hour;Other (comment) (if patient can have 24 hr supervision will need HHPT )    Equipment Recommendations  None recommended by PT    Recommendations for Other Services OT consult     Precautions / Restrictions Precautions Precautions: Fall Restrictions Weight Bearing Restrictions: No      Mobility  Bed Mobility               General bed mobility comments: up in chair  Transfers Overall transfer level: Needs assistance Equipment used: Rolling walker (2 wheeled) Transfers: Sit to/from Stand Sit to Stand: Min guard         General transfer comment: cues for RW safety; unsteady with sway when standing  Ambulation/Gait Ambulation/Gait assistance: Min guard Ambulation Distance (Feet): 150 Feet Assistive device: Rolling walker (2 wheeled) Gait Pattern/deviations: Step-through pattern;Decreased stride length;Wide base of support Gait velocity: impulsive   General Gait Details: unsteady with directional changes; cues for proper heel strike and gait sequencing; min  guard to steady and manage RW  Stairs            Wheelchair Mobility    Modified Rankin (Stroke Patients Only)       Balance Overall balance assessment: Needs assistance;History of Falls Sitting-balance support: Feet supported;No upper extremity supported Sitting balance-[name]: Good     Standing balance support: During functional activity;Bilateral upper extremity supported;Single extremity supported Standing balance-[name]: Poor Standing balance comment: unsteady with toileting; requiring UE support to balance              High level balance activites: Direction changes;Turns High Level Balance Comments: unsteady; min guard             Pertinent Vitals/Pain Pain Assessment: No/denies pain    Home Living Family/patient expects to be discharged to:: Private residence Living Arrangements: Alone Available Help at Discharge: Family;Available PRN/intermittently Type of Home: House Home Access: Stairs to enter Entrance Stairs-Rails: Left Entrance Stairs-Number of Steps: 3 Home Layout: Two level;Bed/bath upstairs Home Equipment: Cane - single point;Walker - 2 wheels Additional Comments: Pt has grab bars for toilet and shower. Pt has walk in shower and tub/shower combo. He owns shower chair.  Pt was using a cane or a RW (when feeling unsteady) when going outside of the house    Prior Function Level of Independence: Independent with assistive device(s)         Comments: son is MD; was a [employer]   Dominant Hand: Right    Extremity/Trunk Assessment   Upper Extremity Assessment: Defer to OT evaluation  Lower Extremity Assessment: Generalized weakness      Cervical / Trunk Assessment: Normal  Communication   Communication: No difficulties  Cognition Arousal/Alertness: Awake/alert Behavior During Therapy: Impulsive Overall Cognitive Status: Impaired/Different from baseline Area of Impairment:  Safety/judgement         Safety/Judgement: Decreased awareness of deficits;Decreased awareness of safety     General Comments: impulsive; decreased safety awareness with lines and leads    General Comments General comments (skin integrity, edema, etc.): discussed D/C planning with patient    Exercises        Assessment/Plan    PT Assessment Patient needs continued PT services  PT Diagnosis Difficulty walking;Generalized weakness   PT Problem List Decreased strength;Decreased balance;Decreased mobility;Decreased safety awareness;Impaired sensation  PT Treatment Interventions DME instruction;Gait training;Stair training;Functional mobility training;Therapeutic exercise;Therapeutic activities;Balance training;Neuromuscular re-education;Patient/family education   PT Goals (Current goals can be found in the Care Plan section) Acute Rehab PT Goals Patient Stated Goal: to go home PT Goal Formulation: With patient Time For Goal Achievement: 04/29/15 Potential to Achieve Goals: Good    Frequency Min 3X/week   Barriers to discharge Decreased caregiver support;Inaccessible home environment flight of steps to get to bedroom/bathroom; lives alone; wife died this past may    Co-evaluation               End of Session Equipment Utilized During Treatment: Gait belt Activity Tolerance: Patient limited by fatigue Patient left: in chair;with call bell/phone within reach Nurse Communication: Mobility status         Time: 1035-1100 PT Time Calculation (min) (ACUTE ONLY): 25 min   Charges:   PT Evaluation $Initial PT Evaluation Tier I: 1 Procedure PT Treatments $Gait Training: 8-22 mins   PT G Codes:        Enez Monahan, [place] N PT  04/15/2015, 11:19 AM

## 2015-04-15 NOTE — Progress Notes (Signed)
PATIENT DETAILS [name] Age: 81 y.o. Sex: male Date of Birth: 1927-11-08 Admit Date: 04/12/2015 Admitting Physician Geradine Girt, DO ION:GEXBM, Aris Everts, MD  Subjective: No further melena or hematochezia- Breathing is much better. Sleeping (lying flat) when I walked in.   Assessment/Plan: Active Problems: Suspected upper GI bleed: No further bleeding since admission. GI consulted-underwent endoscopy on 11/4-which . Continue PPI  Acute blood loss anemia: Secondary to above, has anemia from chronic kidney disease at baseline. Transfused 1 unit of PRBC this admit. Hemoglobin remains stable at 8.9. Have asked pharmacy to dose IV Iron.  Acute on chronic diastolic heart failure: Suspect secondary to IV fluids, PRBC transfusion. Now compensated with Lasix, continue to check daily weights, strict intake output. Follow lytes  Chronic kidney disease stage III: Creatinine slightly elevated than his usual baseline. Follow and monitor closely.  Hypoglycemia: resolved.  Chronic atrial fibrillation:CHADSVasc score 7-rate currently controlled with Coreg-Eliquis on hold due to GI bleeding.Spoke with Dr Hilarie Fredrickson on 11/4-recommendations were to hold Anticoagulation for atleast 3-5 days. Cards following-consideration for watchman device.  Hypertension:Moderately controlled - Continue Coreg,increase amlodipine to 5 mg,change transdermal nitrate to oral .Follow and adjust accordingly  History of CAD status post CABG: No chest pain. Continue Coreg  S/P AVR with bioprosthetic valve: Suspect shortness of breath is likely secondary to anemia and acute diastolic heart failure. Monitor for now.  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: Monitor in telemetry-cardiiology following  Type 2 diabetes: Hypoglycemic on admission-but CBG's now stable-increase Lantus to 30 units-follow  Disposition: Remain inpatient-transfer to telemetry  Antimicrobial  agents  See below  Anti-infectives    None      DVT Prophylaxis:  SCD's  Code Status: Full code   Family Communication Son over the phone  Procedures: None  CONSULTS:  GI  Time spent 30 minutes-Greater than 50% of this time was spent in counseling, explanation of diagnosis, planning of further management, and coordination of care.  MEDICATIONS: Scheduled Meds: . sodium chloride   Intravenous Once  . amLODipine  2.5 mg Oral Daily  . carvedilol  6.25 mg Oral BID WC  . furosemide  40 mg Oral Daily  . insulin aspart  0-9 Units Subcutaneous TID WC  . insulin glargine  15 Units Subcutaneous Daily  . iron dextran (INFED/DEXFERRUM) infusion  1,000 mg Intravenous Once  . [START ON 04/16/2015] iron dextran (INFED/DEXFERRUM) infusion  250 mg Intravenous Once  . nitroGLYCERIN  0.5 inch Topical Q6H  . pantoprazole  40 mg Oral Q0600  . polyethylene glycol powder  0.5 Container Oral Once  . sodium chloride  3 mL Intravenous Q12H   Continuous Infusions:   PRN Meds:.hydrALAZINE, ondansetron **OR** ondansetron (ZOFRAN) IV    PHYSICAL EXAM: Vital signs in last 24 hours: Filed Vitals:   04/15/15 0400 04/15/15 0500 04/15/15 0839 04/15/15 0935  BP: 148/66  187/93 191/83  Pulse: 71  75   Temp: 98.3 F (36.8 C)     TempSrc: Oral     Resp:      Height:      Weight:  108.1 kg (238 lb 5.1 oz)    SpO2: 100%  96%     Weight change:  Filed Weights   04/12/15 1537 04/12/15 1845 04/15/15 0500  Weight: 110.678 kg (244 lb) 108.4 kg (238 lb 15.7 oz) 108.1 kg (238 lb 5.1 oz)   Body mass index is  34.19 kg/(m^2).   Gen Exam: Awake and alert with clear speech.  Neck: Supple, No JVD.   Chest: Clear anteriorly CVS: S1 S2 irregular, systolic murmur Abdomen: soft, BS +, non tender, non distended.  Extremities: no edema, lower extremities warm to touch. Neurologic: Non Focal.  Skin: No Rash.   Wounds: N/A.   Intake/Output from previous day:  Intake/Output Summary (Last 24 hours)  at 04/15/15 0946 Last data filed at 04/15/15 0900  Gross per 24 hour  Intake    700 ml  Output   1400 ml  Net   -700 ml     LAB RESULTS: CBC  Recent Labs Lab 04/12/15 1946 04/13/15 0337 04/13/15 1815 04/14/15 0723 04/15/15 0455  WBC 6.8 7.2 6.2 7.5 6.1  HGB 8.5* 9.1* 9.2* 8.9* 8.9*  HCT 28.2* 29.4* 29.8* 28.2* 28.8*  PLT 242 221 225 198 232  MCV 84.7 84.2 84.7 84.7 84.7  MCH 25.5* 26.1 26.1 26.7 26.2  MCHC 30.1 31.0 30.9 31.6 30.9  RDW 14.4 14.2 14.3 14.3 14.1    Chemistries   Recent Labs Lab 04/12/15 1606 04/13/15 0337 04/14/15 0432 04/15/15 0455  NA 137 137 136 135  K 4.9 4.5 4.7 4.3  CL 104 108 101 101  CO2 24 23 21* 25  GLUCOSE 91 208* 207* 237*  BUN 34* 27* 24* 27*  CREATININE 2.06* 1.81* 1.92* 2.22*  CALCIUM 9.1 8.7* 8.9 8.6*    CBG:  Recent Labs Lab 04/14/15 2024 04/14/15 2117 04/14/15 2332 04/15/15 0205 04/15/15 0537  GLUCAP 375* 332* 296* 200* 223*    GFR Estimated Creatinine Clearance: 29.9 mL/min (by C-G formula based on Cr of 2.22).  Coagulation profile  Recent Labs Lab 04/12/15 1645  INR 1.20    Cardiac Enzymes No results for input(s): CKMB, TROPONINI, MYOGLOBIN in the last 168 hours.  Invalid input(s): CK  Invalid input(s): POCBNP No results for input(s): DDIMER in the last 72 hours. No results for input(s): HGBA1C in the last 72 hours. No results for input(s): CHOL, HDL, LDLCALC, TRIG, CHOLHDL, LDLDIRECT in the last 72 hours. No results for input(s): TSH, T4TOTAL, T3FREE, THYROIDAB in the last 72 hours.  Invalid input(s): FREET3  Recent Labs  04/14/15 1319  FERRITIN 34  TIBC 339  IRON 12*    Recent Labs  04/12/15 1645  LIPASE 25    Urine Studies No results for input(s): UHGB, CRYS in the last 72 hours.  Invalid input(s): UACOL, UAPR, USPG, UPH, UTP, UGL, UKET, UBIL, UNIT, UROB, ULEU, UEPI, UWBC, URBC, UBAC, CAST, UCOM, BILUA  MICROBIOLOGY: Recent Results (from the past 240 hour(s))  MRSA PCR  Screening     Status: None   Collection Time: 04/12/15  7:22 PM  Result Value Ref Range Status   MRSA by PCR NEGATIVE NEGATIVE Final    Comment:        The GeneXpert MRSA Assay (FDA approved for NASAL specimens only), is one component of a comprehensive MRSA colonization surveillance program. It is not intended to diagnose MRSA infection nor to guide or monitor treatment for MRSA infections.     RADIOLOGY STUDIES/RESULTS: Ct Abdomen Wo Contrast  04/06/2015  CLINICAL DATA:  Indeterminate exophytic renal lesion in the right kidney detected on recent sonogram. EXAM: CT ABDOMEN WITHOUT CONTRAST TECHNIQUE: Multidetector CT imaging of the abdomen was performed following the standard protocol without IV contrast. COMPARISON:  03/15/2015 retroperitoneal sonogram. 01/09/2008 CT abdomen. FINDINGS: Lower chest: No significant pulmonary nodules or acute consolidative airspace disease. Hepatobiliary: Normal liver  with no liver mass. There are layering tiny subcentimeter calcified gallstones in the nondistended gallbladder, with no gallbladder wall thickening or pericholecystic fat stranding. No biliary ductal dilatation. Pancreas: Normal, with no mass or duct dilation. Spleen: Normal size spleen. No splenic mass. Tiny vascular versus granulomatous calcifications in the medial spleen. Adrenals/Urinary Tract: Normal adrenals. There is mild fullness of the left renal collecting system without overt left hydronephrosis. No renal stones. There is an exophytic 1.3 cm lesion in the lateral interpolar right kidney with complex fluid density, which measured 1.5 cm on 01/09/2008, and is slightly decreased in size, in keeping with a benign mildly hemorrhagic/ proteinaceous Bosniak category 2 right renal cyst. No new contour deforming right renal lesions. There is a minimally complex 3.8 cm partially exophytic renal cyst in the posterior upper left kidney, which demonstrates thin curvilinear mural calcification, which  measured 5.1 cm on 01/09/2008 and is decreased in size, in keeping with a benign Bosniak category 2 left renal cyst. There is a simple 1.3 cm renal cyst in the posterior interpolar left kidney. No new contour deforming left renal lesions. Stomach/Bowel: Grossly normal stomach. Visualized small and large bowel is normal caliber, with no bowel wall thickening. Vascular/Lymphatic: Atherosclerotic nonaneurysmal abdominal aorta. No pathologically enlarged lymph nodes in the abdomen. Other: No pneumoperitoneum, ascites or focal fluid collection. Musculoskeletal: No aggressive appearing focal osseous lesions. Marked degenerative changes in the visualized thoracolumbar spine. IMPRESSION: 1. Benign Bosniak category 2 renal cysts in both kidneys, which are decreased in size since 2009. No suspicious renal lesions on this noncontrast study. 2. Mild fullness of the left renal collecting system without overt left hydronephrosis. No nephrolithiasis in the visualized portions of the urinary tracts. 3. Cholelithiasis, with no evidence of acute cholecystitis. No biliary ductal dilatation. Electronically Signed   By: Ilona Sorrel M.D.   On: 04/06/2015 14:55   Dg Chest 2 View  04/12/2015  CLINICAL DATA:  Shortness of breath, weakness. EXAM: CHEST  2 VIEW COMPARISON:  06/29/2014 FINDINGS: Prior valve replacement. Cardiomegaly. No confluent airspace opacities, effusions or edema. Degenerative changes in the thoracic spine. No acute bony abnormality. IMPRESSION: Cardiomegaly.  No active disease. Electronically Signed   By: Rolm Baptise M.D.   On: 04/12/2015 16:42   Dg Chest Port 1 View  04/14/2015  CLINICAL DATA:  Shortness of breath. EXAM: PORTABLE CHEST 1 VIEW COMPARISON:  04/12/2015. FINDINGS: Prior cardiac valve replacement. Cardiomegaly. Mild pulmonary vascular prominence interstitial prominence consistent congestive heart failure. Small left pleural effusion. No pneumothorax . IMPRESSION: Prior cardiac valve replacement.  Cardiomegaly with pulmonary vascular prominence and interstitial prominence and small left pleural effusion consistent with congestive heart failure. Pulmonary interstitial edema is new from 04/12/2015 . Electronically Signed   By: Marcello Moores  Register   On: 04/14/2015 07:16    Oren Binet, MD  [facility] Pager:[phone]  If 7PM-7AM, please contact night-coverage [email or web address] Password [email or web address] 04/15/2015, 9:46 AM   LOS: 3 days

## 2015-04-15 NOTE — Progress Notes (Signed)
Arrived in the unit accompanied by Staff. Patient's stable. No distress noted.

## 2015-04-15 NOTE — Progress Notes (Signed)
MEDICATION RELATED CONSULT NOTE - INITIAL   Pharmacy Consult for Iron dextran Indication: Iron deficiency  Allergies  Allergen Reactions  . Asa [Aspirin]   . Metoprolol     bradycardia  . Penicillins Swelling         Patient Measurements: Height: 5\' 10"  (177.8 cm) Weight: 238 lb 5.1 oz (108.1 kg) IBW/kg (Calculated) : 73 Adjusted Body Weight: 87  Vital Signs: Temp: 98.3 F (36.8 C) (11/05 0400) Temp Source: Oral (11/05 0400) BP: 187/93 mmHg (11/05 0839) Pulse Rate: 75 (11/05 0839) Intake/Output from previous day: 11/04 0701 - 11/05 0700 In: 800 [I.V.:800] Out: 1050 [Urine:1050] Intake/Output from this shift: Total I/O In: -  Out: 200 [Urine:200]  Labs:  Recent Labs  04/12/15 1645  04/13/15 0337 04/13/15 1815 04/14/15 0432 04/14/15 0723 04/15/15 0455  WBC  --   < > 7.2 6.2  --  7.5 6.1  HGB  --   < > 9.1* 9.2*  --  8.9* 8.9*  HCT  --   < > 29.4* 29.8*  --  28.2* 28.8*  PLT  --   < > 221 225  --  198 232  APTT 34  --   --   --   --   --   --   CREATININE  --   --  1.81*  --  1.92*  --  2.22*  ALBUMIN 3.5  --   --   --   --   --   --   PROT 6.4*  --   --   --   --   --   --   AST 27  --   --   --   --   --   --   ALT 16*  --   --   --   --   --   --   ALKPHOS 94  --   --   --   --   --   --   BILITOT 0.6  --   --   --   --   --   --   BILIDIR 0.1  --   --   --   --   --   --   IBILI 0.5  --   --   --   --   --   --   < > = values in this interval not displayed. Estimated Creatinine Clearance: 29.9 mL/min (by C-G formula based on Cr of 2.22).   Microbiology: Recent Results (from the past 720 hour(s))  MRSA PCR Screening     Status: None   Collection Time: 04/12/15  7:22 PM  Result Value Ref Range Status   MRSA by PCR NEGATIVE NEGATIVE Final    Comment:        The GeneXpert MRSA Assay (FDA approved for NASAL specimens only), is one component of a comprehensive MRSA colonization surveillance program. It is not intended to diagnose MRSA infection  nor to guide or monitor treatment for MRSA infections.     Medical History: Past Medical History  Diagnosis Date  . Aortic stenosis, severe   . Hypertension   . Diabetes mellitus   . OA (osteoarthritis)   . Junctional bradycardia   . BPH (benign prostatic hypertrophy)   . Hyperlipidemia   . Anemia   . Gastrointestinal bleed   . CAD (coronary artery disease)   . Mobitz type 1 second degree atrioventricular block 10/01/2012  . History of bladder cancer   .  Macular degeneration     Medications:  Scheduled:  . sodium chloride   Intravenous Once  . amLODipine  2.5 mg Oral Daily  . carvedilol  6.25 mg Oral BID WC  . furosemide  40 mg Oral Daily  . insulin aspart  0-9 Units Subcutaneous TID WC  . insulin glargine  15 Units Subcutaneous Daily  . iron dextran (INFED/DEXFERRUM) infusion  25 mg Intravenous Once   Followed by  . iron dextran (INFED/DEXFERRUM) infusion  1,000 mg Intravenous Once  . [START ON 04/16/2015] iron dextran (INFED/DEXFERRUM) infusion  250 mg Intravenous Once  . nitroGLYCERIN  0.5 inch Topical Q6H  . pantoprazole  40 mg Oral Q0600  . polyethylene glycol powder  0.5 Container Oral Once  . sodium chloride  3 mL Intravenous Q12H    Assessment: 81yo male with Fe deficiency, pharmacy has been asked to dose IV Iron.  Fe level is low at 12 and TSat is low as well.  Based on goal Hg 12, pt will require Fe Dextran 1300mg .  Goal of Therapy:  Iron replacement  Plan:  1- Iron dextran 25mg  IV x 1, test dose & if no reaction 2- Iron dextran 1000mg  IV x 1 today followed by 250mg  IV x 1 on 11/6  Gracy Bruins, PharmD [facility]

## 2015-04-15 NOTE — Progress Notes (Signed)
Transferred to [place]; Report called to [place], Therapist, sports.

## 2015-04-16 LAB — GLUCOSE, CAPILLARY
GLUCOSE-CAPILLARY: 335 mg/dL — AB (ref 65–99)
GLUCOSE-CAPILLARY: 293 mg/dL — AB (ref 65–99)
GLUCOSE-CAPILLARY: 299 mg/dL — AB (ref 65–99)
Glucose-Capillary: 383 mg/dL — ABNORMAL HIGH (ref 65–99)
Glucose-Capillary: 342 mg/dL — ABNORMAL HIGH (ref 65–99)
Glucose-Capillary: 137 mg/dL — ABNORMAL HIGH (ref 65–99)

## 2015-04-16 LAB — BASIC METABOLIC PANEL
ANION GAP: 11 (ref 5–15)
BUN: 35 mg/dL — ABNORMAL HIGH (ref 6–20)
CHLORIDE: 100 mmol/L — AB (ref 101–111)
CO2: 23 mmol/L (ref 22–32)
Calcium: 8.8 mg/dL — ABNORMAL LOW (ref 8.9–10.3)
Creatinine, Ser: 2.32 mg/dL — ABNORMAL HIGH (ref 0.61–1.24)
GFR calc Af Amer: 28 mL/min — ABNORMAL LOW (ref >60)
GFR, EST NON AFRICAN AMERICAN: 24 mL/min — AB (ref >60)
Glucose, Bld: 318 mg/dL — ABNORMAL HIGH (ref 65–99)
POTASSIUM: 4.8 mmol/L (ref 3.5–5.1)
SODIUM: 134 mmol/L — AB (ref 135–145)

## 2015-04-16 LAB — CBC
HCT: 28.3 % — ABNORMAL LOW (ref 39.0–52.0)
Hemoglobin: 9 g/dL — ABNORMAL LOW (ref 13.0–17.0)
MCH: 26.7 pg (ref 26.0–34.0)
MCHC: 31.8 g/dL (ref 30.0–36.0)
MCV: 84 fL (ref 78.0–100.0)
PLATELETS: 203 10*3/uL (ref 150–400)
RBC: 3.37 MIL/uL — AB (ref 4.22–5.81)
RDW: 14.1 % (ref 11.5–15.5)
WBC: 5.9 10*3/uL (ref 4.0–10.5)

## 2015-04-16 MED ORDER — INSULIN GLARGINE 100 UNIT/ML ~~LOC~~ SOLN
50.0000 [IU] | Freq: Every day | SUBCUTANEOUS | Status: DC
  Administered 2015-04-17: 50 [IU] via SUBCUTANEOUS
  Filled 2015-04-16: qty 0.5

## 2015-04-16 MED ORDER — INSULIN GLARGINE 100 UNIT/ML ~~LOC~~ SOLN
20.0000 [IU] | Freq: Once | SUBCUTANEOUS | Status: AC
  Administered 2015-04-16: 20 [IU] via SUBCUTANEOUS
  Filled 2015-04-16: qty 0.2

## 2015-04-16 MED ORDER — CARVEDILOL 6.25 MG PO TABS
6.2500 mg | ORAL_TABLET | Freq: Two times a day (BID) | ORAL | Status: DC
Start: 2015-04-16 — End: 2015-04-17
  Administered 2015-04-16 – 2015-04-17 (×3): 6.25 mg via ORAL
  Filled 2015-04-16 (×3): qty 1

## 2015-04-16 NOTE — Clinical Social Work Note (Signed)
Clinical Social Work Assessment  Patient Details  [name] MRN: [id number] Date of Birth: [birth year]  Date of referral:  04/16/15               Reason for consult:  Facility Placement                Permission sought to share information with:  Family Supports Permission granted to share information::  Yes, Verbal Permission Granted  Name::     [name].   Agency::     Relationship::  son  Contact Information:     Housing/Transportation Living arrangements for the past 2 months:  [place] of Information:  Patient, Adult Children Patient Interpreter Needed:  None Criminal Activity/Legal Involvement Pertinent to Current Situation/Hospitalization:  No - Comment as needed Significant Relationships:  Adult Children Lives with:  Self Do you feel safe going back to the place where you live?  Yes Need for family participation in patient care:  Yes (Comment)  Care giving concerns: Patient states that he would prefer to go home but understands that he needs additional rehabilitation. Patient states that he is concerned that he has PT and OT at home and does not want to stop those services. CSW explained that these services can be done at the SNF. Patient states that he would like to go to [facility] in [place] to be close to his network of friends. However, patient stated that he would defer to his son's choice for his placement.    Social Worker assessment / plan: Provided list in patient's room and called son who stated that his top preferences were [facility] and [facility]. CSW also emailed copy of SNF list to son for review. CSW will fax out to above facilities and [facility].   Employment status:  Retired Health visitor, Managed Care PT Recommendations:  [place] / Referral to community resources:  [place]  Patient/Family's Response to care: Patient agreeable to care  plan  Patient/Family's Understanding of and Emotional Response to Diagnosis, Current Treatment, and Prognosis: Patient and family hopefull for patient's recovery, but open to discussion of longterm SNF  Emotional Assessment Appearance:  Appears stated age Attitude/Demeanor/Rapport:   (Patient was very pleasant.) Affect (typically observed):  Accepting Orientation:  Oriented to Self, Oriented to Place, Oriented to  Time, Oriented to Situation Alcohol / Substance use:  Not Applicable Psych involvement (Current and /or in the community):  No (Comment)  Discharge Needs  Concerns to be addressed:  No discharge needs identified Readmission within the last 30 days:    Current discharge risk:  None Barriers to Discharge:  No Barriers Identified   Christene Lye, LCSW 04/16/2015, 3:53 PM

## 2015-04-16 NOTE — Progress Notes (Signed)
Patient IV in the right wrist  Leaking when this writer flushed with normal saline as ordered.  This writer removed the IV and put a pressure on the right wrist with scant amount of blood noted in the 2 X 2 gauze.  While getting more gauze the IV site bleeds more. This writer put more pressure and the bleeding stopped. The social worker was inside the room.Charge nurse and MD notified.

## 2015-04-16 NOTE — NC FL2 (Signed)
[place] MEDICAID FL2 LEVEL OF CARE SCREENING TOOL     IDENTIFICATION  Patient [name] Birthdate: 1928/06/06 Sex: male Admission Date (Current Location): 04/12/2015  [facility] and [place] Number: Engineering geologist and Address:  The . [facility], [place] [address] [place], [place] [address]      Provider Number: [id number]  Attending Physician Name and Address:  Jonetta Osgood, MD  Relative Name and Phone Number:       Current Level of Care: Hospital Recommended Level of Care: Nursing Facility Prior Approval Number:    Date Approved/Denied:   PASRR Number: [id number]  Discharge Plan: SNF    Current Diagnoses: Patient Active Problem List   Diagnosis Date Noted  . Symptomatic anemia   . Angiodysplasia of stomach and duodenum   . Acute combined systolic and diastolic congestive heart failure ([place])   . Long-term (current) use of anticoagulants   . Shortness of breath 04/12/2015  . GI bleed 04/12/2015  . UGIB (upper gastrointestinal bleed) 04/12/2015  . Melena   . Acute blood loss anemia   . Chronic anticoagulation   . Generalized abdominal pain   . Flank pain 01/31/2015  . Macular degeneration, dry 01/20/2015  . Weight loss 01/20/2015  . Arthritis 01/20/2015  . 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 Peripheral sensory neuropathy due to type 2 diabetes mellitus ([place]) 09/01/2014  . Diabetic polyneuropathy associated with type 2 diabetes mellitus ([place]) 08/05/2014  . CKD (chronic kidney disease) stage 3, GFR 30-59 ml/min 07/21/2014  . Well controlled type 2 diabetes mellitus with nephropathy ([place]) 07/06/2014  . HTN (hypertension) 06/30/2014  . CVA (cerebral infarction) 06/29/2014  . Anemia in chronic kidney disease 11/10/2013  . Atrial fibrillation ([place]) 05/12/2013  . Mobitz type 1 second degree atrioventricular block 10/01/2012  . S/P AVR 03/24/2012  . Aortic stenosis, severe   . Hypertension   .  Junctional bradycardia   . Hyperlipidemia   . Gastrointestinal bleed   . CAD (coronary artery disease)     Orientation ACTIVITIES/SOCIAL BLADDER RESPIRATION    Self, Time, Situation, Place    Continent Normal  BEHAVIORAL SYMPTOMS/MOOD NEUROLOGICAL BOWEL NUTRITION STATUS      Continent Diet  PHYSICIAN VISITS COMMUNICATION OF NEEDS Height & Weight Skin  30 days Verbally 5\' 10"  (177.8 cm) 231 lbs. Normal          AMBULATORY STATUS RESPIRATION    Assist independent Normal      Personal Care Assistance Level of Assistance  Bathing, Dressing Bathing Assistance: Limited assistance   Dressing Assistance: Limited assistance      Functional Limitations Info  Sight, Hearing, Speech Sight Info: Adequate Hearing Info: Impaired Speech Info: Adequate       SPECIAL CARE FACTORS FREQUENCY  PT (By licensed PT)     PT Frequency: 5xweekly             Additional Factors Info  Insulin Sliding Scale       Insulin Sliding Scale Info: 3xdaily       Current Medications (04/16/2015): Current Facility-Administered Medications  Medication Dose Route Frequency Provider Last Rate Last Dose  . amLODipine (NORVASC) tablet 5 mg  5 mg Oral Daily Jonetta Osgood, MD   5 mg at 04/16/15 1004  . carvedilol (COREG) tablet 6.25 mg  6.25 mg Oral BID WC Jonetta Osgood, MD   6.25 mg at 04/16/15 0833  . cholecalciferol (VITAMIN D) tablet 1,000 Units  1,000  Units Oral Daily Jonetta Osgood, MD   1,000 Units at 04/16/15 1003  . hydrALAZINE (APRESOLINE) injection 10 mg  10 mg Intravenous Q6H PRN Jonetta Osgood, MD      . insulin aspart (novoLOG) injection 0-15 Units  0-15 Units Subcutaneous TID WC Jonetta Osgood, MD   8 Units at 04/16/15 1237  . [START ON 04/17/2015] insulin glargine (LANTUS) injection 50 Units  50 Units Subcutaneous Daily Shanker Kristeen Mans, MD      . isosorbide mononitrate (IMDUR) 24 hr tablet 15 mg  15 mg Oral Daily Jonetta Osgood, MD   15 mg at 04/16/15 1003  .  ondansetron (ZOFRAN) tablet 4 mg  4 mg Oral Q6H PRN Geradine Girt, DO       Or  . ondansetron (ZOFRAN) injection 4 mg  4 mg Intravenous Q6H PRN Geradine Girt, DO      . pantoprazole (PROTONIX) EC tablet 40 mg  40 mg Oral Q0600 Jerene Bears, MD   40 mg at 04/16/15 0534  . polyethylene glycol powder (GLYCOLAX/MIRALAX) container 127.5 g  0.5 Container Oral Once Jonetta Osgood, MD   127.5 g at 04/14/15 0100  . sodium chloride 0.9 % injection 3 mL  3 mL Intravenous Q12H Jessica U Vann, DO   3 mL at 04/16/15 1128  . tamsulosin (FLOMAX) capsule 0.4 mg  0.4 mg Oral QPC supper Jonetta Osgood, MD   0.4 mg at 04/15/15 1717   Do not use this list as official medication orders. Please verify with discharge summary.  Discharge Medications:   Medication List    ASK your doctor about these medications        carvedilol 6.25 MG tablet  Commonly known as:  COREG  Take 1 tablet (6.25 mg total) by mouth 2 (two) times daily with a meal.     VITAMIN D PO  Take 1 tablet by mouth 3 (three) times a week.     cholecalciferol 1000 UNITS tablet  Commonly known as:  VITAMIN D  Take 1,000 Units by mouth daily.     ELIQUIS 2.5 MG Tabs tablet  Generic drug:  apixaban  TAKE ONE TABLET TWICE A DAY     ferrous sulfate 325 (65 FE) MG tablet  Take 325 mg by mouth daily with breakfast.     furosemide 40 MG tablet  Commonly known as:  LASIX  Take 0.5 tablets (20 mg total) by mouth daily.     insulin aspart 100 UNIT/ML injection  Commonly known as:  NOVOLOG  use three times daily before meals  Using sliding scale     insulin glargine 100 UNIT/ML injection  Commonly known as:  LANTUS  Inject 0.5 mLs (50 Units total) into the skin daily.     Insulin Pen Needle 31G X 5 MM Misc  Use as directed     MULTI-VITAMIN PO  Take by mouth.     pantoprazole 40 MG tablet  Commonly known as:  PROTONIX  TAKE 1 TABLET EVERY DAY     PRESERVISION AREDS PO  Take 1 capsule by mouth daily.     tamsulosin 0.4 MG  Caps capsule  Commonly known as:  FLOMAX  Take 1 capsule (0.4 mg total) by mouth daily after supper.     VITAMIN C PO  Take by mouth.        Relevant Imaging Results:  Relevant Lab Results:  Recent Labs    Additional Information  Rylen Hou, Daneil Dolin, LCSW

## 2015-04-16 NOTE — Progress Notes (Signed)
PATIENT DETAILS [name] Age: 81 y.o. Sex: male Date of Birth: 1927-06-23 Admit Date: 04/12/2015 Admitting Physician Geradine Girt, DO LHT:DSKAJ, Aris Everts, MD  Subjective: No GI bleed overnight. Sleeping comfortably (flat) when I walked in.  Assessment/Plan: Active Problems: Suspected upper GI bleed: No further bleeding since admission. GI consulted-underwent endoscopy on 11/4-which . Continue PPI  Acute blood loss anemia: Secondary to above, has anemia from chronic kidney disease at baseline. Transfused 1 unit of PRBC this admit. Hemoglobin remains stable at 9.0.  On IV Iron, will resume oral Fe supplementation on d/c.  Acute on chronic diastolic heart failure: Suspect secondary to IV fluids, PRBC transfusion. Now compensated with Lasix, and much below dry weight-hold lasix today-recheck lytes in am.Continue to check daily weights, strict intake output.   Acute on Chronic kidney disease stage III: Creatinine slightly elevated than his usual baseline. Hold Lasix today-Follow and monitor closely.  Hypoglycemia: resolved.  Chronic atrial fibrillation:CHADSVasc score 7-rate currently controlled with Coreg-Eliquis on hold due to GI bleeding.Spoke with Dr Hilarie Fredrickson on 11/4-recommendations were to hold Anticoagulation for atleast 3-5 days. Cards following-consideration for watchman device.  Hypertension:Moderately controlled - Continue Coreg, amlodipine and Imdur-meds just adjusted on 11/5-follow and adjust tomorrow accordingly   History of CAD status post CABG: No chest pain. Continue Coreg  S/P AVR with bioprosthetic valve: Suspect shortness of breath is likely secondary to anemia and acute diastolic heart failure. Monitor for now.  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: Monitor in telemetry-cardiiology following  Type 2 diabetes: Hypoglycemic on admission-but CBG's now stable-will get Lantus to 30  units-today-follow  Disposition: Remain inpatient-hesitant to go to [facility] asked SW to consult.D/C tomorrow either to SNF or HHPT  Antimicrobial agents  See below  Anti-infectives    None      DVT Prophylaxis:  SCD's  Code Status: Full code   Family Communication Son over the phone  Procedures: None  CONSULTS:  GI  Time spent 20 minutes-Greater than 50% of this time was spent in counseling, explanation of diagnosis, planning of further management, and coordination of care.  MEDICATIONS: Scheduled Meds: . amLODipine  5 mg Oral Daily  . carvedilol  6.25 mg Oral BID WC  . cholecalciferol  1,000 Units Oral Daily  . insulin aspart  0-15 Units Subcutaneous TID WC  . insulin glargine  30 Units Subcutaneous Daily  . iron dextran (INFED/DEXFERRUM) infusion  250 mg Intravenous Once  . isosorbide mononitrate  15 mg Oral Daily  . pantoprazole  40 mg Oral Q0600  . polyethylene glycol powder  0.5 Container Oral Once  . sodium chloride  3 mL Intravenous Q12H  . tamsulosin  0.4 mg Oral QPC supper   Continuous Infusions:   PRN Meds:.hydrALAZINE, ondansetron **OR** ondansetron (ZOFRAN) IV    PHYSICAL EXAM: Vital signs in last 24 hours: Filed Vitals:   04/15/15 1714 04/15/15 1954 04/16/15 0348 04/16/15 0830  BP: 140/67 138/48 156/46 161/52  Pulse: 62 62 82 66  Temp:  97.8 F (36.6 C) 98 F (36.7 C)   TempSrc:  Oral Oral   Resp: 20 18 18    Height:      Weight:   104.8 kg (231 lb 0.7 oz)   SpO2: 98% 98% 98% 87%    Weight change: -2.775 kg (-6 lb 1.9 oz) Filed Weights   04/15/15 0500 04/15/15 1214 04/16/15 0348  Weight: 108.1 kg (238  lb 5.1 oz) 105.325 kg (232 lb 3.2 oz) 104.8 kg (231 lb 0.7 oz)   Body mass index is 33.15 kg/(m^2).   Gen Exam: Awake and alert with clear speech.  Neck: Supple, No JVD.   Chest: Clear anteriorly CVS: S1 S2 irregular, systolic murmur Abdomen: soft, BS +, non tender, non distended.  Extremities: no edema, lower extremities warm  to touch. Neurologic: Non Focal.  Skin: No Rash.   Wounds: N/A.   Intake/Output from previous day:  Intake/Output Summary (Last 24 hours) at 04/16/15 0852 Last data filed at 04/16/15 0829  Gross per 24 hour  Intake    483 ml  Output   2250 ml  Net  -1767 ml     LAB RESULTS: CBC  Recent Labs Lab 04/13/15 0337 04/13/15 1815 04/14/15 0723 04/15/15 0455 04/16/15 0114  WBC 7.2 6.2 7.5 6.1 5.9  HGB 9.1* 9.2* 8.9* 8.9* 9.0*  HCT 29.4* 29.8* 28.2* 28.8* 28.3*  PLT 221 225 198 232 203  MCV 84.2 84.7 84.7 84.7 84.0  MCH 26.1 26.1 26.7 26.2 26.7  MCHC 31.0 30.9 31.6 30.9 31.8  RDW 14.2 14.3 14.3 14.1 14.1    Chemistries   Recent Labs Lab 04/12/15 1606 04/13/15 0337 04/14/15 0432 04/15/15 0455 04/16/15 0114  NA 137 137 136 135 134*  K 4.9 4.5 4.7 4.3 4.8  CL 104 108 101 101 100*  CO2 24 23 21* 25 23  GLUCOSE 91 208* 207* 237* 318*  BUN 34* 27* 24* 27* 35*  CREATININE 2.06* 1.81* 1.92* 2.22* 2.32*  CALCIUM 9.1 8.7* 8.9 8.6* 8.8*    CBG:  Recent Labs Lab 04/15/15 1143 04/15/15 1708 04/15/15 2041 04/16/15 0126 04/16/15 0556  GLUCAP 371* 301* 161* 335* 383*    GFR Estimated Creatinine Clearance: 28.2 mL/min (by C-G formula based on Cr of 2.32).  Coagulation profile  Recent Labs Lab 04/12/15 1645  INR 1.20    Cardiac Enzymes No results for input(s): CKMB, TROPONINI, MYOGLOBIN in the last 168 hours.  Invalid input(s): CK  Invalid input(s): POCBNP No results for input(s): DDIMER in the last 72 hours. No results for input(s): HGBA1C in the last 72 hours. No results for input(s): CHOL, HDL, LDLCALC, TRIG, CHOLHDL, LDLDIRECT in the last 72 hours. No results for input(s): TSH, T4TOTAL, T3FREE, THYROIDAB in the last 72 hours.  Invalid input(s): FREET3  Recent Labs  04/14/15 1319  FERRITIN 34  TIBC 339  IRON 12*   No results for input(s): LIPASE, AMYLASE in the last 72 hours.  Urine Studies No results for input(s): UHGB, CRYS in the last 72  hours.  Invalid input(s): UACOL, UAPR, USPG, UPH, UTP, UGL, UKET, UBIL, UNIT, UROB, ULEU, UEPI, UWBC, URBC, UBAC, CAST, UCOM, BILUA  MICROBIOLOGY: Recent Results (from the past 240 hour(s))  MRSA PCR Screening     Status: None   Collection Time: 04/12/15  7:22 PM  Result Value Ref Range Status   MRSA by PCR NEGATIVE NEGATIVE Final    Comment:        The GeneXpert MRSA Assay (FDA approved for NASAL specimens only), is one component of a comprehensive MRSA colonization surveillance program. It is not intended to diagnose MRSA infection nor to guide or monitor treatment for MRSA infections.     RADIOLOGY STUDIES/RESULTS: Ct Abdomen Wo Contrast  04/06/2015  CLINICAL DATA:  Indeterminate exophytic renal lesion in the right kidney detected on recent sonogram. EXAM: CT ABDOMEN WITHOUT CONTRAST TECHNIQUE: Multidetector CT imaging of the abdomen was performed  following the standard protocol without IV contrast. COMPARISON:  03/15/2015 retroperitoneal sonogram. 01/09/2008 CT abdomen. FINDINGS: Lower chest: No significant pulmonary nodules or acute consolidative airspace disease. Hepatobiliary: Normal liver with no liver mass. There are layering tiny subcentimeter calcified gallstones in the nondistended gallbladder, with no gallbladder wall thickening or pericholecystic fat stranding. No biliary ductal dilatation. Pancreas: Normal, with no mass or duct dilation. Spleen: Normal size spleen. No splenic mass. Tiny vascular versus granulomatous calcifications in the medial spleen. Adrenals/Urinary Tract: Normal adrenals. There is mild fullness of the left renal collecting system without overt left hydronephrosis. No renal stones. There is an exophytic 1.3 cm lesion in the lateral interpolar right kidney with complex fluid density, which measured 1.5 cm on 01/09/2008, and is slightly decreased in size, in keeping with a benign mildly hemorrhagic/ proteinaceous Bosniak category 2 right renal cyst. No new  contour deforming right renal lesions. There is a minimally complex 3.8 cm partially exophytic renal cyst in the posterior upper left kidney, which demonstrates thin curvilinear mural calcification, which measured 5.1 cm on 01/09/2008 and is decreased in size, in keeping with a benign Bosniak category 2 left renal cyst. There is a simple 1.3 cm renal cyst in the posterior interpolar left kidney. No new contour deforming left renal lesions. Stomach/Bowel: Grossly normal stomach. Visualized small and large bowel is normal caliber, with no bowel wall thickening. Vascular/Lymphatic: Atherosclerotic nonaneurysmal abdominal aorta. No pathologically enlarged lymph nodes in the abdomen. Other: No pneumoperitoneum, ascites or focal fluid collection. Musculoskeletal: No aggressive appearing focal osseous lesions. Marked degenerative changes in the visualized thoracolumbar spine. IMPRESSION: 1. Benign Bosniak category 2 renal cysts in both kidneys, which are decreased in size since 2009. No suspicious renal lesions on this noncontrast study. 2. Mild fullness of the left renal collecting system without overt left hydronephrosis. No nephrolithiasis in the visualized portions of the urinary tracts. 3. Cholelithiasis, with no evidence of acute cholecystitis. No biliary ductal dilatation. Electronically Signed   By: Ilona Sorrel M.D.   On: 04/06/2015 14:55   Dg Chest 2 View  04/12/2015  CLINICAL DATA:  Shortness of breath, weakness. EXAM: CHEST  2 VIEW COMPARISON:  06/29/2014 FINDINGS: Prior valve replacement. Cardiomegaly. No confluent airspace opacities, effusions or edema. Degenerative changes in the thoracic spine. No acute bony abnormality. IMPRESSION: Cardiomegaly.  No active disease. Electronically Signed   By: Rolm Baptise M.D.   On: 04/12/2015 16:42   Dg Chest Port 1 View  04/14/2015  CLINICAL DATA:  Shortness of breath. EXAM: PORTABLE CHEST 1 VIEW COMPARISON:  04/12/2015. FINDINGS: Prior cardiac valve replacement.  Cardiomegaly. Mild pulmonary vascular prominence interstitial prominence consistent congestive heart failure. Small left pleural effusion. No pneumothorax . IMPRESSION: Prior cardiac valve replacement. Cardiomegaly with pulmonary vascular prominence and interstitial prominence and small left pleural effusion consistent with congestive heart failure. Pulmonary interstitial edema is new from 04/12/2015 . Electronically Signed   By: Marcello Moores  Register   On: 04/14/2015 07:16    Oren Binet, MD  [facility] Pager:[phone]  If 7PM-7AM, please contact night-coverage [email or web address] Password [email or web address] 04/16/2015, 8:52 AM   LOS: 4 days

## 2015-04-17 ENCOUNTER — Encounter (HOSPITAL_COMMUNITY): Payer: Self-pay | Admitting: Internal Medicine

## 2015-04-17 DIAGNOSIS — I482 Chronic atrial fibrillation: Secondary | ICD-10-CM | POA: Diagnosis not present

## 2015-04-17 DIAGNOSIS — R5381 Other malaise: Secondary | ICD-10-CM | POA: Diagnosis not present

## 2015-04-17 DIAGNOSIS — E1121 Type 2 diabetes mellitus with diabetic nephropathy: Secondary | ICD-10-CM | POA: Diagnosis not present

## 2015-04-17 DIAGNOSIS — N183 Chronic kidney disease, stage 3 (moderate): Secondary | ICD-10-CM | POA: Diagnosis not present

## 2015-04-17 DIAGNOSIS — I63411 Cerebral infarction due to embolism of right middle cerebral artery: Secondary | ICD-10-CM | POA: Diagnosis not present

## 2015-04-17 DIAGNOSIS — K921 Melena: Secondary | ICD-10-CM | POA: Diagnosis not present

## 2015-04-17 DIAGNOSIS — D649 Anemia, unspecified: Secondary | ICD-10-CM | POA: Diagnosis not present

## 2015-04-17 DIAGNOSIS — R2681 Unsteadiness on feet: Secondary | ICD-10-CM | POA: Diagnosis not present

## 2015-04-17 DIAGNOSIS — D509 Iron deficiency anemia, unspecified: Secondary | ICD-10-CM | POA: Diagnosis not present

## 2015-04-17 DIAGNOSIS — R278 Other lack of coordination: Secondary | ICD-10-CM | POA: Diagnosis not present

## 2015-04-17 DIAGNOSIS — I10 Essential (primary) hypertension: Secondary | ICD-10-CM | POA: Diagnosis not present

## 2015-04-17 DIAGNOSIS — M1990 Unspecified osteoarthritis, unspecified site: Secondary | ICD-10-CM | POA: Diagnosis not present

## 2015-04-17 DIAGNOSIS — I69339 Monoplegia of upper limb following cerebral infarction affecting unspecified side: Secondary | ICD-10-CM | POA: Diagnosis not present

## 2015-04-17 DIAGNOSIS — I350 Nonrheumatic aortic (valve) stenosis: Secondary | ICD-10-CM | POA: Diagnosis not present

## 2015-04-17 DIAGNOSIS — K31811 Angiodysplasia of stomach and duodenum with bleeding: Secondary | ICD-10-CM | POA: Diagnosis not present

## 2015-04-17 DIAGNOSIS — K922 Gastrointestinal hemorrhage, unspecified: Secondary | ICD-10-CM | POA: Diagnosis not present

## 2015-04-17 DIAGNOSIS — M6281 Muscle weakness (generalized): Secondary | ICD-10-CM | POA: Diagnosis not present

## 2015-04-17 DIAGNOSIS — I5041 Acute combined systolic (congestive) and diastolic (congestive) heart failure: Secondary | ICD-10-CM | POA: Diagnosis not present

## 2015-04-17 DIAGNOSIS — D62 Acute posthemorrhagic anemia: Secondary | ICD-10-CM | POA: Diagnosis not present

## 2015-04-17 DIAGNOSIS — I5032 Chronic diastolic (congestive) heart failure: Secondary | ICD-10-CM | POA: Diagnosis not present

## 2015-04-17 DIAGNOSIS — K5900 Constipation, unspecified: Secondary | ICD-10-CM | POA: Diagnosis not present

## 2015-04-17 LAB — CBC
HCT: 28.7 % — ABNORMAL LOW (ref 39.0–52.0)
Hemoglobin: 8.9 g/dL — ABNORMAL LOW (ref 13.0–17.0)
MCH: 25.9 pg — ABNORMAL LOW (ref 26.0–34.0)
MCHC: 31 g/dL (ref 30.0–36.0)
MCV: 83.4 fL (ref 78.0–100.0)
PLATELETS: 232 10*3/uL (ref 150–400)
RBC: 3.44 MIL/uL — ABNORMAL LOW (ref 4.22–5.81)
RDW: 13.9 % (ref 11.5–15.5)
WBC: 5.7 10*3/uL (ref 4.0–10.5)

## 2015-04-17 LAB — BASIC METABOLIC PANEL
Anion gap: 10 (ref 5–15)
BUN: 39 mg/dL — ABNORMAL HIGH (ref 6–20)
CALCIUM: 9 mg/dL (ref 8.9–10.3)
CO2: 26 mmol/L (ref 22–32)
CREATININE: 2.09 mg/dL — AB (ref 0.61–1.24)
Chloride: 101 mmol/L (ref 101–111)
GFR calc non Af Amer: 27 mL/min — ABNORMAL LOW (ref >60)
GFR, EST AFRICAN AMERICAN: 32 mL/min — AB (ref >60)
Glucose, Bld: 60 mg/dL — ABNORMAL LOW (ref 65–99)
Potassium: 4 mmol/L (ref 3.5–5.1)
SODIUM: 137 mmol/L (ref 135–145)

## 2015-04-17 LAB — GLUCOSE, CAPILLARY
GLUCOSE-CAPILLARY: 301 mg/dL — AB (ref 65–99)
Glucose-Capillary: 63 mg/dL — ABNORMAL LOW (ref 65–99)

## 2015-04-17 MED ORDER — FUROSEMIDE 20 MG PO TABS: 20.0000 mg | ORAL_TABLET | Freq: Every day | ORAL | Status: DC

## 2015-04-17 MED ORDER — CARVEDILOL 12.5 MG PO TABS: 12.5000 mg | ORAL_TABLET | Freq: Two times a day (BID) | ORAL | Status: DC

## 2015-04-17 MED ORDER — FUROSEMIDE 40 MG PO TABS: 20.0000 mg | ORAL_TABLET | Freq: Every day | ORAL | Status: DC

## 2015-04-17 MED ORDER — APIXABAN 2.5 MG PO TABS: 2.5000 mg | ORAL_TABLET | Freq: Two times a day (BID) | ORAL | Status: DC

## 2015-04-17 MED ORDER — ISOSORBIDE MONONITRATE ER 30 MG PO TB24: 15.0000 mg | ORAL_TABLET | Freq: Every day | ORAL | Status: DC

## 2015-04-17 MED ORDER — AMLODIPINE BESYLATE 5 MG PO TABS: 10.0000 mg | ORAL_TABLET | Freq: Every day | ORAL | Status: DC

## 2015-04-17 MED ORDER — INSULIN ASPART 100 UNIT/ML ~~LOC~~ SOLN: SUBCUTANEOUS | Status: DC

## 2015-04-17 MED ORDER — AMLODIPINE BESYLATE 5 MG PO TABS: 5.0000 mg | ORAL_TABLET | Freq: Every day | ORAL | Status: DC

## 2015-04-17 MED ORDER — ISOSORBIDE MONONITRATE ER 30 MG PO TB24: 30.0000 mg | ORAL_TABLET | Freq: Every day | ORAL | Status: DC

## 2015-04-17 MED ORDER — CARVEDILOL 12.5 MG PO TABS: 6.2500 mg | ORAL_TABLET | Freq: Two times a day (BID) | ORAL | Status: DC

## 2015-04-17 NOTE — Evaluation (Signed)
Occupational Therapy Evaluation Patient Details [name] MRN: [id number] DOB: 22-Jan-1928 Today's Date: 04/17/2015    History of Present Illness Patient is an 81 y.o. male With PMHX of atrial fib, AVR- bioprosthetic,CABG.Adm due to upper GI bleed, became anemic requiring transfusion. 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 Wife passed away this year, 10/19/22.   Clinical Impression   Patient presenting with decreased ADL and functional mobility independence secondary to decreased balance and overall awareness. Patient independent to mod I PTA. Patient currently functioning at an overall min assist level. Patient will benefit from acute OT to increase overall independence in the areas of ADLs, functional mobility, and overall safety in order to safely discharge to venue listed below.     Follow Up Recommendations  SNF;Supervision/Assistance - 24 hour    Equipment Recommendations   (TBD next venue of care)    Recommendations for Other Services  None at this time   Precautions / Restrictions Precautions Precautions: Fall Restrictions Weight Bearing Restrictions: No    Mobility Bed Mobility Overal bed mobility: Needs Assistance Bed Mobility: Supine to Sit     Supine to sit: Supervision     General bed mobility comments: supervision for safety  Transfers Overall transfer level: Needs assistance Equipment used: Rolling walker (2 wheeled) Transfers: Sit to/from Stand Sit to Stand: Min guard General transfer comment: cues for RW safety, min guard needed for safety    Balance Overall balance assessment: Needs assistance Sitting-balance support: No upper extremity supported Sitting balance-[name]: Good     Standing balance support: Bilateral upper extremity supported;During functional activity Standing balance-[name]: Fair    ADL Overall ADL's : Needs assistance/impaired Eating/Feeding: Set up;Sitting Eating/Feeding Details (indicate cue  type and reason): pt unable to open small containers due to decreased fine motor control/coordination Grooming: Supervision/safety;Standing   Upper Body Bathing: Sitting;Minimal assitance   Lower Body Bathing: Minimal assistance;Sit to/from stand   Upper Body Dressing : Minimal assistance;Sitting   Lower Body Dressing: Minimal assistance;Sit to/from stand   Toilet Transfer: Minimal assistance;RW;Ambulation    Functional mobility during ADLs: Minimal assistance;Cueing for safety;Rolling walker General ADL Comments: Pt with residual left sided weakness secondary to CVAjanuary 2016. Pt with difficulty reaching BLEs and reports he has a sock aid at home and a button hook for shirts. Pt with difficulty opening small containers for meals and requires assistance with this.     Vision Additional Comments: Pt will benefit from education on compensatory strategies for safety with macular degeneration          Pertinent Vitals/Pain Pain Assessment: No/denies pain     Hand Dominance Right   Extremity/Trunk Assessment Upper Extremity Assessment Upper Extremity Assessment: Generalized weakness;LUE deficits/detail LUE Deficits / Details: deficts from CVA 2016, residual left sided weakness   Lower Extremity Assessment Lower Extremity Assessment: Defer to PT evaluation   Cervical / Trunk Assessment Cervical / Trunk Assessment: Normal   Communication Communication Communication: No difficulties   Cognition Arousal/Alertness: Awake/alert Behavior During Therapy: WFL for tasks assessed/performed Overall Cognitive Status: Within Functional Limits for tasks assessed              Home Living Family/patient expects to be discharged to:: Private residence Living Arrangements: Alone Available Help at Discharge: Family;Available PRN/intermittently Type of Home: House Home Access: Stairs to enter [employer] of Steps: 3 Entrance Stairs-Rails: Left Home Layout: Two  level;Bed/bath upstairs Alternate Level Stairs-Number of Steps: flight (16) Alternate Level Stairs-Rails: Left Bathroom Shower/Tub: Tub/shower unit;Walk-in shower;Door;Curtain  Bathroom Toilet: Programmer, systems: Yes   Home Equipment: Cane - single point;Walker - 2 wheels   Additional Comments: Pt has grab bars for toilet and shower. Pt has walk in shower and tub/shower combo. He owns shower chair.  Pt was using a cane or a RW (when feeling unsteady) when going outside of the house      Prior Functioning/Environment Level of Independence: Independent with assistive device(s)  Comments: son is MD; was a pilot     OT Diagnosis: Generalized weakness;Disturbance of vision   OT Problem List: Decreased strength;Decreased activity tolerance;Impaired balance (sitting and/or standing);Impaired vision/perception;Decreased safety awareness;Decreased knowledge of use of DME or AE   OT Treatment/Interventions: Self-care/ADL training;Therapeutic exercise;Energy conservation;DME and/or AE instruction;Therapeutic activities;Patient/family education;Balance training    OT Goals(Current goals can be found in the care plan section) Acute Rehab OT Goals Patient Stated Goal: to go home, but willing to go to SNF OT Goal Formulation: With patient Time For Goal Achievement: 05/01/15 Potential to Achieve Goals: Good ADL Goals Pt Will Perform Grooming: with modified independence;standing Pt Will Perform Upper Body Bathing: with modified independence;sitting Pt Will Perform Lower Body Bathing: with modified independence;sit to/from stand Pt Will Perform Upper Body Dressing: with modified independence;sitting Pt Will Perform Lower Body Dressing: with modified independence;sit to/from stand Pt Will Transfer to Toilet: with modified independence;ambulating;bedside commode Pt/caregiver will Perform Home Exercise Program: Increased ROM;Increased strength;Left upper extremity;With Supervision;With  written HEP provided  OT Frequency: Min 2X/week   Barriers to D/C: Decreased caregiver support   End of Session Equipment Utilized During Treatment: Gait belt;Rolling walker  Activity Tolerance: Patient tolerated treatment well Patient left: in chair;with call bell/phone within reach;with chair alarm set   Time: [id number] OT Time Calculation (min): 24 min Charges:  OT General Charges $OT Visit: 1 Procedure OT Evaluation $Initial OT Evaluation Tier I: 1 Procedure OT Treatments $Therapeutic Activity: 8-22 mins  Dorsey Charette , MS, OTR/L, CLT  04/17/2015, 9:33 AM

## 2015-04-17 NOTE — Care Management Important Message (Signed)
Important Message  Patient Details  [name] MRN: [id number] Date of Birth: [birth year]   Medicare Important Message Given:  Yes-third notification given    [name] 04/17/2015, 1:36 PM

## 2015-04-17 NOTE — Progress Notes (Signed)
PATIENT DETAILS [name] Age: 81 y.o. Sex: male Date of Birth: 21-Aug-1927 MRN: [id number]. Admitting Physician: Geradine Girt, DO [id number], Aris Everts, MD  Admit Date: 04/12/2015 Discharge date: 04/17/2015  Recommendations for Outpatient Follow-up:  1. Repeat CBC and chemistries in 1 week 2. Resume Lasix ONLY-if patient's weight is around 240 pounds (230 pounds on discharge)  3. 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 Aortic stenosis, severe   . Hypertension   . Diabetes mellitus   . OA (osteoarthritis)   . Junctional bradycardia   . BPH (benign prostatic hypertrophy)   . Hyperlipidemia   . Anemia   . Gastrointestinal bleed   . CAD (coronary artery disease)   . Mobitz type 1 second degree atrioventricular block 10/01/2012  . History of bladder cancer   . Macular degeneration     DISCHARGE MEDICATIONS: Current Discharge Medication List    START taking these medications   Details  amLODipine (NORVASC) 5 MG tablet Take 2 tablets (10 mg total) by mouth daily.    !! insulin aspart (NOVOLOG) 100 UNIT/ML injection 0-15 Units, Subcutaneous, 3 times daily with meals CBG < 70: implement hypoglycemia protocol CBG 70 - 120: 0 units CBG 121 - 150: 2 units CBG 151 - 200: 3 units CBG 201 - 250: 5 units CBG 251 -  300: 8 units CBG 301 - 350: 11 units CBG 351 - 400: 15 units CBG > 400: call MD    isosorbide mononitrate (IMDUR) 30 MG 24 hr tablet Take 1 tablet (30 mg total) by mouth daily. Qty: 30 tablet, Refills: 0     !! - Potential duplicate medications found. Please discuss with provider.    CONTINUE these medications which have CHANGED   Details  apixaban (ELIQUIS) 2.5 MG TABS tablet Take 1 tablet (2.5 mg total) by mouth 2 (two) times daily. Start on 04/20/15    carvedilol (COREG) 12.5 MG tablet Take 0.5 tablets (6.25 mg total) by mouth 2 (two) times daily with a meal.    furosemide (LASIX) 40 MG tablet Take 0.5 tablets (20 mg total) by mouth daily. Start once weight is around 240 pounds      CONTINUE these medications which have NOT CHANGED   Details  Ascorbic Acid (VITAMIN C PO) Take by mouth.      !! Cholecalciferol (VITAMIN D PO) Take 1 tablet by mouth 3 (three) times a week.    !! cholecalciferol (VITAMIN D) 1000 UNITS tablet Take 1,000 Units by mouth daily.      ferrous sulfate 325 (65 FE) MG tablet Take 325 mg by mouth daily with breakfast.    !! insulin aspart (NOVOLOG) 100 UNIT/ML injection use three times daily before meals  Using sliding scale Qty: 10 mL, Refills: 11    insulin glargine (LANTUS) 100  UNIT/ML injection Inject 0.5 mLs (50 Units total) into the skin daily. Qty: 10 mL, Refills: 11    Multiple Vitamin (MULTI-VITAMIN PO) Take by mouth.      Multiple Vitamins-Minerals (PRESERVISION AREDS PO) Take 1 capsule by mouth daily.    pantoprazole (PROTONIX) 40 MG tablet TAKE 1 TABLET EVERY DAY Qty: 30 tablet, Refills: 11    tamsulosin (FLOMAX) 0.4 MG CAPS capsule Take 1 capsule (0.4 mg total) by mouth daily after supper. Qty: 30 capsule, Refills: 1    Insulin Pen Needle 31G X 5 MM MISC Use as directed Qty: 100 each, Refills: 5     !! - Potential duplicate medications found. Please discuss with provider.      ALLERGIES:   Allergies  Allergen Reactions  . Asa  [Aspirin]   . Metoprolol     bradycardia  . Penicillins Swelling         BRIEF HPI:  See H&P, Labs, Consult and Test reports for all details in brief, patient is a 81 year old male with history of atrial fibrillation on anticoagulation who presented with worsening exertional dyspnea and fatigue, he subsequently give a history of melena. He was subsequently admitted to the hospital for further evaluation and treatment.   CONSULTATIONS:   cardiology and GI  PERTINENT RADIOLOGIC STUDIES: Ct Abdomen Wo Contrast  May 05, 2015  CLINICAL DATA:  Indeterminate exophytic renal lesion in the right kidney detected on recent sonogram. EXAM: CT ABDOMEN WITHOUT CONTRAST TECHNIQUE: Multidetector CT imaging of the abdomen was performed following the standard protocol without IV contrast. COMPARISON:  03/15/2015 retroperitoneal sonogram. 01/09/2008 CT abdomen. FINDINGS: Lower chest: No significant pulmonary nodules or acute consolidative airspace disease. Hepatobiliary: Normal liver with no liver mass. There are layering tiny subcentimeter calcified gallstones in the nondistended gallbladder, with no gallbladder wall thickening or pericholecystic fat stranding. No biliary ductal dilatation. Pancreas: Normal, with no mass or duct dilation. Spleen: Normal size spleen. No splenic mass. Tiny vascular versus granulomatous calcifications in the medial spleen. Adrenals/Urinary Tract: Normal adrenals. There is mild fullness of the left renal collecting system without overt left hydronephrosis. No renal stones. There is an exophytic 1.3 cm lesion in the lateral interpolar right kidney with complex fluid density, which measured 1.5 cm on 01/09/2008, and is slightly decreased in size, in keeping with a benign mildly hemorrhagic/ proteinaceous Bosniak category 2 right renal cyst. No new contour deforming right renal lesions. There is a minimally complex 3.8 cm partially exophytic renal cyst in the posterior upper left kidney,  which demonstrates thin curvilinear mural calcification, which measured 5.1 cm on 01/09/2008 and is decreased in size, in keeping with a benign Bosniak category 2 left renal cyst. There is a simple 1.3 cm renal cyst in the posterior interpolar left kidney. No new contour deforming left renal lesions. Stomach/Bowel: Grossly normal stomach. Visualized small and large bowel is normal caliber, with no bowel wall thickening. Vascular/Lymphatic: Atherosclerotic nonaneurysmal abdominal aorta. No pathologically enlarged lymph nodes in the abdomen. Other: No pneumoperitoneum, ascites or focal fluid collection. Musculoskeletal: No aggressive appearing focal osseous lesions. Marked degenerative changes in the visualized thoracolumbar spine. IMPRESSION: 1. Benign Bosniak category 2 renal cysts in both kidneys, which are decreased in size since 2009. No suspicious renal lesions on this noncontrast study. 2. Mild fullness of the left renal collecting system without overt left hydronephrosis. No nephrolithiasis in the visualized portions of the urinary tracts. 3. Cholelithiasis, with no evidence of acute cholecystitis. No biliary ductal dilatation. Electronically Signed   By: Rinaldo Ratel  Poff M.D.   On: 04/06/2015 14:55   Dg Chest 2 View  04/12/2015  CLINICAL DATA:  Shortness of breath, weakness. EXAM: CHEST  2 VIEW COMPARISON:  06/29/2014 FINDINGS: Prior valve replacement. Cardiomegaly. No confluent airspace opacities, effusions or edema. Degenerative changes in the thoracic spine. No acute bony abnormality. IMPRESSION: Cardiomegaly.  No active disease. Electronically Signed   By: Rolm Baptise M.D.   On: 04/12/2015 16:42   Dg Chest Port 1 View  04/14/2015  CLINICAL DATA:  Shortness of breath. EXAM: PORTABLE CHEST 1 VIEW COMPARISON:  04/12/2015. FINDINGS: Prior cardiac valve replacement. Cardiomegaly. Mild pulmonary vascular prominence interstitial prominence consistent congestive heart failure. Small left pleural effusion. No  pneumothorax . IMPRESSION: Prior cardiac valve replacement. Cardiomegaly with pulmonary vascular prominence and interstitial prominence and small left pleural effusion consistent with congestive heart failure. Pulmonary interstitial edema is new from 04/12/2015 . Electronically Signed   By: Marcello Moores  Register   On: 04/14/2015 07:16     PERTINENT LAB RESULTS: CBC:  Recent Labs  04/16/15 0114 04/17/15 0255  WBC 5.9 5.7  HGB 9.0* 8.9*  HCT 28.3* 28.7*  PLT 203 232   CMET CMP     Component Value Date/Time   NA 137 04/17/2015 0255   NA 140 03/06/2015   NA 138 06/29/2014 0451   K 4.0 04/17/2015 0255   K 4.4 09/28/2014 1037   CL 101 04/17/2015 0255   CL 104 06/29/2014 0451   CO2 26 04/17/2015 0255   CO2 27 06/29/2014 0451   GLUCOSE 60* 04/17/2015 0255   GLUCOSE 151* 06/29/2014 0451   GLUCOSE 83 05/12/2013 1631   BUN 39* 04/17/2015 0255   BUN 41* 03/06/2015   BUN 37* 06/29/2014 0451   CREATININE 2.09* 04/17/2015 0255   CREATININE 1.9* 03/06/2015   CREATININE 1.60* 06/29/2014 0451   CALCIUM 9.0 04/17/2015 0255   CALCIUM 8.8 06/29/2014 0451   PROT 6.4* 04/12/2015 1645   PROT 7.3 08/09/2012 1257   PROT 6.6 12/18/2011 1433   ALBUMIN 3.5 04/12/2015 1645   ALBUMIN 3.6 08/09/2012 1257   ALBUMIN 4.2 12/18/2011 1433   AST 27 04/12/2015 1645   AST 24 08/09/2012 1257   ALT 16* 04/12/2015 1645   ALT 23 08/09/2012 1257   ALKPHOS 94 04/12/2015 1645   ALKPHOS 104 08/09/2012 1257   BILITOT 0.6 04/12/2015 1645   BILITOT 0.6 08/09/2012 1257   GFRNONAA 27* 04/17/2015 0255   GFRNONAA 44* 06/29/2014 0451   GFRNONAA 33* 11/08/2013 1411   GFRAA 32* 04/17/2015 0255   GFRAA 53* 06/29/2014 0451   GFRAA 38* 11/08/2013 1411    GFR Estimated Creatinine Clearance: 31.3 mL/min (by C-G formula based on Cr of 2.09). No results for input(s): LIPASE, AMYLASE in the last 72 hours. No results for input(s): CKTOTAL, CKMB, CKMBINDEX, TROPONINI in the last 72 hours. Invalid input(s): POCBNP No  results for input(s): DDIMER in the last 72 hours. No results for input(s): HGBA1C in the last 72 hours. No results for input(s): CHOL, HDL, LDLCALC, TRIG, CHOLHDL, LDLDIRECT in the last 72 hours. No results for input(s): TSH, T4TOTAL, T3FREE, THYROIDAB in the last 72 hours.  Invalid input(s): FREET3  Recent Labs  04/14/15 1319  FERRITIN 34  TIBC 339  IRON 12*   Coags: No results for input(s): INR in the last 72 hours.  Invalid input(s): PT Microbiology: Recent Results (from the past 240 hour(s))  MRSA PCR Screening     Status: None   Collection Time: 04/12/15  7:22 PM  Result Value Ref Range Status   MRSA by PCR NEGATIVE NEGATIVE Final    Comment:        The GeneXpert MRSA Assay (FDA approved for NASAL specimens only), is one component of a comprehensive MRSA colonization surveillance program. It is not intended to diagnose MRSA infection nor to guide or monitor treatment for MRSA infections.      BRIEF HOSPITAL COURSE:  Upper GI bleed: No further bleeding since admission. GI consulted-underwent endoscopy on 11/4-which showed angiodysplastic lesions that were ablated. Remains stable with stable hemoglobin. Continue PPI on discharge, follow with gastroenterology if needed.  Acute blood loss anemia: Secondary to above, has anemia from chronic kidney disease at baseline. Transfused 1 unit of PRBC this admit. Hemoglobin remains stable at 8.9. Given IV Iron while inpatient, will resume oral Fe supplementation on d/c.  Acute on chronic diastolic heart failure: Suspect secondary to IV fluids, PRBC transfusion. Now compensated with numerous doses Lasix, current weight of 230 pounds is significantly below his dry weight of around 240 pounds. After discussing with patient and family-we will hold Lasix and allow his weight to rise to around 240 pounds before resuming. Please check weights closely on a daily basis at SNF, and resume Lasix accordingly. Once on Lasix, please check  electrolytes closely.    Acute on Chronic kidney disease stage III:  did develop mild worsening of renal function, likely secondary to Lasix. Lasix held, creatinine back down to usual baseline of around 2. Appears significantly below his dry weight-hence holding Lasix-see above.  Hypoglycemia: resolved.  Chronic atrial fibrillation:CHADSVasc score 7-rate currently controlled with Coreg-Eliquis was placed on hold due to GI bleeding.Spoke with Dr Hilarie Fredrickson on 11/4-recommendations were to hold Anticoagulation for atleast 3-5 days. Cards following-consideration for watchman device. After discussion with patient and family, recommendations are to resume anticoagulation on 04/20/15. Patient is aware of risks of recurrent GI bleed, if patient were to have another episode of GI bleeding-may need to permanently discontinue anticoagulation. Please ensure follow-up with cardiology.  Hypertension:Moderately controlled -continue Coreg 6.25 mg twice a day, increase amlodipine to 10 mg, and Imdur 30 mg on discharge. Will need further optimization of antihypertensive regimen on discharge.    History of CAD status post CABG: No chest pain. Continue Coreg  S/P AVR with bioprosthetic valve: Suspect shortness of breath is likely secondary to anemia and acute diastolic heart failure. Monitor closely in the outpatient setting   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  Type 2 diabetes: Hypoglycemic on admission-but CBG's now stable-Continue Lantus to 50 units-follow closely and adjust in the outpatient setting   TODAY-DAY OF DISCHARGE:  Subjective:   Mertie Moores today has no headache,no chest abdominal pain,no new weakness tingling or numbness, feels much better wants to go home today.   Objective:   Blood pressure 165/67, pulse 76, temperature 98.4 F (36.9 C), temperature source Oral, resp. rate 18, height 5\' 10"  (1.778 m), weight 104.645 kg (230 lb 11.2 oz), SpO2 98  %.  Intake/Output Summary (Last 24 hours) at 04/17/15 0950 Last data filed at 04/17/15 0843  Gross per 24 hour  Intake   1323 ml  Output   2375 ml  Net  -1052 ml   Filed Weights   04/15/15 1214 04/16/15 0348 04/17/15 0426  Weight: 105.325 kg (232 lb 3.2 oz) 104.8 kg (231 lb 0.7 oz) 104.645 kg (230 lb 11.2 oz)    Exam Awake Alert, Oriented *3, No new F.N deficits, Normal affect [place].AT,PERRAL Supple Neck,No  JVD, No cervical lymphadenopathy appriciated.  Symmetrical Chest wall movement, Good air movement bilaterally, CTAB RRR,No Gallops,Rubs or new Murmurs, No Parasternal Heave +ve B.Sounds, Abd Soft, Non tender, No organomegaly appriciated, No rebound -guarding or rigidity. No Cyanosis, Clubbing or edema, No new Rash or bruise  DISCHARGE CONDITION: Stable  DISPOSITION: SNF  DISCHARGE INSTRUCTIONS:    Activity:  As tolerated with Full fall precautions use walker/cane & assistance as needed  Get Medicines reviewed and adjusted: Please take all your medications with you for your next visit with your Primary MD  Please request your Primary MD to go over all hospital tests and procedure/radiological results at the follow up, please ask your Primary MD to get all Hospital records sent to his/her office.  If you experience worsening of your admission symptoms, develop shortness of breath, life threatening emergency, suicidal or homicidal thoughts you must seek medical attention immediately by calling 911 or calling your MD immediately  if symptoms less severe.  You must read complete instructions/literature along with all the possible adverse reactions/side effects for all the Medicines you take and that have been prescribed to you. Take any new Medicines after you have completely understood and accpet all the possible adverse reactions/side effects.   Do not drive when taking Pain medications.   Do not take more than prescribed Pain, Sleep and Anxiety Medications  Special  Instructions: If you have smoked or chewed Tobacco  in the last 2 yrs please stop smoking, stop any regular Alcohol  and or any Recreational drug use.  Wear Seat belts while driving.  Please note  You were cared for by a hospitalist during your hospital stay. Once you are discharged, your primary care physician will handle any further medical issues. Please note that NO REFILLS for any discharge medications will be authorized once you are discharged, as it is imperative that you return to your primary care physician (or establish a relationship with a primary care physician if you do not have one) for your aftercare needs so that they can reassess your need for medications and monitor your lab values.   Diet recommendation: Diabetic Diet Heart Healthy diet Fluid restriction 1.5 lit/day  Discharge Instructions    (HEART FAILURE PATIENTS) Call MD:  Anytime you have any of the following symptoms: 1) 3 pound weight gain in 24 hours or 5 pounds in 1 week 2) shortness of breath, with or without a dry hacking cough 3) swelling in the hands, feet or stomach 4) if you have to sleep on extra pillows at night in order to breathe.    Complete by:  As directed      Call MD for:  difficulty breathing, headache or visual disturbances    Complete by:  As directed      Call MD for:    Complete by:  As directed   Hematochezia or melena     Diet - low sodium heart healthy    Complete by:  As directed      Diet Carb Modified    Complete by:  As directed      Increase activity slowly    Complete by:  As directed            Follow-up Information    Follow up with [name], MD. Schedule an appointment as soon as possible for a visit in 2 weeks.   Specialty:  Internal Medicine   Contact information:   [address] [place] [address] [phone]  Follow up with Ida Rogue, MD. Schedule an appointment as soon as possible for a visit in 2 weeks.   Specialty:   Cardiology   Contact information:   [place] [address] [phone]       Schedule an appointment as soon as possible for a visit with PYRTLE, Lajuan Lines, MD.   Specialty:  Gastroenterology   Why:  As needed-if recurrent GI bleed   Contact information:   [address] [address] [phone]       Total Time spent on discharge equals  45 minutes.  SignedOren Binet 04/17/2015 9:50 AM

## 2015-04-17 NOTE — Clinical Social Work Placement (Signed)
   CLINICAL SOCIAL WORK PLACEMENT  NOTE  Date:  04/17/2015  Patient Details  [name] MRN: [id number] Date of Birth: 11-Mar-1928  Clinical Social Work is seeking post-discharge placement for this patient at the [facility] level of care (*CSW will initial, date and re-position this form in  chart as items are completed):  Yes   Patient/family provided with [facility] Work Department's list of facilities offering this level of care within the geographic area requested by the patient (or if unable, by the patient's family).  Yes   Patient/family informed of their freedom to choose among providers that offer the needed level of care, that participate in Medicare, Medicaid or managed care program needed by the patient, have an available bed and are willing to accept the patient.  Yes   Patient/family informed of [facility]'s ownership interest in [facility] and [facility], as well as of the fact that they are under no obligation to receive care at these facilities.  PASRR submitted to EDS on       PASRR number received on       Existing PASRR number confirmed on 04/17/15     FL2 transmitted to all facilities in geographic area requested by pt/family on 04/17/15     FL2 transmitted to all facilities within larger geographic area on       Patient informed that his/her managed care company has contracts with or will negotiate with certain facilities, including the following:        Yes   Patient/family informed of bed offers received.  Patient chooses bed at [facility]     Physician recommends and patient chooses bed at      Patient to be transferred to [facility] on 04/17/15.  Patient to be transferred to facility by Car with family     Patient family notified on 04/17/15 of transfer.  Name of family member notified:  Son:  Dr. Mertie Moores, Brooke Bonito. and Casilda Carls- daughter-in-law     PHYSICIAN Please prepare priority  discharge summary, including medications, Please sign FL2, Please prepare prescriptions     Additional Comment: DC today to SNF for short term rehab. Patient is very anxious to return home after rehab- however, his son has concerns about his father's continued ability to remain independent at home as he has not managed well in the recent past.  SNF bed chosen at [facility] as they were able to offer a private room for patient. Daughter-in-law transported patient via car.  Nursing notified to call report to SNF; no further CSW needs identified.. DC summary sent to facility for review.  Lorie Phenix. Murrell Redden [phone]      _______________________________________________ Williemae Area, LCSW 04/17/2015, 9:46 PM

## 2015-04-17 NOTE — Progress Notes (Signed)
Physical Therapy Treatment Patient Details [name] MRN: [id number] DOB: Apr 18, 1928 Today's Date: 04/17/2015    History of Present Illness Patient is an 81 y.o. male With PMHX of atrial fib, AVR- bioprosthetic,CABG.Adm due to upper GI bleed, became anemic requiring transfusion. 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 Wife passed away this year, 10-22-22.    PT Comments    Pt tolerating increased ambulation distance. He walked 220' with RW and supervision, HR 78, 2/4 dyspnea. Progressing well with mobility, however not yet at baseline.   Follow Up Recommendations  SNF;Supervision for mobility/OOB (if patient can have 24 hr supervision will need HHPT )     Equipment Recommendations  None recommended by PT    Recommendations for Other Services OT consult     Precautions / Restrictions Precautions Precautions: Fall Restrictions Weight Bearing Restrictions: No    Mobility  Bed Mobility Overal bed mobility: Needs Assistance Bed Mobility: Supine to Sit     Supine to sit: Supervision     General bed mobility comments: NT -up in recliner  Transfers Overall transfer level: Needs assistance Equipment used: Rolling walker (2 wheeled) Transfers: Sit to/from Stand Sit to Stand: Min guard         General transfer comment: cues for RW safety, min guard needed for safety  Ambulation/Gait Ambulation/Gait assistance: Supervision Ambulation Distance (Feet): 220 Feet Assistive device: Rolling walker (2 wheeled) Gait Pattern/deviations: Step-through pattern   Gait velocity interpretation: at or above normal speed for age/gender General Gait Details: steady with RW, no LOB, cues to lift head; HR 78 walking, 75 at rest, 2/4 dyspnea with walking (pt reports this is new since onset of dyspnea, he notes increased activity tolerance since admission)   Stairs            Wheelchair Mobility    Modified Rankin (Stroke Patients Only)       Balance  Overall balance assessment: Needs assistance Sitting-balance support: No upper extremity supported Sitting balance-[name]: Good     Standing balance support: Bilateral upper extremity supported;During functional activity Standing balance-[name]: Fair                      Cognition Arousal/Alertness: Awake/alert Behavior During Therapy: WFL for tasks assessed/performed Overall Cognitive Status: Within Functional Limits for tasks assessed                      Exercises      General Comments        Pertinent Vitals/Pain Pain Assessment: No/denies pain    Home Living Family/patient expects to be discharged to:: Private residence Living Arrangements: Alone Available Help at Discharge: Family;Available PRN/intermittently Type of Home: House Home Access: Stairs to enter Entrance Stairs-Rails: Left Home Layout: Two level;Bed/bath upstairs Home Equipment: Cane - single point;Walker - 2 wheels Additional Comments: Pt has grab bars for toilet and shower. Pt has walk in shower and tub/shower combo. He owns shower chair.  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; was a pilot    PT Goals (current goals can now be found in the care plan section) Acute Rehab PT Goals Patient Stated Goal: to go home, but willing to go to SNF Time For Goal Achievement: 04/29/15 Potential to Achieve Goals: Good Progress towards PT  goals: Progressing toward goals    Frequency  Min 3X/week    PT Plan Current plan remains appropriate    Co-evaluation             End of Session Equipment Utilized During Treatment: Gait belt Activity Tolerance: Patient limited by fatigue Patient left: in chair;with call bell/phone within reach;with chair alarm set     Time: 1125-1149 PT Time Calculation (min) (ACUTE ONLY): 24  min  Charges:  $Gait Training: 8-22 mins $Therapeutic Activity: 8-22 mins                    G Codes:      Philomena Doheny 04/17/2015, 11:53 AM [phone]

## 2015-04-18 ENCOUNTER — Inpatient Hospital Stay: Payer: Medicare Other | Attending: Family Medicine

## 2015-04-18 ENCOUNTER — Non-Acute Institutional Stay (SKILLED_NURSING_FACILITY): Payer: Medicare Other | Admitting: Internal Medicine

## 2015-04-18 DIAGNOSIS — I482 Chronic atrial fibrillation, unspecified: Secondary | ICD-10-CM

## 2015-04-18 DIAGNOSIS — N183 Chronic kidney disease, stage 3 unspecified: Secondary | ICD-10-CM

## 2015-04-18 DIAGNOSIS — I10 Essential (primary) hypertension: Secondary | ICD-10-CM

## 2015-04-18 DIAGNOSIS — D62 Acute posthemorrhagic anemia: Secondary | ICD-10-CM | POA: Diagnosis not present

## 2015-04-18 DIAGNOSIS — I5032 Chronic diastolic (congestive) heart failure: Secondary | ICD-10-CM

## 2015-04-18 DIAGNOSIS — K922 Gastrointestinal hemorrhage, unspecified: Secondary | ICD-10-CM

## 2015-04-18 DIAGNOSIS — R5381 Other malaise: Secondary | ICD-10-CM

## 2015-04-18 DIAGNOSIS — E1121 Type 2 diabetes mellitus with diabetic nephropathy: Secondary | ICD-10-CM

## 2015-04-18 DIAGNOSIS — D509 Iron deficiency anemia, unspecified: Secondary | ICD-10-CM | POA: Insufficient documentation

## 2015-04-18 LAB — GLUCOSE, CAPILLARY: GLUCOSE-CAPILLARY: 263 mg/dL — AB (ref 65–99)
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
## 2015-04-19 LAB — CBC AND DIFFERENTIAL: HEMOGLOBIN: 8.7 g/dL — AB (ref 13.5–17.5)

## 2015-04-19 NOTE — Discharge Summary (Signed)
PATIENT DETAILS [name] Age: 81 y.o. Sex: male Date of Birth: Jan 12, 1928 MRN: [id number]. Admitting Physician: Geradine Girt, DO JGG:EZMOQ, Aris Everts, MD  Admit Date: 04/12/2015 Discharge date: 04/17/2015  Recommendations for Outpatient Follow-up:  1. Repeat CBC and chemistries in 1 week 2. Resume Lasix ONLY-if patient's weight is around 240 pounds (230 pounds on discharge)  3. 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 Aortic stenosis, severe   . Hypertension   . Diabetes mellitus   . OA (osteoarthritis)   . Junctional bradycardia   . BPH (benign prostatic hypertrophy)   . Hyperlipidemia   . Anemia   . Gastrointestinal bleed   . CAD (coronary artery disease)   . Mobitz type 1 second degree atrioventricular block 10/01/2012  . History of bladder cancer   . Macular degeneration     DISCHARGE MEDICATIONS: Current Discharge Medication List    START taking these medications   Details  amLODipine (NORVASC) 5 MG tablet Take 2 tablets (10 mg total) by mouth daily.    !! insulin aspart (NOVOLOG) 100 UNIT/ML injection 0-15 Units, Subcutaneous, 3 times daily with meals CBG < 70: implement hypoglycemia protocol CBG 70 - 120: 0 units CBG 121 - 150: 2 units CBG 151 -  200: 3 units CBG 201 - 250: 5 units CBG 251 - 300: 8 units CBG 301 - 350: 11 units CBG 351 - 400: 15 units CBG > 400: call MD    isosorbide mononitrate (IMDUR) 30 MG 24 hr tablet Take 1 tablet (30 mg total) by mouth daily. Qty: 30 tablet, Refills: 0    !! - Potential duplicate medications found. Please discuss with provider.    CONTINUE these medications which have CHANGED   Details  apixaban (ELIQUIS) 2.5 MG TABS tablet Take 1 tablet (2.5 mg total) by mouth 2 (two) times daily. Start on 04/20/15    carvedilol (COREG) 12.5 MG tablet Take 0.5 tablets (6.25 mg total) by mouth 2 (two) times daily with a meal.    furosemide (LASIX) 40 MG tablet Take 0.5 tablets (20 mg total) by mouth daily. Start once weight is around 240 pounds      CONTINUE these medications which have NOT CHANGED   Details  Ascorbic Acid (VITAMIN C PO) Take by mouth.     !! Cholecalciferol (VITAMIN D PO) Take 1 tablet by mouth 3 (three) times a week.    !! cholecalciferol (VITAMIN D) 1000 UNITS tablet Take 1,000 Units by mouth daily.     ferrous sulfate 325 (65 FE) MG tablet Take 325 mg by mouth daily with breakfast.    !! insulin aspart (NOVOLOG) 100 UNIT/ML injection use three times daily before meals Using sliding scale Qty: 10 mL, Refills: 11    insulin glargine (LANTUS) 100 UNIT/ML injection Inject 0.5 mLs (50 Units total) into the skin daily. Qty: 10 mL, Refills: 11    Multiple Vitamin (  MULTI-VITAMIN PO) Take by mouth.     Multiple Vitamins-Minerals (PRESERVISION AREDS PO) Take 1 capsule by mouth daily.    pantoprazole (PROTONIX) 40 MG tablet TAKE 1 TABLET EVERY DAY Qty: 30 tablet, Refills: 11    tamsulosin (FLOMAX) 0.4 MG CAPS capsule Take 1 capsule (0.4 mg total) by mouth daily after supper. Qty: 30 capsule, Refills: 1    Insulin Pen Needle 31G X 5 MM MISC Use as directed Qty: 100 each, Refills: 5    !! - Potential duplicate  medications found. Please discuss with provider.      ALLERGIES:  Allergies  Allergen Reactions  . Asa [Aspirin]   . Metoprolol     bradycardia  . Penicillins Swelling         BRIEF HPI: See H&P, Labs, Consult and Test reports for all details in brief, patient is a 81 year old male with history of atrial fibrillation on anticoagulation who presented with worsening exertional dyspnea and fatigue, he subsequently give a history of melena. He was subsequently admitted to the hospital for further evaluation and treatment.   CONSULTATIONS:  cardiology and GI  PERTINENT RADIOLOGIC STUDIES:  Imaging Results    Ct Abdomen Wo Contrast  04/06/2015 CLINICAL DATA: Indeterminate exophytic renal lesion in the right kidney detected on recent sonogram. EXAM: CT ABDOMEN WITHOUT CONTRAST TECHNIQUE: Multidetector CT imaging of the abdomen was performed following the standard protocol without IV contrast. COMPARISON: 03/15/2015 retroperitoneal sonogram. 01/09/2008 CT abdomen. FINDINGS: Lower chest: No significant pulmonary nodules or acute consolidative airspace disease. Hepatobiliary: Normal liver with no liver mass. There are layering tiny subcentimeter calcified gallstones in the nondistended gallbladder, with no gallbladder wall thickening or pericholecystic fat stranding. No biliary ductal dilatation. Pancreas: Normal, with no mass or duct dilation. Spleen: Normal size spleen. No splenic mass. Tiny vascular versus granulomatous calcifications in the medial spleen. Adrenals/Urinary Tract: Normal adrenals. There is mild fullness of the left renal collecting system without overt left hydronephrosis. No renal stones. There is an exophytic 1.3 cm lesion in the lateral interpolar right kidney with complex fluid density, which measured 1.5 cm on 01/09/2008, and is slightly decreased in size, in keeping with a benign mildly hemorrhagic/ proteinaceous Bosniak category 2 right renal cyst.  No new contour deforming right renal lesions. There is a minimally complex 3.8 cm partially exophytic renal cyst in the posterior upper left kidney, which demonstrates thin curvilinear mural calcification, which measured 5.1 cm on 01/09/2008 and is decreased in size, in keeping with a benign Bosniak category 2 left renal cyst. There is a simple 1.3 cm renal cyst in the posterior interpolar left kidney. No new contour deforming left renal lesions. Stomach/Bowel: Grossly normal stomach. Visualized small and large bowel is normal caliber, with no bowel wall thickening. Vascular/Lymphatic: Atherosclerotic nonaneurysmal abdominal aorta. No pathologically enlarged lymph nodes in the abdomen. Other: No pneumoperitoneum, ascites or focal fluid collection. Musculoskeletal: No aggressive appearing focal osseous lesions. Marked degenerative changes in the visualized thoracolumbar spine. IMPRESSION: 1. Benign Bosniak category 2 renal cysts in both kidneys, which are decreased in size since 2009. No suspicious renal lesions on this noncontrast study. 2. Mild fullness of the left renal collecting system without overt left hydronephrosis. No nephrolithiasis in the visualized portions of the urinary tracts. 3. Cholelithiasis, with no evidence of acute cholecystitis. No biliary ductal dilatation. Electronically Signed By: Ilona Sorrel M.D. On: 04/06/2015 14:55   Dg Chest 2 View  04/12/2015 CLINICAL DATA: Shortness of breath, weakness. EXAM: CHEST 2 VIEW COMPARISON: 06/29/2014 FINDINGS:  Prior valve replacement. Cardiomegaly. No confluent airspace opacities, effusions or edema. Degenerative changes in the thoracic spine. No acute bony abnormality. IMPRESSION: Cardiomegaly. No active disease. Electronically Signed By: Rolm Baptise M.D. On: 04/12/2015 16:42   Dg Chest Port 1 View  04/14/2015 CLINICAL DATA: Shortness of breath. EXAM: PORTABLE CHEST 1 VIEW COMPARISON: 04/12/2015. FINDINGS: Prior cardiac valve  replacement. Cardiomegaly. Mild pulmonary vascular prominence interstitial prominence consistent congestive heart failure. Small left pleural effusion. No pneumothorax . IMPRESSION: Prior cardiac valve replacement. Cardiomegaly with pulmonary vascular prominence and interstitial prominence and small left pleural effusion consistent with congestive heart failure. Pulmonary interstitial edema is new from 04/12/2015 . Electronically Signed By: Marcello Moores Register On: 04/14/2015 07:16      PERTINENT LAB RESULTS: CBC:  Recent Labs (last 2 labs)      Recent Labs  04/16/15 0114 04/17/15 0255  WBC 5.9 5.7  HGB 9.0* 8.9*  HCT 28.3* 28.7*  PLT 203 232     CMET CMP  Labs (Brief)       Component Value Date/Time   NA 137 04/17/2015 0255   NA 140 03/06/2015   NA 138 06/29/2014 0451   K 4.0 04/17/2015 0255   K 4.4 09/28/2014 1037   CL 101 04/17/2015 0255   CL 104 06/29/2014 0451   CO2 26 04/17/2015 0255   CO2 27 06/29/2014 0451   GLUCOSE 60* 04/17/2015 0255   GLUCOSE 151* 06/29/2014 0451   GLUCOSE 83 05/12/2013 1631   BUN 39* 04/17/2015 0255   BUN 41* 03/06/2015   BUN 37* 06/29/2014 0451   CREATININE 2.09* 04/17/2015 0255   CREATININE 1.9* 03/06/2015   CREATININE 1.60* 06/29/2014 0451   CALCIUM 9.0 04/17/2015 0255   CALCIUM 8.8 06/29/2014 0451   PROT 6.4* 04/12/2015 1645   PROT 7.3 08/09/2012 1257   PROT 6.6 12/18/2011 1433   ALBUMIN 3.5 04/12/2015 1645   ALBUMIN 3.6 08/09/2012 1257   ALBUMIN 4.2 12/18/2011 1433   AST 27 04/12/2015 1645   AST 24 08/09/2012 1257   ALT 16* 04/12/2015 1645   ALT 23 08/09/2012 1257   ALKPHOS 94 04/12/2015 1645   ALKPHOS 104 08/09/2012 1257   BILITOT 0.6 04/12/2015 1645   BILITOT 0.6 08/09/2012 1257   GFRNONAA 27* 04/17/2015 0255   GFRNONAA 44* 06/29/2014 0451   GFRNONAA 33*  11/08/2013 1411   GFRAA 32* 04/17/2015 0255   GFRAA 53* 06/29/2014 0451   GFRAA 38* 11/08/2013 1411      GFR Estimated Creatinine Clearance: 31.3 mL/min (by C-G formula based on Cr of 2.09).  Recent Labs (last 2 labs)     No results for input(s): LIPASE, AMYLASE in the last 72 hours.    Recent Labs (last 2 labs)     No results for input(s): CKTOTAL, CKMB, CKMBINDEX, TROPONINI in the last 72 hours.    Recent Labs (last 2 labs)     Invalid input(s): POCBNP    Recent Labs (last 2 labs)     No results for input(s): DDIMER in the last 72 hours.    Recent Labs (last 2 labs)     No results for input(s): HGBA1C in the last 72 hours.    Recent Labs (last 2 labs)     No results for input(s): CHOL, HDL, LDLCALC, TRIG, CHOLHDL, LDLDIRECT in the last 72 hours.    Recent Labs (last 2 labs)     No results for input(s): TSH, T4TOTAL, T3FREE, THYROIDAB in the last 72 hours.  Invalid input(s): FREET3  Recent Labs (last 2 labs)      Recent Labs  04/14/15 1319  FERRITIN 34  TIBC 339  IRON 12*     Coags:  Recent Labs (last 2 labs)     No results for input(s): INR in the last 72 hours.  Invalid input(s): PT   Microbiology: Recent Results (from the past 240 hour(s))  MRSA PCR Screening Status: None   Collection Time: 04/12/15 7:22 PM  Result Value Ref Range Status   MRSA by PCR NEGATIVE NEGATIVE Final    Comment:   The GeneXpert MRSA Assay (FDA approved for NASAL specimens only), is one component of a comprehensive MRSA colonization surveillance program. It is not intended to diagnose MRSA infection nor to guide or monitor treatment for MRSA infections.      BRIEF HOSPITAL COURSE:  Upper GI bleed: No further bleeding since admission. GI consulted-underwent endoscopy on 11/4-which showed angiodysplastic lesions that were ablated. Remains stable with stable hemoglobin. Continue PPI on discharge, follow with  gastroenterology if needed.  Acute blood loss anemia: Secondary to above, has anemia from chronic kidney disease at baseline. Transfused 1 unit of PRBC this admit. Hemoglobin remains stable at 8.9. Given IV Iron while inpatient, will resume oral Fe supplementation on d/c.  Acute on chronic diastolic heart failure: Suspect secondary to IV fluids, PRBC transfusion. Now compensated with numerous doses Lasix, current weight of 230 pounds is significantly below his dry weight of around 240 pounds. After discussing with patient and family-we will hold Lasix and allow his weight to rise to around 240 pounds before resuming. Please check weights closely on a daily basis at SNF, and resume Lasix accordingly. Once on Lasix, please check electrolytes closely.   Acute on Chronic kidney disease stage III: did develop mild worsening of renal function, likely secondary to Lasix. Lasix held, creatinine back down to usual baseline of around 2. Appears significantly below his dry weight-hence holding Lasix-see above.  Hypoglycemia: resolved.  Chronic atrial fibrillation:CHADSVasc score 7-rate currently controlled with Coreg-Eliquis was placed on hold due to GI bleeding.Spoke with Dr Hilarie Fredrickson on 11/4-recommendations were to hold Anticoagulation for atleast 3-5 days. Cards following-consideration for watchman device. After discussion with patient and family, recommendations are to resume anticoagulation on 04/20/15. Patient is aware of risks of recurrent GI bleed, if patient were to have another episode of GI bleeding-may need to permanently discontinue anticoagulation. Please ensure follow-up with cardiology.  Hypertension:Moderately controlled -continue Coreg 6.25 mg twice a day, increase amlodipine to 10 mg, and Imdur 30 mg on discharge. Will need further optimization of antihypertensive regimen on discharge.   History of CAD status post CABG: No chest pain. Continue Coreg  S/P AVR with bioprosthetic valve: Suspect  shortness of breath is likely secondary to anemia and acute diastolic heart failure. Monitor closely in the outpatient setting   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  Type 2 diabetes: Hypoglycemic on admission-but CBG's now stable-Continue Lantus to 50 units-follow closely and adjust in the outpatient setting   TODAY-DAY OF DISCHARGE:  Subjective:   Mertie Moores today has no headache,no chest abdominal pain,no new weakness tingling or numbness, feels much better wants to go home today.   Objective:   Blood pressure 165/67, pulse 76, temperature 98.4 F (36.9 C), temperature source Oral, resp. rate 18, height 5\' 10"  (1.778 m), weight 104.645 kg (230 lb 11.2 oz), SpO2 98 %.  Intake/Output Summary (Last 24 hours) at 04/17/15 0950 Last data filed at 04/17/15 1610  Gross  per 24 hour  Intake  1323 ml  Output  2375 ml  Net -1052 ml   Filed Weights   04/15/15 1214 04/16/15 0348 04/17/15 0426  Weight: 105.325 kg (232 lb 3.2 oz) 104.8 kg (231 lb 0.7 oz) 104.645 kg (230 lb 11.2 oz)    Exam Awake Alert, Oriented *3, No new F.N deficits, Normal affect [place].AT,PERRAL Supple Neck,No JVD, No cervical lymphadenopathy appriciated.  Symmetrical Chest wall movement, Good air movement bilaterally, CTAB RRR,No Gallops,Rubs or new Murmurs, No Parasternal Heave +ve B.Sounds, Abd Soft, Non tender, No organomegaly appriciated, No rebound -guarding or rigidity. No Cyanosis, Clubbing or edema, No new Rash or bruise  DISCHARGE CONDITION: Stable  DISPOSITION: SNF  DISCHARGE INSTRUCTIONS:  Activity:  As tolerated with Full fall precautions use walker/cane & assistance as needed  Get Medicines reviewed and adjusted: Please take all your medications with you for your next visit with your Primary MD  Please request your Primary MD to go over all hospital tests and procedure/radiological results at the follow up, please ask your Primary MD  to get all Hospital records sent to his/her office.  If you experience worsening of your admission symptoms, develop shortness of breath, life threatening emergency, suicidal or homicidal thoughts you must seek medical attention immediately by calling 911 or calling your MD immediately if symptoms less severe.  You must read complete instructions/literature along with all the possible adverse reactions/side effects for all the Medicines you take and that have been prescribed to you. Take any new Medicines after you have completely understood and accpet all the possible adverse reactions/side effects.   Do not drive when taking Pain medications.   Do not take more than prescribed Pain, Sleep and Anxiety Medications  Special Instructions: If you have smoked or chewed Tobacco in the last 2 yrs please stop smoking, stop any regular Alcohol and or any Recreational drug use.  Wear Seat belts while driving.  Please note  You were cared for by a hospitalist during your hospital stay. Once you are discharged, your primary care physician will handle any further medical issues. Please note that NO REFILLS for any discharge medications will be authorized once you are discharged, as it is imperative that you return to your primary care physician (or establish a relationship with a primary care physician if you do not have one) for your aftercare needs so that they can reassess your need for medications and monitor your lab values.   Diet recommendation: Diabetic Diet Heart Healthy diet Fluid restriction 1.5 lit/day  Discharge Instructions    (HEART FAILURE PATIENTS) Call MD: Anytime you have any of the following symptoms: 1) 3 pound weight gain in 24 hours or 5 pounds in 1 week 2) shortness of breath, with or without a dry hacking cough 3) swelling in the hands, feet or stomach 4) if you have to sleep on extra pillows at night in order to breathe.  Complete by: As directed      Call MD  for: difficulty breathing, headache or visual disturbances  Complete by: As directed      Call MD for:  Complete by: As directed   Hematochezia or melena     Diet - low sodium heart healthy  Complete by: As directed      Diet Carb Modified  Complete by: As directed      Increase activity slowly  Complete by: As directed            Follow-up Information  Follow up with [name], MD. Schedule an appointment as soon as possible for a visit in 2 weeks.   Specialty: Internal Medicine   Contact information:   [place] [address] [phone]       Follow up with Ida Rogue, MD. Schedule an appointment as soon as possible for a visit in 2 weeks.   Specialty: Cardiology   Contact information:   [place] [address] [phone]       Schedule an appointment as soon as possible for a visit with PYRTLE, Lajuan Lines, MD.   Specialty: Gastroenterology   Why: As needed-if recurrent GI bleed   Contact information:   [address] [place] [address] [phone]       Total Time spent on discharge equals 45 minutes.  SignedOren Binet 04/17/2015 9:50 AM

## 2015-04-20 DIAGNOSIS — I5032 Chronic diastolic (congestive) heart failure: Secondary | ICD-10-CM | POA: Insufficient documentation

## 2015-04-20 LAB — CBC AND DIFFERENTIAL: Hemoglobin: 8.6 g/dL — AB (ref 13.5–17.5)

## 2015-04-24 LAB — BASIC METABOLIC PANEL
BUN: 47 mg/dL — AB (ref 4–21)
Creatinine: 2 mg/dL — AB (ref <=1.3)
GLUCOSE: 44 mg/dL
Potassium: 4.2 mmol/L (ref 3.4–5.3)
SODIUM: 138 mmol/L (ref 137–147)

## 2015-04-25 ENCOUNTER — Encounter: Payer: Self-pay | Admitting: Physician Assistant

## 2015-04-25 ENCOUNTER — Ambulatory Visit (INDEPENDENT_AMBULATORY_CARE_PROVIDER_SITE_OTHER): Payer: Medicare Other | Admitting: Physician Assistant

## 2015-04-25 VITALS — BP 140/76 | HR 54 | Ht 70.0 in | Wt 239.0 lb

## 2015-04-25 DIAGNOSIS — D509 Iron deficiency anemia, unspecified: Secondary | ICD-10-CM

## 2015-04-25 DIAGNOSIS — K5900 Constipation, unspecified: Secondary | ICD-10-CM

## 2015-04-25 DIAGNOSIS — I63411 Cerebral infarction due to embolism of right middle cerebral artery: Secondary | ICD-10-CM

## 2015-04-25 MED ORDER — POLYETHYLENE GLYCOL 3350 17 GM/SCOOP PO POWD: 1.0000 | Freq: Every day | ORAL | Status: DC
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
## 2015-04-25 NOTE — Patient Instructions (Signed)
You have been given a lab order for a CBC to have drawn in 3 weeks.  Please start Miralax 17g once a day in juice or water.  We have sent the following medications to your pharmacy for you to pick up at your convenience: Miralax

## 2015-04-26 ENCOUNTER — Encounter: Payer: Self-pay | Admitting: Cardiovascular Disease

## 2015-04-26 ENCOUNTER — Non-Acute Institutional Stay (SKILLED_NURSING_FACILITY): Payer: Medicare Other | Admitting: Nurse Practitioner

## 2015-04-26 ENCOUNTER — Other Ambulatory Visit: Payer: Self-pay | Admitting: Internal Medicine

## 2015-04-26 ENCOUNTER — Ambulatory Visit (INDEPENDENT_AMBULATORY_CARE_PROVIDER_SITE_OTHER): Payer: Medicare Other | Admitting: Cardiovascular Disease

## 2015-04-26 VITALS — BP 142/67 | HR 56 | Ht 70.0 in | Wt 238.0 lb

## 2015-04-26 DIAGNOSIS — K922 Gastrointestinal hemorrhage, unspecified: Secondary | ICD-10-CM

## 2015-04-26 DIAGNOSIS — I5032 Chronic diastolic (congestive) heart failure: Secondary | ICD-10-CM | POA: Diagnosis not present

## 2015-04-26 DIAGNOSIS — I482 Chronic atrial fibrillation, unspecified: Secondary | ICD-10-CM

## 2015-04-26 DIAGNOSIS — E1121 Type 2 diabetes mellitus with diabetic nephropathy: Secondary | ICD-10-CM

## 2015-04-26 DIAGNOSIS — I63311 Cerebral infarction due to thrombosis of right middle cerebral artery: Secondary | ICD-10-CM

## 2015-04-26 DIAGNOSIS — N183 Chronic kidney disease, stage 3 unspecified: Secondary | ICD-10-CM

## 2015-04-26 DIAGNOSIS — I10 Essential (primary) hypertension: Secondary | ICD-10-CM

## 2015-04-26 DIAGNOSIS — I350 Nonrheumatic aortic (valve) stenosis: Secondary | ICD-10-CM | POA: Diagnosis not present

## 2015-04-26 DIAGNOSIS — D62 Acute posthemorrhagic anemia: Secondary | ICD-10-CM

## 2015-04-26 DIAGNOSIS — I63411 Cerebral infarction due to embolism of right middle cerebral artery: Secondary | ICD-10-CM

## 2015-04-26 DIAGNOSIS — D649 Anemia, unspecified: Secondary | ICD-10-CM

## 2015-04-26 MED ORDER — INSULIN GLARGINE 100 UNIT/ML ~~LOC~~ SOLN: 50.0000 [IU] | Freq: Every day | SUBCUTANEOUS | Status: DC

## 2015-04-26 NOTE — Assessment & Plan Note (Signed)
We'll repeat echocardiogram in follow-up visit

## 2015-04-26 NOTE — Assessment & Plan Note (Signed)
Creatinine of 2 He is requesting regular diet, not a cardiac diet. He would like more meat. Suggested he avoid breads, starches, high carbohydrate foods

## 2015-04-26 NOTE — Assessment & Plan Note (Signed)
Blood pressure is well controlled on today's visit. No changes made to the medications. 
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
## 2015-04-26 NOTE — Assessment & Plan Note (Signed)
Prior symptoms of shortness of breath, leg edema likely secondary to underlying anemia Symptoms stable at this time, trace edema should improve with improved blood count

## 2015-04-26 NOTE — Assessment & Plan Note (Signed)
Back on anticoagulation Lower dose given age 81 or older and renal dysfunction

## 2015-04-26 NOTE — Patient Instructions (Addendum)
You are doing well. No medication changes were made.  Leg elevation when sitting, (leg swelling is secondary to anemia and should improve as blood count improves) Normal cardiac function by previous echocardiogram  Please call us if you have new issues that need to be addressed before your next appt.  Your physician wants you to follow-up in: 3 months.  You will receive a reminder letter in the mail two months in advance. If you don't receive a letter, please call our office to schedule the follow-up appointment.

## 2015-04-26 NOTE — Assessment & Plan Note (Signed)
Placed back on anticoagulation, low-dose He denies any GI bleed

## 2015-04-26 NOTE — Assessment & Plan Note (Signed)
Heart rate well controlled. Tolerating anticoagulation,  low-dose given history of anemia, GI bleed. No changes to his medications
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
## 2015-05-06 DIAGNOSIS — D62 Acute posthemorrhagic anemia: Secondary | ICD-10-CM | POA: Diagnosis not present

## 2015-05-06 DIAGNOSIS — K922 Gastrointestinal hemorrhage, unspecified: Secondary | ICD-10-CM | POA: Diagnosis not present

## 2015-05-06 DIAGNOSIS — Z8551 Personal history of malignant neoplasm of bladder: Secondary | ICD-10-CM | POA: Diagnosis not present

## 2015-05-06 DIAGNOSIS — R269 Unspecified abnormalities of gait and mobility: Secondary | ICD-10-CM | POA: Diagnosis not present

## 2015-05-06 DIAGNOSIS — E119 Type 2 diabetes mellitus without complications: Secondary | ICD-10-CM | POA: Diagnosis not present

## 2015-05-06 DIAGNOSIS — I129 Hypertensive chronic kidney disease with stage 1 through stage 4 chronic kidney disease, or unspecified chronic kidney disease: Secondary | ICD-10-CM | POA: Diagnosis not present

## 2015-05-06 DIAGNOSIS — Z794 Long term (current) use of insulin: Secondary | ICD-10-CM | POA: Diagnosis not present

## 2015-05-06 DIAGNOSIS — N182 Chronic kidney disease, stage 2 (mild): Secondary | ICD-10-CM | POA: Diagnosis not present

## 2015-05-06 DIAGNOSIS — Z89421 Acquired absence of other right toe(s): Secondary | ICD-10-CM | POA: Diagnosis not present

## 2015-05-06 DIAGNOSIS — I2510 Atherosclerotic heart disease of native coronary artery without angina pectoris: Secondary | ICD-10-CM | POA: Diagnosis not present

## 2015-05-06 DIAGNOSIS — I509 Heart failure, unspecified: Secondary | ICD-10-CM | POA: Diagnosis not present

## 2015-05-06 DIAGNOSIS — Z89422 Acquired absence of other left toe(s): Secondary | ICD-10-CM | POA: Diagnosis not present

## 2015-05-09 ENCOUNTER — Inpatient Hospital Stay: Payer: Medicare Other

## 2015-05-09 DIAGNOSIS — D631 Anemia in chronic kidney disease: Secondary | ICD-10-CM

## 2015-05-09 DIAGNOSIS — N189 Chronic kidney disease, unspecified: Principal | ICD-10-CM

## 2015-05-09 DIAGNOSIS — D509 Iron deficiency anemia, unspecified: Secondary | ICD-10-CM | POA: Diagnosis not present

## 2015-05-09 LAB — SAMPLE TO BLOOD BANK

## 2015-05-09 LAB — HEMOGLOBIN: HEMOGLOBIN: 9.2 g/dL — AB (ref 13.0–18.0)

## 2015-05-12 DIAGNOSIS — D62 Acute posthemorrhagic anemia: Secondary | ICD-10-CM | POA: Diagnosis not present

## 2015-05-12 DIAGNOSIS — I2510 Atherosclerotic heart disease of native coronary artery without angina pectoris: Secondary | ICD-10-CM | POA: Diagnosis not present

## 2015-05-12 DIAGNOSIS — K922 Gastrointestinal hemorrhage, unspecified: Secondary | ICD-10-CM | POA: Diagnosis not present

## 2015-05-12 DIAGNOSIS — R269 Unspecified abnormalities of gait and mobility: Secondary | ICD-10-CM | POA: Diagnosis not present

## 2015-05-12 DIAGNOSIS — I509 Heart failure, unspecified: Secondary | ICD-10-CM | POA: Diagnosis not present

## 2015-05-12 DIAGNOSIS — E119 Type 2 diabetes mellitus without complications: Secondary | ICD-10-CM | POA: Diagnosis not present

## 2015-05-16 DIAGNOSIS — K922 Gastrointestinal hemorrhage, unspecified: Secondary | ICD-10-CM | POA: Diagnosis not present

## 2015-05-16 DIAGNOSIS — D62 Acute posthemorrhagic anemia: Secondary | ICD-10-CM | POA: Diagnosis not present

## 2015-05-16 DIAGNOSIS — R269 Unspecified abnormalities of gait and mobility: Secondary | ICD-10-CM | POA: Diagnosis not present

## 2015-05-16 DIAGNOSIS — I2510 Atherosclerotic heart disease of native coronary artery without angina pectoris: Secondary | ICD-10-CM | POA: Diagnosis not present

## 2015-05-16 DIAGNOSIS — I509 Heart failure, unspecified: Secondary | ICD-10-CM | POA: Diagnosis not present

## 2015-05-16 DIAGNOSIS — E119 Type 2 diabetes mellitus without complications: Secondary | ICD-10-CM | POA: Diagnosis not present

## 2015-05-17 ENCOUNTER — Encounter: Payer: Self-pay | Admitting: Internal Medicine

## 2015-05-17 ENCOUNTER — Ambulatory Visit (INDEPENDENT_AMBULATORY_CARE_PROVIDER_SITE_OTHER): Payer: Medicare Other | Admitting: Internal Medicine

## 2015-05-17 VITALS — BP 160/80 | HR 69 | Temp 97.9°F | Resp 12 | Ht 70.0 in | Wt 233.5 lb

## 2015-05-17 DIAGNOSIS — K31819 Angiodysplasia of stomach and duodenum without bleeding: Secondary | ICD-10-CM | POA: Diagnosis not present

## 2015-05-17 DIAGNOSIS — I509 Heart failure, unspecified: Secondary | ICD-10-CM | POA: Diagnosis not present

## 2015-05-17 DIAGNOSIS — I63411 Cerebral infarction due to embolism of right middle cerebral artery: Secondary | ICD-10-CM | POA: Diagnosis not present

## 2015-05-17 DIAGNOSIS — D62 Acute posthemorrhagic anemia: Secondary | ICD-10-CM | POA: Diagnosis not present

## 2015-05-17 DIAGNOSIS — K31811 Angiodysplasia of stomach and duodenum with bleeding: Secondary | ICD-10-CM | POA: Diagnosis not present

## 2015-05-17 DIAGNOSIS — K922 Gastrointestinal hemorrhage, unspecified: Secondary | ICD-10-CM | POA: Diagnosis not present

## 2015-05-17 DIAGNOSIS — N189 Chronic kidney disease, unspecified: Secondary | ICD-10-CM

## 2015-05-17 DIAGNOSIS — E1121 Type 2 diabetes mellitus with diabetic nephropathy: Secondary | ICD-10-CM

## 2015-05-17 DIAGNOSIS — N183 Chronic kidney disease, stage 3 unspecified: Secondary | ICD-10-CM

## 2015-05-17 DIAGNOSIS — E119 Type 2 diabetes mellitus without complications: Secondary | ICD-10-CM | POA: Diagnosis not present

## 2015-05-17 DIAGNOSIS — R269 Unspecified abnormalities of gait and mobility: Secondary | ICD-10-CM | POA: Diagnosis not present

## 2015-05-17 DIAGNOSIS — D631 Anemia in chronic kidney disease: Secondary | ICD-10-CM

## 2015-05-17 DIAGNOSIS — I10 Essential (primary) hypertension: Secondary | ICD-10-CM

## 2015-05-17 DIAGNOSIS — I2510 Atherosclerotic heart disease of native coronary artery without angina pectoris: Secondary | ICD-10-CM | POA: Diagnosis not present

## 2015-05-17 NOTE — Progress Notes (Signed)
Pre-visit discussion using our clinic review tool. No additional management support is needed unless otherwise documented below in the visit note.  

## 2015-05-17 NOTE — Patient Instructions (Addendum)
Reduce the Lantus to 45 units to avoid any more lows.  I already completed the paperwork for the Lantus subsidy from [employer]   Return in mid January for diabetes follow up

## 2015-05-17 NOTE — Progress Notes (Signed)
Subjective:  Patient ID: [name], male    DOB: [birth year]  Age: 81 y.o. MRN: [id number]  CC: The primary encounter diagnosis was CKD (chronic kidney disease) stage 3, GFR 30-59 ml/min. 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  HPI [name] presents for hospital follow  He was  admitted to [facility] with symptomatic anemia secondary to UGI bleed on Nov 2 and underwent transfusion of 1 unit PRBC and 2 iv iron transfusions.   Hgb was 8.9 post transfusion and stable at  discharge. An endoscopy was done, and ablation of angiodysplastic lesions was done.    PPI continued and GI follow up was not arranged.   He was discharged to skilled nursing .He was discharged to [facility] for almost 2 weeks,  Then he went to son [place],   Then home with a caregiver who  Is with him   About 2-4 hours per day .   Hgb was been stable and was 9.2 last week .He is getting it checked every 2 weeks at the cancer    Has not had any black tarry stools since discharge .  Taking protonix in the am before breakfast.   Sugars have been running "low. "  So he has suspended his short acting insulin (Novolog) for sugars < 200.  He had a low of 61 a few days ago, and called 911 due to blurred vision, but he refused transport to [facility]. He is checking his sugars 4 times daily,  He is taking Lantus 50 units daily.     HTN: amlodipine was increased to 10 mg .     Lasix was suspended until weight reaches 140 lbs .  He has been weighing himself daily and has not used any yet.     Outpatient Prescriptions Prior to Visit  Medication Sig Dispense Refill  . apixaban (ELIQUIS) 2.5 MG TABS tablet Take 1 tablet  (2.5 mg total) by mouth 2 (two) times daily. Start on 04/20/15    . Ascorbic Acid (VITAMIN C PO) Take 500 mg by mouth daily.     . carvedilol (COREG) 12.5 MG tablet Take 0.5 tablets (6.25 mg total) by mouth 2 (two) times daily with a meal.    . Cholecalciferol (VITAMIN D PO) Take 1 tablet by mouth 3 (three) times a week.    . Cholecalciferol (VITAMIN D3) 1000 UNITS CAPS Take 1 capsule by mouth daily.    . ferrous sulfate 325 (65 FE) MG tablet Take 325 mg by mouth daily with breakfast.    . insulin glargine (LANTUS) 100 UNIT/ML injection Inject 0.5 mLs (50 Units total) into the skin daily. 10 mL 11  . insulin lispro (HUMALOG) 100 UNIT/ML injection Inject into the skin 3 (three) times daily before meals. As directed by sliding scale    . Insulin Pen Needle 31G X 5 MM MISC Use as directed 100 each 5  . isosorbide mononitrate (IMDUR) 30 MG 24 hr tablet Take 1 tablet (30 mg total) by mouth daily. 30 tablet 0  . Multiple Vitamin (MULTI-VITAMIN PO) Take by mouth.      . Multiple Vitamins-Minerals (PRESERVISION AREDS PO) Take 1 capsule by mouth daily.    . pantoprazole (PROTONIX) 40 MG tablet TAKE 1 TABLET  EVERY DAY 30 tablet 11  . polyethylene glycol powder (GLYCOLAX/MIRALAX) powder Take 255 g by mouth daily. 255 g 3  . tamsulosin (FLOMAX) 0.4 MG CAPS capsule Take 1 capsule (0.4 mg total) by mouth daily after supper. 30 capsule 1  . cholecalciferol (VITAMIN D) 1000 UNITS tablet Take 1,000 Units by mouth daily.      . furosemide (LASIX) 40 MG tablet Take 0.5 tablets (20 mg total) by mouth daily. Start once weight is around 240 pounds (Patient not taking: Reported on 05/17/2015)    . amLODipine (NORVASC) 5 MG tablet Take 2 tablets (10 mg total) by mouth daily. (Patient not taking: Reported on 05/17/2015)     No facility-administered medications prior to visit.    Review of Systems;  Patient denies headache, fevers, malaise, unintentional weight loss, skin rash, eye pain, sinus congestion and sinus pain,  sore throat, dysphagia,  hemoptysis , cough, dyspnea, wheezing, chest pain, palpitations, orthopnea, edema, abdominal pain, nausea, melena, diarrhea, constipation, flank pain, dysuria, hematuria, urinary  Frequency, nocturia, numbness, tingling, seizures,  Focal weakness, Loss of consciousness,  Tremor, insomnia, depression, anxiety, and suicidal ideation.      Objective:  BP 160/80 mmHg  Pulse 69  Temp(Src) 97.9 F (36.6 C) (Oral)  Resp 12  Ht 5\' 10"  (1.778 m)  Wt 233 lb 8 oz (105.915 kg)  BMI 33.50 kg/m2  SpO2 98%  BP Readings from Last 3 Encounters:  05/17/15 160/80  04/26/15 132/72  04/26/15 142/67    Wt Readings from Last 3 Encounters:  05/17/15 233 lb 8 oz (105.915 kg)  04/26/15 238 lb (107.956 kg)  04/25/15 239 lb (108.41 kg)    General appearance: alert, cooperative and appears stated age Ears: normal TM's and external ear canals both ears Throat: lips, mucosa, and tongue normal; teeth and gums normal Neck: no adenopathy, no carotid bruit, supple, symmetrical, trachea midline and thyroid not enlarged, symmetric, no tenderness/mass/nodules Back: symmetric, no curvature. ROM normal. No CVA tenderness. Lungs: clear to auscultation bilaterally Heart: regular rate and rhythm, S1, S2 normal, no murmur, click, rub or gallop Abdomen: soft, non-tender; bowel sounds normal; no masses,  no organomegaly Pulses: 2+ and symmetric Skin: Skin color, texture, turgor normal. No rashes or lesions Lymph nodes: Cervical, supraclavicular, and axillary nodes normal.  Lab Results  Component Value Date   HGBA1C 7.1* 03/06/2015   HGBA1C 6.5 01/18/2015   HGBA1C 6.2 10/12/2014    Lab Results  Component Value Date   CREATININE 2.0* 04/24/2015   CREATININE 2.09* 04/17/2015   CREATININE 2.32* 04/16/2015    Lab Results  Component Value Date   WBC 5.7 04/17/2015   HGB 9.2* 05/09/2015   HCT 28.7* 04/17/2015   PLT 232 04/17/2015   GLUCOSE 60* 04/17/2015   CHOL 129 01/18/2015   TRIG  58.0 01/18/2015   HDL 45.00 01/18/2015   LDLDIRECT 71.0 01/18/2015   LDLCALC 72 01/18/2015   ALT 16* 04/12/2015   AST 27 04/12/2015   NA 138 04/24/2015   K 4.2 04/24/2015   CL 101 04/17/2015   CREATININE 2.0* 04/24/2015   BUN 47* 04/24/2015   CO2 26 04/17/2015   TSH 3.53 09/07/2014   PSA 0.48 09/07/2014   INR 1.20 04/12/2015   HGBA1C 7.1* 03/06/2015   MICROALBUR 33.5* 10/12/2014    Dg Chest 2 View  04/12/2015  CLINICAL DATA:  Shortness of breath, weakness. EXAM: CHEST  2 VIEW COMPARISON:  06/29/2014 FINDINGS: Prior valve replacement. Cardiomegaly. No confluent airspace opacities, effusions or edema. Degenerative  changes in the thoracic spine. No acute bony abnormality. IMPRESSION: Cardiomegaly.  No active disease. Electronically Signed   By: Rolm Baptise M.D.   On: 04/12/2015 16:42   A total of 40 minutes was spent with patient more than half of which was spent in counseling patient on the above mentioned issues , reviewing and explaining recent labs and imaging studies done, and coordination of care. Assessment & Plan:   Problem List Items Addressed This Visit    Angiodysplasia of stomach    Secondary to angiodysplasias, s/p ablation during EGD . He has has no repeat episodes       Anemia in chronic kidney disease    With recent GI bleed.  Post transfusion hgb has been stable  And is checked every 2 weeks by the Cancer center.       HTN (hypertension)    Uncontrolled secondary to patient not resuming his amlodipine. Advised to resume 5 mg daily .      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  Lab Results  Component Value Date   HGBA1C 7.1* 03/06/2015   Lab Results  Component Value Date   MICROALBUR 33.5* 10/12/2014         UGIB (upper gastrointestinal bleed)    Secondary to angiodysplasias      RESOLVED: Angiodysplasia of stomach and duodenum    S/p ablation during  admission endoscopy      CKD (chronic kidney disease) stage 3, GFR 30-59 ml/min - Primary      I have discontinued Mr. Carey's cholecalciferol and amLODipine. I am also having him maintain his Ascorbic Acid (VITAMIN C PO), Multiple Vitamin (MULTI-VITAMIN PO), tamsulosin, Insulin Pen Needle, Multiple Vitamins-Minerals (PRESERVISION AREDS PO), pantoprazole, Cholecalciferol (VITAMIN D PO), ferrous sulfate, carvedilol, apixaban, furosemide, isosorbide mononitrate, insulin lispro, polyethylene glycol powder, insulin glargine, and Vitamin D3.  No orders of the defined types were placed in this encounter.    Medications Discontinued During This Encounter  Medication Reason  . amLODipine (NORVASC) 5 MG tablet Patient Preference  . cholecalciferol (VITAMIN D) 1000 UNITS tablet Duplicate    Follow-up: Return in about 4 weeks (around 06/14/2015) for follow up diabetes.   [name], MD

## 2015-05-19 DIAGNOSIS — I2510 Atherosclerotic heart disease of native coronary artery without angina pectoris: Secondary | ICD-10-CM | POA: Diagnosis not present

## 2015-05-19 DIAGNOSIS — R269 Unspecified abnormalities of gait and mobility: Secondary | ICD-10-CM | POA: Diagnosis not present

## 2015-05-19 DIAGNOSIS — D62 Acute posthemorrhagic anemia: Secondary | ICD-10-CM | POA: Diagnosis not present

## 2015-05-19 DIAGNOSIS — K922 Gastrointestinal hemorrhage, unspecified: Secondary | ICD-10-CM | POA: Diagnosis not present

## 2015-05-19 DIAGNOSIS — I509 Heart failure, unspecified: Secondary | ICD-10-CM | POA: Diagnosis not present

## 2015-05-19 DIAGNOSIS — E119 Type 2 diabetes mellitus without complications: Secondary | ICD-10-CM | POA: Diagnosis not present

## 2015-05-20 ENCOUNTER — Telehealth: Payer: Self-pay | Admitting: Internal Medicine

## 2015-05-20 MED ORDER — AMLODIPINE BESYLATE 5 MG PO TABS: 5.0000 mg | ORAL_TABLET | Freq: Every day | ORAL | Status: DC

## 2015-05-20 NOTE — Assessment & Plan Note (Signed)
With recent GI bleed.  Post transfusion hgb has been stable  And is checked every 2 weeks by the Cancer center.

## 2015-05-20 NOTE — Assessment & Plan Note (Signed)
Well controlled on current regimen. Renal function stable, no changes today.  Lab Results  Component Value Date   CREATININE 2.0* 04/24/2015   Lab Results  Component Value Date   NA 138 04/24/2015   K 4.2 04/24/2015   CL 101 04/17/2015   CO2 26 04/17/2015

## 2015-05-20 NOTE — Assessment & Plan Note (Signed)
Agree with discontinuinig novolog due to recurrent hypoglycemia.  Lab Results  Component Value Date   HGBA1C 7.1* 03/06/2015   Lab Results  Component Value Date   MICROALBUR 33.5* 10/12/2014

## 2015-05-20 NOTE — Assessment & Plan Note (Addendum)
Secondary to angiodysplasias, s/p ablation during EGD . He has has no repeat episodes

## 2015-05-20 NOTE — Assessment & Plan Note (Signed)
Uncontrolled secondary to patient not resuming his amlodipine. Advised to resume 5 mg daily .

## 2015-05-20 NOTE — Assessment & Plan Note (Signed)
S/p ablation during admission endoscopy

## 2015-05-20 NOTE — Telephone Encounter (Signed)
I was reviewing his medications and would like him to resume 5 mg amlodipine daily for BP.  His bP was 160/80 at his recent  visit.

## 2015-05-20 NOTE — Assessment & Plan Note (Signed)
Secondary to angiodysplasias

## 2015-05-22 DIAGNOSIS — K922 Gastrointestinal hemorrhage, unspecified: Secondary | ICD-10-CM | POA: Diagnosis not present

## 2015-05-22 DIAGNOSIS — I509 Heart failure, unspecified: Secondary | ICD-10-CM | POA: Diagnosis not present

## 2015-05-22 DIAGNOSIS — I2510 Atherosclerotic heart disease of native coronary artery without angina pectoris: Secondary | ICD-10-CM | POA: Diagnosis not present

## 2015-05-22 DIAGNOSIS — E119 Type 2 diabetes mellitus without complications: Secondary | ICD-10-CM | POA: Diagnosis not present

## 2015-05-22 DIAGNOSIS — D62 Acute posthemorrhagic anemia: Secondary | ICD-10-CM | POA: Diagnosis not present

## 2015-05-22 DIAGNOSIS — R269 Unspecified abnormalities of gait and mobility: Secondary | ICD-10-CM | POA: Diagnosis not present

## 2015-05-22 NOTE — Telephone Encounter (Signed)
Patient notified and voiced understanding.

## 2015-05-23 ENCOUNTER — Other Ambulatory Visit: Payer: Medicare Other

## 2015-05-23 DIAGNOSIS — I509 Heart failure, unspecified: Secondary | ICD-10-CM | POA: Diagnosis not present

## 2015-05-23 DIAGNOSIS — K922 Gastrointestinal hemorrhage, unspecified: Secondary | ICD-10-CM | POA: Diagnosis not present

## 2015-05-23 DIAGNOSIS — D62 Acute posthemorrhagic anemia: Secondary | ICD-10-CM | POA: Diagnosis not present

## 2015-05-23 DIAGNOSIS — E119 Type 2 diabetes mellitus without complications: Secondary | ICD-10-CM | POA: Diagnosis not present

## 2015-05-23 DIAGNOSIS — I2510 Atherosclerotic heart disease of native coronary artery without angina pectoris: Secondary | ICD-10-CM | POA: Diagnosis not present

## 2015-05-23 DIAGNOSIS — R269 Unspecified abnormalities of gait and mobility: Secondary | ICD-10-CM | POA: Diagnosis not present

## 2015-05-24 ENCOUNTER — Ambulatory Visit: Payer: Medicare Other | Admitting: Internal Medicine

## 2015-05-25 DIAGNOSIS — B351 Tinea unguium: Secondary | ICD-10-CM | POA: Diagnosis not present

## 2015-05-25 DIAGNOSIS — E1140 Type 2 diabetes mellitus with diabetic neuropathy, unspecified: Secondary | ICD-10-CM | POA: Diagnosis not present

## 2015-05-25 DIAGNOSIS — Z794 Long term (current) use of insulin: Secondary | ICD-10-CM | POA: Diagnosis not present

## 2015-05-26 DIAGNOSIS — R269 Unspecified abnormalities of gait and mobility: Secondary | ICD-10-CM | POA: Diagnosis not present

## 2015-05-26 DIAGNOSIS — D62 Acute posthemorrhagic anemia: Secondary | ICD-10-CM | POA: Diagnosis not present

## 2015-05-26 DIAGNOSIS — I2510 Atherosclerotic heart disease of native coronary artery without angina pectoris: Secondary | ICD-10-CM | POA: Diagnosis not present

## 2015-05-26 DIAGNOSIS — I509 Heart failure, unspecified: Secondary | ICD-10-CM | POA: Diagnosis not present

## 2015-05-26 DIAGNOSIS — K922 Gastrointestinal hemorrhage, unspecified: Secondary | ICD-10-CM | POA: Diagnosis not present

## 2015-05-26 DIAGNOSIS — E119 Type 2 diabetes mellitus without complications: Secondary | ICD-10-CM | POA: Diagnosis not present

## 2015-05-29 DIAGNOSIS — I509 Heart failure, unspecified: Secondary | ICD-10-CM | POA: Diagnosis not present

## 2015-05-29 DIAGNOSIS — D62 Acute posthemorrhagic anemia: Secondary | ICD-10-CM | POA: Diagnosis not present

## 2015-05-29 DIAGNOSIS — R269 Unspecified abnormalities of gait and mobility: Secondary | ICD-10-CM | POA: Diagnosis not present

## 2015-05-29 DIAGNOSIS — E119 Type 2 diabetes mellitus without complications: Secondary | ICD-10-CM | POA: Diagnosis not present

## 2015-05-29 DIAGNOSIS — I2510 Atherosclerotic heart disease of native coronary artery without angina pectoris: Secondary | ICD-10-CM | POA: Diagnosis not present

## 2015-05-29 DIAGNOSIS — K922 Gastrointestinal hemorrhage, unspecified: Secondary | ICD-10-CM | POA: Diagnosis not present

## 2015-05-30 DIAGNOSIS — E119 Type 2 diabetes mellitus without complications: Secondary | ICD-10-CM | POA: Diagnosis not present

## 2015-05-30 DIAGNOSIS — R269 Unspecified abnormalities of gait and mobility: Secondary | ICD-10-CM | POA: Diagnosis not present

## 2015-05-30 DIAGNOSIS — D62 Acute posthemorrhagic anemia: Secondary | ICD-10-CM | POA: Diagnosis not present

## 2015-05-30 DIAGNOSIS — I2510 Atherosclerotic heart disease of native coronary artery without angina pectoris: Secondary | ICD-10-CM | POA: Diagnosis not present

## 2015-05-30 DIAGNOSIS — I509 Heart failure, unspecified: Secondary | ICD-10-CM | POA: Diagnosis not present

## 2015-05-30 DIAGNOSIS — K922 Gastrointestinal hemorrhage, unspecified: Secondary | ICD-10-CM | POA: Diagnosis not present

## 2015-05-31 ENCOUNTER — Other Ambulatory Visit: Payer: Self-pay | Admitting: *Deleted

## 2015-05-31 ENCOUNTER — Encounter: Payer: Self-pay | Admitting: Internal Medicine

## 2015-05-31 ENCOUNTER — Inpatient Hospital Stay (HOSPITAL_BASED_OUTPATIENT_CLINIC_OR_DEPARTMENT_OTHER): Payer: Medicare Other | Admitting: Internal Medicine

## 2015-05-31 ENCOUNTER — Inpatient Hospital Stay: Payer: Medicare Other | Attending: Internal Medicine

## 2015-05-31 VITALS — BP 162/83 | HR 74 | Temp 97.9°F | Resp 20 | Ht 70.0 in | Wt 237.0 lb

## 2015-05-31 DIAGNOSIS — D631 Anemia in chronic kidney disease: Secondary | ICD-10-CM | POA: Insufficient documentation

## 2015-05-31 DIAGNOSIS — I10 Essential (primary) hypertension: Secondary | ICD-10-CM | POA: Diagnosis not present

## 2015-05-31 DIAGNOSIS — Z808 Family history of malignant neoplasm of other organs or systems: Secondary | ICD-10-CM

## 2015-05-31 DIAGNOSIS — D509 Iron deficiency anemia, unspecified: Secondary | ICD-10-CM

## 2015-05-31 DIAGNOSIS — H3530 Unspecified macular degeneration: Secondary | ICD-10-CM | POA: Diagnosis not present

## 2015-05-31 DIAGNOSIS — R269 Unspecified abnormalities of gait and mobility: Secondary | ICD-10-CM | POA: Diagnosis not present

## 2015-05-31 DIAGNOSIS — Z79899 Other long term (current) drug therapy: Secondary | ICD-10-CM | POA: Diagnosis not present

## 2015-05-31 DIAGNOSIS — I350 Nonrheumatic aortic (valve) stenosis: Secondary | ICD-10-CM

## 2015-05-31 DIAGNOSIS — K922 Gastrointestinal hemorrhage, unspecified: Secondary | ICD-10-CM | POA: Diagnosis not present

## 2015-05-31 DIAGNOSIS — Z87891 Personal history of nicotine dependence: Secondary | ICD-10-CM | POA: Diagnosis not present

## 2015-05-31 DIAGNOSIS — Z794 Long term (current) use of insulin: Secondary | ICD-10-CM | POA: Diagnosis not present

## 2015-05-31 DIAGNOSIS — E119 Type 2 diabetes mellitus without complications: Secondary | ICD-10-CM | POA: Diagnosis not present

## 2015-05-31 DIAGNOSIS — Z8551 Personal history of malignant neoplasm of bladder: Secondary | ICD-10-CM

## 2015-05-31 DIAGNOSIS — N400 Enlarged prostate without lower urinary tract symptoms: Secondary | ICD-10-CM | POA: Insufficient documentation

## 2015-05-31 DIAGNOSIS — M1990 Unspecified osteoarthritis, unspecified site: Secondary | ICD-10-CM | POA: Diagnosis not present

## 2015-05-31 DIAGNOSIS — N289 Disorder of kidney and ureter, unspecified: Secondary | ICD-10-CM

## 2015-05-31 DIAGNOSIS — I509 Heart failure, unspecified: Secondary | ICD-10-CM | POA: Diagnosis not present

## 2015-05-31 DIAGNOSIS — D62 Acute posthemorrhagic anemia: Secondary | ICD-10-CM | POA: Diagnosis not present

## 2015-05-31 DIAGNOSIS — I441 Atrioventricular block, second degree: Secondary | ICD-10-CM | POA: Insufficient documentation

## 2015-05-31 DIAGNOSIS — Z8719 Personal history of other diseases of the digestive system: Secondary | ICD-10-CM | POA: Insufficient documentation

## 2015-05-31 DIAGNOSIS — D649 Anemia, unspecified: Secondary | ICD-10-CM

## 2015-05-31 DIAGNOSIS — R001 Bradycardia, unspecified: Secondary | ICD-10-CM | POA: Diagnosis not present

## 2015-05-31 DIAGNOSIS — I2510 Atherosclerotic heart disease of native coronary artery without angina pectoris: Secondary | ICD-10-CM | POA: Insufficient documentation

## 2015-05-31 LAB — CBC WITH DIFFERENTIAL/PLATELET
BASOS ABS: 0 10*3/uL (ref 0–0.1)
BASOS PCT: 1 %
EOS PCT: 4 %
Eosinophils Absolute: 0.2 10*3/uL (ref 0–0.7)
HEMATOCRIT: 29.2 % — AB (ref 40.0–52.0)
Hemoglobin: 9.5 g/dL — ABNORMAL LOW (ref 13.0–18.0)
Lymphocytes Relative: 11 %
Lymphs Abs: 0.6 10*3/uL — ABNORMAL LOW (ref 1.0–3.6)
MCH: 27.4 pg (ref 26.0–34.0)
MCHC: 32.3 g/dL (ref 32.0–36.0)
MCV: 84.6 fL (ref 80.0–100.0)
MONO ABS: 0.6 10*3/uL (ref 0.2–1.0)
Monocytes Relative: 10 %
NEUTROS ABS: 4 10*3/uL (ref 1.4–6.5)
Neutrophils Relative %: 74 %
PLATELETS: 218 10*3/uL (ref 150–440)
RBC: 3.45 MIL/uL — ABNORMAL LOW (ref 4.40–5.90)
RDW: 17.9 % — AB (ref 11.5–14.5)
WBC: 5.5 10*3/uL (ref 3.8–10.6)

## 2015-05-31 LAB — IRON AND TIBC
IRON: 45 ug/dL (ref 45–182)
Saturation Ratios: 17 % — ABNORMAL LOW (ref 17.9–39.5)
TIBC: 262 ug/dL (ref 250–450)
UIBC: 217 ug/dL

## 2015-05-31 LAB — SAMPLE TO BLOOD BANK

## 2015-05-31 LAB — FERRITIN: Ferritin: 125 ng/mL (ref 24–336)

## 2015-05-31 NOTE — Progress Notes (Signed)
Pt states he is feeling good now. He was in hosp. 11/4 and had  AVM's and they were cauterized.  Got blood while in hosp.  He is not taking PT and he walked 550 ft today with cane. He does not see any blood in urine or stool

## 2015-05-31 NOTE — Progress Notes (Signed)
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 stroke. We will continue to monitor him q3 weeks and consider PRBC tx if Hb drops <8. She will continue with oral iron supplementation. He'll return to our clinic in 3 months  In between visits, patient advised to call or come  to ER in case of any worsening anemia symptoms, bleeding issues, or acute sickness. He is agreeable to this plan.     Roxana Hires, MD   05/31/2015 2:53 PM

## 2015-06-02 DIAGNOSIS — D62 Acute posthemorrhagic anemia: Secondary | ICD-10-CM | POA: Diagnosis not present

## 2015-06-02 DIAGNOSIS — E119 Type 2 diabetes mellitus without complications: Secondary | ICD-10-CM | POA: Diagnosis not present

## 2015-06-02 DIAGNOSIS — I2510 Atherosclerotic heart disease of native coronary artery without angina pectoris: Secondary | ICD-10-CM | POA: Diagnosis not present

## 2015-06-02 DIAGNOSIS — I509 Heart failure, unspecified: Secondary | ICD-10-CM | POA: Diagnosis not present

## 2015-06-02 DIAGNOSIS — K922 Gastrointestinal hemorrhage, unspecified: Secondary | ICD-10-CM | POA: Diagnosis not present

## 2015-06-02 DIAGNOSIS — R269 Unspecified abnormalities of gait and mobility: Secondary | ICD-10-CM | POA: Diagnosis not present

## 2015-06-21 ENCOUNTER — Ambulatory Visit (INDEPENDENT_AMBULATORY_CARE_PROVIDER_SITE_OTHER): Payer: Medicare Other | Admitting: Internal Medicine

## 2015-06-21 ENCOUNTER — Encounter: Payer: Self-pay | Admitting: Internal Medicine

## 2015-06-21 ENCOUNTER — Inpatient Hospital Stay: Payer: Medicare Other | Attending: Internal Medicine

## 2015-06-21 VITALS — BP 128/62 | HR 72 | Temp 98.0°F | Resp 12 | Ht 70.0 in | Wt 238.1 lb

## 2015-06-21 DIAGNOSIS — I5032 Chronic diastolic (congestive) heart failure: Secondary | ICD-10-CM | POA: Diagnosis not present

## 2015-06-21 DIAGNOSIS — D649 Anemia, unspecified: Secondary | ICD-10-CM

## 2015-06-21 DIAGNOSIS — D509 Iron deficiency anemia, unspecified: Secondary | ICD-10-CM | POA: Diagnosis not present

## 2015-06-21 DIAGNOSIS — I10 Essential (primary) hypertension: Secondary | ICD-10-CM | POA: Diagnosis not present

## 2015-06-21 DIAGNOSIS — E1121 Type 2 diabetes mellitus with diabetic nephropathy: Secondary | ICD-10-CM | POA: Diagnosis not present

## 2015-06-21 LAB — CBC WITH DIFFERENTIAL/PLATELET
BASOS ABS: 0.1 10*3/uL (ref 0–0.1)
Basophils Relative: 1 %
EOS ABS: 0.3 10*3/uL (ref 0–0.7)
EOS PCT: 5 %
HCT: 28.1 % — ABNORMAL LOW (ref 40.0–52.0)
Hemoglobin: 9.1 g/dL — ABNORMAL LOW (ref 13.0–18.0)
LYMPHS ABS: 0.7 10*3/uL — AB (ref 1.0–3.6)
LYMPHS PCT: 11 %
MCH: 27.7 pg (ref 26.0–34.0)
MCHC: 32.2 g/dL (ref 32.0–36.0)
MCV: 86.1 fL (ref 80.0–100.0)
MONO ABS: 0.6 10*3/uL (ref 0.2–1.0)
Monocytes Relative: 10 %
Neutro Abs: 4.6 10*3/uL (ref 1.4–6.5)
Neutrophils Relative %: 73 %
PLATELETS: 223 10*3/uL (ref 150–440)
RBC: 3.27 MIL/uL — ABNORMAL LOW (ref 4.40–5.90)
RDW: 17.8 % — AB (ref 11.5–14.5)
WBC: 6.2 10*3/uL (ref 3.8–10.6)

## 2015-06-21 LAB — MICROALBUMIN / CREATININE URINE RATIO
Creatinine,U: 43.9 mg/dL
MICROALB UR: 70.5 mg/dL — AB (ref 0.0–1.9)
MICROALB/CREAT RATIO: 160.5 mg/g — AB (ref 0.0–30.0)

## 2015-06-21 LAB — SAMPLE TO BLOOD BANK

## 2015-06-21 NOTE — Patient Instructions (Addendum)
Please check your weight  every morning after you empty your bladder and keep a chart.  If you gain 2 lbs overnight,  You are retaining water,  And need to let me or Dr Rockey Situ know.   Please obtain another pulse oximeter and check your pulse and oxygen level when you are short of breat

## 2015-06-21 NOTE — Progress Notes (Signed)
Subjective:  Patient ID: [name], male    DOB: [birth year]  Age: 82 y.o. MRN: [id number]  CC: The primary encounter diagnosis was Diabetic glomerulopathy ([place]). 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  HPI [name] presents for follow up on uncontrolled hypertension,  CKD with symptomatic anemia requiring recent transfusion,   DM type 2,  Insulin requiring with CKD and neuropathy. .    He has reduced his daily insulin dose for recurrent hypoglycemia as directed and reports no recurrence of lows.   Fasting 93,  200 at lunch gave himself 4 units  At lunch . No recent lows  He has been Checking BP at home  130-140/70.    Has resumed [employer] exercise class twice weekly. Rides the NuStep x 15 minutes ,  Walks the treadmill,  And uses low weights.  Not short of breath during the class.  Notices dyspnea after he finishes his home routine.    Foot exam done otdya noting chronic  Neuropathy with no current ulcerations.  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 Sees  Dr Cleda Mccreedy podiatry q 3 months  Outpatient Prescriptions Prior to Visit  Medication Sig Dispense Refill  . amLODipine (NORVASC) 5 MG tablet Take 1 tablet (5 mg total) by mouth daily. 30 tablet 2  . apixaban (ELIQUIS) 2.5 MG TABS tablet Take 1 tablet (2.5 mg total) by mouth 2 (two) times daily. Start on 04/20/15    . Ascorbic Acid (VITAMIN C PO) Take 500 mg by mouth daily.     . carvedilol (COREG) 12.5 MG tablet Take 0.5 tablets (6.25 mg total) by mouth 2 (two) times daily with a meal.    . Cholecalciferol (VITAMIN D PO) Take 1 tablet by mouth 3 (three) times a week.    . Cholecalciferol (VITAMIN D3) 1000 UNITS CAPS Take 1 capsule by mouth daily.    . ferrous sulfate 325 (65 FE) MG tablet Take 325 mg by mouth daily with breakfast.    . insulin glargine (LANTUS) 100  UNIT/ML injection Inject 0.5 mLs (50 Units total) into the skin daily. (Patient taking differently: Inject 50 Units into the skin daily. .45 dose each day) 10 mL 11  . insulin lispro (HUMALOG) 100 UNIT/ML injection Inject into the skin 4 (four) times daily - after meals and at bedtime. As directed by sliding scale    . Insulin Pen Needle 31G X 5 MM MISC Use as directed 100 each 5  . isosorbide mononitrate (IMDUR) 30 MG 24 hr tablet Take 1 tablet (30 mg total) by mouth daily. 30 tablet 0  . Multiple Vitamin (MULTI-VITAMIN PO) Take by mouth.      . Multiple Vitamins-Minerals (PRESERVISION AREDS PO) Take 1 capsule by mouth daily.    . pantoprazole (PROTONIX) 40 MG tablet TAKE 1 TABLET EVERY DAY 30 tablet 11  . polyethylene glycol powder (GLYCOLAX/MIRALAX) powder Take 255 g by mouth daily. (Patient taking differently: Take 1 Container by mouth daily as needed. ) 255 g 3  . tamsulosin (FLOMAX) 0.4 MG CAPS capsule Take 1 capsule (0.4 mg total) by mouth daily after supper. 30 capsule 1   No facility-administered medications prior to visit.    Review of Systems;  Patient denies headache, fevers, malaise, unintentional weight loss, skin rash, eye pain, sinus congestion and sinus pain, sore throat, dysphagia,  hemoptysis , cough, ,  wheezing, chest pain, palpitations, orthopnea, edema, abdominal pain, nausea, melena, diarrhea, constipation, flank pain, dysuria, hematuria, urinary  Frequency, nocturia, numbness, tingling, seizures,  Focal weakness, Loss of consciousness,  Tremor, insomnia, depression, anxiety, and suicidal ideation.      Objective:  BP 128/62 mmHg  Pulse 72  Temp(Src) 98 F (36.7 C) (Oral)  Resp 12  Ht 5\' 10"  (1.778 m)  Wt 238 lb 2 oz (108.013 kg)  BMI 34.17 kg/m2  SpO2 96%  BP Readings from Last 3 Encounters:  06/21/15 128/62  05/31/15 162/83  05/17/15 160/80    Wt Readings from Last 3 Encounters:  06/21/15 238 lb 2 oz (108.013 kg)  05/31/15 236 lb 15.9 oz (107.5 kg)    05/17/15 233 lb 8 oz (105.915 kg)    General appearance: alert, cooperative and appears stated age Ears: normal TM's and external ear canals both ears Throat: lips, mucosa, and tongue normal; teeth and gums normal Neck: no adenopathy, no carotid bruit, supple, symmetrical, trachea midline and thyroid not enlarged, symmetric, no tenderness/mass/nodules Back: symmetric, no curvature. ROM normal. No CVA tenderness. Lungs: clear to auscultation bilaterally Heart: regular rate and rhythm, S1, S2 normal, no murmur, click, rub or gallop Abdomen: soft, non-tender; bowel sounds normal; no masses,  no organomegaly Pulses: 2+ and symmetric Skin: Skin color, texture, turgor normal. No rashes or lesions Lymph nodes: Cervical, supraclavicular, and axillary nodes normal.  Lab Results  Component Value Date   HGBA1C 6.9* 06/22/2015   HGBA1C 7.1* 03/06/2015   HGBA1C 6.5 01/18/2015    Lab Results  Component Value Date   CREATININE 1.94* 06/22/2015   CREATININE 2.0* 04/24/2015   CREATININE 2.09* 04/17/2015    Lab Results  Component Value Date   WBC 6.2 06/21/2015   HGB 9.1* 06/21/2015   HCT 28.1* 06/21/2015   PLT 223 06/21/2015   GLUCOSE 191* 06/22/2015   CHOL 129 01/18/2015   TRIG 58.0 01/18/2015   HDL 45.00 01/18/2015   LDLDIRECT 56.0 06/22/2015   LDLCALC 72 01/18/2015   ALT 12 06/22/2015   AST 20 06/22/2015   NA 135 06/22/2015   K 5.1 06/22/2015   CL 102 06/22/2015   CREATININE 1.94* 06/22/2015   BUN 39* 06/22/2015   CO2 27 06/22/2015   TSH 3.53 09/07/2014   PSA 0.48 09/07/2014   INR 1.20 04/12/2015   HGBA1C 6.9* 06/22/2015   MICROALBUR 70.5* 06/21/2015    Dg Chest 2 View  04/12/2015  CLINICAL DATA:  Shortness of breath, weakness. EXAM: CHEST  2 VIEW COMPARISON:  06/29/2014 FINDINGS: Prior valve replacement. Cardiomegaly. No confluent airspace opacities, effusions or edema. Degenerative changes in the thoracic spine. No acute bony abnormality. IMPRESSION: Cardiomegaly.  No  active disease. Electronically Signed   By: Rolm Baptise M.D.   On: 04/12/2015 16:42    Assessment & Plan:   Problem List Items Addressed This Visit    HTN (hypertension)    Well controlled on current regimen. Renal function stable, no changes today.  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  No changes today,  Foot exam done,  No ulcerations,   ,  Prior 5th right toe amputation and chronic neuropathy noted .  Lab Results  Component Value Date   HGBA1C 6.9* 06/22/2015   Lab Results  Component Value Date   MICROALBUR 70.5* 06/21/2015   Lab Results  Component Value Date   CREATININE 1.94* 06/22/2015         Symptomatic anemia    Secondary to recent GI bleed.  Post transfusion hgb was 86 and rose to 9.5 3 weeks ago but has fallen slightly by today's check, he is dyspneic after workouts but not with ambulation   [facility] .  Lab Results  Component Value Date   WBC 6.2 06/21/2015   HGB 9.1* 06/21/2015   HCT 28.1* 06/21/2015   MCV 86.1 06/21/2015   PLT 223 06/21/2015        Chronic diastolic heart failure ([place])    He was advised to weigh himself daily and report any gains of f2 lbs overnight to ne or Dr. Rockey Situ       Other Visit Diagnoses    Diabetic glomerulopathy [facility])    -  Primary    Relevant Orders    Comprehensive metabolic panel (Completed)    Hemoglobin A1c (Completed)    LDL cholesterol, direct (Completed)    Microalbumin / creatinine urine ratio (Completed)     A total of 25 minutes of face to face time was spent with patient more than half of which was spent in counselling about the above  mentioned conditions  and coordination of care   I am having Mr. Counterman maintain his Ascorbic Acid (VITAMIN C PO), Multiple Vitamin (MULTI-VITAMIN PO), Insulin Pen Needle, Multiple Vitamins-Minerals (PRESERVISION AREDS PO), pantoprazole, Cholecalciferol (VITAMIN D PO), ferrous sulfate, carvedilol, apixaban, isosorbide mononitrate, insulin lispro, polyethylene glycol powder, insulin glargine, Vitamin D3, and amLODipine.  No orders of the defined types were placed in this encounter.    There are no discontinued medications.  Follow-up: Return in about 3 months (around 09/19/2015) for follow up diabetes.   [name], MD

## 2015-06-21 NOTE — Progress Notes (Signed)
Pre-visit discussion using our clinic review tool. No additional management support is needed unless otherwise documented below in the visit note.  

## 2015-06-22 ENCOUNTER — Other Ambulatory Visit: Payer: Self-pay | Admitting: Physical Medicine and Rehabilitation

## 2015-06-22 ENCOUNTER — Other Ambulatory Visit: Payer: Self-pay | Admitting: Internal Medicine

## 2015-06-22 ENCOUNTER — Other Ambulatory Visit (INDEPENDENT_AMBULATORY_CARE_PROVIDER_SITE_OTHER): Payer: Medicare Other

## 2015-06-22 DIAGNOSIS — E785 Hyperlipidemia, unspecified: Secondary | ICD-10-CM

## 2015-06-22 DIAGNOSIS — E1121 Type 2 diabetes mellitus with diabetic nephropathy: Secondary | ICD-10-CM | POA: Diagnosis not present

## 2015-06-22 LAB — COMPREHENSIVE METABOLIC PANEL
ALBUMIN: 3.9 g/dL (ref 3.5–5.2)
ALT: 12 U/L (ref 0–53)
AST: 20 U/L (ref 0–37)
Alkaline Phosphatase: 111 U/L (ref 39–117)
BILIRUBIN TOTAL: 0.6 mg/dL (ref 0.2–1.2)
BUN: 39 mg/dL — ABNORMAL HIGH (ref 6–23)
CALCIUM: 9.1 mg/dL (ref 8.4–10.5)
CHLORIDE: 102 meq/L (ref 96–112)
CO2: 27 meq/L (ref 19–32)
Creatinine, Ser: 1.94 mg/dL — ABNORMAL HIGH (ref 0.40–1.50)
GFR: 35.05 mL/min — AB (ref >60.00)
Glucose, Bld: 191 mg/dL — ABNORMAL HIGH (ref 70–99)
Potassium: 5.1 mEq/L (ref 3.5–5.1)
Sodium: 135 mEq/L (ref 135–145)
Total Protein: 6.4 g/dL (ref 6.0–8.3)

## 2015-06-22 LAB — LDL CHOLESTEROL, DIRECT: Direct LDL: 56 mg/dL

## 2015-06-22 LAB — HEMOGLOBIN A1C: HEMOGLOBIN A1C: 6.9 % — AB (ref 4.6–6.5)

## 2015-06-24 NOTE — Assessment & Plan Note (Signed)
Glycemic control has intentionally loosened given his age and recurrent hypoglycemia with tighter control.  No changes today,  Foot exam done,  No ulcerations,   ,  Prior 5th right toe amputation and chronic neuropathy noted .  Lab Results  Component Value Date   HGBA1C 6.9* 06/22/2015   Lab Results  Component Value Date   MICROALBUR 70.5* 06/21/2015   Lab Results  Component Value Date   CREATININE 1.94* 06/22/2015

## 2015-06-24 NOTE — Assessment & Plan Note (Signed)
Well controlled on current regimen. Renal function stable, no changes today.  Lab Results  Component Value Date   CREATININE 1.94* 06/22/2015   Lab Results  Component Value Date   NA 135 06/22/2015   K 5.1 06/22/2015   CL 102 06/22/2015   CO2 27 06/22/2015

## 2015-06-24 NOTE — Assessment & Plan Note (Addendum)
Secondary to recent GI bleed.  Post transfusion hgb was 86 and rose to 9.5 3 weeks ago but has fallen slightly by today's check, he is dyspneic after workouts but not with ambulation   [facility] .  Lab Results  Component Value Date   WBC 6.2 06/21/2015   HGB 9.1* 06/21/2015   HCT 28.1* 06/21/2015   MCV 86.1 06/21/2015   PLT 223 06/21/2015

## 2015-06-24 NOTE — Assessment & Plan Note (Signed)
He was advised to weigh himself daily and report any gains of f2 lbs overnight to ne or Dr. Rockey Situ

## 2015-06-26 ENCOUNTER — Telehealth: Payer: Self-pay | Admitting: *Deleted

## 2015-06-26 ENCOUNTER — Encounter: Payer: Self-pay | Admitting: *Deleted

## 2015-06-26 ENCOUNTER — Other Ambulatory Visit: Payer: Self-pay | Admitting: Physical Medicine and Rehabilitation

## 2015-06-26 NOTE — Telephone Encounter (Signed)
Pt has question about possible echo per [place]. Please advise

## 2015-06-26 NOTE — Telephone Encounter (Signed)
Eliquis samples 2.5 mg placed at front desk for pick up.

## 2015-06-26 NOTE — Telephone Encounter (Signed)
Patient calling the office for samples of medication:   1.  What medication and dosage are you requesting samples for? Eliquis   2.  Are you currently out of this medication? Only has about 2 days left.    Pt saw Dr Derrel Nip recently and stated to patient that he may need an echo Please advise.

## 2015-06-26 NOTE — Telephone Encounter (Signed)
Left message for pt to call back  °

## 2015-06-27 ENCOUNTER — Other Ambulatory Visit: Payer: Self-pay | Admitting: Internal Medicine

## 2015-07-10 ENCOUNTER — Observation Stay (HOSPITAL_COMMUNITY): Payer: Medicare Other

## 2015-07-10 ENCOUNTER — Encounter: Payer: Self-pay | Admitting: Internal Medicine

## 2015-07-10 ENCOUNTER — Other Ambulatory Visit: Payer: Self-pay | Admitting: Internal Medicine

## 2015-07-10 ENCOUNTER — Other Ambulatory Visit (INDEPENDENT_AMBULATORY_CARE_PROVIDER_SITE_OTHER): Payer: Medicare Other

## 2015-07-10 ENCOUNTER — Telehealth: Payer: Self-pay

## 2015-07-10 ENCOUNTER — Telehealth: Payer: Self-pay | Admitting: Internal Medicine

## 2015-07-10 ENCOUNTER — Inpatient Hospital Stay (HOSPITAL_COMMUNITY)
Admission: EM | Admit: 2015-07-10 | Discharge: 2015-07-12 | DRG: 377 | Disposition: A | Discharge status: Home / Self Care | Payer: Medicare Other | Attending: Internal Medicine | Admitting: Internal Medicine

## 2015-07-10 ENCOUNTER — Telehealth: Payer: Self-pay | Admitting: *Deleted

## 2015-07-10 ENCOUNTER — Ambulatory Visit (INDEPENDENT_AMBULATORY_CARE_PROVIDER_SITE_OTHER): Payer: Medicare Other | Admitting: Internal Medicine

## 2015-07-10 ENCOUNTER — Encounter (HOSPITAL_COMMUNITY): Payer: Self-pay | Admitting: Emergency Medicine

## 2015-07-10 VITALS — BP 152/66 | HR 74 | Temp 97.8°F | Resp 18 | Ht 70.0 in | Wt 246.6 lb

## 2015-07-10 DIAGNOSIS — Z8673 Personal history of transient ischemic attack (TIA), and cerebral infarction without residual deficits: Secondary | ICD-10-CM | POA: Diagnosis not present

## 2015-07-10 DIAGNOSIS — I2510 Atherosclerotic heart disease of native coronary artery without angina pectoris: Secondary | ICD-10-CM | POA: Diagnosis present

## 2015-07-10 DIAGNOSIS — Z79899 Other long term (current) drug therapy: Secondary | ICD-10-CM

## 2015-07-10 DIAGNOSIS — K31819 Angiodysplasia of stomach and duodenum without bleeding: Secondary | ICD-10-CM | POA: Diagnosis not present

## 2015-07-10 DIAGNOSIS — E1165 Type 2 diabetes mellitus with hyperglycemia: Secondary | ICD-10-CM | POA: Diagnosis present

## 2015-07-10 DIAGNOSIS — M1990 Unspecified osteoarthritis, unspecified site: Secondary | ICD-10-CM | POA: Diagnosis present

## 2015-07-10 DIAGNOSIS — Z8551 Personal history of malignant neoplasm of bladder: Secondary | ICD-10-CM | POA: Diagnosis not present

## 2015-07-10 DIAGNOSIS — E785 Hyperlipidemia, unspecified: Secondary | ICD-10-CM | POA: Diagnosis present

## 2015-07-10 DIAGNOSIS — E1122 Type 2 diabetes mellitus with diabetic chronic kidney disease: Secondary | ICD-10-CM | POA: Diagnosis present

## 2015-07-10 DIAGNOSIS — I441 Atrioventricular block, second degree: Secondary | ICD-10-CM | POA: Diagnosis present

## 2015-07-10 DIAGNOSIS — I5033 Acute on chronic diastolic (congestive) heart failure: Secondary | ICD-10-CM

## 2015-07-10 DIAGNOSIS — K5790 Diverticulosis of intestine, part unspecified, without perforation or abscess without bleeding: Secondary | ICD-10-CM | POA: Diagnosis not present

## 2015-07-10 DIAGNOSIS — K922 Gastrointestinal hemorrhage, unspecified: Secondary | ICD-10-CM | POA: Diagnosis not present

## 2015-07-10 DIAGNOSIS — Z954 Presence of other heart-valve replacement: Secondary | ICD-10-CM | POA: Diagnosis not present

## 2015-07-10 DIAGNOSIS — K2971 Gastritis, unspecified, with bleeding: Secondary | ICD-10-CM | POA: Diagnosis not present

## 2015-07-10 DIAGNOSIS — N183 Chronic kidney disease, stage 3 (moderate): Secondary | ICD-10-CM | POA: Diagnosis present

## 2015-07-10 DIAGNOSIS — E1121 Type 2 diabetes mellitus with diabetic nephropathy: Secondary | ICD-10-CM | POA: Diagnosis present

## 2015-07-10 DIAGNOSIS — E11649 Type 2 diabetes mellitus with hypoglycemia without coma: Secondary | ICD-10-CM | POA: Diagnosis present

## 2015-07-10 DIAGNOSIS — E119 Type 2 diabetes mellitus without complications: Secondary | ICD-10-CM | POA: Diagnosis not present

## 2015-07-10 DIAGNOSIS — Z794 Long term (current) use of insulin: Secondary | ICD-10-CM

## 2015-07-10 DIAGNOSIS — K5520 Angiodysplasia of colon without hemorrhage: Secondary | ICD-10-CM | POA: Diagnosis not present

## 2015-07-10 DIAGNOSIS — D62 Acute posthemorrhagic anemia: Secondary | ICD-10-CM | POA: Diagnosis not present

## 2015-07-10 DIAGNOSIS — Z89422 Acquired absence of other left toe(s): Secondary | ICD-10-CM

## 2015-07-10 DIAGNOSIS — R0600 Dyspnea, unspecified: Secondary | ICD-10-CM | POA: Diagnosis not present

## 2015-07-10 DIAGNOSIS — R0602 Shortness of breath: Secondary | ICD-10-CM

## 2015-07-10 DIAGNOSIS — IMO0002 Reserved for concepts with insufficient information to code with codable children: Secondary | ICD-10-CM | POA: Diagnosis present

## 2015-07-10 DIAGNOSIS — I10 Essential (primary) hypertension: Secondary | ICD-10-CM | POA: Diagnosis not present

## 2015-07-10 DIAGNOSIS — Z7901 Long term (current) use of anticoagulants: Secondary | ICD-10-CM | POA: Diagnosis not present

## 2015-07-10 DIAGNOSIS — I5043 Acute on chronic combined systolic (congestive) and diastolic (congestive) heart failure: Secondary | ICD-10-CM | POA: Diagnosis present

## 2015-07-10 DIAGNOSIS — Z86010 Personal history of colonic polyps: Secondary | ICD-10-CM | POA: Diagnosis not present

## 2015-07-10 DIAGNOSIS — I130 Hypertensive heart and chronic kidney disease with heart failure and stage 1 through stage 4 chronic kidney disease, or unspecified chronic kidney disease: Secondary | ICD-10-CM | POA: Diagnosis present

## 2015-07-10 DIAGNOSIS — Z89421 Acquired absence of other right toe(s): Secondary | ICD-10-CM

## 2015-07-10 DIAGNOSIS — Z87891 Personal history of nicotine dependence: Secondary | ICD-10-CM | POA: Diagnosis not present

## 2015-07-10 DIAGNOSIS — Z951 Presence of aortocoronary bypass graft: Secondary | ICD-10-CM

## 2015-07-10 DIAGNOSIS — E875 Hyperkalemia: Secondary | ICD-10-CM | POA: Diagnosis present

## 2015-07-10 DIAGNOSIS — N400 Enlarged prostate without lower urinary tract symptoms: Secondary | ICD-10-CM | POA: Diagnosis present

## 2015-07-10 DIAGNOSIS — Z953 Presence of xenogenic heart valve: Secondary | ICD-10-CM | POA: Diagnosis not present

## 2015-07-10 DIAGNOSIS — H3530 Unspecified macular degeneration: Secondary | ICD-10-CM | POA: Diagnosis present

## 2015-07-10 DIAGNOSIS — I482 Chronic atrial fibrillation: Secondary | ICD-10-CM | POA: Diagnosis present

## 2015-07-10 DIAGNOSIS — D649 Anemia, unspecified: Secondary | ICD-10-CM | POA: Diagnosis not present

## 2015-07-10 DIAGNOSIS — K921 Melena: Secondary | ICD-10-CM | POA: Diagnosis not present

## 2015-07-10 HISTORY — DX: Cerebral infarction, unspecified: I63.9

## 2015-07-10 LAB — CBC WITH DIFFERENTIAL/PLATELET
BASOS ABS: 0.1 10*3/uL (ref 0.0–0.1)
Basophils Relative: 0.9 % (ref 0.0–3.0)
EOS PCT: 3.6 % (ref 0.0–5.0)
Eosinophils Absolute: 0.2 10*3/uL (ref 0.0–0.7)
HCT: 24.3 % — ABNORMAL LOW (ref 39.0–52.0)
Lymphocytes Relative: 10.2 % — ABNORMAL LOW (ref 12.0–46.0)
Lymphs Abs: 0.7 10*3/uL (ref 0.7–4.0)
MCHC: 32.1 g/dL (ref 30.0–36.0)
MCV: 88 fl (ref 78.0–100.0)
MONOS PCT: 10.7 % (ref 3.0–12.0)
Monocytes Absolute: 0.7 10*3/uL (ref 0.1–1.0)
Neutro Abs: 5 10*3/uL (ref 1.4–7.7)
Neutrophils Relative %: 74.6 % (ref 43.0–77.0)
Platelets: 223 10*3/uL (ref 150.0–400.0)
RBC: 2.76 Mil/uL — AB (ref 4.22–5.81)
RDW: 16.9 % — ABNORMAL HIGH (ref 11.5–15.5)
WBC: 6.8 10*3/uL (ref 4.0–10.5)

## 2015-07-10 LAB — GLUCOSE, CAPILLARY
GLUCOSE-CAPILLARY: 281 mg/dL — AB (ref 65–99)
Glucose-Capillary: 297 mg/dL — ABNORMAL HIGH (ref 65–99)

## 2015-07-10 LAB — COMPREHENSIVE METABOLIC PANEL
ALT: 17 U/L (ref 17–63)
AST: 23 U/L (ref 15–41)
Albumin: 3.4 g/dL — ABNORMAL LOW (ref 3.5–5.0)
Alkaline Phosphatase: 107 U/L (ref 38–126)
Anion gap: 10 (ref 5–15)
BUN: 43 mg/dL — AB (ref 6–20)
CHLORIDE: 99 mmol/L — AB (ref 101–111)
CO2: 24 mmol/L (ref 22–32)
Calcium: 8.8 mg/dL — ABNORMAL LOW (ref 8.9–10.3)
Creatinine, Ser: 2.11 mg/dL — ABNORMAL HIGH (ref 0.61–1.24)
GFR calc Af Amer: 31 mL/min — ABNORMAL LOW (ref >60)
GFR calc non Af Amer: 27 mL/min — ABNORMAL LOW (ref >60)
GLUCOSE: 345 mg/dL — AB (ref 65–99)
POTASSIUM: 5.3 mmol/L — AB (ref 3.5–5.1)
Sodium: 133 mmol/L — ABNORMAL LOW (ref 135–145)
Total Bilirubin: 0.8 mg/dL (ref 0.3–1.2)
Total Protein: 6.5 g/dL (ref 6.5–8.1)

## 2015-07-10 LAB — CBC
HEMATOCRIT: 24.2 % — AB (ref 39.0–52.0)
Hemoglobin: 7.6 g/dL — ABNORMAL LOW (ref 13.0–17.0)
MCH: 27.7 pg (ref 26.0–34.0)
MCHC: 31.4 g/dL (ref 30.0–36.0)
MCV: 88.3 fL (ref 78.0–100.0)
Platelets: 235 10*3/uL (ref 150–400)
RBC: 2.74 MIL/uL — ABNORMAL LOW (ref 4.22–5.81)
RDW: 15.6 % — AB (ref 11.5–15.5)
WBC: 6.8 10*3/uL (ref 4.0–10.5)

## 2015-07-10 LAB — HEMOCCULT GUIAC POC 1CARD (OFFICE): FECAL OCCULT BLD: POSITIVE — AB

## 2015-07-10 LAB — BRAIN NATRIURETIC PEPTIDE: Pro B Natriuretic peptide (BNP): 211 pg/mL — ABNORMAL HIGH (ref 0.0–100.0)

## 2015-07-10 LAB — TROPONIN I: Troponin I: 0.04 ng/mL — ABNORMAL HIGH (ref <0.031)

## 2015-07-10 LAB — CBG MONITORING, ED: Glucose-Capillary: 308 mg/dL — ABNORMAL HIGH (ref 65–99)

## 2015-07-10 LAB — PREPARE RBC (CROSSMATCH)

## 2015-07-10 LAB — D-DIMER, QUANTITATIVE (NOT AT ARMC): D DIMER QUANT: 0.27 ug{FEU}/mL (ref 0.00–0.48)

## 2015-07-10 MED ORDER — AMLODIPINE BESYLATE 5 MG PO TABS
5.0000 mg | ORAL_TABLET | Freq: Every day | ORAL | Status: DC
  Administered 2015-07-11 – 2015-07-12 (×2): 5 mg via ORAL
  Filled 2015-07-10 (×2): qty 1

## 2015-07-10 MED ORDER — FERROUS SULFATE 325 (65 FE) MG PO TABS
325.0000 mg | ORAL_TABLET | Freq: Every day | ORAL | Status: DC
  Administered 2015-07-11: 325 mg via ORAL
  Filled 2015-07-10: qty 1

## 2015-07-10 MED ORDER — TAMSULOSIN HCL 0.4 MG PO CAPS
0.4000 mg | ORAL_CAPSULE | Freq: Every day | ORAL | Status: DC
  Administered 2015-07-10 – 2015-07-12 (×3): 0.4 mg via ORAL
  Filled 2015-07-10 (×3): qty 1

## 2015-07-10 MED ORDER — ONDANSETRON HCL 4 MG/2ML IJ SOLN: 4.0000 mg | Freq: Four times a day (QID) | INTRAMUSCULAR | Status: DC | PRN

## 2015-07-10 MED ORDER — INSULIN ASPART 100 UNIT/ML ~~LOC~~ SOLN
0.0000 [IU] | SUBCUTANEOUS | Status: DC
  Administered 2015-07-10: 5 [IU] via SUBCUTANEOUS
  Administered 2015-07-11: 1 [IU] via SUBCUTANEOUS
  Administered 2015-07-11: 3 [IU] via SUBCUTANEOUS

## 2015-07-10 MED ORDER — PANTOPRAZOLE SODIUM 40 MG IV SOLR
40.0000 mg | Freq: Two times a day (BID) | INTRAVENOUS | Status: DC
  Administered 2015-07-10 – 2015-07-11 (×2): 40 mg via INTRAVENOUS
  Filled 2015-07-10 (×2): qty 40

## 2015-07-10 MED ORDER — FUROSEMIDE 10 MG/ML IJ SOLN
20.0000 mg | Freq: Every day | INTRAMUSCULAR | Status: DC
  Administered 2015-07-11 – 2015-07-12 (×2): 20 mg via INTRAVENOUS
  Filled 2015-07-10 (×2): qty 2

## 2015-07-10 MED ORDER — ACETAMINOPHEN 325 MG PO TABS: 650.0000 mg | ORAL_TABLET | Freq: Four times a day (QID) | ORAL | Status: DC | PRN

## 2015-07-10 MED ORDER — ONDANSETRON HCL 4 MG PO TABS: 4.0000 mg | ORAL_TABLET | Freq: Four times a day (QID) | ORAL | Status: DC | PRN

## 2015-07-10 MED ORDER — CARVEDILOL 6.25 MG PO TABS
6.2500 mg | ORAL_TABLET | Freq: Two times a day (BID) | ORAL | Status: DC
  Administered 2015-07-11 – 2015-07-12 (×4): 6.25 mg via ORAL
  Filled 2015-07-10 (×4): qty 1

## 2015-07-10 MED ORDER — ACETAMINOPHEN 650 MG RE SUPP: 650.0000 mg | Freq: Four times a day (QID) | RECTAL | Status: DC | PRN

## 2015-07-10 MED ORDER — INSULIN GLARGINE 100 UNIT/ML ~~LOC~~ SOLN
20.0000 [IU] | Freq: Every day | SUBCUTANEOUS | Status: DC
Start: 2015-07-11 — End: 2015-07-12
  Administered 2015-07-11 – 2015-07-12 (×2): 20 [IU] via SUBCUTANEOUS
  Filled 2015-07-10 (×2): qty 0.2

## 2015-07-10 MED ORDER — SODIUM CHLORIDE 0.9 % IV SOLN: Freq: Once | INTRAVENOUS | Status: DC

## 2015-07-10 MED ORDER — ONDANSETRON HCL 4 MG/2ML IJ SOLN: 4.0000 mg | Freq: Three times a day (TID) | INTRAMUSCULAR | Status: DC | PRN

## 2015-07-10 MED ORDER — FUROSEMIDE 10 MG/ML IJ SOLN
20.0000 mg | Freq: Once | INTRAMUSCULAR | Status: AC
  Administered 2015-07-10: 20 mg via INTRAVENOUS
  Filled 2015-07-10: qty 2

## 2015-07-10 MED ORDER — SODIUM CHLORIDE 0.9% FLUSH
3.0000 mL | Freq: Two times a day (BID) | INTRAVENOUS | Status: DC
  Administered 2015-07-11 – 2015-07-12 (×3): 3 mL via INTRAVENOUS

## 2015-07-10 NOTE — Progress Notes (Signed)
Pt arrived to floor, SOB on exertion, BP elevated, Pt assistx1 to scale and bed, high fall risk. Dr. Hal Hope notified of elevated BP and that pt requesting food. Ordered to keep pt NPO for now. Will continue to monitor. Ronnette Hila, RN

## 2015-07-10 NOTE — ED Notes (Addendum)
Patient reports having GI bleeding and gradual decrease in Hgb since November - states his hemoglobin 3 weeks ago was 9-something. Pt went to see PCP today to have blood drawn and was sent here after hemoglobin was 7.6. Patient denies pain, no N/V/D. Pt hx A-Fib, on Eliquis.

## 2015-07-10 NOTE — ED Notes (Addendum)
Pt sts increased generalized weakness and low hgb with rectal bleeding; pt sts takes blood thinners

## 2015-07-10 NOTE — Assessment & Plan Note (Addendum)
Secondary to acute drop in hgb to 7.8 ,complicated by atrial fibrillation, severe aortic stenosis and CKD  .  Wt gain of 5 lbs noted this week.  Advising patient to go to [facility] ER for admission

## 2015-07-10 NOTE — Telephone Encounter (Signed)
Patient  Need sot be scheduled for blood transfusion  ASAP  ,  He is short of breath and hgb has dropped again to 7.8   Sees [facility] at [facility]
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
## 2015-07-10 NOTE — Telephone Encounter (Signed)
Pt does report that his [facility] nurse is sched to see him soon, so he wants [place] to know that he will have someone w/ him. He will ask her to "take at look at him" and offer her opinion if pt can wait until tomorrow's appt.

## 2015-07-10 NOTE — Progress Notes (Signed)
Pre visit review using our clinic review tool, if applicable. No additional management support is needed unless otherwise documented below in the visit note. 

## 2015-07-10 NOTE — Telephone Encounter (Signed)
I don't understand .  I had an 11:15 today and a 5:30 because  both patients cancelled.  Why was he not offered either?
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
## 2015-07-10 NOTE — Telephone Encounter (Signed)
Hemoglobin  : 7.8

## 2015-07-10 NOTE — ED Notes (Signed)
Informed consent for blood administration at bedside.

## 2015-07-10 NOTE — Telephone Encounter (Signed)
STAT labs results, available in EPIC for review. D-Dimer (Normal) at 0.27.   Please advise?

## 2015-07-10 NOTE — Telephone Encounter (Signed)
Patient [name] DOB: [birth year] Initial Comment caller states he is having difficulty breathing Nurse Assessment Nurse: Ronnald Ramp, RN, [place] Date/Time (Eastern Time): 07/10/2015 11:01:22 AM Confirm and document reason for call. If symptomatic, describe symptoms. You must click the next button to save text entered. ---Caller states he is having increased SOB. He has seen MD for this a couple of weeks ago but it is getting worse. No increase in weight. Has the patient traveled out of the country within the last 30 days? ---Not Applicable Does the patient have any new or worsening symptoms? ---Yes Will a triage be completed? ---Yes Related visit to physician within the last 2 weeks? ---No Does the PT have any chronic conditions? (i.e. diabetes, asthma, etc.) ---Yes List chronic conditions. ---Diabetes, Kidney disease, CAD, hx of blood clot in leg that lead to an amputation of his toe Is this a behavioral health or substance abuse call? ---No Guidelines Guideline Title Affirmed Question Affirmed Notes Breathing Difficulty History of prior "blood clot" in leg or lungs (i.e., deep vein thrombosis, pulmonary embolism) Final Disposition User Go to ED Now Ronnald Ramp, RN, [facility] - ED Disagree/Comply: Comply

## 2015-07-10 NOTE — Telephone Encounter (Signed)
Dr. Rockey Situ had cancellation tomorrow.  Front desk notified pt, he states that PCP advised him to proceed to ED, so he is unsure of what to do. Advised pt to make appt w/ Dr. Rockey Situ tomorrow for evaluation, but to proceed to ED if he feels his sx are emergent.  Will make pt's son, Dr. Acie Fredrickson, aware.

## 2015-07-10 NOTE — Telephone Encounter (Signed)
Patient coming in for labs at 1.30.

## 2015-07-10 NOTE — Telephone Encounter (Signed)
Pt going to ER FYI

## 2015-07-10 NOTE — Assessment & Plan Note (Signed)
With UGI bleed in Nov 2016 requiring hospitalization and transfusion in Nov 2016.  Likely rebleeding given positive FOBT Today

## 2015-07-10 NOTE — Assessment & Plan Note (Addendum)
Heme positive stool on today's fecal occult blood testing and his hemoglobin has dropped to 7.8  He has a history of angiodysplasia found on EGD during November hospitalization  And is on Eliquis for histrory of atrial fibrillation and prior CVA . His drop in hgb is causing a decompensated heart failure due to chronic atrial fibrillation  With diastolic dysfunction and he has gained 5 lbs this week .  His oncologist has refused to transfuse him for hgb < 8 so I am recommending he go to [facility] ER for admission I have spoken with his son, cardiologist Mertie Moores and advised him of my plan.

## 2015-07-10 NOTE — Telephone Encounter (Signed)
Pt has appt w/ Dr. Rockey Situ on 07/27/15.  Added to waiting list in he event of cancellation w/ Dr. Rockey Situ, Thurmond Butts or [place].

## 2015-07-10 NOTE — Telephone Encounter (Signed)
Spoke to [place] at [facility] oncology Dr. Gwenlyn Perking office. She stated that they will not do a blood transfusion until his hgb is at 7 or lower, also he has an appt Wednesday with them for labs and said that if they are low at that time they will do the transfusion at that time.

## 2015-07-10 NOTE — Telephone Encounter (Signed)
The more important stat lab is the critical hgb of 7.8 . patient seen and set to ER

## 2015-07-10 NOTE — Telephone Encounter (Signed)
Pt c/o Shortness Of Breath: STAT if SOB developed within the last 24 hours or pt is noticeably SOB on the phone  1. Are you currently SOB (can you hear that pt is SOB on the phone)? no  2. How long have you been experiencing SOB? About a week or 2  3. Are you SOB when sitting or when up moving around? Up moving around, sitting down is fine  4. Are you currently experiencing any other symptoms? no  States his PCP told him he needed to be seen soon. Please call.

## 2015-07-10 NOTE — Progress Notes (Signed)
Subjective:  Patient ID: [name], male    DOB: 06-23-1927  Age: 82 y.o. MRN: [id number]  CC: The primary encounter diagnosis was Acute blood loss anemia. Diagnoses of Acute blood loss anemia, Acute on chronic diastolic heart failure ([place]), and Angiodysplasia of stomach were also pertinent to this visit.  HPI [name] presents for signs and symptoms of decompensated heart failure secondary to recurrent drop in hemoglobin. .  Patient reports dyspnea with minimal exertion for the past 3-4 days and a weight gain of 5 lbs over the last 5 days . He used an albuterol MDI borrowed from granddaughter bc he was reportedly  wheezing audibly and it helped transiently but symptoms returned after a few hours .       Stools have been dark,  He denies abdominal  pain but has developed increased abdominal girth over the last few days, .  He has a history of an  UGI bleed secondary to angiodysplasia requiring hospitalization and transfusion in November   Outpatient Prescriptions Prior to Visit  Medication Sig Dispense Refill  . amLODipine (NORVASC) 5 MG tablet Take 1 tablet (5 mg total) by mouth daily. 30 tablet 2  . apixaban (ELIQUIS) 2.5 MG TABS tablet Take 1 tablet (2.5 mg total) by mouth 2 (two) times daily. Start on 04/20/15    . Ascorbic Acid (VITAMIN C PO) Take 500 mg by mouth daily.     . carvedilol (COREG) 12.5 MG tablet Take 0.5 tablets (6.25 mg total) by mouth 2 (two) times daily with a meal.    . Cholecalciferol (VITAMIN D PO) Take 1 tablet by mouth 3 (three) times a week.    . Cholecalciferol (VITAMIN D3) 1000 UNITS CAPS Take 1 capsule by mouth daily.    . ferrous sulfate 325 (65 FE) MG tablet Take 325 mg by mouth daily with breakfast.    . furosemide (LASIX) 40 MG tablet TAKE ONE-HALF TABLET DAILY 30 tablet 5  . insulin lispro (HUMALOG) 100 UNIT/ML injection Inject into the skin 4 (four) times daily - after meals and at bedtime. As directed by sliding scale    . Insulin Pen Needle  31G X 5 MM MISC Use as directed 100 each 5  . isosorbide mononitrate (IMDUR) 30 MG 24 hr tablet Take 1 tablet (30 mg total) by mouth daily. 30 tablet 0  . LANTUS 100 UNIT/ML injection INJECT 0.5ML (50 UNITS) INTO THE SKIN DAILY 10 mL 6  . Multiple Vitamin (MULTI-VITAMIN PO) Take by mouth.      . Multiple Vitamins-Minerals (PRESERVISION AREDS PO) Take 1 capsule by mouth daily.    . pantoprazole (PROTONIX) 40 MG tablet TAKE 1 TABLET EVERY DAY 30 tablet 11  . polyethylene glycol powder (GLYCOLAX/MIRALAX) powder Take 255 g by mouth daily. (Patient taking differently: Take 1 Container by mouth daily as needed. ) 255 g 3  . tamsulosin (FLOMAX) 0.4 MG CAPS capsule TAKE ONE CAPSULE DAILY AFTER SUPPER 30 capsule 3   No facility-administered medications prior to visit.    Review of Systems;  Patient denies headache, fevers, malaise, unintentional weight loss, skin rash, eye pain, sinus congestion and sinus pain, sore throat, dysphagia,  hemoptysis , cough, chest pain, palpitations, , , abdominal pain, nausea,  diarrhea, constipation, flank pain, dysuria, hematuria, urinary  Frequency, nocturia, numbness, tingling, seizures,  Focal weakness, Loss of consciousness,  Tremor, insomnia, depression, anxiety, and suicidal ideation.      Objective:  BP 152/66 mmHg  Pulse 74  Temp(Src)  97.8 F (36.6 C) (Oral)  Resp 18  Ht 5\' 10"  (1.778 m)  Wt 246 lb 9.6 oz (111.857 kg)  BMI 35.38 kg/m2  SpO2 95%  BP Readings from Last 3 Encounters:  07/10/15 152/66  06/21/15 128/62  05/31/15 162/83    Wt Readings from Last 3 Encounters:  07/10/15 246 lb 9.6 oz (111.857 kg)  06/21/15 238 lb 2 oz (108.013 kg)  05/31/15 236 lb 15.9 oz (107.5 kg)    General appearance: alert, cooperative and appears stated age Ears: normal TM's and external ear canals both ears Throat: lips, mucosa, and tongue normal; teeth and gums normal Neck: no adenopathy, no carotid bruit, supple, symmetrical, trachea midline and thyroid  not enlarged, symmetric, no tenderness/mass/nodules Back: symmetric, no curvature. ROM normal. No CVA tenderness. Lungs: clear to auscultation bilaterally Heart: regular rate and rhythm, S1, S2 normal, no murmur, click, rub or gallop Abdomen: soft, non-tender; bowel sounds normal; no masses,  no organomegaly Pulses: 2+ and symmetric Skin: Skin color, texture, turgor normal. No rashes or lesions Lymph nodes: Cervical, supraclavicular, and axillary nodes normal.  Lab Results  Component Value Date   HGBA1C 6.9* 06/22/2015   HGBA1C 7.1* 03/06/2015   HGBA1C 6.5 01/18/2015    Lab Results  Component Value Date   CREATININE 1.94* 06/22/2015   CREATININE 2.0* 04/24/2015   CREATININE 2.09* 04/17/2015    Lab Results  Component Value Date   WBC 6.8 07/10/2015   HGB 7.8 cL* 07/10/2015   HCT 24.3 aL* 07/10/2015   PLT 223.0 07/10/2015   GLUCOSE 191* 06/22/2015   CHOL 129 01/18/2015   TRIG 58.0 01/18/2015   HDL 45.00 01/18/2015   LDLDIRECT 56.0 06/22/2015   LDLCALC 72 01/18/2015   ALT 12 06/22/2015   AST 20 06/22/2015   NA 135 06/22/2015   K 5.1 06/22/2015   CL 102 06/22/2015   CREATININE 1.94* 06/22/2015   BUN 39* 06/22/2015   CO2 27 06/22/2015   TSH 3.53 09/07/2014   PSA 0.48 09/07/2014   INR 1.20 04/12/2015   HGBA1C 6.9* 06/22/2015   MICROALBUR 70.5* 06/21/2015    Dg Chest 2 View  04/12/2015  CLINICAL DATA:  Shortness of breath, weakness. EXAM: CHEST  2 VIEW COMPARISON:  06/29/2014 FINDINGS: Prior valve replacement. Cardiomegaly. No confluent airspace opacities, effusions or edema. Degenerative changes in the thoracic spine. No acute bony abnormality. IMPRESSION: Cardiomegaly.  No active disease. Electronically Signed   By: Rolm Baptise M.D.   On: 04/12/2015 16:42    Assessment & Plan:   Acute blood loss anemia Heme positive stool on today's fecal occult blood testing and his hemoglobin has dropped to 7.8  He has a history of angiodysplasia found on EGD during November  hospitalization  And is on Eliquis for history of atrial fibrillation and prior CVA . His drop in hgb is causing a decompensated heart failure due to chronic atrial fibrillation  With diastolic dysfunction and he has gained 5 lbs this week .  His oncologist has declined to transfuse him for hgb < 8 so I am recommending he go to [facility] ER for admission.  I have spoken with his son, cardiologist Mertie Moores and advised him of my plan.    Acute on chronic diastolic heart failure (HCC) Secondary to acute drop in hgb to 7.8 ,complicated by atrial fibrillation,  and CKD  .  Wt gain of 5 lbs noted this week.  Advising patient to go to [facility] ER for admission   Angiodysplasia  of stomach With UGI bleed in Nov 2016 requiring hospitalization and transfusion in Nov 2016.  Likely rebleeding given positive FOBT Today  A total of 40 minutes was spent with patient more than half of which was spent in counseling patient on the above mentioned issues , reviewing and explaining recent labs and imaging studies done, and coordination of care. Updated Medication List Outpatient Encounter Prescriptions as of 07/10/2015  Medication Sig  . amLODipine (NORVASC) 5 MG tablet Take 1 tablet (5 mg total) by mouth daily.  [name] apixaban (ELIQUIS) 2.5 MG TABS tablet Take 1 tablet (2.5 mg total) by mouth 2 (two) times daily. Start on 04/20/15  . Ascorbic Acid (VITAMIN C PO) Take 500 mg by mouth daily.   . carvedilol (COREG) 12.5 MG tablet Take 0.5 tablets (6.25 mg total) by mouth 2 (two) times daily with a meal.  . Cholecalciferol (VITAMIN D PO) Take 1 tablet by mouth 3 (three) times a week.  . Cholecalciferol (VITAMIN D3) 1000 UNITS CAPS Take 1 capsule by mouth daily.  . ferrous sulfate 325 (65 FE) MG tablet Take 325 mg by mouth daily with breakfast.  . furosemide (LASIX) 40 MG tablet TAKE ONE-HALF TABLET DAILY  . insulin lispro (HUMALOG) 100 UNIT/ML injection Inject into the skin 4 (four) times daily - after meals and at  bedtime. As directed by sliding scale  . Insulin Pen Needle 31G X 5 MM MISC Use as directed  . isosorbide mononitrate (IMDUR) 30 MG 24 hr tablet Take 1 tablet (30 mg total) by mouth daily.  [name] LANTUS 100 UNIT/ML injection INJECT 0.5ML (50 UNITS) INTO THE SKIN DAILY  . Multiple Vitamin (MULTI-VITAMIN PO) Take by mouth.    . Multiple Vitamins-Minerals (PRESERVISION AREDS PO) Take 1 capsule by mouth daily.  . pantoprazole (PROTONIX) 40 MG tablet TAKE 1 TABLET EVERY DAY  . polyethylene glycol powder (GLYCOLAX/MIRALAX) powder Take 255 g by mouth daily. (Patient taking differently: Take 1 Container by mouth daily as needed. )  . tamsulosin (FLOMAX) 0.4 MG CAPS capsule TAKE ONE CAPSULE DAILY AFTER SUPPER   No facility-administered encounter medications on file as of 07/10/2015.     I am having Mr. Strieter maintain his Ascorbic Acid (VITAMIN C PO), Multiple Vitamin (MULTI-VITAMIN PO), Insulin Pen Needle, Multiple Vitamins-Minerals (PRESERVISION AREDS PO), pantoprazole, Cholecalciferol (VITAMIN D PO), ferrous sulfate, carvedilol, apixaban, isosorbide mononitrate, insulin lispro, polyethylene glycol powder, Vitamin D3, amLODipine, furosemide, tamsulosin, and LANTUS.  No orders of the defined types were placed in this encounter.    There are no discontinued medications.  Follow-up: No Follow-up on file.   [name], MD

## 2015-07-10 NOTE — Telephone Encounter (Signed)
[name] called and could barely finish the sentence and was very winded on the phone and when I tried to offer him an appointment he was disconnected. I asked [name] if we could get him in and I was told to send him to Triage. He stated that he had tried to see his Cardiologist before calling here.

## 2015-07-10 NOTE — Telephone Encounter (Signed)
Patient coming in at 5.30

## 2015-07-10 NOTE — Telephone Encounter (Signed)
Spoke w/ pt.  He reports that he is having some SOB, had a birthday party this weekend and his son, Dr. Acie Fredrickson, advised him that he needs to see someone today. Advised him that Dr. Rockey Situ has no openings today, but I have placed him on the wait list in the event of a cancellation.  He states that he will call Dr. Derrel Nip and see if she has any openings in the meantime.

## 2015-07-11 ENCOUNTER — Ambulatory Visit: Payer: Medicare Other | Admitting: Cardiovascular Disease

## 2015-07-11 ENCOUNTER — Encounter (HOSPITAL_COMMUNITY): Payer: Self-pay | Admitting: Physician Assistant

## 2015-07-11 DIAGNOSIS — E1121 Type 2 diabetes mellitus with diabetic nephropathy: Secondary | ICD-10-CM

## 2015-07-11 DIAGNOSIS — Z954 Presence of other heart-valve replacement: Secondary | ICD-10-CM

## 2015-07-11 DIAGNOSIS — K922 Gastrointestinal hemorrhage, unspecified: Secondary | ICD-10-CM

## 2015-07-11 LAB — CBC
HCT: 28.9 % — ABNORMAL LOW (ref 39.0–52.0)
HEMOGLOBIN: 9.1 g/dL — AB (ref 13.0–17.0)
MCH: 27.6 pg (ref 26.0–34.0)
MCHC: 31.5 g/dL (ref 30.0–36.0)
MCV: 87.6 fL (ref 78.0–100.0)
Platelets: 212 10*3/uL (ref 150–400)
RBC: 3.3 MIL/uL — AB (ref 4.22–5.81)
RDW: 15.3 % (ref 11.5–15.5)
WBC: 5.9 10*3/uL (ref 4.0–10.5)

## 2015-07-11 LAB — GLUCOSE, CAPILLARY
GLUCOSE-CAPILLARY: 130 mg/dL — AB (ref 65–99)
GLUCOSE-CAPILLARY: 89 mg/dL (ref 65–99)
GLUCOSE-CAPILLARY: 465 mg/dL — AB (ref 65–99)
Glucose-Capillary: 89 mg/dL (ref 65–99)
Glucose-Capillary: 233 mg/dL — ABNORMAL HIGH (ref 65–99)
Glucose-Capillary: 269 mg/dL — ABNORMAL HIGH (ref 65–99)

## 2015-07-11 LAB — BASIC METABOLIC PANEL
Anion gap: 10 (ref 5–15)
BUN: 39 mg/dL — ABNORMAL HIGH (ref 6–20)
CHLORIDE: 100 mmol/L — AB (ref 101–111)
CO2: 27 mmol/L (ref 22–32)
CREATININE: 1.95 mg/dL — AB (ref 0.61–1.24)
Calcium: 9 mg/dL (ref 8.9–10.3)
GFR, EST AFRICAN AMERICAN: 34 mL/min — AB (ref >60)
GFR, EST NON AFRICAN AMERICAN: 29 mL/min — AB (ref >60)
Glucose, Bld: 124 mg/dL — ABNORMAL HIGH (ref 65–99)
Potassium: 4.1 mmol/L (ref 3.5–5.1)
SODIUM: 137 mmol/L (ref 135–145)

## 2015-07-11 MED ORDER — PEG-KCL-NACL-NASULF-NA ASC-C 100 G PO SOLR: 1.0000 | Freq: Once | ORAL | Status: DC

## 2015-07-11 MED ORDER — BISACODYL 5 MG PO TBEC
10.0000 mg | DELAYED_RELEASE_TABLET | Freq: Four times a day (QID) | ORAL | Status: AC
  Administered 2015-07-11 (×2): 10 mg via ORAL
  Filled 2015-07-11 (×2): qty 2

## 2015-07-11 MED ORDER — INSULIN ASPART 100 UNIT/ML ~~LOC~~ SOLN
3.0000 [IU] | Freq: Three times a day (TID) | SUBCUTANEOUS | Status: DC
  Administered 2015-07-12 (×2): 3 [IU] via SUBCUTANEOUS

## 2015-07-11 MED ORDER — PANTOPRAZOLE SODIUM 40 MG PO TBEC
40.0000 mg | DELAYED_RELEASE_TABLET | Freq: Every day | ORAL | Status: DC
  Administered 2015-07-12: 40 mg via ORAL
  Filled 2015-07-11 (×2): qty 1

## 2015-07-11 MED ORDER — INSULIN GLARGINE 100 UNIT/ML ~~LOC~~ SOLN
15.0000 [IU] | Freq: Once | SUBCUTANEOUS | Status: AC
  Administered 2015-07-11: 15 [IU] via SUBCUTANEOUS
  Filled 2015-07-11: qty 0.15

## 2015-07-11 MED ORDER — ALBUTEROL SULFATE (2.5 MG/3ML) 0.083% IN NEBU: 3.0000 mL | INHALATION_SOLUTION | Freq: Four times a day (QID) | RESPIRATORY_TRACT | Status: DC | PRN

## 2015-07-11 MED ORDER — PEG-KCL-NACL-NASULF-NA ASC-C 100 G PO SOLR
0.5000 | Freq: Once | ORAL | Status: AC
  Administered 2015-07-11: 100 g via ORAL
  Filled 2015-07-11: qty 1

## 2015-07-11 MED ORDER — INSULIN ASPART 100 UNIT/ML ~~LOC~~ SOLN
0.0000 [IU] | Freq: Three times a day (TID) | SUBCUTANEOUS | Status: DC
  Administered 2015-07-11: 15 [IU] via SUBCUTANEOUS
  Administered 2015-07-12: 3 [IU] via SUBCUTANEOUS
  Administered 2015-07-12: 5 [IU] via SUBCUTANEOUS

## 2015-07-11 MED ORDER — INSULIN ASPART 100 UNIT/ML ~~LOC~~ SOLN
0.0000 [IU] | Freq: Every day | SUBCUTANEOUS | Status: DC
  Administered 2015-07-11: 3 [IU] via SUBCUTANEOUS

## 2015-07-11 NOTE — Progress Notes (Signed)
Pt CBG 465. Dr. Eliseo Squires paged, ordered for max dose of insulin to be given via sliding scale. Latus will be ordered. Will continue to monitor pt.

## 2015-07-11 NOTE — Evaluation (Signed)
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   Clinical Impression  Patient presents with decreased strength, endurance, dyspnea on exertion and decreased activity tolerance impacting mobility. Tolerated ambulation with Min guard assist for safety with use of RW. Pt has an aide to assist with ADL/IADLs and pt participates in exercise class 2x/week. Encouraged daily ambulation 3 times per day with RN to maximize mobility while in hospital. Needs to negotiate steps prior to discharge. Would benefit from cardiopulmonary rehab. Will follow acutely to maximize independence and mobility prior to return home.    Follow Up Recommendations Outpatient PT;Supervision - Intermittent (pulmonary rehab)    Equipment Recommendations  None recommended by PT    Recommendations for Other Services OT consult     Precautions / Restrictions Precautions Precautions: Fall Restrictions Weight Bearing Restrictions: No      Mobility  Bed Mobility               General bed mobility comments: Sitting EOB upon PT arrival.   Transfers Overall transfer level: Needs assistance Equipment used: Rolling walker (2 wheeled) Transfers: Sit to/from Stand Sit to Stand: Min guard         General transfer comment: Multiple attempts to stand from EOB using body momentum and RW for support. Bracing LEs against bed for support.   Ambulation/Gait Ambulation/Gait assistance: Min guard Ambulation Distance (Feet): 150 Feet Assistive device: Rolling walker (2 wheeled) Gait Pattern/deviations: Step-through pattern;Decreased stride length;Trunk flexed Gait velocity: decreased   General Gait Details: Slow, mostly steady gait using RW. 3/4 DOE. 2 short standing rest  breaks due to fatigue, SOB. VSS. HR in the 90s  Stairs            Wheelchair Mobility    Modified Rankin (Stroke Patients Only)       Balance Overall balance assessment: Needs assistance Sitting-balance support: Feet supported;No upper extremity supported Sitting balance-[name]: Good     Standing balance support: During functional activity Standing balance-[name]: Poor Standing balance comment: Reliant on RW for support.                              Pertinent Vitals/Pain Pain Assessment: No/denies pain    Home Living Family/patient expects to be discharged to:: Private residence Living Arrangements: Alone Available Help at Discharge: Personal care attendant;Available PRN/intermittently Type of Home: House Home Access: Stairs to enter Entrance Stairs-Rails: Left Entrance Stairs-Number of Steps: 3 Home Layout: Two level;Bed/bath upstairs Home Equipment: Cane - single point;Walker - 2 wheels;Shower seat Additional Comments: Pt has grab bars for toilet and shower. Pt has walk in shower and tub/shower combo. He owns shower chair.      Prior Function Level of Independence: Needs assistance   Gait / Transfers Assistance Needed: Uses SPC for household ambulation vs furniture walking and RW for community ambulatin "so I don't fall." No falls reported.   ADL's / Homemaking Assistance Needed: Some assist with washing back, bathing and dressing from PCA.  Comments: son is MD; was a Insurance underwriter. PCA comes every afternoon for a few hours- takes him to appt and to exercise class twice/week.     Hand Dominance   Dominant Hand: Right    Extremity/Trunk  Assessment   Upper Extremity Assessment: Defer to OT evaluation;LUE deficits/detail       LUE Deficits / Details: Weak grip - residual from prior CVA   Lower Extremity Assessment: Generalized weakness         Communication   Communication: No difficulties  Cognition Arousal/Alertness:  Awake/alert Behavior During Therapy: WFL for tasks assessed/performed Overall Cognitive Status: Within Functional Limits for tasks assessed                      General Comments General comments (skin integrity, edema, etc.): Daughter in law present during session.    Exercises        Assessment/Plan    PT Assessment Patient needs continued PT services  PT Diagnosis Difficulty walking   PT Problem List Decreased strength;Cardiopulmonary status limiting activity;Decreased activity tolerance;Decreased balance;Decreased mobility;Decreased coordination  PT Treatment Interventions Balance training;Gait training;Functional mobility training;Therapeutic activities;Therapeutic exercise;Patient/family education;Stair training   PT Goals (Current goals can be found in the Care Plan section) Acute Rehab PT Goals Patient Stated Goal: to return to exercise class and to go home PT Goal Formulation: With patient Time For Goal Achievement: 07/25/15 Potential to Achieve Goals: Good    Frequency Min 3X/week   Barriers to discharge Inaccessible home environment many steps to get into home and to get upstairs to bedroom    Co-evaluation               End of Session Equipment Utilized During Treatment: Gait belt Activity Tolerance: Patient tolerated treatment well;Patient limited by fatigue Patient left: in bed;with call bell/phone within reach;with family/visitor present (sitting EOB. Pt verbalizes he is not allowed to get up without assist. ) Nurse Communication: Mobility status         Time: [id number] PT Time Calculation (min) (ACUTE ONLY): 24 min   Charges:   PT Evaluation $PT Eval Moderate Complexity: 1 Procedure PT Treatments $Gait Training: 8-22 mins   PT G Codes:        [name] 07/11/2015, 4:38 PM  [name], [name], DPT [phone]

## 2015-07-11 NOTE — Progress Notes (Signed)
PROGRESS NOTE  [name] [id number] DOB: May 23, 1928 DOA: 07/10/2015 PCP: [name], MD  Assessment/Plan: Acute GI bleed - patient is on Apixaban which has been on hold after discussing with patient.  -s/p 2 units of packed red blood cell transfusion.  -Patient had EGD in November 2016 which showed angiodysplasia which was ablated.  -GI consult--- colonoscopy in AM  Decompensated diastolic CHF last EF measured was 55-60% in 2014 - patient is mildly short of breath and has lower extremity edema and chest x-ray shows congestion with elevated JVD.  -Lasix 20 mg IV 1 dose now and daily.  -Closely follow intake and output and daily weights -recheck echo now  Hypertension uncontrolled  -follow  Atrial fibrillation - rate controlled. Continue Coreg. Apixaban on hold due to GI bleed  Diabetes mellitus type 2 uncontrolled -SSI/lantus CBG  History of bioprosthetic aortic valve replacement and CABG.  Acute blood loss anemia - follow CBC.  Chronic kidney disease stage III creatinine is around her baseline. Follow metabolic panel  Code Status: full Family Communication: son (Dr. Acie Fredrickson) Disposition Plan:    Consultants:  GI  Procedures:      HPI/Subjective: No SOB, no CP  Objective: Filed Vitals:   07/11/15 0413 07/11/15 0805  BP: 130/49 151/58  Pulse: 86 88  Temp: 98.1 F (36.7 C) 97.4 F (36.3 C)  Resp: 20 20    Intake/Output Summary (Last 24 hours) at 07/11/15 0934 Last data filed at 07/11/15 0850  Gross per 24 hour  Intake   1245 ml  Output   1850 ml  Net   -605 ml   Filed Weights   07/11/15 0413  Weight: 107.276 kg (236 lb 8 oz)    Exam:   General:  Pleasant, hard of hearing  Cardiovascular: rrr  Respiratory: no wheezing  Abdomen: +BS, soft  Musculoskeletal: min edema   Data Reviewed: Basic Metabolic Panel:  Recent Labs Lab 07/10/15 1906 07/11/15 0513  NA 133* 137  K 5.3* 4.1  CL 99* 100*  CO2 24 27  GLUCOSE 345*  124*  BUN 43* 39*  CREATININE 2.11* 1.95*  CALCIUM 8.8* 9.0   Liver Function Tests:  Recent Labs Lab 07/10/15 1906  AST 23  ALT 17  ALKPHOS 107  BILITOT 0.8  PROT 6.5  ALBUMIN 3.4*   No results for input(s): LIPASE, AMYLASE in the last 168 hours. No results for input(s): AMMONIA in the last 168 hours. CBC:  Recent Labs Lab 07/10/15 1330 07/10/15 1906 07/11/15 0513  WBC 6.8 6.8 5.9  NEUTROABS 5.0  --   --   HGB 7.8 cL* 7.6* 9.1*  HCT 24.3 aL* 24.2* 28.9*  MCV 88.0 88.3 87.6  PLT 223.0 235 212   Cardiac Enzymes:  Recent Labs Lab 07/10/15 1940  TROPONINI 0.04*   BNP (last 3 results)  Recent Labs  04/13/15 0905  BNP 247.2*    ProBNP (last 3 results)  Recent Labs  07/10/15 1330  PROBNP 211.0*    CBG:  Recent Labs Lab 07/10/15 2223 07/10/15 2349 07/11/15 0411 07/11/15 0552 07/11/15 0804  GLUCAP 297* 281* 130* 89 89    No results found for this or any previous visit (from the past 240 hour(s)).   Studies: Dg Chest Port 1 View  07/10/2015  CLINICAL DATA:  Shortness of breath on exertion. Hypertension and GI bleeding EXAM: PORTABLE CHEST 1 VIEW COMPARISON:  04/14/2015 FINDINGS: Chronic cardiopericardial enlargement. The patient is status post median sternotomy for aortic valve replacement. Stable  aortic and hilar contours. Pulmonary venous congestion without edema or consolidation. No effusion or air leak IMPRESSION: Cardiomegaly and mild venous congestion. Electronically Signed   By: Monte Fantasia M.D.   On: 07/10/2015 23:24    Scheduled Meds: . amLODipine  5 mg Oral Daily  . carvedilol  6.25 mg Oral BID WC  . ferrous sulfate  325 mg Oral Q breakfast  . furosemide  20 mg Intravenous Daily  . insulin aspart  0-9 Units Subcutaneous Q4H  . insulin glargine  20 Units Subcutaneous Daily  . pantoprazole (PROTONIX) IV  40 mg Intravenous Q12H  . sodium chloride flush  3 mL Intravenous Q12H  . 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 If 7PM-7AM, please contact night-coverage at [email or web address], password [email or web address] 07/11/2015, 9:34 AM  LOS: 1 day

## 2015-07-11 NOTE — Telephone Encounter (Signed)
Clarify this was a called STAT lab, it was all they gave for results, the D-Dimer.
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
## 2015-07-11 NOTE — Progress Notes (Signed)
Pt A/Ox4, has been going to [facility] today without assistance. Pt refusing to have bed alarm on at this time. Pt instructed to call Rn to get out of bed.

## 2015-07-11 NOTE — Progress Notes (Signed)
Inpatient Diabetes Program Recommendations  AACE/ADA: New Consensus Statement on Inpatient Glycemic Control (2015)  Target Ranges:  Prepandial:   less than 140 mg/dL      Peak postprandial:   less than 180 mg/dL (1-2 hours)      Critically ill patients:  140 - 180 mg/dL   Review of Glycemic Control:  Results for [name], [name] (MRN [id number]) as of 07/11/2015 13:51  Ref. Range 07/11/2015 08:04 07/11/2015 12:30  Glucose-Capillary Latest Ref Range: 65-99 mg/dL 89 233 (H)    Diabetes history: Type 2 diabetes Outpatient Diabetes medications: Lantus 50 units daily, Novolog 4-12 units tid with meals Current orders for Inpatient glycemic control:  Lantus 20 units daily, Novolog moderate q 4 hours  Inpatient Diabetes Program Recommendations:    May consider changing Novolog correction tid with meals and add Novolog meal coverage 3 units tid with meals.  Thanks, Adah Perl, RN, BC-ADM Inpatient Diabetes Coordinator Pager [phone] (8a-5p)

## 2015-07-11 NOTE — Progress Notes (Signed)
Utilization review completed. Season Astacio, RN, BSN. 

## 2015-07-12 ENCOUNTER — Encounter (HOSPITAL_COMMUNITY): Payer: Self-pay | Admitting: *Deleted

## 2015-07-12 ENCOUNTER — Inpatient Hospital Stay (HOSPITAL_COMMUNITY): Payer: Medicare Other

## 2015-07-12 ENCOUNTER — Inpatient Hospital Stay (HOSPITAL_COMMUNITY): Payer: Medicare Other | Admitting: Anesthesiology

## 2015-07-12 ENCOUNTER — Encounter (HOSPITAL_COMMUNITY)
Admission: EM | Disposition: A | Discharge status: Home / Self Care | Payer: Self-pay | Source: Home / Self Care | Attending: Internal Medicine

## 2015-07-12 ENCOUNTER — Inpatient Hospital Stay: Payer: Medicare Other | Attending: Internal Medicine

## 2015-07-12 DIAGNOSIS — R0600 Dyspnea, unspecified: Secondary | ICD-10-CM

## 2015-07-12 HISTORY — PX: COLONOSCOPY: SHX5424

## 2015-07-12 LAB — TYPE AND SCREEN
ABO/RH(D): O POS
ANTIBODY SCREEN: NEGATIVE
Unit division: 0
Unit division: 0

## 2015-07-12 LAB — GLUCOSE, CAPILLARY
GLUCOSE-CAPILLARY: 74 mg/dL (ref 65–99)
GLUCOSE-CAPILLARY: 217 mg/dL — AB (ref 65–99)
GLUCOSE-CAPILLARY: 115 mg/dL — AB (ref 65–99)
GLUCOSE-CAPILLARY: 193 mg/dL — AB (ref 65–99)

## 2015-07-12 LAB — BASIC METABOLIC PANEL
ANION GAP: 9 (ref 5–15)
BUN: 37 mg/dL — ABNORMAL HIGH (ref 6–20)
CALCIUM: 9 mg/dL (ref 8.9–10.3)
CO2: 24 mmol/L (ref 22–32)
Chloride: 107 mmol/L (ref 101–111)
Creatinine, Ser: 2.01 mg/dL — ABNORMAL HIGH (ref 0.61–1.24)
GFR calc non Af Amer: 28 mL/min — ABNORMAL LOW (ref >60)
GFR, EST AFRICAN AMERICAN: 33 mL/min — AB (ref >60)
Glucose, Bld: 45 mg/dL — ABNORMAL LOW (ref 65–99)
POTASSIUM: 3.9 mmol/L (ref 3.5–5.1)
Sodium: 140 mmol/L (ref 135–145)

## 2015-07-12 LAB — CBC
HEMATOCRIT: 27.9 % — AB (ref 39.0–52.0)
HEMOGLOBIN: 9.1 g/dL — AB (ref 13.0–17.0)
MCH: 28.8 pg (ref 26.0–34.0)
MCHC: 32.6 g/dL (ref 30.0–36.0)
MCV: 88.3 fL (ref 78.0–100.0)
Platelets: 214 10*3/uL (ref 150–400)
RBC: 3.16 MIL/uL — AB (ref 4.22–5.81)
RDW: 15 % (ref 11.5–15.5)
WBC: 6.8 10*3/uL (ref 4.0–10.5)

## 2015-07-12 LAB — MAGNESIUM
Magnesium: 2.1 mg/dL (ref 1.7–2.4)
Magnesium: 1.9 mg/dL (ref 1.7–2.4)

## 2015-07-12 SURGERY — COLONOSCOPY
Anesthesia: Monitor Anesthesia Care

## 2015-07-12 MED ORDER — INSULIN GLARGINE 100 UNIT/ML ~~LOC~~ SOLN: SUBCUTANEOUS | Status: DC

## 2015-07-12 MED ORDER — DEXTROSE 5 % IV SOLN
INTRAVENOUS | Status: DC
  Administered 2015-07-12: 09:00:00 via INTRAVENOUS

## 2015-07-12 MED ORDER — SODIUM CHLORIDE 0.9 % IV SOLN: INTRAVENOUS | Status: DC

## 2015-07-12 MED ORDER — LACTATED RINGERS IV SOLN
INTRAVENOUS | Status: DC | PRN
  Administered 2015-07-12: 13:00:00 via INTRAVENOUS

## 2015-07-12 MED ORDER — PROPOFOL 10 MG/ML IV BOLUS
INTRAVENOUS | Status: DC | PRN
  Administered 2015-07-12 (×2): 10 mg via INTRAVENOUS

## 2015-07-12 MED ORDER — PROPOFOL 500 MG/50ML IV EMUL
INTRAVENOUS | Status: DC | PRN
  Administered 2015-07-12: 50 ug/kg/min via INTRAVENOUS

## 2015-07-12 MED ORDER — LIDOCAINE HCL (CARDIAC) 20 MG/ML IV SOLN
INTRAVENOUS | Status: DC | PRN
  Administered 2015-07-12: 50 mg via INTRAVENOUS

## 2015-07-12 NOTE — Transfer of Care (Signed)
Immediate Anesthesia Transfer of Care Note  Patient: [name]  Procedure(s) Performed: Procedure(s): COLONOSCOPY (N/A)  Patient Location: PACU and Endoscopy Unit  Anesthesia Type:MAC  Level of Consciousness: awake, alert  and oriented  Airway & Oxygen Therapy: Patient Spontanous Breathing and Patient connected to nasal cannula oxygen  Post-op Assessment: Report given to RN and Post -op Vital signs reviewed and stable  Post vital signs: Reviewed and stable  Last Vitals:  Filed Vitals:   07/12/15 1226 07/12/15 1352  BP:  124/48  Pulse:  71  Temp: 36.8 C   Resp: 20 25    Complications: No apparent anesthesia complications

## 2015-07-12 NOTE — Anesthesia Postprocedure Evaluation (Signed)
Anesthesia Post Note  Patient: [name]  Procedure(s) Performed: Procedure(s) (LRB): COLONOSCOPY (N/A)  Patient location during evaluation: Endoscopy Anesthesia Type: MAC Level of consciousness: awake Pain management: pain level controlled Vital Signs Assessment: post-procedure vital signs reviewed and stable Respiratory status: spontaneous breathing Cardiovascular status: stable Postop Assessment: no signs of nausea or vomiting Anesthetic complications: no    Last Vitals:  Filed Vitals:   07/12/15 1405 07/12/15 1410  BP: 145/58 150/51  Pulse: 82 89  Temp:    Resp: 22 20    Last Pain:  Filed Vitals:   07/12/15 1413  PainSc: 0-No pain                 Prisila Dlouhy

## 2015-07-12 NOTE — Anesthesia Preprocedure Evaluation (Signed)
Anesthesia Evaluation  Patient identified by MRN, date of birth, ID band Patient awake    Reviewed: Allergy & Precautions, NPO status , Patient's Chart, lab work & pertinent test results  History of Anesthesia Complications Negative for: history of anesthetic complications  Airway Mallampati: III  TM Distance: >3 FB Neck ROM: Full    Dental no notable dental hx.    Pulmonary neg shortness of breath, neg sleep apnea, neg COPD, neg recent URI, former smoker,    breath sounds clear to auscultation       Cardiovascular hypertension, (-) angina+ CAD and + CABG  (-) Past MI and (-) CHF (-) dysrhythmias  Rhythm:Irregular     Neuro/Psych  Neuromuscular disease    GI/Hepatic negative GI ROS, Neg liver ROS,   Endo/Other  diabetes, Type 2  Renal/GU CRFRenal disease     Musculoskeletal  (+) Arthritis ,   Abdominal   Peds  Hematology  (+) anemia ,   Anesthesia Other Findings   Reproductive/Obstetrics                             Anesthesia Physical Anesthesia Plan  ASA: III  Anesthesia Plan: MAC   Post-op Pain Management:    Induction: Intravenous  Airway Management Planned: Natural Airway, Nasal Cannula and Simple Face Mask  Additional Equipment: None  Intra-op Plan:   Post-operative Plan:   Informed Consent: I have reviewed the patients History and Physical, chart, labs and discussed the procedure including the risks, benefits and alternatives for the proposed anesthesia with the patient or authorized representative who has indicated his/her understanding and acceptance.   Dental advisory given  Plan Discussed with: CRNA and Surgeon  Anesthesia Plan Comments:         Anesthesia Quick Evaluation

## 2015-07-12 NOTE — Progress Notes (Signed)
  Echocardiogram 2D Echocardiogram Definity has been performed.  [name] M 07/12/2015, 12:16 PM

## 2015-07-12 NOTE — Progress Notes (Signed)
Orders received for pt discharge.  Discharge summary printed and reviewed with pt.  Explained medication regimen, and pt had no further questions at this time.  IV removed and site remains clean, dry, intact.  Telemetry removed.  Pt in stable condition and awaiting transport. 

## 2015-07-12 NOTE — Progress Notes (Signed)
Pt with 5 beat vtach this AM, K Schorr notified and ordered to add Mg level to AM labs

## 2015-07-12 NOTE — Interval H&P Note (Signed)
History and Physical Interval Note:  07/12/2015 12:45 PM  [name]  has presented today for surgery, with the diagnosis of anemia, FOBT +.  hx adenomatous colon polyps and colon avms.  The various methods of treatment have been discussed with the patient and family. After consideration of risks, benefits and other options for treatment, the patient has consented to  Procedure(s): COLONOSCOPY (N/A) as a surgical intervention .  The patient's history has been reviewed, patient examined, no change in status, stable for surgery.  I have reviewed the patient's chart and labs.  Questions were answered to the patient's satisfaction.     Milus Banister

## 2015-07-12 NOTE — Progress Notes (Signed)
PROGRESS NOTE  JENNY REESER [id number] DOB: Oct 30, 1927 DOA: 07/10/2015 PCP: [name], MD   Subjective: Very pleasant, he is Dr. Elmarie Shiley father. Has had low blood sugar this morning of 45, placed on 50 mL/hour of D5 W.  HPI: ANGELIA MORDHORST is a 82 y.o. male with history of CAD status post CABG, bioprosthetic aortic valve, chronic atrial fibrillation was admitted in November 2016 for GI bleed at that time EGD showed angiodysplasia which was ablated. Over the last few days patient has been getting increasingly short of breath on exertion. Lab work was done at patient's PCP office showed hemoglobin around 7 which was a drop from his regular hemoglobin of around 9-10. Patient has has not noticed any obvious bleeding. Stool for blood was positive. Chest x-ray shows congestion and occasional exam also has lower extremity edema. Patient has been admitted for GI bleed and decompensated CHF. Patient is on Eliquis for atrial fibrillation.   Assessment/Plan:  Acute GI bleed  -Presented with a drop in his hemoglobin, occult bleeding no overt bleeding. -He is on Eliquis which has been on hold after discussing with patient.  -s/p 2 units of packed red blood cell transfusion. Hemoglobin improved to 9.1 after transfusion. -Patient had EGD in November 2016 which showed angiodysplasia which was ablated. -GI consult--- colonoscopy to be done  Acute blood loss anemia  -Acute blood loss anemia secondary to GI bleed. -Baseline hemoglobin is above 9, presented with hemoglobin of 7.6. -Status post transfusion of 2 units of packed RBCs, hemoglobin back to 9.1. Monitor closely check CBC in a.m.  Decompensated diastolic CHF last EF measured was 55-60% in 2014 -Patient is mildly short of breath and has lower extremity edema and chest x-ray shows congestion with elevated JVD.  -Lasix 20 mg IV 1 dose now and daily.  -Closely follow intake and output and daily weights -recheck echo now  Hypertension  uncontrolled  -follow  Atrial fibrillation  -Rate controlled. Continue Coreg. Apixaban on hold due to GI bleed. -This patients CHA2DS2-VASc Score and unadjusted Ischemic Stroke Rate (% per year) is equal to 9.7 % stroke rate/year from a score of 6 for HTN, DM and 2 points for each age above 63 and history of stroke. Above score calculated as 1 point each if present [CHF, HTN, DM, Vascular=MI/PAD/Aortic Plaque, Age if 65-74, or Male] Above score calculated as 2 points each if present [Age > 75, or Stroke/TIA/TE]  Diabetes mellitus type 2 uncontrolled -SSI/lantus CBG  History of bioprosthetic aortic valve replacement and CABG.  Chronic kidney disease stage III creatinine is around her baseline. Follow metabolic panel  Hypoglycemia -Developed blood sugar of 45 earlier today, started on D5W at 50 mL/hour because of being nothing by mouth. -Patient received about 35 units of Lantus last night, hold today.  Code Status: full Family Communication: son (Dr. Acie Fredrickson) Disposition Plan:    Consultants:  GI  Procedures:     Objective: Filed Vitals:   07/12/15 0611 07/12/15 0954  BP: 161/66 155/75  Pulse: 82 75  Temp: 97.6 F (36.4 C)   Resp: 18     Intake/Output Summary (Last 24 hours) at 07/12/15 1130 Last data filed at 07/12/15 1120  Gross per 24 hour  Intake   2630 ml  Output   2401 ml  Net    229 ml   Filed Weights   07/11/15 0413 07/12/15 0611  Weight: 107.276 kg (236 lb 8 oz) 104.418 kg (230 lb 3.2 oz)    Exam:  General:  Pleasant, hard of hearing  Cardiovascular: rrr  Respiratory: no wheezing  Abdomen: +BS, soft  Musculoskeletal: min edema   Data Reviewed: Basic Metabolic Panel:  Recent Labs Lab 07/10/15 1906 07/11/15 0513 07/12/15 0430  NA 133* 137 140  K 5.3* 4.1 3.9  CL 99* 100* 107  CO2 24 27 24   GLUCOSE 345* 124* 45*  BUN 43* 39* 37*  CREATININE 2.11* 1.95* 2.01*  CALCIUM 8.8* 9.0 9.0  MG  --   --  2.1   Liver Function  Tests:  Recent Labs Lab 07/10/15 1906  AST 23  ALT 17  ALKPHOS 107  BILITOT 0.8  PROT 6.5  ALBUMIN 3.4*   No results for input(s): LIPASE, AMYLASE in the last 168 hours. No results for input(s): AMMONIA in the last 168 hours. CBC:  Recent Labs Lab 07/10/15 1330 07/10/15 1906 07/11/15 0513 07/12/15 0430  WBC 6.8 6.8 5.9 6.8  NEUTROABS 5.0  --   --   --   HGB 7.8 cL* 7.6* 9.1* 9.1*  HCT 24.3 aL* 24.2* 28.9* 27.9*  MCV 88.0 88.3 87.6 88.3  PLT 223.0 235 212 214   Cardiac Enzymes:  Recent Labs Lab 07/10/15 1940  TROPONINI 0.04*   BNP (last 3 results)  Recent Labs  04/13/15 0905  BNP 247.2*    ProBNP (last 3 results)  Recent Labs  07/10/15 1330  PROBNP 211.0*    CBG:  Recent Labs Lab 07/11/15 0804 07/11/15 1230 07/11/15 1651 07/11/15 2133 07/12/15 0553  GLUCAP 89 233* 465* 269* 74    No results found for this or any previous visit (from the past 240 hour(s)).   Studies: Dg Chest Port 1 View  07/10/2015  CLINICAL DATA:  Shortness of breath on exertion. Hypertension and GI bleeding EXAM: PORTABLE CHEST 1 VIEW COMPARISON:  04/14/2015 FINDINGS: Chronic cardiopericardial enlargement. The patient is status post median sternotomy for aortic valve replacement. Stable aortic and hilar contours. Pulmonary venous congestion without edema or consolidation. No effusion or air leak IMPRESSION: Cardiomegaly and mild venous congestion. Electronically Signed   By: Monte Fantasia M.D.   On: 07/10/2015 23:24    Scheduled Meds: . amLODipine  5 mg Oral Daily  . carvedilol  6.25 mg Oral BID WC  . furosemide  20 mg Intravenous Daily  . insulin aspart  0-15 Units Subcutaneous TID WC  . insulin aspart  0-5 Units Subcutaneous QHS  . insulin aspart  3 Units Subcutaneous TID WC  . insulin glargine  20 Units Subcutaneous Daily  . pantoprazole  40 mg Oral Q0600  . sodium chloride flush  3 mL Intravenous Q12H  . tamsulosin  0.4 mg Oral QPC supper   Continuous  Infusions: . 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 If 7PM-7AM, please contact night-coverage at [email or web address], password [email or web address] 07/12/2015, 11:30 AM  LOS: 2 days

## 2015-07-12 NOTE — Progress Notes (Signed)
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     PT Comments    Patient seen for mobility progression and ambulation. Patient tolerated EOB therex well. Ambulated increased distance in hall but remains easily distracted requiring cues to direct to task. Some increased WOB upon exertion. Educated patient on pursed lip breathing and energy conservation. Patient receptive. Will continue to see and progress as tolerated.  Follow Up Recommendations  Outpatient PT;Supervision - Intermittent (pulmonary rehab)     Equipment Recommendations  None recommended by PT    Recommendations for Other Services OT consult     Precautions / Restrictions Precautions Precautions: Fall Restrictions Weight Bearing Restrictions: No    Mobility  Bed Mobility Overal bed mobility: Modified Independent             General bed mobility comments: increased time to perform, use of bed rail  Transfers Overall transfer level: Needs assistance Equipment used: Rolling walker (2 wheeled) Transfers: Sit to/from Stand Sit to Stand: Min guard         General transfer comment: VCs for positioning at EOb to power up to standing. increased effort to come to standing from low surface.  Ambulation/Gait Ambulation/Gait assistance: Min guard Ambulation Distance (Feet): 360 Feet Assistive device: Rolling walker (2 wheeled) Gait Pattern/deviations: Step-through pattern;Decreased stride length;Trunk flexed Gait velocity: decreased Gait velocity interpretation: Below normal speed for age/gender General Gait Details: Patient very slow with gait speed, cautious and deliberate. Easily distracted with max cues to direct to task. Increased fatigue with distance. HR  elevated to 120s with activity   Stairs            Wheelchair Mobility    Modified Rankin (Stroke Patients Only)       Balance   Sitting-balance support: Feet supported Sitting balance-[name]: Good     Standing balance support: During functional activity Standing balance-[name]: Poor Standing balance comment: continues to rely on UE support                    Cognition Arousal/Alertness: Awake/alert Behavior During Therapy: WFL for tasks assessed/performed Overall Cognitive Status: Within Functional Limits for tasks assessed                      Exercises      General Comments General comments (skin integrity, edema, etc.): performed functional tasks sitting EOB and in standing during session.       Pertinent Vitals/Pain Pain Assessment: No/denies pain    Home Living                      Prior Function            PT Goals (current goals can now be found in the care plan section) Acute Rehab PT Goals Patient Stated Goal: to return to exercise class and to go home PT Goal Formulation: With patient Time For Goal Achievement: 07/25/15 Potential to Achieve Goals: Good Progress towards PT goals: Progressing toward goals    Frequency  Min 3X/week    PT Plan Current plan remains appropriate    Co-evaluation             End of Session Equipment Utilized During Treatment: Gait  belt Activity Tolerance: Patient tolerated treatment well;Patient limited by fatigue Patient left: in chair;with call bell/phone within reach (refused chair alarm)     Time: [id number] PT Time Calculation (min) (ACUTE ONLY): 26 min  Charges:  $Gait Training: 8-22 mins $Therapeutic Activity: 8-22 mins                    G CodesDuncan Dull Aug 08, 2015, 8:50 AM Alben Deeds, PT DPT  [phone]

## 2015-07-12 NOTE — Op Note (Signed)
[facility] [place], [address]   COLONOSCOPY PROCEDURE REPORT  PATIENT: [name], [name]  MR#: [id number] BIRTHDATE: June 05, 1928 , 29  yrs. old GENDER: male ENDOSCOPIST: Milus Banister, MD PROCEDURE DATE:  07/12/2015 PROCEDURE:   Colonoscopy, diagnostic First Screening Colonoscopy - Avg.  risk and is 50 yrs.  old or older - No.  Prior Negative Screening - Now for repeat screening. N/A  History of Adenoma - Now for follow-up colonoscopy & has been > or = to 3 yrs.  N/A  Recommend repeat exam, <10 yrs? No ASA CLASS:   Class III INDICATIONS:recurrent heme + anemia, known small bowel AVMs, remove colonsocopy (per patient). MEDICATIONS: Monitored anesthesia care  DESCRIPTION OF PROCEDURE:   After the risks benefits and alternatives of the procedure were thoroughly explained, informed consent was obtained.  The digital rectal exam revealed no abnormalities of the rectum.   The [employer]ax Adult Colon [id number] endoscope was introduced through the anus and advanced to the cecum, which was identified by both the appendix and ileocecal valve. No adverse events experienced.   The quality of the prep was fair.  The instrument was then slowly withdrawn as the colon was fully examined. Estimated blood loss is zero unless otherwise noted in this procedure report.   COLON FINDINGS: There were a few small diverticulum in the left colon.  The examination was otherwise normal.  Retroflexed views revealed no abnormalities. The time to cecum = 4 min Withdrawal time = 10 min   The scope was withdrawn and the procedure completed. COMPLICATIONS: There were no immediate complications.  ENDOSCOPIC IMPRESSION: There were a few small diverticulum in the left colon. The examination was otherwise normal. No polyps or cancers.  RECOMMENDATIONS: He has not been having overt GI bleeding and so it is safe for discharge home.  [facility] will contact him about  capsule endoscopy in the next few days.  I spoke with his son, Dr. Acie Fredrickson, about this recommendation and he concurs.  eSigned:  Milus Banister, MD 07/12/2015 2:01 PM

## 2015-07-12 NOTE — Discharge Summary (Signed)
Physician Discharge Summary  [name] [id number] DOB: [birth year] DOA: 07/10/2015  PCP: [name], MD  Admit date: 07/10/2015 Discharge date: 07/12/2015  Time spent: 40 minutes  Recommendations for Outpatient Follow-up:  1. Follow-up with primary care physician within one week. 2. Follow-up with Dr. Ardis Hughs for capsule endoscopy as outpatient. 3. 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)   Discharge Condition: Stable  Diet recommendation: Heart healthy  Filed Weights   07/11/15 0413 07/12/15 0611  Weight: 107.276 kg (236 lb 8 oz) 104.418 kg (230 lb 3.2 oz)    History of present illness:  [name] is a very pleasant 82 y.o. male with history of CAD status post CABG, bioprosthetic aortic valve, chronic atrial fibrillation was admitted in November 2016 for GI bleed at that time EGD showed angiodysplasia which was ablated. Over the last few days patient has been getting increasingly short of breath on exertion. Lab work was done at patient's PCP office showed hemoglobin around 7 which was a drop from his regular hemoglobin of around 9-10. Patient has has not noticed any obvious bleeding. Stool for blood was positive. Chest x-ray shows congestion and occasional exam also has lower extremity edema. Patient has been admitted for GI bleed and decompensated CHF. Patient is on Eliquis for atrial fibrillation.   Hospital Course:    Acute GI bleed  -Presented with a drop in his hemoglobin, occult bleeding with no evidence of overt bleeding. -He is on Eliquis which has been on hold after discussing with patient.  -s/p 2 units of packed red blood cell transfusion. Hemoglobin improved to 9.1 after transfusion. -Patient had EGD in November 2016 which  showed angiodysplasia which was ablated. -Colonoscopy done earlier today showed no evidence of active bleeding. -I have discussed with his son Dr. Acie Fredrickson, agreed with the discharge, hold Eliquis and start on Friday.  Acute blood loss anemia  -Acute blood loss anemia secondary to GI bleed. -Baseline hemoglobin is above 9, presented with hemoglobin of 7.6. -Status post transfusion of 2 units of packed RBCs, hemoglobin back to 9.1.   Decompensated diastolic CHF last EF measured was 55-60% in 2014 -Patient is mildly short of breath and has lower extremity edema and chest x-ray shows congestion with elevated JVD.  -Lasix 20 mg IV 1 dose now and daily.  -2-D echo showed LVEF of 60-65%.  Hypertension uncontrolled  -follow  Atrial fibrillation  -Rate controlled. Continue Coreg. Discussed with his son who is a cardiologist, agree to hold Eliquis for 1 more day. -This patients CHA2DS2-VASc Score and unadjusted Ischemic Stroke Rate (% per year) is equal to 9.7 % stroke rate/year from a score of 6 for HTN, DM and 2 points for each age above 67 and history of stroke. Above score calculated as 1 point each if present [CHF, HTN, DM, Vascular=MI/PAD/Aortic Plaque, Age if 65-74, or Male] Above score calculated as 2 points each if present [Age > 75, or Stroke/TIA/TE]  Diabetes mellitus type 2 uncontrolled -SSI/lantus CBG  History of bioprosthetic aortic valve replacement and CABG. -The bioprosthetic valve looks okay on the 2-D echo.  Chronic kidney disease stage III creatinine is around her baseline. Follow metabolic panel  Hypoglycemia -Developed blood sugar of 45 earlier today, received D5W while  he was nothing by mouth for the colonoscopy. -I have asked him to decrease the dose of Lantus insulin to 45 units every day.   Procedures:  Colonoscopy done by Dr. Ardis Hughs on 07/12/2015 ENDOSCOPIC IMPRESSION: There were a few small diverticulum in the left colon. The examination was otherwise  normal. No polyps or cancers.  RECOMMENDATIONS: He has not been having overt GI bleeding and so it is safe for discharge home. [facility] will contact him about capsule endoscopy in the next few days. I spoke with his son, Dr. Acie Fredrickson, about this recommendation and he concurs.   2-D echo read by Dr. Arlyce Dice on 07/12/2015 Study Conclusions  - Left ventricle: The cavity size was normal. There was moderate concentric hypertrophy. Systolic function was normal. The estimated ejection fraction was in the range of 60% to 65%. Wall motion was normal; there were no regional wall motion abnormalities. Doppler parameters are consistent with high ventricular filling pressure. - Aortic valve: A bioprosthesis was present and functioning normally. Peak velocity (S): 275 cm/s. - Mitral valve: Moderately calcified annulus. - Left atrium: The atrium was moderately dilated. - Right ventricle: The cavity size was moderately dilated. Wall thickness was normal. - Right atrium: The atrium was mildly dilated.  Impressions:  - Compared to the prior study, there has been no significant interval change.  Consultations:  Gastroenterology  Discharge Exam: Filed Vitals:   07/12/15 1410 07/12/15 1445  BP: 150/51 173/57  Pulse: 89   Temp:    Resp: 20    General: Alert and awake, oriented x3, not in any acute distress. HEENT: anicteric sclera, pupils reactive to light and accommodation, EOMI CVS: S1-S2 clear, no murmur rubs or gallops Chest: clear to auscultation bilaterally, no wheezing, rales or rhonchi Abdomen: soft nontender, nondistended, normal bowel sounds, no organomegaly Extremities: no cyanosis, clubbing or edema noted bilaterally Neuro: Cranial nerves II-XII intact, no focal neurological deficits  Discharge Instructions   Discharge Instructions    Diet - low sodium heart healthy    Complete by:  As directed      Increase activity slowly    Complete by:  As directed            Current Discharge Medication List    CONTINUE these medications which have CHANGED   Details  insulin glargine (LANTUS) 100 UNIT/ML injection Inject 45 units into the skin daily in the morning Qty: 10 mL, Refills: 6      CONTINUE these medications which have NOT CHANGED   Details  amLODipine (NORVASC) 5 MG tablet Take 1 tablet (5 mg total) by mouth daily. Qty: 30 tablet, Refills: 2    apixaban (ELIQUIS) 2.5 MG TABS tablet Take 1 tablet (2.5 mg total) by mouth 2 (two) times daily. Start on 04/20/15    Ascorbic Acid (VITAMIN C PO) Take 500 mg by mouth daily.     carvedilol (COREG) 6.25 MG tablet Take 6.25 mg by mouth 2 (two) times daily.    Cholecalciferol (VITAMIN D PO) Take 1 tablet by mouth 3 (three) times a week.    Cholecalciferol (VITAMIN D3) 1000 UNITS CAPS Take 1 capsule by mouth daily.    docusate sodium (COLACE) 100 MG capsule Take 100 mg by mouth daily.    ferrous sulfate 325 (65 FE) MG tablet Take 325 mg by mouth daily with breakfast.    furosemide (LASIX) 40 MG tablet TAKE ONE-HALF TABLET DAILY Qty: 30 tablet, Refills: 5    hydroxychloroquine (PLAQUENIL) 200 MG tablet Take 200 mg  by mouth daily.    insulin aspart (NOVOLOG) 100 UNIT/ML injection Inject 4-12 Units into the skin 3 (three) times daily with meals.    Multiple Vitamin (MULTI-VITAMIN PO) Take 1 tablet by mouth daily.     Multiple Vitamins-Minerals (PRESERVISION AREDS PO) Take 1 capsule by mouth 2 (two) times daily.     pantoprazole (PROTONIX) 40 MG tablet TAKE 1 TABLET EVERY DAY Qty: 30 tablet, Refills: 11    tamsulosin (FLOMAX) 0.4 MG CAPS capsule TAKE ONE CAPSULE DAILY AFTER SUPPER Qty: 30 capsule, Refills: 3    isosorbide mononitrate (IMDUR) 30 MG 24 hr tablet Take 1 tablet (30 mg total) by mouth daily. Qty: 30 tablet, Refills: 0    polyethylene glycol powder (GLYCOLAX/MIRALAX) powder Take 255 g by mouth daily. Qty: 255 g, Refills: 3       Allergies  Allergen Reactions  .  Asa [Aspirin]   . Metoprolol     bradycardia  . Penicillins Swelling    Has patient had a PCN reaction causing immediate rash, facial/tongue/throat swelling, SOB or lightheadedness with hypotension: No Has patient had a PCN reaction causing severe rash involving mucus membranes or skin necrosis: No Has patient had a PCN reaction that required hospitalization No Has patient had a PCN reaction occurring within the last 10 years: No If all of the above answers are "NO", then may proceed with Cephalosporin use.       The results of significant diagnostics from this hospitalization (including imaging, microbiology, ancillary and laboratory) are listed below for reference.    Significant Diagnostic Studies: Dg Chest Port 1 View  07/10/2015  CLINICAL DATA:  Shortness of breath on exertion. Hypertension and GI bleeding EXAM: PORTABLE CHEST 1 VIEW COMPARISON:  04/14/2015 FINDINGS: Chronic cardiopericardial enlargement. The patient is status post median sternotomy for aortic valve replacement. Stable aortic and hilar contours. Pulmonary venous congestion without edema or consolidation. No effusion or air leak IMPRESSION: Cardiomegaly and mild venous congestion. Electronically Signed   By: Monte Fantasia M.D.   On: 07/10/2015 23:24    Microbiology: No results found for this or any previous visit (from the past 240 hour(s)).   Labs: Basic Metabolic Panel:  Recent Labs Lab 07/10/15 1906 07/11/15 0513 07/12/15 0430 07/12/15 1647  NA 133* 137 140  --   K 5.3* 4.1 3.9  --   CL 99* 100* 107  --   CO2 24 27 24   --   GLUCOSE 345* 124* 45*  --   BUN 43* 39* 37*  --   CREATININE 2.11* 1.95* 2.01*  --   CALCIUM 8.8* 9.0 9.0  --   MG  --   --  2.1 1.9   Liver Function Tests:  Recent Labs Lab 07/10/15 1906  AST 23  ALT 17  ALKPHOS 107  BILITOT 0.8  PROT 6.5  ALBUMIN 3.4*   No results for input(s): LIPASE, AMYLASE in the last 168 hours. No results for input(s): AMMONIA in the last 168  hours. CBC:  Recent Labs Lab 07/10/15 1330 07/10/15 1906 07/11/15 0513 07/12/15 0430  WBC 6.8 6.8 5.9 6.8  NEUTROABS 5.0  --   --   --   HGB 7.8 cL* 7.6* 9.1* 9.1*  HCT 24.3 aL* 24.2* 28.9* 27.9*  MCV 88.0 88.3 87.6 88.3  PLT 223.0 235 212 214   Cardiac Enzymes:  Recent Labs Lab 07/10/15 1940  TROPONINI 0.04*   BNP: BNP (last 3 results)  Recent Labs  04/13/15 0905  BNP 247.2*  ProBNP (last 3 results)  Recent Labs  07/10/15 1330  PROBNP 211.0*    CBG:  Recent Labs Lab 07/11/15 2133 07/12/15 0553 07/12/15 1139 07/12/15 1442 07/12/15 1628  GLUCAP 269* 74 217* 115* 193*       Signed:  Norrine Ballester A MD.  [facility] 07/12/2015, 7:04 PM

## 2015-07-12 NOTE — Anesthesia Procedure Notes (Signed)
Date/Time: 07/12/2015 1:15 PM Performed by: Eligha Bridegroom Pre-anesthesia Checklist: Patient identified, Timeout performed, Emergency Drugs available, Suction available and Patient being monitored Patient Re-evaluated:Patient Re-evaluated prior to inductionOxygen Delivery Method: Nasal cannula Intubation Type: IV induction

## 2015-07-12 NOTE — Progress Notes (Signed)
Educated pt on Fall Prevention Plan.  Pt continues to refuse bed/chair alarm.  Pt advised to call for assistance when attempting to get out of bed.  Pt non-compliant.
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
## 2015-07-13 ENCOUNTER — Telehealth: Payer: Self-pay | Admitting: *Deleted

## 2015-07-13 ENCOUNTER — Other Ambulatory Visit: Payer: Self-pay | Admitting: Physical Medicine and Rehabilitation

## 2015-07-13 ENCOUNTER — Other Ambulatory Visit: Payer: Self-pay | Admitting: Internal Medicine

## 2015-07-13 ENCOUNTER — Encounter (HOSPITAL_COMMUNITY): Payer: Self-pay | Admitting: Gastroenterology

## 2015-07-13 ENCOUNTER — Telehealth: Payer: Self-pay

## 2015-07-13 DIAGNOSIS — R195 Other fecal abnormalities: Secondary | ICD-10-CM

## 2015-07-13 NOTE — Telephone Encounter (Signed)
Had lab appt 2/1 he did not come for because he was in [facility] hospital and had his lab checked while there.  CBC  Status: Finalresult Visible to patient:  Not Released Nextappt: 07/18/2015 at 02:00 PM in Gastroenterology ([facility] TEST[facility])             Ref Range 1d ago    WBC 4.0 - 10.5 K/uL 6.8   RBC 4.22 - 5.81 MIL/uL 3.16 (L)   Hemoglobin 13.0 - 17.0 g/dL 9.1 (L)   HCT 39.0 - 52.0 % 27.9 (L)   MCV 78.0 - 100.0 fL 88.3   MCH 26.0 - 34.0 pg 28.8   MCHC 30.0 - 36.0 g/dL 32.6   RDW 11.5 - 15.5 % 15.0   Platelets 150 - 400 K/uL 214   Resulting Agency [facility]

## 2015-07-13 NOTE — Telephone Encounter (Signed)
Pt has been scheduled for capsule teaching and capsule endo. Pt has been notified.

## 2015-07-13 NOTE — Telephone Encounter (Signed)
-----   Message from Milus Banister, MD sent at 07/12/2015  2:05 PM EST ----- He needs outpatient capsule endoscopy for heme + stool.  Should be going home today from [facility]. Next week is fine. tahnks

## 2015-07-14 ENCOUNTER — Telehealth: Payer: Self-pay

## 2015-07-14 NOTE — Telephone Encounter (Signed)
Transition Care Management Follow-up Telephone Call   Date discharged? 07/12/15   How have you been since you were released from the hospital?  Overall, doing well.  No pain.  No signs of bleeding.    Do you understand why you were in the hospital? Yes, GI bleed and CHF.   Do you understand the discharge instructions? Yes, and I am moving slowly between activities.   Where were you discharged to? Home   Items Reviewed:  Medications reviewed: Yes, taking all scheduled medication as prescribed and without issues. Low dose Eliquis to start today.  Allergies reviewed: Yes, no changes.  Dietary changes reviewed: Yes, low sodium, heart healthy diet, no problems.  Referrals reviewed: Yes, GI and Cardiology appointments made.   Functional Questionnaire:  Activities of Daily Living (ADLs):   He states they are independent in the following: Toileting, self feeding, grooming. States they require assistance with the following: Ambulating (uses walker), meal prep, bathing, dressing, home health assists.  Any transportation issues/concerns?: No.   Any patient concerns? None at this time.   Confirmed importance and date/time of follow-up visits scheduled?  Yes, appointment scheduled 07/17/15 at 4:30p.  Provider Appointment booked with Dr. Derrel Nip (PCP).  Confirmed with patient if condition begins to worsen call PCP or go to the ER.  Patient was given the office number and encouraged to call back with question or concerns.  : Yes, patient verbalized understanding.

## 2015-07-17 ENCOUNTER — Encounter: Payer: Self-pay | Admitting: Internal Medicine

## 2015-07-17 ENCOUNTER — Ambulatory Visit (INDEPENDENT_AMBULATORY_CARE_PROVIDER_SITE_OTHER): Payer: Medicare Other | Admitting: Internal Medicine

## 2015-07-17 VITALS — BP 160/64 | HR 65 | Temp 97.6°F | Resp 14 | Ht 70.0 in | Wt 242.8 lb

## 2015-07-17 DIAGNOSIS — F419 Anxiety disorder, unspecified: Secondary | ICD-10-CM

## 2015-07-17 DIAGNOSIS — Z7901 Long term (current) use of anticoagulants: Secondary | ICD-10-CM | POA: Diagnosis not present

## 2015-07-17 DIAGNOSIS — E1142 Type 2 diabetes mellitus with diabetic polyneuropathy: Secondary | ICD-10-CM | POA: Diagnosis not present

## 2015-07-17 DIAGNOSIS — D62 Acute posthemorrhagic anemia: Secondary | ICD-10-CM

## 2015-07-17 DIAGNOSIS — K922 Gastrointestinal hemorrhage, unspecified: Secondary | ICD-10-CM

## 2015-07-17 DIAGNOSIS — F5105 Insomnia due to other mental disorder: Secondary | ICD-10-CM

## 2015-07-17 MED ORDER — ESCITALOPRAM OXALATE 10 MG PO TABS: 10.0000 mg | ORAL_TABLET | Freq: Every day | ORAL | Status: DC

## 2015-07-17 NOTE — Progress Notes (Signed)
Subjective:  Patient ID: [name], male    DOB: 28-Jun-1927  Age: 82 y.o. MRN: [id number]  CC: The primary encounter diagnosis was Acute blood loss anemia. Diagnoses of Insomnia secondary to anxiety, Long-term (current) use of anticoagulants, Diabetic polyneuropathy associated with type 2 diabetes mellitus ([place]), and Acute GI bleeding were also pertinent to this visit.  HPI [name] presents for hospital follow up.  [name] was sent to the ER after presenting with signs and symptoms of decompensated heart failure secondary to an acute drop in hemoglobin to 7.8 and heme posiitve stool. He was admitted to [facility] on Jan 30th after hgb drop was confirmed by ER physician.  He was transfused 2 units of PRBC f.  Colonoscopy failed to reveal any source of GI bleed. EGD done on prior admission Nov 2016 for GI bleed showed angiodysplasia which was ablated. He was discharged on Feb 1 with no change in hgb overnight and instructions to suspend Eliquis until Feb 3.  He has been relatively asymptomatic since discharge . Resumed Eliquis last Friday.   He has been having recurrent hypoglycemic events and his lantus dose was reduced to 45 units prior to discharge.Today in the office he became symptomatic 3 hours after eating lunch.   Trouble sleeping at night,  Lies awake for 4 hours some nights. He notes increased anxiety over the last several months .  No prior trial of SSRI.  Lives alone.      Outpatient Prescriptions Prior to Visit  Medication Sig Dispense Refill  . amLODipine (NORVASC) 5 MG tablet TAKE 1 TABLET EVERY DAY 30 tablet 5  . apixaban (ELIQUIS) 2.5 MG TABS tablet Take 1 tablet (2.5 mg total) by mouth 2 (two) times daily. Start on 04/20/15    . Ascorbic Acid (VITAMIN C PO) Take 500 mg by mouth daily.     . carvedilol (COREG) 6.25 MG tablet Take 6.25 mg by mouth 2 (two) times daily.    . Cholecalciferol (VITAMIN D PO) Take 1 tablet by mouth 3 (three) times a week.    .  Cholecalciferol (VITAMIN D3) 1000 UNITS CAPS Take 1 capsule by mouth daily.    [name] docusate sodium (COLACE) 100 MG capsule Take 100 mg by mouth daily.    . ferrous sulfate 325 (65 FE) MG tablet Take 325 mg by mouth daily with breakfast.    . furosemide (LASIX) 40 MG tablet TAKE ONE-HALF TABLET DAILY 30 tablet 5  . hydroxychloroquine (PLAQUENIL) 200 MG tablet Take 200 mg by mouth daily.    . insulin aspart (NOVOLOG) 100 UNIT/ML injection Inject 4-12 Units into the skin 3 (three) times daily with meals.    . insulin glargine (LANTUS) 100 UNIT/ML injection Inject 45 units into the skin daily in the morning 10 mL 6  . isosorbide mononitrate (IMDUR) 30 MG 24 hr tablet Take 1 tablet (30 mg total) by mouth daily. 30 tablet 0  . Multiple Vitamin (MULTI-VITAMIN PO) Take 1 tablet by mouth daily.     . Multiple Vitamins-Minerals (PRESERVISION AREDS PO) Take 1 capsule by mouth 2 (two) times daily.     . pantoprazole (PROTONIX) 40 MG tablet TAKE 1 TABLET EVERY DAY 30 tablet 11  . polyethylene glycol powder (GLYCOLAX/MIRALAX) powder Take 255 g by mouth daily. 255 g 3  . tamsulosin (FLOMAX) 0.4 MG CAPS capsule TAKE ONE CAPSULE DAILY AFTER SUPPER 30 capsule 3   No facility-administered medications prior to visit.    Review of Systems;  Patient denies headache, fevers, malaise, unintentional weight loss, skin rash, eye pain, sinus congestion and sinus pain, sore throat, dysphagia,  hemoptysis , cough, dyspnea, wheezing, chest pain, palpitations, orthopnea, edema, abdominal pain, nausea, melena, diarrhea, constipation, flank pain, dysuria, hematuria, urinary  Frequency, nocturia, numbness, tingling, seizures,  Focal weakness, Loss of consciousness,  Tremor, insomnia, depression, anxiety, and suicidal ideation.      Objective:  BP 160/64 mmHg  Pulse 65  Temp(Src) 97.6 F (36.4 C) (Oral)  Resp 14  Ht 5\' 10"  (1.778 m)  Wt 242 lb 12 oz (110.111 kg)  BMI 34.83 kg/m2  SpO2 98%  BP Readings from Last 3  Encounters:  07/17/15 160/64  07/12/15 173/57  07/10/15 152/66    Wt Readings from Last 3 Encounters:  07/17/15 242 lb 12 oz (110.111 kg)  07/12/15 230 lb 3.2 oz (104.418 kg)  07/10/15 246 lb 9.6 oz (111.857 kg)    General appearance: alert, cooperative and appears stated age Ears: normal TM's and external ear canals both ears Throat: lips, mucosa, and tongue normal; teeth and gums normal Neck: no adenopathy, no carotid bruit, supple, symmetrical, trachea midline and thyroid not enlarged, symmetric, no tenderness/mass/nodules Back: symmetric, no curvature. ROM normal. No CVA tenderness. Lungs: clear to auscultation bilaterally Heart: regular rate and rhythm, S1, S2 normal, no murmur, click, rub or gallop Abdomen: soft, non-tender; bowel sounds normal; no masses,  no organomegaly Pulses: 2+ and symmetric Skin: Skin color, texture, turgor normal. No rashes or lesions Lymph nodes: Cervical, supraclavicular, and axillary nodes normal.  Lab Results  Component Value Date   HGBA1C 6.9* 06/22/2015   HGBA1C 7.1* 03/06/2015   HGBA1C 6.5 01/18/2015    Lab Results  Component Value Date   CREATININE 2.01* 07/12/2015   CREATININE 1.95* 07/11/2015   CREATININE 2.11* 07/10/2015    Lab Results  Component Value Date   WBC 7.0 07/17/2015   HGB 9.4* 07/17/2015   HCT 28.7* 07/17/2015   PLT 256.0 07/17/2015   GLUCOSE 45* 07/12/2015   CHOL 129 01/18/2015   TRIG 58.0 01/18/2015   HDL 45.00 01/18/2015   LDLDIRECT 56.0 06/22/2015   LDLCALC 72 01/18/2015   ALT 17 07/10/2015   AST 23 07/10/2015   NA 140 07/12/2015   K 3.9 07/12/2015   CL 107 07/12/2015   CREATININE 2.01* 07/12/2015   BUN 37* 07/12/2015   CO2 24 07/12/2015   TSH 3.53 09/07/2014   PSA 0.48 09/07/2014   INR 1.20 04/12/2015   HGBA1C 6.9* 06/22/2015   MICROALBUR 70.5* 06/21/2015    Dg Chest Port 1 View  07/10/2015  CLINICAL DATA:  Shortness of breath on exertion. Hypertension and GI bleeding EXAM: PORTABLE CHEST 1  VIEW COMPARISON:  04/14/2015 FINDINGS: Chronic cardiopericardial enlargement. The patient is status post median sternotomy for aortic valve replacement. Stable aortic and hilar contours. Pulmonary venous congestion without edema or consolidation. No effusion or air leak IMPRESSION: Cardiomegaly and mild venous congestion. Electronically Signed   By: Monte Fantasia M.D.   On: 07/10/2015 23:24    Assessment & Plan:   Problem List Items Addressed This Visit    Diabetic polyneuropathy associated with type 2 diabetes mellitus ([place])    His recurrent  hypoglycemia has been addressed with the lowering of Lantus to 45 units.       Relevant Medications   escitalopram (LEXAPRO) 10 MG tablet   Long-term (current) use of anticoagulants    He has resumed Eliquis as of Feb 3rd due to increased risk of  embolic CVA .[name]       Acute blood loss anemia - Primary    S/p transfusion of 2 units PRBCs for Hgb 7.6 . Post transfusion hgb was 9.1 on Feb 1.  Lab Results  Component Value Date   WBC 7.0 07/17/2015   HGB 9.4* 07/17/2015   HCT 28.7* 07/17/2015   MCV 88.3 07/17/2015   PLT 256.0 07/17/2015         Relevant Orders   CBC with Differential/Platelet (Completed)   Acute GI bleeding    Source unclear given normal colonoscopy.  Capsule endoscopy to be done.       Insomnia secondary to anxiety    Trial of lexapro starting at 5 mg daily in th evening.        Relevant Medications   escitalopram (LEXAPRO) 10 MG tablet      I am having Kenneth. Kinker start on escitalopram. I am also having him maintain his Ascorbic Acid (VITAMIN C PO), Multiple Vitamin (MULTI-VITAMIN PO), Multiple Vitamins-Minerals (PRESERVISION AREDS PO), pantoprazole, Cholecalciferol (VITAMIN D PO), ferrous sulfate, apixaban, isosorbide mononitrate, polyethylene glycol powder, Vitamin D3, furosemide, tamsulosin, insulin aspart, carvedilol, docusate sodium, hydroxychloroquine, insulin glargine, and amLODipine.  Meds ordered this  encounter  Medications  . escitalopram (LEXAPRO) 10 MG tablet    Sig: Take 1 tablet (10 mg total) by mouth daily. With dinner    Dispense:  30 tablet    Refill:  4    There are no discontinued medications.  Follow-up: Return in about 3 months (around 10/14/2015).   [name], MD

## 2015-07-17 NOTE — Patient Instructions (Signed)
I am starting you on generic Lexapro to help you worry less at night   Take it every day with dinner.  Start with 1/2 tablet  For the first few days

## 2015-07-17 NOTE — Progress Notes (Signed)
Pre-visit discussion using our clinic review tool. No additional management support is needed unless otherwise documented below in the visit note.  

## 2015-07-18 ENCOUNTER — Telehealth: Payer: Self-pay | Admitting: *Deleted

## 2015-07-18 LAB — CBC WITH DIFFERENTIAL/PLATELET
BASOS ABS: 0.1 10*3/uL (ref 0.0–0.1)
Basophils Relative: 1.2 % (ref 0.0–3.0)
EOS ABS: 0.3 10*3/uL (ref 0.0–0.7)
Eosinophils Relative: 4.3 % (ref 0.0–5.0)
HCT: 28.7 % — ABNORMAL LOW (ref 39.0–52.0)
Hemoglobin: 9.4 g/dL — ABNORMAL LOW (ref 13.0–17.0)
LYMPHS ABS: 0.6 10*3/uL — AB (ref 0.7–4.0)
Lymphocytes Relative: 7.9 % — ABNORMAL LOW (ref 12.0–46.0)
MCHC: 32.7 g/dL (ref 30.0–36.0)
MCV: 88.3 fl (ref 78.0–100.0)
MONO ABS: 0.8 10*3/uL (ref 0.1–1.0)
MONOS PCT: 11.1 % (ref 3.0–12.0)
NEUTROS PCT: 75.5 % (ref 43.0–77.0)
Neutro Abs: 5.3 10*3/uL (ref 1.4–7.7)
Platelets: 256 10*3/uL (ref 150.0–400.0)
RBC: 3.25 Mil/uL — AB (ref 4.22–5.81)
RDW: 15.6 % — ABNORMAL HIGH (ref 11.5–15.5)
WBC: 7 10*3/uL (ref 4.0–10.5)

## 2015-07-18 NOTE — Telephone Encounter (Signed)
Called pt not able to leave a message

## 2015-07-18 NOTE — Telephone Encounter (Signed)
Patient requested his lab results from 07/17/15 Hemoglobin  Contact [phone]

## 2015-07-19 DIAGNOSIS — F5105 Insomnia due to other mental disorder: Secondary | ICD-10-CM

## 2015-07-19 DIAGNOSIS — F419 Anxiety disorder, unspecified: Secondary | ICD-10-CM | POA: Insufficient documentation

## 2015-07-19 NOTE — Assessment & Plan Note (Signed)
Source unclear given normal colonoscopy.  Capsule endoscopy to be done.

## 2015-07-19 NOTE — Assessment & Plan Note (Signed)
He has resumed Eliquis as of Feb 3rd due to increased risk of embolic CVA .[name]

## 2015-07-19 NOTE — Telephone Encounter (Signed)
Spoke with the patient.  See result note.

## 2015-07-19 NOTE — Assessment & Plan Note (Signed)
Trial of lexapro starting at 5 mg daily in th evening.

## 2015-07-19 NOTE — Telephone Encounter (Signed)
Pt called back to check on lab results.. please advise pt.[name]

## 2015-07-19 NOTE — Assessment & Plan Note (Signed)
S/p transfusion of 2 units PRBCs for Hgb 7.6 . Post transfusion hgb was 9.1 on Feb 1.  Lab Results  Component Value Date   WBC 7.0 07/17/2015   HGB 9.4* 07/17/2015   HCT 28.7* 07/17/2015   MCV 88.3 07/17/2015   PLT 256.0 07/17/2015

## 2015-07-19 NOTE — Assessment & Plan Note (Signed)
His recurrent  hypoglycemia has been addressed with the lowering of Lantus to 45 units.

## 2015-07-25 ENCOUNTER — Ambulatory Visit (INDEPENDENT_AMBULATORY_CARE_PROVIDER_SITE_OTHER): Payer: Medicare Other | Admitting: Gastroenterology

## 2015-07-25 DIAGNOSIS — D509 Iron deficiency anemia, unspecified: Secondary | ICD-10-CM | POA: Diagnosis not present

## 2015-07-25 NOTE — Progress Notes (Signed)
Patient here for capsule endoscopy.Patient has completed the prep and has not eaten. Tolerated procedure. Verbalizes understanding of written and verbal instructions.Capsule lot- [id number] expires- 2016-07-20.

## 2015-07-27 ENCOUNTER — Ambulatory Visit: Payer: Medicare Other | Admitting: Cardiovascular Disease

## 2015-07-28 ENCOUNTER — Inpatient Hospital Stay (HOSPITAL_COMMUNITY)
Admission: EM | Admit: 2015-07-28 | Discharge: 2015-07-31 | DRG: 391 | Disposition: A | Discharge status: Home / Self Care | Payer: Medicare Other | Attending: Internal Medicine | Admitting: Internal Medicine

## 2015-07-28 ENCOUNTER — Encounter (HOSPITAL_COMMUNITY): Payer: Self-pay | Admitting: Family Medicine

## 2015-07-28 ENCOUNTER — Encounter: Payer: Self-pay | Admitting: Gastroenterology

## 2015-07-28 ENCOUNTER — Telehealth: Payer: Self-pay | Admitting: Internal Medicine

## 2015-07-28 DIAGNOSIS — E1122 Type 2 diabetes mellitus with diabetic chronic kidney disease: Secondary | ICD-10-CM | POA: Diagnosis present

## 2015-07-28 DIAGNOSIS — Z7901 Long term (current) use of anticoagulants: Secondary | ICD-10-CM | POA: Diagnosis not present

## 2015-07-28 DIAGNOSIS — N189 Chronic kidney disease, unspecified: Secondary | ICD-10-CM | POA: Diagnosis not present

## 2015-07-28 DIAGNOSIS — F329 Major depressive disorder, single episode, unspecified: Secondary | ICD-10-CM | POA: Diagnosis present

## 2015-07-28 DIAGNOSIS — E1142 Type 2 diabetes mellitus with diabetic polyneuropathy: Secondary | ICD-10-CM | POA: Diagnosis present

## 2015-07-28 DIAGNOSIS — N400 Enlarged prostate without lower urinary tract symptoms: Secondary | ICD-10-CM | POA: Diagnosis present

## 2015-07-28 DIAGNOSIS — R001 Bradycardia, unspecified: Secondary | ICD-10-CM | POA: Diagnosis present

## 2015-07-28 DIAGNOSIS — D631 Anemia in chronic kidney disease: Secondary | ICD-10-CM | POA: Diagnosis present

## 2015-07-28 DIAGNOSIS — N183 Chronic kidney disease, stage 3 unspecified: Secondary | ICD-10-CM | POA: Diagnosis present

## 2015-07-28 DIAGNOSIS — K921 Melena: Secondary | ICD-10-CM | POA: Diagnosis present

## 2015-07-28 DIAGNOSIS — I441 Atrioventricular block, second degree: Secondary | ICD-10-CM | POA: Diagnosis present

## 2015-07-28 DIAGNOSIS — IMO0002 Reserved for concepts with insufficient information to code with codable children: Secondary | ICD-10-CM | POA: Diagnosis present

## 2015-07-28 DIAGNOSIS — D62 Acute posthemorrhagic anemia: Secondary | ICD-10-CM | POA: Diagnosis not present

## 2015-07-28 DIAGNOSIS — D649 Anemia, unspecified: Secondary | ICD-10-CM | POA: Diagnosis present

## 2015-07-28 DIAGNOSIS — K31819 Angiodysplasia of stomach and duodenum without bleeding: Principal | ICD-10-CM | POA: Diagnosis present

## 2015-07-28 DIAGNOSIS — I2510 Atherosclerotic heart disease of native coronary artery without angina pectoris: Secondary | ICD-10-CM | POA: Diagnosis present

## 2015-07-28 DIAGNOSIS — E785 Hyperlipidemia, unspecified: Secondary | ICD-10-CM | POA: Diagnosis present

## 2015-07-28 DIAGNOSIS — E1165 Type 2 diabetes mellitus with hyperglycemia: Secondary | ICD-10-CM | POA: Diagnosis present

## 2015-07-28 DIAGNOSIS — K922 Gastrointestinal hemorrhage, unspecified: Secondary | ICD-10-CM | POA: Diagnosis present

## 2015-07-28 DIAGNOSIS — I5032 Chronic diastolic (congestive) heart failure: Secondary | ICD-10-CM | POA: Diagnosis present

## 2015-07-28 DIAGNOSIS — I350 Nonrheumatic aortic (valve) stenosis: Secondary | ICD-10-CM | POA: Diagnosis present

## 2015-07-28 DIAGNOSIS — I482 Chronic atrial fibrillation, unspecified: Secondary | ICD-10-CM | POA: Diagnosis present

## 2015-07-28 DIAGNOSIS — I639 Cerebral infarction, unspecified: Secondary | ICD-10-CM | POA: Diagnosis present

## 2015-07-28 DIAGNOSIS — Q2733 Arteriovenous malformation of digestive system vessel: Secondary | ICD-10-CM | POA: Diagnosis not present

## 2015-07-28 DIAGNOSIS — Z952 Presence of prosthetic heart valve: Secondary | ICD-10-CM | POA: Diagnosis not present

## 2015-07-28 DIAGNOSIS — E1121 Type 2 diabetes mellitus with diabetic nephropathy: Secondary | ICD-10-CM | POA: Diagnosis present

## 2015-07-28 DIAGNOSIS — I129 Hypertensive chronic kidney disease with stage 1 through stage 4 chronic kidney disease, or unspecified chronic kidney disease: Secondary | ICD-10-CM | POA: Diagnosis present

## 2015-07-28 DIAGNOSIS — Z87891 Personal history of nicotine dependence: Secondary | ICD-10-CM

## 2015-07-28 DIAGNOSIS — Z953 Presence of xenogenic heart valve: Secondary | ICD-10-CM

## 2015-07-28 HISTORY — DX: Chronic diastolic (congestive) heart failure: I50.32

## 2015-07-28 HISTORY — DX: Permanent atrial fibrillation: I48.21

## 2015-07-28 LAB — COMPREHENSIVE METABOLIC PANEL
ALBUMIN: 3.3 g/dL — AB (ref 3.5–5.0)
ALT: 16 U/L — ABNORMAL LOW (ref 17–63)
ANION GAP: 10 (ref 5–15)
AST: 24 U/L (ref 15–41)
Alkaline Phosphatase: 82 U/L (ref 38–126)
BUN: 44 mg/dL — ABNORMAL HIGH (ref 6–20)
CALCIUM: 9.1 mg/dL (ref 8.9–10.3)
CHLORIDE: 104 mmol/L (ref 101–111)
CO2: 23 mmol/L (ref 22–32)
Creatinine, Ser: 2.08 mg/dL — ABNORMAL HIGH (ref 0.61–1.24)
GFR calc non Af Amer: 27 mL/min — ABNORMAL LOW (ref >60)
GFR, EST AFRICAN AMERICAN: 32 mL/min — AB (ref >60)
GLUCOSE: 121 mg/dL — AB (ref 65–99)
POTASSIUM: 4.6 mmol/L (ref 3.5–5.1)
SODIUM: 137 mmol/L (ref 135–145)
Total Bilirubin: 0.5 mg/dL (ref 0.3–1.2)
Total Protein: 6.3 g/dL — ABNORMAL LOW (ref 6.5–8.1)

## 2015-07-28 LAB — POC OCCULT BLOOD, ED: FECAL OCCULT BLD: POSITIVE — AB

## 2015-07-28 LAB — GLUCOSE, CAPILLARY
GLUCOSE-CAPILLARY: 128 mg/dL — AB (ref 65–99)
GLUCOSE-CAPILLARY: 96 mg/dL (ref 65–99)
Glucose-Capillary: 42 mg/dL — CL (ref 65–99)

## 2015-07-28 LAB — CBC
HEMATOCRIT: 25.1 % — AB (ref 39.0–52.0)
HEMATOCRIT: 26.7 % — AB (ref 39.0–52.0)
HEMOGLOBIN: 7.8 g/dL — AB (ref 13.0–17.0)
HEMOGLOBIN: 8.4 g/dL — AB (ref 13.0–17.0)
MCH: 27.5 pg (ref 26.0–34.0)
MCH: 27.7 pg (ref 26.0–34.0)
MCHC: 31.1 g/dL (ref 30.0–36.0)
MCHC: 31.5 g/dL (ref 30.0–36.0)
MCV: 88.4 fL (ref 78.0–100.0)
MCV: 88.1 fL (ref 78.0–100.0)
Platelets: 229 10*3/uL (ref 150–400)
Platelets: 200 10*3/uL (ref 150–400)
RBC: 2.84 MIL/uL — AB (ref 4.22–5.81)
RBC: 3.03 MIL/uL — AB (ref 4.22–5.81)
RDW: 14.3 % (ref 11.5–15.5)
RDW: 14.3 % (ref 11.5–15.5)
WBC: 6.2 10*3/uL (ref 4.0–10.5)
WBC: 6.1 10*3/uL (ref 4.0–10.5)

## 2015-07-28 LAB — PROTIME-INR
INR: 1.2 (ref 0.00–1.49)
PROTHROMBIN TIME: 15.4 s — AB (ref 11.6–15.2)

## 2015-07-28 LAB — PREPARE RBC (CROSSMATCH)

## 2015-07-28 LAB — APTT: aPTT: 30 seconds (ref 24–37)

## 2015-07-28 MED ORDER — FUROSEMIDE 20 MG PO TABS: 20.0000 mg | ORAL_TABLET | Freq: Every day | ORAL | Status: DC

## 2015-07-28 MED ORDER — VITAMIN D 1000 UNITS PO TABS
1000.0000 [IU] | ORAL_TABLET | Freq: Every day | ORAL | Status: DC
  Administered 2015-07-29 – 2015-07-31 (×3): 1000 [IU] via ORAL
  Filled 2015-07-28 (×3): qty 1

## 2015-07-28 MED ORDER — DEXTROSE 50 % IV SOLN
50.0000 mL | INTRAVENOUS | Status: DC | PRN
  Administered 2015-07-28 – 2015-07-29 (×2): 50 mL via INTRAVENOUS
  Filled 2015-07-28 (×2): qty 50

## 2015-07-28 MED ORDER — CALCITRIOL 0.25 MCG PO CAPS
0.2500 ug | ORAL_CAPSULE | ORAL | Status: DC
  Administered 2015-07-31: 0.25 ug via ORAL
  Filled 2015-07-28: qty 1

## 2015-07-28 MED ORDER — ADULT MULTIVITAMIN W/MINERALS CH
1.0000 | ORAL_TABLET | Freq: Every day | ORAL | Status: DC
  Administered 2015-07-29 – 2015-07-31 (×3): 1 via ORAL
  Filled 2015-07-28 (×3): qty 1

## 2015-07-28 MED ORDER — INSULIN ASPART 100 UNIT/ML ~~LOC~~ SOLN
0.0000 [IU] | Freq: Three times a day (TID) | SUBCUTANEOUS | Status: DC
  Administered 2015-07-29: 9 [IU] via SUBCUTANEOUS
  Administered 2015-07-29: 5 [IU] via SUBCUTANEOUS

## 2015-07-28 MED ORDER — ACETAMINOPHEN 325 MG PO TABS
650.0000 mg | ORAL_TABLET | Freq: Four times a day (QID) | ORAL | Status: DC | PRN
Start: 2015-07-28 — End: 2015-07-31

## 2015-07-28 MED ORDER — ESCITALOPRAM OXALATE 10 MG PO TABS
10.0000 mg | ORAL_TABLET | Freq: Every day | ORAL | Status: DC
  Administered 2015-07-29 – 2015-07-31 (×3): 10 mg via ORAL
  Filled 2015-07-28 (×3): qty 1

## 2015-07-28 MED ORDER — FERROUS SULFATE 325 (65 FE) MG PO TABS
325.0000 mg | ORAL_TABLET | Freq: Every day | ORAL | Status: DC
  Administered 2015-07-29 – 2015-07-31 (×3): 325 mg via ORAL
  Filled 2015-07-28 (×3): qty 1

## 2015-07-28 MED ORDER — ISOSORBIDE MONONITRATE ER 30 MG PO TB24
30.0000 mg | ORAL_TABLET | Freq: Every day | ORAL | Status: DC
  Administered 2015-07-29 – 2015-07-31 (×3): 30 mg via ORAL
  Filled 2015-07-28 (×3): qty 1

## 2015-07-28 MED ORDER — SODIUM CHLORIDE 0.9 % IV BOLUS (SEPSIS)
1000.0000 mL | Freq: Once | INTRAVENOUS | Status: AC
  Administered 2015-07-28: 1000 mL via INTRAVENOUS

## 2015-07-28 MED ORDER — DEXTROSE 50 % IV SOLN
INTRAVENOUS | Status: AC
  Administered 2015-07-28: 50 mL
  Filled 2015-07-28: qty 50

## 2015-07-28 MED ORDER — FUROSEMIDE 20 MG PO TABS
20.0000 mg | ORAL_TABLET | Freq: Every day | ORAL | Status: DC
  Administered 2015-07-29 – 2015-07-31 (×3): 20 mg via ORAL
  Filled 2015-07-28 (×3): qty 1

## 2015-07-28 MED ORDER — SODIUM CHLORIDE 0.9% FLUSH
3.0000 mL | Freq: Two times a day (BID) | INTRAVENOUS | Status: DC
  Administered 2015-07-28: 10 mL via INTRAVENOUS
  Administered 2015-07-29 – 2015-07-30 (×4): 3 mL via INTRAVENOUS

## 2015-07-28 MED ORDER — ESCITALOPRAM OXALATE 10 MG PO TABS: 10.0000 mg | ORAL_TABLET | Freq: Every day | ORAL | Status: DC

## 2015-07-28 MED ORDER — ONDANSETRON HCL 4 MG/2ML IJ SOLN: 4.0000 mg | Freq: Four times a day (QID) | INTRAMUSCULAR | Status: DC | PRN

## 2015-07-28 MED ORDER — VITAMIN C 500 MG PO TABS
500.0000 mg | ORAL_TABLET | Freq: Every day | ORAL | Status: DC
  Administered 2015-07-29 – 2015-07-31 (×3): 500 mg via ORAL
  Filled 2015-07-28 (×3): qty 1

## 2015-07-28 MED ORDER — AMLODIPINE BESYLATE 5 MG PO TABS: 5.0000 mg | ORAL_TABLET | Freq: Every day | ORAL | Status: DC

## 2015-07-28 MED ORDER — FUROSEMIDE 10 MG/ML IJ SOLN
20.0000 mg | Freq: Once | INTRAMUSCULAR | Status: AC
  Administered 2015-07-28: 20 mg via INTRAVENOUS
  Filled 2015-07-28: qty 2

## 2015-07-28 MED ORDER — ONDANSETRON HCL 4 MG PO TABS: 4.0000 mg | ORAL_TABLET | Freq: Four times a day (QID) | ORAL | Status: DC | PRN

## 2015-07-28 MED ORDER — ACETAMINOPHEN 650 MG RE SUPP: 650.0000 mg | Freq: Four times a day (QID) | RECTAL | Status: DC | PRN

## 2015-07-28 MED ORDER — INSULIN GLARGINE 100 UNIT/ML ~~LOC~~ SOLN: 25.0000 [IU] | Freq: Every day | SUBCUTANEOUS | Status: DC

## 2015-07-28 MED ORDER — SODIUM CHLORIDE 0.9 % IV SOLN
8.0000 mg/h | INTRAVENOUS | Status: DC
  Administered 2015-07-28 – 2015-07-30 (×4): 8 mg/h via INTRAVENOUS
  Filled 2015-07-28 (×10): qty 80

## 2015-07-28 MED ORDER — TAMSULOSIN HCL 0.4 MG PO CAPS
0.4000 mg | ORAL_CAPSULE | Freq: Every day | ORAL | Status: DC
  Administered 2015-07-29 – 2015-07-30 (×2): 0.4 mg via ORAL
  Filled 2015-07-28 (×2): qty 1

## 2015-07-28 MED ORDER — ADULT MULTIVITAMIN W/MINERALS CH: 1.0000 | ORAL_TABLET | Freq: Every day | ORAL | Status: DC

## 2015-07-28 MED ORDER — SODIUM CHLORIDE 0.9 % IV SOLN: 10.0000 mL/h | Freq: Once | INTRAVENOUS | Status: DC

## 2015-07-28 MED ORDER — HYDROXYCHLOROQUINE SULFATE 200 MG PO TABS
200.0000 mg | ORAL_TABLET | Freq: Every day | ORAL | Status: DC
  Administered 2015-07-29 – 2015-07-31 (×3): 200 mg via ORAL
  Filled 2015-07-28 (×3): qty 1

## 2015-07-28 MED ORDER — SODIUM CHLORIDE 0.9 % IV SOLN
80.0000 mg | Freq: Once | INTRAVENOUS | Status: AC
  Administered 2015-07-28: 80 mg via INTRAVENOUS
  Filled 2015-07-28: qty 80

## 2015-07-28 MED ORDER — OCUVITE-LUTEIN PO CAPS
1.0000 | ORAL_CAPSULE | Freq: Two times a day (BID) | ORAL | Status: DC
  Filled 2015-07-28: qty 1

## 2015-07-28 MED ORDER — CARVEDILOL 6.25 MG PO TABS
6.2500 mg | ORAL_TABLET | Freq: Two times a day (BID) | ORAL | Status: DC
  Administered 2015-07-28 – 2015-07-31 (×6): 6.25 mg via ORAL
  Filled 2015-07-28 (×6): qty 1

## 2015-07-28 MED ORDER — AMLODIPINE BESYLATE 5 MG PO TABS
5.0000 mg | ORAL_TABLET | Freq: Every day | ORAL | Status: DC
  Administered 2015-07-29 – 2015-07-31 (×3): 5 mg via ORAL
  Filled 2015-07-28 (×3): qty 1
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
## 2015-07-28 NOTE — Telephone Encounter (Signed)
FYI

## 2015-07-28 NOTE — Telephone Encounter (Signed)
[place] [facility]  Patient [name]  DOB: [birth year]    Initial Comment Caller states she is caregiver for patient with internal bleeding, being tested to determine cause, has bloody, black stools   Nurse Assessment  Nurse: Wayne Sever, RN, Tillie Rung Date/Time (Eastern Time): 07/28/2015 12:55:11 PM  Confirm and document reason for call. If symptomatic, describe symptoms. You must click the next button to save text entered. ---Caller states he has had some internal bleeding. She states she is his caregiver and he is having black diarrhea.  Has the patient traveled out of the country within the last 30 days? ---Not Applicable  Does the patient have any new or worsening symptoms? ---Yes  Will a triage be completed? ---Yes  Related visit to physician within the last 2 weeks? ---N/A  Does the PT have any chronic conditions? (i.e. diabetes, asthma, etc.) ---Yes  List chronic conditions. ---Type 2 Diabetes, Bypass, CVS  Is this a behavioral health or substance abuse call? ---No     Guidelines    Guideline Title Affirmed Question Affirmed Notes  Rectal Bleeding [1] MODERATE rectal bleeding (small blood clots, passing blood without stool, or toilet water turns red) AND [2] more than once a day    Final Disposition User   Go to ED Now Wayne Sever, RN, [facility] - ED   Disagree/Comply: Comply

## 2015-07-28 NOTE — ED Notes (Signed)
Pt here for rectal bleeding, sts dark stool. sts loose. Denies pain. sts some SOB.
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
## 2015-07-29 DIAGNOSIS — I350 Nonrheumatic aortic (valve) stenosis: Secondary | ICD-10-CM

## 2015-07-29 DIAGNOSIS — K921 Melena: Secondary | ICD-10-CM

## 2015-07-29 DIAGNOSIS — D62 Acute posthemorrhagic anemia: Secondary | ICD-10-CM

## 2015-07-29 DIAGNOSIS — K31819 Angiodysplasia of stomach and duodenum without bleeding: Principal | ICD-10-CM

## 2015-07-29 DIAGNOSIS — I482 Chronic atrial fibrillation: Secondary | ICD-10-CM

## 2015-07-29 LAB — CBC
HCT: 25.6 % — ABNORMAL LOW (ref 39.0–52.0)
HCT: 27.2 % — ABNORMAL LOW (ref 39.0–52.0)
HEMATOCRIT: 25.4 % — AB (ref 39.0–52.0)
HEMOGLOBIN: 8.3 g/dL — AB (ref 13.0–17.0)
HEMOGLOBIN: 8 g/dL — AB (ref 13.0–17.0)
HEMOGLOBIN: 8 g/dL — AB (ref 13.0–17.0)
MCH: 27.7 pg (ref 26.0–34.0)
MCH: 27.1 pg (ref 26.0–34.0)
MCH: 27.6 pg (ref 26.0–34.0)
MCHC: 30.5 g/dL (ref 30.0–36.0)
MCHC: 31.3 g/dL (ref 30.0–36.0)
MCHC: 31.5 g/dL (ref 30.0–36.0)
MCV: 87.9 fL (ref 78.0–100.0)
MCV: 88.3 fL (ref 78.0–100.0)
MCV: 88.9 fL (ref 78.0–100.0)
PLATELETS: 240 10*3/uL (ref 150–400)
PLATELETS: 195 10*3/uL (ref 150–400)
Platelets: 201 10*3/uL (ref 150–400)
RBC: 2.89 MIL/uL — ABNORMAL LOW (ref 4.22–5.81)
RBC: 3.06 MIL/uL — AB (ref 4.22–5.81)
RBC: 2.9 MIL/uL — AB (ref 4.22–5.81)
RDW: 14.2 % (ref 11.5–15.5)
RDW: 14.5 % (ref 11.5–15.5)
RDW: 14.6 % (ref 11.5–15.5)
WBC: 5.7 10*3/uL (ref 4.0–10.5)
WBC: 6.7 10*3/uL (ref 4.0–10.5)
WBC: 6.6 10*3/uL (ref 4.0–10.5)

## 2015-07-29 LAB — GLUCOSE, CAPILLARY
GLUCOSE-CAPILLARY: 107 mg/dL — AB (ref 65–99)
GLUCOSE-CAPILLARY: 115 mg/dL — AB (ref 65–99)
GLUCOSE-CAPILLARY: 132 mg/dL — AB (ref 65–99)
GLUCOSE-CAPILLARY: 147 mg/dL — AB (ref 65–99)
GLUCOSE-CAPILLARY: 73 mg/dL (ref 65–99)
Glucose-Capillary: 107 mg/dL — ABNORMAL HIGH (ref 65–99)
Glucose-Capillary: 117 mg/dL — ABNORMAL HIGH (ref 65–99)
Glucose-Capillary: 269 mg/dL — ABNORMAL HIGH (ref 65–99)
Glucose-Capillary: 413 mg/dL — ABNORMAL HIGH (ref 65–99)
Glucose-Capillary: 96 mg/dL (ref 65–99)

## 2015-07-29 LAB — BRAIN NATRIURETIC PEPTIDE: B NATRIURETIC PEPTIDE 5: 314.2 pg/mL — AB (ref 0.0–100.0)

## 2015-07-29 MED ORDER — INSULIN ASPART 100 UNIT/ML ~~LOC~~ SOLN
0.0000 [IU] | Freq: Three times a day (TID) | SUBCUTANEOUS | Status: DC
  Administered 2015-07-30 (×2): 7 [IU] via SUBCUTANEOUS
  Administered 2015-07-30 – 2015-07-31 (×2): 3 [IU] via SUBCUTANEOUS
  Administered 2015-07-31: 1 [IU] via SUBCUTANEOUS

## 2015-07-29 MED ORDER — POLYETHYLENE GLYCOL 3350 17 G PO PACK
17.0000 g | PACK | Freq: Two times a day (BID) | ORAL | Status: DC
  Administered 2015-07-29 – 2015-07-30 (×2): 17 g via ORAL
  Filled 2015-07-29 (×3): qty 1

## 2015-07-29 MED ORDER — INSULIN GLARGINE 100 UNIT/ML ~~LOC~~ SOLN
25.0000 [IU] | Freq: Every day | SUBCUTANEOUS | Status: DC
  Administered 2015-07-29: 25 [IU] via SUBCUTANEOUS
  Filled 2015-07-29: qty 0.25

## 2015-07-29 MED ORDER — INSULIN ASPART 100 UNIT/ML ~~LOC~~ SOLN
0.0000 [IU] | Freq: Every day | SUBCUTANEOUS | Status: DC
  Administered 2015-07-29: 5 [IU] via SUBCUTANEOUS
  Administered 2015-07-30: 2 [IU] via SUBCUTANEOUS

## 2015-07-29 MED ORDER — HYDROCORTISONE 1 % EX CREA
TOPICAL_CREAM | Freq: Three times a day (TID) | CUTANEOUS | Status: DC | PRN
  Filled 2015-07-29: qty 28

## 2015-07-29 MED ORDER — CETYLPYRIDINIUM CHLORIDE 0.05 % MT LIQD
7.0000 mL | Freq: Two times a day (BID) | OROMUCOSAL | Status: DC
  Administered 2015-07-29 – 2015-07-31 (×6): 7 mL via OROMUCOSAL

## 2015-07-29 MED ORDER — INSULIN ASPART 100 UNIT/ML ~~LOC~~ SOLN
3.0000 [IU] | Freq: Three times a day (TID) | SUBCUTANEOUS | Status: DC
  Administered 2015-07-30 – 2015-07-31 (×5): 3 [IU] via SUBCUTANEOUS
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
## 2015-07-29 NOTE — Progress Notes (Addendum)
[facility] PROGRESS NOTE    Progress Note   [name] [id number] DOB: [birth year] DOA: 07/28/2015 PCP: [name], MD   Brief Narrative:   [name] is an 82 y.o. male history of CAD status post CABG, aortic stenosis status post bioprosthetic valve replacement, chronic atrial fibrillation on Eluquis, CVA about a year ago, GI bleed (with an EGD that showed angiodysplasia which was ablated), he had an enteroscopy in January 2016 that showed 2 small nonbleeding AVMs, readmitted on 06/27/2015 for melena and anemia underwent colonoscopy, with a video capsule performed on 07/25/2015, hypertension history of bladder cancer, chronic diastolic heart failure and chronic kidney disease percent with dark tarry stools. Back in 07/11/2015 his Hbg was 9.1 on admission was 8.0.  Assessment/Plan:   GI bleed/melena/Acute blood loss anemia with a history of AVM's: I think this will be an AVM that has been worsened by anticoagulation. I appreciate GIs assistance. We'll continue to follow hemoglobin, I agree and will discuss with patient that we need to be off anticoagulation, ? If this can be for a short period of time.    Chronic atrial fibrillation: CHA2DS2-VASc Score is 7. Hold Eluquis, heart rate control continue Coreg. This patient has has a calculated ischemic stroke rate of 14% per year without any therapy.  With aspirin 325 mg we'll given him a on adjusted ischemic stroke rate of 11% per year, he does have history of an ulcer. With Eluquis it will give him adjusted ischemic stroke rate of 3.6%. Long discussion with this son who is a cardiologist, ? If he will benefit from the [place] device.  Chronic kidney disease stage III: Creatinine at baseline.  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  History of Aortic stenosis, severe: No acute issues.  Chronic diastolic heart failure: Continue  Coreg and Lasix.  Essential hypertension: Continue Coreg amlodipine and Imdur.  BPH: Continue Flomax.  Anemia in chronic kidney disease Cont to monitor Hbg.  DVT Prophylaxis - SCD's  Family Communication: none Disposition Plan: Home 1-2days Code Status:     Code Status Orders        Start     Ordered   07/28/15 1839  Full code   Continuous     07/28/15 1839    Code Status History    Date Active Date Inactive Code Status Order ID Comments User Context   07/10/2015 11:37 PM 07/12/2015 11:15 PM Full Code [id number]  Rise Patience, MD Inpatient   04/12/2015  7:05 PM 04/17/2015  7:10 PM Full Code [id number]  Geradine Girt, DO Inpatient   06/29/2014 12:24 PM 07/09/2014  2:15 PM Full Code [id number]  Bary Leriche, PA-C Inpatient    Advance Directive Documentation        Most Recent Value   Type of Advance Directive  Living will   Pre-existing out of facility DNR order (yellow form or pink MOST form)     "MOST" Form in Place?          IV Access:    Peripheral IV   Procedures and diagnostic studies:   No results found.   Medical Consultants:    None.  Anti-Infectives:   Anti-infectives    Start     Dose/Rate Route Frequency Ordered Stop   07/28/15 2000  hydroxychloroquine (PLAQUENIL) tablet 200 mg     200 mg Oral Daily 07/28/15 1834  Subjective:    [name] no further black stools in the hospital. Objective:    Filed Vitals:   07/28/15 2045 07/28/15 2124 07/28/15 2205 07/29/15 0640  BP: 119/69 163/70 167/73 149/47  Pulse: 75 65 62 60  Temp: 97.8 F (36.6 C)   98.3 F (36.8 C)  TempSrc: Oral   Oral  Resp: 22   18  Weight:    105 kg (231 lb 7.7 oz)  SpO2: 97%   99%    Intake/Output Summary (Last 24 hours) at 07/29/15 0847 Last data filed at 07/29/15 0217  Gross per 24 hour  Intake   1429 ml  Output   2300 ml  Net   -871 ml   Filed Weights   07/29/15 0640  Weight: 105 kg (231 lb 7.7 oz)    Exam: Gen:   NAD Cardiovascular:  RRR. Chest and lungs:   CTAB Abdomen:  Abdomen soft, NT/ND, + BS Extremities:  No edema   Data Reviewed:    Labs: Basic Metabolic Panel:  Recent Labs Lab 07/28/15 1628  NA 137  K 4.6  CL 104  CO2 23  GLUCOSE 121*  BUN 44*  CREATININE 2.08*  CALCIUM 9.1   GFR Estimated Creatinine Clearance: 30.9 mL/min (by C-G formula based on Cr of 2.08). Liver Function Tests:  Recent Labs Lab 07/28/15 1628  AST 24  ALT 16*  ALKPHOS 82  BILITOT 0.5  PROT 6.3*  ALBUMIN 3.3*   No results for input(s): LIPASE, AMYLASE in the last 168 hours. No results for input(s): AMMONIA in the last 168 hours. Coagulation profile  Recent Labs Lab 07/28/15 1628  INR 1.20    CBC:  Recent Labs Lab 07/28/15 1628 07/28/15 2154 07/29/15 0005 07/29/15 0610  WBC 6.2 6.1 6.6 5.7  HGB 7.8* 8.4* 8.0* 8.0*  HCT 25.1* 26.7* 25.6* 25.4*  MCV 88.4 88.1 88.3 87.9  PLT 229 200 195 201   Cardiac Enzymes: No results for input(s): CKTOTAL, CKMB, CKMBINDEX, TROPONINI in the last 168 hours. BNP (last 3 results)  Recent Labs  07/10/15 1330  PROBNP 211.0*   CBG:  Recent Labs Lab 07/29/15 0215 07/29/15 0406 07/29/15 0551 07/29/15 0642 07/29/15 0755  GLUCAP 96 107* 73 147* 132*   D-Dimer: No results for input(s): DDIMER in the last 72 hours. Hgb A1c: No results for input(s): HGBA1C in the last 72 hours. Lipid Profile: No results for input(s): CHOL, HDL, LDLCALC, TRIG, CHOLHDL, LDLDIRECT in the last 72 hours. Thyroid function studies: No results for input(s): TSH, T4TOTAL, T3FREE, THYROIDAB in the last 72 hours.  Invalid input(s): FREET3 Anemia work up: No results for input(s): VITAMINB12, FOLATE, FERRITIN, TIBC, IRON, RETICCTPCT in the last 72 hours. Sepsis Labs:  Recent Labs Lab 07/28/15 1628 07/28/15 2154 07/29/15 0005 07/29/15 0610  WBC 6.2 6.1 6.6 5.7   Microbiology No results found for this or any previous visit (from the past 240  hour(s)).   Medications:   . sodium chloride  10 mL/hr Intravenous Once  . amLODipine  5 mg Oral Daily  . antiseptic oral rinse  7 mL Mouth Rinse BID  . [START ON 07/31/2015] calcitRIOL  0.25 mcg Oral Q M,W,F  . carvedilol  6.25 mg Oral BID  . cholecalciferol  1,000 Units Oral Daily  . escitalopram  10 mg Oral Daily  . ferrous sulfate  325 mg Oral Q breakfast  . furosemide  20 mg Oral Daily  . hydroxychloroquine  200 mg Oral Daily  . insulin  aspart  0-9 Units Subcutaneous TID WC  . isosorbide mononitrate  30 mg Oral Daily  . multivitamin with minerals  1 tablet Oral Daily  . sodium chloride flush  3 mL Intravenous Q12H  . tamsulosin  0.4 mg Oral QPC supper  . vitamin C  500 mg Oral Daily   Continuous Infusions: . pantoprozole (PROTONIX) infusion 8 mg/hr (07/29/15 0508)    Time spent: 25 min   LOS: 1 day   Charlynne Cousins  [facility] Pager [phone]  *Please refer to [email or web address], password [email or web address] to get updated schedule on who will round on this patient, as hospitalists switch teams weekly. If 7PM-7AM, please contact night-coverage at [email or web address], password [email or web address] for any overnight needs.  07/29/2015, 8:47 AM

## 2015-07-29 NOTE — Evaluation (Signed)
Physical Therapy Evaluation Patient Details [name] MRN: [id number] DOB: June 12, 1927 Today's Date: 07/29/2015   History of Present Illness  82 y.o. male with PMH of CAD, s/p of CABG, aortic stenosis, s/p of bioprosthetic aortic valve replacement, chronic atrial fibrillation on Eliqis, GI bleed (EGD showed angiodysplasia which was ablated), hypertension, hyperlipidemia, diabetes mellitus, GERD, depression, BPH, bladder cancer, stroke, macular degeneration, diastolic congestive heart failure with EF 60-65%, CKD-III, who presented to the ED with dark stool.  He was admitted with dx of GI bleed.  Clinical Impression  Pt admitted with above diagnosis. Pt currently with functional limitations due to the deficits listed below (see PT Problem List). On eval, pt required min guard assist for transfers and gait with RW 150 feet. Pt will benefit from skilled PT to increase their independence and safety with mobility to allow discharge to the venue listed below.       Follow Up Recommendations Home health PT;Supervision - Intermittent (Pt prefers [facility].)    Equipment Recommendations  None recommended by PT    Recommendations for Other Services       Precautions / Restrictions Precautions Precautions: Fall      Mobility  Bed Mobility               General bed mobility comments: Pt up in recliner upon arrival and returned to recliner after ambulation.  Transfers Overall transfer level: Needs assistance Equipment used: Rolling walker (2 wheeled) Transfers: Sit to/from Stand Sit to Stand: Min guard         General transfer comment: increased time to complete, pt demo safe technique.  Ambulation/Gait Ambulation/Gait assistance: Min guard Ambulation Distance (Feet): 150 Feet Assistive device: Rolling walker (2 wheeled) Gait Pattern/deviations: Step-through pattern;Decreased stride length;Trunk flexed Gait velocity: decreased Gait velocity interpretation:  Below normal speed for age/gender General Gait Details: slow, steady gait. Mild DOE with increased distance.  Stairs            Wheelchair Mobility    Modified Rankin (Stroke Patients Only)       Balance                                             Pertinent Vitals/Pain Pain Assessment: No/denies pain    Home Living Family/patient expects to be discharged to:: Private residence Living Arrangements: Alone Available Help at Discharge: Personal care attendant;Available PRN/intermittently Type of Home: House Home Access: Stairs to enter Entrance Stairs-Rails: Left Entrance Stairs-Number of Steps: 3 Home Layout: Two level;Bed/bath upstairs Home Equipment: Cane - single point;Walker - 2 wheels;Shower seat Additional Comments: Pt has grab bars for toilet and shower. Pt has walk in shower and tub/shower combo. He owns shower chair.      Prior Function Level of Independence: Needs assistance   Gait / Transfers Assistance Needed: Uses SPC for household ambulation vs furniture walking and RW for community ambulatin "so I don't fall." No falls reported.   ADL's / Homemaking Assistance Needed: Some assist with washing back, bathing and dressing from PCA.  Comments: son is MD; was a Insurance underwriter. PCA comes every afternoon for a few hours- takes him to appt and to exercise class twice/week.     Hand Dominance   Dominant Hand: Right    Extremity/Trunk Assessment           LUE Deficits / Details: Weak grip - residual  from prior CVA   Lower Extremity Assessment: Generalized weakness         Communication   Communication: No difficulties  Cognition Arousal/Alertness: Awake/alert Behavior During Therapy: WFL for tasks assessed/performed Overall Cognitive Status: Within Functional Limits for tasks assessed                      General Comments      Exercises        Assessment/Plan    PT Assessment Patient needs continued PT services  PT  Diagnosis Difficulty walking;Generalized weakness   PT Problem List Decreased strength;Cardiopulmonary status limiting activity;Decreased activity tolerance;Decreased balance;Decreased mobility;Decreased coordination  PT Treatment Interventions Balance training;Gait training;Functional mobility training;Therapeutic activities;Therapeutic exercise;Patient/family education;Stair training   PT Goals (Current goals can be found in the Care Plan section) Acute Rehab PT Goals Patient Stated Goal: to return to exercise class and to go home PT Goal Formulation: With patient Time For Goal Achievement: 08/12/15 Potential to Achieve Goals: Good    Frequency Min 3X/week   Barriers to discharge        Co-evaluation               End of Session Equipment Utilized During Treatment: Gait belt Activity Tolerance: Patient tolerated treatment well Patient left: in chair;with call bell/phone within reach Nurse Communication: Mobility status         Time: [id number] PT Time Calculation (min) (ACUTE ONLY): 16 min   Charges:   PT Evaluation $PT Eval Moderate Complexity: 1 Procedure     PT G Codes:        Lorriane Shire 07/29/2015, 10:16 AM

## 2015-07-29 NOTE — Progress Notes (Signed)
Hypoglycemic Event  CBG: 42  Treatment: D50 IV 50 mL  Symptoms: Pale  Follow-up CBG: Time:2113 CBG Result:128  Possible Reasons for Event: Inadequate meal intake  Comments/MD notified:Dr.NIU    [name], [employer]

## 2015-07-30 ENCOUNTER — Encounter (HOSPITAL_COMMUNITY): Payer: Self-pay | Admitting: Internal Medicine

## 2015-07-30 DIAGNOSIS — I5032 Chronic diastolic (congestive) heart failure: Secondary | ICD-10-CM

## 2015-07-30 DIAGNOSIS — N183 Chronic kidney disease, stage 3 (moderate): Secondary | ICD-10-CM

## 2015-07-30 DIAGNOSIS — N189 Chronic kidney disease, unspecified: Secondary | ICD-10-CM

## 2015-07-30 DIAGNOSIS — D631 Anemia in chronic kidney disease: Secondary | ICD-10-CM

## 2015-07-30 DIAGNOSIS — I10 Essential (primary) hypertension: Secondary | ICD-10-CM

## 2015-07-30 DIAGNOSIS — D649 Anemia, unspecified: Secondary | ICD-10-CM

## 2015-07-30 DIAGNOSIS — E1142 Type 2 diabetes mellitus with diabetic polyneuropathy: Secondary | ICD-10-CM

## 2015-07-30 LAB — CBC
HCT: 28.8 % — ABNORMAL LOW (ref 39.0–52.0)
HEMATOCRIT: 23.9 % — AB (ref 39.0–52.0)
HEMOGLOBIN: 7.6 g/dL — AB (ref 13.0–17.0)
Hemoglobin: 8.9 g/dL — ABNORMAL LOW (ref 13.0–17.0)
MCH: 27.8 pg (ref 26.0–34.0)
MCH: 27.5 pg (ref 26.0–34.0)
MCHC: 31.8 g/dL (ref 30.0–36.0)
MCHC: 30.9 g/dL (ref 30.0–36.0)
MCV: 87.5 fL (ref 78.0–100.0)
MCV: 88.9 fL (ref 78.0–100.0)
PLATELETS: 200 10*3/uL (ref 150–400)
Platelets: 205 10*3/uL (ref 150–400)
RBC: 2.73 MIL/uL — ABNORMAL LOW (ref 4.22–5.81)
RBC: 3.24 MIL/uL — AB (ref 4.22–5.81)
RDW: 14.4 % (ref 11.5–15.5)
RDW: 14.2 % (ref 11.5–15.5)
WBC: 6.1 10*3/uL (ref 4.0–10.5)
WBC: 6.5 10*3/uL (ref 4.0–10.5)

## 2015-07-30 LAB — GLUCOSE, CAPILLARY
GLUCOSE-CAPILLARY: 212 mg/dL — AB (ref 65–99)
Glucose-Capillary: 371 mg/dL — ABNORMAL HIGH (ref 65–99)
Glucose-Capillary: 346 mg/dL — ABNORMAL HIGH (ref 65–99)
Glucose-Capillary: 314 mg/dL — ABNORMAL HIGH (ref 65–99)
Glucose-Capillary: 214 mg/dL — ABNORMAL HIGH (ref 65–99)

## 2015-07-30 LAB — PREPARE RBC (CROSSMATCH)

## 2015-07-30 MED ORDER — PANTOPRAZOLE SODIUM 40 MG PO TBEC: 40.0000 mg | DELAYED_RELEASE_TABLET | Freq: Every day | ORAL | Status: DC

## 2015-07-30 MED ORDER — INSULIN GLARGINE 100 UNIT/ML ~~LOC~~ SOLN
20.0000 [IU] | Freq: Once | SUBCUTANEOUS | Status: AC
  Administered 2015-07-30: 20 [IU] via SUBCUTANEOUS
  Filled 2015-07-30: qty 0.2

## 2015-07-30 MED ORDER — INSULIN GLARGINE 100 UNIT/ML ~~LOC~~ SOLN
35.0000 [IU] | Freq: Two times a day (BID) | SUBCUTANEOUS | Status: DC
  Administered 2015-07-30: 35 [IU] via SUBCUTANEOUS
  Filled 2015-07-30 (×3): qty 0.35

## 2015-07-30 MED ORDER — SODIUM CHLORIDE 0.9 % IV SOLN
Freq: Once | INTRAVENOUS | Status: AC
  Administered 2015-07-30: 14:00:00 via INTRAVENOUS

## 2015-07-30 MED ORDER — INSULIN GLARGINE 100 UNIT/ML ~~LOC~~ SOLN
25.0000 [IU] | Freq: Two times a day (BID) | SUBCUTANEOUS | Status: DC
  Administered 2015-07-30: 25 [IU] via SUBCUTANEOUS
  Filled 2015-07-30 (×2): qty 0.25
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
## 2015-07-30 NOTE — Discharge Summary (Addendum)
Physician Discharge Summary  [name] [id number] DOB: Jan 29, 1928 DOA: 07/28/2015  PCP: [name], MD  Admit date: 07/28/2015 Discharge date: 07/31/2015  Time spent: 35 minutes  Recommendations for Outpatient Follow-up:  1. Follow-up Cardiology in 1-2 weeks, , will decide at this time whether we should start anticoagulation.  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 male with PMH of CAD, s/p of CABG, aortic stenosis, s/p of bioprosthetic aortic valve replacement, chronic atrial fibrillation on Eliqis, GI bleed (EGD showed angiodysplasia which was ablated), hypertension, hyperlipidemia, diabetes mellitus, GERD, depression, BPH, bladder cancer, stroke, macular degeneration, diastolic congestive heart failure with EF 60-65%, CKD-III, who presents with dark stool  Hospital Course:  Lower GI bleed/melena/acute blood loss anemia with history of AVMs: GI was consulted they recommended serial  hemoglobins,no further endoscopic procedure will benefit this gentleman at this point. There was a mild drop in hemoglobin, he was transfuse 1 units of PRBC and check a CBC to be check as an outpatient. He's had no further melanotic stools in-house.  Chronic atrial fibrillation: CHA2DS2-VASc Score is 7. Hold Eluquis, heart rate control continue Coreg. This patient has has a calculated ischemic stroke rate of 14% per year without any therapy.  With aspirin 325 mg we'll given him a on adjusted ischemic stroke rate of 11% per year, he does have history of an ulcer. With Eluquis it will give him adjusted ischemic stroke rate of 3.6%. Cardiology was consulted to see if patient qualifies for a watchman device. Recommended to keep him off Eluquis and follow-up with them as an outpatient.  Chronic and see stage III: Creatinine remained at baseline.  Well-controlled diabetes noticed type II with nephropathy with an A1c of 6.9: We ha ve increase his long acting insulin to 40 unit bid.  History of aortic stenosis: No active issues.  Chronic diastolic heart failure: continue Coreg and Lasix no changes were made.  Essential hypertension: Continued Coreg, amlodipine and Imdur.  BPH: Continue Flomax.  Procedures:  none  Consultations:  GI  Cardiology  Discharge Exam: Filed Vitals:   07/31/15 0104 07/31/15 0453  BP: 129/64 142/46  Pulse: 64 69  Temp:  98.1 F (36.7 C)  Resp:  18    General: A&O x3 Cardiovascular: RRR Respiratory: good air movement CTA B/L  Discharge Instructions   Discharge Instructions    Diet - low sodium heart healthy    Complete by:  As directed  Increase activity slowly    Complete by:  As directed           Current Discharge Medication List    START taking these medications   Details  !! pantoprazole (PROTONIX) 40 MG tablet Take 1 tablet (40 mg total) by mouth daily. Qty: 60 tablet, Refills: 3     !! - Potential duplicate medications  found. Please discuss with provider.    CONTINUE these medications which have CHANGED   Details  insulin glargine (LANTUS) 100 UNIT/ML injection Inject 45 units into the skin twice a day Qty: 10 mL, Refills: 6      CONTINUE these medications which have NOT CHANGED   Details  amLODipine (NORVASC) 5 MG tablet TAKE 1 TABLET EVERY DAY Qty: 30 tablet, Refills: 5    calcitRIOL (ROCALTROL) 0.25 MCG capsule Take 0.25 mcg by mouth every Monday, Wednesday, and Friday.    carvedilol (COREG) 6.25 MG tablet Take 6.25 mg by mouth 2 (two) times daily.    Cholecalciferol (VITAMIN D PO) Take 1 tablet by mouth daily.    docusate sodium (COLACE) 100 MG capsule Take 100 mg by mouth daily.    escitalopram (LEXAPRO) 10 MG tablet Take 1 tablet (10 mg total) by mouth daily. With dinner Qty: 30 tablet, Refills: 4    ferrous sulfate 325 (65 FE) MG tablet Take 325 mg by mouth daily with breakfast.    furosemide (LASIX) 40 MG tablet TAKE ONE-HALF TABLET DAILY Qty: 30 tablet, Refills: 5    hydroxychloroquine (PLAQUENIL) 200 MG tablet Take 200 mg by mouth daily.    insulin aspart (NOVOLOG) 100 UNIT/ML injection Inject 3-7 Units into the skin 3 (three) times daily as needed for high blood sugar (CBG >100). Per sliding scale: CBG 100-150 3 units, 151-200 4 units, 201-250 5 units, >250 7 units    isosorbide mononitrate (IMDUR) 30 MG 24 hr tablet Take 1 tablet (30 mg total) by mouth daily. Qty: 30 tablet, Refills: 0    !! Multiple Vitamin (MULTIVITAMIN WITH MINERALS) TABS tablet Take 1 tablet by mouth daily.    !! Multiple Vitamins-Minerals (PRESERVISION AREDS PO) Take 1 capsule by mouth 2 (two) times daily.     !! pantoprazole (PROTONIX) 40 MG tablet TAKE 1 TABLET EVERY DAY Qty: 30 tablet, Refills: 11    tamsulosin (FLOMAX) 0.4 MG CAPS capsule TAKE ONE CAPSULE DAILY AFTER SUPPER Qty: 30 capsule, Refills: 3    vitamin C (ASCORBIC ACID) 500 MG tablet Take 500 mg by mouth daily.     !! - Potential  duplicate medications found. Please discuss with provider.    STOP taking these medications     apixaban (ELIQUIS) 2.5 MG TABS tablet        Allergies  Allergen Reactions  . Asa [Aspirin] Other (See Comments)    Caused ulcer  . Metoprolol Other (See Comments)    bradycardia  . Penicillins Swelling    Has patient had a PCN reaction causing immediate rash, facial/tongue/throat swelling, SOB or lightheadedness with hypotension: No Has patient had a PCN reaction causing severe rash involving mucus membranes or skin necrosis: No Has patient had a PCN reaction that required hospitalization No Has patient had a PCN reaction occurring within the last 10 years: No If all of the above answers are "NO", then may proceed with Cephalosporin use.    Follow-up Information    Follow up with Thompson Grayer, MD In 1 week.   Specialty:  Cardiology   Why:  Hospital follow-up  Contact information:   [address] [place] [address] [phone]        The results of significant diagnostics from this hospitalization (including imaging, microbiology, ancillary and laboratory) are listed below for reference.    Significant Diagnostic Studies: Dg Chest Port 1 View  07/10/2015  CLINICAL DATA:  Shortness of breath on exertion. Hypertension and GI bleeding EXAM: PORTABLE CHEST 1 VIEW COMPARISON:  04/14/2015 FINDINGS: Chronic cardiopericardial enlargement. The patient is status post median sternotomy for aortic valve replacement. Stable aortic and hilar contours. Pulmonary venous congestion without edema or consolidation. No effusion or air leak IMPRESSION: Cardiomegaly and mild venous congestion. Electronically Signed   By: Monte Fantasia M.D.   On: 07/10/2015 23:24    Microbiology: No results found for this or any previous visit (from the past 240 hour(s)).   Labs: Basic Metabolic Panel:  Recent Labs Lab 07/28/15 1628  NA 137  K 4.6  CL 104  CO2 23  GLUCOSE 121*  BUN 44*   CREATININE 2.08*  CALCIUM 9.1   Liver Function Tests:  Recent Labs Lab 07/28/15 1628  AST 24  ALT 16*  ALKPHOS 82  BILITOT 0.5  PROT 6.3*  ALBUMIN 3.3*   No results for input(s): LIPASE, AMYLASE in the last 168 hours. No results for input(s): AMMONIA in the last 168 hours. CBC:  Recent Labs Lab 07/29/15 0610 07/29/15 1812 07/30/15 0635 07/30/15 1951 07/31/15 0705  WBC 5.7 6.7 6.1 6.5 6.4  HGB 8.0* 8.3* 7.6* 8.9* 9.3*  HCT 25.4* 27.2* 23.9* 28.8* 28.5*  MCV 87.9 88.9 87.5 88.9 88.8  PLT 201 240 205 200 203   Cardiac Enzymes: No results for input(s): CKTOTAL, CKMB, CKMBINDEX, TROPONINI in the last 168 hours. BNP: BNP (last 3 results)  Recent Labs  04/13/15 0905 07/29/15 0005  BNP 247.2* 314.2*    ProBNP (last 3 results)  Recent Labs  07/10/15 1330  PROBNP 211.0*    CBG:  Recent Labs Lab 07/30/15 0754 07/30/15 1154 07/30/15 1717 07/30/15 2129 07/31/15 0100  GLUCAP 346* 314* 212* 214* 241*    Signed:  Charlynne Cousins MD.  [facility] 07/31/2015, 9:24 AM

## 2015-07-30 NOTE — Progress Notes (Signed)
[facility] Progress Note  Chief Complaint: melena  Subjective History:  No further melena since admission.  Hgb down to 7.6 Off anticoagulation 48 hrs Spoke with son, who is planning EP eval for possible left atrial appendage occlusive device, but that would require coumadin for few months, then aspirin/plavix for a few more.   ROS: Cardiovascular:  no chest pain Respiratory: no dyspnea  Objective:  Med list reviewed  Vital signs in last 24 hrs: Filed Vitals:   07/29/15 2344 07/30/15 0543  BP: 141/56 152/67  Pulse: 70 71  Temp: 99 F (37.2 C) 98.2 F (36.8 C)  Resp: 16 18    Physical Exam    Cardiac: irregular without murmurs, S1S2 heard,mild peripheral edema  Pulm: clear to auscultation bilaterally, normal RR and effort noted  Abdomen: obese, soft, no tenderness, with active bowel sounds. No guarding or palpable hepatosplenomegaly  Skin; warm and dry, no jaundice or rash  Recent Labs:   Recent Labs Lab 07/29/15 0610 07/29/15 1812 07/30/15 0635  WBC 5.7 6.7 6.1  HGB 8.0* 8.3* 7.6*  HCT 25.4* 27.2* 23.9*  PLT 201 240 205    Recent Labs Lab 07/28/15 1628  NA 137  K 4.6  CL 104  CO2 23  BUN 44*  ALBUMIN 3.3*  ALKPHOS 82  ALT 16*  AST 24  GLUCOSE 121*    Recent Labs Lab 07/28/15 1628  INR 1.20    @ASSESSMENTPLANBEGIN @ Assessment:  Melena - suspected small bowel source not seen on 07/25/15 video capsule study. Anemia of acute on chronic blood loss.     Plan: Consider PRBC transfusion, however he is apparently about to be discharged. He will be off anticoagulation for 1-2 weeks.  Difficult situation - It is only a matter of time before he bleeds again, sooner if he goes back on eliquis.  I am afraid we cannot localize the bleeding and therefore offer any definitive therapy. He will need supportive and transfusion therapy as long as he continues to bleed.   Nelida Meuse III Pager [phone] Mon-Fri 8a-5p [phone] after 5p,  weekends, holidays

## 2015-07-30 NOTE — Progress Notes (Signed)
[facility] PROGRESS NOTE    Progress Note   [name] [id number] DOB: [birth year] DOA: 07/28/2015 PCP: [name], MD   Brief Narrative:   [name] is an 82 y.o. male history of CAD status post CABG, aortic stenosis status post bioprosthetic valve replacement, chronic atrial fibrillation on Eluquis, CVA about a year ago, GI bleed (with an EGD that showed angiodysplasia which was ablated), he had an enteroscopy in January 2016 that showed 2 small nonbleeding AVMs, readmitted on 06/27/2015 for melena and anemia underwent colonoscopy, with a video capsule performed on 07/25/2015, hypertension history of bladder cancer, chronic diastolic heart failure and chronic kidney disease percent with dark tarry stools. Back in 07/11/2015 his Hbg was 9.1 on admission was 8.0.  Assessment/Plan:   GI bleed/melena/Acute blood loss anemia with a history of AVM's: No further bleeding. I appreciate GIs assistance. Mild drop in his hbg, will transfuse one unit of PRBC. Check a CBC post-transfusional. Will probably go home tomorrow.    Chronic atrial fibrillation: CHA2DS2-VASc Score is 7. Hold Eluquis, heart rate control continue Coreg. Will be of anticoagulation for 1-2 weeks will follow up with Cards as an outpatient.  Chronic kidney disease stage III: Creatinine at baseline.  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  History of Aortic stenosis, severe: No acute issues.  Chronic diastolic heart failure: Continue Coreg and Lasix.  Essential hypertension: Continue Coreg amlodipine and Imdur.  BPH: Continue Flomax.   DVT Prophylaxis - SCD's  Family Communication: none Disposition Plan: Home 1-2days Code Status:     Code Status Orders        Start     Ordered   07/28/15 1839  Full code   Continuous     07/28/15 1839    Code Status History    Date Active Date Inactive Code  Status Order ID Comments User Context   07/10/2015 11:37 PM 07/12/2015 11:15 PM Full Code [id number]  Rise Patience, MD Inpatient   04/12/2015  7:05 PM 04/17/2015  7:10 PM Full Code [id number]  Geradine Girt, DO Inpatient   06/29/2014 12:24 PM 07/09/2014  2:15 PM Full Code [id number]  Bary Leriche, PA-C Inpatient    Advance Directive Documentation        Most Recent Value   Type of Advance Directive  Living will   Pre-existing out of facility DNR order (yellow form or pink MOST form)     "MOST" Form in Place?          IV Access:    Peripheral IV   Procedures and diagnostic studies:   No results found.   Medical Consultants:    None.  Anti-Infectives:   Anti-infectives    Start     Dose/Rate Route Frequency Ordered Stop   07/28/15 2000  hydroxychloroquine (PLAQUENIL) tablet 200 mg     200 mg Oral Daily 07/28/15 1834        Subjective:    [name] no further melena in the hospital. Objective:    Filed Vitals:   07/29/15 1048 07/29/15 1241 07/29/15 2344 07/30/15 0543  BP: 146/54 133/58 141/56 152/67  Pulse:  68 70 71  Temp:  98 F (36.7 C) 99 F (37.2 C) 98.2 F (36.8 C)  TempSrc:  Oral Oral Oral  Resp:  18 16 18   Weight:    105.5 kg (232 lb 9.4 oz)  SpO2:  99% 99% 97%  Intake/Output Summary (Last 24 hours) at 07/30/15 1513 Last data filed at 07/30/15 1446  Gross per 24 hour  Intake    240 ml  Output   1200 ml  Net   -960 ml   Filed Weights   07/29/15 0640 07/30/15 0543  Weight: 105 kg (231 lb 7.7 oz) 105.5 kg (232 lb 9.4 oz)    Exam: Gen:  NAD Cardiovascular:  RRR. Chest and lungs:   CTAB Abdomen:  Abdomen soft, NT/ND, + BS Extremities:  No edema   Data Reviewed:    Labs: Basic Metabolic Panel:  Recent Labs Lab 07/28/15 1628  NA 137  K 4.6  CL 104  CO2 23  GLUCOSE 121*  BUN 44*  CREATININE 2.08*  CALCIUM 9.1   GFR Estimated Creatinine Clearance: 31 mL/min (by C-G formula based on Cr of 2.08). Liver Function  Tests:  Recent Labs Lab 07/28/15 1628  AST 24  ALT 16*  ALKPHOS 82  BILITOT 0.5  PROT 6.3*  ALBUMIN 3.3*   No results for input(s): LIPASE, AMYLASE in the last 168 hours. No results for input(s): AMMONIA in the last 168 hours. Coagulation profile  Recent Labs Lab 07/28/15 1628  INR 1.20    CBC:  Recent Labs Lab 07/28/15 2154 07/29/15 0005 07/29/15 0610 07/29/15 1812 07/30/15 0635  WBC 6.1 6.6 5.7 6.7 6.1  HGB 8.4* 8.0* 8.0* 8.3* 7.6*  HCT 26.7* 25.6* 25.4* 27.2* 23.9*  MCV 88.1 88.3 87.9 88.9 87.5  PLT 200 195 201 240 205   Cardiac Enzymes: No results for input(s): CKTOTAL, CKMB, CKMBINDEX, TROPONINI in the last 168 hours. BNP (last 3 results)  Recent Labs  07/10/15 1330  PROBNP 211.0*   CBG:  Recent Labs Lab 07/29/15 1153 07/29/15 1733 07/29/15 2045 07/30/15 0754 07/30/15 1154  GLUCAP 269* 413* 371* 346* 314*   D-Dimer: No results for input(s): DDIMER in the last 72 hours. Hgb A1c: No results for input(s): HGBA1C in the last 72 hours. Lipid Profile: No results for input(s): CHOL, HDL, LDLCALC, TRIG, CHOLHDL, LDLDIRECT in the last 72 hours. Thyroid function studies: No results for input(s): TSH, T4TOTAL, T3FREE, THYROIDAB in the last 72 hours.  Invalid input(s): FREET3 Anemia work up: No results for input(s): VITAMINB12, FOLATE, FERRITIN, TIBC, IRON, RETICCTPCT in the last 72 hours. Sepsis Labs:  Recent Labs Lab 07/29/15 0005 07/29/15 0610 07/29/15 1812 07/30/15 0635  WBC 6.6 5.7 6.7 6.1   Microbiology No results found for this or any previous visit (from the past 240 hour(s)).   Medications:   . sodium chloride  10 mL/hr Intravenous Once  . sodium chloride   Intravenous Once  . amLODipine  5 mg Oral Daily  . antiseptic oral rinse  7 mL Mouth Rinse BID  . [START ON 07/31/2015] calcitRIOL  0.25 mcg Oral Q M,W,F  . carvedilol  6.25 mg Oral BID  . cholecalciferol  1,000 Units Oral Daily  . escitalopram  10 mg Oral Daily  .  ferrous sulfate  325 mg Oral Q breakfast  . furosemide  20 mg Oral Daily  . hydroxychloroquine  200 mg Oral Daily  . insulin aspart  0-5 Units Subcutaneous QHS  . insulin aspart  0-9 Units Subcutaneous TID WC  . insulin aspart  3 Units Subcutaneous TID WC  . insulin glargine  25 Units Subcutaneous BID  . isosorbide mononitrate  30 mg Oral Daily  . multivitamin with minerals  1 tablet Oral Daily  . polyethylene glycol  17 g Oral  BID  . sodium chloride flush  3 mL Intravenous Q12H  . tamsulosin  0.4 mg Oral QPC supper  . vitamin C  500 mg Oral Daily   Continuous Infusions: . pantoprozole (PROTONIX) infusion 8 mg/hr (07/30/15 0306)    Time spent: 15 min   LOS: 2 days   Charlynne Cousins  [facility] Pager [phone]  *Please refer to [email or web address], password [email or web address] to get updated schedule on who will round on this patient, as hospitalists switch teams weekly. If 7PM-7AM, please contact night-coverage at [email or web address], password [email or web address] for any overnight needs.  07/30/2015, 3:13 PM

## 2015-07-31 ENCOUNTER — Other Ambulatory Visit: Payer: Self-pay | Admitting: Nurse Practitioner

## 2015-07-31 LAB — TYPE AND SCREEN
ABO/RH(D): O POS
ANTIBODY SCREEN: NEGATIVE
UNIT DIVISION: 0
UNIT DIVISION: 0

## 2015-07-31 LAB — CBC
HEMATOCRIT: 28.5 % — AB (ref 39.0–52.0)
HEMOGLOBIN: 9.3 g/dL — AB (ref 13.0–17.0)
MCH: 29 pg (ref 26.0–34.0)
MCHC: 32.6 g/dL (ref 30.0–36.0)
MCV: 88.8 fL (ref 78.0–100.0)
Platelets: 203 10*3/uL (ref 150–400)
RBC: 3.21 MIL/uL — AB (ref 4.22–5.81)
RDW: 14.5 % (ref 11.5–15.5)
WBC: 6.4 10*3/uL (ref 4.0–10.5)

## 2015-07-31 LAB — GLUCOSE, CAPILLARY
GLUCOSE-CAPILLARY: 241 mg/dL — AB (ref 65–99)
GLUCOSE-CAPILLARY: 147 mg/dL — AB (ref 65–99)
GLUCOSE-CAPILLARY: 232 mg/dL — AB (ref 65–99)

## 2015-07-31 MED ORDER — INSULIN GLARGINE 100 UNIT/ML ~~LOC~~ SOLN: SUBCUTANEOUS | Status: DC

## 2015-07-31 MED ORDER — INSULIN GLARGINE 100 UNIT/ML ~~LOC~~ SOLN
50.0000 [IU] | Freq: Two times a day (BID) | SUBCUTANEOUS | Status: DC
  Administered 2015-07-31: 50 [IU] via SUBCUTANEOUS
  Filled 2015-07-31 (×2): qty 0.5

## 2015-07-31 NOTE — Progress Notes (Signed)
NURSING PROGRESS NOTE  MANDY ENGLIN [id number] Discharge Data: 07/31/2015 1:53 PM Attending Provider: Charlynne Cousins, MD [id number], Aris Everts, MD     Wonda Cheng Barrilleaux to be D/C'd Home per MD order with home health.  Discussed with the patient and his daughter in law Jackelyn Poling the After Visit Summary and all questions fully answered. All IV's discontinued with no bleeding noted. All belongings returned to patient for patient to take home. Prescription for medication x 1 was called into [facility] for pickup. Pt with no further questions at this point. Pt taken downstairs via wheelchair by nurse to car.  Last Vital Signs:  Blood pressure 150/60, pulse 66, temperature 98.1 F (36.7 C), temperature source Oral, resp. rate 18, weight 104.7 kg (230 lb 13.2 oz), SpO2 100 %.  Discharge Medication List   Medication List    STOP taking these medications        apixaban 2.5 MG Tabs tablet  Commonly known as:  ELIQUIS      TAKE these medications        amLODipine 5 MG tablet  Commonly known as:  NORVASC  TAKE 1 TABLET EVERY DAY     calcitRIOL 0.25 MCG capsule  Commonly known as:  ROCALTROL  Take 0.25 mcg by mouth every Monday, Wednesday, and Friday.     carvedilol 6.25 MG tablet  Commonly known as:  COREG  Take 6.25 mg by mouth 2 (two) times daily.     docusate sodium 100 MG capsule  Commonly known as:  COLACE  Take 100 mg by mouth daily.     escitalopram 10 MG tablet  Commonly known as:  LEXAPRO  Take 1 tablet (10 mg total) by mouth daily. With dinner     ferrous sulfate 325 (65 FE) MG tablet  Take 325 mg by mouth daily with breakfast.     furosemide 40 MG tablet  Commonly known as:  LASIX  TAKE ONE-HALF TABLET DAILY     hydroxychloroquine 200 MG tablet  Commonly known as:  PLAQUENIL  Take 200 mg by mouth daily.     insulin aspart 100 UNIT/ML injection  Commonly known as:  novoLOG  Inject 3-7 Units into the skin 3 (three) times daily as needed for high blood  sugar (CBG >100). Per sliding scale: CBG 100-150 3 units, 151-200 4 units, 201-250 5 units, >250 7 units     insulin glargine 100 UNIT/ML injection  Commonly known as:  LANTUS  Inject 45 units into the skin twice a day     isosorbide mononitrate 30 MG 24 hr tablet  Commonly known as:  IMDUR  Take 1 tablet (30 mg total) by mouth daily.     multivitamin with minerals Tabs tablet  Take 1 tablet by mouth daily.     pantoprazole 40 MG tablet  Commonly known as:  PROTONIX  TAKE 1 TABLET EVERY DAY     pantoprazole 40 MG tablet  Commonly known as:  PROTONIX  Take 1 tablet (40 mg total) by mouth daily.     PRESERVISION AREDS PO  Take 1 capsule by mouth 2 (two) times daily.     tamsulosin 0.4 MG Caps capsule  Commonly known as:  FLOMAX  TAKE ONE CAPSULE DAILY AFTER SUPPER     vitamin C 500 MG tablet  Commonly known as:  ASCORBIC ACID  Take 500 mg by mouth daily.     VITAMIN D PO  Take 1 tablet by mouth daily.

## 2015-07-31 NOTE — Care Management Important Message (Signed)
Important Message  Patient Details  [name] MRN: [id number] Date of Birth: 1928-05-07   Medicare Important Message Given:  Yes    [name] 07/31/2015, 11:34 AM

## 2015-07-31 NOTE — Care Management Note (Signed)
Case Management Note  Patient Details  [name] MRN: [id number] Date of Birth: March 25, 1928  Subjective/Objective:                 Spoke with patient at the bedside, he states that he would like to have [facility] for [facility] PT RN HHA, specifically Elder Cyphers, CM passed this on w/ referral to [facility]. Patient states that he has private duty care a few hours a day every day and they drive him to appointments, has a cane and a walker at home. Patient denies any difficulties paying for medications.    Action/Plan:  Referral made for [facility].  Expected Discharge Date:                  Expected Discharge Plan:  [place]  In-House Referral:     Discharge planning Services  CM Consult  Post Acute Care Choice:  Home Health Choice offered to:  Patient  DME Arranged:    DME Agency:     HH Arranged:  RN, PT, Nurse's Aide [place] Agency:  [place]  Status of Service:  Completed, signed off  Medicare Important Message Given:    Date Medicare IM Given:    Medicare IM give by:    Date Additional Medicare IM Given:    Additional Medicare Important Message give by:     If discussed at [place] of Stay Meetings, dates discussed:    Additional Comments:  Carles Collet, RN 07/31/2015, 11:17 AM

## 2015-07-31 NOTE — Progress Notes (Signed)
Occupational Therapy Evaluation Patient Details [name] MRN: [id number] DOB: [birth year] Today's Date: 07/31/2015    History of Present Illness 82 y.o. male with PMH of CAD, s/p of CABG, aortic stenosis, s/p of bioprosthetic aortic valve replacement, chronic atrial fibrillation on Eliqis, GI bleed (EGD showed angiodysplasia which was ablated), hypertension, hyperlipidemia, diabetes mellitus, GERD, depression, BPH, bladder cancer, stroke, macular degeneration, diastolic congestive heart failure with EF 60-65%, CKD-III, who presented to the ED with dark stool.  He was admitted with dx of GI bleed.   Clinical Impression   PTA, pt lived alone and has a PCA to assist in the afternoons as needed. Pt not at baseline, but safe to D/C home with intermittent S and HHOT @ RW level. Anticipate D/C today. MD paged about need for [place].     Follow Up Recommendations  Home health OT;Supervision - Intermittent    Equipment Recommendations  None recommended by OT    Recommendations for Other Services       Precautions / Restrictions Precautions Precautions: Fall      Mobility Bed Mobility Overal bed mobility: Modified Independent                Transfers Overall transfer level: Needs assistance Equipment used: Rolling walker (2 wheeled) Transfers: Sit to/from Stand Sit to Stand: Supervision         General transfer comment: increased time. elevated surface    Balance Overall balance assessment: Needs assistance   Sitting balance-[name]: Good       Standing balance-[name]: Fair                              ADL Overall ADL's : Needs assistance/impaired     Grooming: Set up   Upper Body Bathing: Set up;Sitting   Lower Body Bathing: Min guard;Sit to/from stand   Upper Body Dressing : Set up;Sitting   Lower Body Dressing: Minimal assistance Lower Body Dressing Details (indicate cue type and reason): uses sock aid at home Toilet  Transfer: Supervision/safety;Ambulation;RW   Toileting- Clothing Manipulation and Hygiene: Modified independent       Functional mobility during ADLs: Supervision/safety;Rolling walker General ADL Comments: Pt states he does not feel he is at his basleine due to fatigue/weakenss  Recommended pt have S when bathing and use a shower chair.     Vision     Perception     Praxis      Pertinent Vitals/Pain Pain Assessment: No/denies pain     Hand Dominance Right   Extremity/Trunk Assessment Upper Extremity Assessment LUE Deficits / Details: L UE general weakness. Numb hand. Using funcitonally with difficulty at times.   Lower Extremity Assessment Lower Extremity Assessment: Generalized weakness   Cervical / Trunk Assessment Cervical / Trunk Assessment: Kyphotic   Communication Communication Communication: No difficulties   Cognition Arousal/Alertness: Awake/alert Behavior During Therapy: WFL for tasks assessed/performed Overall Cognitive Status: Within Functional Limits for tasks assessed                     General Comments       Exercises       Shoulder Instructions      Home Living Family/patient expects to be discharged to:: Private residence Living Arrangements: Alone Available Help at Discharge: Personal care attendant;Available PRN/intermittently Type of Home: House Home Access: Stairs to enter Entrance Stairs-Number of Steps: 3 Entrance Stairs-Rails: Left Home Layout: Two level;Bed/bath upstairs Alternate  Level Stairs-Number of Steps: flight (16) with landing (8+8) Alternate Level Stairs-Rails: Left Bathroom Shower/Tub: Tub/shower unit;Walk-in shower;Door;Curtain   Bathroom Toilet: Standard Bathroom Accessibility: Yes   Home Equipment: Cane - single point;Walker - 2 wheels;Shower seat;Hand held shower head   Additional Comments: Pt has grab bars for toilet and shower. Pt has walk in shower and tub/shower combo. He owns shower chair.         Prior Functioning/Environment Level of Independence: Needs assistance  Gait / Transfers Assistance Needed: Uses SPC for household ambulation vs furniture walking and RW for community ambulatin "so I don't fall." No falls reported.  ADL's / Homemaking Assistance Needed: Some assist with washing back, bathing and dressing from PCA.   Comments: son is MD; was a Insurance underwriter. PCA comes every afternoon for a few hours- takes him to appt and to exercise class twice/week. PCA assists as needed with bathing/dressing    OT Diagnosis: Generalized weakness   OT Problem List: Decreased strength;Decreased activity tolerance;Impaired UE functional use   OT Treatment/Interventions:      OT Goals(Current goals can be found in the care plan section) Acute Rehab OT Goals Patient Stated Goal: to return to exercise class and to go home OT Goal Formulation: All assessment and education complete, DC therapy  OT Frequency:     Barriers to D/C:            Co-evaluation              End of Session Equipment Utilized During Treatment: Gait belt;Rolling walker Nurse Communication: Mobility status  Activity Tolerance: Patient tolerated treatment well Patient left: in bed;with call bell/phone within reach   Time: [id number] OT Time Calculation (min): 14 min Charges:  OT General Charges $OT Visit: 1 Procedure OT Evaluation $OT Eval Moderate Complexity: 1 Procedure G-Codes:    Kiona Blume,HILLARY 2015/08/26, 10:00 AM  Maurie Boettcher, OTR/L  [phone] 2015/08/26

## 2015-08-01 ENCOUNTER — Other Ambulatory Visit: Payer: Self-pay | Admitting: Physical Medicine and Rehabilitation

## 2015-08-01 ENCOUNTER — Telehealth: Payer: Self-pay | Admitting: *Deleted

## 2015-08-01 DIAGNOSIS — T184XXA Foreign body in colon, initial encounter: Secondary | ICD-10-CM

## 2015-08-01 NOTE — Telephone Encounter (Signed)
Spoke with patient and he has not seen the capsule pass. He will come for xray this week.(Thursday)

## 2015-08-01 NOTE — Telephone Encounter (Signed)
-----   Message from Hulan Saas, RN sent at 07/25/2015  2:16 PM EST ----- Did patient pass capsule?([place])

## 2015-08-02 ENCOUNTER — Inpatient Hospital Stay: Payer: Medicare Other

## 2015-08-02 NOTE — Interval H&P Note (Signed)
History and Physical Interval Note:  08/02/2015 8:41 AM  [name]  has presented today for surgery, with the diagnosis of a fib  The various methods of treatment have been discussed with the patient and family. After consideration of risks, benefits and other options for treatment, the patient has consented to  Procedure(s): TRANSESOPHAGEAL ECHOCARDIOGRAM (TEE) (N/A) as a surgical intervention .  The patient's history has been reviewed, patient examined, no change in status, stable for surgery.  I have reviewed the patient's chart and labs.  Questions were answered to the patient's satisfaction.     Dorothy Spark

## 2015-08-02 NOTE — H&P (View-Only) (Signed)
[facility] PROGRESS NOTE    Progress Note   MELAKI ARGUMEDO [id number] DOB: [birth year] DOA: 07/28/2015 PCP: [name], MD   Brief Narrative:   [name] is an 82 y.o. male history of CAD status post CABG, aortic stenosis status post bioprosthetic valve replacement, chronic atrial fibrillation on Eluquis, CVA about a year ago, GI bleed (with an EGD that showed angiodysplasia which was ablated), he had an enteroscopy in January 2016 that showed 2 small nonbleeding AVMs, readmitted on 06/27/2015 for melena and anemia underwent colonoscopy, with a video capsule performed on 07/25/2015, hypertension history of bladder cancer, chronic diastolic heart failure and chronic kidney disease percent with dark tarry stools. Back in 07/11/2015 his Hbg was 9.1 on admission was 8.0.  Assessment/Plan:   GI bleed/melena/Acute blood loss anemia with a history of AVM's: No further bleeding. I appreciate GIs assistance. Mild drop in his hbg, will transfuse one unit of PRBC. Check a CBC post-transfusional. Will probably go home tomorrow.    Chronic atrial fibrillation: CHA2DS2-VASc Score is 7. Hold Eluquis, heart rate control continue Coreg. Will be of anticoagulation for 1-2 weeks will follow up with Cards as an outpatient.  Chronic kidney disease stage III: Creatinine at baseline.  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  History of Aortic stenosis, severe: No acute issues.  Chronic diastolic heart failure: Continue Coreg and Lasix.  Essential hypertension: Continue Coreg amlodipine and Imdur.  BPH: Continue Flomax.   DVT Prophylaxis - SCD's  Family Communication: none Disposition Plan: Home 1-2days Code Status:     Code Status Orders        Start     Ordered   07/28/15 1839  Full code   Continuous     07/28/15 1839    Code Status History    Date Active Date Inactive Code  Status Order ID Comments User Context   07/10/2015 11:37 PM 07/12/2015 11:15 PM Full Code [id number]  Rise Patience, MD Inpatient   04/12/2015  7:05 PM 04/17/2015  7:10 PM Full Code [id number]  Geradine Girt, DO Inpatient   06/29/2014 12:24 PM 07/09/2014  2:15 PM Full Code [id number]  Bary Leriche, PA-C Inpatient    Advance Directive Documentation        Most Recent Value   Type of Advance Directive  Living will   Pre-existing out of facility DNR order (yellow form or pink MOST form)     "MOST" Form in Place?          IV Access:    Peripheral IV   Procedures and diagnostic studies:   No results found.   Medical Consultants:    None.  Anti-Infectives:   Anti-infectives    Start     Dose/Rate Route Frequency Ordered Stop   07/28/15 2000  hydroxychloroquine (PLAQUENIL) tablet 200 mg     200 mg Oral Daily 07/28/15 1834        Subjective:    Wonda Cheng Westenberger no further melena in the hospital. Objective:    Filed Vitals:   07/29/15 1048 07/29/15 1241 07/29/15 2344 07/30/15 0543  BP: 146/54 133/58 141/56 152/67  Pulse:  68 70 71  Temp:  98 F (36.7 C) 99 F (37.2 C) 98.2 F (36.8 C)  TempSrc:  Oral Oral Oral  Resp:  18 16 18   Weight:    105.5 kg (232 lb 9.4 oz)  SpO2:  99% 99% 97%  Intake/Output Summary (Last 24 hours) at 07/30/15 1513 Last data filed at 07/30/15 1446  Gross per 24 hour  Intake    240 ml  Output   1200 ml  Net   -960 ml   Filed Weights   07/29/15 0640 07/30/15 0543  Weight: 105 kg (231 lb 7.7 oz) 105.5 kg (232 lb 9.4 oz)    Exam: Gen:  NAD Cardiovascular:  RRR. Chest and lungs:   CTAB Abdomen:  Abdomen soft, NT/ND, + BS Extremities:  No edema   Data Reviewed:    Labs: Basic Metabolic Panel:  Recent Labs Lab 07/28/15 1628  NA 137  K 4.6  CL 104  CO2 23  GLUCOSE 121*  BUN 44*  CREATININE 2.08*  CALCIUM 9.1   GFR Estimated Creatinine Clearance: 31 mL/min (by C-G formula based on Cr of 2.08). Liver Function  Tests:  Recent Labs Lab 07/28/15 1628  AST 24  ALT 16*  ALKPHOS 82  BILITOT 0.5  PROT 6.3*  ALBUMIN 3.3*   No results for input(s): LIPASE, AMYLASE in the last 168 hours. No results for input(s): AMMONIA in the last 168 hours. Coagulation profile  Recent Labs Lab 07/28/15 1628  INR 1.20    CBC:  Recent Labs Lab 07/28/15 2154 07/29/15 0005 07/29/15 0610 07/29/15 1812 07/30/15 0635  WBC 6.1 6.6 5.7 6.7 6.1  HGB 8.4* 8.0* 8.0* 8.3* 7.6*  HCT 26.7* 25.6* 25.4* 27.2* 23.9*  MCV 88.1 88.3 87.9 88.9 87.5  PLT 200 195 201 240 205   Cardiac Enzymes: No results for input(s): CKTOTAL, CKMB, CKMBINDEX, TROPONINI in the last 168 hours. BNP (last 3 results)  Recent Labs  07/10/15 1330  PROBNP 211.0*   CBG:  Recent Labs Lab 07/29/15 1153 07/29/15 1733 07/29/15 2045 07/30/15 0754 07/30/15 1154  GLUCAP 269* 413* 371* 346* 314*   D-Dimer: No results for input(s): DDIMER in the last 72 hours. Hgb A1c: No results for input(s): HGBA1C in the last 72 hours. Lipid Profile: No results for input(s): CHOL, HDL, LDLCALC, TRIG, CHOLHDL, LDLDIRECT in the last 72 hours. Thyroid function studies: No results for input(s): TSH, T4TOTAL, T3FREE, THYROIDAB in the last 72 hours.  Invalid input(s): FREET3 Anemia work up: No results for input(s): VITAMINB12, FOLATE, FERRITIN, TIBC, IRON, RETICCTPCT in the last 72 hours. Sepsis Labs:  Recent Labs Lab 07/29/15 0005 07/29/15 0610 07/29/15 1812 07/30/15 0635  WBC 6.6 5.7 6.7 6.1   Microbiology No results found for this or any previous visit (from the past 240 hour(s)).   Medications:   . sodium chloride  10 mL/hr Intravenous Once  . sodium chloride   Intravenous Once  . amLODipine  5 mg Oral Daily  . antiseptic oral rinse  7 mL Mouth Rinse BID  . [START ON 07/31/2015] calcitRIOL  0.25 mcg Oral Q M,W,F  . carvedilol  6.25 mg Oral BID  . cholecalciferol  1,000 Units Oral Daily  . escitalopram  10 mg Oral Daily  .  ferrous sulfate  325 mg Oral Q breakfast  . furosemide  20 mg Oral Daily  . hydroxychloroquine  200 mg Oral Daily  . insulin aspart  0-5 Units Subcutaneous QHS  . insulin aspart  0-9 Units Subcutaneous TID WC  . insulin aspart  3 Units Subcutaneous TID WC  . insulin glargine  25 Units Subcutaneous BID  . isosorbide mononitrate  30 mg Oral Daily  . multivitamin with minerals  1 tablet Oral Daily  . polyethylene glycol  17 g Oral  BID  . sodium chloride flush  3 mL Intravenous Q12H  . tamsulosin  0.4 mg Oral QPC supper  . vitamin C  500 mg Oral Daily   Continuous Infusions: . pantoprozole (PROTONIX) infusion 8 mg/hr (07/30/15 0306)    Time spent: 15 min   LOS: 2 days   Charlynne Cousins  [facility] Pager [phone]  *Please refer to [email or web address], password [email or web address] to get updated schedule on who will round on this patient, as hospitalists switch teams weekly. If 7PM-7AM, please contact night-coverage at [email or web address], password [email or web address] for any overnight needs.  07/30/2015, 3:13 PM

## 2015-08-02 NOTE — Telephone Encounter (Signed)
OK 

## 2015-08-03 DIAGNOSIS — N183 Chronic kidney disease, stage 3 (moderate): Secondary | ICD-10-CM | POA: Diagnosis not present

## 2015-08-03 DIAGNOSIS — Z794 Long term (current) use of insulin: Secondary | ICD-10-CM | POA: Diagnosis not present

## 2015-08-03 DIAGNOSIS — Z8551 Personal history of malignant neoplasm of bladder: Secondary | ICD-10-CM | POA: Diagnosis not present

## 2015-08-03 DIAGNOSIS — E1142 Type 2 diabetes mellitus with diabetic polyneuropathy: Secondary | ICD-10-CM | POA: Diagnosis not present

## 2015-08-03 DIAGNOSIS — I5032 Chronic diastolic (congestive) heart failure: Secondary | ICD-10-CM | POA: Diagnosis not present

## 2015-08-03 DIAGNOSIS — I130 Hypertensive heart and chronic kidney disease with heart failure and stage 1 through stage 4 chronic kidney disease, or unspecified chronic kidney disease: Secondary | ICD-10-CM | POA: Diagnosis not present

## 2015-08-03 DIAGNOSIS — I482 Chronic atrial fibrillation: Secondary | ICD-10-CM | POA: Diagnosis not present

## 2015-08-03 DIAGNOSIS — Z8673 Personal history of transient ischemic attack (TIA), and cerebral infarction without residual deficits: Secondary | ICD-10-CM | POA: Diagnosis not present

## 2015-08-03 DIAGNOSIS — K219 Gastro-esophageal reflux disease without esophagitis: Secondary | ICD-10-CM | POA: Diagnosis not present

## 2015-08-03 DIAGNOSIS — E785 Hyperlipidemia, unspecified: Secondary | ICD-10-CM | POA: Diagnosis not present

## 2015-08-03 DIAGNOSIS — K922 Gastrointestinal hemorrhage, unspecified: Secondary | ICD-10-CM | POA: Diagnosis not present

## 2015-08-03 DIAGNOSIS — I2510 Atherosclerotic heart disease of native coronary artery without angina pectoris: Secondary | ICD-10-CM | POA: Diagnosis not present

## 2015-08-03 DIAGNOSIS — H3530 Unspecified macular degeneration: Secondary | ICD-10-CM | POA: Diagnosis not present

## 2015-08-03 DIAGNOSIS — D631 Anemia in chronic kidney disease: Secondary | ICD-10-CM | POA: Diagnosis not present

## 2015-08-03 DIAGNOSIS — Z951 Presence of aortocoronary bypass graft: Secondary | ICD-10-CM | POA: Diagnosis not present

## 2015-08-03 DIAGNOSIS — Z87891 Personal history of nicotine dependence: Secondary | ICD-10-CM | POA: Diagnosis not present

## 2015-08-03 DIAGNOSIS — E1122 Type 2 diabetes mellitus with diabetic chronic kidney disease: Secondary | ICD-10-CM | POA: Diagnosis not present

## 2015-08-03 DIAGNOSIS — Z95818 Presence of other cardiac implants and grafts: Secondary | ICD-10-CM | POA: Diagnosis not present

## 2015-08-04 ENCOUNTER — Encounter (HOSPITAL_COMMUNITY): Payer: Self-pay | Admitting: Cardiovascular Disease

## 2015-08-04 ENCOUNTER — Ambulatory Visit (HOSPITAL_COMMUNITY)
Admission: RE | Admit: 2015-08-04 | Discharge: 2015-08-04 | Disposition: A | Discharge status: Home / Self Care | Payer: Medicare Other | Source: Ambulatory Visit | Attending: Physician Assistant | Admitting: Physician Assistant

## 2015-08-04 ENCOUNTER — Encounter (HOSPITAL_COMMUNITY)
Admission: RE | Disposition: A | Discharge status: Home / Self Care | Payer: Self-pay | Source: Ambulatory Visit | Attending: Cardiology

## 2015-08-04 ENCOUNTER — Ambulatory Visit (HOSPITAL_BASED_OUTPATIENT_CLINIC_OR_DEPARTMENT_OTHER)
Admission: RE | Admit: 2015-08-04 | Discharge: 2015-08-04 | Disposition: A | Discharge status: Home / Self Care | Payer: Medicare Other | Source: Ambulatory Visit | Attending: Nurse Practitioner | Admitting: Nurse Practitioner

## 2015-08-04 ENCOUNTER — Ambulatory Visit (HOSPITAL_COMMUNITY)
Admission: RE | Admit: 2015-08-04 | Discharge: 2015-08-04 | Disposition: A | Discharge status: Home / Self Care | Payer: Medicare Other | Source: Ambulatory Visit | Attending: Cardiology | Admitting: Cardiology

## 2015-08-04 DIAGNOSIS — I482 Chronic atrial fibrillation: Secondary | ICD-10-CM | POA: Diagnosis not present

## 2015-08-04 DIAGNOSIS — E1122 Type 2 diabetes mellitus with diabetic chronic kidney disease: Secondary | ICD-10-CM | POA: Diagnosis not present

## 2015-08-04 DIAGNOSIS — Z951 Presence of aortocoronary bypass graft: Secondary | ICD-10-CM | POA: Insufficient documentation

## 2015-08-04 DIAGNOSIS — N183 Chronic kidney disease, stage 3 (moderate): Secondary | ICD-10-CM | POA: Diagnosis not present

## 2015-08-04 DIAGNOSIS — I129 Hypertensive chronic kidney disease with stage 1 through stage 4 chronic kidney disease, or unspecified chronic kidney disease: Secondary | ICD-10-CM | POA: Diagnosis not present

## 2015-08-04 DIAGNOSIS — Z8673 Personal history of transient ischemic attack (TIA), and cerebral infarction without residual deficits: Secondary | ICD-10-CM | POA: Diagnosis not present

## 2015-08-04 DIAGNOSIS — E785 Hyperlipidemia, unspecified: Secondary | ICD-10-CM | POA: Diagnosis not present

## 2015-08-04 DIAGNOSIS — E1121 Type 2 diabetes mellitus with diabetic nephropathy: Secondary | ICD-10-CM | POA: Insufficient documentation

## 2015-08-04 DIAGNOSIS — I340 Nonrheumatic mitral (valve) insufficiency: Secondary | ICD-10-CM

## 2015-08-04 DIAGNOSIS — Z880 Allergy status to penicillin: Secondary | ICD-10-CM | POA: Insufficient documentation

## 2015-08-04 DIAGNOSIS — Z955 Presence of coronary angioplasty implant and graft: Secondary | ICD-10-CM | POA: Insufficient documentation

## 2015-08-04 DIAGNOSIS — I5032 Chronic diastolic (congestive) heart failure: Secondary | ICD-10-CM | POA: Insufficient documentation

## 2015-08-04 DIAGNOSIS — D62 Acute posthemorrhagic anemia: Secondary | ICD-10-CM | POA: Diagnosis not present

## 2015-08-04 DIAGNOSIS — Z8551 Personal history of malignant neoplasm of bladder: Secondary | ICD-10-CM | POA: Diagnosis not present

## 2015-08-04 DIAGNOSIS — Z794 Long term (current) use of insulin: Secondary | ICD-10-CM | POA: Diagnosis not present

## 2015-08-04 DIAGNOSIS — I350 Nonrheumatic aortic (valve) stenosis: Secondary | ICD-10-CM | POA: Diagnosis not present

## 2015-08-04 DIAGNOSIS — T184XXA Foreign body in colon, initial encounter: Secondary | ICD-10-CM

## 2015-08-04 DIAGNOSIS — Z952 Presence of prosthetic heart valve: Secondary | ICD-10-CM | POA: Insufficient documentation

## 2015-08-04 DIAGNOSIS — I4819 Other persistent atrial fibrillation: Secondary | ICD-10-CM | POA: Insufficient documentation

## 2015-08-04 DIAGNOSIS — Z87891 Personal history of nicotine dependence: Secondary | ICD-10-CM | POA: Insufficient documentation

## 2015-08-04 DIAGNOSIS — I4891 Unspecified atrial fibrillation: Secondary | ICD-10-CM | POA: Diagnosis not present

## 2015-08-04 DIAGNOSIS — N400 Enlarged prostate without lower urinary tract symptoms: Secondary | ICD-10-CM | POA: Insufficient documentation

## 2015-08-04 DIAGNOSIS — I2510 Atherosclerotic heart disease of native coronary artery without angina pectoris: Secondary | ICD-10-CM | POA: Insufficient documentation

## 2015-08-04 DIAGNOSIS — R935 Abnormal findings on diagnostic imaging of other abdominal regions, including retroperitoneum: Secondary | ICD-10-CM | POA: Diagnosis not present

## 2015-08-04 HISTORY — PX: TEE WITHOUT CARDIOVERSION: SHX5443

## 2015-08-04 LAB — GLUCOSE, CAPILLARY: Glucose-Capillary: 98 mg/dL (ref 65–99)

## 2015-08-04 SURGERY — ECHOCARDIOGRAM, TRANSESOPHAGEAL
Anesthesia: Moderate Sedation

## 2015-08-04 MED ORDER — SODIUM CHLORIDE 0.9 % IV SOLN
INTRAVENOUS | Status: DC
  Administered 2015-08-04: 500 mL via INTRAVENOUS

## 2015-08-04 MED ORDER — LIDOCAINE VISCOUS 2 % MT SOLN
OROMUCOSAL | Status: AC
  Filled 2015-08-04: qty 15

## 2015-08-04 MED ORDER — FENTANYL CITRATE (PF) 100 MCG/2ML IJ SOLN
INTRAMUSCULAR | Status: AC
  Filled 2015-08-04: qty 2

## 2015-08-04 MED ORDER — BUTAMBEN-TETRACAINE-BENZOCAINE 2-2-14 % EX AERO
INHALATION_SPRAY | CUTANEOUS | Status: DC | PRN
  Administered 2015-08-04: 2 via TOPICAL

## 2015-08-04 MED ORDER — MIDAZOLAM HCL 10 MG/2ML IJ SOLN
INTRAMUSCULAR | Status: DC | PRN
  Administered 2015-08-04: 2 mg via INTRAVENOUS
  Administered 2015-08-04: 1 mg via INTRAVENOUS

## 2015-08-04 MED ORDER — MIDAZOLAM HCL 5 MG/ML IJ SOLN
INTRAMUSCULAR | Status: AC
  Filled 2015-08-04: qty 2

## 2015-08-04 MED ORDER — FENTANYL CITRATE (PF) 100 MCG/2ML IJ SOLN
INTRAMUSCULAR | Status: DC | PRN
  Administered 2015-08-04: 25 ug via INTRAVENOUS

## 2015-08-04 NOTE — Progress Notes (Signed)
  Echocardiogram Echocardiogram Transesophageal has been performed.  [name] 08/04/2015, 10:57 AM

## 2015-08-04 NOTE — Discharge Instructions (Signed)

## 2015-08-04 NOTE — Interval H&P Note (Signed)
History and Physical Interval Note:  08/04/2015 6:03 AM  [name]  has presented today for surgery, with the diagnosis of a fib  The various methods of treatment have been discussed with the patient and family. After consideration of risks, benefits and other options for treatment, the patient has consented to  Procedure(s): TRANSESOPHAGEAL ECHOCARDIOGRAM (TEE) (N/A) as a surgical intervention .  The patient's history has been reviewed, patient examined, no change in status, stable for surgery.  I have reviewed the patient's chart and labs.  Questions were answered to the patient's satisfaction.     Sharol Harness , MD

## 2015-08-04 NOTE — CV Procedure (Signed)
Brief TEE Note  Mr. Nasher tolerated moderate sedation without complication.  He was sedated for 20 minutes and received 3 mg of versed and 25 mcg of fentanyl  LVEF >55% Mild MR and TR. No LA or LAA thrombus or mass.   Aortic valve bioprosthesis well-seated and without thrombus or vegetation. Moderate mitral annular calcification with mild restriction of the posterior leaflet but mean gradient across the mitral valve is 2 mmHg (no MS).  For additional detail please see the full report.  Indalecio Malmstrom C. Oval Linsey, MD, [facility]  08/04/2015  10:49 AM
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
## 2015-08-06 ENCOUNTER — Telehealth: Payer: Self-pay | Admitting: Internal Medicine

## 2015-08-06 MED ORDER — ISOSORBIDE MONONITRATE ER 30 MG PO TB24: 30.0000 mg | ORAL_TABLET | Freq: Every day | ORAL | Status: DC

## 2015-08-06 NOTE — Telephone Encounter (Signed)
Imdur sent to pharmacy to resume.

## 2015-08-07 ENCOUNTER — Encounter (HOSPITAL_COMMUNITY): Payer: Self-pay | Admitting: Cardiovascular Disease

## 2015-08-07 DIAGNOSIS — E1142 Type 2 diabetes mellitus with diabetic polyneuropathy: Secondary | ICD-10-CM | POA: Diagnosis not present

## 2015-08-07 DIAGNOSIS — I130 Hypertensive heart and chronic kidney disease with heart failure and stage 1 through stage 4 chronic kidney disease, or unspecified chronic kidney disease: Secondary | ICD-10-CM | POA: Diagnosis not present

## 2015-08-07 DIAGNOSIS — E1122 Type 2 diabetes mellitus with diabetic chronic kidney disease: Secondary | ICD-10-CM | POA: Diagnosis not present

## 2015-08-07 DIAGNOSIS — N183 Chronic kidney disease, stage 3 (moderate): Secondary | ICD-10-CM | POA: Diagnosis not present

## 2015-08-07 DIAGNOSIS — I5032 Chronic diastolic (congestive) heart failure: Secondary | ICD-10-CM | POA: Diagnosis not present

## 2015-08-07 DIAGNOSIS — K922 Gastrointestinal hemorrhage, unspecified: Secondary | ICD-10-CM | POA: Diagnosis not present

## 2015-08-07 NOTE — Telephone Encounter (Signed)
Patient notified to resume Imdur.

## 2015-08-08 ENCOUNTER — Encounter: Payer: Medicare Other | Admitting: Internal Medicine

## 2015-08-08 ENCOUNTER — Telehealth: Payer: Self-pay

## 2015-08-08 ENCOUNTER — Encounter (HOSPITAL_COMMUNITY): Payer: Self-pay

## 2015-08-08 NOTE — Telephone Encounter (Signed)
Patient notified and voiced understanding.

## 2015-08-08 NOTE — Telephone Encounter (Signed)
Lab Results  Component Value Date   HGBA1C 6.9* 06/22/2015   Tell him to stop using the novolog  (short acting insulin) completely,  Only use lantus ,  And have his caregiver get him Premier Protein shakes to stock his refrigerator with so he does not skip any more meals.
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
## 2015-08-09 ENCOUNTER — Telehealth: Payer: Self-pay | Admitting: Internal Medicine

## 2015-08-09 DIAGNOSIS — I5032 Chronic diastolic (congestive) heart failure: Secondary | ICD-10-CM | POA: Diagnosis not present

## 2015-08-09 DIAGNOSIS — K922 Gastrointestinal hemorrhage, unspecified: Secondary | ICD-10-CM | POA: Diagnosis not present

## 2015-08-09 DIAGNOSIS — E1122 Type 2 diabetes mellitus with diabetic chronic kidney disease: Secondary | ICD-10-CM | POA: Diagnosis not present

## 2015-08-09 DIAGNOSIS — I130 Hypertensive heart and chronic kidney disease with heart failure and stage 1 through stage 4 chronic kidney disease, or unspecified chronic kidney disease: Secondary | ICD-10-CM | POA: Diagnosis not present

## 2015-08-09 DIAGNOSIS — E1142 Type 2 diabetes mellitus with diabetic polyneuropathy: Secondary | ICD-10-CM | POA: Diagnosis not present

## 2015-08-09 DIAGNOSIS — N183 Chronic kidney disease, stage 3 (moderate): Secondary | ICD-10-CM | POA: Diagnosis not present

## 2015-08-09 NOTE — Telephone Encounter (Signed)
Be there 8:00am.  NPO after midnight, Per Chanetta Marshall, NP no Insulin in am but okay to take other medications with a small sip of water.  Patient aware

## 2015-08-09 NOTE — Telephone Encounter (Signed)
NewMessage  Pt requested to speak w/ RN concerning the preparations and arrival time for surgery scheduled for tomorrow 08/10/15. Please call back and discuss.

## 2015-08-10 ENCOUNTER — Ambulatory Visit (HOSPITAL_COMMUNITY): Payer: Medicare Other | Admitting: Certified Registered"

## 2015-08-10 ENCOUNTER — Ambulatory Visit (HOSPITAL_COMMUNITY): Payer: Medicare Other

## 2015-08-10 ENCOUNTER — Encounter (HOSPITAL_COMMUNITY)
Admission: RE | Disposition: A | Discharge status: Home Health | Payer: Self-pay | Source: Ambulatory Visit | Attending: Cardiovascular Disease

## 2015-08-10 ENCOUNTER — Encounter (HOSPITAL_COMMUNITY): Payer: Self-pay | Admitting: Certified Registered"

## 2015-08-10 ENCOUNTER — Inpatient Hospital Stay (HOSPITAL_COMMUNITY)
Admission: RE | Admit: 2015-08-10 | Discharge: 2015-08-11 | DRG: 274 | Disposition: A | Discharge status: Home Health | Payer: Medicare Other | Source: Ambulatory Visit | Attending: Cardiovascular Disease | Admitting: Cardiovascular Disease

## 2015-08-10 DIAGNOSIS — I482 Chronic atrial fibrillation, unspecified: Secondary | ICD-10-CM | POA: Diagnosis present

## 2015-08-10 DIAGNOSIS — I130 Hypertensive heart and chronic kidney disease with heart failure and stage 1 through stage 4 chronic kidney disease, or unspecified chronic kidney disease: Secondary | ICD-10-CM | POA: Diagnosis present

## 2015-08-10 DIAGNOSIS — Z8551 Personal history of malignant neoplasm of bladder: Secondary | ICD-10-CM | POA: Diagnosis not present

## 2015-08-10 DIAGNOSIS — Z8042 Family history of malignant neoplasm of prostate: Secondary | ICD-10-CM | POA: Diagnosis not present

## 2015-08-10 DIAGNOSIS — I5032 Chronic diastolic (congestive) heart failure: Secondary | ICD-10-CM | POA: Diagnosis present

## 2015-08-10 DIAGNOSIS — Z794 Long term (current) use of insulin: Secondary | ICD-10-CM

## 2015-08-10 DIAGNOSIS — Z951 Presence of aortocoronary bypass graft: Secondary | ICD-10-CM

## 2015-08-10 DIAGNOSIS — N184 Chronic kidney disease, stage 4 (severe): Secondary | ICD-10-CM | POA: Diagnosis present

## 2015-08-10 DIAGNOSIS — Z954 Presence of other heart-valve replacement: Secondary | ICD-10-CM | POA: Diagnosis not present

## 2015-08-10 DIAGNOSIS — Z87891 Personal history of nicotine dependence: Secondary | ICD-10-CM | POA: Diagnosis not present

## 2015-08-10 DIAGNOSIS — I69341 Monoplegia of lower limb following cerebral infarction affecting right dominant side: Secondary | ICD-10-CM | POA: Diagnosis not present

## 2015-08-10 DIAGNOSIS — I2510 Atherosclerotic heart disease of native coronary artery without angina pectoris: Secondary | ICD-10-CM | POA: Diagnosis present

## 2015-08-10 DIAGNOSIS — I481 Persistent atrial fibrillation: Secondary | ICD-10-CM | POA: Diagnosis not present

## 2015-08-10 DIAGNOSIS — Z880 Allergy status to penicillin: Secondary | ICD-10-CM | POA: Diagnosis not present

## 2015-08-10 DIAGNOSIS — Z833 Family history of diabetes mellitus: Secondary | ICD-10-CM

## 2015-08-10 DIAGNOSIS — Z823 Family history of stroke: Secondary | ICD-10-CM

## 2015-08-10 DIAGNOSIS — D649 Anemia, unspecified: Secondary | ICD-10-CM | POA: Diagnosis not present

## 2015-08-10 DIAGNOSIS — Z888 Allergy status to other drugs, medicaments and biological substances status: Secondary | ICD-10-CM | POA: Diagnosis not present

## 2015-08-10 DIAGNOSIS — D500 Iron deficiency anemia secondary to blood loss (chronic): Secondary | ICD-10-CM | POA: Diagnosis present

## 2015-08-10 DIAGNOSIS — Z006 Encounter for examination for normal comparison and control in clinical research program: Secondary | ICD-10-CM | POA: Diagnosis not present

## 2015-08-10 DIAGNOSIS — E785 Hyperlipidemia, unspecified: Secondary | ICD-10-CM | POA: Diagnosis present

## 2015-08-10 DIAGNOSIS — I4891 Unspecified atrial fibrillation: Secondary | ICD-10-CM

## 2015-08-10 DIAGNOSIS — N400 Enlarged prostate without lower urinary tract symptoms: Secondary | ICD-10-CM | POA: Diagnosis present

## 2015-08-10 DIAGNOSIS — E1122 Type 2 diabetes mellitus with diabetic chronic kidney disease: Secondary | ICD-10-CM | POA: Diagnosis not present

## 2015-08-10 HISTORY — PX: LEFT ATRIAL APPENDAGE OCCLUSION: SHX173A

## 2015-08-10 LAB — BASIC METABOLIC PANEL
ANION GAP: 11 (ref 5–15)
BUN: 29 mg/dL — ABNORMAL HIGH (ref 6–20)
CALCIUM: 8.9 mg/dL (ref 8.9–10.3)
CO2: 22 mmol/L (ref 22–32)
Chloride: 101 mmol/L (ref 101–111)
Creatinine, Ser: 2.07 mg/dL — ABNORMAL HIGH (ref 0.61–1.24)
GFR, EST AFRICAN AMERICAN: 32 mL/min — AB (ref >60)
GFR, EST NON AFRICAN AMERICAN: 27 mL/min — AB (ref >60)
GLUCOSE: 154 mg/dL — AB (ref 65–99)
POTASSIUM: 4.9 mmol/L (ref 3.5–5.1)
Sodium: 134 mmol/L — ABNORMAL LOW (ref 135–145)

## 2015-08-10 LAB — CBC
HEMATOCRIT: 27.2 % — AB (ref 39.0–52.0)
Hemoglobin: 8.9 g/dL — ABNORMAL LOW (ref 13.0–17.0)
MCH: 28.9 pg (ref 26.0–34.0)
MCHC: 32.7 g/dL (ref 30.0–36.0)
MCV: 88.3 fL (ref 78.0–100.0)
PLATELETS: 183 10*3/uL (ref 150–400)
RBC: 3.08 MIL/uL — AB (ref 4.22–5.81)
RDW: 13.8 % (ref 11.5–15.5)
WBC: 5.6 10*3/uL (ref 4.0–10.5)

## 2015-08-10 LAB — GLUCOSE, CAPILLARY
GLUCOSE-CAPILLARY: 148 mg/dL — AB (ref 65–99)
GLUCOSE-CAPILLARY: 290 mg/dL — AB (ref 65–99)
Glucose-Capillary: 173 mg/dL — ABNORMAL HIGH (ref 65–99)
Glucose-Capillary: 253 mg/dL — ABNORMAL HIGH (ref 65–99)

## 2015-08-10 LAB — MRSA PCR SCREENING: MRSA by PCR: NEGATIVE

## 2015-08-10 LAB — POCT ACTIVATED CLOTTING TIME
ACTIVATED CLOTTING TIME: 219 s
Activated Clotting Time: 152 seconds

## 2015-08-10 SURGERY — LEFT ATRIAL APPENDAGE OCCLUSION
Anesthesia: General

## 2015-08-10 MED ORDER — TAMSULOSIN HCL 0.4 MG PO CAPS
0.4000 mg | ORAL_CAPSULE | Freq: Every day | ORAL | Status: DC
  Administered 2015-08-10: 0.4 mg via ORAL
  Filled 2015-08-10: qty 1

## 2015-08-10 MED ORDER — HYDROCODONE-ACETAMINOPHEN 5-325 MG PO TABS: 1.0000 | ORAL_TABLET | ORAL | Status: DC | PRN

## 2015-08-10 MED ORDER — HYDROXYCHLOROQUINE SULFATE 200 MG PO TABS
200.0000 mg | ORAL_TABLET | Freq: Every day | ORAL | Status: DC
  Administered 2015-08-10: 200 mg via ORAL
  Filled 2015-08-10 (×3): qty 1

## 2015-08-10 MED ORDER — SODIUM CHLORIDE 0.9 % IV SOLN
INTRAVENOUS | Status: DC
  Administered 2015-08-10: 09:00:00 via INTRAVENOUS

## 2015-08-10 MED ORDER — SODIUM CHLORIDE 0.45 % IV SOLN
INTRAVENOUS | Status: DC
  Administered 2015-08-10: 09:00:00 via INTRAVENOUS

## 2015-08-10 MED ORDER — IOHEXOL 350 MG/ML SOLN
INTRAVENOUS | Status: DC | PRN
  Administered 2015-08-10: 50 mL via INTRAVENOUS

## 2015-08-10 MED ORDER — ASPIRIN 81 MG PO CHEW
81.0000 mg | CHEWABLE_TABLET | Freq: Every day | ORAL | Status: DC
  Administered 2015-08-11: 81 mg via ORAL
  Filled 2015-08-10: qty 1

## 2015-08-10 MED ORDER — SODIUM CHLORIDE 0.9 % IV SOLN: 250.0000 mL | INTRAVENOUS | Status: DC | PRN

## 2015-08-10 MED ORDER — SODIUM CHLORIDE 0.9% FLUSH
3.0000 mL | Freq: Two times a day (BID) | INTRAVENOUS | Status: DC
  Administered 2015-08-10 (×2): 3 mL via INTRAVENOUS

## 2015-08-10 MED ORDER — ROCURONIUM BROMIDE 100 MG/10ML IV SOLN
INTRAVENOUS | Status: DC | PRN
  Administered 2015-08-10: 10 mg via INTRAVENOUS
  Administered 2015-08-10: 40 mg via INTRAVENOUS

## 2015-08-10 MED ORDER — WARFARIN SODIUM 5 MG PO TABS: 5.0000 mg | ORAL_TABLET | Freq: Once | ORAL | Status: DC

## 2015-08-10 MED ORDER — VANCOMYCIN HCL IN DEXTROSE 1-5 GM/200ML-% IV SOLN
1000.0000 mg | INTRAVENOUS | Status: AC
  Administered 2015-08-10: 1000 mg via INTRAVENOUS
  Filled 2015-08-10 (×2): qty 200

## 2015-08-10 MED ORDER — LIDOCAINE HCL (CARDIAC) 20 MG/ML IV SOLN
INTRAVENOUS | Status: DC | PRN
  Administered 2015-08-10: 90 mg via INTRAVENOUS

## 2015-08-10 MED ORDER — BUPIVACAINE HCL (PF) 0.25 % IJ SOLN
INTRAMUSCULAR | Status: AC
  Filled 2015-08-10: qty 30

## 2015-08-10 MED ORDER — CARVEDILOL 6.25 MG PO TABS
6.2500 mg | ORAL_TABLET | Freq: Two times a day (BID) | ORAL | Status: DC
  Administered 2015-08-10 – 2015-08-11 (×3): 6.25 mg via ORAL
  Filled 2015-08-10 (×3): qty 1

## 2015-08-10 MED ORDER — PROPOFOL 10 MG/ML IV BOLUS
INTRAVENOUS | Status: DC | PRN
  Administered 2015-08-10: 60 mg via INTRAVENOUS
  Administered 2015-08-10: 100 mg via INTRAVENOUS

## 2015-08-10 MED ORDER — HEPARIN SODIUM (PORCINE) 1000 UNIT/ML IJ SOLN
INTRAMUSCULAR | Status: DC | PRN
  Administered 2015-08-10: 2000 [IU] via INTRAVENOUS

## 2015-08-10 MED ORDER — BUPIVACAINE HCL (PF) 0.25 % IJ SOLN
INTRAMUSCULAR | Status: DC | PRN
  Administered 2015-08-10: 10 mL

## 2015-08-10 MED ORDER — AMLODIPINE BESYLATE 5 MG PO TABS
5.0000 mg | ORAL_TABLET | Freq: Every day | ORAL | Status: DC
  Administered 2015-08-10 – 2015-08-11 (×2): 5 mg via ORAL
  Filled 2015-08-10 (×2): qty 1

## 2015-08-10 MED ORDER — FUROSEMIDE 20 MG PO TABS
20.0000 mg | ORAL_TABLET | Freq: Every day | ORAL | Status: DC
  Administered 2015-08-11: 20 mg via ORAL
  Filled 2015-08-10: qty 1

## 2015-08-10 MED ORDER — APIXABAN 2.5 MG PO TABS
2.5000 mg | ORAL_TABLET | Freq: Two times a day (BID) | ORAL | Status: DC
  Administered 2015-08-10 – 2015-08-11 (×2): 2.5 mg via ORAL
  Filled 2015-08-10 (×2): qty 1

## 2015-08-10 MED ORDER — FERROUS SULFATE 325 (65 FE) MG PO TABS: 325.0000 mg | ORAL_TABLET | Freq: Every day | ORAL | Status: DC

## 2015-08-10 MED ORDER — HEPARIN (PORCINE) IN NACL 2-0.9 UNIT/ML-% IJ SOLN
INTRAMUSCULAR | Status: AC
  Filled 2015-08-10: qty 500

## 2015-08-10 MED ORDER — SODIUM CHLORIDE 0.9 % IV SOLN
INTRAVENOUS | Status: AC
  Administered 2015-08-10: 100 mL/h via INTRAVENOUS

## 2015-08-10 MED ORDER — WARFARIN - PHYSICIAN DOSING INPATIENT: Freq: Every day | Status: DC

## 2015-08-10 MED ORDER — CALCITRIOL 0.25 MCG PO CAPS
0.2500 ug | ORAL_CAPSULE | ORAL | Status: DC
  Administered 2015-08-11: 0.25 ug via ORAL
  Filled 2015-08-10: qty 1

## 2015-08-10 MED ORDER — DOCUSATE SODIUM 100 MG PO CAPS
100.0000 mg | ORAL_CAPSULE | Freq: Every day | ORAL | Status: DC
  Administered 2015-08-10 – 2015-08-11 (×2): 100 mg via ORAL
  Filled 2015-08-10 (×2): qty 1

## 2015-08-10 MED ORDER — INSULIN ASPART 100 UNIT/ML ~~LOC~~ SOLN
0.0000 [IU] | Freq: Every day | SUBCUTANEOUS | Status: DC
  Administered 2015-08-10: 3 [IU] via SUBCUTANEOUS

## 2015-08-10 MED ORDER — PROTAMINE SULFATE 10 MG/ML IV SOLN
INTRAVENOUS | Status: DC | PRN
  Administered 2015-08-10 (×3): 10 mg via INTRAVENOUS

## 2015-08-10 MED ORDER — SODIUM CHLORIDE 0.9% FLUSH: 3.0000 mL | INTRAVENOUS | Status: DC | PRN

## 2015-08-10 MED ORDER — ISOSORBIDE MONONITRATE ER 30 MG PO TB24
30.0000 mg | ORAL_TABLET | Freq: Every day | ORAL | Status: DC
  Administered 2015-08-10 – 2015-08-11 (×2): 30 mg via ORAL
  Filled 2015-08-10 (×2): qty 1

## 2015-08-10 MED ORDER — PANTOPRAZOLE SODIUM 40 MG PO TBEC
40.0000 mg | DELAYED_RELEASE_TABLET | Freq: Every day | ORAL | Status: DC
  Administered 2015-08-10 – 2015-08-11 (×2): 40 mg via ORAL

## 2015-08-10 MED ORDER — ONDANSETRON HCL 4 MG/2ML IJ SOLN
INTRAMUSCULAR | Status: DC | PRN
  Administered 2015-08-10: 4 mg via INTRAVENOUS

## 2015-08-10 MED ORDER — GLYCOPYRROLATE 0.2 MG/ML IJ SOLN
INTRAMUSCULAR | Status: DC | PRN
  Administered 2015-08-10: .8 mg via INTRAVENOUS

## 2015-08-10 MED ORDER — HEPARIN SODIUM (PORCINE) 1000 UNIT/ML IJ SOLN
INTRAMUSCULAR | Status: AC
  Filled 2015-08-10: qty 1

## 2015-08-10 MED ORDER — HEPARIN (PORCINE) IN NACL 2-0.9 UNIT/ML-% IJ SOLN
INTRAMUSCULAR | Status: DC | PRN
  Administered 2015-08-10: 11:00:00

## 2015-08-10 MED ORDER — ADULT MULTIVITAMIN W/MINERALS CH
1.0000 | ORAL_TABLET | Freq: Every day | ORAL | Status: DC
  Administered 2015-08-10 – 2015-08-11 (×2): 1 via ORAL
  Filled 2015-08-10 (×2): qty 1

## 2015-08-10 MED ORDER — HEPARIN SODIUM (PORCINE) 1000 UNIT/ML IJ SOLN
INTRAMUSCULAR | Status: DC | PRN
  Administered 2015-08-10: 10000 [IU] via INTRAVENOUS

## 2015-08-10 MED ORDER — ONDANSETRON HCL 4 MG/2ML IJ SOLN: 4.0000 mg | Freq: Four times a day (QID) | INTRAMUSCULAR | Status: DC | PRN

## 2015-08-10 MED ORDER — INSULIN ASPART 100 UNIT/ML ~~LOC~~ SOLN
0.0000 [IU] | Freq: Three times a day (TID) | SUBCUTANEOUS | Status: DC
  Administered 2015-08-10: 8 [IU] via SUBCUTANEOUS
  Administered 2015-08-11: 15 [IU] via SUBCUTANEOUS

## 2015-08-10 MED ORDER — SODIUM CHLORIDE 0.9 % IV BOLUS (SEPSIS)
300.0000 mL | Freq: Once | INTRAVENOUS | Status: AC
  Administered 2015-08-10: 300 mL via INTRAVENOUS

## 2015-08-10 MED ORDER — FENTANYL CITRATE (PF) 250 MCG/5ML IJ SOLN
INTRAMUSCULAR | Status: DC | PRN
  Administered 2015-08-10: 50 ug via INTRAVENOUS

## 2015-08-10 MED ORDER — NEOSTIGMINE METHYLSULFATE 10 MG/10ML IV SOLN
INTRAVENOUS | Status: DC | PRN
  Administered 2015-08-10: 5 mg via INTRAVENOUS

## 2015-08-10 MED ORDER — PANTOPRAZOLE SODIUM 40 MG PO TBEC
40.0000 mg | DELAYED_RELEASE_TABLET | Freq: Every day | ORAL | Status: DC
  Filled 2015-08-10 (×2): qty 1

## 2015-08-10 MED ORDER — ESCITALOPRAM OXALATE 10 MG PO TABS
10.0000 mg | ORAL_TABLET | Freq: Every day | ORAL | Status: DC
  Administered 2015-08-10 – 2015-08-11 (×2): 10 mg via ORAL
  Filled 2015-08-10 (×2): qty 1

## 2015-08-10 MED ORDER — ACETAMINOPHEN 325 MG PO TABS
650.0000 mg | ORAL_TABLET | ORAL | Status: DC | PRN
  Administered 2015-08-10 (×2): 650 mg via ORAL
  Filled 2015-08-10 (×2): qty 2

## 2015-08-10 SURGICAL SUPPLY — 15 items
BLANKET WARM UNDERBOD FULL ACC (MISCELLANEOUS) ×3 IMPLANT
CATH DIAG 6FR PIGTAIL (CATHETERS) ×3 IMPLANT
KIT HEART LEFT (KITS) ×3 IMPLANT
NEEDLE TRANSEP BRK 71CM 407200 (NEEDLE) ×3 IMPLANT
PACK CARDIAC CATHETERIZATION (CUSTOM PROCEDURE TRAY) ×3 IMPLANT
PAD DEFIB LIFELINK (PAD) ×3 IMPLANT
SHEATH INTRO CHECKFLO 16F 13 (SHEATH) ×1 IMPLANT
SHEATH INTRO CHECKFLO 16F 13CM (SHEATH) ×2
SHEATH PINNACLE 8F 10CM (SHEATH) ×3 IMPLANT
SHEATH SWARTZ TS SL2 63CM 8.5F (SHEATH) ×3 IMPLANT
SHIELD RADPAD SCOOP 12X17 (MISCELLANEOUS) ×3 IMPLANT
TRANSDUCER W/STOPCOCK (MISCELLANEOUS) ×3 IMPLANT
WATCHMAN ACCESS ANTERIOR CURVE (SHEATH) ×3 IMPLANT
WATCHMAN CLOSURE 30MM (Prosthesis & Implant Heart) ×3 IMPLANT
WIRE AMPLATZ WHISKJ .035X260CM (WIRE) ×3 IMPLANT

## 2015-08-10 NOTE — Progress Notes (Signed)
Utilization Review Completed.Donne Anon T3/07/2015

## 2015-08-10 NOTE — Discharge Summary (Addendum)
ELECTROPHYSIOLOGY PROCEDURE DISCHARGE SUMMARY    Patient ID: [name],  MRN: [id number], DOB/AGE: [birth year] 82 y.o.  Admit date: 08/10/2015 Discharge date: 08/11/2015  Primary Care Physician: [name], MD Primary Cardiologist: Rockey Situ Electrophysiologist: Thompson Grayer, MD  Primary Discharge Diagnosis:  Permanent atrial fibrillation status post LAA occluder insertion this admission  Secondary Discharge Diagnosis:  1.  Prior GI bleed/acute on chronic blood loss anemia/Fe-deficiency anemia 2.  Prior AVR 3.  CAD s/p CABG 4.  HTN 5.  Aortic stenosis 6.  Hyperlipidemia 7.  Prior CVA 8.  Chronic diastolic heart failure 9.  CKD Stage IV (eGFR 27)  Procedures This Admission:  1.  Insertion of left atrial appendage occluder (Watchman) on 08/10/15 by Dr Rayann Heman and Dr Burt Knack.  This study demonstrated successful implantation of Watchman LAA occluder with no early apparent complications. TEE at time of procedure demonstrated no leak around device.   Brief HPI: [name] is a 82 y.o. male with a history of permanent atrial fibrillation.  They are felt to not be a candidate for long term Warfarin due to prior GI bleed. Risks, benefits, and alternatives to Watchman implant were reviewed with the patient who wished to proceed.  The patient underwent TEE prior to the procedure which demonstrated appendage suitable for attempt at [facility] placement.    Hospital Course:  The patient was admitted and underwent Watchman insertion with details as outlined above.  They were monitored on telemetry overnight which demonstrated rate controlled AF.  Groin was without complication on the day of discharge.  The patient was examined and considered to be stable for discharge. Wound care and restrictions were reviewed with the patient.   This patients CHA2DS2-VASc Score and unadjusted Ischemic Stroke Rate (% per year) is equal to 11.2 % stroke rate/year from a score of 7 Above score calculated  as 1 point each if present [CHF, HTN, DM, Vascular=MI/PAD/Aortic Plaque, Age if 65-74, or Male] Above score calculated as 2 points each if present [Age > 75, or Stroke/TIA/TE]  The patient will be resumed on Eliquis 2.71m twice daily.  Dr ARayann Hemanand Dr CBurt Knackdiscussed Aspirin and with significant GI bleed and AVM's recommend no ASA at this time.  He will need BMET on Monday with his PCP (pt aware).  Will also need weekly CBCs. The patient will be seen by APP in 1 week for groin check, monitor for s/s of bleeding, CBC.  45 day TEE will also be scheduled at that time.   Physical Exam: Filed Vitals:   08/11/15 0300 08/11/15 0400 08/11/15 0500 08/11/15 0600  BP: 145/70 138/66 136/55 151/93  Pulse: 75 66 31 78  Temp:  98.4 F (36.9 C)    TempSrc:  Oral    Resp: _0 Height:      Weight:      SpO2: 93% 92% 92% 92%    GEN- The patient is elderly appearing, alert and oriented x 3 today.   HEENT: normocephalic, atraumatic; sclera clear, conjunctiva pink; hearing intact; oropharynx clear; neck supple Lungs- Clear to ausculation bilaterally, normal work of breathing.  No wheezes, rales, rhonchi Heart- Irregular rate and rhythm 2/6 systolic murmur at the LSB GI- soft, non-tender, non-distended, bowel sounds present Extremities- no clubbing, cyanosis, or edema; DP/PT/radial pulses 1+ bilaterally, groin without hematoma/bruit MS- no significant deformity or atrophy Skin- warm and dry, no rash or lesion Psych- euthymic mood, full affect Neuro- strength and sensation are intact   Labs:  Lab Results  Component Value Date   WBC 5.6 08/10/2015   HGB 8.9* 08/10/2015   HCT 27.2* 08/10/2015   MCV 88.3 08/10/2015   PLT 183 08/10/2015     Recent Labs Lab 08/11/15 0556  NA 133*  K 5.0  CL 96*  CO2 24  BUN 30*  CREATININE 2.30*  CALCIUM 8.9  GLUCOSE 377*     Discharge Medications:    Medication List    TAKE these medications        amLODipine 5 MG tablet  Commonly  known as:  NORVASC  TAKE 1 TABLET EVERY DAY     apixaban 2.5 MG Tabs tablet  Commonly known as:  ELIQUIS  Take 1 tablet (2.5 mg total) by mouth 2 (two) times daily.     calcitRIOL 0.25 MCG capsule  Commonly known as:  ROCALTROL  Take 0.25 mcg by mouth every Monday, Wednesday, and Friday.     carvedilol 6.25 MG tablet  Commonly known as:  COREG  Take 6.25 mg by mouth 2 (two) times daily.     docusate sodium 100 MG capsule  Commonly known as:  COLACE  Take 100 mg by mouth daily.     escitalopram 10 MG tablet  Commonly known as:  LEXAPRO  Take 1 tablet (10 mg total) by mouth daily. With dinner     ferrous sulfate 325 (65 FE) MG tablet  Take 325 mg by mouth daily with breakfast.     furosemide 40 MG tablet  Commonly known as:  LASIX  TAKE ONE-HALF TABLET DAILY     hydroxychloroquine 200 MG tablet  Commonly known as:  PLAQUENIL  Take 200 mg by mouth daily.     insulin aspart 100 UNIT/ML injection  Commonly known as:  novoLOG  Inject 3-7 Units into the skin 3 (three) times daily as needed for high blood sugar (CBG >100). Per sliding scale: CBG 100-150 3 units, 151-200 4 units, 201-250 5 units, >250 7 units     insulin glargine 100 UNIT/ML injection  Commonly known as:  LANTUS  Inject 45 units into the skin twice a day     isosorbide mononitrate 30 MG 24 hr tablet  Commonly known as:  IMDUR  Take 1 tablet (30 mg total) by mouth daily.     multivitamin with minerals Tabs tablet  Take 1 tablet by mouth daily.     pantoprazole 40 MG tablet  Commonly known as:  PROTONIX  TAKE 1 TABLET EVERY DAY     PRESERVISION AREDS PO  Take 1 capsule by mouth 2 (two) times daily.     tamsulosin 0.4 MG Caps capsule  Commonly known as:  FLOMAX  TAKE ONE CAPSULE DAILY AFTER SUPPER     vitamin C 500 MG tablet  Commonly known as:  ASCORBIC ACID  Take 500 mg by mouth daily.     VITAMIN D PO  Take 1 tablet by mouth daily.        Disposition:  Discharge Instructions    Diet -  low sodium heart healthy    Complete by:  As directed      Discharge instructions    Complete by:  As directed   No lifting over 5 lbs for 1 week. No sexual activity for 1 week.  Keep procedure site clean & dry. If you notice increased pain, swelling, bleeding or pus, call/return!  You may shower, but no soaking baths/hot tubs/pools for 1 week.     Increase activity slowly  Complete by:  As directed           Follow-up Information    Follow up with [name], MD On 08/14/2015.   Specialty:  Internal Medicine   Why:  for lab work to check kidney function and blood counts   Contact information:   [place] [address] [phone]       Follow up with Thompson Grayer, MD On 08/21/2015.   Specialty:  Cardiology   Why:  at [facility] information:   [place] [address] [phone]       Duration of Discharge Encounter: Greater than 30 minutes including physician time.  Fidel Levy, NP 08/11/2015 7:48 AM  Patient seen, examined. Available data reviewed. Agree with findings, assessment, and plan as outlined by Chanetta Marshall, NP. The patient is doing well this morning. His right groin site is clear. There is no oral edema. Heart is regular rate and rhythm with a 2/6 systolic murmur at the left sternal border. All data reviewed. As above, recommend a metabolic panel on Monday to evaluate for contrast nephropathy. Otherwise patient will be started back on his home medications. Would use Eliquis alone because of his high bleeding risk.  Sherren Mocha, M.D. 08/11/2015 7:51 AM

## 2015-08-10 NOTE — Anesthesia Preprocedure Evaluation (Addendum)
Anesthesia Evaluation  Patient identified by MRN, date of birth, ID band Patient awake    Reviewed: Allergy & Precautions, NPO status , Patient's Chart, lab work & pertinent test results  History of Anesthesia Complications Negative for: history of anesthetic complications  Airway Mallampati: II  TM Distance: >3 FB Neck ROM: Full    Dental no notable dental hx. (+) Edentulous Upper, Dental Advisory Given   Pulmonary neg shortness of breath, neg sleep apnea, neg COPD, neg recent URI, former smoker,    breath sounds clear to auscultation       Cardiovascular hypertension, (-) angina+ CAD and + CABG  (-) Past MI and (-) CHF (-) dysrhythmias + Valvular Problems/Murmurs  Rhythm:Irregular Rate:Normal + Systolic murmurs S/P AVR bioprosthesis:    *[place]* *[facility]* [address], [place] [address] [phone]  ------------------------------------------------------------------- Transesophageal Echocardiography  Patient: [name], [name] MR #: [id number] Study Date: 08/04/2015 Gender: M Age: 82 Height: 177.8 cm Weight: 104.3 kg BSA: 2.3 m^2 Pt. Status: Room:  ADMITTING Ena Dawley, M.D. SONOGRAPHER [facility] PERFORMING Skeet Latch, MD ATTENDING Lynnell Jude, Amber K Emelia Loron, Amber K  cc:  ------------------------------------------------------------------- LV EF: 55% - 60%  ------------------------------------------------------------------- Indications: Atrial fibrillation - 427.31.  ------------------------------------------------------------------- History: PMH: AV Block. S/P AVR. Bradycardia. Coronary artery disease. Aortic valve disease. Stroke. Risk factors: Hypertension. Diabetes mellitus.  ------------------------------------------------------------------- Study Conclusions  - Left ventricle: Systolic function was normal. The estimated ejection fraction was in the  range of 55% to 60%. Wall motion was normal; there were no regional wall motion abnormalities. - Aortic valve: A bioprosthesis was present. The sewing ring appeared normal, had no rocking motion, and showed no evidence of dehiscence. - Mitral valve: Mildly calcified annulus. Mobility of the posterior leaflet was mildly restricted. There was mild regurgitation. - Left atrium: 0 degrees: width 2.16 cm; depth 3.29 cm 46 degrees: width 1.83 cm; depth 3.29 cm 90 degrees: width 1.87 cm; depth 3.44 cm 135 degrees: width 2.02 cm; depth 2.76 cm  Morphology: Windsock. No evidence of thrombus in the atrial cavity or appendage. No evidence of thrombus in the atrial cavity or appendage. - Right ventricle: The cavity size was normal. Wall thickness was normal. Systolic function was normal. - Right atrium: No evidence of thrombus in the atrial cavity or appendage. - Atrial septum: No defect or patent foramen ovale was identified by color flow Doppler. - Tricuspid valve: There was mild regurgitation.  Diagnostic transesophageal echocardiography. 2D and color Doppler. Birthdate: Patient birthdate: Nov 26, 1927. Age: Patient is 82 yr old. Sex: Gender: male. BMI: 33 kg/m^2. Blood pressure: 156/54 Patient status: Outpatient. Study date: Study date: 08/04/2015. Study time: 10:05 AM. Location: Endoscopy.  -------------------------------------------------------------------  ------------------------------------------------------------------- Left ventricle: Systolic function was normal. The estimated ejection fraction was in the range of 55% to 60%. Wall motion was normal; there were no regional wall motion abnormalities.  ------------------------------------------------------------------- Aortic valve: A bioprosthesis was present. The sewing ring appeared normal, had no rocking motion, and showed no evidence of dehiscence. Cusp separation was normal. Doppler: There was no significant  regurgitation.  ------------------------------------------------------------------- Aorta: There was no atheroma. There was no evidence for dissection. Aortic root: The aortic root was not dilated. Ascending aorta: The ascending aorta was normal in size. Aortic arch: The aortic arch was normal in size. Descending aorta: The descending aorta was normal in size.  ------------------------------------------------------------------- Mitral valve: Mildly calcified annulus. Leaflet separation was normal. Mobility of the posterior leaflet was mildly restricted. Doppler: There was no evidence for  stenosis. There was mild regurgitation. Valve area by pressure half-time: 3.44 cm^2. Indexed valve area by pressure half-time: 1.49 cm^2/m^2. Mean gradient (D): 2 mm Hg. Peak gradient (D): 7 mm Hg.     Neuro/Psych  Neuromuscular disease    GI/Hepatic negative GI ROS, Neg liver ROS,   Endo/Other  diabetes, Type 2  Renal/GU CRFRenal disease     Musculoskeletal  (+) Arthritis ,   Abdominal (+) + obese,   Peds  Hematology  (+) anemia ,   Anesthesia Other Findings   Reproductive/Obstetrics                         Anesthesia Physical Anesthesia Plan  ASA: IV  Anesthesia Plan: General   Post-op Pain Management:    Induction: Intravenous  Airway Management Planned: Oral ETT  Additional Equipment: Arterial line and TEE  Intra-op Plan:   Post-operative Plan: Extubation in OR and Possible Post-op intubation/ventilation  Informed Consent: I have reviewed the patients History and Physical, chart, labs and discussed the procedure including the risks, benefits and alternatives for the proposed anesthesia with the patient or authorized representative who has indicated his/her understanding and acceptance.     Plan Discussed with:   Anesthesia Plan Comments:         Anesthesia Quick Evaluation

## 2015-08-10 NOTE — Interval H&P Note (Signed)
History and Physical Interval Note:  08/10/2015 9:34 AM  [name]  has presented today for surgery, with the diagnosis of afib  The various methods of treatment have been discussed with the patient and family. After consideration of risks, benefits and other options for treatment, the patient has consented to  Procedure(s): LEFT ATRIAL APPENDAGE OCCLUSION (N/A) as a surgical intervention .  The patient's history has been reviewed, patient examined, no change in status, stable for surgery.  I have reviewed the patient's chart and labs.  Questions were answered to the patient's satisfaction.     Sherren Mocha

## 2015-08-10 NOTE — Progress Notes (Addendum)
Site area: Right groin a 16 french venous sheath was removed   Site Prior to Removal:  Level 0  Pressure Applied For 30 MINUTES    Minutes Beginning at 1155am  Manual:   Yes.    Patient Status During Pull:  stable  Post Pull Groin Site:  Level 0  Post Pull Instructions Given:  Yes.    Post Pull Pulses Present:  Yes.    Dressing Applied:  Yes.    Comments:  VS remain stable

## 2015-08-10 NOTE — Progress Notes (Signed)
  Echocardiogram Echocardiogram Transesophageal has been performed.  Johny Chess 08/10/2015, 1:22 PM

## 2015-08-10 NOTE — Discharge Instructions (Signed)

## 2015-08-10 NOTE — Transfer of Care (Signed)
Immediate Anesthesia Transfer of Care Note  Patient: [name]  Procedure(s) Performed: Procedure(s): LEFT ATRIAL APPENDAGE OCCLUSION (N/A)  Patient Location: Cath Lab  Anesthesia Type:General  Level of Consciousness: awake, alert  and oriented  Airway & Oxygen Therapy: Patient Spontanous Breathing and Patient connected to nasal cannula oxygen  Post-op Assessment: Report given to RN  Post vital signs: Reviewed and stable  Last Vitals:  Filed Vitals:   08/10/15 0747  BP: 149/64  Pulse: 35  Temp: 36.5 C  Resp: 16    Complications: No apparent anesthesia complications

## 2015-08-10 NOTE — Progress Notes (Signed)
[facility]  Patient Status: Active (receiving services up to time of hospitalization)  [facility] is providing the following services: PT  If patient discharges after hours, please call [phone].   [name] 08/10/2015, 1:45 PM
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
## 2015-08-10 NOTE — Anesthesia Procedure Notes (Signed)
Procedure Name: Intubation Date/Time: 08/10/2015 10:02 AM Performed by: Sampson Si E Pre-anesthesia Checklist: Patient identified, Emergency Drugs available, Suction available, Patient being monitored and Timeout performed Patient Re-evaluated:Patient Re-evaluated prior to inductionOxygen Delivery Method: Circle system utilized Preoxygenation: Pre-oxygenation with 100% oxygen Intubation Type: IV induction Ventilation: Mask ventilation without difficulty and Oral airway inserted - appropriate to patient size Laryngoscope Size: Mac and 3 Grade View: Grade I Tube type: Oral Tube size: 7.5 mm Number of attempts: 1 Airway Equipment and Method: Stylet Placement Confirmation: ETT inserted through vocal cords under direct vision,  positive ETCO2 and breath sounds checked- equal and bilateral Secured at: 22 cm Tube secured with: Tape Dental Injury: Teeth and Oropharynx as per pre-operative assessment

## 2015-08-11 ENCOUNTER — Telehealth: Payer: Self-pay | Admitting: *Deleted

## 2015-08-11 DIAGNOSIS — D500 Iron deficiency anemia secondary to blood loss (chronic): Secondary | ICD-10-CM

## 2015-08-11 DIAGNOSIS — N179 Acute kidney failure, unspecified: Secondary | ICD-10-CM

## 2015-08-11 DIAGNOSIS — I481 Persistent atrial fibrillation: Secondary | ICD-10-CM

## 2015-08-11 LAB — GLUCOSE, CAPILLARY
GLUCOSE-CAPILLARY: 493 mg/dL — AB (ref 65–99)
Glucose-Capillary: 491 mg/dL — ABNORMAL HIGH (ref 65–99)
Glucose-Capillary: 448 mg/dL — ABNORMAL HIGH (ref 65–99)

## 2015-08-11 LAB — TYPE AND SCREEN
ABO/RH(D): O POS
ANTIBODY SCREEN: NEGATIVE

## 2015-08-11 LAB — BASIC METABOLIC PANEL
Anion gap: 13 (ref 5–15)
BUN: 30 mg/dL — AB (ref 6–20)
CALCIUM: 8.9 mg/dL (ref 8.9–10.3)
CO2: 24 mmol/L (ref 22–32)
CREATININE: 2.3 mg/dL — AB (ref 0.61–1.24)
Chloride: 96 mmol/L — ABNORMAL LOW (ref 101–111)
GFR, EST AFRICAN AMERICAN: 28 mL/min — AB (ref >60)
GFR, EST NON AFRICAN AMERICAN: 24 mL/min — AB (ref >60)
Glucose, Bld: 377 mg/dL — ABNORMAL HIGH (ref 65–99)
Potassium: 5 mmol/L (ref 3.5–5.1)
SODIUM: 133 mmol/L — AB (ref 135–145)

## 2015-08-11 MED ORDER — APIXABAN 2.5 MG PO TABS: 2.5000 mg | ORAL_TABLET | Freq: Two times a day (BID) | ORAL | Status: DC

## 2015-08-11 MED ORDER — INSULIN GLARGINE 100 UNIT/ML ~~LOC~~ SOLN
50.0000 [IU] | Freq: Once | SUBCUTANEOUS | Status: AC
  Administered 2015-08-11: 50 [IU] via SUBCUTANEOUS
  Filled 2015-08-11: qty 0.5

## 2015-08-11 NOTE — Telephone Encounter (Signed)
[name] cardiologist would like for his hemoglobin to be checked following d/c from the hospital. Please advise, thanks

## 2015-08-11 NOTE — Progress Notes (Signed)
Pt morning CBG 491, Dr Burt Knack notified, pt did not take his lantus yesterday.  Will give morning lantus 50units and novolog 15 units, per orders and recheck CBG before discharge

## 2015-08-11 NOTE — Care Management Note (Signed)
Case Management Note  Patient Details  [name] MRN: [id number] Date of Birth: [birth year]  Subjective/Objective:        Adm w watchman procedure            Action/Plan: lives at home, act w ahc   Expected Discharge Date:                  Expected Discharge Plan:  [place]  In-House Referral:     Discharge planning Services  CM Consult  Post Acute Care Choice:  Resumption of Svcs/PTA Provider Choice offered to:     DME Arranged:    DME Agency:     HH Arranged:  PT [place]:  [place]  Status of Service:     Medicare Important Message Given:    Date Medicare IM Given:    Medicare IM give by:    Date Additional Medicare IM Given:    Additional Medicare Important Message give by:     If discussed at [place] of Stay Meetings, dates discussed:    Additional Comments: donna w ahc aware pt for dc and will resume hhpt  Lacretia Leigh, RN 08/11/2015, 10:21 AM

## 2015-08-11 NOTE — Telephone Encounter (Signed)
Patient was discharged from [facility] 08/11/15. His cardiologist requested that he have hemoglobin checked. Please advise if it would be appropriate to schedule a lab appt.  Pt Contact [phone]

## 2015-08-12 DIAGNOSIS — E1122 Type 2 diabetes mellitus with diabetic chronic kidney disease: Secondary | ICD-10-CM | POA: Diagnosis not present

## 2015-08-12 DIAGNOSIS — K922 Gastrointestinal hemorrhage, unspecified: Secondary | ICD-10-CM | POA: Diagnosis not present

## 2015-08-12 DIAGNOSIS — I130 Hypertensive heart and chronic kidney disease with heart failure and stage 1 through stage 4 chronic kidney disease, or unspecified chronic kidney disease: Secondary | ICD-10-CM | POA: Diagnosis not present

## 2015-08-12 DIAGNOSIS — E1142 Type 2 diabetes mellitus with diabetic polyneuropathy: Secondary | ICD-10-CM | POA: Diagnosis not present

## 2015-08-12 DIAGNOSIS — I5032 Chronic diastolic (congestive) heart failure: Secondary | ICD-10-CM | POA: Diagnosis not present

## 2015-08-12 DIAGNOSIS — N183 Chronic kidney disease, stage 3 (moderate): Secondary | ICD-10-CM | POA: Diagnosis not present

## 2015-08-12 NOTE — Telephone Encounter (Signed)
Labs ordered for Monday. Please call patient  to set up

## 2015-08-14 ENCOUNTER — Other Ambulatory Visit: Payer: Self-pay | Admitting: *Deleted

## 2015-08-14 ENCOUNTER — Other Ambulatory Visit (INDEPENDENT_AMBULATORY_CARE_PROVIDER_SITE_OTHER): Payer: Medicare Other

## 2015-08-14 DIAGNOSIS — E1122 Type 2 diabetes mellitus with diabetic chronic kidney disease: Secondary | ICD-10-CM | POA: Diagnosis not present

## 2015-08-14 DIAGNOSIS — D62 Acute posthemorrhagic anemia: Secondary | ICD-10-CM

## 2015-08-14 DIAGNOSIS — I5032 Chronic diastolic (congestive) heart failure: Secondary | ICD-10-CM | POA: Diagnosis not present

## 2015-08-14 DIAGNOSIS — N179 Acute kidney failure, unspecified: Secondary | ICD-10-CM

## 2015-08-14 DIAGNOSIS — I130 Hypertensive heart and chronic kidney disease with heart failure and stage 1 through stage 4 chronic kidney disease, or unspecified chronic kidney disease: Secondary | ICD-10-CM | POA: Diagnosis not present

## 2015-08-14 DIAGNOSIS — K922 Gastrointestinal hemorrhage, unspecified: Secondary | ICD-10-CM | POA: Diagnosis not present

## 2015-08-14 DIAGNOSIS — D500 Iron deficiency anemia secondary to blood loss (chronic): Secondary | ICD-10-CM

## 2015-08-14 DIAGNOSIS — N183 Chronic kidney disease, stage 3 (moderate): Secondary | ICD-10-CM | POA: Diagnosis not present

## 2015-08-14 DIAGNOSIS — E1142 Type 2 diabetes mellitus with diabetic polyneuropathy: Secondary | ICD-10-CM | POA: Diagnosis not present

## 2015-08-14 LAB — CBC WITH DIFFERENTIAL/PLATELET
BASOS ABS: 0.1 10*3/uL (ref 0.0–0.1)
Basophils Relative: 0.9 % (ref 0.0–3.0)
EOS ABS: 0.4 10*3/uL (ref 0.0–0.7)
Eosinophils Relative: 6.4 % — ABNORMAL HIGH (ref 0.0–5.0)
HEMATOCRIT: 25 % — AB (ref 39.0–52.0)
Hemoglobin: 8.2 g/dL — ABNORMAL LOW (ref 13.0–17.0)
LYMPHS PCT: 11 % — AB (ref 12.0–46.0)
Lymphs Abs: 0.7 10*3/uL (ref 0.7–4.0)
MCHC: 32.7 g/dL (ref 30.0–36.0)
MCV: 87.7 fl (ref 78.0–100.0)
MONO ABS: 0.6 10*3/uL (ref 0.1–1.0)
Monocytes Relative: 10.4 % (ref 3.0–12.0)
NEUTROS ABS: 4.4 10*3/uL (ref 1.4–7.7)
NEUTROS PCT: 71.3 % (ref 43.0–77.0)
PLATELETS: 230 10*3/uL (ref 150.0–400.0)
RBC: 2.85 Mil/uL — ABNORMAL LOW (ref 4.22–5.81)
RDW: 14.4 % (ref 11.5–15.5)
WBC: 6.2 10*3/uL (ref 4.0–10.5)

## 2015-08-14 LAB — BASIC METABOLIC PANEL
BUN: 39 mg/dL — AB (ref 6–23)
CALCIUM: 8.7 mg/dL (ref 8.4–10.5)
CO2: 25 meq/L (ref 19–32)
CREATININE: 1.99 mg/dL — AB (ref 0.40–1.50)
Chloride: 100 mEq/L (ref 96–112)
GFR: 34.03 mL/min — ABNORMAL LOW (ref >60.00)
Glucose, Bld: 143 mg/dL — ABNORMAL HIGH (ref 70–99)
Potassium: 5 mEq/L (ref 3.5–5.1)
Sodium: 132 mEq/L — ABNORMAL LOW (ref 135–145)

## 2015-08-14 NOTE — Telephone Encounter (Signed)
[name] will be coming in this afternoon for labs to be done

## 2015-08-14 NOTE — Addendum Note (Signed)
Addended by: Nanci Pina on: 08/14/2015 06:58 PM   Modules accepted: Orders

## 2015-08-15 NOTE — Anesthesia Postprocedure Evaluation (Signed)
Anesthesia Post Note  Patient: [name]  Procedure(s) Performed: Procedure(s) (LRB): LEFT ATRIAL APPENDAGE OCCLUSION (N/A)  Patient location during evaluation: PACU Anesthesia Type: General Level of consciousness: awake and alert Pain management: pain level controlled Vital Signs Assessment: post-procedure vital signs reviewed and stable Respiratory status: spontaneous breathing, nonlabored ventilation, respiratory function stable and patient connected to nasal cannula oxygen Cardiovascular status: blood pressure returned to baseline and stable Postop Assessment: no signs of nausea or vomiting Anesthetic complications: no    Last Vitals:  Filed Vitals:   08/11/15 0600 08/11/15 0822  BP: 151/93 135/54  Pulse: 78 82  Temp:  36.8 C  Resp: 19 27    Last Pain:  Filed Vitals:   08/11/15 0918  PainSc: 0-No pain                 Breyer Tejera,JAMES TERRILL

## 2015-08-16 DIAGNOSIS — E1122 Type 2 diabetes mellitus with diabetic chronic kidney disease: Secondary | ICD-10-CM | POA: Diagnosis not present

## 2015-08-16 DIAGNOSIS — I130 Hypertensive heart and chronic kidney disease with heart failure and stage 1 through stage 4 chronic kidney disease, or unspecified chronic kidney disease: Secondary | ICD-10-CM | POA: Diagnosis not present

## 2015-08-16 DIAGNOSIS — K922 Gastrointestinal hemorrhage, unspecified: Secondary | ICD-10-CM | POA: Diagnosis not present

## 2015-08-16 DIAGNOSIS — N183 Chronic kidney disease, stage 3 (moderate): Secondary | ICD-10-CM | POA: Diagnosis not present

## 2015-08-16 DIAGNOSIS — E1142 Type 2 diabetes mellitus with diabetic polyneuropathy: Secondary | ICD-10-CM | POA: Diagnosis not present

## 2015-08-16 DIAGNOSIS — I5032 Chronic diastolic (congestive) heart failure: Secondary | ICD-10-CM | POA: Diagnosis not present

## 2015-08-17 ENCOUNTER — Other Ambulatory Visit: Payer: Medicare Other

## 2015-08-17 DIAGNOSIS — I5032 Chronic diastolic (congestive) heart failure: Secondary | ICD-10-CM | POA: Diagnosis not present

## 2015-08-17 DIAGNOSIS — N183 Chronic kidney disease, stage 3 (moderate): Secondary | ICD-10-CM | POA: Diagnosis not present

## 2015-08-17 DIAGNOSIS — K922 Gastrointestinal hemorrhage, unspecified: Secondary | ICD-10-CM | POA: Diagnosis not present

## 2015-08-17 DIAGNOSIS — E1122 Type 2 diabetes mellitus with diabetic chronic kidney disease: Secondary | ICD-10-CM | POA: Diagnosis not present

## 2015-08-17 DIAGNOSIS — I130 Hypertensive heart and chronic kidney disease with heart failure and stage 1 through stage 4 chronic kidney disease, or unspecified chronic kidney disease: Secondary | ICD-10-CM | POA: Diagnosis not present

## 2015-08-17 DIAGNOSIS — E1142 Type 2 diabetes mellitus with diabetic polyneuropathy: Secondary | ICD-10-CM | POA: Diagnosis not present

## 2015-08-18 ENCOUNTER — Other Ambulatory Visit (INDEPENDENT_AMBULATORY_CARE_PROVIDER_SITE_OTHER): Payer: Medicare Other

## 2015-08-18 ENCOUNTER — Ambulatory Visit (INDEPENDENT_AMBULATORY_CARE_PROVIDER_SITE_OTHER): Payer: Medicare Other | Admitting: Family Medicine

## 2015-08-18 ENCOUNTER — Encounter: Payer: Self-pay | Admitting: Family Medicine

## 2015-08-18 VITALS — BP 126/62 | HR 66 | Temp 98.3°F | Ht 70.0 in | Wt 242.0 lb

## 2015-08-18 DIAGNOSIS — D62 Acute posthemorrhagic anemia: Secondary | ICD-10-CM

## 2015-08-18 DIAGNOSIS — H6120 Impacted cerumen, unspecified ear: Secondary | ICD-10-CM | POA: Insufficient documentation

## 2015-08-18 DIAGNOSIS — H6123 Impacted cerumen, bilateral: Secondary | ICD-10-CM

## 2015-08-18 LAB — CBC WITH DIFFERENTIAL/PLATELET
BASOS ABS: 0.1 10*3/uL (ref 0.0–0.1)
Basophils Relative: 1 % (ref 0.0–3.0)
EOS PCT: 5.3 % — AB (ref 0.0–5.0)
Eosinophils Absolute: 0.3 10*3/uL (ref 0.0–0.7)
HEMATOCRIT: 24.9 % — AB (ref 39.0–52.0)
HEMOGLOBIN: 8.2 g/dL — AB (ref 13.0–17.0)
Lymphocytes Relative: 10.8 % — ABNORMAL LOW (ref 12.0–46.0)
Lymphs Abs: 0.6 10*3/uL — ABNORMAL LOW (ref 0.7–4.0)
MCHC: 33 g/dL (ref 30.0–36.0)
MCV: 86.5 fl (ref 78.0–100.0)
MONOS PCT: 9.7 % (ref 3.0–12.0)
Monocytes Absolute: 0.6 10*3/uL (ref 0.1–1.0)
Neutro Abs: 4.2 10*3/uL (ref 1.4–7.7)
Neutrophils Relative %: 73.2 % (ref 43.0–77.0)
Platelets: 252 10*3/uL (ref 150.0–400.0)
RBC: 2.87 Mil/uL — ABNORMAL LOW (ref 4.22–5.81)
RDW: 14.8 % (ref 11.5–15.5)
WBC: 5.7 10*3/uL (ref 4.0–10.5)

## 2015-08-18 NOTE — Progress Notes (Signed)
Patient ID: [name], male   DOB: [birth year], 82 y.o.   MRN: [id number]  Tommi Rumps, MD Phone: [phone]  [name] is a 82 y.o. male who presents today for same day visit.  Patient reports he feels as though he has wax in his ears. He would like his ears checked today. He notes he is hard of hearing at baseline and follows with ENT for this. He notes he feels as though there has been wax in his ears for the last several months. No tinnitus, ear pain, ear fullness, or dizziness.   ROS see history of present illness  Objective  Physical Exam Filed Vitals:   08/18/15 1330  BP: 126/62  Pulse: 66  Temp: 98.3 F (36.8 C)    Physical Exam  Constitutional: He is well-developed, well-nourished, and in no distress.  HENT:  Head: Normocephalic and atraumatic.  Right Ear: External ear normal.  Left Ear: External ear normal.  Bilateral ear canals with minimal cerumen, TMs visualized easily and normal  Skin: He is not diaphoretic.     Assessment/Plan: Please see individual problem list.  Minimal cerumen bilateral ear canals Patient with minimal cerumen in bilateral ear canals. Discussed that this did not warrant ear irrigation. Normal tympanic membranes visualized. Discussed over-the-counter eardrops to help with removal. Also discussed potentially having him see an audiologist or ear nose and throat physician for evaluation of his hearing. He declined this at this time. He will use over-the-counter earwax removal drops. Advised not to use Q-tips to clean his ears out. He will continue to monitor. Given return precautions.    Tommi Rumps, MD [place]

## 2015-08-18 NOTE — Patient Instructions (Signed)
Nice to see you. You do not have an appreciable amount of wax in either ear. You can try over-the-counter earwax removal drops to help soften this up and get this minimal earwax out. If you develop dizziness, ear pain, worsening trouble hearing, ringing in her years, or any new or changing symptoms please seek medical attention.

## 2015-08-18 NOTE — Assessment & Plan Note (Addendum)
Patient with minimal cerumen in bilateral ear canals. Discussed that this did not warrant ear irrigation. Normal tympanic membranes visualized. Discussed over-the-counter eardrops to help with removal. Also discussed potentially having him see an audiologist or ear nose and throat physician for evaluation of his hearing. He declined this at this time. He will use over-the-counter earwax removal drops. Advised not to use Q-tips to clean his ears out. He will continue to monitor. Given return precautions.

## 2015-08-18 NOTE — Progress Notes (Signed)
Pre visit review using our clinic review tool, if applicable. No additional management support is needed unless otherwise documented below in the visit note. 
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
## 2015-08-21 ENCOUNTER — Encounter: Payer: Self-pay | Admitting: Nurse Practitioner

## 2015-08-21 ENCOUNTER — Ambulatory Visit (INDEPENDENT_AMBULATORY_CARE_PROVIDER_SITE_OTHER): Payer: Medicare Other | Admitting: Internal Medicine

## 2015-08-21 VITALS — BP 172/70 | HR 80 | Ht 70.0 in | Wt 238.0 lb

## 2015-08-21 DIAGNOSIS — I481 Persistent atrial fibrillation: Secondary | ICD-10-CM | POA: Diagnosis not present

## 2015-08-21 DIAGNOSIS — K31819 Angiodysplasia of stomach and duodenum without bleeding: Secondary | ICD-10-CM

## 2015-08-21 DIAGNOSIS — I4819 Other persistent atrial fibrillation: Secondary | ICD-10-CM

## 2015-08-21 NOTE — Patient Instructions (Addendum)
Medication Instructions:  Your physician recommends that you continue on your current medications as directed. Please refer to the Current Medication list given to you today.   Labwork: None ordered Weekly CBC's with PCP   Testing/Procedures: Your physician has requested that you have a TEE. During a TEE, sound waves are used to create images of your heart. It provides your doctor with information about the size and shape of your heart and how well your heart's chambers and valves are working. In this test, a transducer is attached to the end of a flexible tube that's guided down your throat and into your esophagus (the tube leading from you mouth to your stomach) to get a more detailed image of your heart. You are not awake for the procedure. Please see the instruction sheet given to you today. For further information please visit [email or web address].   Please arrive at the [place] [facility] on 09/27/15 at 9:00am.  Do Not eat or drink after midnight the night before your procedure.  Do not take any medications the morning of your procedure.  You will need someone to drive you home after procedure  Follow-Up: Your physician recommends that you schedule a follow-up appointment in: 8 weeks with Dr Allred(Amber to see)   Any Other Special Instructions Will Be Listed Below (If Applicable).  Pre-cert sent     If you need a refill on your cardiac medications before your next appointment, please call your pharmacy.

## 2015-08-22 DIAGNOSIS — E1122 Type 2 diabetes mellitus with diabetic chronic kidney disease: Secondary | ICD-10-CM | POA: Diagnosis not present

## 2015-08-22 DIAGNOSIS — K922 Gastrointestinal hemorrhage, unspecified: Secondary | ICD-10-CM | POA: Diagnosis not present

## 2015-08-22 DIAGNOSIS — E1142 Type 2 diabetes mellitus with diabetic polyneuropathy: Secondary | ICD-10-CM | POA: Diagnosis not present

## 2015-08-22 DIAGNOSIS — I130 Hypertensive heart and chronic kidney disease with heart failure and stage 1 through stage 4 chronic kidney disease, or unspecified chronic kidney disease: Secondary | ICD-10-CM | POA: Diagnosis not present

## 2015-08-22 DIAGNOSIS — I5032 Chronic diastolic (congestive) heart failure: Secondary | ICD-10-CM | POA: Diagnosis not present

## 2015-08-22 DIAGNOSIS — N183 Chronic kidney disease, stage 3 (moderate): Secondary | ICD-10-CM | POA: Diagnosis not present

## 2015-08-23 ENCOUNTER — Other Ambulatory Visit: Payer: Medicare Other

## 2015-08-23 ENCOUNTER — Ambulatory Visit: Payer: Medicare Other

## 2015-08-24 DIAGNOSIS — I5032 Chronic diastolic (congestive) heart failure: Secondary | ICD-10-CM | POA: Diagnosis not present

## 2015-08-24 DIAGNOSIS — E1142 Type 2 diabetes mellitus with diabetic polyneuropathy: Secondary | ICD-10-CM | POA: Diagnosis not present

## 2015-08-24 DIAGNOSIS — E1122 Type 2 diabetes mellitus with diabetic chronic kidney disease: Secondary | ICD-10-CM | POA: Diagnosis not present

## 2015-08-24 DIAGNOSIS — K922 Gastrointestinal hemorrhage, unspecified: Secondary | ICD-10-CM | POA: Diagnosis not present

## 2015-08-24 DIAGNOSIS — I130 Hypertensive heart and chronic kidney disease with heart failure and stage 1 through stage 4 chronic kidney disease, or unspecified chronic kidney disease: Secondary | ICD-10-CM | POA: Diagnosis not present

## 2015-08-24 DIAGNOSIS — N183 Chronic kidney disease, stage 3 (moderate): Secondary | ICD-10-CM | POA: Diagnosis not present

## 2015-08-25 ENCOUNTER — Inpatient Hospital Stay (HOSPITAL_BASED_OUTPATIENT_CLINIC_OR_DEPARTMENT_OTHER): Payer: Medicare Other | Admitting: Family Medicine

## 2015-08-25 ENCOUNTER — Inpatient Hospital Stay: Payer: Medicare Other | Attending: Family Medicine

## 2015-08-25 ENCOUNTER — Telehealth: Payer: Self-pay | Admitting: Internal Medicine

## 2015-08-25 VITALS — BP 175/67 | HR 71 | Temp 97.3°F | Resp 18 | Wt 239.2 lb

## 2015-08-25 DIAGNOSIS — E1142 Type 2 diabetes mellitus with diabetic polyneuropathy: Secondary | ICD-10-CM | POA: Diagnosis not present

## 2015-08-25 DIAGNOSIS — Z8673 Personal history of transient ischemic attack (TIA), and cerebral infarction without residual deficits: Secondary | ICD-10-CM | POA: Diagnosis not present

## 2015-08-25 DIAGNOSIS — R5383 Other fatigue: Secondary | ICD-10-CM | POA: Diagnosis not present

## 2015-08-25 DIAGNOSIS — E785 Hyperlipidemia, unspecified: Secondary | ICD-10-CM | POA: Insufficient documentation

## 2015-08-25 DIAGNOSIS — Z8551 Personal history of malignant neoplasm of bladder: Secondary | ICD-10-CM

## 2015-08-25 DIAGNOSIS — N189 Chronic kidney disease, unspecified: Secondary | ICD-10-CM

## 2015-08-25 DIAGNOSIS — I129 Hypertensive chronic kidney disease with stage 1 through stage 4 chronic kidney disease, or unspecified chronic kidney disease: Secondary | ICD-10-CM | POA: Insufficient documentation

## 2015-08-25 DIAGNOSIS — R531 Weakness: Secondary | ICD-10-CM

## 2015-08-25 DIAGNOSIS — Z87891 Personal history of nicotine dependence: Secondary | ICD-10-CM | POA: Insufficient documentation

## 2015-08-25 DIAGNOSIS — D631 Anemia in chronic kidney disease: Secondary | ICD-10-CM | POA: Insufficient documentation

## 2015-08-25 DIAGNOSIS — I482 Chronic atrial fibrillation: Secondary | ICD-10-CM

## 2015-08-25 DIAGNOSIS — N400 Enlarged prostate without lower urinary tract symptoms: Secondary | ICD-10-CM | POA: Diagnosis not present

## 2015-08-25 DIAGNOSIS — I350 Nonrheumatic aortic (valve) stenosis: Secondary | ICD-10-CM

## 2015-08-25 DIAGNOSIS — I2510 Atherosclerotic heart disease of native coronary artery without angina pectoris: Secondary | ICD-10-CM

## 2015-08-25 DIAGNOSIS — E1122 Type 2 diabetes mellitus with diabetic chronic kidney disease: Secondary | ICD-10-CM | POA: Diagnosis not present

## 2015-08-25 DIAGNOSIS — I5032 Chronic diastolic (congestive) heart failure: Secondary | ICD-10-CM | POA: Insufficient documentation

## 2015-08-25 DIAGNOSIS — Z794 Long term (current) use of insulin: Secondary | ICD-10-CM | POA: Insufficient documentation

## 2015-08-25 DIAGNOSIS — D649 Anemia, unspecified: Secondary | ICD-10-CM

## 2015-08-25 DIAGNOSIS — M1990 Unspecified osteoarthritis, unspecified site: Secondary | ICD-10-CM | POA: Diagnosis not present

## 2015-08-25 DIAGNOSIS — E119 Type 2 diabetes mellitus without complications: Secondary | ICD-10-CM

## 2015-08-25 DIAGNOSIS — N183 Chronic kidney disease, stage 3 (moderate): Secondary | ICD-10-CM | POA: Diagnosis not present

## 2015-08-25 DIAGNOSIS — I441 Atrioventricular block, second degree: Secondary | ICD-10-CM

## 2015-08-25 DIAGNOSIS — Z79899 Other long term (current) drug therapy: Secondary | ICD-10-CM | POA: Insufficient documentation

## 2015-08-25 DIAGNOSIS — Z7901 Long term (current) use of anticoagulants: Secondary | ICD-10-CM | POA: Insufficient documentation

## 2015-08-25 DIAGNOSIS — K922 Gastrointestinal hemorrhage, unspecified: Secondary | ICD-10-CM | POA: Diagnosis not present

## 2015-08-25 DIAGNOSIS — D508 Other iron deficiency anemias: Secondary | ICD-10-CM

## 2015-08-25 DIAGNOSIS — I130 Hypertensive heart and chronic kidney disease with heart failure and stage 1 through stage 4 chronic kidney disease, or unspecified chronic kidney disease: Secondary | ICD-10-CM | POA: Diagnosis not present

## 2015-08-25 LAB — CBC WITH DIFFERENTIAL/PLATELET
BASOS ABS: 0.1 10*3/uL (ref 0–0.1)
BASOS PCT: 1 %
Eosinophils Absolute: 0.4 10*3/uL (ref 0–0.7)
Eosinophils Relative: 7 %
HEMATOCRIT: 23.7 % — AB (ref 40.0–52.0)
HEMOGLOBIN: 8 g/dL — AB (ref 13.0–18.0)
LYMPHS PCT: 10 %
Lymphs Abs: 0.6 10*3/uL — ABNORMAL LOW (ref 1.0–3.6)
MCH: 28.8 pg (ref 26.0–34.0)
MCHC: 33.5 g/dL (ref 32.0–36.0)
MCV: 85.9 fL (ref 80.0–100.0)
MONOS PCT: 10 %
Monocytes Absolute: 0.6 10*3/uL (ref 0.2–1.0)
NEUTROS ABS: 4.4 10*3/uL (ref 1.4–6.5)
NEUTROS PCT: 72 %
Platelets: 209 10*3/uL (ref 150–440)
RBC: 2.76 MIL/uL — ABNORMAL LOW (ref 4.40–5.90)
RDW: 14.4 % (ref 11.5–14.5)
WBC: 6 10*3/uL (ref 3.8–10.6)

## 2015-08-25 LAB — PREPARE RBC (CROSSMATCH)

## 2015-08-25 LAB — SAMPLE TO BLOOD BANK

## 2015-08-25 NOTE — Telephone Encounter (Signed)
[facility] is [facility] and will be in contact after transfusion.

## 2015-08-25 NOTE — Telephone Encounter (Signed)
His hemoglobin has not changed much from last week but is now low at 8.  If he is feeling short of breath,  We will need to arrange a transfusion.  If not,  Repeat CBC is needed weekly per Dr Rayann Heman.
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
## 2015-08-28 ENCOUNTER — Inpatient Hospital Stay: Payer: Medicare Other

## 2015-08-28 VITALS — BP 170/67 | HR 60 | Temp 97.4°F | Resp 20

## 2015-08-28 DIAGNOSIS — R5383 Other fatigue: Secondary | ICD-10-CM | POA: Diagnosis not present

## 2015-08-28 DIAGNOSIS — D508 Other iron deficiency anemias: Secondary | ICD-10-CM

## 2015-08-28 DIAGNOSIS — D649 Anemia, unspecified: Secondary | ICD-10-CM | POA: Diagnosis not present

## 2015-08-28 DIAGNOSIS — D631 Anemia in chronic kidney disease: Secondary | ICD-10-CM | POA: Diagnosis not present

## 2015-08-28 DIAGNOSIS — N189 Chronic kidney disease, unspecified: Secondary | ICD-10-CM | POA: Diagnosis not present

## 2015-08-28 DIAGNOSIS — I129 Hypertensive chronic kidney disease with stage 1 through stage 4 chronic kidney disease, or unspecified chronic kidney disease: Secondary | ICD-10-CM | POA: Diagnosis not present

## 2015-08-28 DIAGNOSIS — R531 Weakness: Secondary | ICD-10-CM | POA: Diagnosis not present

## 2015-08-28 MED ORDER — SODIUM CHLORIDE 0.9 % IV SOLN
250.0000 mL | Freq: Once | INTRAVENOUS | Status: AC
  Administered 2015-08-28: 250 mL via INTRAVENOUS
  Filled 2015-08-28: qty 250

## 2015-08-28 MED ORDER — ACETAMINOPHEN 325 MG PO TABS
650.0000 mg | ORAL_TABLET | Freq: Once | ORAL | Status: AC
  Administered 2015-08-28: 650 mg via ORAL
  Filled 2015-08-28: qty 2

## 2015-08-28 MED ORDER — DIPHENHYDRAMINE HCL 25 MG PO CAPS
25.0000 mg | ORAL_CAPSULE | Freq: Once | ORAL | Status: AC
  Administered 2015-08-28: 25 mg via ORAL
  Filled 2015-08-28: qty 1

## 2015-08-29 DIAGNOSIS — I130 Hypertensive heart and chronic kidney disease with heart failure and stage 1 through stage 4 chronic kidney disease, or unspecified chronic kidney disease: Secondary | ICD-10-CM | POA: Diagnosis not present

## 2015-08-29 DIAGNOSIS — E1142 Type 2 diabetes mellitus with diabetic polyneuropathy: Secondary | ICD-10-CM | POA: Diagnosis not present

## 2015-08-29 DIAGNOSIS — N183 Chronic kidney disease, stage 3 (moderate): Secondary | ICD-10-CM | POA: Diagnosis not present

## 2015-08-29 DIAGNOSIS — I5032 Chronic diastolic (congestive) heart failure: Secondary | ICD-10-CM | POA: Diagnosis not present

## 2015-08-29 DIAGNOSIS — E1122 Type 2 diabetes mellitus with diabetic chronic kidney disease: Secondary | ICD-10-CM | POA: Diagnosis not present

## 2015-08-29 DIAGNOSIS — K922 Gastrointestinal hemorrhage, unspecified: Secondary | ICD-10-CM | POA: Diagnosis not present

## 2015-08-29 LAB — TYPE AND SCREEN
ABO/RH(D): O POS
Antibody Screen: NEGATIVE
Unit division: 0

## 2015-08-31 DIAGNOSIS — E1140 Type 2 diabetes mellitus with diabetic neuropathy, unspecified: Secondary | ICD-10-CM | POA: Diagnosis not present

## 2015-08-31 DIAGNOSIS — B351 Tinea unguium: Secondary | ICD-10-CM | POA: Diagnosis not present

## 2015-08-31 DIAGNOSIS — E1122 Type 2 diabetes mellitus with diabetic chronic kidney disease: Secondary | ICD-10-CM | POA: Diagnosis not present

## 2015-08-31 DIAGNOSIS — E1142 Type 2 diabetes mellitus with diabetic polyneuropathy: Secondary | ICD-10-CM | POA: Diagnosis not present

## 2015-08-31 DIAGNOSIS — I130 Hypertensive heart and chronic kidney disease with heart failure and stage 1 through stage 4 chronic kidney disease, or unspecified chronic kidney disease: Secondary | ICD-10-CM | POA: Diagnosis not present

## 2015-08-31 DIAGNOSIS — K922 Gastrointestinal hemorrhage, unspecified: Secondary | ICD-10-CM | POA: Diagnosis not present

## 2015-08-31 DIAGNOSIS — N183 Chronic kidney disease, stage 3 (moderate): Secondary | ICD-10-CM | POA: Diagnosis not present

## 2015-08-31 DIAGNOSIS — Z794 Long term (current) use of insulin: Secondary | ICD-10-CM | POA: Diagnosis not present

## 2015-08-31 DIAGNOSIS — I5032 Chronic diastolic (congestive) heart failure: Secondary | ICD-10-CM | POA: Diagnosis not present

## 2015-09-01 ENCOUNTER — Ambulatory Visit (INDEPENDENT_AMBULATORY_CARE_PROVIDER_SITE_OTHER): Payer: Medicare Other | Admitting: Cardiovascular Disease

## 2015-09-01 ENCOUNTER — Encounter: Payer: Self-pay | Admitting: Cardiovascular Disease

## 2015-09-01 VITALS — BP 142/60 | HR 58 | Ht 70.0 in | Wt 238.2 lb

## 2015-09-01 DIAGNOSIS — I10 Essential (primary) hypertension: Secondary | ICD-10-CM | POA: Diagnosis not present

## 2015-09-01 DIAGNOSIS — E785 Hyperlipidemia, unspecified: Secondary | ICD-10-CM

## 2015-09-01 DIAGNOSIS — I2510 Atherosclerotic heart disease of native coronary artery without angina pectoris: Secondary | ICD-10-CM | POA: Diagnosis not present

## 2015-09-01 DIAGNOSIS — K922 Gastrointestinal hemorrhage, unspecified: Secondary | ICD-10-CM

## 2015-09-01 DIAGNOSIS — I5032 Chronic diastolic (congestive) heart failure: Secondary | ICD-10-CM

## 2015-09-01 DIAGNOSIS — I481 Persistent atrial fibrillation: Secondary | ICD-10-CM | POA: Diagnosis not present

## 2015-09-01 DIAGNOSIS — I4819 Other persistent atrial fibrillation: Secondary | ICD-10-CM

## 2015-09-01 NOTE — Assessment & Plan Note (Signed)
Numerous hospital admissions for GI bleeding Hospital records reviewed Thorough workup in the hospital,  No clear source of the bleed, decision made to hold anticoagulation in several weeks' time

## 2015-09-01 NOTE — Assessment & Plan Note (Signed)
Not on a statin.  Cholesterol is at goal on the current lipid regimen.    Total encounter time more than 25 minutes  Greater than 50% was spent in counseling and coordination of care with the patient

## 2015-09-01 NOTE — Assessment & Plan Note (Signed)
Appears relatively euvolemic Takes Lasix sparingly, lungs are relatively clear, no significant leg edema

## 2015-09-01 NOTE — Assessment & Plan Note (Signed)
Blood pressure is well controlled on today's visit. No changes made to the medications. 

## 2015-09-01 NOTE — Patient Instructions (Signed)
You are doing well. No medication changes were made.  Please call us if you have new issues that need to be addressed before your next appt.  Your physician wants you to follow-up in: 6 months.  You will receive a reminder letter in the mail two months in advance. If you don't receive a letter, please call our office to schedule the follow-up appointment.   

## 2015-09-01 NOTE — Assessment & Plan Note (Signed)
Currently with no symptoms of angina. No further workup at this time. Continue current medication regimen. 
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
## 2015-09-01 NOTE — Assessment & Plan Note (Signed)
Heart rate well controlled on carvedilol Long history of GI bleeding on anticoagulation Recent placement of watch woman device with plan to discontinue anticoagulation after follow-up transesophageal echo

## 2015-09-04 DIAGNOSIS — N183 Chronic kidney disease, stage 3 (moderate): Secondary | ICD-10-CM | POA: Diagnosis not present

## 2015-09-04 DIAGNOSIS — M150 Primary generalized (osteo)arthritis: Secondary | ICD-10-CM | POA: Diagnosis not present

## 2015-09-04 DIAGNOSIS — M0579 Rheumatoid arthritis with rheumatoid factor of multiple sites without organ or systems involvement: Secondary | ICD-10-CM | POA: Diagnosis not present

## 2015-09-05 DIAGNOSIS — E1122 Type 2 diabetes mellitus with diabetic chronic kidney disease: Secondary | ICD-10-CM | POA: Diagnosis not present

## 2015-09-05 DIAGNOSIS — K922 Gastrointestinal hemorrhage, unspecified: Secondary | ICD-10-CM | POA: Diagnosis not present

## 2015-09-05 DIAGNOSIS — I130 Hypertensive heart and chronic kidney disease with heart failure and stage 1 through stage 4 chronic kidney disease, or unspecified chronic kidney disease: Secondary | ICD-10-CM | POA: Diagnosis not present

## 2015-09-05 DIAGNOSIS — N183 Chronic kidney disease, stage 3 (moderate): Secondary | ICD-10-CM | POA: Diagnosis not present

## 2015-09-05 DIAGNOSIS — E1142 Type 2 diabetes mellitus with diabetic polyneuropathy: Secondary | ICD-10-CM | POA: Diagnosis not present

## 2015-09-05 DIAGNOSIS — I5032 Chronic diastolic (congestive) heart failure: Secondary | ICD-10-CM | POA: Diagnosis not present

## 2015-09-07 DIAGNOSIS — I5032 Chronic diastolic (congestive) heart failure: Secondary | ICD-10-CM | POA: Diagnosis not present

## 2015-09-07 DIAGNOSIS — I130 Hypertensive heart and chronic kidney disease with heart failure and stage 1 through stage 4 chronic kidney disease, or unspecified chronic kidney disease: Secondary | ICD-10-CM | POA: Diagnosis not present

## 2015-09-07 DIAGNOSIS — E1122 Type 2 diabetes mellitus with diabetic chronic kidney disease: Secondary | ICD-10-CM | POA: Diagnosis not present

## 2015-09-07 DIAGNOSIS — E1142 Type 2 diabetes mellitus with diabetic polyneuropathy: Secondary | ICD-10-CM | POA: Diagnosis not present

## 2015-09-07 DIAGNOSIS — K922 Gastrointestinal hemorrhage, unspecified: Secondary | ICD-10-CM | POA: Diagnosis not present

## 2015-09-07 DIAGNOSIS — N183 Chronic kidney disease, stage 3 (moderate): Secondary | ICD-10-CM | POA: Diagnosis not present

## 2015-09-12 ENCOUNTER — Inpatient Hospital Stay: Payer: Medicare Other | Attending: Hematology and Oncology

## 2015-09-12 ENCOUNTER — Other Ambulatory Visit: Payer: Self-pay | Admitting: Family Medicine

## 2015-09-12 ENCOUNTER — Telehealth: Payer: Self-pay | Admitting: Internal Medicine

## 2015-09-12 DIAGNOSIS — I130 Hypertensive heart and chronic kidney disease with heart failure and stage 1 through stage 4 chronic kidney disease, or unspecified chronic kidney disease: Secondary | ICD-10-CM | POA: Diagnosis not present

## 2015-09-12 DIAGNOSIS — I5032 Chronic diastolic (congestive) heart failure: Secondary | ICD-10-CM | POA: Diagnosis not present

## 2015-09-12 DIAGNOSIS — E1122 Type 2 diabetes mellitus with diabetic chronic kidney disease: Secondary | ICD-10-CM | POA: Diagnosis not present

## 2015-09-12 DIAGNOSIS — D509 Iron deficiency anemia, unspecified: Secondary | ICD-10-CM | POA: Insufficient documentation

## 2015-09-12 DIAGNOSIS — N183 Chronic kidney disease, stage 3 (moderate): Secondary | ICD-10-CM | POA: Diagnosis not present

## 2015-09-12 DIAGNOSIS — E1142 Type 2 diabetes mellitus with diabetic polyneuropathy: Secondary | ICD-10-CM | POA: Diagnosis not present

## 2015-09-12 DIAGNOSIS — D508 Other iron deficiency anemias: Secondary | ICD-10-CM

## 2015-09-12 DIAGNOSIS — K922 Gastrointestinal hemorrhage, unspecified: Secondary | ICD-10-CM | POA: Diagnosis not present

## 2015-09-12 LAB — CBC WITH DIFFERENTIAL/PLATELET
BASOS ABS: 0.1 10*3/uL (ref 0–0.1)
Basophils Relative: 2 %
EOS PCT: 4 %
Eosinophils Absolute: 0.3 10*3/uL (ref 0–0.7)
HCT: 25.3 % — ABNORMAL LOW (ref 40.0–52.0)
Hemoglobin: 8.3 g/dL — ABNORMAL LOW (ref 13.0–18.0)
LYMPHS ABS: 0.6 10*3/uL — AB (ref 1.0–3.6)
LYMPHS PCT: 9 %
MCH: 28.2 pg (ref 26.0–34.0)
MCHC: 32.8 g/dL (ref 32.0–36.0)
MCV: 86 fL (ref 80.0–100.0)
MONO ABS: 0.6 10*3/uL (ref 0.2–1.0)
Monocytes Relative: 9 %
Neutro Abs: 4.9 10*3/uL (ref 1.4–6.5)
Neutrophils Relative %: 76 %
PLATELETS: 198 10*3/uL (ref 150–440)
RBC: 2.94 MIL/uL — ABNORMAL LOW (ref 4.40–5.90)
RDW: 15 % — AB (ref 11.5–14.5)
WBC: 6.4 10*3/uL (ref 3.8–10.6)

## 2015-09-12 NOTE — Telephone Encounter (Signed)
Patient stated he stopped furosemide because he was going to the bathroom all the time, patient said PT told him he needed to take them so he did and now he feels better.

## 2015-09-12 NOTE — Telephone Encounter (Signed)
Patient [name] DOB: 11-06-1927 Initial Comment caller states he is having shortness of breath Nurse Assessment Nurse: Marcelline Deist, RN, Lynda Date/Time (Eastern Time): 09/12/2015 2:05:58 PM Confirm and document reason for call. If symptomatic, describe symptoms. You must click the next button to save text entered. ---Caller states he is having shortness of breath, worse today than usual. His PT & caregiver, thought he looked pale, with bags under his eyes. Is weak. Has the patient traveled out of the country within the last 30 days? ---Not Applicable Does the patient have any new or worsening symptoms? ---Yes Will a triage be completed? ---Yes Related visit to physician within the last 2 weeks? ---No Does the PT have any chronic conditions? (i.e. diabetes, asthma, etc.) ---Yes List chronic conditions. ---anemic, on BP rx, on iron, has a list of rxs., stroke hx., diabetic as well Is this a behavioral health or substance abuse call? ---No Guidelines Guideline Title Affirmed Question Affirmed Notes Breathing Difficulty [1] MODERATE difficulty breathing (e.g., speaks in phrases, SOB even at rest, pulse 100-120) AND [2] NEW-onset or WORSE than normal Final Disposition User Go to ED Now Marcelline Deist, RN, Kermit Balo Comments Caller would rather not be seen again at [facility] he trusts whatever his Dr. wants him to do. Nurse will call office & let them know that he wants to be seen at office rather than the ER. No availability on schedule today. Nurse left message explaining that patient would rather be seen in office than the ER as no one answered phone. Referrals GO TO FACILITY UNDECIDED REFERRED TO PCP OFFICE Disagree/Comply: Comply

## 2015-09-12 NOTE — Telephone Encounter (Signed)
Spoke with the patient on the phone.  He states that he is feeling much better now.  He is having SOB unless he exerts himself.  He had PT today and they checked all his vitals and they were per the PT all WNL.  [facility] to the [facility] and they drew labs (CBC) to check his blood counts (They are in the computer, but patient has not been notified yet) He feels weak, but he thinks his blood counts are down.  He is scheduled to see Denisa tomorrow for a wellness visit.  He refused to go to the ED as they don't take good care of him.  He believes our office is better then the ED.  Please advise. Thanks

## 2015-09-12 NOTE — Telephone Encounter (Signed)
His hemoglobin is stable and is still 8.3 .  If his vital signs are normal and he feels better and refuses to go to Ed.  There is nothing else to do at this point.

## 2015-09-13 ENCOUNTER — Ambulatory Visit (INDEPENDENT_AMBULATORY_CARE_PROVIDER_SITE_OTHER): Payer: Medicare Other

## 2015-09-13 VITALS — BP 146/78 | HR 76 | Temp 98.3°F | Resp 16 | Ht 68.5 in | Wt 239.4 lb

## 2015-09-13 DIAGNOSIS — Z0000 Encounter for general adult medical examination without abnormal findings: Secondary | ICD-10-CM

## 2015-09-13 NOTE — Patient Instructions (Addendum)
[name] , Thank you for taking time to come for your Medicare Wellness Visit. I appreciate your ongoing commitment to your health goals. Please review the following plan we discussed and let me know if I can assist you in the future.   Follow up with Dr. Derrel Nip as needed.   This is a list of the screening recommended for you and due dates:  Health Maintenance  Topic Date Due  . Tetanus Vaccine  07/10/1948  . Shingles Vaccine  07/10/1989  . Complete foot exam   09/07/2015  . Eye exam for diabetics  12/18/2015  . Hemoglobin A1C  12/20/2015  . Flu Shot  01/09/2016  . Pneumonia vaccines (2 of 2 - PPSV23) 02/20/2016  . Urine Protein Check  06/20/2016    Hearing Loss Hearing loss is a partial or total loss of the ability to hear. This can be temporary or permanent, and it can happen in one or both ears. Hearing loss may be referred to as deafness. Medical care is necessary to treat hearing loss properly and to prevent the condition from getting worse. Your hearing may partially or completely come back, depending on what caused your hearing loss and how severe it is. In some cases, hearing loss is permanent. CAUSES Common causes of hearing loss include:   Too much wax in the ear canal.   Infection of the ear canal or middle ear.   Fluid in the middle ear.   Injury to the ear or surrounding area.   An object stuck in the ear.   Prolonged exposure to loud sounds, such as music.  Less common causes of hearing loss include:   Tumors in the ear.   Viral or bacterial infections, such as meningitis.   A hole in the eardrum (perforated eardrum).  Problems with the hearing nerve that sends signals between the brain and the ear.  Certain medicines.  SYMPTOMS  Symptoms of this condition may include:  Difficulty telling the difference between sounds.  Difficulty following a conversation when there is background noise.  Lack of response to sounds in your environment.  This may be most noticeable when you do not respond to startling sounds.  Needing to turn up the volume on the television, radio, etc.  Ringing in the ears.  Dizziness.  Pain in the ears. DIAGNOSIS This condition is diagnosed based on a physical exam and a hearing test (audiometry). The audiometry test will be performed by a hearing specialist (audiologist). You may also be referred to an ear, nose, and throat (ENT) specialist (otolaryngologist).  TREATMENT Treatment for recent onset of hearing loss may include:   Ear wax removal.   Being prescribed medicines to prevent infection (antibiotics).   Being prescribed medicines to reduce inflammation (corticosteroids).  HOME CARE INSTRUCTIONS  If you were prescribed an antibiotic medicine, take it as told by your health care provider. Do not stop taking the antibiotic even if you start to feel better.  Take over-the-counter and prescription medicines only as told by your health care provider.  Avoid loud noises.   Return to your normal activities as told by your health care provider. Ask your health care provider what activities are safe for you.  Keep all follow-up visits as told by your health care provider. This is important. SEEK MEDICAL CARE IF:   You feel dizzy.   You develop new symptoms.   You vomit or feel nauseous.   You have a fever.  SEEK IMMEDIATE MEDICAL CARE IF:  You develop sudden changes in your vision.   You have severe ear pain.   You have new or increased weakness.  You have a severe headache.   This information is not intended to replace advice given to you by your health care provider. Make sure you discuss any questions you have with your health care provider.   Document Released: 05/27/2005 Document Revised: 02/15/2015 Document Reviewed: 10/12/2014 Elsevier Interactive Patient Education [employer].
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
## 2015-09-14 DIAGNOSIS — I5032 Chronic diastolic (congestive) heart failure: Secondary | ICD-10-CM | POA: Diagnosis not present

## 2015-09-14 DIAGNOSIS — N183 Chronic kidney disease, stage 3 (moderate): Secondary | ICD-10-CM | POA: Diagnosis not present

## 2015-09-14 DIAGNOSIS — E1142 Type 2 diabetes mellitus with diabetic polyneuropathy: Secondary | ICD-10-CM | POA: Diagnosis not present

## 2015-09-14 DIAGNOSIS — E1122 Type 2 diabetes mellitus with diabetic chronic kidney disease: Secondary | ICD-10-CM | POA: Diagnosis not present

## 2015-09-14 DIAGNOSIS — K922 Gastrointestinal hemorrhage, unspecified: Secondary | ICD-10-CM | POA: Diagnosis not present

## 2015-09-14 DIAGNOSIS — I130 Hypertensive heart and chronic kidney disease with heart failure and stage 1 through stage 4 chronic kidney disease, or unspecified chronic kidney disease: Secondary | ICD-10-CM | POA: Diagnosis not present

## 2015-09-14 NOTE — Progress Notes (Signed)
  I have reviewed the above information and agree with above.   Kalina Morabito, MD 

## 2015-09-15 ENCOUNTER — Other Ambulatory Visit: Payer: Medicare Other

## 2015-09-18 ENCOUNTER — Other Ambulatory Visit: Payer: Self-pay | Admitting: Internal Medicine

## 2015-09-18 DIAGNOSIS — K921 Melena: Secondary | ICD-10-CM

## 2015-09-19 ENCOUNTER — Telehealth: Payer: Self-pay | Admitting: *Deleted

## 2015-09-19 ENCOUNTER — Other Ambulatory Visit: Payer: Self-pay

## 2015-09-19 ENCOUNTER — Other Ambulatory Visit (INDEPENDENT_AMBULATORY_CARE_PROVIDER_SITE_OTHER): Payer: Medicare Other

## 2015-09-19 ENCOUNTER — Inpatient Hospital Stay
Admission: EM | Admit: 2015-09-19 | Discharge: 2015-09-21 | DRG: 378 | Disposition: A | Discharge status: Home Health | Payer: Medicare Other | Attending: Internal Medicine | Admitting: Internal Medicine

## 2015-09-19 ENCOUNTER — Encounter: Payer: Self-pay | Admitting: Medical Oncology

## 2015-09-19 DIAGNOSIS — Z833 Family history of diabetes mellitus: Secondary | ICD-10-CM

## 2015-09-19 DIAGNOSIS — N189 Chronic kidney disease, unspecified: Secondary | ICD-10-CM

## 2015-09-19 DIAGNOSIS — Z8673 Personal history of transient ischemic attack (TIA), and cerebral infarction without residual deficits: Secondary | ICD-10-CM | POA: Insufficient documentation

## 2015-09-19 DIAGNOSIS — I452 Bifascicular block: Secondary | ICD-10-CM | POA: Diagnosis present

## 2015-09-19 DIAGNOSIS — N289 Disorder of kidney and ureter, unspecified: Secondary | ICD-10-CM | POA: Diagnosis not present

## 2015-09-19 DIAGNOSIS — I5032 Chronic diastolic (congestive) heart failure: Secondary | ICD-10-CM | POA: Diagnosis present

## 2015-09-19 DIAGNOSIS — D649 Anemia, unspecified: Secondary | ICD-10-CM

## 2015-09-19 DIAGNOSIS — E785 Hyperlipidemia, unspecified: Secondary | ICD-10-CM | POA: Diagnosis present

## 2015-09-19 DIAGNOSIS — D500 Iron deficiency anemia secondary to blood loss (chronic): Secondary | ICD-10-CM | POA: Diagnosis present

## 2015-09-19 DIAGNOSIS — N400 Enlarged prostate without lower urinary tract symptoms: Secondary | ICD-10-CM | POA: Diagnosis present

## 2015-09-19 DIAGNOSIS — Z823 Family history of stroke: Secondary | ICD-10-CM

## 2015-09-19 DIAGNOSIS — N179 Acute kidney failure, unspecified: Secondary | ICD-10-CM | POA: Diagnosis present

## 2015-09-19 DIAGNOSIS — I481 Persistent atrial fibrillation: Secondary | ICD-10-CM | POA: Diagnosis not present

## 2015-09-19 DIAGNOSIS — Z951 Presence of aortocoronary bypass graft: Secondary | ICD-10-CM | POA: Diagnosis not present

## 2015-09-19 DIAGNOSIS — I2510 Atherosclerotic heart disease of native coronary artery without angina pectoris: Secondary | ICD-10-CM | POA: Diagnosis present

## 2015-09-19 DIAGNOSIS — Z880 Allergy status to penicillin: Secondary | ICD-10-CM

## 2015-09-19 DIAGNOSIS — I130 Hypertensive heart and chronic kidney disease with heart failure and stage 1 through stage 4 chronic kidney disease, or unspecified chronic kidney disease: Secondary | ICD-10-CM | POA: Diagnosis present

## 2015-09-19 DIAGNOSIS — Z8711 Personal history of peptic ulcer disease: Secondary | ICD-10-CM

## 2015-09-19 DIAGNOSIS — E1122 Type 2 diabetes mellitus with diabetic chronic kidney disease: Secondary | ICD-10-CM | POA: Diagnosis present

## 2015-09-19 DIAGNOSIS — Z7901 Long term (current) use of anticoagulants: Secondary | ICD-10-CM | POA: Diagnosis not present

## 2015-09-19 DIAGNOSIS — K921 Melena: Secondary | ICD-10-CM | POA: Diagnosis not present

## 2015-09-19 DIAGNOSIS — Z8551 Personal history of malignant neoplasm of bladder: Secondary | ICD-10-CM | POA: Diagnosis not present

## 2015-09-19 DIAGNOSIS — Z8042 Family history of malignant neoplasm of prostate: Secondary | ICD-10-CM

## 2015-09-19 DIAGNOSIS — K922 Gastrointestinal hemorrhage, unspecified: Secondary | ICD-10-CM | POA: Diagnosis not present

## 2015-09-19 DIAGNOSIS — H3530 Unspecified macular degeneration: Secondary | ICD-10-CM | POA: Diagnosis present

## 2015-09-19 DIAGNOSIS — Z79899 Other long term (current) drug therapy: Secondary | ICD-10-CM

## 2015-09-19 DIAGNOSIS — R0600 Dyspnea, unspecified: Secondary | ICD-10-CM | POA: Diagnosis not present

## 2015-09-19 DIAGNOSIS — I129 Hypertensive chronic kidney disease with stage 1 through stage 4 chronic kidney disease, or unspecified chronic kidney disease: Secondary | ICD-10-CM | POA: Diagnosis not present

## 2015-09-19 DIAGNOSIS — N183 Chronic kidney disease, stage 3 (moderate): Secondary | ICD-10-CM | POA: Diagnosis present

## 2015-09-19 DIAGNOSIS — E871 Hypo-osmolality and hyponatremia: Secondary | ICD-10-CM | POA: Diagnosis present

## 2015-09-19 DIAGNOSIS — R0602 Shortness of breath: Secondary | ICD-10-CM | POA: Diagnosis not present

## 2015-09-19 DIAGNOSIS — Z886 Allergy status to analgesic agent status: Secondary | ICD-10-CM | POA: Diagnosis not present

## 2015-09-19 DIAGNOSIS — E1142 Type 2 diabetes mellitus with diabetic polyneuropathy: Secondary | ICD-10-CM | POA: Diagnosis not present

## 2015-09-19 DIAGNOSIS — Z952 Presence of prosthetic heart valve: Secondary | ICD-10-CM | POA: Diagnosis not present

## 2015-09-19 DIAGNOSIS — Z794 Long term (current) use of insulin: Secondary | ICD-10-CM | POA: Diagnosis not present

## 2015-09-19 DIAGNOSIS — Z888 Allergy status to other drugs, medicaments and biological substances status: Secondary | ICD-10-CM | POA: Diagnosis not present

## 2015-09-19 DIAGNOSIS — Z87891 Personal history of nicotine dependence: Secondary | ICD-10-CM

## 2015-09-19 DIAGNOSIS — I482 Chronic atrial fibrillation: Secondary | ICD-10-CM | POA: Diagnosis present

## 2015-09-19 LAB — CBC WITH DIFFERENTIAL/PLATELET
BASOS ABS: 0.1 10*3/uL (ref 0.0–0.1)
Basophils Absolute: 0.1 10*3/uL (ref 0–0.1)
Basophils Relative: 0.9 % (ref 0.0–3.0)
Basophils Relative: 1 %
EOS ABS: 0.3 10*3/uL (ref 0.0–0.7)
EOS ABS: 0.3 10*3/uL (ref 0–0.7)
Eosinophils Relative: 4.3 % (ref 0.0–5.0)
Eosinophils Relative: 5 %
HCT: 23.5 % — CL (ref 39.0–52.0)
HEMATOCRIT: 22.8 % — AB (ref 40.0–52.0)
HEMOGLOBIN: 7.5 g/dL — AB (ref 13.0–18.0)
Hemoglobin: 7.7 g/dL — CL (ref 13.0–17.0)
LYMPHS ABS: 0.7 10*3/uL (ref 0.7–4.0)
LYMPHS ABS: 0.7 10*3/uL — AB (ref 1.0–3.6)
LYMPHS PCT: 11.2 % — AB (ref 12.0–46.0)
Lymphocytes Relative: 13 %
MCH: 28.1 pg (ref 26.0–34.0)
MCHC: 32.8 g/dL (ref 30.0–36.0)
MCHC: 33.1 g/dL (ref 32.0–36.0)
MCV: 84.8 fl (ref 78.0–100.0)
MCV: 84.9 fL (ref 80.0–100.0)
MONOS PCT: 9.7 % (ref 3.0–12.0)
MONOS PCT: 11 %
Monocytes Absolute: 0.6 10*3/uL (ref 0.1–1.0)
Monocytes Absolute: 0.6 10*3/uL (ref 0.2–1.0)
NEUTROS ABS: 4.6 10*3/uL (ref 1.4–7.7)
NEUTROS ABS: 3.9 10*3/uL (ref 1.4–6.5)
NEUTROS PCT: 73.9 % (ref 43.0–77.0)
NEUTROS PCT: 70 %
PLATELETS: 239 10*3/uL (ref 150.0–400.0)
Platelets: 226 10*3/uL (ref 150–440)
RBC: 2.78 Mil/uL — AB (ref 4.22–5.81)
RBC: 2.68 MIL/uL — ABNORMAL LOW (ref 4.40–5.90)
RDW: 14.9 % (ref 11.5–15.5)
RDW: 14.7 % — ABNORMAL HIGH (ref 11.5–14.5)
WBC: 6.3 10*3/uL (ref 4.0–10.5)
WBC: 5.5 10*3/uL (ref 3.8–10.6)

## 2015-09-19 LAB — URINALYSIS COMPLETE WITH MICROSCOPIC (ARMC ONLY)
BACTERIA UA: NONE SEEN
Bilirubin Urine: NEGATIVE
Glucose, UA: 50 mg/dL — AB
Hgb urine dipstick: NEGATIVE
Ketones, ur: NEGATIVE mg/dL
LEUKOCYTES UA: NEGATIVE
Nitrite: NEGATIVE
PH: 5 (ref 5.0–8.0)
PROTEIN: 100 mg/dL — AB
Specific Gravity, Urine: 1.008 (ref 1.005–1.030)

## 2015-09-19 LAB — COMPREHENSIVE METABOLIC PANEL
ALK PHOS: 108 U/L (ref 38–126)
ALT: 15 U/L — AB (ref 17–63)
AST: 25 U/L (ref 15–41)
Albumin: 3.6 g/dL (ref 3.5–5.0)
Anion gap: 7 (ref 5–15)
BILIRUBIN TOTAL: 0.5 mg/dL (ref 0.3–1.2)
BUN: 44 mg/dL — ABNORMAL HIGH (ref 6–20)
CALCIUM: 8.6 mg/dL — AB (ref 8.9–10.3)
CO2: 22 mmol/L (ref 22–32)
CREATININE: 2.07 mg/dL — AB (ref 0.61–1.24)
Chloride: 102 mmol/L (ref 101–111)
GFR calc non Af Amer: 27 mL/min — ABNORMAL LOW (ref >60)
GFR, EST AFRICAN AMERICAN: 32 mL/min — AB (ref >60)
GLUCOSE: 195 mg/dL — AB (ref 65–99)
Potassium: 4.5 mmol/L (ref 3.5–5.1)
SODIUM: 131 mmol/L — AB (ref 135–145)
TOTAL PROTEIN: 6.7 g/dL (ref 6.5–8.1)

## 2015-09-19 LAB — GLUCOSE, CAPILLARY: Glucose-Capillary: 142 mg/dL — ABNORMAL HIGH (ref 65–99)

## 2015-09-19 LAB — PROTIME-INR
INR: 1.18
Prothrombin Time: 15.2 seconds — ABNORMAL HIGH (ref 11.4–15.0)

## 2015-09-19 LAB — PREPARE RBC (CROSSMATCH)

## 2015-09-19 LAB — TROPONIN I: Troponin I: <0.03 ng/mL (ref <0.031)

## 2015-09-19 MED ORDER — CALCITRIOL 0.25 MCG PO CAPS
0.2500 ug | ORAL_CAPSULE | ORAL | Status: DC
  Administered 2015-09-20: 0.25 ug via ORAL
  Filled 2015-09-19 (×2): qty 1

## 2015-09-19 MED ORDER — CARVEDILOL 6.25 MG PO TABS
6.2500 mg | ORAL_TABLET | Freq: Two times a day (BID) | ORAL | Status: DC
  Administered 2015-09-19 – 2015-09-21 (×4): 6.25 mg via ORAL
  Filled 2015-09-19 (×4): qty 1

## 2015-09-19 MED ORDER — HYDROXYCHLOROQUINE SULFATE 200 MG PO TABS
200.0000 mg | ORAL_TABLET | Freq: Every day | ORAL | Status: DC
  Administered 2015-09-20 – 2015-09-21 (×2): 200 mg via ORAL
  Filled 2015-09-19 (×3): qty 1

## 2015-09-19 MED ORDER — VITAMIN D 1000 UNITS PO TABS
1000.0000 [IU] | ORAL_TABLET | Freq: Every day | ORAL | Status: DC
  Administered 2015-09-20 – 2015-09-21 (×2): 1000 [IU] via ORAL
  Filled 2015-09-19 (×3): qty 1

## 2015-09-19 MED ORDER — FERROUS SULFATE 325 (65 FE) MG PO TABS
325.0000 mg | ORAL_TABLET | Freq: Every day | ORAL | Status: DC
  Administered 2015-09-20 – 2015-09-21 (×2): 325 mg via ORAL
  Filled 2015-09-19 (×2): qty 1

## 2015-09-19 MED ORDER — ESCITALOPRAM OXALATE 10 MG PO TABS
10.0000 mg | ORAL_TABLET | Freq: Every day | ORAL | Status: DC
  Administered 2015-09-19 – 2015-09-20 (×2): 10 mg via ORAL
  Filled 2015-09-19 (×2): qty 1

## 2015-09-19 MED ORDER — ADULT MULTIVITAMIN W/MINERALS CH
1.0000 | ORAL_TABLET | Freq: Every day | ORAL | Status: DC
  Administered 2015-09-20 – 2015-09-21 (×2): 1 via ORAL
  Filled 2015-09-19 (×2): qty 1

## 2015-09-19 MED ORDER — PANTOPRAZOLE SODIUM 40 MG IV SOLR
40.0000 mg | Freq: Two times a day (BID) | INTRAVENOUS | Status: DC
  Administered 2015-09-19 – 2015-09-21 (×4): 40 mg via INTRAVENOUS
  Filled 2015-09-19 (×4): qty 40

## 2015-09-19 MED ORDER — INSULIN ASPART 100 UNIT/ML ~~LOC~~ SOLN
0.0000 [IU] | Freq: Every day | SUBCUTANEOUS | Status: DC
  Administered 2015-09-20: 2 [IU] via SUBCUTANEOUS
  Filled 2015-09-19: qty 2

## 2015-09-19 MED ORDER — MORPHINE SULFATE (PF) 2 MG/ML IV SOLN: 2.0000 mg | INTRAVENOUS | Status: DC | PRN

## 2015-09-19 MED ORDER — ISOSORBIDE MONONITRATE ER 30 MG PO TB24
30.0000 mg | ORAL_TABLET | Freq: Every day | ORAL | Status: DC
  Administered 2015-09-20 – 2015-09-21 (×2): 30 mg via ORAL
  Filled 2015-09-19 (×2): qty 1

## 2015-09-19 MED ORDER — PANTOPRAZOLE SODIUM 40 MG PO TBEC: 40.0000 mg | DELAYED_RELEASE_TABLET | Freq: Every day | ORAL | Status: DC

## 2015-09-19 MED ORDER — OCUVITE-LUTEIN PO CAPS
2.0000 | ORAL_CAPSULE | Freq: Two times a day (BID) | ORAL | Status: DC
  Administered 2015-09-20 – 2015-09-21 (×3): 2 via ORAL
  Filled 2015-09-19 (×4): qty 2

## 2015-09-19 MED ORDER — OXYCODONE HCL 5 MG PO TABS
5.0000 mg | ORAL_TABLET | ORAL | Status: DC | PRN
  Administered 2015-09-21: 5 mg via ORAL
  Filled 2015-09-19: qty 1

## 2015-09-19 MED ORDER — TAMSULOSIN HCL 0.4 MG PO CAPS
0.4000 mg | ORAL_CAPSULE | Freq: Every day | ORAL | Status: DC
  Administered 2015-09-20: 0.4 mg via ORAL
  Filled 2015-09-19: qty 1

## 2015-09-19 MED ORDER — VITAMIN C 500 MG PO TABS
500.0000 mg | ORAL_TABLET | Freq: Every day | ORAL | Status: DC
  Administered 2015-09-20 – 2015-09-21 (×2): 500 mg via ORAL
  Filled 2015-09-19 (×3): qty 1

## 2015-09-19 MED ORDER — INSULIN GLARGINE 100 UNIT/ML ~~LOC~~ SOLN
25.0000 [IU] | Freq: Every day | SUBCUTANEOUS | Status: DC
  Administered 2015-09-20: 25 [IU] via SUBCUTANEOUS
  Filled 2015-09-19 (×3): qty 0.25

## 2015-09-19 MED ORDER — FUROSEMIDE 40 MG PO TABS
20.0000 mg | ORAL_TABLET | Freq: Every day | ORAL | Status: DC
  Administered 2015-09-20: 20 mg via ORAL
  Filled 2015-09-19: qty 1

## 2015-09-19 MED ORDER — SODIUM CHLORIDE 0.9 % IV SOLN
10.0000 mL/h | Freq: Once | INTRAVENOUS | Status: AC
  Administered 2015-09-19: 10 mL/h via INTRAVENOUS

## 2015-09-19 MED ORDER — ACETAMINOPHEN 325 MG PO TABS
650.0000 mg | ORAL_TABLET | Freq: Four times a day (QID) | ORAL | Status: DC | PRN
  Administered 2015-09-21: 650 mg via ORAL
  Filled 2015-09-19: qty 2

## 2015-09-19 MED ORDER — INSULIN ASPART 100 UNIT/ML ~~LOC~~ SOLN
0.0000 [IU] | Freq: Three times a day (TID) | SUBCUTANEOUS | Status: DC
  Administered 2015-09-20 (×2): 5 [IU] via SUBCUTANEOUS
  Administered 2015-09-21: 9 [IU] via SUBCUTANEOUS
  Filled 2015-09-19 (×2): qty 5
  Filled 2015-09-19: qty 2
  Filled 2015-09-19: qty 9

## 2015-09-19 MED ORDER — FUROSEMIDE 10 MG/ML IJ SOLN
20.0000 mg | Freq: Once | INTRAMUSCULAR | Status: AC
  Administered 2015-09-19: 20 mg via INTRAVENOUS
  Filled 2015-09-19: qty 2

## 2015-09-19 MED ORDER — AMLODIPINE BESYLATE 5 MG PO TABS
5.0000 mg | ORAL_TABLET | Freq: Every day | ORAL | Status: DC
  Administered 2015-09-20 – 2015-09-21 (×2): 5 mg via ORAL
  Filled 2015-09-19 (×3): qty 1

## 2015-09-19 MED ORDER — ACETAMINOPHEN 650 MG RE SUPP: 650.0000 mg | Freq: Four times a day (QID) | RECTAL | Status: DC | PRN

## 2015-09-19 NOTE — ED Notes (Signed)
Pt reports he has been having black stools over the weekend with sob. Today pt went to pcp and had labs drawn, hgb was 7.7 Pt reports weakness.

## 2015-09-19 NOTE — Telephone Encounter (Signed)
Critical  Hemoglobin 7.7  HCT: 23.5
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
## 2015-09-19 NOTE — Telephone Encounter (Signed)
Advised MD of critical and verbal given to send patient to ER, Patient was advised and agreed to follow orders. Called ER and notified charge nurse of critical labs.
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
## 2015-09-20 DIAGNOSIS — N289 Disorder of kidney and ureter, unspecified: Secondary | ICD-10-CM

## 2015-09-20 DIAGNOSIS — R0600 Dyspnea, unspecified: Secondary | ICD-10-CM

## 2015-09-20 DIAGNOSIS — Z8673 Personal history of transient ischemic attack (TIA), and cerebral infarction without residual deficits: Secondary | ICD-10-CM

## 2015-09-20 DIAGNOSIS — I481 Persistent atrial fibrillation: Secondary | ICD-10-CM

## 2015-09-20 DIAGNOSIS — N189 Chronic kidney disease, unspecified: Secondary | ICD-10-CM

## 2015-09-20 DIAGNOSIS — K922 Gastrointestinal hemorrhage, unspecified: Secondary | ICD-10-CM

## 2015-09-20 LAB — CBC
HCT: 26.5 % — ABNORMAL LOW (ref 40.0–52.0)
HCT: 30.4 % — ABNORMAL LOW (ref 40.0–52.0)
HCT: 29.8 % — ABNORMAL LOW (ref 40.0–52.0)
HEMATOCRIT: 29.8 % — AB (ref 40.0–52.0)
Hemoglobin: 8.6 g/dL — ABNORMAL LOW (ref 13.0–18.0)
Hemoglobin: 9.9 g/dL — ABNORMAL LOW (ref 13.0–18.0)
Hemoglobin: 10 g/dL — ABNORMAL LOW (ref 13.0–18.0)
Hemoglobin: 10 g/dL — ABNORMAL LOW (ref 13.0–18.0)
MCH: 27.3 pg (ref 26.0–34.0)
MCH: 27.8 pg (ref 26.0–34.0)
MCH: 28.1 pg (ref 26.0–34.0)
MCH: 28.1 pg (ref 26.0–34.0)
MCHC: 32.6 g/dL (ref 32.0–36.0)
MCHC: 33.1 g/dL (ref 32.0–36.0)
MCHC: 33 g/dL (ref 32.0–36.0)
MCHC: 33.6 g/dL (ref 32.0–36.0)
MCV: 83.7 fL (ref 80.0–100.0)
MCV: 84 fL (ref 80.0–100.0)
MCV: 85.2 fL (ref 80.0–100.0)
MCV: 83.7 fL (ref 80.0–100.0)
PLATELETS: 222 10*3/uL (ref 150–440)
PLATELETS: 223 10*3/uL (ref 150–440)
PLATELETS: 227 10*3/uL (ref 150–440)
Platelets: 232 10*3/uL (ref 150–440)
RBC: 3.17 MIL/uL — AB (ref 4.40–5.90)
RBC: 3.55 MIL/uL — ABNORMAL LOW (ref 4.40–5.90)
RBC: 3.56 MIL/uL — ABNORMAL LOW (ref 4.40–5.90)
RBC: 3.56 MIL/uL — AB (ref 4.40–5.90)
RDW: 14.7 % — ABNORMAL HIGH (ref 11.5–14.5)
RDW: 14.7 % — AB (ref 11.5–14.5)
RDW: 14.8 % — AB (ref 11.5–14.5)
RDW: 14.7 % — ABNORMAL HIGH (ref 11.5–14.5)
WBC: 6.1 10*3/uL (ref 3.8–10.6)
WBC: 7.5 10*3/uL (ref 3.8–10.6)
WBC: 8.6 10*3/uL (ref 3.8–10.6)
WBC: 7 10*3/uL (ref 3.8–10.6)

## 2015-09-20 LAB — BASIC METABOLIC PANEL
Anion gap: 8 (ref 5–15)
BUN: 42 mg/dL — ABNORMAL HIGH (ref 6–20)
CALCIUM: 8.6 mg/dL — AB (ref 8.9–10.3)
CHLORIDE: 103 mmol/L (ref 101–111)
CO2: 23 mmol/L (ref 22–32)
CREATININE: 2.13 mg/dL — AB (ref 0.61–1.24)
GFR calc non Af Amer: 26 mL/min — ABNORMAL LOW (ref >60)
GFR, EST AFRICAN AMERICAN: 31 mL/min — AB (ref >60)
GLUCOSE: 205 mg/dL — AB (ref 65–99)
Potassium: 4.1 mmol/L (ref 3.5–5.1)
Sodium: 134 mmol/L — ABNORMAL LOW (ref 135–145)

## 2015-09-20 LAB — GLUCOSE, CAPILLARY
GLUCOSE-CAPILLARY: 258 mg/dL — AB (ref 65–99)
GLUCOSE-CAPILLARY: 223 mg/dL — AB (ref 65–99)
Glucose-Capillary: 198 mg/dL — ABNORMAL HIGH (ref 65–99)
Glucose-Capillary: 177 mg/dL — ABNORMAL HIGH (ref 65–99)
Glucose-Capillary: 267 mg/dL — ABNORMAL HIGH (ref 65–99)

## 2015-09-20 LAB — HEMOGLOBIN A1C: Hgb A1c MFr Bld: 6.8 % — ABNORMAL HIGH (ref 4.0–6.0)

## 2015-09-20 NOTE — Care Management (Signed)
Presents from home and has a hired caregiver approximatley 30 hours a week. Denies issues accessing medical care, obtaining medications or with transportation.  Current with  PCP- Dr Derrel Nip. Son Dr Grayland Jack is patient HCPOA.  At present patient is alert and oriented.  Uses a cane for safety when he is outside.  Admitted with  gib and is requiring 2 units of packed cells.  GI consult oin progress.  No discharge needs identified at present by care manager or members of care team
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
## 2015-09-20 NOTE — Progress Notes (Signed)
Spoke with D. Gouru okay to place order for clear liquids and NPO after midnight

## 2015-09-20 NOTE — Progress Notes (Signed)
Dr. Posey Pronto notified of patient's request for food and GI notes that they will not be doing any procedures tomorrow. New orders written. [name] K 09/20/2015

## 2015-09-20 NOTE — Progress Notes (Signed)
[facility] [name]    MR#:  [id number]  DATE OF BIRTH:  July 23, 1927  SUBJECTIVE:  CHIEF COMPLAINT:  Patient is resting comfortably. Hungry and wants to eat. Has received blood transfusion. Reporting some abdominal pain  REVIEW OF SYSTEMS:  CONSTITUTIONAL: No fever, fatigue or weakness.  EYES: No blurred or double vision.  EARS, NOSE, AND THROAT: No tinnitus or ear pain.  RESPIRATORY: No cough, shortness of breath, wheezing or hemoptysis.  CARDIOVASCULAR: No chest pain, orthopnea, edema.  GASTROINTESTINAL: No nausea, vomiting, diarrhea or abdominal pain.  GENITOURINARY: No dysuria, hematuria.  ENDOCRINE: No polyuria, nocturia,  HEMATOLOGY: No anemia, easy bruising or bleeding SKIN: No rash or lesion. MUSCULOSKELETAL: No joint pain or arthritis.   NEUROLOGIC: No tingling, numbness, weakness.  PSYCHIATRY: No anxiety or depression.   DRUG ALLERGIES:   Allergies  Allergen Reactions  . Asa [Aspirin] Other (See Comments)    Reaction:  Caused stomach ulcer   . Metoprolol Other (See Comments)    Reaction:  Bradycardia   . Penicillins Swelling and Other (See Comments)    Has patient had a PCN reaction causing immediate rash, facial/tongue/throat swelling, SOB or lightheadedness with hypotension: No Has patient had a PCN reaction causing severe rash involving mucus membranes or skin necrosis: No Has patient had a PCN reaction that required hospitalization No Has patient had a PCN reaction occurring within the last 10 years: No If all of the above answers are "NO", then may proceed with Cephalosporin use.    VITALS:  Blood pressure 159/75, pulse 63, temperature 98.1 F (36.7 C), temperature source Oral, resp. rate 20, height 5\' 10"  (1.778 m), weight 104.327 kg (230 lb), SpO2 98 %.  PHYSICAL EXAMINATION:  GENERAL:  82 y.o.-year-old patient lying in the bed with no acute distress.  EYES: Pupils equal, round,  reactive to light and accommodation. No scleral icterus. Extraocular muscles intact.  HEENT: Head atraumatic, normocephalic. Oropharynx and nasopharynx clear.  NECK:  Supple, no jugular venous distention. No thyroid enlargement, no tenderness.  LUNGS: Normal breath sounds bilaterally, no wheezing, rales,rhonchi or crepitation. No use of accessory muscles of respiration.  CARDIOVASCULAR: S1, S2 normal. No murmurs, rubs, or gallops.  ABDOMEN: Soft, epigastric tenderness, no rebound tenderness, nondistended. Bowel sounds present. No organomegaly or mass.  EXTREMITIES: No pedal edema, cyanosis, or clubbing.  NEUROLOGIC: Cranial nerves II through XII are intact. Muscle strength 5/5 in all extremities. Sensation intact. Gait not checked.  PSYCHIATRIC: The patient is alert and oriented x 3.  SKIN: No obvious rash, lesion, or ulcer.    LABORATORY PANEL:   CBC  Recent Labs Lab 09/20/15 1306  WBC 8.6  HGB 10.0*  HCT 30.4*  PLT 232   ------------------------------------------------------------------------------------------------------------------  Chemistries   Recent Labs Lab 09/19/15 1802 09/20/15 0103  NA 131* 134*  K 4.5 4.1  CL 102 103  CO2 22 23  GLUCOSE 195* 205*  BUN 44* 42*  CREATININE 2.07* 2.13*  CALCIUM 8.6* 8.6*  AST 25  --   ALT 15*  --   ALKPHOS 108  --   BILITOT 0.5  --    ------------------------------------------------------------------------------------------------------------------  Cardiac Enzymes  Recent Labs Lab 09/19/15 1802  TROPONINI <0.03   ------------------------------------------------------------------------------------------------------------------  RADIOLOGY:  No results found.  EKG:   Orders placed or performed during the hospital encounter of 09/19/15  . ED EKG  . ED EKG    ASSESSMENT AND PLAN:    82 year old Caucasian gentleman  history of chronic atrial fibrillation and recurrent GI bleeds presenting with black tarry  stools.  1. Symptomatic anemia secondary to upper GI bleed:  Hemoglobin 8.6-9.9 S/p  Transfusion of  2 unit packed red blood cells ,provided IV diuresis in between units, trend CBC every 6 hours, Protonix, GI consult appreciated. On liquid diet. Possible EGD in a.m. nothing by mouth after midnight.  2. Chronic atrial fibrillation: Acute setting will hold Eliquis  Mali score is at 8 and cardiology is recommending to restart Eliquis 2.5 mg by mouth twice a day from 09/21/2015 if  patient's hemoglobin hematocrit remained stable   3. Hyperlipidemia unspecified statin therapy  4. Type 2 diabetes insulin requiring: Hemoglobin A1c at 6.8 We'll decrease basal insulin to half dosage given nothing by mouth status overnight, currently on 25 units patient takes 50 units at home  5. Venous thromboembolism prophylactic: SCDs     All the records are reviewed and case discussed with Care Management/Social Workerr. Management plans discussed with the patient, family and they are in agreement.  CODE STATUS: fc  TOTAL TIME TAKING CARE OF THIS PATIENT: 35 minutes.   POSSIBLE D/C IN 1-2  DAYS, DEPENDING ON CLINICAL CONDITION.   Nicholes Mango M.D on 09/20/2015 at 2:37 PM  Between 7am to 6pm - Pager - [phone] After 6pm go to [email or web address] - password [email or web address] [facility]  Office  [phone]  CC: Primary care physician; [name], MD

## 2015-09-20 NOTE — Consult Note (Signed)
  Pt seen and examined. Please see J. Constance Haw. Recurrent GI bleeding on eliquis. Extensive GI w/u at [facility] with neg results. For echocardiogram next week. Possibility of being completely off eliquis depending on Echo results. No need to repeat any of the procedures at this time. If pt rebleeds off eliquis, then he can f/u with [facility] for further evaluation. Will sign off. Thanks.
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
## 2015-09-21 ENCOUNTER — Encounter: Payer: Self-pay | Admitting: Physician Assistant

## 2015-09-21 DIAGNOSIS — K922 Gastrointestinal hemorrhage, unspecified: Secondary | ICD-10-CM | POA: Insufficient documentation

## 2015-09-21 DIAGNOSIS — D649 Anemia, unspecified: Secondary | ICD-10-CM

## 2015-09-21 LAB — TYPE AND SCREEN
ABO/RH(D): O POS
Antibody Screen: NEGATIVE
UNIT DIVISION: 0
Unit division: 0

## 2015-09-21 LAB — CBC
HCT: 29.1 % — ABNORMAL LOW (ref 40.0–52.0)
HCT: 31.1 % — ABNORMAL LOW (ref 40.0–52.0)
Hemoglobin: 9.4 g/dL — ABNORMAL LOW (ref 13.0–18.0)
Hemoglobin: 10.3 g/dL — ABNORMAL LOW (ref 13.0–18.0)
MCH: 28 pg (ref 26.0–34.0)
MCH: 27.2 pg (ref 26.0–34.0)
MCHC: 33 g/dL (ref 32.0–36.0)
MCHC: 32.2 g/dL (ref 32.0–36.0)
MCV: 84.4 fL (ref 80.0–100.0)
MCV: 84.9 fL (ref 80.0–100.0)
PLATELETS: 208 10*3/uL (ref 150–440)
Platelets: 223 10*3/uL (ref 150–440)
RBC: 3.66 MIL/uL — ABNORMAL LOW (ref 4.40–5.90)
RBC: 3.45 MIL/uL — AB (ref 4.40–5.90)
RDW: 14.7 % — ABNORMAL HIGH (ref 11.5–14.5)
RDW: 15 % — ABNORMAL HIGH (ref 11.5–14.5)
WBC: 7.4 10*3/uL (ref 3.8–10.6)
WBC: 7.6 10*3/uL (ref 3.8–10.6)

## 2015-09-21 LAB — GLUCOSE, CAPILLARY
GLUCOSE-CAPILLARY: 377 mg/dL — AB (ref 65–99)
Glucose-Capillary: 299 mg/dL — ABNORMAL HIGH (ref 65–99)

## 2015-09-21 LAB — BASIC METABOLIC PANEL
Anion gap: 8 (ref 5–15)
BUN: 35 mg/dL — AB (ref 6–20)
CO2: 23 mmol/L (ref 22–32)
CREATININE: 1.85 mg/dL — AB (ref 0.61–1.24)
Calcium: 8.4 mg/dL — ABNORMAL LOW (ref 8.9–10.3)
Chloride: 101 mmol/L (ref 101–111)
GFR calc Af Amer: 36 mL/min — ABNORMAL LOW (ref >60)
GFR, EST NON AFRICAN AMERICAN: 31 mL/min — AB (ref >60)
Glucose, Bld: 314 mg/dL — ABNORMAL HIGH (ref 65–99)
POTASSIUM: 4.1 mmol/L (ref 3.5–5.1)
SODIUM: 132 mmol/L — AB (ref 135–145)

## 2015-09-21 MED ORDER — INSULIN ASPART 100 UNIT/ML ~~LOC~~ SOLN
0.0000 [IU] | Freq: Three times a day (TID) | SUBCUTANEOUS | Status: DC
  Administered 2015-09-21: 5 [IU] via SUBCUTANEOUS
  Filled 2015-09-21: qty 5

## 2015-09-21 MED ORDER — FUROSEMIDE 40 MG PO TABS: 20.0000 mg | ORAL_TABLET | Freq: Every day | ORAL | Status: DC

## 2015-09-21 MED ORDER — INSULIN ASPART 100 UNIT/ML ~~LOC~~ SOLN: 3.0000 [IU] | Freq: Three times a day (TID) | SUBCUTANEOUS | Status: DC

## 2015-09-21 MED ORDER — ACETAMINOPHEN 325 MG PO TABS
650.0000 mg | ORAL_TABLET | Freq: Four times a day (QID) | ORAL | Status: AC | PRN
Start: 2015-09-21 — End: ?

## 2015-09-21 MED ORDER — INSULIN GLARGINE 100 UNIT/ML ~~LOC~~ SOLN
10.0000 [IU] | Freq: Once | SUBCUTANEOUS | Status: AC
  Administered 2015-09-21: 10 [IU] via SUBCUTANEOUS
  Filled 2015-09-21 (×2): qty 0.1

## 2015-09-21 MED ORDER — OXYCODONE HCL 5 MG PO TABS: 5.0000 mg | ORAL_TABLET | ORAL | Status: DC | PRN

## 2015-09-21 MED ORDER — INSULIN ASPART 100 UNIT/ML ~~LOC~~ SOLN: 0.0000 [IU] | Freq: Every day | SUBCUTANEOUS | Status: DC

## 2015-09-21 MED ORDER — APIXABAN 2.5 MG PO TABS: 2.5000 mg | ORAL_TABLET | Freq: Two times a day (BID) | ORAL | Status: DC

## 2015-09-21 MED ORDER — FUROSEMIDE 10 MG/ML IJ SOLN
20.0000 mg | Freq: Once | INTRAMUSCULAR | Status: AC
  Administered 2015-09-21: 20 mg via INTRAVENOUS
  Filled 2015-09-21: qty 2

## 2015-09-21 MED ORDER — APIXABAN 2.5 MG PO TABS
2.5000 mg | ORAL_TABLET | Freq: Two times a day (BID) | ORAL | Status: DC
  Administered 2015-09-21: 2.5 mg via ORAL
  Filled 2015-09-21: qty 1

## 2015-09-21 MED ORDER — INSULIN ASPART 100 UNIT/ML ~~LOC~~ SOLN
3.0000 [IU] | Freq: Three times a day (TID) | SUBCUTANEOUS | Status: DC
  Filled 2015-09-21: qty 3

## 2015-09-21 MED ORDER — INSULIN GLARGINE 100 UNIT/ML ~~LOC~~ SOLN
40.0000 [IU] | Freq: Every day | SUBCUTANEOUS | Status: DC
Start: 2015-09-21 — End: 2015-09-21
  Filled 2015-09-21: qty 0.4

## 2015-09-21 NOTE — Progress Notes (Signed)
Patient: [name] / Admit Date: 09/19/2015 / Date of Encounter: 09/21/2015, 8:44 AM   Subjective: No acute overnight events. Feeling well this morning. HGB 10-->10-->9.4 this morning. GI has seen patient and does not plan to perform any invasive studies at this time.   Review of Systems: Review of Systems  Constitutional: Positive for malaise/fatigue. Negative for fever, chills, weight loss and diaphoresis.  HENT: Negative for congestion.   Eyes: Negative for discharge and redness.  Respiratory: Positive for shortness of breath. Negative for cough, hemoptysis, sputum production and wheezing.   Cardiovascular: Positive for leg swelling. Negative for chest pain, palpitations, orthopnea, claudication and PND.  Gastrointestinal: Negative for nausea and vomiting.  Musculoskeletal: Negative for falls.  Skin: Negative for rash.  Neurological: Negative for dizziness, sensory change, speech change, focal weakness and weakness.  Endo/Heme/Allergies: Does not bruise/bleed easily.  Psychiatric/Behavioral: The patient is not nervous/anxious.   All other systems reviewed and are negative.   Objective: Telemetry: Afib, 80's, occasional PVCs Physical Exam: Blood pressure 151/70, pulse 84, temperature 98.8 F (37.1 C), temperature source Oral, resp. rate 20, height 5\' 10"  (1.778 m), weight 230 lb (104.327 kg), SpO2 96 %. Body mass index is 33 kg/(m^2). General: Well developed, well nourished, in no acute distress. Head: Normocephalic, atraumatic, sclera non-icteric, no xanthomas, nares are without discharge. Neck: Negative for carotid bruits. JVP not elevated. Lungs: Clear bilaterally to auscultation without wheezes, rales, or rhonchi. Breathing is unlabored. Heart: RRR S1 S2 without murmurs, rubs, or gallops.  Abdomen: Obese, soft, non-tender, non-distended with normoactive bowel sounds. No rebound/guarding. Extremities: No clubbing or cyanosis. Trace pre-tibial edema. Distal pedal pulses  are 2+ and equal bilaterally. Neuro: Alert and oriented X 3. Moves all extremities spontaneously. Psych:  Responds to questions appropriately with a normal affect.   Intake/Output Summary (Last 24 hours) at 09/21/15 0844 Last data filed at 09/21/15 0843  Gross per 24 hour  Intake      0 ml  Output   1150 ml  Net  -1150 ml    Inpatient Medications:  . amLODipine  5 mg Oral Daily  . apixaban  2.5 mg Oral BID  . calcitRIOL  0.25 mcg Oral Q M,W,F  . carvedilol  6.25 mg Oral BID  . cholecalciferol  1,000 Units Oral Daily  . escitalopram  10 mg Oral QHS  . ferrous sulfate  325 mg Oral Q breakfast  . furosemide  20 mg Intravenous Once  . [START ON 09/22/2015] furosemide  20 mg Oral Daily  . hydroxychloroquine  200 mg Oral Daily  . insulin aspart  0-5 Units Subcutaneous QHS  . insulin aspart  0-9 Units Subcutaneous TID WC  . insulin glargine  25 Units Subcutaneous QHS  . isosorbide mononitrate  30 mg Oral Daily  . multivitamin with minerals  1 tablet Oral Daily  . multivitamin-lutein  2 capsule Oral BID  . pantoprazole (PROTONIX) IV  40 mg Intravenous Q12H  . tamsulosin  0.4 mg Oral QPC supper  . vitamin C  500 mg Oral Daily   Infusions:    Labs:  Recent Labs  09/20/15 0103 09/21/15 0056  NA 134* 132*  K 4.1 4.1  CL 103 101  CO2 23 23  GLUCOSE 205* 314*  BUN 42* 35*  CREATININE 2.13* 1.85*  CALCIUM 8.6* 8.4*    Recent Labs  09/19/15 1802  AST 25  ALT 15*  ALKPHOS 108  BILITOT 0.5  PROT 6.7  ALBUMIN 3.6  Recent Labs  09/19/15 1430 09/19/15 1802  09/20/15 2004 09/21/15 0056  WBC 6.3 5.5  < > 7.0 7.4  NEUTROABS 4.6 3.9  --   --   --   HGB 7.7 Repeated and verified X2.* 7.5*  < > 10.0* 9.4*  HCT 23.5 Repeated and verified X2.* 22.8*  < > 29.8* 29.1*  MCV 84.8 84.9  < > 83.7 84.4  PLT 239.0 226  < > 227 208  < > = values in this interval not displayed.  Recent Labs  09/19/15 1802  TROPONINI <0.03   Invalid input(s): POCBNP  Recent Labs   09/20/15 0103  HGBA1C 6.8*     Weights: Filed Weights   09/19/15 1743  Weight: 230 lb (104.327 kg)     Radiology/Studies:  No results found.   Assessment and Plan   1. Chronic Afib s/p Watchman device: -Currently rate controlled with HR in the 80's bpm -Continue Coreg 6.25 mg bid -Eliquis has been held since his arrival to the ED in the evening on 4/11. Case with discussed by Dr. Rockey Situ, MD with Dr. Rayann Heman (pricipal investigator with Watchman program for [facility]) -AAfter case was discussed with Watchman team Eliquis was held on 4/12 to assess for stability in HGB. If found to be stable he could then resume Eliquis 2.5 mg on 4/13 -HGB trend since consult 10-->10-->9.4 drawn at 12:56 AM and resulted at 1:15 AM -Given this slight dip in HGB this morning we will draw stat CBC to assess for stability in HGB vs downward trend. If HGB is found to be stable he would be ok to resume Eliquis 2.5 mg bid today and likely be discharged; however if there is a continued downward trend into the mid 8 range he would likely need further inpatient evaluation/treatment -He will continue with planned TEE on 4/19 with Watchman investigating team  -Long term anticoagulation/antiplatelet therapy to be dictated by Watchman team -CHADS2VASc at least 8 (CHF, HTN, age x 2, DM, stroke x 2, vascular disease)  2. Recurrent GI bleed: -Most recent episode was isolated on 4/7 leading to a drop in HGB to 7.5, s/p 2 units of pRBC, HGB of 9.9 s/p 2 units of pRBC upon admission. HGB trend since has been 10-->10-->9.4 this morning as above -Check stat HGB as above -No further episodes of melena, has had BM since -GI without plans for invasive evaluation   3. CAD s/p CABG as above: -No symptoms of angina -No plans for ischemic evaluation at this time -On Eliquis in place on aspirin  -Coreg as above -Imdur 30 mg daily  4. Chronic diastolic CHF: -Trace pre-tibial edema -IV Lasix x 1 today -Coreg as  above  5. Acute on chronic microcytic anemia: -Iron deficiency in etiology from blood loss -Improved s/p 2 units of pRBC -Monitor for stability -On Ferous sulfate  -Per IM/GI  6. History of bioprosthetic valve: -Stable on most recent echo  7. History of stroke: -Occurred while off anticoagulation 2/2 GI bleed -Patient reports he would rather require transfusions than be at increased risk for repeat stroke -Residual left-handed weakness -On Eliquis as above  8. SOB: -Likely in the setting of #5 -Improved s/p transfusion x 2 of pRBC  Signed, Christell Faith, PA-C Pager: [phone] 09/21/2015, 8:44 AM    Attending Note Patient seen and examined, agree with detailed note above,  Patient presentation and plan discussed on rounds.   Patient denies any melena or abdominal discomfort Improvement in his shortness of breath,  denies leg edema Would like to go home if possible Hemoglobin last night 9.4, slight drop from 10 Labs from this morning is pending. Currently off his anticoagulation 36 hours  Clinical exam, lungs are relatively clear, heart rate irregular reasonable rate, abdomen soft, nontender, trace pitting edema to the low shins.  ----Recommend repeat blood count this morning, If stable would restart eliquis  2.5 mg twice a day and consider discharge  if there is a significant decline in blood count, would hold off on starting anticoagulation and may need to be monitored an additional day .  For his mild shortness of breath, leg edema consistent with acute on chronic diastolic CHF, will give IV Lasix 20 mg 1 this morning. Would recommended he continue Lasix 20 mg daily at home  Details discussed with him    Greater than 50% was spent in counseling and coordination of care with patient Total encounter time 25 minutes or more   Signed: Esmond Plants  M.D., Ph.D. [facility]

## 2015-09-21 NOTE — Discharge Summary (Addendum)
[place] [facility] [place] [name]    MR#:  [id number]  DATE OF BIRTH:  [birth year]  DATE OF ADMISSION:  09/19/2015 ADMITTING PHYSICIAN: Lytle Butte, MD  DATE OF DISCHARGE: 09/21/15  PRIMARY CARE PHYSICIAN: [name], MD    ADMISSION DIAGNOSIS:  Dyspnea [R06.00] Acute on chronic renal insufficiency (HCC) [N28.9, N18.9] Anemia, unspecified anemia type [D64.9] Gastrointestinal hemorrhage, unspecified gastritis, unspecified gastrointestinal hemorrhage type [K92.2]  DISCHARGE DIAGNOSIS:  Principal Problem:   Upper GI bleed Active Problems:   Acute on chronic renal insufficiency (HCC)   Dyspnea   History of CVA (cerebrovascular accident)   Absolute anemia   Bleeding gastrointestinal  chronic atrial fibrillation   SECONDARY DIAGNOSIS:   Past Medical History  Diagnosis Date  . Aortic stenosis, severe     a. s/p bioprosthetic aortic valve replacement in 2009  . Hypertension   . Diabetes mellitus   . OA (osteoarthritis)   . Junctional bradycardia   . BPH (benign prostatic hypertrophy)   . Hyperlipidemia   . Anemia   . Gastrointestinal bleed     a. reccurent GIB  . CAD (coronary artery disease)     a. s/p CABG x 1 in 2009  . Mobitz type 1 second degree atrioventricular block 10/01/2012  . History of bladder cancer   . Macular degeneration   . CVA (cerebral infarction) 06/2014    Right MCA infarct  . Chronic diastolic (congestive) heart failure ([place])   . Permanent atrial fibrillation ([place])     a. s/p Watchman device 08/10/2015; b. on Eliquis    HOSPITAL COURSE:   82 year old Caucasian gentleman history of chronic atrial fibrillation and recurrent GI bleeds presenting with black tarry stools.  1. Symptomatic anemia secondary to upper GI bleed:  Hemoglobin 8.6-9.9- 9.4--10.3 today S/p Transfusion of 2 unit packed red blood cells ,provided IV diuresis in between units, trended CBC every 6 hours, Protonix  given  GI consult appreciated. Advanced diet as tolerated . GI is recommending the patient to follow-up with [facility] if he starts bleeding again. No procedures done during this hospital course  2. Chronic atrial fibrillation: Acute setting will hold Eliquis  Mali score is at 8 and cardiology is recommending to restart Eliquis 2.5 mg by mouth twice a day from 09/21/2015 if patient's hemoglobin hematocrit remained stable follow-up with cardiology Dr. altered as recommended on next Wednesday to get TEE  3. Hyperlipidemia unspecified statin therapy  4. Type 2 diabetes insulin requiring: Hemoglobin A1c at 6.8  Resume home dose insulin patient takes 50 units at home  5. Venous thromboembolism prophylactic: SCDs  DISCHARGE CONDITIONS:  Fair   CONSULTS OBTAINED:  Treatment Team:  Lytle Butte, MD Minna Merritts, MD   PROCEDURES none  DRUG ALLERGIES:   Allergies  Allergen Reactions  . Asa [Aspirin] Other (See Comments)    Reaction:  Caused stomach ulcer   . Metoprolol Other (See Comments)    Reaction:  Bradycardia   . Penicillins Swelling and Other (See Comments)    Has patient had a PCN reaction causing immediate rash, facial/tongue/throat swelling, SOB or lightheadedness with hypotension: No Has patient had a PCN reaction causing severe rash involving mucus membranes or skin necrosis: No Has patient had a PCN reaction that required hospitalization No Has patient had a PCN reaction occurring within the last 10 years: No If all of the above answers are "NO", then may proceed with Cephalosporin use.  DISCHARGE MEDICATIONS:   Current Discharge Medication List    CONTINUE these medications which have NOT CHANGED   Details  amLODipine (NORVASC) 5 MG tablet Take 5 mg by mouth daily.    apixaban (ELIQUIS) 2.5 MG TABS tablet Take 1 tablet (2.5 mg total) by mouth 2 (two) times daily. Qty: 60 tablet, Refills: 1    calcitRIOL (ROCALTROL) 0.25 MCG capsule  Take 0.25 mcg by mouth every Monday, Wednesday, and Friday.    carvedilol (COREG) 6.25 MG tablet Take 6.25 mg by mouth 2 (two) times daily.    cholecalciferol (VITAMIN D) 1000 units tablet Take 1,000 Units by mouth daily.    docusate sodium (COLACE) 100 MG capsule Take 100 mg by mouth daily.    escitalopram (LEXAPRO) 10 MG tablet Take 10 mg by mouth at bedtime.    ferrous sulfate 325 (65 FE) MG tablet Take 325 mg by mouth daily with breakfast.    furosemide (LASIX) 20 MG tablet Take 20 mg by mouth daily.    hydroxychloroquine (PLAQUENIL) 200 MG tablet Take 200 mg by mouth daily.    insulin aspart (NOVOLOG) 100 UNIT/ML injection Inject 3-7 Units into the skin 3 (three) times daily with meals as needed for high blood sugar. Pt uses as needed per sliding scale:    100-150:  3 units  151-200:  4 units  201-250:  5 units  Greater than 250:  7 units    insulin glargine (LANTUS) 100 UNIT/ML injection Inject 50 Units into the skin daily.    isosorbide mononitrate (IMDUR) 30 MG 24 hr tablet Take 1 tablet (30 mg total) by mouth daily. Qty: 30 tablet, Refills: 5    Multiple Vitamin (MULTIVITAMIN WITH MINERALS) TABS tablet Take 1 tablet by mouth daily.    Multiple Vitamins-Minerals (PRESERVISION AREDS 2) CAPS Take 1 capsule by mouth 2 (two) times daily.    pantoprazole (PROTONIX) 40 MG tablet Take 40 mg by mouth daily.    tamsulosin (FLOMAX) 0.4 MG CAPS capsule Take 0.4 mg by mouth daily after supper.    vitamin C (ASCORBIC ACID) 500 MG tablet Take 500 mg by mouth daily.         DISCHARGE INSTRUCTIONS:  Follow-up with primary care physician in a week [facility] Follow-[facility] cardiology Dr. Rayann Heman as scheduled on next Wednesday   DIET:  Cardiac diet ,diabetic  DISCHARGE CONDITION:  Fair  ACTIVITY:  Activity as tolerated per home PT  OXYGEN:  Home Oxygen: No.   Oxygen Delivery: room air  DISCHARGE LOCATION:  home   If  you experience worsening of your admission symptoms, develop shortness of breath, life threatening emergency, suicidal or homicidal thoughts you must seek medical attention immediately by calling 911 or calling your MD immediately  if symptoms less severe.  You Must read complete instructions/literature along with all the possible adverse reactions/side effects for all the Medicines you take and that have been prescribed to you. Take any new Medicines after you have completely understood and accpet all the possible adverse reactions/side effects.   Please note  You were cared for by a hospitalist during your hospital stay. If you have any questions about your discharge medications or the care you received while you were in the hospital after you are discharged, you can call the unit and asked to speak with the hospitalist on call if the hospitalist that took care of you is not available. Once you are discharged, your primary care physician will handle  any further medical issues. Please note that NO REFILLS for any discharge medications will be authorized once you are discharged, as it is imperative that you return to your primary care physician (or establish a relationship with a primary care physician if you do not have one) for your aftercare needs so that they can reassess your need for medications and monitor your lab values.     Today  Chief Complaint  Patient presents with  . GI Bleeding   Patient is resting comfortably. Denies any more episodes of bleeding. Wants to go home. Denies any abdominal pain. Cardiology is recommending to resume Eliquis. Discussed with caregiver Manuela Schwartz at bedside and patient and Manuela Schwartz are agreeable with the plan  ROS:  CONSTITUTIONAL: Denies fevers, chills. Denies any fatigue, weakness.  EYES: Denies blurry vision, double vision, eye pain. EARS, NOSE, THROAT: Denies tinnitus, ear pain, hearing loss. RESPIRATORY: Denies cough, wheeze, shortness of breath.   CARDIOVASCULAR: Denies chest pain, palpitations, edema.  GASTROINTESTINAL: Denies nausea, vomiting, diarrhea, abdominal pain. Denies bright red blood per rectum. GENITOURINARY: Denies dysuria, hematuria. ENDOCRINE: Denies nocturia or thyroid problems. HEMATOLOGIC AND LYMPHATIC: Denies easy bruising or bleeding. SKIN: Denies rash or lesion. MUSCULOSKELETAL: Denies pain in neck, back, shoulder, knees, hips or arthritic symptoms.  NEUROLOGIC: Denies paralysis, paresthesias.  PSYCHIATRIC: Denies anxiety or depressive symptoms.   VITAL SIGNS:  Blood pressure 126/49, pulse 87, temperature 98.2 F (36.8 C), temperature source Oral, resp. rate 17, height 5\' 10"  (1.778 m), weight 104.327 kg (230 lb), SpO2 99 %.  I/O:    Intake/Output Summary (Last 24 hours) at 09/21/15 1300 Last data filed at 09/21/15 1142  Gross per 24 hour  Intake    240 ml  Output   1550 ml  Net  -1310 ml    PHYSICAL EXAMINATION:  GENERAL:  82 y.o.-year-old patient lying in the bed with no acute distress.  EYES: Pupils equal, round, reactive to light and accommodation. No scleral icterus. Extraocular muscles intact.  HEENT: Head atraumatic, normocephalic. Oropharynx and nasopharynx clear.  NECK:  Supple, no jugular venous distention. No thyroid enlargement, no tenderness.  LUNGS: Normal breath sounds bilaterally, no wheezing, rales,rhonchi or crepitation. No use of accessory muscles of respiration.  CARDIOVASCULAR:   Irregularly irregular ,no murmurs, rubs, or gallops.  ABDOMEN: Soft, non-tender, non-distended. Bowel sounds present. No organomegaly or mass.  EXTREMITIES: No pedal edema, cyanosis, or clubbing.  NEUROLOGIC: Cranial nerves II through XII are intact. Muscle strength 5/5 in all extremities. Sensation intact. Gait not checked.  PSYCHIATRIC: The patient is alert and oriented x 3.  SKIN: No obvious rash, lesion, or ulcer.   DATA REVIEW:   CBC  Recent Labs Lab 09/21/15 0854  WBC 7.6  HGB 10.3*  HCT  31.1*  PLT 223    Chemistries   Recent Labs Lab 09/19/15 1802  09/21/15 0056  NA 131*  < > 132*  K 4.5  < > 4.1  CL 102  < > 101  CO2 22  < > 23  GLUCOSE 195*  < > 314*  BUN 44*  < > 35*  CREATININE 2.07*  < > 1.85*  CALCIUM 8.6*  < > 8.4*  AST 25  --   --   ALT 15*  --   --   ALKPHOS 108  --   --   BILITOT 0.5  --   --   < > = values in this interval not displayed.  Cardiac Enzymes  Recent Labs Lab 09/19/15 1802  TROPONINI <  0.03    Microbiology Results  Results for orders placed or performed during the hospital encounter of 08/10/15  MRSA PCR Screening     Status: None   Collection Time: 08/10/15 12:58 PM  Result Value Ref Range Status   MRSA by PCR NEGATIVE NEGATIVE Final    Comment:        The GeneXpert MRSA Assay (FDA approved for NASAL specimens only), is one component of a comprehensive MRSA colonization surveillance program. It is not intended to diagnose MRSA infection nor to guide or monitor treatment for MRSA infections.     RADIOLOGY:  No results found.  EKG:   Orders placed or performed during the hospital encounter of 09/19/15  . ED EKG  . ED EKG      Management plans discussed with the patient, family and they are in agreement.  CODE STATUS:     Code Status Orders        Start     Ordered   09/19/15 1927  Full code   Continuous     09/19/15 1927    Code Status History    Date Active Date Inactive Code Status Order ID Comments User Context   07/28/2015  6:39 PM 07/31/2015  4:57 PM Full Code [id number]  [name] Costa, MD ED   07/10/2015 11:37 PM 07/12/2015 11:15 PM Full Code [id number]  Rise Patience, MD Inpatient   04/12/2015  7:05 PM 04/17/2015  7:10 PM Full Code [id number]  Geradine Girt, DO Inpatient   06/29/2014 12:24 PM 07/09/2014  2:15 PM Full Code [id number]  Bary Leriche, PA-C Inpatient    Advance Directive Documentation        Most Recent Value   Type of Advance Directive  Healthcare Power of Attorney, Living will    Pre-existing out of facility DNR order (yellow form or pink MOST form)     "MOST" Form in Place?        TOTAL TIME TAKING CARE OF THIS PATIENT: 45  minutes.    @[facility] @  on 09/21/2015 at 1:00 PM  Between 7am to 6pm - Pager - [phone]  After 6pm go to [email or web address] - password [email or web address] [facility]  Office  [phone]  CC: Primary care physician; [name], MD

## 2015-09-21 NOTE — Progress Notes (Signed)
Inpatient Diabetes Program Recommendations  AACE/ADA: New Consensus Statement on Inpatient Glycemic Control (2015)  Target Ranges:  Prepandial:   less than 140 mg/dL      Peak postprandial:   less than 180 mg/dL (1-2 hours)      Critically ill patients:  140 - 180 mg/dL   Review of Glycemic Control  Results for SHINE, ANTAL (MRN [id number]) as of 09/21/2015 09:18  Ref. Range 09/20/2015 07:25 09/20/2015 11:34 09/20/2015 16:31 09/20/2015 21:23 09/21/2015 07:35  Glucose-Capillary Latest Ref Range: 65-99 mg/dL 177 (H) 267 (H) 258 (H) 223 (H) 377 (H)    Diabetes history: Type 2 diabetes Outpatient Diabetes medications: Lantus 50 units daily, Novolog 3-7 units tid with meals Current orders for Inpatient glycemic control: Lantus 25 units daily, Novolog sensitive correction 0-9 units tid, Novolog 0-5 units qhs  Inpatient Diabetes Program Recommendations:   Blood sugars are elevated because he did not have basal insulin since he's been admitted. He has poor renal function and will not tolerate large doses of Novolog insulin.   Consider increasing Lantus to 40 units qhs- consider giving 10 units now then start Lantus 40 units tonight   Consider ordering Novolog meal coverage 3 units tid with meals so he gets consistent mealtime insulin (hold if he eats less than 50%)  and continue Novolog correction as ordered.  Gentry Fitz, RN, BA, MHA, CDE Diabetes Coordinator Inpatient Diabetes Program  [phone] (Team Pager) [phone] ([place]) 09/21/2015 9:33 AM

## 2015-09-21 NOTE — Clinical Documentation Improvement (Signed)
  Internal Medicine  Can the diagnosis of "acute on chronic renal insufficiency" be further specified?  Thank you   Acute Renal Failure/Acute Kidney Injury  Acute Renal Failure on Chronic CKD (specify stage I-IV)  Acute on Chronic Renal Failure  Chronic Renal Failure  Other  Clinically Undetermined  Document any associated diagnoses/conditions.   Supporting Information: Anemia, recent surg procedure, Melena, HTN, Chronic Diastolic Heart Failure  BUN  BUN 6 - 20 mg/dL 35 (H) 42 (H) 44 (H       Creatinine, Ser 0.61 - 1.24 mg/dL 1.85 (H) 2.13 (H) 2.07 (        IV NS/ CMET/BMET daily    Please exercise your independent, professional judgment when responding. A specific answer is not anticipated or expected.   Thank You,  [facility] [phone]

## 2015-09-21 NOTE — Care Management (Signed)
[facility] open with [facility] PT and OT.  Resumption of care orders written for.  Corene Cornea with [facility] notified of discharge order.

## 2015-09-21 NOTE — Progress Notes (Signed)
IV and tele were removed. Discharge instructions and  follow-up appointmentswere provided to the pt. The pt refused to wait for the MD to sign the Oxycodone RX. Pt was advised to call Primary MD if pt needs narcotic pain medication.  \

## 2015-09-21 NOTE — Clinical Documentation Improvement (Addendum)
Internal Medicine  Abnormal Lab/Test Results:  Sodium 131 /134 / 132  Possible Clinical Conditions associated with below indicators    Hyponatremia  SIADH  Other Condition  Cannot Clinically Determine   Supporting Information: Anemia, melena, Acute on Chronic renal insufficiency  Treatment Provided: CMET/BMP daily / IV NS    Please exercise your independent, professional judgment when responding. A specific answer is not anticipated or expected.   Thank You,  [place] [phone]

## 2015-09-21 NOTE — Care Management Important Message (Signed)
Important Message  Patient Details  Name: NEWELL OLANDER MRN: [id number] Date of Birth: 07/07/1927   Medicare Important Message Given:       Juliann Pulse A Noemy Hallmon 09/21/2015, 11:26 AM

## 2015-09-21 NOTE — Discharge Instructions (Signed)
Follow-up with primary care physician in a week [facility] needed Follow-up with [facility] cardiology Dr. Rayann Heman as scheduled on next Wednesday Diet cardiac and diabetic Activity as tolerated as recommended by home health PT

## 2015-09-23 DIAGNOSIS — I5032 Chronic diastolic (congestive) heart failure: Secondary | ICD-10-CM | POA: Diagnosis not present

## 2015-09-23 DIAGNOSIS — K922 Gastrointestinal hemorrhage, unspecified: Secondary | ICD-10-CM | POA: Diagnosis not present

## 2015-09-23 DIAGNOSIS — I130 Hypertensive heart and chronic kidney disease with heart failure and stage 1 through stage 4 chronic kidney disease, or unspecified chronic kidney disease: Secondary | ICD-10-CM | POA: Diagnosis not present

## 2015-09-23 DIAGNOSIS — E1142 Type 2 diabetes mellitus with diabetic polyneuropathy: Secondary | ICD-10-CM | POA: Diagnosis not present

## 2015-09-23 DIAGNOSIS — N183 Chronic kidney disease, stage 3 (moderate): Secondary | ICD-10-CM | POA: Diagnosis not present

## 2015-09-23 DIAGNOSIS — E1122 Type 2 diabetes mellitus with diabetic chronic kidney disease: Secondary | ICD-10-CM | POA: Diagnosis not present

## 2015-09-26 ENCOUNTER — Other Ambulatory Visit: Payer: Self-pay | Admitting: Nurse Practitioner

## 2015-09-27 ENCOUNTER — Ambulatory Visit (HOSPITAL_BASED_OUTPATIENT_CLINIC_OR_DEPARTMENT_OTHER): Payer: Medicare Other

## 2015-09-27 ENCOUNTER — Encounter (HOSPITAL_COMMUNITY): Payer: Self-pay | Admitting: *Deleted

## 2015-09-27 ENCOUNTER — Encounter (HOSPITAL_COMMUNITY)
Admission: RE | Disposition: A | Discharge status: Home / Self Care | Payer: Self-pay | Source: Ambulatory Visit | Attending: Cardiovascular Disease

## 2015-09-27 ENCOUNTER — Ambulatory Visit (HOSPITAL_COMMUNITY)
Admission: RE | Admit: 2015-09-27 | Discharge: 2015-09-27 | Disposition: A | Discharge status: Home / Self Care | Payer: Medicare Other | Source: Ambulatory Visit | Attending: Cardiovascular Disease | Admitting: Cardiovascular Disease

## 2015-09-27 DIAGNOSIS — Z7901 Long term (current) use of anticoagulants: Secondary | ICD-10-CM | POA: Diagnosis not present

## 2015-09-27 DIAGNOSIS — Z955 Presence of coronary angioplasty implant and graft: Secondary | ICD-10-CM | POA: Diagnosis not present

## 2015-09-27 DIAGNOSIS — I482 Chronic atrial fibrillation: Secondary | ICD-10-CM | POA: Diagnosis not present

## 2015-09-27 DIAGNOSIS — I2510 Atherosclerotic heart disease of native coronary artery without angina pectoris: Secondary | ICD-10-CM | POA: Diagnosis not present

## 2015-09-27 DIAGNOSIS — I5032 Chronic diastolic (congestive) heart failure: Secondary | ICD-10-CM | POA: Diagnosis not present

## 2015-09-27 DIAGNOSIS — Z954 Presence of other heart-valve replacement: Secondary | ICD-10-CM | POA: Insufficient documentation

## 2015-09-27 DIAGNOSIS — D509 Iron deficiency anemia, unspecified: Secondary | ICD-10-CM | POA: Insufficient documentation

## 2015-09-27 DIAGNOSIS — I110 Hypertensive heart disease with heart failure: Secondary | ICD-10-CM | POA: Insufficient documentation

## 2015-09-27 DIAGNOSIS — Z79899 Other long term (current) drug therapy: Secondary | ICD-10-CM | POA: Insufficient documentation

## 2015-09-27 DIAGNOSIS — I4891 Unspecified atrial fibrillation: Secondary | ICD-10-CM | POA: Diagnosis not present

## 2015-09-27 DIAGNOSIS — Z7902 Long term (current) use of antithrombotics/antiplatelets: Secondary | ICD-10-CM | POA: Diagnosis not present

## 2015-09-27 DIAGNOSIS — Z8673 Personal history of transient ischemic attack (TIA), and cerebral infarction without residual deficits: Secondary | ICD-10-CM | POA: Diagnosis not present

## 2015-09-27 HISTORY — PX: TEE WITHOUT CARDIOVERSION: SHX5443

## 2015-09-27 LAB — GLUCOSE, CAPILLARY
Glucose-Capillary: 78 mg/dL (ref 65–99)
Glucose-Capillary: 87 mg/dL (ref 65–99)

## 2015-09-27 SURGERY — ECHOCARDIOGRAM, TRANSESOPHAGEAL
Anesthesia: Moderate Sedation

## 2015-09-27 MED ORDER — FENTANYL CITRATE (PF) 100 MCG/2ML IJ SOLN
INTRAMUSCULAR | Status: AC
  Filled 2015-09-27: qty 2

## 2015-09-27 MED ORDER — MIDAZOLAM HCL 5 MG/ML IJ SOLN
INTRAMUSCULAR | Status: AC
  Filled 2015-09-27: qty 2

## 2015-09-27 MED ORDER — FENTANYL CITRATE (PF) 100 MCG/2ML IJ SOLN
INTRAMUSCULAR | Status: DC | PRN
  Administered 2015-09-27 (×2): 25 ug via INTRAVENOUS

## 2015-09-27 MED ORDER — SODIUM CHLORIDE 0.9 % IV SOLN
INTRAVENOUS | Status: DC
  Administered 2015-09-27: 500 mL via INTRAVENOUS

## 2015-09-27 MED ORDER — BUTAMBEN-TETRACAINE-BENZOCAINE 2-2-14 % EX AERO
INHALATION_SPRAY | CUTANEOUS | Status: DC | PRN
  Administered 2015-09-27: 2 via TOPICAL

## 2015-09-27 MED ORDER — MIDAZOLAM HCL 10 MG/2ML IJ SOLN
INTRAMUSCULAR | Status: DC | PRN
  Administered 2015-09-27: 1 mg via INTRAVENOUS
  Administered 2015-09-27: 2 mg via INTRAVENOUS

## 2015-09-27 NOTE — Progress Notes (Signed)
Echocardiogram Echocardiogram Transesophageal has been performed.  [name] 09/27/2015, 10:16 AM

## 2015-09-27 NOTE — Discharge Instructions (Signed)
Transesophageal Echocardiogram Transesophageal echocardiography (TEE) is a picture test of your heart using sound waves. The pictures taken can give very detailed pictures of your heart. This can help your doctor see if there are problems with your heart. TEE can check:  If your heart has blood clots in it.  How well your heart valves are working.  If you have an infection on the inside of your heart.  Some of the major arteries of your heart.  If your heart valve is working after a Office manager.  Your heart before a procedure that uses a shock to your heart to get the rhythm back to normal. AFTER THE PROCEDURE  You will be taken to a recovery area so the sedative can wear off.  Your throat may be sore and scratchy. This will go away slowly over time.  You will go home when you are fully awake and able to swallow liquids.  You should have someone stay with you for the next 24 hours.  Do not drive or operate machinery for the next 24 hours.   This information is not intended to replace advice given to you by your health care provider. Make sure you discuss any questions you have with your health care provider.   Document Released: 03/24/2009 Document Revised: 06/01/2013 Document Reviewed: 11/26/2012 Elsevier Interactive Patient Education [employer].

## 2015-09-27 NOTE — H&P (View-Only) (Signed)
Patient: Namir Allee Snead / Admit Date: 09/19/2015 / Date of Encounter: 09/21/2015, 8:44 AM   Subjective: No acute overnight events. Feeling well this morning. HGB 10-->10-->9.4 this morning. GI has seen patient and does not plan to perform any invasive studies at this time.   Review of Systems: Review of Systems  Constitutional: Positive for malaise/fatigue. Negative for fever, chills, weight loss and diaphoresis.  HENT: Negative for congestion.   Eyes: Negative for discharge and redness.  Respiratory: Positive for shortness of breath. Negative for cough, hemoptysis, sputum production and wheezing.   Cardiovascular: Positive for leg swelling. Negative for chest pain, palpitations, orthopnea, claudication and PND.  Gastrointestinal: Negative for nausea and vomiting.  Musculoskeletal: Negative for falls.  Skin: Negative for rash.  Neurological: Negative for dizziness, sensory change, speech change, focal weakness and weakness.  Endo/Heme/Allergies: Does not bruise/bleed easily.  Psychiatric/Behavioral: The patient is not nervous/anxious.   All other systems reviewed and are negative.   Objective: Telemetry: Afib, 80's, occasional PVCs Physical Exam: Blood pressure 151/70, pulse 84, temperature 98.8 F (37.1 C), temperature source Oral, resp. rate 20, height 5\' 10"  (1.778 m), weight 230 lb (104.327 kg), SpO2 96 %. Body mass index is 33 kg/(m^2). General: Well developed, well nourished, in no acute distress. Head: Normocephalic, atraumatic, sclera non-icteric, no xanthomas, nares are without discharge. Neck: Negative for carotid bruits. JVP not elevated. Lungs: Clear bilaterally to auscultation without wheezes, rales, or rhonchi. Breathing is unlabored. Heart: RRR S1 S2 without murmurs, rubs, or gallops.  Abdomen: Obese, soft, non-tender, non-distended with normoactive bowel sounds. No rebound/guarding. Extremities: No clubbing or cyanosis. Trace pre-tibial edema. Distal pedal pulses  are 2+ and equal bilaterally. Neuro: Alert and oriented X 3. Moves all extremities spontaneously. Psych:  Responds to questions appropriately with a normal affect.   Intake/Output Summary (Last 24 hours) at 09/21/15 0844 Last data filed at 09/21/15 0843  Gross per 24 hour  Intake      0 ml  Output   1150 ml  Net  -1150 ml    Inpatient Medications:  . amLODipine  5 mg Oral Daily  . apixaban  2.5 mg Oral BID  . calcitRIOL  0.25 mcg Oral Q M,W,F  . carvedilol  6.25 mg Oral BID  . cholecalciferol  1,000 Units Oral Daily  . escitalopram  10 mg Oral QHS  . ferrous sulfate  325 mg Oral Q breakfast  . furosemide  20 mg Intravenous Once  . [START ON 09/22/2015] furosemide  20 mg Oral Daily  . hydroxychloroquine  200 mg Oral Daily  . insulin aspart  0-5 Units Subcutaneous QHS  . insulin aspart  0-9 Units Subcutaneous TID WC  . insulin glargine  25 Units Subcutaneous QHS  . isosorbide mononitrate  30 mg Oral Daily  . multivitamin with minerals  1 tablet Oral Daily  . multivitamin-lutein  2 capsule Oral BID  . pantoprazole (PROTONIX) IV  40 mg Intravenous Q12H  . tamsulosin  0.4 mg Oral QPC supper  . vitamin C  500 mg Oral Daily   Infusions:    Labs:  Recent Labs  09/20/15 0103 09/21/15 0056  NA 134* 132*  K 4.1 4.1  CL 103 101  CO2 23 23  GLUCOSE 205* 314*  BUN 42* 35*  CREATININE 2.13* 1.85*  CALCIUM 8.6* 8.4*    Recent Labs  09/19/15 1802  AST 25  ALT 15*  ALKPHOS 108  BILITOT 0.5  PROT 6.7  ALBUMIN 3.6  Recent Labs  09/19/15 1430 09/19/15 1802  09/20/15 2004 09/21/15 0056  WBC 6.3 5.5  < > 7.0 7.4  NEUTROABS 4.6 3.9  --   --   --   HGB 7.7 Repeated and verified X2.* 7.5*  < > 10.0* 9.4*  HCT 23.5 Repeated and verified X2.* 22.8*  < > 29.8* 29.1*  MCV 84.8 84.9  < > 83.7 84.4  PLT 239.0 226  < > 227 208  < > = values in this interval not displayed.  Recent Labs  09/19/15 1802  TROPONINI <0.03   Invalid input(s): POCBNP  Recent Labs   09/20/15 0103  HGBA1C 6.8*     Weights: Filed Weights   09/19/15 1743  Weight: 230 lb (104.327 kg)     Radiology/Studies:  No results found.   Assessment and Plan   1. Chronic Afib s/p Watchman device: -Currently rate controlled with HR in the 80's bpm -Continue Coreg 6.25 mg bid -Eliquis has been held since his arrival to the ED in the evening on 4/11. Case with discussed by Dr. Rockey Situ, MD with Dr. Rayann Heman (pricipal investigator with Watchman program for [facility]) -AAfter case was discussed with Watchman team Eliquis was held on 4/12 to assess for stability in HGB. If found to be stable he could then resume Eliquis 2.5 mg on 4/13 -HGB trend since consult 10-->10-->9.4 drawn at 12:56 AM and resulted at 1:15 AM -Given this slight dip in HGB this morning we will draw stat CBC to assess for stability in HGB vs downward trend. If HGB is found to be stable he would be ok to resume Eliquis 2.5 mg bid today and likely be discharged; however if there is a continued downward trend into the mid 8 range he would likely need further inpatient evaluation/treatment -He will continue with planned TEE on 4/19 with Watchman investigating team  -Long term anticoagulation/antiplatelet therapy to be dictated by Watchman team -CHADS2VASc at least 8 (CHF, HTN, age x 2, DM, stroke x 2, vascular disease)  2. Recurrent GI bleed: -Most recent episode was isolated on 4/7 leading to a drop in HGB to 7.5, s/p 2 units of pRBC, HGB of 9.9 s/p 2 units of pRBC upon admission. HGB trend since has been 10-->10-->9.4 this morning as above -Check stat HGB as above -No further episodes of melena, has had BM since -GI without plans for invasive evaluation   3. CAD s/p CABG as above: -No symptoms of angina -No plans for ischemic evaluation at this time -On Eliquis in place on aspirin  -Coreg as above -Imdur 30 mg daily  4. Chronic diastolic CHF: -Trace pre-tibial edema -IV Lasix x 1 today -Coreg as  above  5. Acute on chronic microcytic anemia: -Iron deficiency in etiology from blood loss -Improved s/p 2 units of pRBC -Monitor for stability -On Ferous sulfate  -Per IM/GI  6. History of bioprosthetic valve: -Stable on most recent echo  7. History of stroke: -Occurred while off anticoagulation 2/2 GI bleed -Patient reports he would rather require transfusions than be at increased risk for repeat stroke -Residual left-handed weakness -On Eliquis as above  8. SOB: -Likely in the setting of #5 -Improved s/p transfusion x 2 of pRBC  Signed, Christell Faith, PA-C Pager: [phone] 09/21/2015, 8:44 AM    Attending Note Patient seen and examined, agree with detailed note above,  Patient presentation and plan discussed on rounds.   Patient denies any melena or abdominal discomfort Improvement in his shortness of breath,  denies leg edema Would like to go home if possible Hemoglobin last night 9.4, slight drop from 10 Labs from this morning is pending. Currently off his anticoagulation 36 hours  Clinical exam, lungs are relatively clear, heart rate irregular reasonable rate, abdomen soft, nontender, trace pitting edema to the low shins.  ----Recommend repeat blood count this morning, If stable would restart eliquis  2.5 mg twice a day and consider discharge  if there is a significant decline in blood count, would hold off on starting anticoagulation and may need to be monitored an additional day .  For his mild shortness of breath, leg edema consistent with acute on chronic diastolic CHF, will give IV Lasix 20 mg 1 this morning. Would recommended he continue Lasix 20 mg daily at home  Details discussed with him    Greater than 50% was spent in counseling and coordination of care with patient Total encounter time 25 minutes or more   Signed: Esmond Plants  M.D., Ph.D. [facility]

## 2015-09-27 NOTE — CV Procedure (Signed)
Post Watchman TEE See full note in [employer]ics During this procedure the patient is administered a total of Versed 3 mg and Fentanyl 50 mg to achieve and maintain moderate conscious sedation.  The patient's heart rate, blood pressure, and oxygen saturation are monitored continuously during the procedure. The period of conscious sedation is 45 minutes, of which I was present face-to-face 100% of this time.  Device positioned proximally with large shoulders No peri device leak on any view  Jenkins Rouge

## 2015-09-27 NOTE — Interval H&P Note (Signed)
History and Physical Interval Note:  09/27/2015 8:01 AM  [name]  has presented today for surgery, with the diagnosis of A FIB   The various methods of treatment have been discussed with the patient and family. After consideration of risks, benefits and other options for treatment, the patient has consented to  Procedure(s): TRANSESOPHAGEAL ECHOCARDIOGRAM (TEE) (N/A) as a surgical intervention .  The patient's history has been reviewed, patient examined, no change in status, stable for surgery.  I have reviewed the patient's chart and labs.  Questions were answered to the patient's satisfaction.     Jenkins Rouge

## 2015-09-28 ENCOUNTER — Telehealth: Payer: Self-pay | Admitting: *Deleted

## 2015-09-28 ENCOUNTER — Encounter (HOSPITAL_COMMUNITY): Payer: Self-pay | Admitting: Cardiovascular Disease

## 2015-09-28 DIAGNOSIS — D500 Iron deficiency anemia secondary to blood loss (chronic): Secondary | ICD-10-CM

## 2015-09-28 NOTE — Telephone Encounter (Signed)
Patient stated that he needed orders to have his hemoglobin checked. He stated that Dr. Lupita Dawn knows what he needs, he explained that he had a procedure with his heart on Tuesday.Please advise proper scheduled appt.

## 2015-09-28 NOTE — Telephone Encounter (Signed)
Spoke with the patient and Scheduled for tomorrow at 215, thanks

## 2015-09-28 NOTE — Telephone Encounter (Signed)
Please advise and order for Hgb if needed.  Patient received blood on the 14th.  Thanks

## 2015-09-28 NOTE — Telephone Encounter (Signed)
He needs a weekly cbc w diff.  ordered for tomorrow

## 2015-09-28 NOTE — ED Notes (Signed)
EKG: Interpreted by me, atrial fibrillation with a rate of 62 bpm, premature ventricular contraction, left ventricular hypertrophy, left anterior fascicular block  Earleen Newport, MD 09/28/15 1606

## 2015-09-29 ENCOUNTER — Other Ambulatory Visit (INDEPENDENT_AMBULATORY_CARE_PROVIDER_SITE_OTHER): Payer: Medicare Other

## 2015-09-29 DIAGNOSIS — I130 Hypertensive heart and chronic kidney disease with heart failure and stage 1 through stage 4 chronic kidney disease, or unspecified chronic kidney disease: Secondary | ICD-10-CM | POA: Diagnosis not present

## 2015-09-29 DIAGNOSIS — K922 Gastrointestinal hemorrhage, unspecified: Secondary | ICD-10-CM | POA: Diagnosis not present

## 2015-09-29 DIAGNOSIS — E1122 Type 2 diabetes mellitus with diabetic chronic kidney disease: Secondary | ICD-10-CM | POA: Diagnosis not present

## 2015-09-29 DIAGNOSIS — D500 Iron deficiency anemia secondary to blood loss (chronic): Secondary | ICD-10-CM | POA: Diagnosis not present

## 2015-09-29 DIAGNOSIS — E1142 Type 2 diabetes mellitus with diabetic polyneuropathy: Secondary | ICD-10-CM | POA: Diagnosis not present

## 2015-09-29 DIAGNOSIS — I5032 Chronic diastolic (congestive) heart failure: Secondary | ICD-10-CM | POA: Diagnosis not present

## 2015-09-29 DIAGNOSIS — N183 Chronic kidney disease, stage 3 (moderate): Secondary | ICD-10-CM | POA: Diagnosis not present

## 2015-09-29 LAB — CBC WITH DIFFERENTIAL/PLATELET
BASOS ABS: 0.1 10*3/uL (ref 0.0–0.1)
Basophils Relative: 1.1 % (ref 0.0–3.0)
EOS PCT: 4.4 % (ref 0.0–5.0)
Eosinophils Absolute: 0.3 10*3/uL (ref 0.0–0.7)
HEMATOCRIT: 26.8 % — AB (ref 39.0–52.0)
HEMOGLOBIN: 8.8 g/dL — AB (ref 13.0–17.0)
Lymphocytes Relative: 12 % (ref 12.0–46.0)
Lymphs Abs: 0.7 10*3/uL (ref 0.7–4.0)
MCHC: 32.7 g/dL (ref 30.0–36.0)
MCV: 85 fl (ref 78.0–100.0)
MONOS PCT: 11.5 % (ref 3.0–12.0)
Monocytes Absolute: 0.7 10*3/uL (ref 0.1–1.0)
NEUTROS PCT: 71 % (ref 43.0–77.0)
Neutro Abs: 4.1 10*3/uL (ref 1.4–7.7)
Platelets: 247 10*3/uL (ref 150.0–400.0)
RBC: 3.15 Mil/uL — AB (ref 4.22–5.81)
RDW: 15.1 % (ref 11.5–15.5)
WBC: 5.7 10*3/uL (ref 4.0–10.5)

## 2015-10-02 ENCOUNTER — Telehealth: Payer: Self-pay | Admitting: Nurse Practitioner

## 2015-10-02 MED ORDER — CLOPIDOGREL BISULFATE 75 MG PO TABS: 75.0000 mg | ORAL_TABLET | Freq: Every day | ORAL | Status: DC

## 2015-10-02 MED ORDER — ASPIRIN EC 325 MG PO TBEC: 325.0000 mg | DELAYED_RELEASE_TABLET | Freq: Every day | ORAL | Status: DC

## 2015-10-02 NOTE — Telephone Encounter (Signed)
Post Watchman TEE with good position and no leak. Will discontinue Eliquis and start Plavix 75mg  daily as well as ASA 325mg  daily. Will need to follow closely with weekly CBC's.  Message sent to Dr Derrel Nip to help manage.  If recurrent bleeding will need to alter anti-platelet therapy.  Dr Rayann Heman spoke with Dr Acie Fredrickson (pt's son) about med changes.  Rx sent to pharmacy.  Chanetta Marshall, NP 10/02/2015 6:52 PM

## 2015-10-03 ENCOUNTER — Other Ambulatory Visit (INDEPENDENT_AMBULATORY_CARE_PROVIDER_SITE_OTHER): Payer: Medicare Other

## 2015-10-03 DIAGNOSIS — D631 Anemia in chronic kidney disease: Secondary | ICD-10-CM | POA: Diagnosis not present

## 2015-10-03 DIAGNOSIS — N189 Chronic kidney disease, unspecified: Secondary | ICD-10-CM | POA: Diagnosis not present

## 2015-10-03 LAB — CBC WITH DIFFERENTIAL/PLATELET
Basophils Absolute: 0 10*3/uL (ref 0.0–0.1)
Basophils Relative: 0.3 % (ref 0.0–3.0)
EOS ABS: 0.3 10*3/uL (ref 0.0–0.7)
Eosinophils Relative: 4.9 % (ref 0.0–5.0)
LYMPHS PCT: 11.2 % — AB (ref 12.0–46.0)
Lymphs Abs: 0.7 10*3/uL (ref 0.7–4.0)
MCHC: 32.6 g/dL (ref 30.0–36.0)
MCV: 84.2 fl (ref 78.0–100.0)
MONO ABS: 0.7 10*3/uL (ref 0.1–1.0)
Monocytes Relative: 10.9 % (ref 3.0–12.0)
Neutro Abs: 4.5 10*3/uL (ref 1.4–7.7)
Neutrophils Relative %: 72.7 % (ref 43.0–77.0)
Platelets: 274 10*3/uL (ref 150.0–400.0)
RBC: 3.23 Mil/uL — AB (ref 4.22–5.81)
RDW: 14.8 % (ref 11.5–15.5)
WBC: 6.3 10*3/uL (ref 4.0–10.5)

## 2015-10-04 ENCOUNTER — Telehealth: Payer: Self-pay | Admitting: Internal Medicine

## 2015-10-04 NOTE — Telephone Encounter (Signed)
Pt called for his lab results that were done yesterday. Please call pt at home.

## 2015-10-04 NOTE — Telephone Encounter (Signed)
Patient is not SOB today is feeling better, Patient wanted to notify you that took him off Eliquis added Plavix at cardiology.  Patient spoke clearly on phone.

## 2015-10-06 ENCOUNTER — Other Ambulatory Visit: Payer: Medicare Other

## 2015-10-06 ENCOUNTER — Inpatient Hospital Stay: Payer: Medicare Other

## 2015-10-06 NOTE — Telephone Encounter (Signed)
Dr Rayann Heman and Dr Burt Knack discussed Plan now to take only Plavix daily and no ASA Spoke with patient who is aware to discontinue Eliquis and start Plavix 75mg  daily  Chanetta Marshall, NP 10/06/2015 9:35 AM

## 2015-10-09 ENCOUNTER — Other Ambulatory Visit (INDEPENDENT_AMBULATORY_CARE_PROVIDER_SITE_OTHER): Payer: Medicare Other

## 2015-10-09 DIAGNOSIS — N189 Chronic kidney disease, unspecified: Secondary | ICD-10-CM | POA: Diagnosis not present

## 2015-10-09 DIAGNOSIS — D631 Anemia in chronic kidney disease: Secondary | ICD-10-CM | POA: Diagnosis not present

## 2015-10-09 LAB — CBC WITH DIFFERENTIAL/PLATELET
Basophils Absolute: 0 10*3/uL (ref 0.0–0.1)
Basophils Relative: 0.6 % (ref 0.0–3.0)
Eosinophils Absolute: 0.3 10*3/uL (ref 0.0–0.7)
Eosinophils Relative: 4.3 % (ref 0.0–5.0)
HCT: 25.4 % — ABNORMAL LOW (ref 39.0–52.0)
LYMPHS ABS: 0.6 10*3/uL — AB (ref 0.7–4.0)
Lymphocytes Relative: 10.8 % — ABNORMAL LOW (ref 12.0–46.0)
MCHC: 32.8 g/dL (ref 30.0–36.0)
MCV: 84.2 fl (ref 78.0–100.0)
Monocytes Absolute: 0.7 10*3/uL (ref 0.1–1.0)
Monocytes Relative: 11.4 % (ref 3.0–12.0)
NEUTROS PCT: 72.9 % (ref 43.0–77.0)
Neutro Abs: 4.3 10*3/uL (ref 1.4–7.7)
Platelets: 253 10*3/uL (ref 150.0–400.0)
RBC: 3.02 Mil/uL — AB (ref 4.22–5.81)
RDW: 15.2 % (ref 11.5–15.5)
WBC: 5.9 10*3/uL (ref 4.0–10.5)

## 2015-10-10 ENCOUNTER — Telehealth: Payer: Self-pay | Admitting: *Deleted

## 2015-10-10 NOTE — Telephone Encounter (Signed)
[name] spoke with the patient. thanks 

## 2015-10-10 NOTE — Telephone Encounter (Signed)
Patient request lab results from 10/09/15.

## 2015-10-16 ENCOUNTER — Telehealth: Payer: Self-pay | Admitting: *Deleted

## 2015-10-16 ENCOUNTER — Other Ambulatory Visit (INDEPENDENT_AMBULATORY_CARE_PROVIDER_SITE_OTHER): Payer: Medicare Other

## 2015-10-16 DIAGNOSIS — N189 Chronic kidney disease, unspecified: Secondary | ICD-10-CM

## 2015-10-16 DIAGNOSIS — D631 Anemia in chronic kidney disease: Secondary | ICD-10-CM

## 2015-10-16 LAB — CBC WITH DIFFERENTIAL/PLATELET
BASOS ABS: 0 10*3/uL (ref 0.0–0.1)
Basophils Relative: 0.5 % (ref 0.0–3.0)
EOS ABS: 0.3 10*3/uL (ref 0.0–0.7)
Eosinophils Relative: 4.7 % (ref 0.0–5.0)
LYMPHS PCT: 12.9 % (ref 12.0–46.0)
Lymphs Abs: 0.8 10*3/uL (ref 0.7–4.0)
MCHC: 32.8 g/dL (ref 30.0–36.0)
MCV: 84.5 fl (ref 78.0–100.0)
MONO ABS: 0.7 10*3/uL (ref 0.1–1.0)
Monocytes Relative: 11.7 % (ref 3.0–12.0)
NEUTROS ABS: 4.2 10*3/uL (ref 1.4–7.7)
Neutrophils Relative %: 70.2 % (ref 43.0–77.0)
PLATELETS: 211 10*3/uL (ref 150.0–400.0)
RBC: 2.86 Mil/uL — ABNORMAL LOW (ref 4.22–5.81)
RDW: 16 % — AB (ref 11.5–15.5)
WBC: 6 10*3/uL (ref 4.0–10.5)

## 2015-10-16 NOTE — Telephone Encounter (Signed)
Labs and dx?  

## 2015-10-17 ENCOUNTER — Encounter: Payer: Self-pay | Admitting: Internal Medicine

## 2015-10-17 ENCOUNTER — Other Ambulatory Visit: Payer: Self-pay | Admitting: Family Medicine

## 2015-10-17 ENCOUNTER — Ambulatory Visit (INDEPENDENT_AMBULATORY_CARE_PROVIDER_SITE_OTHER): Payer: Medicare Other | Admitting: Internal Medicine

## 2015-10-17 VITALS — BP 140/58 | HR 57 | Temp 98.2°F | Resp 14 | Ht 70.0 in | Wt 235.8 lb

## 2015-10-17 DIAGNOSIS — E1121 Type 2 diabetes mellitus with diabetic nephropathy: Secondary | ICD-10-CM

## 2015-10-17 DIAGNOSIS — D649 Anemia, unspecified: Secondary | ICD-10-CM | POA: Diagnosis not present

## 2015-10-17 DIAGNOSIS — I2510 Atherosclerotic heart disease of native coronary artery without angina pectoris: Secondary | ICD-10-CM | POA: Diagnosis not present

## 2015-10-17 DIAGNOSIS — D6489 Other specified anemias: Secondary | ICD-10-CM

## 2015-10-17 NOTE — Patient Instructions (Addendum)
Your blood sugar was 231, so you should resume your usual sliding scale before lunch   Your breakfast should be more protein based,  Less carbohdrate based (not cereal every day) [employer] now makes a frozen breakfast frittata that can be microwaved in 2 minutes and is very low carb. Frittatas are similar to quiches without the crust. They are low in carbohydrates and high in protein     We are getting you an appointment at the cancer center to get a blood transfusion   [employer] pasta is lower carb and better tasting   use the low glycemic index KIND Bars as a snack instead of candy bars.    The Cancer center is going to give you a transfusion tomorrow.

## 2015-10-17 NOTE — Progress Notes (Signed)
of Novol  Subjective:  Patient ID: [name], male    DOB: [birth year]  Age: 82 y.o. 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 A diagnosis of Symptomatic anemia was also pertinent to this visit.  HPI [name] presents for follow up on type 2 DM, ischemic cardiomyopathy and atrial fib with prior CVA,  anemia of chronic GI bleed    Since his  last visit he has had a cardiac procedure with left atrial appendage occlusion (Watchman procedure) on March 2,   And has had several transfusions due to recurrent symptomatic drops in hg to 8 and below.   Has been walking daily until yesterday when he became too tired to walk.   He is short of breath with minimal exertion ,  And having trouble completing his ADLs.  Just getting up to use the bathroom makes him very short of breath .  Typically this dyspnea resolves with one unit of  PRBCs,  But when he receives 2 units he is able to perform all of his ADLS without dyspnea.   This morning his stool became soft and he has had 3 today the last 2 were very soft. They were not black today,  But his stomach feels upset today not nauseated ,  Feeling dizzy.     DM Type  2:  BS was 500 this morning .  He thinks he may have missed a dose of insulin yesterday.  This morning took 20 units of Novolog (based on sliding scale) this morning and his usual dose of 50 units of Lantus   Lab Results  Component Value Date   HGBA1C 6.8* 09/20/2015         Outpatient Prescriptions Prior to Visit  Medication Sig Dispense Refill  . acetaminophen (TYLENOL) 325 MG tablet Take 2 tablets (650 mg total) by mouth every 6 (six) hours as needed for mild pain (or Fever >/= 101).    [name] amLODipine (NORVASC) 5 MG tablet Take 5 mg by mouth daily.    [name] aspirin EC 325 MG tablet Take 1 tablet (325 mg total) by mouth daily. 30 tablet 0  . calcitRIOL (ROCALTROL) 0.25 MCG capsule Take 0.25 mcg by mouth  every Monday, Wednesday, and Friday.    . carvedilol (COREG) 6.25 MG tablet Take 6.25 mg by mouth 2 (two) times daily.    . cholecalciferol (VITAMIN D) 1000 units tablet Take 1,000 Units by mouth daily.    . clopidogrel (PLAVIX) 75 MG tablet Take 1 tablet (75 mg total) by mouth daily. 90 tablet 3  . docusate sodium (COLACE) 100 MG capsule Take 100 mg by mouth daily.    [name] escitalopram (LEXAPRO) 10 MG tablet Take 10 mg by mouth at bedtime.    . ferrous sulfate 325 (65 FE) MG tablet Take 325 mg by mouth daily with breakfast.    . furosemide (LASIX) 20 MG tablet Take 20 mg by mouth daily.    . hydroxychloroquine (PLAQUENIL) 200 MG tablet Take 200 mg by mouth daily.    . insulin aspart (NOVOLOG) 100 UNIT/ML injection Inject 3-7 Units into the skin 3 (three) times daily with meals as needed for high blood sugar. Pt uses as needed per sliding scale:    100-150:  3 units  151-200:  4 units  201-250:  5 units  Greater than 250:  7 units    . insulin glargine (LANTUS) 100 UNIT/ML injection Inject  50 Units into the skin daily.    . isosorbide mononitrate (IMDUR) 30 MG 24 hr tablet Take 1 tablet (30 mg total) by mouth daily. 30 tablet 5  . Multiple Vitamin (MULTIVITAMIN WITH MINERALS) TABS tablet Take 1 tablet by mouth daily.    . Multiple Vitamins-Minerals (PRESERVISION AREDS 2) CAPS Take 1 capsule by mouth 2 (two) times daily.    . pantoprazole (PROTONIX) 40 MG tablet Take 40 mg by mouth daily.    . tamsulosin (FLOMAX) 0.4 MG CAPS capsule Take 0.4 mg by mouth daily after supper.    . vitamin C (ASCORBIC ACID) 500 MG tablet Take 500 mg by mouth daily.    [name] oxyCODONE (OXY IR/ROXICODONE) 5 MG immediate release tablet Take 1 tablet (5 mg total) by mouth every 4 (four) hours as needed for moderate pain. (Patient not taking: Reported on 10/17/2015) 30 tablet 0   No facility-administered medications prior to visit.    Review of Systems;  Patient denies headache, fevers, malaise, unintentional weight loss,  skin rash, eye pain, sinus congestion and sinus pain, sore throat, dysphagia,  hemoptysis , cough, dyspnea, wheezing, chest pain, palpitations, orthopnea, edema, abdominal pain, nausea, melena, diarrhea, constipation, flank pain, dysuria, hematuria, urinary  Frequency, nocturia, numbness, tingling, seizures,  Focal weakness, Loss of consciousness,  Tremor, insomnia, depression, anxiety, and suicidal ideation.      Objective:  BP 140/58 mmHg  Pulse 57  Temp(Src) 98.2 F (36.8 C) (Oral)  Resp 14  Ht 5\' 10"  (1.778 m)  Wt 235 lb 12 oz (106.935 kg)  BMI 33.83 kg/m2  SpO2 98%  BP Readings from Last 3 Encounters:  10/17/15 140/58  09/27/15 143/50  09/21/15 126/49    Wt Readings from Last 3 Encounters:  10/17/15 235 lb 12 oz (106.935 kg)  09/19/15 230 lb (104.327 kg)  09/13/15 239 lb 6.4 oz (108.591 kg)    General appearance: alert, cooperative and appears stated age Ears: normal TM's and external ear canals both ears Throat: lips, mucosa, and tongue normal; teeth and gums normal Neck: no adenopathy, no carotid bruit, supple, symmetrical, trachea midline and thyroid not enlarged, symmetric, no tenderness/mass/nodules Back: symmetric, no curvature. ROM normal. No CVA tenderness. Lungs: clear to auscultation bilaterally Heart: regular rate and rhythm, S1, S2 normal, no murmur, click, rub or gallop Abdomen: soft, non-tender; bowel sounds normal; no masses,  no organomegaly Pulses: 2+ and symmetric Skin: Skin color, texture, turgor normal. No rashes or lesions Lymph nodes: Cervical, supraclavicular, and axillary nodes normal.  Lab Results  Component Value Date   HGBA1C 6.8* 09/20/2015   HGBA1C 6.9* 06/22/2015   HGBA1C 7.1* 03/06/2015    Lab Results  Component Value Date   CREATININE 1.85* 09/21/2015   CREATININE 2.13* 09/20/2015   CREATININE 2.07* 09/19/2015    Lab Results  Component Value Date   WBC 6.0 10/16/2015   HGB 8.1 Repeated and verified X2.* 10/16/2015   HCT  24.1 L* 10/16/2015   PLT 211.0 10/16/2015   GLUCOSE 314* 09/21/2015   CHOL 129 01/18/2015   TRIG 58.0 01/18/2015   HDL 45.00 01/18/2015   LDLDIRECT 56.0 06/22/2015   LDLCALC 72 01/18/2015   ALT 15* 09/19/2015   AST 25 09/19/2015   NA 132* 09/21/2015   K 4.1 09/21/2015   CL 101 09/21/2015   CREATININE 1.85* 09/21/2015   BUN 35* 09/21/2015   CO2 23 09/21/2015   TSH 3.53 09/07/2014   PSA 0.48 09/07/2014   INR 1.18 09/19/2015   HGBA1C 6.8* 09/20/2015  MICROALBUR 70.5* 06/21/2015    No results found.  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 Advised to resume his sliding scale starting with lunch.    Lab Results  Component Value Date   HGBA1C 6.8* 09/20/2015   Lab Results  Component Value Date   MICROALBUR 70.5* 06/21/2015         Symptomatic anemia    He has an appointment at the [facility] on Wednesday for a transfusion        A total of 25 minutes of face to face time was spent with patient more than half of which was spent in counselling about the above mentioned conditions  and coordination of care   I am having Mr. Suppes maintain his ferrous sulfate, insulin aspart, carvedilol, docusate sodium, hydroxychloroquine, vitamin C, multivitamin with minerals, calcitRIOL, isosorbide mononitrate, amLODipine, escitalopram, furosemide, insulin glargine, tamsulosin, pantoprazole, PRESERVISION AREDS 2, cholecalciferol, acetaminophen, aspirin EC, and clopidogrel.  No orders of the defined types were placed in this encounter.    There are no discontinued medications.  Follow-up: Return in about 1 month (around 11/17/2015), or July , for follow up diabetes.   [name], MD

## 2015-10-17 NOTE — Progress Notes (Signed)
Pre-visit discussion using our clinic review tool. No additional management support is needed unless otherwise documented below in the visit note.  

## 2015-10-18 ENCOUNTER — Telehealth: Payer: Self-pay | Admitting: *Deleted

## 2015-10-18 MED ORDER — HYDROCODONE-ACETAMINOPHEN 5-325 MG PO TABS: 1.0000 | ORAL_TABLET | Freq: Every day | ORAL | Status: DC | PRN

## 2015-10-18 NOTE — Telephone Encounter (Signed)
Yes it was missed yesterday.  i have printed the rx

## 2015-10-18 NOTE — Telephone Encounter (Signed)
Patient has requested a Rx for hydrocodone, he stated that the oxycodone is to strong.  Please advise

## 2015-10-18 NOTE — Assessment & Plan Note (Signed)
He has an appointment at the [facility] on Wednesday for a transfusion

## 2015-10-18 NOTE — Assessment & Plan Note (Signed)
His A1c is at goal,  But his sugars are labile due to forgetting to take his insulin dose last night. Advised to resume his sliding scale starting with lunch.    Lab Results  Component Value Date   HGBA1C 6.8* 09/20/2015   Lab Results  Component Value Date   MICROALBUR 70.5* 06/21/2015

## 2015-10-18 NOTE — Telephone Encounter (Signed)
At his appt yesterday he reported not taking this.  Please advise?

## 2015-10-19 ENCOUNTER — Inpatient Hospital Stay: Payer: Medicare Other | Attending: Hematology and Oncology

## 2015-10-19 ENCOUNTER — Inpatient Hospital Stay: Payer: Medicare Other

## 2015-10-19 VITALS — BP 165/71 | HR 68 | Temp 98.2°F

## 2015-10-19 DIAGNOSIS — D649 Anemia, unspecified: Secondary | ICD-10-CM | POA: Insufficient documentation

## 2015-10-19 DIAGNOSIS — Z79899 Other long term (current) drug therapy: Secondary | ICD-10-CM | POA: Insufficient documentation

## 2015-10-19 DIAGNOSIS — D6489 Other specified anemias: Secondary | ICD-10-CM

## 2015-10-19 DIAGNOSIS — D508 Other iron deficiency anemias: Secondary | ICD-10-CM

## 2015-10-19 LAB — CBC WITH DIFFERENTIAL/PLATELET
BASOS PCT: 1 %
Basophils Absolute: 0.1 10*3/uL (ref 0–0.1)
EOS ABS: 0.3 10*3/uL (ref 0–0.7)
Eosinophils Relative: 7 %
HCT: 23.5 % — ABNORMAL LOW (ref 40.0–52.0)
HEMOGLOBIN: 8 g/dL — AB (ref 13.0–18.0)
Lymphocytes Relative: 14 %
Lymphs Abs: 0.7 10*3/uL — ABNORMAL LOW (ref 1.0–3.6)
MCH: 28.5 pg (ref 26.0–34.0)
MCHC: 33.9 g/dL (ref 32.0–36.0)
MCV: 84.1 fL (ref 80.0–100.0)
MONOS PCT: 13 %
Monocytes Absolute: 0.7 10*3/uL (ref 0.2–1.0)
NEUTROS PCT: 65 %
Neutro Abs: 3.4 10*3/uL (ref 1.4–6.5)
Platelets: 173 10*3/uL (ref 150–440)
RBC: 2.8 MIL/uL — ABNORMAL LOW (ref 4.40–5.90)
RDW: 15.7 % — ABNORMAL HIGH (ref 11.5–14.5)
WBC: 5.2 10*3/uL (ref 3.8–10.6)

## 2015-10-19 LAB — PREPARE RBC (CROSSMATCH)

## 2015-10-19 LAB — SAMPLE TO BLOOD BANK

## 2015-10-19 MED ORDER — DIPHENHYDRAMINE HCL 25 MG PO CAPS
25.0000 mg | ORAL_CAPSULE | Freq: Once | ORAL | Status: AC
  Administered 2015-10-19: 25 mg via ORAL
  Filled 2015-10-19: qty 1

## 2015-10-19 MED ORDER — ACETAMINOPHEN 325 MG PO TABS
650.0000 mg | ORAL_TABLET | Freq: Once | ORAL | Status: AC
  Administered 2015-10-19: 650 mg via ORAL
  Filled 2015-10-19: qty 2

## 2015-10-19 MED ORDER — SODIUM CHLORIDE 0.9 % IV SOLN
250.0000 mL | Freq: Once | INTRAVENOUS | Status: AC
  Administered 2015-10-19: 250 mL via INTRAVENOUS
  Filled 2015-10-19: qty 250

## 2015-10-19 NOTE — Telephone Encounter (Signed)
Spoke with the patient, he will pick it up tomorrow morning. thanks

## 2015-10-19 NOTE — Telephone Encounter (Signed)
Cannot reach patient at transfusion have placed at front Desk. Notified TEAM LEAD.

## 2015-10-20 LAB — TYPE AND SCREEN
ABO/RH(D): O POS
Antibody Screen: NEGATIVE
Unit division: 0

## 2015-10-23 ENCOUNTER — Ambulatory Visit (INDEPENDENT_AMBULATORY_CARE_PROVIDER_SITE_OTHER): Payer: Medicare Other | Admitting: Internal Medicine

## 2015-10-23 ENCOUNTER — Encounter: Payer: Self-pay | Admitting: Internal Medicine

## 2015-10-23 VITALS — BP 152/60 | HR 66 | Ht 70.0 in | Wt 239.0 lb

## 2015-10-23 DIAGNOSIS — I2510 Atherosclerotic heart disease of native coronary artery without angina pectoris: Secondary | ICD-10-CM

## 2015-10-23 DIAGNOSIS — K921 Melena: Secondary | ICD-10-CM

## 2015-10-23 DIAGNOSIS — I481 Persistent atrial fibrillation: Secondary | ICD-10-CM | POA: Diagnosis not present

## 2015-10-23 DIAGNOSIS — I4819 Other persistent atrial fibrillation: Secondary | ICD-10-CM

## 2015-10-23 NOTE — Patient Instructions (Signed)
Medication Instructions:  Your physician recommends that you continue on your current medications as directed. Please refer to the Current Medication list given to you today.   Labwork: None ordered   Testing/Procedures: None ordered   Follow-Up: Your physician wants you to follow-up in: September with Chanetta Marshall, NP You will receive a reminder letter in the mail two months in advance. If you don't receive a letter, please call our office to schedule the follow-up appointment.   Any Other Special Instructions Will Be Listed Below (If Applicable).     If you need a refill on your cardiac medications before your next appointment, please call your pharmacy.
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
## 2015-10-27 ENCOUNTER — Inpatient Hospital Stay: Payer: Medicare Other

## 2015-10-27 ENCOUNTER — Telehealth: Payer: Self-pay | Admitting: *Deleted

## 2015-10-27 DIAGNOSIS — D508 Other iron deficiency anemias: Secondary | ICD-10-CM

## 2015-10-27 DIAGNOSIS — D649 Anemia, unspecified: Secondary | ICD-10-CM | POA: Diagnosis not present

## 2015-10-27 DIAGNOSIS — Z79899 Other long term (current) drug therapy: Secondary | ICD-10-CM | POA: Diagnosis not present

## 2015-10-27 LAB — CBC WITH DIFFERENTIAL/PLATELET
Basophils Absolute: 0.1 10*3/uL (ref 0–0.1)
Basophils Relative: 1 %
EOS PCT: 4 %
Eosinophils Absolute: 0.2 10*3/uL (ref 0–0.7)
HEMATOCRIT: 25.6 % — AB (ref 40.0–52.0)
Hemoglobin: 8.5 g/dL — ABNORMAL LOW (ref 13.0–18.0)
LYMPHS ABS: 0.6 10*3/uL — AB (ref 1.0–3.6)
LYMPHS PCT: 10 %
MCH: 28.3 pg (ref 26.0–34.0)
MCHC: 33.4 g/dL (ref 32.0–36.0)
MCV: 84.7 fL (ref 80.0–100.0)
MONO ABS: 0.7 10*3/uL (ref 0.2–1.0)
MONOS PCT: 12 %
NEUTROS ABS: 4 10*3/uL (ref 1.4–6.5)
Neutrophils Relative %: 73 %
PLATELETS: 202 10*3/uL (ref 150–440)
RBC: 3.02 MIL/uL — ABNORMAL LOW (ref 4.40–5.90)
RDW: 16 % — AB (ref 11.5–14.5)
WBC: 5.5 10*3/uL (ref 3.8–10.6)

## 2015-10-27 NOTE — Telephone Encounter (Signed)
Please advise lab results thanks,

## 2015-10-27 NOTE — Telephone Encounter (Signed)
Patient requested his hemoglobin results from today,he had this drawn at the cancer center

## 2015-10-27 NOTE — Telephone Encounter (Signed)
[name],  Your father's hgb  Today was  8.5.  Up from 8.0 several days ago.

## 2015-10-30 ENCOUNTER — Other Ambulatory Visit: Payer: Self-pay | Admitting: *Deleted

## 2015-11-13 ENCOUNTER — Other Ambulatory Visit: Payer: Self-pay | Admitting: Internal Medicine

## 2015-11-20 ENCOUNTER — Other Ambulatory Visit: Payer: Self-pay

## 2015-11-20 DIAGNOSIS — D631 Anemia in chronic kidney disease: Secondary | ICD-10-CM

## 2015-11-20 DIAGNOSIS — N189 Chronic kidney disease, unspecified: Principal | ICD-10-CM

## 2015-11-21 ENCOUNTER — Telehealth: Payer: Self-pay | Admitting: Internal Medicine

## 2015-11-21 ENCOUNTER — Other Ambulatory Visit: Payer: Self-pay | Admitting: *Deleted

## 2015-11-21 ENCOUNTER — Inpatient Hospital Stay: Payer: Medicare Other | Attending: Hematology and Oncology

## 2015-11-21 ENCOUNTER — Other Ambulatory Visit: Payer: Medicare Other

## 2015-11-21 ENCOUNTER — Ambulatory Visit: Payer: Medicare Other | Admitting: Hematology and Oncology

## 2015-11-21 ENCOUNTER — Other Ambulatory Visit: Payer: Self-pay | Admitting: Hematology and Oncology

## 2015-11-21 ENCOUNTER — Inpatient Hospital Stay: Payer: Medicare Other

## 2015-11-21 ENCOUNTER — Inpatient Hospital Stay: Payer: Medicare Other | Admitting: Hematology and Oncology

## 2015-11-21 DIAGNOSIS — R0602 Shortness of breath: Secondary | ICD-10-CM | POA: Insufficient documentation

## 2015-11-21 DIAGNOSIS — E119 Type 2 diabetes mellitus without complications: Secondary | ICD-10-CM | POA: Diagnosis not present

## 2015-11-21 DIAGNOSIS — Z8673 Personal history of transient ischemic attack (TIA), and cerebral infarction without residual deficits: Secondary | ICD-10-CM | POA: Insufficient documentation

## 2015-11-21 DIAGNOSIS — I2510 Atherosclerotic heart disease of native coronary artery without angina pectoris: Secondary | ICD-10-CM | POA: Insufficient documentation

## 2015-11-21 DIAGNOSIS — D500 Iron deficiency anemia secondary to blood loss (chronic): Secondary | ICD-10-CM | POA: Insufficient documentation

## 2015-11-21 DIAGNOSIS — I441 Atrioventricular block, second degree: Secondary | ICD-10-CM | POA: Insufficient documentation

## 2015-11-21 DIAGNOSIS — Z79899 Other long term (current) drug therapy: Secondary | ICD-10-CM | POA: Diagnosis not present

## 2015-11-21 DIAGNOSIS — Z794 Long term (current) use of insulin: Secondary | ICD-10-CM | POA: Insufficient documentation

## 2015-11-21 DIAGNOSIS — I482 Chronic atrial fibrillation: Secondary | ICD-10-CM | POA: Diagnosis not present

## 2015-11-21 DIAGNOSIS — I5032 Chronic diastolic (congestive) heart failure: Secondary | ICD-10-CM | POA: Diagnosis not present

## 2015-11-21 DIAGNOSIS — D631 Anemia in chronic kidney disease: Secondary | ICD-10-CM | POA: Diagnosis not present

## 2015-11-21 DIAGNOSIS — D649 Anemia, unspecified: Secondary | ICD-10-CM

## 2015-11-21 DIAGNOSIS — I129 Hypertensive chronic kidney disease with stage 1 through stage 4 chronic kidney disease, or unspecified chronic kidney disease: Secondary | ICD-10-CM | POA: Insufficient documentation

## 2015-11-21 DIAGNOSIS — Z87891 Personal history of nicotine dependence: Secondary | ICD-10-CM | POA: Diagnosis not present

## 2015-11-21 DIAGNOSIS — N189 Chronic kidney disease, unspecified: Secondary | ICD-10-CM | POA: Diagnosis not present

## 2015-11-21 DIAGNOSIS — E785 Hyperlipidemia, unspecified: Secondary | ICD-10-CM | POA: Insufficient documentation

## 2015-11-21 DIAGNOSIS — Z8551 Personal history of malignant neoplasm of bladder: Secondary | ICD-10-CM | POA: Diagnosis not present

## 2015-11-21 DIAGNOSIS — M1990 Unspecified osteoarthritis, unspecified site: Secondary | ICD-10-CM | POA: Diagnosis not present

## 2015-11-21 LAB — RETICULOCYTES
RBC.: 2.88 MIL/uL — ABNORMAL LOW (ref 4.40–5.90)
Retic Count, Absolute: 34.6 10*3/uL (ref 19.0–183.0)
Retic Ct Pct: 1.2 % (ref 0.4–3.1)

## 2015-11-21 LAB — TSH: TSH: 4.231 u[IU]/mL (ref 0.350–4.500)

## 2015-11-21 LAB — CBC WITH DIFFERENTIAL/PLATELET
Basophils Absolute: 0.1 10*3/uL (ref 0–0.1)
Basophils Relative: 1 %
Eosinophils Absolute: 0.4 10*3/uL (ref 0–0.7)
Eosinophils Relative: 5 %
HCT: 24.4 % — ABNORMAL LOW (ref 40.0–52.0)
Hemoglobin: 8 g/dL — ABNORMAL LOW (ref 13.0–18.0)
Lymphocytes Relative: 9 %
Lymphs Abs: 0.6 10*3/uL — ABNORMAL LOW (ref 1.0–3.6)
MCH: 27.9 pg (ref 26.0–34.0)
MCHC: 32.9 g/dL (ref 32.0–36.0)
MCV: 84.7 fL (ref 80.0–100.0)
Monocytes Absolute: 0.8 10*3/uL (ref 0.2–1.0)
Monocytes Relative: 12 %
Neutro Abs: 5.1 10*3/uL (ref 1.4–6.5)
Neutrophils Relative %: 73 %
Platelets: 200 10*3/uL (ref 150–440)
RBC: 2.88 MIL/uL — ABNORMAL LOW (ref 4.40–5.90)
RDW: 16 % — ABNORMAL HIGH (ref 11.5–14.5)
WBC: 7 10*3/uL (ref 3.8–10.6)

## 2015-11-21 LAB — IRON AND TIBC
Iron: 31 ug/dL — ABNORMAL LOW (ref 45–182)
Saturation Ratios: 10 % — ABNORMAL LOW (ref 17.9–39.5)
TIBC: 316 ug/dL (ref 250–450)
UIBC: 285 ug/dL

## 2015-11-21 LAB — SAMPLE TO BLOOD BANK

## 2015-11-21 LAB — SEDIMENTATION RATE: Sed Rate: 37 mm/hr — ABNORMAL HIGH (ref 0–20)

## 2015-11-21 LAB — FOLATE: Folate: 78 ng/mL (ref >5.9)

## 2015-11-21 LAB — FERRITIN: Ferritin: 42 ng/mL (ref 24–336)

## 2015-11-21 NOTE — Telephone Encounter (Signed)
Please advise. Thanks.  

## 2015-11-21 NOTE — Telephone Encounter (Signed)
Patient stated he is not SOB but will return cal if he becomes short of breath.

## 2015-11-21 NOTE — Telephone Encounter (Signed)
Pt called to give Hemoglobin it was done this morning at [facility] level is 8.0. Pt wants to know if Dr Derrel Nip wants him to get a blood transfusion?   Call pt @ [phone]. Thank you!

## 2015-11-21 NOTE — Telephone Encounter (Signed)
Only if he is more short of breath than usual.

## 2015-11-22 ENCOUNTER — Telehealth: Payer: Self-pay | Admitting: *Deleted

## 2015-11-22 ENCOUNTER — Other Ambulatory Visit: Payer: Self-pay | Admitting: Hematology and Oncology

## 2015-11-22 DIAGNOSIS — N189 Chronic kidney disease, unspecified: Principal | ICD-10-CM

## 2015-11-22 DIAGNOSIS — D631 Anemia in chronic kidney disease: Secondary | ICD-10-CM

## 2015-11-22 LAB — PROTEIN ELECTROPHORESIS, SERUM
A/G Ratio: 1.2 (ref 0.7–1.7)
Albumin ELP: 3.7 g/dL (ref 2.9–4.4)
Alpha-1-Globulin: 0.3 g/dL (ref 0.0–0.4)
Alpha-2-Globulin: 0.8 g/dL (ref 0.4–1.0)
Beta Globulin: 1.1 g/dL (ref 0.7–1.3)
Gamma Globulin: 1 g/dL (ref 0.4–1.8)
Globulin, Total: 3.1 g/dL (ref 2.2–3.9)
Total Protein ELP: 6.8 g/dL (ref 6.0–8.5)

## 2015-11-22 LAB — KAPPA/LAMBDA LIGHT CHAINS
Kappa free light chain: 102.8 mg/L — ABNORMAL HIGH (ref 3.3–19.4)
Kappa, lambda light chain ratio: 2.04 — ABNORMAL HIGH (ref 0.26–1.65)
Lambda free light chains: 50.5 mg/L — ABNORMAL HIGH (ref 5.7–26.3)

## 2015-11-22 LAB — PREPARE RBC (CROSSMATCH)

## 2015-11-22 NOTE — Telephone Encounter (Signed)
Called pt yest. To give results and pt already got them and would like a unit of blood. I told him that I would ask md in am and call him and I did and she is ok with 1 unit due to his inc. Fatigue.  It has been arranged for 6/15 to be at cancer center 9:45 to see md since he  Missed his md visit and then 10 am for 1 unit of blood.  Pt agreeable to plan, orders entered.

## 2015-11-23 ENCOUNTER — Inpatient Hospital Stay (HOSPITAL_BASED_OUTPATIENT_CLINIC_OR_DEPARTMENT_OTHER): Payer: Medicare Other | Admitting: Hematology and Oncology

## 2015-11-23 ENCOUNTER — Inpatient Hospital Stay: Payer: Medicare Other

## 2015-11-23 VITALS — BP 182/75 | HR 66 | Temp 96.8°F | Resp 20

## 2015-11-23 VITALS — BP 150/72 | HR 71 | Temp 97.6°F | Resp 20 | Ht 70.0 in | Wt 242.1 lb

## 2015-11-23 DIAGNOSIS — I2510 Atherosclerotic heart disease of native coronary artery without angina pectoris: Secondary | ICD-10-CM

## 2015-11-23 DIAGNOSIS — Z8551 Personal history of malignant neoplasm of bladder: Secondary | ICD-10-CM

## 2015-11-23 DIAGNOSIS — M1990 Unspecified osteoarthritis, unspecified site: Secondary | ICD-10-CM

## 2015-11-23 DIAGNOSIS — Z79899 Other long term (current) drug therapy: Secondary | ICD-10-CM | POA: Diagnosis not present

## 2015-11-23 DIAGNOSIS — D649 Anemia, unspecified: Secondary | ICD-10-CM

## 2015-11-23 DIAGNOSIS — D631 Anemia in chronic kidney disease: Secondary | ICD-10-CM

## 2015-11-23 DIAGNOSIS — D500 Iron deficiency anemia secondary to blood loss (chronic): Secondary | ICD-10-CM

## 2015-11-23 DIAGNOSIS — E785 Hyperlipidemia, unspecified: Secondary | ICD-10-CM

## 2015-11-23 DIAGNOSIS — Z87891 Personal history of nicotine dependence: Secondary | ICD-10-CM

## 2015-11-23 DIAGNOSIS — I5032 Chronic diastolic (congestive) heart failure: Secondary | ICD-10-CM

## 2015-11-23 DIAGNOSIS — Z794 Long term (current) use of insulin: Secondary | ICD-10-CM

## 2015-11-23 DIAGNOSIS — I129 Hypertensive chronic kidney disease with stage 1 through stage 4 chronic kidney disease, or unspecified chronic kidney disease: Secondary | ICD-10-CM | POA: Diagnosis not present

## 2015-11-23 DIAGNOSIS — Z8673 Personal history of transient ischemic attack (TIA), and cerebral infarction without residual deficits: Secondary | ICD-10-CM

## 2015-11-23 DIAGNOSIS — R0602 Shortness of breath: Secondary | ICD-10-CM | POA: Diagnosis not present

## 2015-11-23 DIAGNOSIS — I441 Atrioventricular block, second degree: Secondary | ICD-10-CM

## 2015-11-23 DIAGNOSIS — N189 Chronic kidney disease, unspecified: Principal | ICD-10-CM

## 2015-11-23 DIAGNOSIS — I482 Chronic atrial fibrillation: Secondary | ICD-10-CM

## 2015-11-23 DIAGNOSIS — E119 Type 2 diabetes mellitus without complications: Secondary | ICD-10-CM

## 2015-11-23 MED ORDER — DIPHENHYDRAMINE HCL 25 MG PO CAPS
25.0000 mg | ORAL_CAPSULE | Freq: Once | ORAL | Status: AC
  Administered 2015-11-23: 25 mg via ORAL
  Filled 2015-11-23: qty 1

## 2015-11-23 MED ORDER — ACETAMINOPHEN 325 MG PO TABS
650.0000 mg | ORAL_TABLET | Freq: Once | ORAL | Status: AC
  Administered 2015-11-23: 650 mg via ORAL
  Filled 2015-11-23: qty 2

## 2015-11-23 MED ORDER — SODIUM CHLORIDE 0.9 % IV SOLN
250.0000 mL | Freq: Once | INTRAVENOUS | Status: AC
  Administered 2015-11-23: 250 mL via INTRAVENOUS
  Filled 2015-11-23: qty 250
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
## 2015-11-23 NOTE — Progress Notes (Signed)
Pt reports moderate fatigue/tiredness.  SOB on exertion.

## 2015-11-24 LAB — TYPE AND SCREEN
ABO/RH(D): O POS
Antibody Screen: NEGATIVE
Unit division: 0

## 2015-11-29 ENCOUNTER — Telehealth: Payer: Self-pay | Admitting: *Deleted

## 2015-11-29 NOTE — Telephone Encounter (Signed)
I got a message from [name] in [place]. That caretaker had called and pt needed to move up his lab only to next week.  I called the home and spoke to caretaker and she states that she has noticed that he is more winded and wanted to have his labs check 1 week early because of his sx.  He is one of these people that bleeds and he only got one unit of blood last week and he was 8.0 before he got it.  I spoke Mike Gip and she is agreeable to move appt up from 7/6 to 6/29 and then all the other appt will have to be moved up a week. I messaged Lattie Haw about the changes and also called Lattie Haw and told her about the changes. I then called back to pt's home and spoke to pt and he says he will tell her and be there 6/29 and get new appt card

## 2015-12-01 ENCOUNTER — Encounter (HOSPITAL_COMMUNITY): Payer: Self-pay | Admitting: Cardiovascular Disease

## 2015-12-05 DIAGNOSIS — B351 Tinea unguium: Secondary | ICD-10-CM | POA: Diagnosis not present

## 2015-12-05 DIAGNOSIS — E1140 Type 2 diabetes mellitus with diabetic neuropathy, unspecified: Secondary | ICD-10-CM | POA: Diagnosis not present

## 2015-12-05 DIAGNOSIS — Z794 Long term (current) use of insulin: Secondary | ICD-10-CM | POA: Diagnosis not present

## 2015-12-07 ENCOUNTER — Ambulatory Visit: Payer: Medicare Other | Admitting: Hematology and Oncology

## 2015-12-07 ENCOUNTER — Inpatient Hospital Stay: Payer: Medicare Other

## 2015-12-07 ENCOUNTER — Other Ambulatory Visit: Payer: Medicare Other

## 2015-12-07 DIAGNOSIS — Z79899 Other long term (current) drug therapy: Secondary | ICD-10-CM | POA: Diagnosis not present

## 2015-12-07 DIAGNOSIS — N189 Chronic kidney disease, unspecified: Secondary | ICD-10-CM | POA: Diagnosis not present

## 2015-12-07 DIAGNOSIS — D631 Anemia in chronic kidney disease: Secondary | ICD-10-CM

## 2015-12-07 DIAGNOSIS — D500 Iron deficiency anemia secondary to blood loss (chronic): Secondary | ICD-10-CM | POA: Diagnosis not present

## 2015-12-07 DIAGNOSIS — I129 Hypertensive chronic kidney disease with stage 1 through stage 4 chronic kidney disease, or unspecified chronic kidney disease: Secondary | ICD-10-CM | POA: Diagnosis not present

## 2015-12-07 DIAGNOSIS — R0602 Shortness of breath: Secondary | ICD-10-CM | POA: Diagnosis not present

## 2015-12-07 LAB — CBC WITH DIFFERENTIAL/PLATELET
Basophils Absolute: 0.1 10*3/uL (ref 0–0.1)
Basophils Relative: 1 %
Eosinophils Absolute: 0.4 10*3/uL (ref 0–0.7)
Eosinophils Relative: 7 %
HCT: 23.5 % — ABNORMAL LOW (ref 40.0–52.0)
Hemoglobin: 7.8 g/dL — ABNORMAL LOW (ref 13.0–18.0)
Lymphocytes Relative: 13 %
Lymphs Abs: 0.6 10*3/uL — ABNORMAL LOW (ref 1.0–3.6)
MCH: 28.1 pg (ref 26.0–34.0)
MCHC: 33.3 g/dL (ref 32.0–36.0)
MCV: 84.3 fL (ref 80.0–100.0)
Monocytes Absolute: 0.7 10*3/uL (ref 0.2–1.0)
Monocytes Relative: 13 %
Neutro Abs: 3.4 10*3/uL (ref 1.4–6.5)
Neutrophils Relative %: 66 %
Platelets: 218 10*3/uL (ref 150–440)
RBC: 2.79 MIL/uL — ABNORMAL LOW (ref 4.40–5.90)
RDW: 15.5 % — ABNORMAL HIGH (ref 11.5–14.5)
WBC: 5.1 10*3/uL (ref 3.8–10.6)

## 2015-12-08 ENCOUNTER — Telehealth: Payer: Self-pay | Admitting: *Deleted

## 2015-12-08 NOTE — Telephone Encounter (Signed)
Called asking for hgb results and when told it is 7.8, he replied that he needs blood. He is complaining of breathing problems and again stated he needs a transfusion

## 2015-12-08 NOTE — Telephone Encounter (Signed)
Per Dr Mike Gip can transfuse, but if needed before Monday, will have to go to hospital for it. I called pt and he states he will wait until Monday adn is expecting a call with the time

## 2015-12-09 ENCOUNTER — Other Ambulatory Visit: Payer: Self-pay | Admitting: Hematology and Oncology

## 2015-12-09 ENCOUNTER — Encounter: Payer: Self-pay | Admitting: Hematology and Oncology

## 2015-12-09 DIAGNOSIS — D500 Iron deficiency anemia secondary to blood loss (chronic): Secondary | ICD-10-CM

## 2015-12-11 ENCOUNTER — Inpatient Hospital Stay: Payer: Medicare Other

## 2015-12-11 ENCOUNTER — Inpatient Hospital Stay: Payer: Medicare Other | Attending: Hematology and Oncology

## 2015-12-11 VITALS — BP 158/82 | HR 61 | Temp 97.8°F | Resp 20

## 2015-12-11 DIAGNOSIS — D500 Iron deficiency anemia secondary to blood loss (chronic): Secondary | ICD-10-CM

## 2015-12-11 DIAGNOSIS — D631 Anemia in chronic kidney disease: Secondary | ICD-10-CM

## 2015-12-11 DIAGNOSIS — Z79899 Other long term (current) drug therapy: Secondary | ICD-10-CM | POA: Diagnosis not present

## 2015-12-11 DIAGNOSIS — N189 Chronic kidney disease, unspecified: Principal | ICD-10-CM

## 2015-12-11 LAB — CBC WITH DIFFERENTIAL/PLATELET
Basophils Absolute: 0.1 10*3/uL (ref 0–0.1)
Basophils Relative: 1 %
Eosinophils Absolute: 0.4 10*3/uL (ref 0–0.7)
Eosinophils Relative: 6 %
HCT: 24 % — ABNORMAL LOW (ref 40.0–52.0)
Hemoglobin: 8.2 g/dL — ABNORMAL LOW (ref 13.0–18.0)
Lymphocytes Relative: 12 %
Lymphs Abs: 0.7 10*3/uL — ABNORMAL LOW (ref 1.0–3.6)
MCH: 28.8 pg (ref 26.0–34.0)
MCHC: 34.1 g/dL (ref 32.0–36.0)
MCV: 84.3 fL (ref 80.0–100.0)
Monocytes Absolute: 0.7 10*3/uL (ref 0.2–1.0)
Monocytes Relative: 12 %
Neutro Abs: 3.9 10*3/uL (ref 1.4–6.5)
Neutrophils Relative %: 69 %
Platelets: 237 10*3/uL (ref 150–440)
RBC: 2.85 MIL/uL — ABNORMAL LOW (ref 4.40–5.90)
RDW: 15.9 % — ABNORMAL HIGH (ref 11.5–14.5)
WBC: 5.8 10*3/uL (ref 3.8–10.6)

## 2015-12-11 LAB — PREPARE RBC (CROSSMATCH)

## 2015-12-11 MED ORDER — DIPHENHYDRAMINE HCL 25 MG PO CAPS
25.0000 mg | ORAL_CAPSULE | Freq: Once | ORAL | Status: AC
  Administered 2015-12-11: 25 mg via ORAL
  Filled 2015-12-11: qty 1

## 2015-12-11 MED ORDER — ACETAMINOPHEN 325 MG PO TABS
650.0000 mg | ORAL_TABLET | Freq: Once | ORAL | Status: AC
  Administered 2015-12-11: 650 mg via ORAL
  Filled 2015-12-11: qty 2

## 2015-12-11 MED ORDER — SODIUM CHLORIDE 0.9 % IV SOLN
250.0000 mL | Freq: Once | INTRAVENOUS | Status: AC
  Administered 2015-12-11: 250 mL via INTRAVENOUS
  Filled 2015-12-11: qty 250

## 2015-12-12 LAB — TYPE AND SCREEN
ABO/RH(D): O POS
Antibody Screen: NEGATIVE
Unit division: 0
Unit division: 0

## 2015-12-14 ENCOUNTER — Other Ambulatory Visit: Payer: Medicare Other

## 2016-01-01 ENCOUNTER — Inpatient Hospital Stay: Payer: Medicare Other | Admitting: *Deleted

## 2016-01-01 DIAGNOSIS — Z79899 Other long term (current) drug therapy: Secondary | ICD-10-CM | POA: Diagnosis not present

## 2016-01-01 DIAGNOSIS — D500 Iron deficiency anemia secondary to blood loss (chronic): Secondary | ICD-10-CM | POA: Diagnosis not present

## 2016-01-01 DIAGNOSIS — D631 Anemia in chronic kidney disease: Secondary | ICD-10-CM

## 2016-01-01 DIAGNOSIS — D509 Iron deficiency anemia, unspecified: Secondary | ICD-10-CM

## 2016-01-01 DIAGNOSIS — N189 Chronic kidney disease, unspecified: Principal | ICD-10-CM

## 2016-01-01 LAB — CBC WITH DIFFERENTIAL/PLATELET
Basophils Absolute: 0.1 10*3/uL (ref 0–0.1)
Basophils Relative: 1 %
Eosinophils Absolute: 0.3 10*3/uL (ref 0–0.7)
Eosinophils Relative: 5 %
HCT: 26.4 % — ABNORMAL LOW (ref 40.0–52.0)
Hemoglobin: 8.5 g/dL — ABNORMAL LOW (ref 13.0–18.0)
Lymphocytes Relative: 11 %
Lymphs Abs: 0.6 10*3/uL — ABNORMAL LOW (ref 1.0–3.6)
MCH: 27.6 pg (ref 26.0–34.0)
MCHC: 32.3 g/dL (ref 32.0–36.0)
MCV: 85.4 fL (ref 80.0–100.0)
Monocytes Absolute: 0.6 10*3/uL (ref 0.2–1.0)
Monocytes Relative: 11 %
Neutro Abs: 3.8 10*3/uL (ref 1.4–6.5)
Neutrophils Relative %: 72 %
Platelets: 214 10*3/uL (ref 150–440)
RBC: 3.09 MIL/uL — ABNORMAL LOW (ref 4.40–5.90)
RDW: 16.2 % — ABNORMAL HIGH (ref 11.5–14.5)
WBC: 5.3 10*3/uL (ref 3.8–10.6)

## 2016-01-01 LAB — FERRITIN: Ferritin: 48 ng/mL (ref 24–336)

## 2016-01-01 LAB — SAMPLE TO BLOOD BANK

## 2016-01-04 ENCOUNTER — Other Ambulatory Visit: Payer: Medicare Other

## 2016-01-08 DIAGNOSIS — E119 Type 2 diabetes mellitus without complications: Secondary | ICD-10-CM | POA: Diagnosis not present

## 2016-01-08 LAB — HM DIABETES EYE EXAM

## 2016-01-11 ENCOUNTER — Encounter: Payer: Self-pay | Admitting: *Deleted

## 2016-01-11 ENCOUNTER — Other Ambulatory Visit: Payer: Medicare Other

## 2016-01-15 ENCOUNTER — Other Ambulatory Visit: Payer: Self-pay | Admitting: Internal Medicine

## 2016-01-22 ENCOUNTER — Inpatient Hospital Stay: Payer: Medicare Other | Attending: Hematology and Oncology | Admitting: *Deleted

## 2016-01-22 DIAGNOSIS — D509 Iron deficiency anemia, unspecified: Secondary | ICD-10-CM | POA: Insufficient documentation

## 2016-01-22 DIAGNOSIS — D631 Anemia in chronic kidney disease: Secondary | ICD-10-CM

## 2016-01-22 DIAGNOSIS — N189 Chronic kidney disease, unspecified: Secondary | ICD-10-CM

## 2016-01-22 LAB — CBC WITH DIFFERENTIAL/PLATELET
Basophils Absolute: 0.1 10*3/uL (ref 0–0.1)
Basophils Relative: 1 %
Eosinophils Absolute: 0.3 10*3/uL (ref 0–0.7)
Eosinophils Relative: 6 %
HCT: 24.5 % — ABNORMAL LOW (ref 40.0–52.0)
Hemoglobin: 8.2 g/dL — ABNORMAL LOW (ref 13.0–18.0)
Lymphocytes Relative: 9 %
Lymphs Abs: 0.5 10*3/uL — ABNORMAL LOW (ref 1.0–3.6)
MCH: 28.6 pg (ref 26.0–34.0)
MCHC: 33.4 g/dL (ref 32.0–36.0)
MCV: 85.4 fL (ref 80.0–100.0)
Monocytes Absolute: 0.7 10*3/uL (ref 0.2–1.0)
Monocytes Relative: 12 %
Neutro Abs: 3.7 10*3/uL (ref 1.4–6.5)
Neutrophils Relative %: 72 %
Platelets: 201 10*3/uL (ref 150–440)
RBC: 2.87 MIL/uL — ABNORMAL LOW (ref 4.40–5.90)
RDW: 16 % — ABNORMAL HIGH (ref 11.5–14.5)
WBC: 5.2 10*3/uL (ref 3.8–10.6)

## 2016-01-22 LAB — SAMPLE TO BLOOD BANK

## 2016-01-23 ENCOUNTER — Telehealth: Payer: Self-pay | Admitting: *Deleted

## 2016-01-23 ENCOUNTER — Encounter: Payer: Self-pay | Admitting: Nurse Practitioner

## 2016-01-23 NOTE — Telephone Encounter (Signed)
-----   Message from Lequita Asal, MD sent at 01/22/2016  5:02 PM EDT ----- Regarding: Please call patient  Find out about any symptoms of anemia.  I know he wants to get transfused when symptomatic.  M  ----- Message ----- From: Interface, Lab In [place] Sent: 01/22/2016   2:59 PM To: Lequita Asal, MD

## 2016-01-23 NOTE — Telephone Encounter (Signed)
Called pt and gave him hgb 8.2 and asked how is is feeling. He is fatigued and also when he gets up and moves around her is sob on exertion. Per corcoran if he was sx. He could get 1 unit of blood and he will come tom. For 1 nit at 9 am. Will let corcoran know to enter blood orders.

## 2016-01-24 ENCOUNTER — Inpatient Hospital Stay: Payer: Medicare Other

## 2016-01-24 ENCOUNTER — Other Ambulatory Visit: Payer: Self-pay | Admitting: *Deleted

## 2016-01-24 ENCOUNTER — Other Ambulatory Visit: Payer: Self-pay | Admitting: Hematology and Oncology

## 2016-01-24 VITALS — BP 149/77 | HR 65 | Temp 97.2°F | Resp 18

## 2016-01-24 DIAGNOSIS — D509 Iron deficiency anemia, unspecified: Secondary | ICD-10-CM | POA: Diagnosis not present

## 2016-01-24 DIAGNOSIS — D649 Anemia, unspecified: Secondary | ICD-10-CM

## 2016-01-24 LAB — PREPARE RBC (CROSSMATCH)

## 2016-01-24 MED ORDER — DIPHENHYDRAMINE HCL 25 MG PO CAPS
25.0000 mg | ORAL_CAPSULE | Freq: Once | ORAL | Status: AC
  Administered 2016-01-24: 25 mg via ORAL
  Filled 2016-01-24: qty 1

## 2016-01-24 MED ORDER — SODIUM CHLORIDE 0.9 % IV SOLN
250.0000 mL | Freq: Once | INTRAVENOUS | Status: AC
  Administered 2016-01-24: 250 mL via INTRAVENOUS
  Filled 2016-01-24: qty 250

## 2016-01-24 MED ORDER — ACETAMINOPHEN 325 MG PO TABS
650.0000 mg | ORAL_TABLET | Freq: Once | ORAL | Status: AC
  Administered 2016-01-24: 650 mg via ORAL
  Filled 2016-01-24: qty 2

## 2016-01-25 ENCOUNTER — Other Ambulatory Visit: Payer: Medicare Other

## 2016-01-25 LAB — TYPE AND SCREEN
ABO/RH(D): O POS
Antibody Screen: NEGATIVE
Unit division: 0

## 2016-01-29 DIAGNOSIS — M79662 Pain in left lower leg: Secondary | ICD-10-CM | POA: Diagnosis not present

## 2016-01-29 DIAGNOSIS — S8991XA Unspecified injury of right lower leg, initial encounter: Secondary | ICD-10-CM | POA: Diagnosis not present

## 2016-01-29 DIAGNOSIS — M25572 Pain in left ankle and joints of left foot: Secondary | ICD-10-CM | POA: Diagnosis not present

## 2016-01-29 DIAGNOSIS — M7989 Other specified soft tissue disorders: Secondary | ICD-10-CM | POA: Diagnosis not present

## 2016-01-29 DIAGNOSIS — S8992XA Unspecified injury of left lower leg, initial encounter: Secondary | ICD-10-CM | POA: Diagnosis not present

## 2016-01-29 DIAGNOSIS — W19XXXA Unspecified fall, initial encounter: Secondary | ICD-10-CM | POA: Diagnosis not present

## 2016-01-29 DIAGNOSIS — S99912A Unspecified injury of left ankle, initial encounter: Secondary | ICD-10-CM | POA: Diagnosis not present

## 2016-01-29 DIAGNOSIS — Y92099 Unspecified place in other non-institutional residence as the place of occurrence of the external cause: Secondary | ICD-10-CM | POA: Diagnosis not present

## 2016-01-31 ENCOUNTER — Other Ambulatory Visit: Payer: Self-pay | Admitting: Internal Medicine

## 2016-02-01 ENCOUNTER — Other Ambulatory Visit: Payer: Medicare Other

## 2016-02-05 ENCOUNTER — Telehealth: Payer: Self-pay | Admitting: *Deleted

## 2016-02-05 ENCOUNTER — Ambulatory Visit
Admission: RE | Admit: 2016-02-05 | Discharge: 2016-02-05 | Disposition: A | Discharge status: Home / Self Care | Payer: Medicare Other | Source: Ambulatory Visit | Attending: Internal Medicine | Admitting: Internal Medicine

## 2016-02-05 ENCOUNTER — Telehealth: Payer: Self-pay | Admitting: Internal Medicine

## 2016-02-05 ENCOUNTER — Other Ambulatory Visit: Payer: Self-pay | Admitting: Internal Medicine

## 2016-02-05 DIAGNOSIS — M7989 Other specified soft tissue disorders: Secondary | ICD-10-CM | POA: Insufficient documentation

## 2016-02-05 DIAGNOSIS — I82512 Chronic embolism and thrombosis of left femoral vein: Secondary | ICD-10-CM | POA: Insufficient documentation

## 2016-02-05 NOTE — Telephone Encounter (Signed)
He does not have a blood clot,  He has a hematoma measuring nearly 5 cm.  He needs to ice this for 15 minutes every 3 hours  While awake. Please send message to son  Dr Acie Fredrickson  No need to hold the Plavix since the damage was done over a week ago

## 2016-02-05 NOTE — Telephone Encounter (Signed)
Patient notified and voiced understanding.

## 2016-02-05 NOTE — Telephone Encounter (Signed)
Patient went to walk-in had X-ray but now has large hematoma to left leg and knee spoke with MD, given order [place] of left leg. Patient notified and voiced understanding.

## 2016-02-05 NOTE — Telephone Encounter (Signed)
Can you assist with appt scheduling, already has used 1130 and 430 slots for Monday and Tuesday? Thanks

## 2016-02-05 NOTE — Telephone Encounter (Signed)
Patient would like to be worked in today or tomorrow for a swollen leg.He had a fall, he went to a walk in clinic, he had no broken bones. He only wants to see Dr. Derrel Nip.  Pt contact [phone]

## 2016-02-15 ENCOUNTER — Other Ambulatory Visit: Payer: Self-pay

## 2016-02-15 ENCOUNTER — Inpatient Hospital Stay: Payer: Medicare Other | Attending: Hematology and Oncology

## 2016-02-15 ENCOUNTER — Other Ambulatory Visit: Payer: Self-pay | Admitting: *Deleted

## 2016-02-15 ENCOUNTER — Inpatient Hospital Stay (HOSPITAL_BASED_OUTPATIENT_CLINIC_OR_DEPARTMENT_OTHER): Payer: Medicare Other | Admitting: Hematology and Oncology

## 2016-02-15 VITALS — BP 159/74 | HR 66 | Temp 95.5°F | Resp 18 | Wt 224.0 lb

## 2016-02-15 DIAGNOSIS — R5383 Other fatigue: Secondary | ICD-10-CM

## 2016-02-15 DIAGNOSIS — E785 Hyperlipidemia, unspecified: Secondary | ICD-10-CM

## 2016-02-15 DIAGNOSIS — I4891 Unspecified atrial fibrillation: Secondary | ICD-10-CM

## 2016-02-15 DIAGNOSIS — I5032 Chronic diastolic (congestive) heart failure: Secondary | ICD-10-CM | POA: Diagnosis not present

## 2016-02-15 DIAGNOSIS — Z8673 Personal history of transient ischemic attack (TIA), and cerebral infarction without residual deficits: Secondary | ICD-10-CM

## 2016-02-15 DIAGNOSIS — R0602 Shortness of breath: Secondary | ICD-10-CM | POA: Diagnosis not present

## 2016-02-15 DIAGNOSIS — Z794 Long term (current) use of insulin: Secondary | ICD-10-CM | POA: Diagnosis not present

## 2016-02-15 DIAGNOSIS — D500 Iron deficiency anemia secondary to blood loss (chronic): Secondary | ICD-10-CM

## 2016-02-15 DIAGNOSIS — Z8551 Personal history of malignant neoplasm of bladder: Secondary | ICD-10-CM | POA: Diagnosis not present

## 2016-02-15 DIAGNOSIS — E119 Type 2 diabetes mellitus without complications: Secondary | ICD-10-CM | POA: Insufficient documentation

## 2016-02-15 DIAGNOSIS — M1990 Unspecified osteoarthritis, unspecified site: Secondary | ICD-10-CM

## 2016-02-15 DIAGNOSIS — N400 Enlarged prostate without lower urinary tract symptoms: Secondary | ICD-10-CM

## 2016-02-15 DIAGNOSIS — K922 Gastrointestinal hemorrhage, unspecified: Secondary | ICD-10-CM

## 2016-02-15 DIAGNOSIS — Z79899 Other long term (current) drug therapy: Secondary | ICD-10-CM | POA: Insufficient documentation

## 2016-02-15 DIAGNOSIS — R001 Bradycardia, unspecified: Secondary | ICD-10-CM

## 2016-02-15 DIAGNOSIS — I441 Atrioventricular block, second degree: Secondary | ICD-10-CM | POA: Insufficient documentation

## 2016-02-15 DIAGNOSIS — Z87891 Personal history of nicotine dependence: Secondary | ICD-10-CM | POA: Diagnosis not present

## 2016-02-15 DIAGNOSIS — D631 Anemia in chronic kidney disease: Secondary | ICD-10-CM | POA: Diagnosis not present

## 2016-02-15 DIAGNOSIS — N189 Chronic kidney disease, unspecified: Secondary | ICD-10-CM

## 2016-02-15 DIAGNOSIS — I2510 Atherosclerotic heart disease of native coronary artery without angina pectoris: Secondary | ICD-10-CM

## 2016-02-15 DIAGNOSIS — I129 Hypertensive chronic kidney disease with stage 1 through stage 4 chronic kidney disease, or unspecified chronic kidney disease: Secondary | ICD-10-CM | POA: Diagnosis not present

## 2016-02-15 DIAGNOSIS — I350 Nonrheumatic aortic (valve) stenosis: Secondary | ICD-10-CM | POA: Insufficient documentation

## 2016-02-15 LAB — CBC WITH DIFFERENTIAL/PLATELET
Basophils Absolute: 0.1 10*3/uL (ref 0–0.1)
Basophils Relative: 1 %
Eosinophils Absolute: 0.3 10*3/uL (ref 0–0.7)
Eosinophils Relative: 4 %
HCT: 25.4 % — ABNORMAL LOW (ref 40.0–52.0)
Hemoglobin: 8.4 g/dL — ABNORMAL LOW (ref 13.0–18.0)
Lymphocytes Relative: 8 %
Lymphs Abs: 0.5 10*3/uL — ABNORMAL LOW (ref 1.0–3.6)
MCH: 28.1 pg (ref 26.0–34.0)
MCHC: 33.3 g/dL (ref 32.0–36.0)
MCV: 84.5 fL (ref 80.0–100.0)
Monocytes Absolute: 0.7 10*3/uL (ref 0.2–1.0)
Monocytes Relative: 11 %
Neutro Abs: 4.6 10*3/uL (ref 1.4–6.5)
Neutrophils Relative %: 76 %
Platelets: 201 10*3/uL (ref 150–440)
RBC: 3.01 MIL/uL — ABNORMAL LOW (ref 4.40–5.90)
RDW: 15.1 % — ABNORMAL HIGH (ref 11.5–14.5)
WBC: 6 10*3/uL (ref 3.8–10.6)

## 2016-02-15 LAB — COMPREHENSIVE METABOLIC PANEL
ALT: 15 U/L — ABNORMAL LOW (ref 17–63)
AST: 23 U/L (ref 15–41)
Albumin: 3.8 g/dL (ref 3.5–5.0)
Alkaline Phosphatase: 117 U/L (ref 38–126)
Anion gap: 8 (ref 5–15)
BUN: 35 mg/dL — ABNORMAL HIGH (ref 6–20)
CO2: 23 mmol/L (ref 22–32)
Calcium: 8.7 mg/dL — ABNORMAL LOW (ref 8.9–10.3)
Chloride: 99 mmol/L — ABNORMAL LOW (ref 101–111)
Creatinine, Ser: 2.11 mg/dL — ABNORMAL HIGH (ref 0.61–1.24)
GFR calc Af Amer: 31 mL/min — ABNORMAL LOW (ref >60)
GFR calc non Af Amer: 27 mL/min — ABNORMAL LOW (ref >60)
Glucose, Bld: 104 mg/dL — ABNORMAL HIGH (ref 65–99)
Potassium: 4.9 mmol/L (ref 3.5–5.1)
Sodium: 130 mmol/L — ABNORMAL LOW (ref 135–145)
Total Bilirubin: 0.8 mg/dL (ref 0.3–1.2)
Total Protein: 6.8 g/dL (ref 6.5–8.1)

## 2016-02-15 LAB — PREPARE RBC (CROSSMATCH)

## 2016-02-15 LAB — SAMPLE TO BLOOD BANK

## 2016-02-15 LAB — FERRITIN: Ferritin: 50 ng/mL (ref 24–336)

## 2016-02-15 NOTE — Progress Notes (Signed)
Patient states he is extremely SOB with any exertion.  Very weak.
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
## 2016-02-16 ENCOUNTER — Inpatient Hospital Stay: Payer: Medicare Other

## 2016-02-16 ENCOUNTER — Ambulatory Visit: Payer: Medicare Other | Admitting: Hematology and Oncology

## 2016-02-16 ENCOUNTER — Other Ambulatory Visit: Payer: Medicare Other

## 2016-02-16 DIAGNOSIS — K922 Gastrointestinal hemorrhage, unspecified: Secondary | ICD-10-CM | POA: Diagnosis not present

## 2016-02-16 DIAGNOSIS — D631 Anemia in chronic kidney disease: Secondary | ICD-10-CM | POA: Diagnosis not present

## 2016-02-16 DIAGNOSIS — R5383 Other fatigue: Secondary | ICD-10-CM | POA: Diagnosis not present

## 2016-02-16 DIAGNOSIS — I129 Hypertensive chronic kidney disease with stage 1 through stage 4 chronic kidney disease, or unspecified chronic kidney disease: Secondary | ICD-10-CM | POA: Diagnosis not present

## 2016-02-16 DIAGNOSIS — N189 Chronic kidney disease, unspecified: Secondary | ICD-10-CM | POA: Diagnosis not present

## 2016-02-16 DIAGNOSIS — D500 Iron deficiency anemia secondary to blood loss (chronic): Secondary | ICD-10-CM | POA: Diagnosis not present

## 2016-02-16 MED ORDER — ACETAMINOPHEN 325 MG PO TABS
650.0000 mg | ORAL_TABLET | Freq: Once | ORAL | Status: AC
  Administered 2016-02-16: 650 mg via ORAL
  Filled 2016-02-16: qty 2

## 2016-02-16 MED ORDER — DIPHENHYDRAMINE HCL 25 MG PO CAPS
25.0000 mg | ORAL_CAPSULE | Freq: Once | ORAL | Status: AC
  Administered 2016-02-16: 25 mg via ORAL
  Filled 2016-02-16: qty 1

## 2016-02-16 MED ORDER — SODIUM CHLORIDE 0.9 % IV SOLN
250.0000 mL | Freq: Once | INTRAVENOUS | Status: AC
  Administered 2016-02-16: 250 mL via INTRAVENOUS
  Filled 2016-02-16: qty 250

## 2016-02-17 ENCOUNTER — Encounter: Payer: Self-pay | Admitting: Hematology and Oncology

## 2016-02-17 LAB — TYPE AND SCREEN
ABO/RH(D): O POS
Antibody Screen: NEGATIVE
Unit division: 0

## 2016-02-19 ENCOUNTER — Other Ambulatory Visit: Payer: Self-pay | Admitting: Internal Medicine

## 2016-02-19 DIAGNOSIS — Z23 Encounter for immunization: Secondary | ICD-10-CM | POA: Diagnosis not present

## 2016-02-21 ENCOUNTER — Ambulatory Visit: Payer: Medicare Other | Admitting: Nurse Practitioner

## 2016-02-22 ENCOUNTER — Telehealth: Payer: Self-pay | Admitting: *Deleted

## 2016-02-22 ENCOUNTER — Other Ambulatory Visit: Payer: Self-pay | Admitting: *Deleted

## 2016-02-22 DIAGNOSIS — D631 Anemia in chronic kidney disease: Secondary | ICD-10-CM

## 2016-02-22 DIAGNOSIS — N189 Chronic kidney disease, unspecified: Principal | ICD-10-CM

## 2016-02-22 NOTE — Telephone Encounter (Signed)
Asking for more blood to be transfused. States he is still very weak and get out of breath just walking to the bathroom. Please advise

## 2016-02-22 NOTE — Telephone Encounter (Signed)
Called pt and let him know that he can come in and get labs drawn and if he needs blood we can do the same day or the next day transfusion.  He would like it on the same day.  I let him know that the clinic does not have any available chairs for infusion tom. But could put him on for Monday 9/18 and he prefers 9 am.  I told him that it is fine the scheduler has put it on for Monday at 9 am for labs and 1 unit of blood if needed.  Also told pt we will have a wait in between lab and getting blood because his blood will have to matched for his transfusion and he is ok with waiting.

## 2016-02-22 NOTE — Telephone Encounter (Signed)
  Let's try ro get him in next week.  M

## 2016-02-23 ENCOUNTER — Other Ambulatory Visit: Payer: Medicare Other

## 2016-02-23 ENCOUNTER — Ambulatory Visit: Payer: Medicare Other | Admitting: Hematology and Oncology

## 2016-02-25 ENCOUNTER — Other Ambulatory Visit: Payer: Self-pay | Admitting: Hematology and Oncology

## 2016-02-26 ENCOUNTER — Inpatient Hospital Stay: Payer: Medicare Other

## 2016-02-26 ENCOUNTER — Other Ambulatory Visit: Payer: Self-pay | Admitting: Oncology

## 2016-02-26 ENCOUNTER — Other Ambulatory Visit: Payer: Self-pay | Admitting: *Deleted

## 2016-02-26 DIAGNOSIS — D638 Anemia in other chronic diseases classified elsewhere: Secondary | ICD-10-CM

## 2016-02-26 DIAGNOSIS — D500 Iron deficiency anemia secondary to blood loss (chronic): Secondary | ICD-10-CM | POA: Diagnosis not present

## 2016-02-26 DIAGNOSIS — D631 Anemia in chronic kidney disease: Secondary | ICD-10-CM | POA: Diagnosis not present

## 2016-02-26 DIAGNOSIS — R5383 Other fatigue: Secondary | ICD-10-CM | POA: Diagnosis not present

## 2016-02-26 DIAGNOSIS — N189 Chronic kidney disease, unspecified: Secondary | ICD-10-CM | POA: Diagnosis not present

## 2016-02-26 DIAGNOSIS — K922 Gastrointestinal hemorrhage, unspecified: Secondary | ICD-10-CM | POA: Diagnosis not present

## 2016-02-26 DIAGNOSIS — I129 Hypertensive chronic kidney disease with stage 1 through stage 4 chronic kidney disease, or unspecified chronic kidney disease: Secondary | ICD-10-CM | POA: Diagnosis not present

## 2016-02-26 LAB — CBC WITH DIFFERENTIAL/PLATELET
Basophils Absolute: 0.1 10*3/uL (ref 0–0.1)
Basophils Relative: 1 %
Eosinophils Absolute: 0.2 10*3/uL (ref 0–0.7)
Eosinophils Relative: 4 %
HCT: 25.3 % — ABNORMAL LOW (ref 40.0–52.0)
Hemoglobin: 8.5 g/dL — ABNORMAL LOW (ref 13.0–18.0)
Lymphocytes Relative: 9 %
Lymphs Abs: 0.5 10*3/uL — ABNORMAL LOW (ref 1.0–3.6)
MCH: 28.2 pg (ref 26.0–34.0)
MCHC: 33.5 g/dL (ref 32.0–36.0)
MCV: 84.2 fL (ref 80.0–100.0)
Monocytes Absolute: 0.8 10*3/uL (ref 0.2–1.0)
Monocytes Relative: 13 %
Neutro Abs: 4.6 10*3/uL (ref 1.4–6.5)
Neutrophils Relative %: 73 %
Platelets: 188 10*3/uL (ref 150–440)
RBC: 3.01 MIL/uL — ABNORMAL LOW (ref 4.40–5.90)
RDW: 15.1 % — ABNORMAL HIGH (ref 11.5–14.5)
WBC: 6.2 10*3/uL (ref 3.8–10.6)

## 2016-02-26 LAB — SAMPLE TO BLOOD BANK

## 2016-02-26 LAB — PREPARE RBC (CROSSMATCH)

## 2016-02-26 MED ORDER — SODIUM CHLORIDE 0.9 % IV SOLN
250.0000 mL | Freq: Once | INTRAVENOUS | Status: AC
  Administered 2016-02-26: 250 mL via INTRAVENOUS
  Filled 2016-02-26: qty 250

## 2016-02-26 MED ORDER — ACETAMINOPHEN 325 MG PO TABS
650.0000 mg | ORAL_TABLET | Freq: Once | ORAL | Status: AC
  Administered 2016-02-26: 650 mg via ORAL
  Filled 2016-02-26: qty 2

## 2016-02-26 MED ORDER — DIPHENHYDRAMINE HCL 50 MG/ML IJ SOLN
25.0000 mg | Freq: Once | INTRAMUSCULAR | Status: AC
  Administered 2016-02-26: 25 mg via INTRAVENOUS
  Filled 2016-02-26: qty 1

## 2016-02-27 LAB — TYPE AND SCREEN
ABO/RH(D): O POS
Antibody Screen: NEGATIVE
Unit division: 0

## 2016-02-29 ENCOUNTER — Telehealth: Payer: Self-pay | Admitting: Internal Medicine

## 2016-02-29 ENCOUNTER — Ambulatory Visit: Payer: Medicare Other | Admitting: Physician Assistant

## 2016-02-29 NOTE — Telephone Encounter (Signed)
Spoke with susan and reviewed the suggested options, she will have him try them, thanks

## 2016-02-29 NOTE — Telephone Encounter (Signed)
Please advise a recommendation for sleep aid for patient, thanks

## 2016-02-29 NOTE — Telephone Encounter (Signed)
Pt's daughter, Manuela Schwartz was in office. Pt wants to know what he can take or Dr. Derrel Nip prescribe for sleep. Pt is having a heard time sleeping. Please call Manuela Schwartz at [phone].

## 2016-02-29 NOTE — Telephone Encounter (Signed)
I cannot prescribe anything without seeing him ,  And he will need to have tried OTC remedies first:  1) melatonin 5 mg daily at 9 pm 2) tylenol PM at 1/2 hour before bedtime

## 2016-03-04 ENCOUNTER — Ambulatory Visit (INDEPENDENT_AMBULATORY_CARE_PROVIDER_SITE_OTHER): Payer: Medicare Other | Admitting: Cardiovascular Disease

## 2016-03-04 ENCOUNTER — Encounter: Payer: Self-pay | Admitting: Cardiovascular Disease

## 2016-03-04 VITALS — BP 158/58 | HR 57 | Ht 70.0 in | Wt 241.0 lb

## 2016-03-04 DIAGNOSIS — I441 Atrioventricular block, second degree: Secondary | ICD-10-CM | POA: Diagnosis not present

## 2016-03-04 DIAGNOSIS — I481 Persistent atrial fibrillation: Secondary | ICD-10-CM | POA: Diagnosis not present

## 2016-03-04 DIAGNOSIS — I4819 Other persistent atrial fibrillation: Secondary | ICD-10-CM

## 2016-03-04 DIAGNOSIS — I2510 Atherosclerotic heart disease of native coronary artery without angina pectoris: Secondary | ICD-10-CM | POA: Diagnosis not present

## 2016-03-04 DIAGNOSIS — D649 Anemia, unspecified: Secondary | ICD-10-CM

## 2016-03-04 DIAGNOSIS — Z952 Presence of prosthetic heart valve: Secondary | ICD-10-CM

## 2016-03-04 DIAGNOSIS — Z954 Presence of other heart-valve replacement: Secondary | ICD-10-CM | POA: Diagnosis not present

## 2016-03-04 DIAGNOSIS — N189 Chronic kidney disease, unspecified: Secondary | ICD-10-CM

## 2016-03-04 DIAGNOSIS — D631 Anemia in chronic kidney disease: Secondary | ICD-10-CM

## 2016-03-04 DIAGNOSIS — E1121 Type 2 diabetes mellitus with diabetic nephropathy: Secondary | ICD-10-CM

## 2016-03-04 MED ORDER — ISOSORBIDE MONONITRATE ER 60 MG PO TB24: 60.0000 mg | ORAL_TABLET | Freq: Every day | ORAL | 3 refills | Status: DC
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
## 2016-03-04 NOTE — Patient Instructions (Addendum)
Medication Instructions:   Please increase the isosorbide up to 60 mg daily  Labwork:  No new labs needed  Testing/Procedures:  No further testing at this time   Follow-Up: It was a pleasure seeing you in the office today. Please call us if you have new issues that need to be addressed before your next appt.  [phone]  Your physician wants you to follow-up in: 6 months.  You will receive a reminder letter in the mail two months in advance. If you don't receive a letter, please call our office to schedule the follow-up appointment.  If you need a refill on your cardiac medications before your next appointment, please call your pharmacy.

## 2016-03-05 ENCOUNTER — Encounter: Payer: Self-pay | Admitting: Nurse Practitioner

## 2016-03-05 DIAGNOSIS — N183 Chronic kidney disease, stage 3 (moderate): Secondary | ICD-10-CM | POA: Diagnosis not present

## 2016-03-05 DIAGNOSIS — M150 Primary generalized (osteo)arthritis: Secondary | ICD-10-CM | POA: Diagnosis not present

## 2016-03-05 DIAGNOSIS — G5602 Carpal tunnel syndrome, left upper limb: Secondary | ICD-10-CM | POA: Diagnosis not present

## 2016-03-05 DIAGNOSIS — M0579 Rheumatoid arthritis with rheumatoid factor of multiple sites without organ or systems involvement: Secondary | ICD-10-CM | POA: Diagnosis not present

## 2016-03-06 DIAGNOSIS — B351 Tinea unguium: Secondary | ICD-10-CM | POA: Diagnosis not present

## 2016-03-06 DIAGNOSIS — Z794 Long term (current) use of insulin: Secondary | ICD-10-CM | POA: Diagnosis not present

## 2016-03-06 DIAGNOSIS — E1140 Type 2 diabetes mellitus with diabetic neuropathy, unspecified: Secondary | ICD-10-CM | POA: Diagnosis not present
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
## 2016-03-07 ENCOUNTER — Encounter: Payer: Self-pay | Admitting: Nurse Practitioner

## 2016-03-07 ENCOUNTER — Ambulatory Visit (INDEPENDENT_AMBULATORY_CARE_PROVIDER_SITE_OTHER): Payer: Medicare Other | Admitting: Nurse Practitioner

## 2016-03-07 ENCOUNTER — Inpatient Hospital Stay: Payer: Medicare Other

## 2016-03-07 VITALS — BP 130/56 | HR 67 | Ht 70.0 in | Wt 260.8 lb

## 2016-03-07 DIAGNOSIS — I480 Paroxysmal atrial fibrillation: Secondary | ICD-10-CM

## 2016-03-07 DIAGNOSIS — I2510 Atherosclerotic heart disease of native coronary artery without angina pectoris: Secondary | ICD-10-CM

## 2016-03-07 MED ORDER — ASPIRIN EC 81 MG PO TBEC: 81.0000 mg | DELAYED_RELEASE_TABLET | Freq: Every day | ORAL | 3 refills | Status: AC

## 2016-03-07 NOTE — Patient Instructions (Signed)
Medication Instructions:  Your physician has recommended you make the following change in your medication:  1) Stop Plavix 2) Start Aspirin 81 mg daily   Labwork: None ordered   Testing/Procedures: None ordered   Follow-Up:   Your physician recommends that you schedule a follow-up appointment as needed   Any Other Special Instructions Will Be Listed Below (If Applicable).     If you need a refill on your cardiac medications before your next appointment, please call your pharmacy.

## 2016-03-08 ENCOUNTER — Inpatient Hospital Stay: Payer: Medicare Other

## 2016-03-08 DIAGNOSIS — R5383 Other fatigue: Secondary | ICD-10-CM | POA: Diagnosis not present

## 2016-03-08 DIAGNOSIS — N189 Chronic kidney disease, unspecified: Secondary | ICD-10-CM | POA: Diagnosis not present

## 2016-03-08 DIAGNOSIS — D500 Iron deficiency anemia secondary to blood loss (chronic): Secondary | ICD-10-CM | POA: Diagnosis not present

## 2016-03-08 DIAGNOSIS — D631 Anemia in chronic kidney disease: Secondary | ICD-10-CM | POA: Diagnosis not present

## 2016-03-08 DIAGNOSIS — I129 Hypertensive chronic kidney disease with stage 1 through stage 4 chronic kidney disease, or unspecified chronic kidney disease: Secondary | ICD-10-CM | POA: Diagnosis not present

## 2016-03-08 DIAGNOSIS — K922 Gastrointestinal hemorrhage, unspecified: Secondary | ICD-10-CM | POA: Diagnosis not present

## 2016-03-08 LAB — CBC WITH DIFFERENTIAL/PLATELET
Basophils Absolute: 0 10*3/uL (ref 0–0.1)
Basophils Relative: 1 %
Eosinophils Absolute: 0.2 10*3/uL (ref 0–0.7)
Eosinophils Relative: 5 %
HCT: 27.8 % — ABNORMAL LOW (ref 40.0–52.0)
Hemoglobin: 9.2 g/dL — ABNORMAL LOW (ref 13.0–18.0)
Lymphocytes Relative: 10 %
Lymphs Abs: 0.5 10*3/uL — ABNORMAL LOW (ref 1.0–3.6)
MCH: 28.1 pg (ref 26.0–34.0)
MCHC: 33 g/dL (ref 32.0–36.0)
MCV: 84.9 fL (ref 80.0–100.0)
Monocytes Absolute: 0.4 10*3/uL (ref 0.2–1.0)
Monocytes Relative: 9 %
Neutro Abs: 3.3 10*3/uL (ref 1.4–6.5)
Neutrophils Relative %: 75 %
Platelets: 195 10*3/uL (ref 150–440)
RBC: 3.27 MIL/uL — ABNORMAL LOW (ref 4.40–5.90)
RDW: 15.1 % — ABNORMAL HIGH (ref 11.5–14.5)
WBC: 4.5 10*3/uL (ref 3.8–10.6)

## 2016-03-08 LAB — COMPREHENSIVE METABOLIC PANEL
ALT: 14 U/L — ABNORMAL LOW (ref 17–63)
AST: 24 U/L (ref 15–41)
Albumin: 3.8 g/dL (ref 3.5–5.0)
Alkaline Phosphatase: 129 U/L — ABNORMAL HIGH (ref 38–126)
Anion gap: 8 (ref 5–15)
BUN: 39 mg/dL — ABNORMAL HIGH (ref 6–20)
CO2: 22 mmol/L (ref 22–32)
Calcium: 8.8 mg/dL — ABNORMAL LOW (ref 8.9–10.3)
Chloride: 99 mmol/L — ABNORMAL LOW (ref 101–111)
Creatinine, Ser: 2.23 mg/dL — ABNORMAL HIGH (ref 0.61–1.24)
GFR calc Af Amer: 29 mL/min — ABNORMAL LOW (ref >60)
GFR calc non Af Amer: 25 mL/min — ABNORMAL LOW (ref >60)
Glucose, Bld: 391 mg/dL — ABNORMAL HIGH (ref 65–99)
Potassium: 4.6 mmol/L (ref 3.5–5.1)
Sodium: 129 mmol/L — ABNORMAL LOW (ref 135–145)
Total Bilirubin: 0.8 mg/dL (ref 0.3–1.2)
Total Protein: 7 g/dL (ref 6.5–8.1)

## 2016-03-08 LAB — SAMPLE TO BLOOD BANK

## 2016-03-15 ENCOUNTER — Telehealth: Payer: Self-pay

## 2016-03-15 NOTE — Telephone Encounter (Signed)
Refill request for Coreg, last seen [id number], last filled jan2017.  Please advise.

## 2016-03-16 ENCOUNTER — Other Ambulatory Visit: Payer: Self-pay | Admitting: Internal Medicine

## 2016-03-16 MED ORDER — CARVEDILOL 6.25 MG PO TABS: 6.2500 mg | ORAL_TABLET | Freq: Two times a day (BID) | ORAL | 0 refills | Status: AC

## 2016-03-16 NOTE — Telephone Encounter (Signed)
REFILLED FOR 90 DAYS  

## 2016-03-16 NOTE — Addendum Note (Signed)
Addended by: [name] on: 03/16/2016 12:34 PM   Modules accepted: Orders

## 2016-03-21 ENCOUNTER — Telehealth: Payer: Self-pay | Admitting: Internal Medicine

## 2016-03-21 DIAGNOSIS — D62 Acute posthemorrhagic anemia: Secondary | ICD-10-CM

## 2016-03-21 NOTE — Telephone Encounter (Signed)
Spoke with the patient, he woke up this morning SOB, oximetry states 98-99%.  He knows that this is related to his hemoglobin.  Labs next week at the cancer center to check his hemoglobin already scheduled.   Feels better now.  Caregiver will be at the home later today, feels weak still. Willing to come an have labs drawn sooner if warranting. thanks

## 2016-03-21 NOTE — Telephone Encounter (Signed)
Cbc ordered .  If he continues to feel weak and SOB,  He can come in and have it checked.

## 2016-03-21 NOTE — Telephone Encounter (Signed)
FYI - Pt called stating that he was having SOB and wanted to make an appt. Do to the trigger word of SOB I sent the call to [facility] triage. Thank you!

## 2016-03-21 NOTE — Telephone Encounter (Signed)
Spoke with the caregiver, she will bring him in tomorrow to have it drawn at some point.

## 2016-03-22 ENCOUNTER — Telehealth: Payer: Self-pay

## 2016-03-22 ENCOUNTER — Other Ambulatory Visit: Payer: Medicare Other

## 2016-03-22 ENCOUNTER — Telehealth: Payer: Self-pay | Admitting: Internal Medicine

## 2016-03-22 ENCOUNTER — Emergency Department: Payer: Medicare Other

## 2016-03-22 ENCOUNTER — Inpatient Hospital Stay (HOSPITAL_COMMUNITY)
Admit: 2016-03-22 | Discharge: 2016-03-22 | Disposition: A | Discharge status: Home / Self Care | Payer: Medicare Other | Attending: Internal Medicine | Admitting: Internal Medicine

## 2016-03-22 ENCOUNTER — Inpatient Hospital Stay
Admission: EM | Admit: 2016-03-22 | Discharge: 2016-03-24 | DRG: 291 | Disposition: A | Discharge status: Home Health | Payer: Medicare Other | Attending: Internal Medicine | Admitting: Internal Medicine

## 2016-03-22 ENCOUNTER — Other Ambulatory Visit: Payer: Self-pay

## 2016-03-22 ENCOUNTER — Encounter: Payer: Self-pay | Admitting: Emergency Medicine

## 2016-03-22 DIAGNOSIS — H3530 Unspecified macular degeneration: Secondary | ICD-10-CM | POA: Diagnosis present

## 2016-03-22 DIAGNOSIS — Z951 Presence of aortocoronary bypass graft: Secondary | ICD-10-CM

## 2016-03-22 DIAGNOSIS — K219 Gastro-esophageal reflux disease without esophagitis: Secondary | ICD-10-CM | POA: Diagnosis present

## 2016-03-22 DIAGNOSIS — I130 Hypertensive heart and chronic kidney disease with heart failure and stage 1 through stage 4 chronic kidney disease, or unspecified chronic kidney disease: Secondary | ICD-10-CM | POA: Diagnosis not present

## 2016-03-22 DIAGNOSIS — E1122 Type 2 diabetes mellitus with diabetic chronic kidney disease: Secondary | ICD-10-CM | POA: Diagnosis present

## 2016-03-22 DIAGNOSIS — M6281 Muscle weakness (generalized): Secondary | ICD-10-CM

## 2016-03-22 DIAGNOSIS — N400 Enlarged prostate without lower urinary tract symptoms: Secondary | ICD-10-CM | POA: Diagnosis present

## 2016-03-22 DIAGNOSIS — I4891 Unspecified atrial fibrillation: Secondary | ICD-10-CM | POA: Diagnosis not present

## 2016-03-22 DIAGNOSIS — R062 Wheezing: Secondary | ICD-10-CM | POA: Diagnosis not present

## 2016-03-22 DIAGNOSIS — Z8673 Personal history of transient ischemic attack (TIA), and cerebral infarction without residual deficits: Secondary | ICD-10-CM

## 2016-03-22 DIAGNOSIS — I509 Heart failure, unspecified: Secondary | ICD-10-CM

## 2016-03-22 DIAGNOSIS — M1990 Unspecified osteoarthritis, unspecified site: Secondary | ICD-10-CM | POA: Diagnosis present

## 2016-03-22 DIAGNOSIS — D509 Iron deficiency anemia, unspecified: Secondary | ICD-10-CM | POA: Diagnosis present

## 2016-03-22 DIAGNOSIS — N184 Chronic kidney disease, stage 4 (severe): Secondary | ICD-10-CM | POA: Diagnosis present

## 2016-03-22 DIAGNOSIS — Z87891 Personal history of nicotine dependence: Secondary | ICD-10-CM

## 2016-03-22 DIAGNOSIS — I5033 Acute on chronic diastolic (congestive) heart failure: Secondary | ICD-10-CM | POA: Diagnosis not present

## 2016-03-22 DIAGNOSIS — Z953 Presence of xenogenic heart valve: Secondary | ICD-10-CM

## 2016-03-22 DIAGNOSIS — I5031 Acute diastolic (congestive) heart failure: Secondary | ICD-10-CM

## 2016-03-22 DIAGNOSIS — R0602 Shortness of breath: Secondary | ICD-10-CM

## 2016-03-22 DIAGNOSIS — Z823 Family history of stroke: Secondary | ICD-10-CM

## 2016-03-22 DIAGNOSIS — E785 Hyperlipidemia, unspecified: Secondary | ICD-10-CM | POA: Diagnosis present

## 2016-03-22 DIAGNOSIS — Z833 Family history of diabetes mellitus: Secondary | ICD-10-CM | POA: Diagnosis not present

## 2016-03-22 DIAGNOSIS — I2510 Atherosclerotic heart disease of native coronary artery without angina pectoris: Secondary | ICD-10-CM | POA: Diagnosis present

## 2016-03-22 DIAGNOSIS — I110 Hypertensive heart disease with heart failure: Secondary | ICD-10-CM | POA: Diagnosis not present

## 2016-03-22 DIAGNOSIS — E871 Hypo-osmolality and hyponatremia: Secondary | ICD-10-CM | POA: Diagnosis present

## 2016-03-22 DIAGNOSIS — I482 Chronic atrial fibrillation: Secondary | ICD-10-CM | POA: Diagnosis present

## 2016-03-22 DIAGNOSIS — Z8551 Personal history of malignant neoplasm of bladder: Secondary | ICD-10-CM | POA: Diagnosis not present

## 2016-03-22 DIAGNOSIS — Z8546 Personal history of malignant neoplasm of prostate: Secondary | ICD-10-CM

## 2016-03-22 DIAGNOSIS — Z794 Long term (current) use of insulin: Secondary | ICD-10-CM

## 2016-03-22 DIAGNOSIS — D649 Anemia, unspecified: Secondary | ICD-10-CM

## 2016-03-22 LAB — COMPREHENSIVE METABOLIC PANEL
ALK PHOS: 113 U/L (ref 38–126)
ALT: 15 U/L — AB (ref 17–63)
AST: 23 U/L (ref 15–41)
Albumin: 3.6 g/dL (ref 3.5–5.0)
Anion gap: 9 (ref 5–15)
BILIRUBIN TOTAL: 0.3 mg/dL (ref 0.3–1.2)
BUN: 40 mg/dL — AB (ref 6–20)
CALCIUM: 8.8 mg/dL — AB (ref 8.9–10.3)
CO2: 22 mmol/L (ref 22–32)
CREATININE: 2.14 mg/dL — AB (ref 0.61–1.24)
Chloride: 99 mmol/L — ABNORMAL LOW (ref 101–111)
GFR calc Af Amer: 30 mL/min — ABNORMAL LOW (ref >60)
GFR, EST NON AFRICAN AMERICAN: 26 mL/min — AB (ref >60)
GLUCOSE: 255 mg/dL — AB (ref 65–99)
POTASSIUM: 4.8 mmol/L (ref 3.5–5.1)
Sodium: 130 mmol/L — ABNORMAL LOW (ref 135–145)
TOTAL PROTEIN: 6.6 g/dL (ref 6.5–8.1)

## 2016-03-22 LAB — CBC WITH DIFFERENTIAL/PLATELET
BASOS ABS: 0 10*3/uL (ref 0–0.1)
Basophils Relative: 1 %
Eosinophils Absolute: 0.3 10*3/uL (ref 0–0.7)
Eosinophils Relative: 4 %
HEMATOCRIT: 23.8 % — AB (ref 40.0–52.0)
HEMOGLOBIN: 8.1 g/dL — AB (ref 13.0–18.0)
LYMPHS PCT: 6 %
Lymphs Abs: 0.4 10*3/uL — ABNORMAL LOW (ref 1.0–3.6)
MCH: 29.3 pg (ref 26.0–34.0)
MCHC: 33.9 g/dL (ref 32.0–36.0)
MCV: 86.4 fL (ref 80.0–100.0)
MONO ABS: 0.7 10*3/uL (ref 0.2–1.0)
Monocytes Relative: 11 %
NEUTROS ABS: 5 10*3/uL (ref 1.4–6.5)
NEUTROS PCT: 78 %
Platelets: 192 10*3/uL (ref 150–440)
RBC: 2.76 MIL/uL — ABNORMAL LOW (ref 4.40–5.90)
RDW: 15.8 % — AB (ref 11.5–14.5)
WBC: 6.4 10*3/uL (ref 3.8–10.6)

## 2016-03-22 LAB — ECHOCARDIOGRAM COMPLETE
Height: 70 in
Weight: 4160 oz

## 2016-03-22 LAB — TROPONIN I
Troponin I: <0.03 ng/mL (ref <0.03)
Troponin I: 0.03 ng/mL (ref <0.03)

## 2016-03-22 LAB — GLUCOSE, CAPILLARY
GLUCOSE-CAPILLARY: 225 mg/dL — AB (ref 65–99)
Glucose-Capillary: 103 mg/dL — ABNORMAL HIGH (ref 65–99)

## 2016-03-22 MED ORDER — ESCITALOPRAM OXALATE 10 MG PO TABS
10.0000 mg | ORAL_TABLET | Freq: Every day | ORAL | Status: DC
  Administered 2016-03-22 – 2016-03-23 (×2): 10 mg via ORAL
  Filled 2016-03-22 (×2): qty 1

## 2016-03-22 MED ORDER — PANTOPRAZOLE SODIUM 40 MG PO TBEC
40.0000 mg | DELAYED_RELEASE_TABLET | Freq: Every day | ORAL | Status: DC
  Administered 2016-03-23 – 2016-03-24 (×2): 40 mg via ORAL
  Filled 2016-03-22 (×2): qty 1

## 2016-03-22 MED ORDER — GUAIFENESIN-DM 100-10 MG/5ML PO SYRP
5.0000 mL | ORAL_SOLUTION | ORAL | Status: DC | PRN
  Administered 2016-03-22 – 2016-03-23 (×2): 5 mL via ORAL
  Filled 2016-03-22 (×2): qty 5

## 2016-03-22 MED ORDER — INSULIN GLARGINE 100 UNIT/ML ~~LOC~~ SOLN
40.0000 [IU] | SUBCUTANEOUS | Status: DC
  Administered 2016-03-23: 40 [IU] via SUBCUTANEOUS
  Filled 2016-03-22: qty 0.4

## 2016-03-22 MED ORDER — FUROSEMIDE 10 MG/ML IJ SOLN
40.0000 mg | Freq: Once | INTRAMUSCULAR | Status: DC
  Filled 2016-03-22: qty 4

## 2016-03-22 MED ORDER — ADULT MULTIVITAMIN W/MINERALS CH
1.0000 | ORAL_TABLET | Freq: Every day | ORAL | Status: DC
  Administered 2016-03-23 – 2016-03-24 (×2): 1 via ORAL
  Filled 2016-03-22 (×2): qty 1

## 2016-03-22 MED ORDER — HYDROXYCHLOROQUINE SULFATE 200 MG PO TABS
200.0000 mg | ORAL_TABLET | Freq: Every day | ORAL | Status: DC
  Administered 2016-03-23 – 2016-03-24 (×2): 200 mg via ORAL
  Filled 2016-03-22 (×2): qty 1

## 2016-03-22 MED ORDER — FUROSEMIDE 10 MG/ML IJ SOLN
60.0000 mg | Freq: Two times a day (BID) | INTRAMUSCULAR | Status: DC
  Administered 2016-03-23: 60 mg via INTRAVENOUS
  Filled 2016-03-22: qty 6

## 2016-03-22 MED ORDER — ISOSORBIDE MONONITRATE ER 30 MG PO TB24
30.0000 mg | ORAL_TABLET | Freq: Every day | ORAL | Status: DC
Start: 2016-03-23 — End: 2016-03-24
  Administered 2016-03-23 – 2016-03-24 (×2): 30 mg via ORAL
  Filled 2016-03-22 (×2): qty 1

## 2016-03-22 MED ORDER — SODIUM CHLORIDE 0.9 % IV SOLN
400.0000 mg | Freq: Once | INTRAVENOUS | Status: AC
  Administered 2016-03-22: 400 mg via INTRAVENOUS
  Filled 2016-03-22: qty 20

## 2016-03-22 MED ORDER — CALCITRIOL 0.25 MCG PO CAPS
0.2500 ug | ORAL_CAPSULE | Freq: Every day | ORAL | Status: DC
  Administered 2016-03-23 – 2016-03-24 (×2): 0.25 ug via ORAL
  Filled 2016-03-22 (×2): qty 1

## 2016-03-22 MED ORDER — ACETAMINOPHEN 325 MG PO TABS: 650.0000 mg | ORAL_TABLET | Freq: Four times a day (QID) | ORAL | Status: DC | PRN

## 2016-03-22 MED ORDER — ACETAMINOPHEN 650 MG RE SUPP: 650.0000 mg | Freq: Four times a day (QID) | RECTAL | Status: DC | PRN

## 2016-03-22 MED ORDER — VITAMIN D 1000 UNITS PO TABS
1000.0000 [IU] | ORAL_TABLET | Freq: Every day | ORAL | Status: DC
  Administered 2016-03-23 – 2016-03-24 (×2): 1000 [IU] via ORAL
  Filled 2016-03-22 (×2): qty 1

## 2016-03-22 MED ORDER — ENOXAPARIN SODIUM 40 MG/0.4ML ~~LOC~~ SOLN
40.0000 mg | SUBCUTANEOUS | Status: DC
  Administered 2016-03-22 – 2016-03-23 (×2): 40 mg via SUBCUTANEOUS
  Filled 2016-03-22 (×2): qty 0.4

## 2016-03-22 MED ORDER — FERROUS SULFATE 325 (65 FE) MG PO TABS
325.0000 mg | ORAL_TABLET | Freq: Two times a day (BID) | ORAL | Status: DC
  Administered 2016-03-23 – 2016-03-24 (×3): 325 mg via ORAL
  Filled 2016-03-22 (×3): qty 1

## 2016-03-22 MED ORDER — INSULIN ASPART 100 UNIT/ML ~~LOC~~ SOLN
0.0000 [IU] | Freq: Three times a day (TID) | SUBCUTANEOUS | Status: DC
  Administered 2016-03-23 (×2): 5 [IU] via SUBCUTANEOUS
  Administered 2016-03-24: 3 [IU] via SUBCUTANEOUS
  Administered 2016-03-24: 5 [IU] via SUBCUTANEOUS
  Filled 2016-03-22 (×4): qty 5

## 2016-03-22 MED ORDER — INSULIN ASPART 100 UNIT/ML ~~LOC~~ SOLN
0.0000 [IU] | Freq: Every day | SUBCUTANEOUS | Status: DC
  Administered 2016-03-22: 2 [IU] via SUBCUTANEOUS
  Administered 2016-03-23: 8 [IU] via SUBCUTANEOUS
  Filled 2016-03-22: qty 4
  Filled 2016-03-22: qty 2
  Filled 2016-03-22: qty 4

## 2016-03-22 MED ORDER — CARVEDILOL 6.25 MG PO TABS
6.2500 mg | ORAL_TABLET | Freq: Two times a day (BID) | ORAL | Status: DC
  Administered 2016-03-22 – 2016-03-24 (×4): 6.25 mg via ORAL
  Filled 2016-03-22 (×4): qty 1

## 2016-03-22 MED ORDER — FUROSEMIDE 10 MG/ML IJ SOLN
100.0000 mg | Freq: Once | INTRAMUSCULAR | Status: AC
  Administered 2016-03-22: 100 mg via INTRAVENOUS
  Filled 2016-03-22: qty 10

## 2016-03-22 MED ORDER — VITAMIN C 500 MG PO TABS
500.0000 mg | ORAL_TABLET | Freq: Every day | ORAL | Status: DC
  Administered 2016-03-23 – 2016-03-24 (×2): 500 mg via ORAL
  Filled 2016-03-22 (×2): qty 1

## 2016-03-22 MED ORDER — HYDROCODONE-ACETAMINOPHEN 5-325 MG PO TABS: 1.0000 | ORAL_TABLET | Freq: Every day | ORAL | Status: DC | PRN

## 2016-03-22 MED ORDER — DOCUSATE SODIUM 100 MG PO CAPS
100.0000 mg | ORAL_CAPSULE | Freq: Every day | ORAL | Status: DC
  Administered 2016-03-24: 100 mg via ORAL
  Filled 2016-03-22 (×2): qty 1

## 2016-03-22 MED ORDER — ASPIRIN EC 81 MG PO TBEC
81.0000 mg | DELAYED_RELEASE_TABLET | Freq: Every day | ORAL | Status: DC
  Administered 2016-03-23 – 2016-03-24 (×2): 81 mg via ORAL
  Filled 2016-03-22 (×2): qty 1

## 2016-03-22 MED ORDER — IPRATROPIUM-ALBUTEROL 0.5-2.5 (3) MG/3ML IN SOLN
3.0000 mL | Freq: Four times a day (QID) | RESPIRATORY_TRACT | Status: DC
  Administered 2016-03-22 – 2016-03-23 (×3): 3 mL via RESPIRATORY_TRACT
  Filled 2016-03-22 (×3): qty 3

## 2016-03-22 MED ORDER — OCUVITE-LUTEIN PO CAPS
1.0000 | ORAL_CAPSULE | Freq: Two times a day (BID) | ORAL | Status: DC
  Administered 2016-03-23 – 2016-03-24 (×3): 1 via ORAL
  Filled 2016-03-22 (×3): qty 1

## 2016-03-22 MED ORDER — BUDESONIDE 0.25 MG/2ML IN SUSP
0.2500 mg | Freq: Two times a day (BID) | RESPIRATORY_TRACT | Status: DC
  Administered 2016-03-22 – 2016-03-24 (×4): 0.25 mg via RESPIRATORY_TRACT
  Filled 2016-03-22 (×4): qty 2

## 2016-03-22 MED ORDER — TAMSULOSIN HCL 0.4 MG PO CAPS
0.4000 mg | ORAL_CAPSULE | Freq: Every day | ORAL | Status: DC
  Administered 2016-03-22 – 2016-03-23 (×2): 0.4 mg via ORAL
  Filled 2016-03-22 (×2): qty 1

## 2016-03-22 NOTE — Addendum Note (Signed)
Addended by: Leeanne Rio on: 03/22/2016 11:47 AM   Modules accepted: Orders

## 2016-03-22 NOTE — Progress Notes (Signed)
Patient to Echo cardiogram.

## 2016-03-22 NOTE — ED Notes (Signed)
REDS VEST READING= 41 CHEST RULER=6  VEST FITTING TASKS: POSTURE=hunched HEIGHT MARKER=t CENTER STRIP=1 inch shifted  COMMENTS:approved by [employer]

## 2016-03-22 NOTE — ED Notes (Signed)
Patient states that he has had increased shortness of breath the past few days. States that he is unable to more than a few feet without getting short of breath. Patient states that he feels weaker than normal.
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
## 2016-03-22 NOTE — ED Triage Notes (Signed)
Pt sent over from PCP for further eval of shortness of breath. Worse the last three days

## 2016-03-22 NOTE — Progress Notes (Signed)
82 yo male admitted from the ED with SOB, excessive fluid and CHF exacerbation. Alert and oriented. Denies pain.  Lung sounds are diminished.  Has pitting edema of both ankles.  Oriented to room functions, and unit routines.

## 2016-03-22 NOTE — Telephone Encounter (Signed)
Patient Came into lab SOB and disoriented , unable to speak In full sentence until rested. Drew CBC and was sent with patient to ER.   As ordered by MD, patient 02 sats as arrived were 92% on room air. Called ED and notified RN of patient arrival and HX.
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
## 2016-03-22 NOTE — Telephone Encounter (Signed)
See additional note from [facility], regarding patient.  Patient came in for Lab draw due to triage call yesterday.  Patient has had increased SOB in the past weeks and progressively worse over past three days.  Called yesterday with similar symptoms with Oxygen saturations in the high 90%.  Today Caregiver noticed remarkable changes with SOB and while getting labs drawn disoriented, sats in the lower 90% range.  Brought to nurse room and checked vitals 118/50, HR 60, Oxygen Sats up to 97%.  Lungs sound clear in bilateral fields, heart rate regular.  No edema noted.  Patient has difficulty with talking due to shortness of breath.  Patient has bruising on bilateral upper extremities noted.  Caregiver states this is better then it was.  Discussed with PCP and advised that we need him to go to the ED to have Stat labs drawn and interventions as needed as in the office we are limited on what we can provide him.  He agreed and LPN in the office called ahead to ED charge to make them aware of the patient coming. Thanks

## 2016-03-23 DIAGNOSIS — I5033 Acute on chronic diastolic (congestive) heart failure: Secondary | ICD-10-CM

## 2016-03-23 LAB — BASIC METABOLIC PANEL
Anion gap: 9 (ref 5–15)
BUN: 43 mg/dL — AB (ref 6–20)
CHLORIDE: 98 mmol/L — AB (ref 101–111)
CO2: 26 mmol/L (ref 22–32)
Calcium: 8.7 mg/dL — ABNORMAL LOW (ref 8.9–10.3)
Creatinine, Ser: 2.12 mg/dL — ABNORMAL HIGH (ref 0.61–1.24)
GFR calc Af Amer: 31 mL/min — ABNORMAL LOW (ref >60)
GFR calc non Af Amer: 27 mL/min — ABNORMAL LOW (ref >60)
Glucose, Bld: 323 mg/dL — ABNORMAL HIGH (ref 65–99)
POTASSIUM: 4.5 mmol/L (ref 3.5–5.1)
SODIUM: 133 mmol/L — AB (ref 135–145)

## 2016-03-23 LAB — GLUCOSE, CAPILLARY
GLUCOSE-CAPILLARY: 259 mg/dL — AB (ref 65–99)
GLUCOSE-CAPILLARY: 280 mg/dL — AB (ref 65–99)
GLUCOSE-CAPILLARY: 246 mg/dL — AB (ref 65–99)
GLUCOSE-CAPILLARY: 303 mg/dL — AB (ref 65–99)
Glucose-Capillary: 326 mg/dL — ABNORMAL HIGH (ref 65–99)

## 2016-03-23 LAB — CBC
HEMATOCRIT: 24.9 % — AB (ref 40.0–52.0)
Hemoglobin: 8.2 g/dL — ABNORMAL LOW (ref 13.0–18.0)
MCH: 28.4 pg (ref 26.0–34.0)
MCHC: 32.8 g/dL (ref 32.0–36.0)
MCV: 86.6 fL (ref 80.0–100.0)
Platelets: 184 10*3/uL (ref 150–440)
RBC: 2.88 MIL/uL — AB (ref 4.40–5.90)
RDW: 15.6 % — AB (ref 11.5–14.5)
WBC: 6.2 10*3/uL (ref 3.8–10.6)

## 2016-03-23 MED ORDER — INSULIN ASPART 100 UNIT/ML ~~LOC~~ SOLN
8.0000 [IU] | Freq: Once | SUBCUTANEOUS | Status: AC
  Administered 2016-03-23: 8 [IU] via SUBCUTANEOUS
  Filled 2016-03-23: qty 8

## 2016-03-23 MED ORDER — INSULIN ASPART 100 UNIT/ML ~~LOC~~ SOLN: 8.0000 [IU] | Freq: Once | SUBCUTANEOUS | Status: DC

## 2016-03-23 MED ORDER — AMLODIPINE BESYLATE 5 MG PO TABS
5.0000 mg | ORAL_TABLET | Freq: Every day | ORAL | Status: DC
  Administered 2016-03-23 – 2016-03-24 (×2): 5 mg via ORAL
  Filled 2016-03-23 (×2): qty 1

## 2016-03-23 MED ORDER — INSULIN GLARGINE 100 UNIT/ML ~~LOC~~ SOLN
50.0000 [IU] | SUBCUTANEOUS | Status: DC
  Administered 2016-03-24: 50 [IU] via SUBCUTANEOUS
  Filled 2016-03-23: qty 0.5

## 2016-03-23 MED ORDER — FUROSEMIDE 10 MG/ML IJ SOLN
40.0000 mg | Freq: Two times a day (BID) | INTRAMUSCULAR | Status: DC
  Administered 2016-03-23: 40 mg via INTRAVENOUS
  Filled 2016-03-23: qty 4

## 2016-03-23 MED ORDER — IPRATROPIUM-ALBUTEROL 0.5-2.5 (3) MG/3ML IN SOLN: 3.0000 mL | Freq: Four times a day (QID) | RESPIRATORY_TRACT | Status: DC | PRN

## 2016-03-23 NOTE — Progress Notes (Signed)
Patient's CBG=303 and he was supposed to get 4 units of Novolog but refused, stated," at home I take 8 units when my blood sugar is over 300." Dr. Ara Kussmaul notified with a new order for 8 unit  of novolog x 1 dose. Patient gave the RN a thumb's up when he was informed of   the new order. Will continue to monitor.

## 2016-03-23 NOTE — Progress Notes (Addendum)
05:32 am : Patients blood sugar 326 this morning. Pt has lantus scheduled this morning but does not have any correction scale insulin ordered at this time. Per pt, he would take 8 units of novolog if he were at home. MD notified. Awaiting call back.    Update 05:49 am : Verbal order given from MD Pyreddy to give 8 units once. RN to place order, and administer as instructed.

## 2016-03-23 NOTE — Progress Notes (Signed)
Patient ID: JOSHAWN SETIAWAN, male   DOB: [birth year], 82 y.o.   MRN: [id number]  [employer] PROGRESS NOTE  Stosh Slavich Deas [id number] DOB: 1927-12-08 DOA: 03/22/2016 PCP: [name], MD  HPI/Subjective: Patient feeling much better today than he did yesterday. Yesterday he couldn't breathe. Today he was able to walk around the nursing station with some shortness of breath but much improved since yesterday. Still with some cough but nonproductive. No complaints of chest pain.  Objective: Vitals:   03/23/16 0947 03/23/16 1121  BP:  (!) 143/62  Pulse: (!) 106 67  Resp:  16  Temp:  98.1 F (36.7 C)    Filed Weights   03/22/16 1216 03/23/16 0441  Weight: 117.9 kg (260 lb) 108.4 kg (238 lb 14.4 oz)    ROS: Review of Systems  Constitutional: Negative for chills and fever.  Eyes: Negative for blurred vision.  Respiratory: Positive for cough and shortness of breath.   Cardiovascular: Negative for chest pain.  Gastrointestinal: Negative for abdominal pain, constipation, diarrhea, nausea and vomiting.  Genitourinary: Negative for dysuria.  Musculoskeletal: Negative for joint pain.  Neurological: Negative for dizziness and headaches.   Exam: Physical Exam  Constitutional: He is oriented to person, place, and time.  HENT:  Nose: No mucosal edema.  Mouth/Throat: No oropharyngeal exudate or posterior oropharyngeal edema.  Eyes: Conjunctivae, EOM and lids are normal. Pupils are equal, round, and reactive to light.  Neck: No JVD present. Carotid bruit is not present. No edema present. No thyroid mass and no thyromegaly present.  Cardiovascular: S1 normal and S2 normal.  An irregularly irregular rhythm present. Exam reveals no gallop.   Murmur heard.  Systolic murmur is present with a grade of 4/6  Pulses:      Dorsalis pedis pulses are 2+ on the right side, and 2+ on the left side.  Respiratory: No respiratory distress. He has decreased breath sounds in the right lower field  and the left lower field. He has no wheezes. He has no rhonchi. He has rales in the right lower field and the left lower field.  GI: Soft. Bowel sounds are normal. There is no tenderness.  Musculoskeletal:       Right ankle: He exhibits swelling.       Left ankle: He exhibits swelling.  Lymphadenopathy:    He has no cervical adenopathy.  Neurological: He is alert and oriented to person, place, and time. No cranial nerve deficit.  Skin: Skin is warm. No rash noted. Nails show no clubbing.  Psychiatric: He has a normal mood and affect.      Data Reviewed: Basic Metabolic Panel:  Recent Labs Lab 03/22/16 1225 03/23/16 0517  NA 130* 133*  K 4.8 4.5  CL 99* 98*  CO2 22 26  GLUCOSE 255* 323*  BUN 40* 43*  CREATININE 2.14* 2.12*  CALCIUM 8.8* 8.7*   Liver Function Tests:  Recent Labs Lab 03/22/16 1225  AST 23  ALT 15*  ALKPHOS 113  BILITOT 0.3  PROT 6.6  ALBUMIN 3.6    CBC:  Recent Labs Lab 03/22/16 1225 03/23/16 0517  WBC 6.4 6.2  NEUTROABS 5.0  --   HGB 8.1* 8.2*  HCT 23.8* 24.9*  MCV 86.4 86.6  PLT 192 184   Cardiac Enzymes:  Recent Labs Lab 03/22/16 1225 03/22/16 1755 03/22/16 2112  TROPONINI <0.03 <0.03 0.03*   BNP (last 3 results)  Recent Labs  04/13/15 0905 07/29/15 0005  BNP 247.2* 314.2*  ProBNP (last 3 results)  Recent Labs  07/10/15 1330  PROBNP 211.0*    CBG:  Recent Labs Lab 03/22/16 1726 03/22/16 2110 03/23/16 0519 03/23/16 0728 03/23/16 1122  GLUCAP 103* 225* 326* 259* 280*     Studies: Dg Chest 2 View  Result Date: 03/22/2016 CLINICAL DATA:  Shortness of Breath EXAM: CHEST  2 VIEW COMPARISON:  07/10/2015 FINDINGS: Prior median sternotomy and valve replacement. Mild cardiomegaly. No confluent opacities. Small bilateral effusions. No overt edema. IMPRESSION: Cardiomegaly.  Small bilateral effusions.  No overt failure. Electronically Signed   By: Rolm Baptise M.D.   On: 03/22/2016 12:57    Scheduled Meds: .  amLODipine  5 mg Oral Daily  . aspirin EC  81 mg Oral Daily  . budesonide (PULMICORT) nebulizer solution  0.25 mg Nebulization BID  . calcitRIOL  0.25 mcg Oral Daily  . carvedilol  6.25 mg Oral BID  . cholecalciferol  1,000 Units Oral Daily  . docusate sodium  100 mg Oral Daily  . enoxaparin (LOVENOX) injection  40 mg Subcutaneous Q24H  . escitalopram  10 mg Oral QHS  . ferrous sulfate  325 mg Oral BID WC  . furosemide  40 mg Intravenous BID  . hydroxychloroquine  200 mg Oral Daily  . insulin aspart  0-5 Units Subcutaneous QHS  . insulin aspart  0-9 Units Subcutaneous TID WC  . [START ON 03/24/2016] insulin glargine  50 Units Subcutaneous [id number]  . isosorbide mononitrate  30 mg Oral Daily  . multivitamin with minerals  1 tablet Oral Daily  . multivitamin-lutein  1 capsule Oral BID  . pantoprazole  40 mg Oral Daily  . tamsulosin  0.4 mg Oral QPC supper  . vitamin C  500 mg Oral Daily    Assessment/Plan:  1. Acute diastolic congestive heart failure with bilateral pleural effusions. I gave 1 dose of Lasix 100 mg IV 1 last evening. I ordered 60 mg IV twice a day but cardiology decreased at the 40 mg IV twice a day. Repeat an echocardiogram. Appreciate cardiology consult. Daily weights. 2. Wheeze. Likely cardiac in nature. I did give nebulizer treatments. 3. Atrial fibrillation with recent GI bleed. Aspirin only for anticoagulation. On Coreg for rate control. Since blood pressure is a little bit better I can up the Coreg dose. 4. Iron deficiency anemia. Continue oral iron. Status post 1 dose of 400 mg IV Venofer 5. History of CVA on aspirin 6. History of aortic stenosis status post valve surgery. 7. Chronic kidney disease stage IV. Watch closely with diuresis. 8. Type 2 diabetes mellitus. Continue Lantus and sliding scale 9. History of prostate cancer 10. History of macular degeneration on PreserVision 11. GERD on Protonix 12. Hyponatremia secondary to CHF. Sodium a little bit better  today.  Code Status:     Code Status Orders        Start     Ordered   03/22/16 1548  Full code  Continuous     03/22/16 1548    Code Status History    Date Active Date Inactive Code Status Order ID Comments User Context   09/19/2015  7:27 PM 09/21/2015  5:47 PM Full Code [id number]  Lytle Butte, MD ED   07/28/2015  6:39 PM 07/31/2015  4:57 PM Full Code [id number]  [name] Costa, MD ED   07/10/2015 11:37 PM 07/12/2015 11:15 PM Full Code [id number]  Rise Patience, MD Inpatient   04/12/2015  7:05 PM 04/17/2015  7:10 PM Full Code [id number]  Geradine Girt, DO Inpatient   06/29/2014 12:24 PM 07/09/2014  2:15 PM Full Code [id number]  Bary Leriche, PA-C Inpatient    Advance Directive Documentation   Flowsheet Row Most Recent Value  Type of Advance Directive  Living will, Healthcare Power of Attorney  Pre-existing out of facility DNR order (yellow form or pink MOST form)  No data  "MOST" Form in Place?  No data     Family Communication: Left message for sun Disposition Plan: Home in the next day or so depending on clinical course  Consultants:  Cardiology  Time spent: 25 minutes  [place], [place]
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
## 2016-03-23 NOTE — Evaluation (Signed)
Physical Therapy Evaluation Patient Details [name] MRN: [id number] DOB: 06/17/1927 Today's Date: 03/23/2016   History of Present Illness  [name] is an 82yo male who comes to [facility] after gradually worsening SOB, worse in the last 7d. He reports he has had similar problerms in the past with Hb drops. At admission noted to have H/H: 8.2, 24.9, Na+:133, Troponin flat 0.03. PMH:  GIB, anemia, R MCA CVA (06/2014), CAD s/p CABG, MD, afib, and bladder CA(6ya). At baseliine he lives alone in a 2 story house, with in-home caregiver assistance for ADL/IADL 4hrs/day, 5x/week. Baseline mobility includes RW/rollator for limited community distances.   Clinical Impression  Upon entry, the patient is received seated at edge of bed, no family/caregiver present. The pt is awake and agreeable to participate. No acute distress noted at this time, pt reporting improved SOB at rest. SOB increases to 7/10 after 160' AMB, and HR increases to 108bpm. The pt is alert and oriented x3, pleasant, conversational, and following simple and multi-step commands consistently. Functional mobility assessment demonstrates mild-moderate weakness, the pt now requiring near maximal effort to perform bed mobility, transfers, and gait, whereas the patient performs these with modified independence at baseline. Pt reports increasing difficulty in coming to standing due to weakness in legs, noted today as multiple attempts are made to eventually come to standing.    Patient presenting with impairment of strength, range of motion, balance, and activity tolerance, limiting ability to perform ADL and mobility tasks at  baseline level of function. Patient will benefit from skilled intervention to address the above impairments and limitations, in order to restore to prior level of function, improve patient safety upon discharge, and to decrease falls risk.       Follow Up Recommendations Home health PT    Equipment  Recommendations  None recommended by PT    Recommendations for Other Services       Precautions / Restrictions Precautions Precautions: Fall      Mobility  Bed Mobility               General bed mobility comments: Received seated at EOB with tray  Transfers Overall transfer level: Needs assistance Equipment used: Rolling walker (2 wheeled) Transfers: Sit to/from Stand   Stand pivot transfers: Min guard       General transfer comment: weakness in legs noted, instability with standing.   Ambulation/Gait Ambulation/Gait assistance: Min guard Ambulation Distance (Feet): 180 Feet Assistive device: Rolling walker (2 wheeled) Gait Pattern/deviations: Trunk flexed Gait velocity: <0.68m/s Gait velocity interpretation: <1.8 ft/sec, indicative of risk for recurrent falls General Gait Details: Terminal DOE rated 7/10.   Stairs            Wheelchair Mobility    Modified Rankin (Stroke Patients Only)       Balance Overall balance assessment: No apparent balance deficits (not formally assessed)                                           Pertinent Vitals/Pain Pain Assessment: No/denies pain    Home Living Family/patient expects to be discharged to:: Private residence Living Arrangements: Alone Available Help at Discharge: Family;Friend(s) (has in home careg-gver services 20hrs per week. ) Type of Home: House Home Access: Stairs to enter Entrance Stairs-Rails: Psychiatric nurse of Steps: Garage: 2 with 2 railings Home Layout: Two level;Bed/bath upstairs Home Equipment: Gilford Rile -  2 wheels;Walker - 4 wheels Additional Comments: Pt has grab bars for toilet and shower. Pt has walk in shower and tub/shower combo. He owns shower chair.      Prior Function Level of Independence: Needs assistance   Gait / Transfers Assistance Needed: RW/Rollator for AMB  ADL's / Homemaking Assistance Needed: Min-ModAssist with ADL/IADL for  safety/set-up/supervision.         Hand Dominance   Dominant Hand: Right    Extremity/Trunk Assessment               Lower Extremity Assessment: Generalized weakness;Overall WFL for tasks assessed         Communication   Communication: No difficulties (excellent historian )  Cognition                            General Comments      Exercises     Assessment/Plan    PT Assessment Patient needs continued PT services  PT Problem List Decreased activity tolerance;Decreased balance;Decreased strength;Cardiopulmonary status limiting activity          PT Treatment Interventions Stair training;Functional mobility training;Therapeutic activities;Therapeutic exercise;Balance training;Patient/family education    PT Goals (Current goals can be found in the Care Plan section)  Acute Rehab PT Goals Patient Stated Goal: Improve funcitonal activity tolerance.  PT Goal Formulation: With patient Time For Goal Achievement: 04/06/16 Potential to Achieve Goals: Fair    Frequency Min 2X/week   Barriers to discharge Decreased caregiver support;Inaccessible home environment Bedroom is upstairs; lives alone.     Co-evaluation               End of Session Equipment Utilized During Treatment: Gait belt Activity Tolerance: Patient tolerated treatment well;Patient limited by fatigue Patient left: in chair;with call bell/phone within reach;Other (comment) (MD in room.) Nurse Communication: Mobility status         Time: [phone] PT Time Calculation (min) (ACUTE ONLY): 19 min   Charges:   PT Evaluation $PT Eval Moderate Complexity: 1 Procedure PT Treatments $Therapeutic Activity: 8-22 mins   10:00 AM, 03/23/16 Etta Grandchild, PT, DPT Physical Therapist - [place] [phone] (mobile)

## 2016-03-24 LAB — MAGNESIUM: MAGNESIUM: 1.9 mg/dL (ref 1.7–2.4)

## 2016-03-24 LAB — BASIC METABOLIC PANEL
Anion gap: 9 (ref 5–15)
BUN: 48 mg/dL — AB (ref 6–20)
CALCIUM: 8.8 mg/dL — AB (ref 8.9–10.3)
CHLORIDE: 99 mmol/L — AB (ref 101–111)
CO2: 26 mmol/L (ref 22–32)
CREATININE: 2.35 mg/dL — AB (ref 0.61–1.24)
GFR, EST AFRICAN AMERICAN: 27 mL/min — AB (ref >60)
GFR, EST NON AFRICAN AMERICAN: 23 mL/min — AB (ref >60)
Glucose, Bld: 184 mg/dL — ABNORMAL HIGH (ref 65–99)
Potassium: 3.7 mmol/L (ref 3.5–5.1)
SODIUM: 134 mmol/L — AB (ref 135–145)

## 2016-03-24 LAB — GLUCOSE, CAPILLARY
Glucose-Capillary: 281 mg/dL — ABNORMAL HIGH (ref 65–99)
Glucose-Capillary: 220 mg/dL — ABNORMAL HIGH (ref 65–99)

## 2016-03-24 LAB — HEMOGLOBIN: HEMOGLOBIN: 8.4 g/dL — AB (ref 13.0–18.0)

## 2016-03-24 MED ORDER — FUROSEMIDE 40 MG PO TABS
40.0000 mg | ORAL_TABLET | Freq: Every day | ORAL | Status: DC
  Administered 2016-03-24: 40 mg via ORAL
  Filled 2016-03-24: qty 1

## 2016-03-24 MED ORDER — ENOXAPARIN SODIUM 30 MG/0.3ML ~~LOC~~ SOLN: 30.0000 mg | SUBCUTANEOUS | Status: DC

## 2016-03-24 MED ORDER — MAGNESIUM SULFATE 2 GM/50ML IV SOLN
2.0000 g | Freq: Once | INTRAVENOUS | Status: AC
  Administered 2016-03-24: 2 g via INTRAVENOUS
  Filled 2016-03-24: qty 50

## 2016-03-24 MED ORDER — FUROSEMIDE 40 MG PO TABS: 40.0000 mg | ORAL_TABLET | Freq: Every day | ORAL | 0 refills | Status: DC

## 2016-03-24 NOTE — Care Management Note (Signed)
Case Management Note  Patient Details  [name] MRN: [id number] Date of Birth: Oct 31, 1927  Subjective/Objective:    Discussed discharge planning with Darden Amber, Mr Villasenor's son. The younger Mr Viscomi stated to continue with [facility] because they had used them in the past. Mr Moose's caregiver will transport him home today after lunch. A referral has been faxed to [facility] rewquesting HH-PT, RN, Aide. Note to HH-RN to draw BMP on Wednesday and fax results to Dr Rockey Situ.                Action/Plan:   Expected Discharge Date:                  Expected Discharge Plan:     In-House Referral:     Discharge planning Services     Post Acute Care Choice:    Choice offered to:     DME Arranged:    DME Agency:     HH Arranged:    HH Agency:     Status of Service:     If discussed at [employer] of Stay Meetings, dates discussed:    Additional Comments:  Wynona Duhamel A, RN 03/24/2016, 11:33 AM

## 2016-03-24 NOTE — Discharge Summary (Addendum)
[facility] [place] [name]    MR#:  [id number]  DATE OF BIRTH:  [birth year]  DATE OF ADMISSION:  03/22/2016 ADMITTING PHYSICIAN: Loletha Grayer, MD  DATE OF DISCHARGE: 03/24/2016  PRIMARY CARE PHYSICIAN: [name], MD    ADMISSION DIAGNOSIS:  SOB (shortness of breath) [R06.02] Acute on chronic diastolic congestive heart failure ([place]) [I50.33]  DISCHARGE DIAGNOSIS:  Active Problems:   CHF (congestive heart failure) ([place])   SECONDARY DIAGNOSIS:   Past Medical History:  Diagnosis Date  . Anemia   . Aortic stenosis, severe    a. s/p bioprosthetic aortic valve replacement in 2009  . BPH (benign prostatic hypertrophy)   . CAD (coronary artery disease)    a. s/p CABG x 1 in 2009  . Chronic diastolic (congestive) heart failure   . CVA (cerebral infarction) 06/2014   Right MCA infarct  . Diabetes mellitus   . Gastrointestinal bleed    a. reccurent GIB  . History of bladder cancer   . Hyperlipidemia   . Hypertension   . Junctional bradycardia   . Macular degeneration   . Mobitz type 1 second degree atrioventricular block 10/01/2012  . OA (osteoarthritis)   . Permanent atrial fibrillation ([place])    a. s/p Watchman device 08/10/2015; b. on [place]:   1. Acute diastolic congestive heart failure with bilateral pleural effusions.in the ER I gave 1 dose of Lasix 100 mg IV 1. Yesterday the patient received 40 mg IV twice a day. Patient's lungs are clear upon discharge. Cardiology recommends 40 mg of Lasix orally daily. If the patient gains 2-3 pounds in 1 day can take an extra 20 mg a day of Lasix. Daily weights.  CHF clinic referral.  Follow-up closely with Dr. Rockey Situ cardiology.  Likely will end up needing more Lasix because of his chronic kidney disease.  With CKD stage 4, I will not give ace inhibitor or aldactone. 2. Wheeze likely cardiac in nature. This has resolved. 3. Atrial fibrillation with  recent GI bleed. Aspirin only for anticoagulation. On Coreg for rate control. 4. Iron deficiency anemia. I gave 1 dose of 400 mg IV Venofer. Hemoglobin up to 8.4 upon discharge. No need for transfusion at this point. 5. History of CVA on aspirin 6. History of Aortic Stenosis status post valve surgery in the past. 7. History of prostate cancer 8. History of macular degeneration on PreserVision 9. Chronic kidney disease stage IV. Watch closely with diuresis. Home health to draw a BMP on Wednesday with results to go to Dr. Rockey Situ cardiology. 10. Type 2 diabetes mellitus. Continue Lantus and sliding scale 11. GERD on Protonix 12. Hyponatremia secondary to CHF. Sodium close to the normal range at 134  DISCHARGE CONDITIONS:   satisfactory  CONSULTS OBTAINED:  Treatment Team:  Minna Merritts, MD  DRUG ALLERGIES:   Allergies  Allergen Reactions  . Asa [Aspirin] Other (See Comments)    Reaction:  Caused stomach ulcer patient can take 81 mg.  . Losartan Other (See Comments)    Decreased pulse rate  . Metoprolol Other (See Comments)    Reaction:  Bradycardia   . Penicillins Swelling and Other (See Comments)    Has patient had a PCN reaction causing immediate rash, facial/tongue/throat swelling, SOB or lightheadedness with hypotension: No Has patient had a PCN reaction causing severe rash involving mucus membranes or skin necrosis: No Has patient had a PCN reaction that required hospitalization  No Has patient had a PCN reaction occurring within the last 10 years: No If all of the above answers are "NO", then may proceed with Cephalosporin use.    DISCHARGE MEDICATIONS:   Current Discharge Medication List    CONTINUE these medications which have CHANGED   Details  furosemide (LASIX) 40 MG tablet Take 1 tablet (40 mg total) by mouth daily. Qty: 30 tablet, Refills: 0      CONTINUE these medications which have NOT CHANGED   Details  acetaminophen (TYLENOL) 325 MG tablet Take 2  tablets (650 mg total) by mouth every 6 (six) hours as needed for mild pain (or Fever >/= 101).    amLODipine (NORVASC) 5 MG tablet TAKE 1 TABLET EVERY DAY Qty: 30 tablet, Refills: 7    aspirin EC 81 MG tablet Take 1 tablet (81 mg total) by mouth daily. Qty: 90 tablet, Refills: 3    calcitRIOL (ROCALTROL) 0.25 MCG capsule TAKE ONE CAPSULE THREE TIMES A WEEK (MONDAY, WEDNESDAY, AND FRIDAY) Qty: 15 capsule, Refills: 5    carvedilol (COREG) 6.25 MG tablet Take 1 tablet (6.25 mg total) by mouth 2 (two) times daily. Qty: 180 tablet, Refills: 0    cholecalciferol (VITAMIN D) 1000 units tablet Take 1,000 Units by mouth daily.    docusate sodium (COLACE) 100 MG capsule Take 100 mg by mouth daily.    escitalopram (LEXAPRO) 10 MG tablet TAKE 1 TABLET EVERY DAY WITH DINNER Qty: 30 tablet, Refills: 3    ferrous sulfate 325 (65 FE) MG tablet Take 325 mg by mouth daily with breakfast.    HYDROcodone-acetaminophen (NORCO/VICODIN) 5-325 MG tablet Take 1 tablet by mouth daily as needed for moderate pain. Qty: 30 tablet, Refills: 0    hydroxychloroquine (PLAQUENIL) 200 MG tablet Take 200 mg by mouth daily.    insulin aspart (NOVOLOG) 100 UNIT/ML injection Inject 3-7 Units into the skin 3 (three) times daily with meals as needed for high blood sugar. Pt uses as needed per sliding scale:    100-150:  3 units  151-200:  4 units  201-250:  5 units  Greater than 250:  7 units    insulin glargine (LANTUS) 100 UNIT/ML injection Inject 50 Units into the skin every morning.     isosorbide mononitrate (IMDUR) 30 MG 24 hr tablet Take 30 mg by mouth daily.    Multiple Vitamin (MULTIVITAMIN WITH MINERALS) TABS tablet Take 1 tablet by mouth daily.    Multiple Vitamins-Minerals (PRESERVISION AREDS 2) CAPS Take 1 capsule by mouth 2 (two) times daily.    pantoprazole (PROTONIX) 40 MG tablet Take 40 mg by mouth daily.    tamsulosin (FLOMAX) 0.4 MG CAPS capsule TAKE 1 CAPSULE BY MOUTH DAILY AFTER  SUPPER Qty: 30 capsule, Refills: 5    vitamin C (ASCORBIC ACID) 500 MG tablet Take 500 mg by mouth daily.         DISCHARGE INSTRUCTIONS:   Follow-up with Dr. Rockey Situ this week Follow-up with CHF clinic Follow-up PMD 2 weeks  If you experience worsening of your admission symptoms, develop shortness of breath, life threatening emergency, suicidal or homicidal thoughts you must seek medical attention immediately by calling 911 or calling your MD immediately  if symptoms less severe.  You Must read complete instructions/literature along with all the possible adverse reactions/side effects for all the Medicines you take and that have been prescribed to you. Take any new Medicines after you have completely understood and accept all the possible adverse reactions/side effects.  Please note  You were cared for by a hospitalist during your hospital stay. If you have any questions about your discharge medications or the care you received while you were in the hospital after you are discharged, you can call the unit and asked to speak with the hospitalist on call if the hospitalist that took care of you is not available. Once you are discharged, your primary care physician will handle any further medical issues. Please note that NO REFILLS for any discharge medications will be authorized once you are discharged, as it is imperative that you return to your primary care physician (or establish a relationship with a primary care physician if you do not have one) for your aftercare needs so that they can reassess your need for medications and monitor your lab values.    Today   CHIEF COMPLAINT:   Chief Complaint  Patient presents with  . Shortness of Breath    HISTORY OF PRESENT ILLNESS:  [name]  is a 82 y.o. male presented with shortness ve CHFand bilatera   VITAL SIGNS:  Blood pressure (!) 148/72, pulse 74, temperature 98.6 F (37 C), temperature source Oral, resp. rate 19, height  5\' 10"  (1.778 m), weight 106.4 kg (234 lb 9.6 oz), SpO2 94 %.   PHYSICAL EXAMINATION:  GENERAL:  82 y.o.-year-old patient lying in the bed with no acute distress.  EYES: Pupils equal, round, reactive to light and accommodation. No scleral icterus. Extraocular muscles intact.  HEENT: Head atraumatic, normocephalic. Oropharynx and nasopharynx clear.  NECK:  Supple, no jugular venous distention. No thyroid enlargement, no tenderness.  LUNGS: Normal breath sounds bilaterally, no wheezing, rales,rhonchi or crepitation. No use of accessory muscles of respiration.  CARDIOVASCULAR: S1, S2 normal. . A 6 systolic murmurs. No rubs, or gallops.  ABDOMEN: Soft, non-tender, non-distended. Bowel sounds present. No organomegaly or mass.  EXTREMITIES: 2+ edema, nocyanosis, or clubbing.  NEUROLOGIC: Cranial nerves II through XII are intact. Muscle strength 5/5 in all extremities. Sensation intact. Gait not checked.  PSYCHIATRIC: The patient is alert and oriented x 3.  SKIN: No obvious rash, lesion, or ulcer.   DATA REVIEW:   CBC  Recent Labs Lab 03/23/16 0517 03/24/16 0423  WBC 6.2  --   HGB 8.2* 8.4*  HCT 24.9*  --   PLT 184  --     Chemistries   Recent Labs Lab 03/22/16 1225  03/24/16 0423  NA 130*  < > 134*  K 4.8  < > 3.7  CL 99*  < > 99*  CO2 22  < > 26  GLUCOSE 255*  < > 184*  BUN 40*  < > 48*  CREATININE 2.14*  < > 2.35*  CALCIUM 8.8*  < > 8.8*  MG  --   --  1.9  AST 23  --   --   ALT 15*  --   --   ALKPHOS 113  --   --   BILITOT 0.3  --   --   < > = values in this interval not displayed.  Cardiac Enzymes  Recent Labs Lab 03/22/16 2112  TROPONINI 0.03*    Microbiology Results  Results for orders placed or performed during the hospital encounter of 08/10/15  MRSA PCR Screening     Status: None   Collection Time: 08/10/15 12:58 PM  Result Value Ref Range Status   MRSA by PCR NEGATIVE NEGATIVE Final    Comment:        The GeneXpert  MRSA Assay (FDA approved for  NASAL specimens only), is one component of a comprehensive MRSA colonization surveillance program. It is not intended to diagnose MRSA infection nor to guide or monitor treatment for MRSA infections.     RADIOLOGY:  Dg Chest 2 View  Result Date: 03/22/2016 CLINICAL DATA:  Shortness of Breath EXAM: CHEST  2 VIEW COMPARISON:  07/10/2015 FINDINGS: Prior median sternotomy and valve replacement. Mild cardiomegaly. No confluent opacities. Small bilateral effusions. No overt edema. IMPRESSION: Cardiomegaly.  Small bilateral effusions.  No overt failure. Electronically Signed   By: Rolm Baptise M.D.   On: 03/22/2016 12:57    Management plans discussed with the patient, family and they are in agreement.  CODE STATUS:     Code Status Orders        Start     Ordered   03/22/16 1548  Full code  Continuous     03/22/16 1548    Code Status History    Date Active Date Inactive Code Status Order ID Comments User Context   09/19/2015  7:27 PM 09/21/2015  5:47 PM Full Code [id number]  Lytle Butte, MD ED   07/28/2015  6:39 PM 07/31/2015  4:57 PM Full Code [id number]  [name] Costa, MD ED   07/10/2015 11:37 PM 07/12/2015 11:15 PM Full Code [id number]  Rise Patience, MD Inpatient   04/12/2015  7:05 PM 04/17/2015  7:10 PM Full Code [id number]  Geradine Girt, DO Inpatient   06/29/2014 12:24 PM 07/09/2014  2:15 PM Full Code [id number]  Bary Leriche, PA-C Inpatient    Advance Directive Documentation   Flowsheet Row Most Recent Value  Type of Advance Directive  Living will, Healthcare Power of Attorney  Pre-existing out of facility DNR order (yellow form or pink MOST form)  No data  "MOST" Form in Place?  No data      TOTAL TIME TAKING CARE OF THIS PATIENT: 35 minutes.    Loletha Grayer M.D on 03/24/2016 at 11:35 AM  Between 7am to 6pm - Pager - [phone]  After 6pm go to [email or web address] - password [email or web address]  [facility] Office  [phone]  CC: Primary care physician; [name], MD

## 2016-03-24 NOTE — Progress Notes (Signed)
    Subjective:  Denies CP or dyspnea   Objective:  Vitals:   03/23/16 1932 03/23/16 1950 03/24/16 0453 03/24/16 0802  BP: (!) 155/64  (!) 130/53   Pulse: 70  77   Resp: 16  18   Temp: 98.2 F (36.8 C)  98.4 F (36.9 C)   TempSrc: Oral  Oral   SpO2: 97% 96% 97% 97%  Weight:   234 lb 9.6 oz (106.4 kg)   Height:        Intake/Output from previous day:  Intake/Output Summary (Last 24 hours) at 03/24/16 1010 Last data filed at 03/24/16 0700  Gross per 24 hour  Intake              480 ml  Output             3050 ml  Net            -2570 ml    Physical Exam: Physical exam: Well-developed well-nourished in no acute distress.  Skin is warm and dry.  HEENT is normal.  Neck is supple. No thyromegaly.  Chest is clear to auscultation with normal expansion.  Cardiovascular exam is regular rate and rhythm.  Abdominal exam nontender or distended. No masses palpated. Extremities show no edema. neuro grossly intact    Lab Results: Basic Metabolic Panel:  Recent Labs  03/23/16 0517 03/24/16 0423  NA 133* 134*  K 4.5 3.7  CL 98* 99*  CO2 26 26  GLUCOSE 323* 184*  BUN 43* 48*  CREATININE 2.12* 2.35*  CALCIUM 8.7* 8.8*  MG  --  1.9   CBC:  Recent Labs  03/22/16 1225 03/23/16 0517 03/24/16 0423  WBC 6.4 6.2  --   NEUTROABS 5.0  --   --   HGB 8.1* 8.2* 8.4*  HCT 23.8* 24.9*  --   MCV 86.4 86.6  --   PLT 192 184  --    Cardiac Enzymes:  Recent Labs  03/22/16 1225 03/22/16 1755 03/22/16 2112  TROPONINI <0.03 <0.03 0.03*     Assessment/Plan:  1 acute on chronic diastolic congestive heart failure-patient presented with symptoms of congestive heart failure. I/O - 3130; wt 234. He is much improved with no dyspnea or orthopnea; DC on lasix 40 mg daily with BMET on Wed and FU with Dr Rockey Situ 2 weeks. We discussed the importance of low sodium diet and fluid restriction. Discussed daily weights with additional 20 mg lasix daily for weight gain of 2-3 lbs. We are  trying to balance keeping patient euvolemic without causing significantly worsening renal function.  2 history of aortic valve replacement-normal function on recent echocardiogram.  3 permanent atrial fibrillation-patient is not on anticoagulation given recurrent GI blood loss requiring transfusion. CHADSvasc 8. Watchman device in place. Continue carvedilol for rate control.  4 chronic stage III kidney disease-follow renal function closely as outpt.  5 chronic anemia-patient receives blood transfusions every 3-4 weeks. Hemoglobin at baseline. [facility]ed [facility].  6 hypertension-blood pressure controlled. Continue present medications.  Kirk Ruths 03/24/2016, 10:10 AM

## 2016-03-24 NOTE — Progress Notes (Signed)
Anticoagulation monitoring(Lovenox):  81 yo M ordered Lovenox 40 mg Q24h  Filed Weights   03/22/16 1216 03/23/16 0441 03/24/16 0453  Weight: 260 lb (117.9 kg) 238 lb 14.4 oz (108.4 kg) 234 lb 9.6 oz (106.4 kg)     Lab Results  Component Value Date   CREATININE 2.35 (H) 03/24/2016   CREATININE 2.12 (H) 03/23/2016   CREATININE 2.14 (H) 03/22/2016   Estimated Creatinine Clearance: 27.6 mL/min (by C-G formula based on SCr of 2.35 mg/dL (H)). Hemoglobin & Hematocrit     Component Value Date/Time   HGB 8.4 (L) 03/24/2016 0423   HGB 8.0 (L) 10/04/2014 1038   HCT 24.9 (L) 03/23/2016 0517   HCT 23.7 (L) 09/28/2014 1037     Per Protocol for Patient with estCrcl < 30 ml/min and BMI < 40, will transition to Lovenox 30 mg Q24h.     Ulice Dash, PharmD Clinical Pharmacist

## 2016-03-24 NOTE — Discharge Instructions (Addendum)
Take Lasix (furosemide) 40mg  orally daily, If you gain 2-3 lbs in a day then take an extra 20mg  of lasix.  Home health RN to draw BMP on Wednesday with results to [place]

## 2016-03-25 ENCOUNTER — Telehealth: Payer: Self-pay

## 2016-03-25 NOTE — Telephone Encounter (Signed)
Attempted to reach patient for TCM call, left a voicemail to return the call to me. thanks

## 2016-03-26 ENCOUNTER — Telehealth: Payer: Self-pay | Admitting: Internal Medicine

## 2016-03-26 NOTE — Telephone Encounter (Signed)
Attempted to call patient for transitional care management. No answer.  Left a message to call the office back. Lavella Lemons will follow as appropriate.

## 2016-03-26 NOTE — Telephone Encounter (Signed)
Can I use the 1130 or 430 this week? thanks

## 2016-03-26 NOTE — Telephone Encounter (Signed)
HFU, Pt was discharged from the hospital. Wife is not sure of the dx. No appt avail to sch. Let me know where to sch pt. Thank you!

## 2016-03-26 NOTE — Telephone Encounter (Signed)
You can,  But the hospitalization was for recurrent Hgb drop.   Nothing new.  So only if he feels he needs to be seen so soon.

## 2016-03-27 ENCOUNTER — Encounter: Payer: Self-pay | Admitting: Cardiovascular Disease

## 2016-03-27 ENCOUNTER — Telehealth: Payer: Self-pay | Admitting: Cardiovascular Disease

## 2016-03-27 DIAGNOSIS — M1990 Unspecified osteoarthritis, unspecified site: Secondary | ICD-10-CM | POA: Diagnosis not present

## 2016-03-27 DIAGNOSIS — N184 Chronic kidney disease, stage 4 (severe): Secondary | ICD-10-CM | POA: Diagnosis not present

## 2016-03-27 DIAGNOSIS — D631 Anemia in chronic kidney disease: Secondary | ICD-10-CM | POA: Diagnosis not present

## 2016-03-27 DIAGNOSIS — Z8546 Personal history of malignant neoplasm of prostate: Secondary | ICD-10-CM | POA: Diagnosis not present

## 2016-03-27 DIAGNOSIS — E1122 Type 2 diabetes mellitus with diabetic chronic kidney disease: Secondary | ICD-10-CM | POA: Diagnosis not present

## 2016-03-27 DIAGNOSIS — D509 Iron deficiency anemia, unspecified: Secondary | ICD-10-CM | POA: Diagnosis not present

## 2016-03-27 DIAGNOSIS — Z794 Long term (current) use of insulin: Secondary | ICD-10-CM | POA: Diagnosis not present

## 2016-03-27 DIAGNOSIS — I5033 Acute on chronic diastolic (congestive) heart failure: Secondary | ICD-10-CM | POA: Diagnosis not present

## 2016-03-27 DIAGNOSIS — H3530 Unspecified macular degeneration: Secondary | ICD-10-CM | POA: Diagnosis not present

## 2016-03-27 DIAGNOSIS — I482 Chronic atrial fibrillation: Secondary | ICD-10-CM | POA: Diagnosis not present

## 2016-03-27 DIAGNOSIS — I5032 Chronic diastolic (congestive) heart failure: Secondary | ICD-10-CM | POA: Diagnosis not present

## 2016-03-27 DIAGNOSIS — I130 Hypertensive heart and chronic kidney disease with heart failure and stage 1 through stage 4 chronic kidney disease, or unspecified chronic kidney disease: Secondary | ICD-10-CM | POA: Diagnosis not present

## 2016-03-27 DIAGNOSIS — Z951 Presence of aortocoronary bypass graft: Secondary | ICD-10-CM | POA: Diagnosis not present

## 2016-03-27 DIAGNOSIS — Z8673 Personal history of transient ischemic attack (TIA), and cerebral infarction without residual deficits: Secondary | ICD-10-CM | POA: Diagnosis not present

## 2016-03-27 DIAGNOSIS — Z87891 Personal history of nicotine dependence: Secondary | ICD-10-CM | POA: Diagnosis not present

## 2016-03-27 DIAGNOSIS — I2510 Atherosclerotic heart disease of native coronary artery without angina pectoris: Secondary | ICD-10-CM | POA: Diagnosis not present

## 2016-03-27 DIAGNOSIS — K922 Gastrointestinal hemorrhage, unspecified: Secondary | ICD-10-CM | POA: Diagnosis not present

## 2016-03-27 DIAGNOSIS — Z952 Presence of prosthetic heart valve: Secondary | ICD-10-CM | POA: Diagnosis not present

## 2016-03-27 DIAGNOSIS — I481 Persistent atrial fibrillation: Secondary | ICD-10-CM | POA: Diagnosis not present

## 2016-03-27 DIAGNOSIS — Z8551 Personal history of malignant neoplasm of bladder: Secondary | ICD-10-CM | POA: Diagnosis not present

## 2016-03-27 DIAGNOSIS — N183 Chronic kidney disease, stage 3 (moderate): Secondary | ICD-10-CM | POA: Diagnosis not present

## 2016-03-27 NOTE — Telephone Encounter (Signed)
Attempted to reach patient, left a VM to call the office, thanks

## 2016-03-27 NOTE — Telephone Encounter (Signed)
[name] with [facility] calling stating that the paper work from hospital is instructing her to draw labs and report back to [place] Not sure what labs she states  She is going to go out and see him today Please advise.

## 2016-03-27 NOTE — Telephone Encounter (Signed)
Amy from [facility] called to see what labs needed to be done on the patient. Reviewed previous notes and per case management it was to be a BMP. She verbalized understanding and had no further questions at this time.

## 2016-03-28 ENCOUNTER — Encounter: Payer: Self-pay | Admitting: Cardiovascular Disease

## 2016-03-28 ENCOUNTER — Ambulatory Visit (INDEPENDENT_AMBULATORY_CARE_PROVIDER_SITE_OTHER): Payer: Medicare Other | Admitting: Cardiovascular Disease

## 2016-03-28 ENCOUNTER — Inpatient Hospital Stay: Payer: Medicare Other

## 2016-03-28 VITALS — BP 140/54 | HR 69 | Ht 70.0 in | Wt 245.0 lb

## 2016-03-28 DIAGNOSIS — N183 Chronic kidney disease, stage 3 unspecified: Secondary | ICD-10-CM

## 2016-03-28 DIAGNOSIS — I2510 Atherosclerotic heart disease of native coronary artery without angina pectoris: Secondary | ICD-10-CM

## 2016-03-28 DIAGNOSIS — I481 Persistent atrial fibrillation: Secondary | ICD-10-CM | POA: Diagnosis not present

## 2016-03-28 DIAGNOSIS — K922 Gastrointestinal hemorrhage, unspecified: Secondary | ICD-10-CM

## 2016-03-28 DIAGNOSIS — I5033 Acute on chronic diastolic (congestive) heart failure: Secondary | ICD-10-CM | POA: Diagnosis not present

## 2016-03-28 DIAGNOSIS — I4819 Other persistent atrial fibrillation: Secondary | ICD-10-CM

## 2016-03-28 DIAGNOSIS — D631 Anemia in chronic kidney disease: Secondary | ICD-10-CM

## 2016-03-28 NOTE — Telephone Encounter (Signed)
This is the third attempt to reach him for a TCM call, left a message. thanks
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
## 2016-03-28 NOTE — Patient Instructions (Signed)
Medication Instructions:   Please stay on lasix 40 mg daily Take extra lasix after lunch for weight gain of three pounds  Labwork:  No new labs needed  Testing/Procedures:  No further testing at this time   Follow-Up: It was a pleasure seeing you in the office today. Please call us if you have new issues that need to be addressed before your next appt.  [phone]  Your physician wants you to follow-up in: 2 months.  You will receive a reminder letter in the mail two months in advance. If you don't receive a letter, please call our office to schedule the follow-up appointment.  If you need a refill on your cardiac medications before your next appointment, please call your pharmacy.

## 2016-03-29 ENCOUNTER — Telehealth: Payer: Self-pay | Admitting: Internal Medicine

## 2016-03-29 MED ORDER — HYDROCODONE-ACETAMINOPHEN 5-325 MG PO TABS
1.0000 | ORAL_TABLET | Freq: Every day | ORAL | 0 refills | Status: DC | PRN
Start: 2016-03-29 — End: 2016-05-08

## 2016-03-29 NOTE — Telephone Encounter (Signed)
Patient notified and placed at front desk 

## 2016-03-29 NOTE — Telephone Encounter (Signed)
Manuela Schwartz, Mr. Mikita's caregiver called and stated that Dr. Rockey Situ was to get in touch with Dr. Derrel Nip regarding an rx of HYDROcodone-acetaminophen (NORCO/VICODIN) 5-325 MG tablet and needing a new script. Please advise, thank you!  [place], [place] - [place]  Call pt @ - [phone]

## 2016-03-29 NOTE — Telephone Encounter (Signed)
Last fill 12/17/15 has OV next week.

## 2016-03-29 NOTE — Telephone Encounter (Signed)
Will refill,  But it has to picked up /

## 2016-04-01 DIAGNOSIS — D509 Iron deficiency anemia, unspecified: Secondary | ICD-10-CM | POA: Diagnosis not present

## 2016-04-01 DIAGNOSIS — E1122 Type 2 diabetes mellitus with diabetic chronic kidney disease: Secondary | ICD-10-CM | POA: Diagnosis not present

## 2016-04-01 DIAGNOSIS — I130 Hypertensive heart and chronic kidney disease with heart failure and stage 1 through stage 4 chronic kidney disease, or unspecified chronic kidney disease: Secondary | ICD-10-CM | POA: Diagnosis not present

## 2016-04-01 DIAGNOSIS — I482 Chronic atrial fibrillation: Secondary | ICD-10-CM | POA: Diagnosis not present

## 2016-04-01 DIAGNOSIS — I5032 Chronic diastolic (congestive) heart failure: Secondary | ICD-10-CM | POA: Diagnosis not present

## 2016-04-01 DIAGNOSIS — N184 Chronic kidney disease, stage 4 (severe): Secondary | ICD-10-CM | POA: Diagnosis not present

## 2016-04-02 ENCOUNTER — Other Ambulatory Visit: Payer: Self-pay | Admitting: *Deleted

## 2016-04-02 ENCOUNTER — Ambulatory Visit: Payer: Medicare Other | Admitting: Hematology and Oncology

## 2016-04-02 DIAGNOSIS — D508 Other iron deficiency anemias: Secondary | ICD-10-CM

## 2016-04-03 ENCOUNTER — Telehealth: Payer: Self-pay | Admitting: Internal Medicine

## 2016-04-03 ENCOUNTER — Other Ambulatory Visit: Payer: Self-pay | Admitting: Internal Medicine

## 2016-04-03 DIAGNOSIS — E875 Hyperkalemia: Secondary | ICD-10-CM

## 2016-04-03 NOTE — Telephone Encounter (Signed)
His potassium was elevated at 5.5 and his and cr was more elevated than usual.  Needs repeat BMET drawn , is [place] [facility] still d going out?

## 2016-04-03 NOTE — Telephone Encounter (Signed)
Patient is going to the Cancer center 04/04/16 to have a procrit injection suggested by Dr. Rockey Situ, Can he get the blood work there?

## 2016-04-03 NOTE — Telephone Encounter (Signed)
YES,  BUT I HAVE NO WAY OF KNOWING IF THEY CAN SEE MY ORDER/

## 2016-04-04 ENCOUNTER — Encounter: Payer: Self-pay | Admitting: Hematology and Oncology

## 2016-04-04 ENCOUNTER — Inpatient Hospital Stay: Payer: Medicare Other | Attending: Hematology and Oncology | Admitting: Hematology and Oncology

## 2016-04-04 ENCOUNTER — Other Ambulatory Visit: Payer: Self-pay | Admitting: Hematology and Oncology

## 2016-04-04 ENCOUNTER — Telehealth: Payer: Self-pay | Admitting: Internal Medicine

## 2016-04-04 ENCOUNTER — Other Ambulatory Visit: Payer: Self-pay | Admitting: Internal Medicine

## 2016-04-04 ENCOUNTER — Telehealth: Payer: Self-pay | Admitting: *Deleted

## 2016-04-04 ENCOUNTER — Other Ambulatory Visit: Payer: Self-pay | Admitting: *Deleted

## 2016-04-04 ENCOUNTER — Inpatient Hospital Stay: Payer: Medicare Other

## 2016-04-04 VITALS — BP 138/65 | HR 63 | Temp 97.1°F | Resp 18 | Wt 242.3 lb

## 2016-04-04 DIAGNOSIS — D631 Anemia in chronic kidney disease: Secondary | ICD-10-CM | POA: Diagnosis not present

## 2016-04-04 DIAGNOSIS — K922 Gastrointestinal hemorrhage, unspecified: Secondary | ICD-10-CM | POA: Insufficient documentation

## 2016-04-04 DIAGNOSIS — I2510 Atherosclerotic heart disease of native coronary artery without angina pectoris: Secondary | ICD-10-CM | POA: Diagnosis not present

## 2016-04-04 DIAGNOSIS — Z7982 Long term (current) use of aspirin: Secondary | ICD-10-CM

## 2016-04-04 DIAGNOSIS — Z8673 Personal history of transient ischemic attack (TIA), and cerebral infarction without residual deficits: Secondary | ICD-10-CM | POA: Diagnosis not present

## 2016-04-04 DIAGNOSIS — D649 Anemia, unspecified: Secondary | ICD-10-CM

## 2016-04-04 DIAGNOSIS — R5383 Other fatigue: Secondary | ICD-10-CM

## 2016-04-04 DIAGNOSIS — D508 Other iron deficiency anemias: Secondary | ICD-10-CM

## 2016-04-04 DIAGNOSIS — I5032 Chronic diastolic (congestive) heart failure: Secondary | ICD-10-CM | POA: Insufficient documentation

## 2016-04-04 DIAGNOSIS — E785 Hyperlipidemia, unspecified: Secondary | ICD-10-CM | POA: Insufficient documentation

## 2016-04-04 DIAGNOSIS — Z794 Long term (current) use of insulin: Secondary | ICD-10-CM | POA: Diagnosis not present

## 2016-04-04 DIAGNOSIS — R0602 Shortness of breath: Secondary | ICD-10-CM | POA: Insufficient documentation

## 2016-04-04 DIAGNOSIS — D500 Iron deficiency anemia secondary to blood loss (chronic): Secondary | ICD-10-CM | POA: Diagnosis not present

## 2016-04-04 DIAGNOSIS — I482 Chronic atrial fibrillation: Secondary | ICD-10-CM | POA: Insufficient documentation

## 2016-04-04 DIAGNOSIS — I129 Hypertensive chronic kidney disease with stage 1 through stage 4 chronic kidney disease, or unspecified chronic kidney disease: Secondary | ICD-10-CM | POA: Diagnosis not present

## 2016-04-04 DIAGNOSIS — Z87891 Personal history of nicotine dependence: Secondary | ICD-10-CM | POA: Insufficient documentation

## 2016-04-04 DIAGNOSIS — N400 Enlarged prostate without lower urinary tract symptoms: Secondary | ICD-10-CM | POA: Diagnosis not present

## 2016-04-04 DIAGNOSIS — N184 Chronic kidney disease, stage 4 (severe): Secondary | ICD-10-CM | POA: Diagnosis not present

## 2016-04-04 DIAGNOSIS — Z79899 Other long term (current) drug therapy: Secondary | ICD-10-CM

## 2016-04-04 DIAGNOSIS — Z8551 Personal history of malignant neoplasm of bladder: Secondary | ICD-10-CM

## 2016-04-04 DIAGNOSIS — Z8042 Family history of malignant neoplasm of prostate: Secondary | ICD-10-CM | POA: Diagnosis not present

## 2016-04-04 DIAGNOSIS — E1122 Type 2 diabetes mellitus with diabetic chronic kidney disease: Secondary | ICD-10-CM | POA: Insufficient documentation

## 2016-04-04 LAB — FOLATE: Folate: 55.8 ng/mL (ref >5.9)

## 2016-04-04 LAB — IRON AND TIBC
Iron: 39 ug/dL — ABNORMAL LOW (ref 45–182)
Saturation Ratios: 13 % — ABNORMAL LOW (ref 17.9–39.5)
TIBC: 290 ug/dL (ref 250–450)
UIBC: 251 ug/dL

## 2016-04-04 LAB — COMPREHENSIVE METABOLIC PANEL
ALT: 14 U/L — ABNORMAL LOW (ref 17–63)
AST: 22 U/L (ref 15–41)
Albumin: 3.5 g/dL (ref 3.5–5.0)
Alkaline Phosphatase: 101 U/L (ref 38–126)
Anion gap: 9 (ref 5–15)
BUN: 39 mg/dL — ABNORMAL HIGH (ref 6–20)
CO2: 23 mmol/L (ref 22–32)
Calcium: 8.6 mg/dL — ABNORMAL LOW (ref 8.9–10.3)
Chloride: 98 mmol/L — ABNORMAL LOW (ref 101–111)
Creatinine, Ser: 2.16 mg/dL — ABNORMAL HIGH (ref 0.61–1.24)
GFR calc Af Amer: 30 mL/min — ABNORMAL LOW (ref >60)
GFR calc non Af Amer: 26 mL/min — ABNORMAL LOW (ref >60)
Glucose, Bld: 73 mg/dL (ref 65–99)
Potassium: 4.7 mmol/L (ref 3.5–5.1)
Sodium: 130 mmol/L — ABNORMAL LOW (ref 135–145)
Total Bilirubin: 0.5 mg/dL (ref 0.3–1.2)
Total Protein: 6.2 g/dL — ABNORMAL LOW (ref 6.5–8.1)

## 2016-04-04 LAB — CBC WITH DIFFERENTIAL/PLATELET
Basophils Absolute: 0.1 10*3/uL (ref 0–0.1)
Basophils Relative: 1 %
Eosinophils Absolute: 0.3 10*3/uL (ref 0–0.7)
Eosinophils Relative: 7 %
HCT: 21.1 % — ABNORMAL LOW (ref 40.0–52.0)
Hemoglobin: 7.1 g/dL — ABNORMAL LOW (ref 13.0–18.0)
Lymphocytes Relative: 11 %
Lymphs Abs: 0.6 10*3/uL — ABNORMAL LOW (ref 1.0–3.6)
MCH: 28.5 pg (ref 26.0–34.0)
MCHC: 33.4 g/dL (ref 32.0–36.0)
MCV: 85.5 fL (ref 80.0–100.0)
Monocytes Absolute: 0.7 10*3/uL (ref 0.2–1.0)
Monocytes Relative: 13 %
Neutro Abs: 3.5 10*3/uL (ref 1.4–6.5)
Neutrophils Relative %: 68 %
Platelets: 223 10*3/uL (ref 150–440)
RBC: 2.47 MIL/uL — ABNORMAL LOW (ref 4.40–5.90)
RDW: 16 % — ABNORMAL HIGH (ref 11.5–14.5)
WBC: 5.1 10*3/uL (ref 3.8–10.6)

## 2016-04-04 LAB — SAMPLE TO BLOOD BANK

## 2016-04-04 LAB — PREPARE RBC (CROSSMATCH)

## 2016-04-04 LAB — VITAMIN B12: Vitamin B-12: 598 pg/mL (ref 180–914)

## 2016-04-04 LAB — FERRITIN: Ferritin: 128 ng/mL (ref 24–336)

## 2016-04-04 NOTE — Telephone Encounter (Signed)
Please advise for appt time and date.

## 2016-04-04 NOTE — Telephone Encounter (Signed)
Faxed copy of lab orders to Cancer center.

## 2016-04-04 NOTE — Telephone Encounter (Signed)
FYI Pt will have a Met C drawn today at  [facility], the results for the MET B should be resulted later today

## 2016-04-04 NOTE — Telephone Encounter (Deleted)
Refill request originally filled by Dr. Olevia Bowens last filled 01/30/16  Nov sched Last office visit 03/26/2016

## 2016-04-04 NOTE — Telephone Encounter (Signed)
Error dupliATE

## 2016-04-04 NOTE — Telephone Encounter (Signed)
Pt had to cancel his hospital f/u on 10/27 because he is having a transfusion done tomorrow and will take all day. Please advise as to where to reschedule this patient. Thank you!

## 2016-04-04 NOTE — Telephone Encounter (Signed)
Since he is getting transfused tomorrow, I should see him next Friday. 11:30 or 4:30
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
## 2016-04-05 ENCOUNTER — Ambulatory Visit: Payer: Medicare Other | Admitting: Internal Medicine

## 2016-04-05 ENCOUNTER — Telehealth: Payer: Self-pay | Admitting: *Deleted

## 2016-04-05 ENCOUNTER — Inpatient Hospital Stay: Payer: Medicare Other

## 2016-04-05 DIAGNOSIS — K922 Gastrointestinal hemorrhage, unspecified: Secondary | ICD-10-CM | POA: Diagnosis not present

## 2016-04-05 DIAGNOSIS — D631 Anemia in chronic kidney disease: Secondary | ICD-10-CM | POA: Diagnosis not present

## 2016-04-05 DIAGNOSIS — Z79899 Other long term (current) drug therapy: Secondary | ICD-10-CM | POA: Diagnosis not present

## 2016-04-05 DIAGNOSIS — N184 Chronic kidney disease, stage 4 (severe): Principal | ICD-10-CM

## 2016-04-05 DIAGNOSIS — D500 Iron deficiency anemia secondary to blood loss (chronic): Secondary | ICD-10-CM | POA: Diagnosis not present

## 2016-04-05 DIAGNOSIS — D649 Anemia, unspecified: Secondary | ICD-10-CM

## 2016-04-05 DIAGNOSIS — I129 Hypertensive chronic kidney disease with stage 1 through stage 4 chronic kidney disease, or unspecified chronic kidney disease: Secondary | ICD-10-CM | POA: Diagnosis not present

## 2016-04-05 MED ORDER — SODIUM CHLORIDE 0.9 % IV SOLN
250.0000 mL | Freq: Once | INTRAVENOUS | Status: AC
  Administered 2016-04-05: 250 mL via INTRAVENOUS
  Filled 2016-04-05: qty 250

## 2016-04-05 MED ORDER — ACETAMINOPHEN 325 MG PO TABS
650.0000 mg | ORAL_TABLET | Freq: Once | ORAL | Status: AC
  Administered 2016-04-05: 650 mg via ORAL
  Filled 2016-04-05: qty 2

## 2016-04-05 MED ORDER — PANTOPRAZOLE SODIUM 40 MG PO TBEC: 40.0000 mg | DELAYED_RELEASE_TABLET | Freq: Every day | ORAL | 3 refills | Status: AC

## 2016-04-05 MED ORDER — DIPHENHYDRAMINE HCL 25 MG PO CAPS
25.0000 mg | ORAL_CAPSULE | Freq: Once | ORAL | Status: AC
  Administered 2016-04-05: 25 mg via ORAL
  Filled 2016-04-05: qty 1

## 2016-04-05 NOTE — Telephone Encounter (Signed)
Protonix refilled.  kidney function is low but stable.

## 2016-04-05 NOTE — Telephone Encounter (Signed)
[name] has requested a medication refill for pantoprazole, this Rx has not been received by pharmacy

## 2016-04-05 NOTE — Telephone Encounter (Signed)
Duplicate request

## 2016-04-05 NOTE — Telephone Encounter (Signed)
Scheduled for next Friday at 430pm. thanks

## 2016-04-05 NOTE — Telephone Encounter (Signed)
Please advise. Due to decreased kidney function. Per lab results.

## 2016-04-06 LAB — TYPE AND SCREEN
ABO/RH(D): O POS
Antibody Screen: NEGATIVE
Unit division: 0
Unit division: 0
Unit division: 0
Unit division: 0

## 2016-04-08 DIAGNOSIS — N184 Chronic kidney disease, stage 4 (severe): Secondary | ICD-10-CM | POA: Diagnosis not present

## 2016-04-08 DIAGNOSIS — I482 Chronic atrial fibrillation: Secondary | ICD-10-CM | POA: Diagnosis not present

## 2016-04-08 DIAGNOSIS — E1122 Type 2 diabetes mellitus with diabetic chronic kidney disease: Secondary | ICD-10-CM | POA: Diagnosis not present

## 2016-04-08 DIAGNOSIS — D509 Iron deficiency anemia, unspecified: Secondary | ICD-10-CM | POA: Diagnosis not present

## 2016-04-08 DIAGNOSIS — I5032 Chronic diastolic (congestive) heart failure: Secondary | ICD-10-CM | POA: Diagnosis not present

## 2016-04-08 DIAGNOSIS — I130 Hypertensive heart and chronic kidney disease with heart failure and stage 1 through stage 4 chronic kidney disease, or unspecified chronic kidney disease: Secondary | ICD-10-CM | POA: Diagnosis not present

## 2016-04-10 DIAGNOSIS — I482 Chronic atrial fibrillation: Secondary | ICD-10-CM | POA: Diagnosis not present

## 2016-04-10 DIAGNOSIS — D509 Iron deficiency anemia, unspecified: Secondary | ICD-10-CM | POA: Diagnosis not present

## 2016-04-10 DIAGNOSIS — E1122 Type 2 diabetes mellitus with diabetic chronic kidney disease: Secondary | ICD-10-CM | POA: Diagnosis not present

## 2016-04-10 DIAGNOSIS — I130 Hypertensive heart and chronic kidney disease with heart failure and stage 1 through stage 4 chronic kidney disease, or unspecified chronic kidney disease: Secondary | ICD-10-CM | POA: Diagnosis not present

## 2016-04-10 DIAGNOSIS — N184 Chronic kidney disease, stage 4 (severe): Secondary | ICD-10-CM | POA: Diagnosis not present

## 2016-04-10 DIAGNOSIS — I5032 Chronic diastolic (congestive) heart failure: Secondary | ICD-10-CM | POA: Diagnosis not present

## 2016-04-11 DIAGNOSIS — I482 Chronic atrial fibrillation: Secondary | ICD-10-CM | POA: Diagnosis not present

## 2016-04-11 DIAGNOSIS — I5032 Chronic diastolic (congestive) heart failure: Secondary | ICD-10-CM | POA: Diagnosis not present

## 2016-04-11 DIAGNOSIS — E1122 Type 2 diabetes mellitus with diabetic chronic kidney disease: Secondary | ICD-10-CM | POA: Diagnosis not present

## 2016-04-11 DIAGNOSIS — D509 Iron deficiency anemia, unspecified: Secondary | ICD-10-CM | POA: Diagnosis not present

## 2016-04-11 DIAGNOSIS — N184 Chronic kidney disease, stage 4 (severe): Secondary | ICD-10-CM | POA: Diagnosis not present

## 2016-04-11 DIAGNOSIS — I130 Hypertensive heart and chronic kidney disease with heart failure and stage 1 through stage 4 chronic kidney disease, or unspecified chronic kidney disease: Secondary | ICD-10-CM | POA: Diagnosis not present

## 2016-04-12 ENCOUNTER — Ambulatory Visit: Payer: Medicare Other | Admitting: Internal Medicine

## 2016-04-15 ENCOUNTER — Inpatient Hospital Stay: Payer: Medicare Other | Attending: Hematology and Oncology

## 2016-04-15 ENCOUNTER — Inpatient Hospital Stay: Payer: Medicare Other

## 2016-04-15 VITALS — BP 156/61 | HR 62

## 2016-04-15 DIAGNOSIS — I350 Nonrheumatic aortic (valve) stenosis: Secondary | ICD-10-CM | POA: Diagnosis not present

## 2016-04-15 DIAGNOSIS — I482 Chronic atrial fibrillation: Secondary | ICD-10-CM | POA: Diagnosis not present

## 2016-04-15 DIAGNOSIS — Z87891 Personal history of nicotine dependence: Secondary | ICD-10-CM | POA: Diagnosis not present

## 2016-04-15 DIAGNOSIS — Z79899 Other long term (current) drug therapy: Secondary | ICD-10-CM | POA: Diagnosis not present

## 2016-04-15 DIAGNOSIS — R001 Bradycardia, unspecified: Secondary | ICD-10-CM | POA: Insufficient documentation

## 2016-04-15 DIAGNOSIS — I129 Hypertensive chronic kidney disease with stage 1 through stage 4 chronic kidney disease, or unspecified chronic kidney disease: Secondary | ICD-10-CM | POA: Diagnosis not present

## 2016-04-15 DIAGNOSIS — Z8042 Family history of malignant neoplasm of prostate: Secondary | ICD-10-CM | POA: Insufficient documentation

## 2016-04-15 DIAGNOSIS — D631 Anemia in chronic kidney disease: Secondary | ICD-10-CM

## 2016-04-15 DIAGNOSIS — D500 Iron deficiency anemia secondary to blood loss (chronic): Secondary | ICD-10-CM | POA: Diagnosis not present

## 2016-04-15 DIAGNOSIS — Z8551 Personal history of malignant neoplasm of bladder: Secondary | ICD-10-CM | POA: Insufficient documentation

## 2016-04-15 DIAGNOSIS — R531 Weakness: Secondary | ICD-10-CM | POA: Insufficient documentation

## 2016-04-15 DIAGNOSIS — Z8673 Personal history of transient ischemic attack (TIA), and cerebral infarction without residual deficits: Secondary | ICD-10-CM | POA: Diagnosis not present

## 2016-04-15 DIAGNOSIS — R0602 Shortness of breath: Secondary | ICD-10-CM | POA: Insufficient documentation

## 2016-04-15 DIAGNOSIS — K921 Melena: Secondary | ICD-10-CM | POA: Insufficient documentation

## 2016-04-15 DIAGNOSIS — Z7982 Long term (current) use of aspirin: Secondary | ICD-10-CM | POA: Insufficient documentation

## 2016-04-15 DIAGNOSIS — N189 Chronic kidney disease, unspecified: Secondary | ICD-10-CM | POA: Insufficient documentation

## 2016-04-15 DIAGNOSIS — M1990 Unspecified osteoarthritis, unspecified site: Secondary | ICD-10-CM | POA: Diagnosis not present

## 2016-04-15 DIAGNOSIS — Z7901 Long term (current) use of anticoagulants: Secondary | ICD-10-CM | POA: Insufficient documentation

## 2016-04-15 DIAGNOSIS — N184 Chronic kidney disease, stage 4 (severe): Secondary | ICD-10-CM

## 2016-04-15 DIAGNOSIS — N400 Enlarged prostate without lower urinary tract symptoms: Secondary | ICD-10-CM | POA: Diagnosis not present

## 2016-04-15 DIAGNOSIS — E119 Type 2 diabetes mellitus without complications: Secondary | ICD-10-CM | POA: Insufficient documentation

## 2016-04-15 DIAGNOSIS — Z794 Long term (current) use of insulin: Secondary | ICD-10-CM | POA: Insufficient documentation

## 2016-04-15 DIAGNOSIS — E785 Hyperlipidemia, unspecified: Secondary | ICD-10-CM | POA: Diagnosis not present

## 2016-04-15 DIAGNOSIS — I2510 Atherosclerotic heart disease of native coronary artery without angina pectoris: Secondary | ICD-10-CM | POA: Insufficient documentation

## 2016-04-15 DIAGNOSIS — I5032 Chronic diastolic (congestive) heart failure: Secondary | ICD-10-CM | POA: Diagnosis not present

## 2016-04-15 DIAGNOSIS — R5383 Other fatigue: Secondary | ICD-10-CM | POA: Insufficient documentation

## 2016-04-15 LAB — SAMPLE TO BLOOD BANK

## 2016-04-15 LAB — CBC WITH DIFFERENTIAL/PLATELET
Basophils Absolute: 0 10*3/uL (ref 0–0.1)
Basophils Relative: 1 %
Eosinophils Absolute: 0.3 10*3/uL (ref 0–0.7)
Eosinophils Relative: 6 %
HCT: 25.2 % — ABNORMAL LOW (ref 40.0–52.0)
Hemoglobin: 8.3 g/dL — ABNORMAL LOW (ref 13.0–18.0)
Lymphocytes Relative: 7 %
Lymphs Abs: 0.4 10*3/uL — ABNORMAL LOW (ref 1.0–3.6)
MCH: 28.5 pg (ref 26.0–34.0)
MCHC: 32.7 g/dL (ref 32.0–36.0)
MCV: 87.2 fL (ref 80.0–100.0)
Monocytes Absolute: 0.6 10*3/uL (ref 0.2–1.0)
Monocytes Relative: 11 %
Neutro Abs: 4.4 10*3/uL (ref 1.4–6.5)
Neutrophils Relative %: 75 %
Platelets: 178 10*3/uL (ref 150–440)
RBC: 2.89 MIL/uL — ABNORMAL LOW (ref 4.40–5.90)
RDW: 16.6 % — ABNORMAL HIGH (ref 11.5–14.5)
WBC: 5.8 10*3/uL (ref 3.8–10.6)

## 2016-04-15 MED ORDER — EPOETIN ALFA 10000 UNIT/ML IJ SOLN
10000.0000 [IU] | Freq: Once | INTRAMUSCULAR | Status: AC
  Administered 2016-04-15: 10000 [IU] via SUBCUTANEOUS
  Filled 2016-04-15: qty 2

## 2016-04-16 DIAGNOSIS — I482 Chronic atrial fibrillation: Secondary | ICD-10-CM | POA: Diagnosis not present

## 2016-04-16 DIAGNOSIS — E1122 Type 2 diabetes mellitus with diabetic chronic kidney disease: Secondary | ICD-10-CM | POA: Diagnosis not present

## 2016-04-16 DIAGNOSIS — I5032 Chronic diastolic (congestive) heart failure: Secondary | ICD-10-CM | POA: Diagnosis not present

## 2016-04-16 DIAGNOSIS — I130 Hypertensive heart and chronic kidney disease with heart failure and stage 1 through stage 4 chronic kidney disease, or unspecified chronic kidney disease: Secondary | ICD-10-CM | POA: Diagnosis not present

## 2016-04-16 DIAGNOSIS — N184 Chronic kidney disease, stage 4 (severe): Secondary | ICD-10-CM | POA: Diagnosis not present

## 2016-04-16 DIAGNOSIS — D509 Iron deficiency anemia, unspecified: Secondary | ICD-10-CM | POA: Diagnosis not present

## 2016-04-17 ENCOUNTER — Ambulatory Visit: Payer: Medicare Other | Attending: Family | Admitting: Family

## 2016-04-17 VITALS — BP 141/48 | HR 57 | Resp 20 | Ht 70.0 in | Wt 251.0 lb

## 2016-04-17 DIAGNOSIS — I5032 Chronic diastolic (congestive) heart failure: Secondary | ICD-10-CM

## 2016-04-17 DIAGNOSIS — Z79899 Other long term (current) drug therapy: Secondary | ICD-10-CM | POA: Insufficient documentation

## 2016-04-17 DIAGNOSIS — N184 Chronic kidney disease, stage 4 (severe): Secondary | ICD-10-CM

## 2016-04-17 DIAGNOSIS — R001 Bradycardia, unspecified: Secondary | ICD-10-CM | POA: Insufficient documentation

## 2016-04-17 DIAGNOSIS — D649 Anemia, unspecified: Secondary | ICD-10-CM | POA: Diagnosis not present

## 2016-04-17 DIAGNOSIS — E1122 Type 2 diabetes mellitus with diabetic chronic kidney disease: Secondary | ICD-10-CM | POA: Insufficient documentation

## 2016-04-17 DIAGNOSIS — N189 Chronic kidney disease, unspecified: Secondary | ICD-10-CM | POA: Diagnosis not present

## 2016-04-17 DIAGNOSIS — I2510 Atherosclerotic heart disease of native coronary artery without angina pectoris: Secondary | ICD-10-CM | POA: Diagnosis not present

## 2016-04-17 DIAGNOSIS — Z7982 Long term (current) use of aspirin: Secondary | ICD-10-CM | POA: Insufficient documentation

## 2016-04-17 DIAGNOSIS — D631 Anemia in chronic kidney disease: Secondary | ICD-10-CM

## 2016-04-17 DIAGNOSIS — I480 Paroxysmal atrial fibrillation: Secondary | ICD-10-CM

## 2016-04-17 DIAGNOSIS — I130 Hypertensive heart and chronic kidney disease with heart failure and stage 1 through stage 4 chronic kidney disease, or unspecified chronic kidney disease: Secondary | ICD-10-CM | POA: Diagnosis not present

## 2016-04-17 DIAGNOSIS — Z87891 Personal history of nicotine dependence: Secondary | ICD-10-CM | POA: Insufficient documentation

## 2016-04-17 DIAGNOSIS — E785 Hyperlipidemia, unspecified: Secondary | ICD-10-CM | POA: Diagnosis not present

## 2016-04-17 DIAGNOSIS — I482 Chronic atrial fibrillation: Secondary | ICD-10-CM | POA: Diagnosis not present

## 2016-04-17 DIAGNOSIS — Z794 Long term (current) use of insulin: Secondary | ICD-10-CM | POA: Insufficient documentation

## 2016-04-17 DIAGNOSIS — I10 Essential (primary) hypertension: Secondary | ICD-10-CM

## 2016-04-17 DIAGNOSIS — Z951 Presence of aortocoronary bypass graft: Secondary | ICD-10-CM | POA: Insufficient documentation

## 2016-04-17 NOTE — Patient Instructions (Signed)
Resume weighing daily and call for an overnight weight gain of > 2 pounds or a weekly weight gain of >5 pounds. 
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
## 2016-04-18 ENCOUNTER — Telehealth: Payer: Self-pay | Admitting: Internal Medicine

## 2016-04-18 ENCOUNTER — Inpatient Hospital Stay: Payer: Medicare Other

## 2016-04-18 ENCOUNTER — Encounter: Payer: Self-pay | Admitting: Family

## 2016-04-18 DIAGNOSIS — D509 Iron deficiency anemia, unspecified: Secondary | ICD-10-CM | POA: Diagnosis not present

## 2016-04-18 DIAGNOSIS — I5032 Chronic diastolic (congestive) heart failure: Secondary | ICD-10-CM | POA: Diagnosis not present

## 2016-04-18 DIAGNOSIS — I482 Chronic atrial fibrillation: Secondary | ICD-10-CM | POA: Diagnosis not present

## 2016-04-18 DIAGNOSIS — E1122 Type 2 diabetes mellitus with diabetic chronic kidney disease: Secondary | ICD-10-CM | POA: Diagnosis not present

## 2016-04-18 DIAGNOSIS — N184 Chronic kidney disease, stage 4 (severe): Secondary | ICD-10-CM | POA: Diagnosis not present

## 2016-04-18 DIAGNOSIS — I130 Hypertensive heart and chronic kidney disease with heart failure and stage 1 through stage 4 chronic kidney disease, or unspecified chronic kidney disease: Secondary | ICD-10-CM | POA: Diagnosis not present

## 2016-04-18 NOTE — Telephone Encounter (Signed)
Please advise 

## 2016-04-18 NOTE — Telephone Encounter (Signed)
Vaughan Basta from [facility] called and wanted clarification on pt's medication. They wanted to know if he should be taking both calcitRIOL (ROCALTROL) 0.25 MCG capsule and cholecalciferol (VITAMIN D) 1000 units tablet. Please advise, thank you!  Call [place] @ [phone]

## 2016-04-19 DIAGNOSIS — E1122 Type 2 diabetes mellitus with diabetic chronic kidney disease: Secondary | ICD-10-CM | POA: Diagnosis not present

## 2016-04-19 DIAGNOSIS — I130 Hypertensive heart and chronic kidney disease with heart failure and stage 1 through stage 4 chronic kidney disease, or unspecified chronic kidney disease: Secondary | ICD-10-CM | POA: Diagnosis not present

## 2016-04-19 DIAGNOSIS — D509 Iron deficiency anemia, unspecified: Secondary | ICD-10-CM | POA: Diagnosis not present

## 2016-04-19 DIAGNOSIS — N184 Chronic kidney disease, stage 4 (severe): Secondary | ICD-10-CM | POA: Diagnosis not present

## 2016-04-19 DIAGNOSIS — I482 Chronic atrial fibrillation: Secondary | ICD-10-CM | POA: Diagnosis not present

## 2016-04-19 DIAGNOSIS — I5032 Chronic diastolic (congestive) heart failure: Secondary | ICD-10-CM | POA: Diagnosis not present

## 2016-04-23 ENCOUNTER — Ambulatory Visit (INDEPENDENT_AMBULATORY_CARE_PROVIDER_SITE_OTHER): Payer: Medicare Other | Admitting: Internal Medicine

## 2016-04-23 ENCOUNTER — Encounter: Payer: Self-pay | Admitting: Internal Medicine

## 2016-04-23 VITALS — BP 138/68 | HR 74 | Temp 98.0°F | Resp 12 | Ht 70.0 in | Wt 245.8 lb

## 2016-04-23 DIAGNOSIS — I2510 Atherosclerotic heart disease of native coronary artery without angina pectoris: Secondary | ICD-10-CM

## 2016-04-23 DIAGNOSIS — N183 Chronic kidney disease, stage 3 unspecified: Secondary | ICD-10-CM

## 2016-04-23 DIAGNOSIS — E1122 Type 2 diabetes mellitus with diabetic chronic kidney disease: Secondary | ICD-10-CM | POA: Diagnosis not present

## 2016-04-23 DIAGNOSIS — K31819 Angiodysplasia of stomach and duodenum without bleeding: Secondary | ICD-10-CM

## 2016-04-23 DIAGNOSIS — I130 Hypertensive heart and chronic kidney disease with heart failure and stage 1 through stage 4 chronic kidney disease, or unspecified chronic kidney disease: Secondary | ICD-10-CM | POA: Diagnosis not present

## 2016-04-23 DIAGNOSIS — I5032 Chronic diastolic (congestive) heart failure: Secondary | ICD-10-CM | POA: Diagnosis not present

## 2016-04-23 DIAGNOSIS — E119 Type 2 diabetes mellitus without complications: Secondary | ICD-10-CM

## 2016-04-23 DIAGNOSIS — N184 Chronic kidney disease, stage 4 (severe): Secondary | ICD-10-CM | POA: Diagnosis not present

## 2016-04-23 DIAGNOSIS — D509 Iron deficiency anemia, unspecified: Secondary | ICD-10-CM | POA: Diagnosis not present

## 2016-04-23 DIAGNOSIS — I482 Chronic atrial fibrillation: Secondary | ICD-10-CM | POA: Diagnosis not present

## 2016-04-23 DIAGNOSIS — E1121 Type 2 diabetes mellitus with diabetic nephropathy: Secondary | ICD-10-CM

## 2016-04-23 DIAGNOSIS — D631 Anemia in chronic kidney disease: Secondary | ICD-10-CM

## 2016-04-23 NOTE — Progress Notes (Addendum)
Subjective:  Patient ID: [name], male    DOB: 09/03/1927  Age: 82 y.o. MRN: [id number]  CC: The primary encounter diagnosis was Diabetes mellitus without complication ([place]). 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  HPI [name] presents for  follow up for recurrent symptomatic anemia and other chronic issues   Patient was admitted on Oct 13 to [facility] with symptomatic anemia, endorsing shortness of breath with minimal exertion .  Dx was acute on chronic diastolic failure secondary to anemia.  He was given an iron infusion but no blood products because his repeat hgb was 8.4 .  He was diuresed and discharged home on Oct 15.   Hemoglobin dropped to 7.1  11 days later and he received a transfusion at the [facility] of 1 unit.  He finally received  procrit for hgb of 8.2 on NOV 6 AT [facility]  10,000 UNITS . He feels better already and is willing to accept the risk of thrombotic stroke , because for the last 6 months he has had no quality of life due to need for frequent transfusions of blood.   2) TYPE 2 DM: SUGARS have been ranging from  124 to 150 .  No lows.  Tolerating medications without side effects .  Foot exam done today   Lab Results  Component Value Date   HGBA1C 6.8 (H) 09/20/2015     Outpatient Medications Prior to Visit  Medication Sig Dispense Refill  . acetaminophen (TYLENOL) 325 MG tablet Take 2 tablets (650 mg total) by mouth every 6 (six) hours as needed for mild pain (or Fever >/= 101).    [name] amLODipine (NORVASC) 5 MG tablet TAKE 1 TABLET EVERY DAY 30 tablet 7  . aspirin EC 81 MG tablet Take 1 tablet (81 mg total) by mouth daily. 90 tablet 3  . calcitRIOL (ROCALTROL) 0.25 MCG capsule TAKE ONE CAPSULE THREE TIMES A WEEK (MONDAY, WEDNESDAY, AND FRIDAY) 15  capsule 5  . carvedilol (COREG) 6.25 MG tablet Take 1 tablet (6.25 mg total) by mouth 2 (two) times daily. 180 tablet 0  . cholecalciferol (VITAMIN D) 1000 units tablet Take 1,000 Units by mouth daily.    [name] escitalopram (LEXAPRO) 10 MG tablet TAKE 1 TABLET EVERY DAY WITH DINNER 30 tablet 3  . ferrous sulfate 325 (65 FE) MG tablet Take 325 mg by mouth daily with breakfast.    . furosemide (LASIX) 40 MG tablet Take 1 tablet (40 mg total) by mouth daily. 30 tablet 0  . HYDROcodone-acetaminophen (NORCO/VICODIN) 5-325 MG tablet Take 1 tablet by mouth daily as needed for moderate pain. 30 tablet 0  . hydroxychloroquine (PLAQUENIL) 200 MG tablet Take 200 mg by mouth daily.    . insulin aspart (NOVOLOG) 100 UNIT/ML injection Inject 3-7 Units into the skin 3 (three) times daily with meals as needed for high blood sugar. Pt uses as needed per sliding scale:    100-150:  3 units  151-200:  4 units  201-250:  5 units  Greater than 250:  7 units    . insulin glargine (LANTUS) 100 UNIT/ML injection Inject 50 Units into the skin every morning.     . isosorbide mononitrate (IMDUR) 30 MG 24 hr tablet Take 30 mg by mouth daily.    . Multiple Vitamin (MULTIVITAMIN  WITH MINERALS) TABS tablet Take 1 tablet by mouth daily.    . Multiple Vitamins-Minerals (PRESERVISION AREDS 2) CAPS Take 1 capsule by mouth 2 (two) times daily.    . pantoprazole (PROTONIX) 40 MG tablet Take 1 tablet (40 mg total) by mouth daily. 90 tablet 3  . tamsulosin (FLOMAX) 0.4 MG CAPS capsule TAKE 1 CAPSULE BY MOUTH DAILY AFTER SUPPER 30 capsule 5  . vitamin C (ASCORBIC ACID) 500 MG tablet Take 500 mg by mouth daily.     No facility-administered medications prior to visit.     Review of Systems;  Patient denies headache, fevers, malaise, unintentional weight loss, skin rash, eye pain, sinus congestion and sinus pain, sore throat, dysphagia,  hemoptysis , cough, dyspnea, wheezing, chest pain, palpitations, orthopnea, edema, abdominal pain,  nausea, melena, diarrhea, constipation, flank pain, dysuria, hematuria, urinary  Frequency, nocturia, numbness, tingling, seizures,  Focal weakness, Loss of consciousness,  Tremor, insomnia, depression, anxiety, and suicidal ideation.      Objective:  BP 138/68   Pulse 74   Temp 98 F (36.7 C) (Oral)   Resp 12   Ht 5\' 10"  (1.778 m)   Wt 245 lb 12 oz (111.5 kg)   SpO2 97%   BMI 35.26 kg/m   BP Readings from Last 3 Encounters:  04/23/16 138/68  04/17/16 (!) 141/48  04/15/16 (!) 156/61    Wt Readings from Last 3 Encounters:  04/23/16 245 lb 12 oz (111.5 kg)  04/17/16 251 lb (113.9 kg)  04/04/16 242 lb 5 oz (109.9 kg)    General appearance: alert, cooperative and appears stated age Ears: normal TM's and external ear canals both ears Throat: lips, mucosa, and tongue normal; teeth and gums normal Neck: no adenopathy, no carotid bruit, supple, symmetrical, trachea midline and thyroid not enlarged, symmetric, no tenderness/mass/nodules Back: symmetric, no curvature. ROM normal. No CVA tenderness. Lungs: clear to auscultation bilaterally Heart: regular rate and rhythm, S1, S2 normal, no murmur, click, rub or gallop Abdomen: soft, non-tender; bowel sounds normal; no masses,  no organomegaly Pulses: 2+ and symmetric Skin: Skin color, texture, turgor normal. No rashes or lesions Lymph nodes: Cervical, supraclavicular, and axillary nodes normal.  Lab Results  Component Value Date   HGBA1C 6.8 (H) 09/20/2015   HGBA1C 6.9 (H) 06/22/2015   HGBA1C 7.1 (A) 03/06/2015    Lab Results  Component Value Date   CREATININE 2.16 (H) 04/04/2016   CREATININE 2.35 (H) 03/24/2016   CREATININE 2.12 (H) 03/23/2016    Lab Results  Component Value Date   WBC 5.8 04/15/2016   HGB 8.3 (L) 04/15/2016   HCT 25.2 (L) 04/15/2016   PLT 178 04/15/2016   GLUCOSE 73 04/04/2016   CHOL 129 01/18/2015   TRIG 58.0 01/18/2015   HDL 45.00 01/18/2015   LDLDIRECT 56.0 06/22/2015   LDLCALC 72 01/18/2015     ALT 14 (L) 04/04/2016   AST 22 04/04/2016   NA 130 (L) 04/04/2016   K 4.7 04/04/2016   CL 98 (L) 04/04/2016   CREATININE 2.16 (H) 04/04/2016   BUN 39 (H) 04/04/2016   CO2 23 04/04/2016   TSH 4.231 11/21/2015   PSA 0.48 09/07/2014   INR 1.18 09/19/2015   HGBA1C 6.8 (H) 09/20/2015   MICROALBUR 70.5 (H) 06/21/2015    Dg Chest 2 View  Result Date: 03/22/2016 CLINICAL DATA:  Shortness of Breath EXAM: CHEST  2 VIEW COMPARISON:  07/10/2015 FINDINGS: Prior median sternotomy and valve replacement. Mild cardiomegaly. No confluent opacities. Small bilateral effusions.  No overt edema. IMPRESSION: Cardiomegaly.  Small bilateral effusions.  No overt failure. Electronically Signed   By: Rolm Baptise M.D.   On: 03/22/2016 12:57    Assessment & Plan:   Problem List Items Addressed This Visit    Angiodysplasia of stomach    Resulting in chronic blood loss and recurrent blood transfusions for symptomatic anemia.       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  But he is overdue for 6 month follow up .  DM labs have been ordered and will be collected next week with his repeat CBC   Lab Results  Component Value Date   HGBA1C 6.8 (H) 09/20/2015   Lab Results  Component Value Date   MICROALBUR 70.5 (H) 06/21/2015         CKD (chronic kidney disease) stage 3, GFR 30-59 ml/min    Referred to [facility] for management of CKD , but his anemia is managed by [facility] .  He is avoiding NSAIDs and other nephrotoxic agents, and tolerating losartan .  Lab Results  Component Value Date   NA 130 (L) 04/04/2016   K 4.7 04/04/2016   CL 98 (L) 04/04/2016   CO2 23 04/04/2016   Lab Results  Component Value Date    CREATININE 2.16 (H) 04/04/2016            Other Visit Diagnoses    Diabetes mellitus without complication ([place])    -  Primary   Relevant Orders   Hemoglobin A1c      I am having Mr. Daoust maintain his ferrous sulfate, insulin aspart, hydroxychloroquine, vitamin C, multivitamin with minerals, insulin glargine, PRESERVISION AREDS 2, cholecalciferol, acetaminophen, tamsulosin, calcitRIOL, escitalopram, aspirin EC, carvedilol, amLODipine, isosorbide mononitrate, furosemide, HYDROcodone-acetaminophen, and pantoprazole.  No orders of the defined types were placed in this encounter.   There are no discontinued medications.  Follow-up: No Follow-up on file.   [name], MD

## 2016-04-23 NOTE — Progress Notes (Signed)
Pre-visit discussion using our clinic review tool. No additional management support is needed unless otherwise documented below in the visit note.  

## 2016-04-23 NOTE — Patient Instructions (Signed)
I have ordered the A1c to be done next week with your labs at the hospital  If you do not hear from me in 2 weeks,  Call to let me know  I'm glad you are feeling better

## 2016-04-25 ENCOUNTER — Other Ambulatory Visit: Payer: Self-pay | Admitting: *Deleted

## 2016-04-25 ENCOUNTER — Telehealth: Payer: Self-pay | Admitting: *Deleted

## 2016-04-25 DIAGNOSIS — I130 Hypertensive heart and chronic kidney disease with heart failure and stage 1 through stage 4 chronic kidney disease, or unspecified chronic kidney disease: Secondary | ICD-10-CM | POA: Diagnosis not present

## 2016-04-25 DIAGNOSIS — N184 Chronic kidney disease, stage 4 (severe): Secondary | ICD-10-CM | POA: Diagnosis not present

## 2016-04-25 DIAGNOSIS — I5032 Chronic diastolic (congestive) heart failure: Secondary | ICD-10-CM | POA: Diagnosis not present

## 2016-04-25 DIAGNOSIS — I482 Chronic atrial fibrillation: Secondary | ICD-10-CM | POA: Diagnosis not present

## 2016-04-25 DIAGNOSIS — D509 Iron deficiency anemia, unspecified: Secondary | ICD-10-CM | POA: Diagnosis not present

## 2016-04-25 DIAGNOSIS — E1122 Type 2 diabetes mellitus with diabetic chronic kidney disease: Secondary | ICD-10-CM | POA: Diagnosis not present

## 2016-04-25 DIAGNOSIS — D508 Other iron deficiency anemias: Secondary | ICD-10-CM

## 2016-04-25 NOTE — Telephone Encounter (Signed)
[employer] requested to know if the approval form for diabetic supplies was received.  Fax [phone]

## 2016-04-25 NOTE — Assessment & Plan Note (Signed)
Resulting in chronic blood loss and recurrent blood transfusions for symptomatic anemia.

## 2016-04-25 NOTE — Assessment & Plan Note (Signed)
Referred to [facility] for management of CKD , but his anemia is managed by [facility] .  He is avoiding NSAIDs and other nephrotoxic agents, and tolerating losartan .  Lab Results  Component Value Date   NA 130 (L) 04/04/2016   K 4.7 04/04/2016   CL 98 (L) 04/04/2016   CO2 23 04/04/2016   Lab Results  Component Value Date   CREATININE 2.16 (H) 04/04/2016

## 2016-04-25 NOTE — Assessment & Plan Note (Signed)
Received IV iron during October admission for symptomatic anemia,  And Procrit during recent post hospitalization drop in hgb to 7.1

## 2016-04-25 NOTE — Assessment & Plan Note (Addendum)
His A1c has been at  goal,  And he has had no recent hypoglycemic events.  But he is overdue for 6 month follow up .  DM labs have been ordered and will be collected next week with his repeat CBC   Lab Results  Component Value Date   HGBA1C 6.8 (H) 09/20/2015   Lab Results  Component Value Date   MICROALBUR 70.5 (H) 06/21/2015

## 2016-04-29 ENCOUNTER — Inpatient Hospital Stay (HOSPITAL_BASED_OUTPATIENT_CLINIC_OR_DEPARTMENT_OTHER): Payer: Medicare Other | Admitting: Hematology and Oncology

## 2016-04-29 ENCOUNTER — Inpatient Hospital Stay: Payer: Medicare Other

## 2016-04-29 VITALS — BP 137/65 | HR 69 | Temp 98.2°F | Resp 18 | Wt 244.0 lb

## 2016-04-29 DIAGNOSIS — D500 Iron deficiency anemia secondary to blood loss (chronic): Secondary | ICD-10-CM

## 2016-04-29 DIAGNOSIS — Z8673 Personal history of transient ischemic attack (TIA), and cerebral infarction without residual deficits: Secondary | ICD-10-CM

## 2016-04-29 DIAGNOSIS — E785 Hyperlipidemia, unspecified: Secondary | ICD-10-CM

## 2016-04-29 DIAGNOSIS — I2510 Atherosclerotic heart disease of native coronary artery without angina pectoris: Secondary | ICD-10-CM

## 2016-04-29 DIAGNOSIS — I129 Hypertensive chronic kidney disease with stage 1 through stage 4 chronic kidney disease, or unspecified chronic kidney disease: Secondary | ICD-10-CM

## 2016-04-29 DIAGNOSIS — Z7982 Long term (current) use of aspirin: Secondary | ICD-10-CM

## 2016-04-29 DIAGNOSIS — R531 Weakness: Secondary | ICD-10-CM

## 2016-04-29 DIAGNOSIS — N184 Chronic kidney disease, stage 4 (severe): Principal | ICD-10-CM

## 2016-04-29 DIAGNOSIS — R001 Bradycardia, unspecified: Secondary | ICD-10-CM

## 2016-04-29 DIAGNOSIS — N189 Chronic kidney disease, unspecified: Secondary | ICD-10-CM

## 2016-04-29 DIAGNOSIS — Z8551 Personal history of malignant neoplasm of bladder: Secondary | ICD-10-CM

## 2016-04-29 DIAGNOSIS — D631 Anemia in chronic kidney disease: Secondary | ICD-10-CM

## 2016-04-29 DIAGNOSIS — Z8042 Family history of malignant neoplasm of prostate: Secondary | ICD-10-CM

## 2016-04-29 DIAGNOSIS — I350 Nonrheumatic aortic (valve) stenosis: Secondary | ICD-10-CM

## 2016-04-29 DIAGNOSIS — I482 Chronic atrial fibrillation: Secondary | ICD-10-CM

## 2016-04-29 DIAGNOSIS — R0602 Shortness of breath: Secondary | ICD-10-CM

## 2016-04-29 DIAGNOSIS — M1990 Unspecified osteoarthritis, unspecified site: Secondary | ICD-10-CM

## 2016-04-29 DIAGNOSIS — E119 Type 2 diabetes mellitus without complications: Secondary | ICD-10-CM

## 2016-04-29 DIAGNOSIS — K921 Melena: Secondary | ICD-10-CM

## 2016-04-29 DIAGNOSIS — Z87891 Personal history of nicotine dependence: Secondary | ICD-10-CM

## 2016-04-29 DIAGNOSIS — I5032 Chronic diastolic (congestive) heart failure: Secondary | ICD-10-CM

## 2016-04-29 DIAGNOSIS — Z79899 Other long term (current) drug therapy: Secondary | ICD-10-CM

## 2016-04-29 DIAGNOSIS — R5383 Other fatigue: Secondary | ICD-10-CM | POA: Diagnosis not present

## 2016-04-29 DIAGNOSIS — N400 Enlarged prostate without lower urinary tract symptoms: Secondary | ICD-10-CM

## 2016-04-29 DIAGNOSIS — Z7901 Long term (current) use of anticoagulants: Secondary | ICD-10-CM

## 2016-04-29 DIAGNOSIS — Z794 Long term (current) use of insulin: Secondary | ICD-10-CM

## 2016-04-29 DIAGNOSIS — D508 Other iron deficiency anemias: Secondary | ICD-10-CM

## 2016-04-29 LAB — SAMPLE TO BLOOD BANK

## 2016-04-29 LAB — CBC WITH DIFFERENTIAL/PLATELET
Basophils Absolute: 0.1 10*3/uL (ref 0–0.1)
Basophils Relative: 1 %
Eosinophils Absolute: 0.3 10*3/uL (ref 0–0.7)
Eosinophils Relative: 4 %
HCT: 24.9 % — ABNORMAL LOW (ref 40.0–52.0)
Hemoglobin: 8.2 g/dL — ABNORMAL LOW (ref 13.0–18.0)
Lymphocytes Relative: 5 %
Lymphs Abs: 0.4 10*3/uL — ABNORMAL LOW (ref 1.0–3.6)
MCH: 28.8 pg (ref 26.0–34.0)
MCHC: 32.9 g/dL (ref 32.0–36.0)
MCV: 87.6 fL (ref 80.0–100.0)
Monocytes Absolute: 1 10*3/uL (ref 0.2–1.0)
Monocytes Relative: 13 %
Neutro Abs: 5.9 10*3/uL (ref 1.4–6.5)
Neutrophils Relative %: 77 %
Platelets: 187 10*3/uL (ref 150–440)
RBC: 2.85 MIL/uL — ABNORMAL LOW (ref 4.40–5.90)
RDW: 17.1 % — ABNORMAL HIGH (ref 11.5–14.5)
WBC: 7.6 10*3/uL (ref 3.8–10.6)

## 2016-04-29 MED ORDER — EPOETIN ALFA 10000 UNIT/ML IJ SOLN
10000.0000 [IU] | Freq: Once | INTRAMUSCULAR | Status: AC
  Administered 2016-04-29: 10000 [IU] via SUBCUTANEOUS
  Filled 2016-04-29: qty 2
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
## 2016-04-29 NOTE — Progress Notes (Signed)
Patient states that he is not feeling well due to a cold.

## 2016-04-30 ENCOUNTER — Other Ambulatory Visit: Payer: Self-pay | Admitting: *Deleted

## 2016-04-30 DIAGNOSIS — D509 Iron deficiency anemia, unspecified: Secondary | ICD-10-CM | POA: Diagnosis not present

## 2016-04-30 DIAGNOSIS — D631 Anemia in chronic kidney disease: Secondary | ICD-10-CM

## 2016-04-30 DIAGNOSIS — N184 Chronic kidney disease, stage 4 (severe): Principal | ICD-10-CM

## 2016-04-30 DIAGNOSIS — I482 Chronic atrial fibrillation: Secondary | ICD-10-CM | POA: Diagnosis not present

## 2016-04-30 DIAGNOSIS — I130 Hypertensive heart and chronic kidney disease with heart failure and stage 1 through stage 4 chronic kidney disease, or unspecified chronic kidney disease: Secondary | ICD-10-CM | POA: Diagnosis not present

## 2016-04-30 DIAGNOSIS — E1122 Type 2 diabetes mellitus with diabetic chronic kidney disease: Secondary | ICD-10-CM | POA: Diagnosis not present

## 2016-04-30 DIAGNOSIS — I5032 Chronic diastolic (congestive) heart failure: Secondary | ICD-10-CM | POA: Diagnosis not present

## 2016-04-30 NOTE — Telephone Encounter (Signed)
Do we have this form? 

## 2016-04-30 NOTE — Telephone Encounter (Signed)
Lynn Collins-PT from [facility] requesting two verbal orders. 1-social work 2-extension of in home therapy for additional 3 wks (she states his ends this week. Please call [phone]

## 2016-04-30 NOTE — Telephone Encounter (Signed)
Completed and placed in Quick sign folder.

## 2016-05-01 NOTE — Telephone Encounter (Signed)
Verbal given to extend therapy while talking with [facility]) she advised patient had afall last night but seemed fine today. FYI

## 2016-05-01 NOTE — Telephone Encounter (Signed)
agree

## 2016-05-02 ENCOUNTER — Encounter (HOSPITAL_COMMUNITY): Payer: Self-pay | Admitting: Emergency Medicine

## 2016-05-02 ENCOUNTER — Other Ambulatory Visit: Payer: Self-pay

## 2016-05-02 ENCOUNTER — Other Ambulatory Visit (HOSPITAL_COMMUNITY): Payer: Self-pay

## 2016-05-02 ENCOUNTER — Inpatient Hospital Stay (HOSPITAL_COMMUNITY): Payer: Medicare Other

## 2016-05-02 ENCOUNTER — Inpatient Hospital Stay (HOSPITAL_COMMUNITY)
Admission: EM | Admit: 2016-05-02 | Discharge: 2016-05-16 | DRG: 291 | Disposition: A | Discharge status: Skilled Nursing Facility | Payer: Medicare Other | Attending: Family Medicine | Admitting: Family Medicine

## 2016-05-02 ENCOUNTER — Emergency Department (HOSPITAL_COMMUNITY): Payer: Medicare Other

## 2016-05-02 DIAGNOSIS — Z833 Family history of diabetes mellitus: Secondary | ICD-10-CM

## 2016-05-02 DIAGNOSIS — Z8546 Personal history of malignant neoplasm of prostate: Secondary | ICD-10-CM

## 2016-05-02 DIAGNOSIS — I130 Hypertensive heart and chronic kidney disease with heart failure and stage 1 through stage 4 chronic kidney disease, or unspecified chronic kidney disease: Secondary | ICD-10-CM | POA: Diagnosis not present

## 2016-05-02 DIAGNOSIS — Z8551 Personal history of malignant neoplasm of bladder: Secondary | ICD-10-CM

## 2016-05-02 DIAGNOSIS — Z951 Presence of aortocoronary bypass graft: Secondary | ICD-10-CM

## 2016-05-02 DIAGNOSIS — H3530 Unspecified macular degeneration: Secondary | ICD-10-CM | POA: Diagnosis present

## 2016-05-02 DIAGNOSIS — J440 Chronic obstructive pulmonary disease with acute lower respiratory infection: Secondary | ICD-10-CM | POA: Diagnosis present

## 2016-05-02 DIAGNOSIS — I2510 Atherosclerotic heart disease of native coronary artery without angina pectoris: Secondary | ICD-10-CM | POA: Diagnosis present

## 2016-05-02 DIAGNOSIS — I5033 Acute on chronic diastolic (congestive) heart failure: Secondary | ICD-10-CM | POA: Diagnosis not present

## 2016-05-02 DIAGNOSIS — N183 Chronic kidney disease, stage 3 (moderate): Secondary | ICD-10-CM | POA: Diagnosis not present

## 2016-05-02 DIAGNOSIS — N184 Chronic kidney disease, stage 4 (severe): Secondary | ICD-10-CM | POA: Diagnosis not present

## 2016-05-02 DIAGNOSIS — R2689 Other abnormalities of gait and mobility: Secondary | ICD-10-CM | POA: Diagnosis not present

## 2016-05-02 DIAGNOSIS — D500 Iron deficiency anemia secondary to blood loss (chronic): Secondary | ICD-10-CM | POA: Diagnosis present

## 2016-05-02 DIAGNOSIS — J441 Chronic obstructive pulmonary disease with (acute) exacerbation: Secondary | ICD-10-CM | POA: Diagnosis not present

## 2016-05-02 DIAGNOSIS — E1121 Type 2 diabetes mellitus with diabetic nephropathy: Secondary | ICD-10-CM | POA: Diagnosis present

## 2016-05-02 DIAGNOSIS — R0602 Shortness of breath: Secondary | ICD-10-CM | POA: Diagnosis present

## 2016-05-02 DIAGNOSIS — Z8042 Family history of malignant neoplasm of prostate: Secondary | ICD-10-CM

## 2016-05-02 DIAGNOSIS — E785 Hyperlipidemia, unspecified: Secondary | ICD-10-CM | POA: Diagnosis present

## 2016-05-02 DIAGNOSIS — N179 Acute kidney failure, unspecified: Secondary | ICD-10-CM | POA: Diagnosis present

## 2016-05-02 DIAGNOSIS — Z794 Long term (current) use of insulin: Secondary | ICD-10-CM

## 2016-05-02 DIAGNOSIS — Z880 Allergy status to penicillin: Secondary | ICD-10-CM

## 2016-05-02 DIAGNOSIS — B999 Unspecified infectious disease: Secondary | ICD-10-CM | POA: Diagnosis not present

## 2016-05-02 DIAGNOSIS — E871 Hypo-osmolality and hyponatremia: Secondary | ICD-10-CM | POA: Diagnosis present

## 2016-05-02 DIAGNOSIS — R05 Cough: Secondary | ICD-10-CM | POA: Diagnosis not present

## 2016-05-02 DIAGNOSIS — I509 Heart failure, unspecified: Secondary | ICD-10-CM

## 2016-05-02 DIAGNOSIS — I5043 Acute on chronic combined systolic (congestive) and diastolic (congestive) heart failure: Secondary | ICD-10-CM | POA: Diagnosis not present

## 2016-05-02 DIAGNOSIS — F329 Major depressive disorder, single episode, unspecified: Secondary | ICD-10-CM | POA: Diagnosis present

## 2016-05-02 DIAGNOSIS — M25461 Effusion, right knee: Secondary | ICD-10-CM | POA: Diagnosis not present

## 2016-05-02 DIAGNOSIS — I69354 Hemiplegia and hemiparesis following cerebral infarction affecting left non-dominant side: Secondary | ICD-10-CM

## 2016-05-02 DIAGNOSIS — J189 Pneumonia, unspecified organism: Secondary | ICD-10-CM | POA: Diagnosis not present

## 2016-05-02 DIAGNOSIS — J449 Chronic obstructive pulmonary disease, unspecified: Secondary | ICD-10-CM | POA: Diagnosis not present

## 2016-05-02 DIAGNOSIS — N400 Enlarged prostate without lower urinary tract symptoms: Secondary | ICD-10-CM | POA: Diagnosis present

## 2016-05-02 DIAGNOSIS — E1122 Type 2 diabetes mellitus with diabetic chronic kidney disease: Secondary | ICD-10-CM | POA: Diagnosis present

## 2016-05-02 DIAGNOSIS — Z7982 Long term (current) use of aspirin: Secondary | ICD-10-CM

## 2016-05-02 DIAGNOSIS — M6281 Muscle weakness (generalized): Secondary | ICD-10-CM

## 2016-05-02 DIAGNOSIS — K31819 Angiodysplasia of stomach and duodenum without bleeding: Secondary | ICD-10-CM | POA: Diagnosis present

## 2016-05-02 DIAGNOSIS — I5023 Acute on chronic systolic (congestive) heart failure: Secondary | ICD-10-CM | POA: Diagnosis not present

## 2016-05-02 DIAGNOSIS — Z79899 Other long term (current) drug therapy: Secondary | ICD-10-CM

## 2016-05-02 DIAGNOSIS — K5900 Constipation, unspecified: Secondary | ICD-10-CM | POA: Diagnosis present

## 2016-05-02 DIAGNOSIS — D631 Anemia in chronic kidney disease: Secondary | ICD-10-CM | POA: Diagnosis present

## 2016-05-02 DIAGNOSIS — I482 Chronic atrial fibrillation: Secondary | ICD-10-CM | POA: Diagnosis not present

## 2016-05-02 DIAGNOSIS — I472 Ventricular tachycardia: Secondary | ICD-10-CM | POA: Diagnosis present

## 2016-05-02 DIAGNOSIS — E1142 Type 2 diabetes mellitus with diabetic polyneuropathy: Secondary | ICD-10-CM | POA: Diagnosis present

## 2016-05-02 DIAGNOSIS — D638 Anemia in other chronic diseases classified elsewhere: Secondary | ICD-10-CM | POA: Diagnosis not present

## 2016-05-02 DIAGNOSIS — R609 Edema, unspecified: Secondary | ICD-10-CM | POA: Diagnosis not present

## 2016-05-02 DIAGNOSIS — I350 Nonrheumatic aortic (valve) stenosis: Secondary | ICD-10-CM | POA: Diagnosis not present

## 2016-05-02 DIAGNOSIS — E1165 Type 2 diabetes mellitus with hyperglycemia: Secondary | ICD-10-CM | POA: Diagnosis present

## 2016-05-02 DIAGNOSIS — M1711 Unilateral primary osteoarthritis, right knee: Secondary | ICD-10-CM | POA: Diagnosis present

## 2016-05-02 DIAGNOSIS — N170 Acute kidney failure with tubular necrosis: Secondary | ICD-10-CM | POA: Diagnosis not present

## 2016-05-02 DIAGNOSIS — Z953 Presence of xenogenic heart valve: Secondary | ICD-10-CM

## 2016-05-02 DIAGNOSIS — M25561 Pain in right knee: Secondary | ICD-10-CM | POA: Diagnosis not present

## 2016-05-02 DIAGNOSIS — Z823 Family history of stroke: Secondary | ICD-10-CM

## 2016-05-02 DIAGNOSIS — I4891 Unspecified atrial fibrillation: Secondary | ICD-10-CM | POA: Diagnosis not present

## 2016-05-02 DIAGNOSIS — Z87891 Personal history of nicotine dependence: Secondary | ICD-10-CM

## 2016-05-02 DIAGNOSIS — R069 Unspecified abnormalities of breathing: Secondary | ICD-10-CM | POA: Diagnosis not present

## 2016-05-02 DIAGNOSIS — Z66 Do not resuscitate: Secondary | ICD-10-CM | POA: Diagnosis present

## 2016-05-02 LAB — COMPREHENSIVE METABOLIC PANEL
ALT: 19 U/L (ref 17–63)
AST: 34 U/L (ref 15–41)
Albumin: 3.5 g/dL (ref 3.5–5.0)
Alkaline Phosphatase: 108 U/L (ref 38–126)
Anion gap: 11 (ref 5–15)
BUN: 46 mg/dL — ABNORMAL HIGH (ref 6–20)
CHLORIDE: 93 mmol/L — AB (ref 101–111)
CO2: 24 mmol/L (ref 22–32)
CREATININE: 2.44 mg/dL — AB (ref 0.61–1.24)
Calcium: 8.9 mg/dL (ref 8.9–10.3)
GFR, EST AFRICAN AMERICAN: 26 mL/min — AB (ref >60)
GFR, EST NON AFRICAN AMERICAN: 22 mL/min — AB (ref >60)
Glucose, Bld: 165 mg/dL — ABNORMAL HIGH (ref 65–99)
POTASSIUM: 4.5 mmol/L (ref 3.5–5.1)
Sodium: 128 mmol/L — ABNORMAL LOW (ref 135–145)
Total Bilirubin: 0.7 mg/dL (ref 0.3–1.2)
Total Protein: 6.7 g/dL (ref 6.5–8.1)

## 2016-05-02 LAB — CBC
HCT: 26 % — ABNORMAL LOW (ref 39.0–52.0)
HEMATOCRIT: 25 % — AB (ref 39.0–52.0)
Hemoglobin: 8.1 g/dL — ABNORMAL LOW (ref 13.0–17.0)
Hemoglobin: 8 g/dL — ABNORMAL LOW (ref 13.0–17.0)
MCH: 27.6 pg (ref 26.0–34.0)
MCH: 28.1 pg (ref 26.0–34.0)
MCHC: 31.2 g/dL (ref 30.0–36.0)
MCHC: 32 g/dL (ref 30.0–36.0)
MCV: 88.4 fL (ref 78.0–100.0)
MCV: 87.7 fL (ref 78.0–100.0)
PLATELETS: 194 10*3/uL (ref 150–400)
Platelets: 205 10*3/uL (ref 150–400)
RBC: 2.94 MIL/uL — ABNORMAL LOW (ref 4.22–5.81)
RBC: 2.85 MIL/uL — AB (ref 4.22–5.81)
RDW: 15.3 % (ref 11.5–15.5)
RDW: 15.3 % (ref 11.5–15.5)
WBC: 8.6 10*3/uL (ref 4.0–10.5)
WBC: 6.8 10*3/uL (ref 4.0–10.5)

## 2016-05-02 LAB — URINALYSIS, ROUTINE W REFLEX MICROSCOPIC
Bilirubin Urine: NEGATIVE
Glucose, UA: 500 mg/dL — AB
Ketones, ur: NEGATIVE mg/dL
LEUKOCYTES UA: NEGATIVE
NITRITE: NEGATIVE
PROTEIN: 30 mg/dL — AB
SPECIFIC GRAVITY, URINE: 1.012 (ref 1.005–1.030)
pH: 5 (ref 5.0–8.0)

## 2016-05-02 LAB — CREATININE, SERUM
Creatinine, Ser: 2.5 mg/dL — ABNORMAL HIGH (ref 0.61–1.24)
GFR calc non Af Amer: 22 mL/min — ABNORMAL LOW (ref >60)
GFR, EST AFRICAN AMERICAN: 25 mL/min — AB (ref >60)

## 2016-05-02 LAB — GLUCOSE, CAPILLARY
GLUCOSE-CAPILLARY: 418 mg/dL — AB (ref 65–99)
Glucose-Capillary: 301 mg/dL — ABNORMAL HIGH (ref 65–99)

## 2016-05-02 LAB — URINE MICROSCOPIC-ADD ON: Bacteria, UA: NONE SEEN

## 2016-05-02 LAB — STREP PNEUMONIAE URINARY ANTIGEN: STREP PNEUMO URINARY ANTIGEN: NEGATIVE

## 2016-05-02 LAB — PREPARE RBC (CROSSMATCH)

## 2016-05-02 LAB — BRAIN NATRIURETIC PEPTIDE: B NATRIURETIC PEPTIDE 5: 243.8 pg/mL — AB (ref 0.0–100.0)

## 2016-05-02 LAB — CBG MONITORING, ED: Glucose-Capillary: 350 mg/dL — ABNORMAL HIGH (ref 65–99)

## 2016-05-02 LAB — RETICULOCYTES
RBC.: 2.85 MIL/uL — AB (ref 4.22–5.81)
RETIC COUNT ABSOLUTE: 45.6 10*3/uL (ref 19.0–186.0)
RETIC CT PCT: 1.6 % (ref 0.4–3.1)

## 2016-05-02 LAB — TROPONIN I

## 2016-05-02 MED ORDER — ESCITALOPRAM OXALATE 10 MG PO TABS
10.0000 mg | ORAL_TABLET | Freq: Every day | ORAL | Status: DC
  Administered 2016-05-03 – 2016-05-16 (×14): 10 mg via ORAL
  Filled 2016-05-02 (×14): qty 1

## 2016-05-02 MED ORDER — VITAMIN D 1000 UNITS PO TABS
1000.0000 [IU] | ORAL_TABLET | Freq: Every day | ORAL | Status: DC
  Administered 2016-05-03 – 2016-05-16 (×14): 1000 [IU] via ORAL
  Filled 2016-05-02 (×14): qty 1

## 2016-05-02 MED ORDER — CARVEDILOL 6.25 MG PO TABS: 6.2500 mg | ORAL_TABLET | Freq: Two times a day (BID) | ORAL | Status: DC

## 2016-05-02 MED ORDER — ENOXAPARIN SODIUM 30 MG/0.3ML ~~LOC~~ SOLN
30.0000 mg | SUBCUTANEOUS | Status: DC
  Administered 2016-05-02 – 2016-05-05 (×4): 30 mg via SUBCUTANEOUS
  Filled 2016-05-02 (×4): qty 0.3

## 2016-05-02 MED ORDER — TAMSULOSIN HCL 0.4 MG PO CAPS
0.4000 mg | ORAL_CAPSULE | Freq: Every day | ORAL | Status: DC
  Administered 2016-05-02 – 2016-05-15 (×14): 0.4 mg via ORAL
  Filled 2016-05-02 (×13): qty 1

## 2016-05-02 MED ORDER — DEXTROSE 5 % IV SOLN
1.0000 g | INTRAVENOUS | Status: DC
  Administered 2016-05-03 – 2016-05-06 (×4): 1 g via INTRAVENOUS
  Filled 2016-05-02 (×4): qty 10

## 2016-05-02 MED ORDER — HYDROXYCHLOROQUINE SULFATE 200 MG PO TABS
200.0000 mg | ORAL_TABLET | Freq: Every day | ORAL | Status: DC
  Administered 2016-05-03 – 2016-05-16 (×14): 200 mg via ORAL
  Filled 2016-05-02 (×14): qty 1

## 2016-05-02 MED ORDER — FERROUS SULFATE 325 (65 FE) MG PO TABS
325.0000 mg | ORAL_TABLET | Freq: Every day | ORAL | Status: DC
Start: 2016-05-03 — End: 2016-05-16
  Administered 2016-05-03 – 2016-05-16 (×14): 325 mg via ORAL
  Filled 2016-05-02 (×14): qty 1

## 2016-05-02 MED ORDER — IPRATROPIUM-ALBUTEROL 0.5-2.5 (3) MG/3ML IN SOLN
3.0000 mL | Freq: Three times a day (TID) | RESPIRATORY_TRACT | Status: DC
  Administered 2016-05-03 – 2016-05-04 (×4): 3 mL via RESPIRATORY_TRACT
  Filled 2016-05-02 (×4): qty 3

## 2016-05-02 MED ORDER — AMLODIPINE BESYLATE 5 MG PO TABS
5.0000 mg | ORAL_TABLET | Freq: Every day | ORAL | Status: DC
  Administered 2016-05-03 – 2016-05-08 (×6): 5 mg via ORAL
  Filled 2016-05-02 (×6): qty 1

## 2016-05-02 MED ORDER — IPRATROPIUM-ALBUTEROL 0.5-2.5 (3) MG/3ML IN SOLN
3.0000 mL | RESPIRATORY_TRACT | Status: AC
  Administered 2016-05-02 (×3): 3 mL via RESPIRATORY_TRACT
  Filled 2016-05-02: qty 9

## 2016-05-02 MED ORDER — IPRATROPIUM-ALBUTEROL 0.5-2.5 (3) MG/3ML IN SOLN
3.0000 mL | Freq: Four times a day (QID) | RESPIRATORY_TRACT | Status: DC
  Administered 2016-05-02 (×2): 3 mL via RESPIRATORY_TRACT
  Filled 2016-05-02 (×2): qty 3

## 2016-05-02 MED ORDER — AZITHROMYCIN 500 MG PO TABS
500.0000 mg | ORAL_TABLET | ORAL | Status: DC
  Administered 2016-05-03 – 2016-05-06 (×4): 500 mg via ORAL
  Filled 2016-05-02 (×4): qty 1

## 2016-05-02 MED ORDER — IPRATROPIUM BROMIDE 0.02 % IN SOLN: 1.0000 mg | Freq: Once | RESPIRATORY_TRACT | Status: DC

## 2016-05-02 MED ORDER — ACETAMINOPHEN 325 MG PO TABS
650.0000 mg | ORAL_TABLET | Freq: Four times a day (QID) | ORAL | Status: DC | PRN
  Administered 2016-05-05 – 2016-05-15 (×9): 650 mg via ORAL
  Filled 2016-05-02 (×10): qty 2

## 2016-05-02 MED ORDER — POLYETHYLENE GLYCOL 3350 17 G PO PACK
17.0000 g | PACK | Freq: Every day | ORAL | Status: DC
  Administered 2016-05-03 – 2016-05-15 (×4): 17 g via ORAL
  Filled 2016-05-02 (×12): qty 1

## 2016-05-02 MED ORDER — DEXTROSE 5 % IV SOLN
1.0000 g | Freq: Once | INTRAVENOUS | Status: AC
  Administered 2016-05-02: 1 g via INTRAVENOUS
  Filled 2016-05-02: qty 10

## 2016-05-02 MED ORDER — INSULIN ASPART 100 UNIT/ML ~~LOC~~ SOLN
7.0000 [IU] | Freq: Once | SUBCUTANEOUS | Status: AC
  Administered 2016-05-02: 7 [IU] via SUBCUTANEOUS

## 2016-05-02 MED ORDER — ALBUTEROL (5 MG/ML) CONTINUOUS INHALATION SOLN: 15.0000 mg/h | INHALATION_SOLUTION | Freq: Once | RESPIRATORY_TRACT | Status: DC

## 2016-05-02 MED ORDER — CARVEDILOL 6.25 MG PO TABS
6.2500 mg | ORAL_TABLET | Freq: Two times a day (BID) | ORAL | Status: DC
  Administered 2016-05-02 – 2016-05-16 (×28): 6.25 mg via ORAL
  Filled 2016-05-02 (×28): qty 1

## 2016-05-02 MED ORDER — INSULIN ASPART 100 UNIT/ML ~~LOC~~ SOLN
0.0000 [IU] | Freq: Three times a day (TID) | SUBCUTANEOUS | Status: DC
  Administered 2016-05-02: 9 [IU] via SUBCUTANEOUS
  Administered 2016-05-03 (×2): 7 [IU] via SUBCUTANEOUS

## 2016-05-02 MED ORDER — FUROSEMIDE 40 MG PO TABS: 40.0000 mg | ORAL_TABLET | Freq: Every day | ORAL | Status: DC

## 2016-05-02 MED ORDER — DEXTROSE 5 % IV SOLN
500.0000 mg | Freq: Once | INTRAVENOUS | Status: AC
  Administered 2016-05-02: 500 mg via INTRAVENOUS
  Filled 2016-05-02: qty 500

## 2016-05-02 MED ORDER — IPRATROPIUM-ALBUTEROL 0.5-2.5 (3) MG/3ML IN SOLN
3.0000 mL | Freq: Four times a day (QID) | RESPIRATORY_TRACT | Status: DC | PRN
  Administered 2016-05-05: 3 mL via RESPIRATORY_TRACT
  Filled 2016-05-02 (×2): qty 3

## 2016-05-02 MED ORDER — ASPIRIN EC 81 MG PO TBEC
81.0000 mg | DELAYED_RELEASE_TABLET | Freq: Every day | ORAL | Status: DC
  Administered 2016-05-03 – 2016-05-16 (×14): 81 mg via ORAL
  Filled 2016-05-02 (×14): qty 1

## 2016-05-02 MED ORDER — SODIUM CHLORIDE 0.9 % IV SOLN
Freq: Once | INTRAVENOUS | Status: AC
  Administered 2016-05-02: 18:00:00 via INTRAVENOUS

## 2016-05-02 MED ORDER — TECHNETIUM TO 99M ALBUMIN AGGREGATED
4.0000 | Freq: Once | INTRAVENOUS | Status: AC | PRN
  Administered 2016-05-02: 4 via INTRAVENOUS

## 2016-05-02 MED ORDER — INSULIN GLARGINE 100 UNIT/ML ~~LOC~~ SOLN
50.0000 [IU] | SUBCUTANEOUS | Status: DC
  Administered 2016-05-02 – 2016-05-10 (×9): 50 [IU] via SUBCUTANEOUS
  Filled 2016-05-02 (×12): qty 0.5

## 2016-05-02 MED ORDER — TECHNETIUM TC 99M DIETHYLENETRIAME-PENTAACETIC ACID: 30.0000 | Freq: Once | INTRAVENOUS | Status: DC | PRN

## 2016-05-02 MED ORDER — PANTOPRAZOLE SODIUM 40 MG PO TBEC
40.0000 mg | DELAYED_RELEASE_TABLET | Freq: Every day | ORAL | Status: DC
  Administered 2016-05-03 – 2016-05-07 (×5): 40 mg via ORAL
  Filled 2016-05-02 (×5): qty 1

## 2016-05-02 MED ORDER — ISOSORBIDE MONONITRATE ER 30 MG PO TB24
30.0000 mg | ORAL_TABLET | Freq: Every day | ORAL | Status: DC
  Administered 2016-05-03 – 2016-05-16 (×14): 30 mg via ORAL
  Filled 2016-05-02 (×14): qty 1

## 2016-05-02 NOTE — ED Notes (Addendum)
Report accepted by Claiborne Billings, RN. She is made aware of glucose result

## 2016-05-02 NOTE — ED Triage Notes (Signed)
PT arrives via EMS from son's home. PT has had cold symptoms for 1 week. PT reports increasing SOB. PT's SOB was worse this morning and accompanied by significant generalized weakness. PT audibly wheezing.   PT has history of afib. PT was 94% on room air when EMS arrived, he improved to 100% on room air in transit after duoneb and 125 mg solu medrol IV

## 2016-05-02 NOTE — H&P (Signed)
Patient seen by me. Full history and physical exam completed. Vital signs, imaging and lab results reviewed. Patient already discussed with the resident. I will cosign resident's note once completed.

## 2016-05-02 NOTE — ED Notes (Signed)
Patient transported to X-ray 

## 2016-05-02 NOTE — ED Notes (Signed)
Accepting nurse is in contact room requests to call back

## 2016-05-02 NOTE — ED Notes (Signed)
PT returns from Xray 

## 2016-05-02 NOTE — Progress Notes (Signed)
Paged family medicine teaching services because patient bed time CBG was 301. Feng,MD returned page and placed one time order for 7 units of novolog to be given. Will continue to monitor and treat per MD orders.
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
## 2016-05-02 NOTE — ED Notes (Signed)
MD at bedside. 
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
## 2016-05-03 ENCOUNTER — Inpatient Hospital Stay (HOSPITAL_COMMUNITY): Payer: Medicare Other

## 2016-05-03 DIAGNOSIS — R609 Edema, unspecified: Secondary | ICD-10-CM

## 2016-05-03 LAB — CBC
HEMATOCRIT: 27.5 % — AB (ref 39.0–52.0)
Hemoglobin: 9 g/dL — ABNORMAL LOW (ref 13.0–17.0)
MCH: 28.3 pg (ref 26.0–34.0)
MCHC: 32.7 g/dL (ref 30.0–36.0)
MCV: 86.5 fL (ref 78.0–100.0)
Platelets: 219 10*3/uL (ref 150–400)
RBC: 3.18 MIL/uL — ABNORMAL LOW (ref 4.22–5.81)
RDW: 15.3 % (ref 11.5–15.5)
WBC: 7 10*3/uL (ref 4.0–10.5)

## 2016-05-03 LAB — GLUCOSE, CAPILLARY
GLUCOSE-CAPILLARY: 316 mg/dL — AB (ref 65–99)
GLUCOSE-CAPILLARY: 169 mg/dL — AB (ref 65–99)
GLUCOSE-CAPILLARY: 111 mg/dL — AB (ref 65–99)
Glucose-Capillary: 330 mg/dL — ABNORMAL HIGH (ref 65–99)

## 2016-05-03 LAB — BASIC METABOLIC PANEL
Anion gap: 12 (ref 5–15)
BUN: 51 mg/dL — AB (ref 6–20)
CHLORIDE: 95 mmol/L — AB (ref 101–111)
CO2: 22 mmol/L (ref 22–32)
Calcium: 9.1 mg/dL (ref 8.9–10.3)
Creatinine, Ser: 2.63 mg/dL — ABNORMAL HIGH (ref 0.61–1.24)
GFR calc Af Amer: 24 mL/min — ABNORMAL LOW (ref >60)
GFR calc non Af Amer: 20 mL/min — ABNORMAL LOW (ref >60)
GLUCOSE: 270 mg/dL — AB (ref 65–99)
POTASSIUM: 4.5 mmol/L (ref 3.5–5.1)
SODIUM: 129 mmol/L — AB (ref 135–145)

## 2016-05-03 LAB — HEMOGLOBIN A1C
HEMOGLOBIN A1C: 6.6 % — AB (ref 4.8–5.6)
Mean Plasma Glucose: 143 mg/dL

## 2016-05-03 LAB — HIV ANTIBODY (ROUTINE TESTING W REFLEX): HIV Screen 4th Generation wRfx: NONREACTIVE

## 2016-05-03 MED ORDER — FUROSEMIDE 10 MG/ML IJ SOLN
40.0000 mg | Freq: Once | INTRAMUSCULAR | Status: AC
  Administered 2016-05-03: 40 mg via INTRAVENOUS
  Filled 2016-05-03: qty 4

## 2016-05-03 MED ORDER — INSULIN ASPART 100 UNIT/ML ~~LOC~~ SOLN
0.0000 [IU] | Freq: Three times a day (TID) | SUBCUTANEOUS | Status: DC
  Administered 2016-05-03: 3 [IU] via SUBCUTANEOUS
  Administered 2016-05-04 – 2016-05-05 (×3): 8 [IU] via SUBCUTANEOUS
  Administered 2016-05-05: 3 [IU] via SUBCUTANEOUS
  Administered 2016-05-05: 2 [IU] via SUBCUTANEOUS
  Administered 2016-05-06: 15 [IU] via SUBCUTANEOUS
  Administered 2016-05-06 – 2016-05-07 (×3): 8 [IU] via SUBCUTANEOUS

## 2016-05-03 MED ORDER — FUROSEMIDE 10 MG/ML IJ SOLN
20.0000 mg | Freq: Once | INTRAMUSCULAR | Status: AC
  Administered 2016-05-03: 20 mg via INTRAVENOUS
  Filled 2016-05-03: qty 2

## 2016-05-03 MED ORDER — INSULIN ASPART 100 UNIT/ML ~~LOC~~ SOLN
0.0000 [IU] | Freq: Every day | SUBCUTANEOUS | Status: DC
  Administered 2016-05-06: 5 [IU] via SUBCUTANEOUS
  Administered 2016-05-07: 3 [IU] via SUBCUTANEOUS
  Administered 2016-05-09 – 2016-05-10 (×2): 2 [IU] via SUBCUTANEOUS
  Administered 2016-05-12 – 2016-05-13 (×2): 3 [IU] via SUBCUTANEOUS
  Administered 2016-05-14: 2 [IU] via SUBCUTANEOUS

## 2016-05-03 NOTE — Progress Notes (Signed)
Paged Family medicine teaching services about patient being unable to breathe when lying flat in the bed. Pt oxygen saturation lying down was 95%. Pt refused to stay in the bed, and wanted to sit in the chair. Patient now sitting in the chair with chair alarm in place. Patient states he is now able to breathe better. Oxygen saturation when sitting in the chair was 96%. On auscultation, lung sounds were clear, but somewhat diminished with no wheezing or rhonchi. Feng,MD and McKeag,MD came to floor and gave RN verbal order with read back for one time dose of 20mg  of IV lasix. McKeag,MD also requested PO dose of 40mg  of lasix be rescheduled from 10AM to 12PM. Orders placed by RN per verbal order with read back. Will continue to monitor and treat per MD orders.

## 2016-05-03 NOTE — Progress Notes (Signed)
Family Medicine Teaching Service Daily Progress Note Intern Pager: [phone]  Patient [name] Medical record number: [id number] Date of birth: 17-Sep-1927 Age: 82 y.o. Gender: male  Primary Care Provider: [name], MD Consultants: none Code Status: DNR/DNI  Pt Overview and Major Events to Date:  11/23 - admit to [facility] for SOB and weakness  Assessment and Plan: [name] is a 82 y.o. male presenting with weakness and shortness of breath in setting of recent URI. PMH is significant for CHF, HTN, CAD, hx of CVA with residual weakness, T2DM, anemia in the setting of angiodysplasia of stomach, history of prostate cancer  Shortness of breath 2/2 CAP and CHF  Afebrile, no leukocytosis. CXR with evidence of pulmonary edema and interstitial infiltrates, left > right. Patient with extensive history of heart disease and HFpEF, likely some component of fluid overload contributing. s/p 1 dose IV Lasix 40mg  yesterday with UOP 1300cc.  VQ scan neg for PE. On PT eval, desat to 87% on RA with minimal activity and reported DOE. Weight down 2 lbs since admission. -continuous cardiac monitoring -continuous pulse ox, supplemental oxygen as needed -continue Duonebs q6 hours, IS -bilateral LE dopplers - preliminary read with no DVT -continue Rocephin and azithromycin for suspected pneumonia -increase IV Lasix to 40mg  BID, f/u diuresis  -PT/OT evals - HHPT, 24h supervision -daily weights and I&Os  Anemia - chronically anemic 2/2 iron def 2/2 recent GI bleed 2/2 angiodysplasia of stomach, as well as anemia of CKD. Frequently gets blood transfusions as well as Procit. Followed by heme. Retic count appropriate at 45.6 -continue home iron supplementation -Threshold for transfusion 8. S/p 1u pRBCs 11/23 with stable hgb of 9.0  -CBC in morning  AKI on CKD3 - Baseline appears to be ~2.10, on admission creatinine 2.44 >2.63 >2.5. Likely related to volume overload -continue Lasix as  above -avoid nephrotoxic medications -monitor daily  Chronic Hyponatremia- likely contributing to weakness. Na 133 (at baseline). Likely related to volume overload in setting of CHF -fluid restrict and diurese as above -Continue to monitor  A fib- currently in a fib, rate controlled, on coreg at home. Takes aspirin daily. -continue Aspirin 81mg  daily -monitor on telemetry  Type 2 Diabetes-  A1C 6.6. Takes 50 units Lantus every morning and 3-7 units of Novolog TID  -continue home lantus -moderate sliding scale - consider home SSI scale as patient is very concerned about CBGs  -CBG AC/HS  HTN- on coreg and norvasc at home. Controlled -continue home medications  Constipation- Resolved.  - Continue colace and miralax  Depression- stable, takes Lexapro 10mg  daily -continue home medication  FEN/GI: carb modified/heart healthy diet, protonix  Prophylaxis: lovenox  Disposition: admitted, d/c pending improvement in SOB and volume status  Subjective:  Patient reports that he had a rough night with worsening cough.  Cough is non-productive but sounds wet and crackly.  He continues to sleep sitting upright in chair.  Objective: Temp:  [97.9 F (36.6 C)-98.4 F (36.9 C)] 98.4 F (36.9 C) (11/25 0435) Pulse Rate:  [60-74] 60 (11/25 0435) Resp:  [18-20] 20 (11/25 0435) BP: (114-143)/(44-67) 143/55 (11/25 0435) SpO2:  [93 %-100 %] 94 % (11/25 0435) Weight:  [251 lb (113.9 kg)] 251 lb (113.9 kg) (11/25 0303) Physical Exam: General: pleasant elderly, chronically ill appearing man. In no acute distress. HEENT: NCAT, PERRL, MMM Cardiovascular: irregular rhythm, normal rate, faint systolic murmur Respiratory: crackles bilaterally with expiratory wheezes in all lung fields, diminished breath sounds in b/l bases,  no increased work of breathing Gastrointestinal: soft, non-tender, non-distended, +BS MSK: left calf/thigh swollen and tender > right, moves all extremities equally;  bilateral capillary refill and pulses intact  Psych: normal mood and affect  Laboratory:  Recent Labs Lab 05/02/16 1429 05/03/16 0215 05/04/16 0352  WBC 6.8 7.0 7.7  HGB 8.0* 9.0* 9.0*  HCT 25.0* 27.5* 27.7*  PLT 205 219 245    Recent Labs Lab 05/02/16 0926 05/02/16 1429 05/03/16 0215 05/04/16 0352  NA 128*  --  129* 133*  K 4.5  --  4.5 3.8  CL 93*  --  95* 97*  CO2 24  --  22 25  BUN 46*  --  51* 57*  CREATININE 2.44* 2.50* 2.63* 2.50*  CALCIUM 8.9  --  9.1 9.2  PROT 6.7  --   --   --   BILITOT 0.7  --   --   --   ALKPHOS 108  --   --   --   ALT 19  --   --   --   AST 34  --   --   --   GLUCOSE 165*  --  270* 58*    Hgb A1c 6.6  Imaging/Diagnostic Tests: Dg Chest 2 View  Result Date: 05/02/2016 CLINICAL DATA:  Dry cough.  History of hypertension and diabetes. EXAM: CHEST  2 VIEW COMPARISON:  03/22/2016; 07/10/2015; 06/27/2014 FINDINGS: Grossly unchanged enlarged cardiac silhouette and mediastinal contours given slightly reduced lung volumes. Post median sternotomy. The pulmonary vasculature appears less distinct than present examination with cephalization of flow. Worsening bibasilar opacities, left greater than right. Interval development of small bilateral effusions, left greater than right. No pneumothorax. No acute osseus abnormalities. IMPRESSION: Findings most suggestive of pulmonary edema with small bilateral effusions and associated bibasilar opacities, left greater than right, atelectasis versus infiltrate. Electronically Signed   By: Sandi Mariscal M.D.   On: 05/02/2016 10:28   Nm Pulmonary Perf And Vent  Result Date: 05/02/2016 CLINICAL DATA:  82 year old male with shortness of breath for 5 days. EXAM: NUCLEAR MEDICINE VENTILATION - PERFUSION LUNG SCAN TECHNIQUE: Ventilation images were obtained in multiple projections using inhaled aerosol Tc-72m DTPA. Perfusion images were obtained in multiple projections after intravenous injection of Tc-26m MAA.  RADIOPHARMACEUTICALS:  30.3 mCi Technetium-74m DTPA aerosol inhalation and 4.0 mCi Technetium-54m MAA IV COMPARISON:  05/02/2016 chest radiograph FINDINGS: Ventilation: No focal ventilation defect. Perfusion: No wedge shaped peripheral perfusion defects to suggest acute pulmonary embolism. No perfusion-ventilation mismatches identified. IMPRESSION: Unremarkable study - no evidence of pulmonary emboli. Electronically Signed   By: Margarette Canada M.D.   On: 05/02/2016 19:50     Virginia Crews, MD 05/04/2016, 7:40 AM PGY-3, [place] Intern pager: [phone], text pages welcome

## 2016-05-03 NOTE — Evaluation (Signed)
Occupational Therapy Evaluation Patient Details [name] MRN: [id number] DOB: 01/28/1928 Today's Date: 05/03/2016    History of Present Illness 82 y.o. male presenting with weakness and shortness of breath in setting of recent URI. PMH is significant for CHF, HTN, CAD, hx of CVA with residual weakness, T2DM, anemia in the setting of angiodysplasia of stomach, history of prostate cancer   Clinical Impression   Pt reports he required assist with bathing from PCA PTA. Currently pt overall min guard for functional mobility and ADL with the exception of min assist for LB ADL. Pt with increased SOB during activity with SpO2 down to 87% on RA with activity. Pt planning to d/c home with intermittent support from family/PCA. Pt would benefit from continued skilled OT to address established goals.    Follow Up Recommendations  No OT follow up;Supervision - Intermittent    Equipment Recommendations  None recommended by OT    Recommendations for Other Services       Precautions / Restrictions Precautions Precautions: Fall Precaution Comments: watch O2 saturations Restrictions Weight Bearing Restrictions: No      Mobility Bed Mobility               General bed mobility comments: received in chair  Transfers Overall transfer level: Needs assistance Equipment used: Rolling walker (2 wheeled) Transfers: Sit to/from Stand Sit to Stand: Min guard         General transfer comment: min guard for stability, no physical assist required    Balance Overall balance assessment: Needs assistance Sitting-balance support: Feet supported;No upper extremity supported Sitting balance-[name]: Good     Standing balance support: During functional activity Standing balance-[name]: Fair Standing balance comment: with some instbaility requiring modest self support at times due to lean             High level balance activites: Direction changes;Turns High Level  Balance Comments: min guard for stability            ADL Overall ADL's : Needs assistance/impaired Eating/Feeding: Set up;Sitting Eating/Feeding Details (indicate cue type and reason): Difficulty with opening packages and fine motor activities Grooming: Min guard;Standing;Wash/dry hands;Wash/dry face;Oral care   Upper Body Bathing: Set up;Min guard;Sitting   Lower Body Bathing: Minimal assistance;Sit to/from stand   Upper Body Dressing : Set up;Supervision/safety;Sitting Upper Body Dressing Details (indicate cue type and reason): to don hospital gown Lower Body Dressing: Minimal assistance;Sit to/from stand Lower Body Dressing Details (indicate cue type and reason): Pt with difficult getting down to feet to adjust socks. Pt reports he uses a stool at home for independence with LB ADL. Toilet Transfer: Min guard;Ambulation;Regular Toilet;RW Toilet Transfer Details (indicate cue type and reason): Simulated by sit to stand from chair with functional mobility in room.         Functional mobility during ADLs: Min guard;Rolling walker General ADL Comments: Pt with increased SOB during functional activity; SpO2 down to 87% on RA following activity.     Vision Additional Comments: Appears WFL.   Perception     Praxis      Pertinent Vitals/Pain Pain Assessment: No/denies pain     Hand Dominance Right   Extremity/Trunk Assessment Upper Extremity Assessment Upper Extremity Assessment: Generalized weakness;LUE deficits/detail LUE Coordination: decreased fine motor   Lower Extremity Assessment Lower Extremity Assessment: Defer to PT evaluation LLE Deficits / Details: patient with some residual weakness and sesnory deficits from prior CVA.  LLE Coordination: decreased fine motor   Cervical /  Trunk Assessment Cervical / Trunk Assessment: Kyphotic   Communication Communication Communication: HOH   Cognition Arousal/Alertness: Awake/alert Behavior During Therapy: WFL for  tasks assessed/performed Overall Cognitive Status: Within Functional Limits for tasks assessed                     General Comments       Exercises       Shoulder Instructions      Home Living Family/patient expects to be discharged to:: Private residence Living Arrangements: Alone Available Help at Discharge: Family;Friend(s) (has in home careg-gver services 20hrs per week. ) Type of Home: House Home Access: Stairs to enter [employer] of Steps: Garage: 2 with 2 railings Entrance Stairs-Rails: Right;Left Home Layout: Two level;Bed/bath upstairs Alternate Level Stairs-Number of Steps: flight (16) with landing (8+8) Alternate Level Stairs-Rails: Left Bathroom Shower/Tub: Tub/shower unit;Walk-in shower;Door;Curtain   Bathroom Toilet: Handicapped height Bathroom Accessibility: Yes   Home Equipment: Environmental consultant - 2 wheels;Walker - 4 wheels   Additional Comments: Pt has grab bars for toilet and shower. Pt has walk in shower and tub/shower combo. He owns shower chair.        Prior Functioning/Environment Level of [place]: Needs assistance  Gait / Transfers Assistance Needed: RW/Rollator for AMB ADL's / Homemaking Assistance Needed: Min-ModAssist with ADL/IADL for safety/set-up/supervision.    Comments: son is MD; was a Insurance underwriter. PCA comes every afternoon for a few hours- takes him to appt and to exercise class twice/week. PCA assists as needed with bathing/dressing, 5 days per week, 4 hrs per day.        OT Problem List: Decreased activity tolerance;Impaired balance (sitting and/or standing);Decreased knowledge of use of DME or AE;Obesity;Impaired UE functional use   OT Treatment/Interventions: Self-care/ADL training;Energy conservation;DME and/or AE instruction;Therapeutic activities;Patient/family education;Balance training    OT Goals(Current goals can be found in the care plan section) Acute Rehab OT Goals Patient Stated Goal: to get stronger OT Goal  Formulation: With patient Time For Goal Achievement: 05/17/16 Potential to Achieve Goals: Good ADL Goals Pt Will Perform Upper Body Bathing: with supervision;sitting Pt Will Perform Lower Body Bathing: with supervision;sit to/from stand Pt Will Transfer to Toilet: with modified independence;ambulating;regular height toilet Pt Will Perform Toileting - Clothing Manipulation and hygiene: with modified independence;sit to/from stand Additional ADL Goal #1: Pt will independently verbally recall 3 energy conservation strategies.  OT Frequency: Min 2X/week   Barriers to D/C:            Co-evaluation PT/OT/SLP Co-Evaluation/Treatment: Yes Reason for Co-Treatment: For patient/therapist safety PT goals addressed during session: Mobility/safety with mobility OT goals addressed during session: ADL's and self-care      End of Session Equipment Utilized During Treatment: Rolling walker  Activity Tolerance: Patient tolerated treatment well Patient left: in chair;with call bell/phone within reach;with chair alarm set   Time: [id number] OT Time Calculation (min): 20 min Charges:  OT General Charges $OT Visit: 1 Procedure OT Evaluation $OT Eval Moderate Complexity: 1 Procedure G-Codes:     Binnie Kand  M.S., OTR/L Pager: [phone]  05/03/2016, 9:17 AM

## 2016-05-03 NOTE — Evaluation (Signed)
Physical Therapy Evaluation Patient Details [name] MRN: [id number] DOB: Mar 08, 1928 Today's Date: 05/03/2016   History of Present Illness  82 y.o. male presenting with weakness and shortness of breath in setting of recent URI. PMH is significant for CHF, HTN, CAD, hx of CVA with residual weakness, T2DM, anemia in the setting of angiodysplasia of stomach, history of prostate cancer  Clinical Impression  Patient demonstrates deficits in functional mobility as indicated below. Will benefit from continued skilled PT to address deficits and maximize function. Will see as indicated and progress as tolerated. Recommend resume HHPT upon acute discharge.  OF NOTE: Activity on room air with initial saturations at 95% dropped to 87% with minimal activity and reported DOE.    Follow Up Recommendations Home health PT;Supervision/Assistance - 24 hour    Equipment Recommendations  None recommended by PT    Recommendations for Other Services       Precautions / Restrictions Precautions Precautions: Fall Precaution Comments: watch O2 saturations Restrictions Weight Bearing Restrictions: No      Mobility  Bed Mobility               General bed mobility comments: received in chair  Transfers Overall transfer level: Needs assistance Equipment used: Rolling walker (2 wheeled) Transfers: Sit to/from Stand Sit to Stand: Min guard         General transfer comment: min guard for stability, no physical assist required  Ambulation/Gait Ambulation/Gait assistance: Min guard Ambulation Distance (Feet): 40 Feet Assistive device: Rolling walker (2 wheeled) Gait Pattern/deviations: Step-through pattern;Decreased stride length;Trunk flexed Gait velocity: decreased Gait velocity interpretation: Below normal speed for age/gender General Gait Details: steady with ambulation, but fatigues quickly with limited activity tolerance  Stairs            Wheelchair Mobility     Modified Rankin (Stroke Patients Only)       Balance Overall balance assessment: Needs assistance   Sitting balance-[name]: Good     Standing balance support: During functional activity Standing balance-[name]: Fair Standing balance comment: with some instbaility requiring modest self support at times due to lean             High level balance activites: Direction changes;Turns High Level Balance Comments: min guard for stability             Pertinent Vitals/Pain Pain Assessment: No/denies pain    Home Living Family/patient expects to be discharged to:: Private residence Living Arrangements: Alone Available Help at Discharge: Family;Friend(s) (has in home careg-gver services 20hrs per week. ) Type of Home: House Home Access: Stairs to enter Entrance Stairs-Rails: Psychiatric nurse of Steps: Garage: 2 with 2 railings Home Layout: Two level;Bed/bath upstairs Home Equipment: Walker - 2 wheels;Walker - 4 wheels Additional Comments: Pt has grab bars for toilet and shower. Pt has walk in shower and tub/shower combo. He owns shower chair.      Prior Function Level of [place]: Needs assistance   Gait / Transfers Assistance Needed: RW/Rollator for AMB  ADL's / Homemaking Assistance Needed: Min-ModAssist with ADL/IADL for safety/set-up/supervision.   Comments: son is MD; was a Insurance underwriter. PCA comes every afternoon for a few hours- takes him to appt and to exercise class twice/week. PCA assists as needed with bathing/dressing     Hand Dominance   Dominant Hand: Right    Extremity/Trunk Assessment   Upper Extremity Assessment: Generalized weakness;LUE deficits/detail           Lower Extremity Assessment: Generalized weakness;LLE  deficits/detail   LLE Deficits / Details: patient with some residual weakness and sesnory deficits from prior CVA.      Communication   Communication: HOH  Cognition Arousal/Alertness:  Awake/alert Behavior During Therapy: WFL for tasks assessed/performed Overall Cognitive Status: Within Functional Limits for tasks assessed                      General Comments      Exercises     Assessment/Plan    PT Assessment Patient needs continued PT services  PT Problem List Decreased strength;Decreased activity tolerance;Decreased balance;Decreased mobility;Cardiopulmonary status limiting activity;Obesity          PT Treatment Interventions DME instruction;Gait training;Functional mobility training;Therapeutic activities;Therapeutic exercise;Balance training;Patient/family education    PT Goals (Current goals can be found in the Care Plan section)  Acute Rehab PT Goals Patient Stated Goal: to get stronger PT Goal Formulation: With patient Time For Goal Achievement: 05/17/16 Potential to Achieve Goals: Good    Frequency Min 3X/week   Barriers to discharge        Co-evaluation PT/OT/SLP Co-Evaluation/Treatment: Yes Reason for Co-Treatment: For patient/therapist safety PT goals addressed during session: Mobility/safety with mobility         End of Session   Activity Tolerance: Patient limited by fatigue Patient left: in chair;with call bell/phone within reach;with chair alarm set Nurse Communication: Mobility status         Time: [id number] PT Time Calculation (min) (ACUTE ONLY): 23 min   Charges:   PT Evaluation $PT Eval Moderate Complexity: 1 Procedure     PT G Codes:        Duncan Dull 06/01/16, 9:11 AM Alben Deeds, PT DPT  [phone]

## 2016-05-03 NOTE — Progress Notes (Signed)
Family Medicine Teaching Service Daily Progress Note Intern Pager: [phone]  Patient [name] Medical record number: [id number] Date of birth: 09/21/1927 Age: 82 y.o. Gender: male  Primary Care Provider: [name], MD Consultants: none Code Status: DNR/DNI  Pt Overview and Major Events to Date:  11/23 - admit to [facility] for SOB and weakness  Assessment and Plan: [name] is a 82 y.o. male presenting with weakness and shortness of breath in setting of recent URI. PMH is significant for CHF, HTN, CAD, hx of CVA with residual weakness, T2DM, anemia in the setting of angiodysplasia of stomach, history of prostate cancer  Shortness of breath 2/2 CAP and CHF  Afebrile, no leukocytosis. Not meeting SIRS criteria. CXR with evidence of pulmonary edema and interstitial infiltrates, left > right, concerning for pneumonia vs. atelectasis. Patient with extensive history of heart disease and HFpEF, likely some component of fluid overload contributing. s/p 1 dose IV Lasix 20mg  O/N for worsening SOB with lying flat.  VQ scan neg for PE -continuous cardiac monitoring -continuous pulse ox, supplemental oxygen as needed -continue Duonebs q6 hours -incentive spirometry -bilateral LE dopplers pending -continue Rocephin and azithromycin for suspected pneumonia -IV Lasix 40mg  x1 this AM, f/u diuresis  -PT/OT evaluation -daily weights and I&Os - repeat Echo  Anemia - chronically anemic 2/2 iron def 2/2 recent GI bleed 2/2 angiodysplasia of stomach, as well as anemia of CKD. Frequently gets blood transfusions as well as Procit. Followed by heme. Retic count appropriate at 45.6 -continue home iron supplementation -Threshold for transfusion 8. S/p 1u pRBCs O/N with post-transfusion hgb 9.0 -CBC in morning  AKI on CKD3 - Baseline appears to be ~2.10, on admission creatinine 2.44 > now 2.63. Likely related to volume overload -continue Lasix as above -avoid nephrotoxic  medications -monitor daily  Chronic Hyponatremia- likely contributing to weakness. Na 129 (around baseline). Likely related to volume overload in setting of CHF -fluid restrict and diurese as above -Continue to monitor  A fib- currently in a fib, rate controlled, on coreg at home. Takes aspirin daily. -continue Aspirin 81mg  daily -monitor on telemetry  Type 2 Diabetes-  A1C 6.6. Takes 50 units Lantus every morning and 3-7 units of Novolog TID  -continue home lantus -sliding scale - increase to moderate  -CBG AC/HS  HTN- on coreg and norvasc at home. Controlled -continue home medications  Constipation- Resolved.  - Continue colace and miralax  Depression- stable, takes Lexapro 10mg  daily -continue home medication  FEN/GI: carb modified/heart healthy diet, protonix  Prophylaxis: lovenox  Disposition: admitted, d/c pending improvement in SOB and volume status  Subjective:  Patient reports that he is feeling much better today.  He was acutely more SOB overnight when laying flat in bed.  He typically sleeps flat on 2 pillows.  Once he was sitting up in a chair, he was able to breathe more comfortably  Objective: Temp:  [97.5 F (36.4 C)-97.8 F (36.6 C)] 97.7 F (36.5 C) (11/24 0454) Pulse Rate:  [71-86] 71 (11/24 0454) Resp:  [11-22] 20 (11/24 0454) BP: (127-154)/(56-91) 127/91 (11/24 0454) SpO2:  [91 %-100 %] 94 % (11/24 0839) Weight:  [243 lb (110.2 kg)-253 lb 15.5 oz (115.2 kg)] 253 lb 15.5 oz (115.2 kg) (11/23 1405) Physical Exam: General: pleasant elderly, chronically ill appearing man. In no acute distress. HEENT: NCAT, PERRL, MMM Cardiovascular: irregular rhythm, normal rate, faint systolic murmur Respiratory: coarse breath sounds bilaterally with expiratory wheezes in all lung fields, diminished breath sounds in  b/l bases, no increased work of breathing Gastrointestinal: soft, non-tender, non-distended, +BS MSK: left calf/thigh swollen and tender > right,  moves all extremities equally; bilateral capillary refill and pulses intact  Psych: normal mood and affect  Laboratory:  Recent Labs Lab 05/02/16 0926 05/02/16 1429 05/03/16 0215  WBC 8.6 6.8 7.0  HGB 8.1* 8.0* 9.0*  HCT 26.0* 25.0* 27.5*  PLT 194 205 219    Recent Labs Lab 05/02/16 0926 05/02/16 1429 05/03/16 0215  NA 128*  --  129*  K 4.5  --  4.5  CL 93*  --  95*  CO2 24  --  22  BUN 46*  --  51*  CREATININE 2.44* 2.50* 2.63*  CALCIUM 8.9  --  9.1  PROT 6.7  --   --   BILITOT 0.7  --   --   ALKPHOS 108  --   --   ALT 19  --   --   AST 34  --   --   GLUCOSE 165*  --  270*    Hgb A1c 6.6  Imaging/Diagnostic Tests: Dg Chest 2 View  Result Date: 05/02/2016 CLINICAL DATA:  Dry cough.  History of hypertension and diabetes. EXAM: CHEST  2 VIEW COMPARISON:  03/22/2016; 07/10/2015; 06/27/2014 FINDINGS: Grossly unchanged enlarged cardiac silhouette and mediastinal contours given slightly reduced lung volumes. Post median sternotomy. The pulmonary vasculature appears less distinct than present examination with cephalization of flow. Worsening bibasilar opacities, left greater than right. Interval development of small bilateral effusions, left greater than right. No pneumothorax. No acute osseus abnormalities. IMPRESSION: Findings most suggestive of pulmonary edema with small bilateral effusions and associated bibasilar opacities, left greater than right, atelectasis versus infiltrate. Electronically Signed   By: Sandi Mariscal M.D.   On: 05/02/2016 10:28   Nm Pulmonary Perf And Vent  Result Date: 05/02/2016 CLINICAL DATA:  82 year old male with shortness of breath for 5 days. EXAM: NUCLEAR MEDICINE VENTILATION - PERFUSION LUNG SCAN TECHNIQUE: Ventilation images were obtained in multiple projections using inhaled aerosol Tc-5m DTPA. Perfusion images were obtained in multiple projections after intravenous injection of Tc-8m MAA. RADIOPHARMACEUTICALS:  30.3 mCi Technetium-28m DTPA  aerosol inhalation and 4.0 mCi Technetium-47m MAA IV COMPARISON:  05/02/2016 chest radiograph FINDINGS: Ventilation: No focal ventilation defect. Perfusion: No wedge shaped peripheral perfusion defects to suggest acute pulmonary embolism. No perfusion-ventilation mismatches identified. IMPRESSION: Unremarkable study - no evidence of pulmonary emboli. Electronically Signed   By: Margarette Canada M.D.   On: 05/02/2016 19:50     Virginia Crews, MD 05/03/2016, 8:47 AM PGY-3, [place] Intern pager: [phone], text pages welcome

## 2016-05-03 NOTE — Progress Notes (Signed)
Inpatient Diabetes Program Recommendations  AACE/ADA: New Consensus Statement on Inpatient Glycemic Control (2015)  Target Ranges:  Prepandial:   less than 140 mg/dL      Peak postprandial:   less than 180 mg/dL (1-2 hours)      Critically ill patients:  140 - 180 mg/dL   Lab Results  Component Value Date   GLUCAP 316 (H) 05/03/2016   HGBA1C 6.6 (H) 05/02/2016    Review of Glycemic Control:  Results for [name], [name] (MRN [id number]) as of 05/03/2016 12:24  Ref. Range 05/02/2016 13:14 05/02/2016 16:55 05/02/2016 21:22 05/03/2016 08:05 05/03/2016 12:12  Glucose-Capillary Latest Ref Range: 65 - 99 mg/dL 350 (H) 418 (H) 301 (H) 330 (H) 316 (H)   Diabetes history: Type 2 diabetes Outpatient Diabetes medications: Lantus 50 units daily, Novolog 3-7 units tid with meals Current orders for Inpatient glycemic control:  Lantus 50 units daily, Novolog sensitive tid with meals  Inpatient Diabetes Program Recommendations:    Please add Novolog meal coverage 4 units tid with meals.   Thanks, Adah Perl, RN, BC-ADM Inpatient Diabetes Coordinator Pager [phone] (8a-5p)

## 2016-05-03 NOTE — Progress Notes (Signed)
Preliminary results by tech - Venous Duplex Lower Ext. Completed. No evidence of acute deep or superficial vein thrombosis in both legs.  Oda Cogan, BS, RDMS, RVT

## 2016-05-04 DIAGNOSIS — I350 Nonrheumatic aortic (valve) stenosis: Secondary | ICD-10-CM

## 2016-05-04 DIAGNOSIS — I5043 Acute on chronic combined systolic (congestive) and diastolic (congestive) heart failure: Secondary | ICD-10-CM

## 2016-05-04 LAB — LEGIONELLA PNEUMOPHILA SEROGP 1 UR AG: L. pneumophila Serogp 1 Ur Ag: NEGATIVE

## 2016-05-04 LAB — CBC
HCT: 27.7 % — ABNORMAL LOW (ref 39.0–52.0)
Hemoglobin: 9 g/dL — ABNORMAL LOW (ref 13.0–17.0)
MCH: 28.3 pg (ref 26.0–34.0)
MCHC: 32.5 g/dL (ref 30.0–36.0)
MCV: 87.1 fL (ref 78.0–100.0)
PLATELETS: 245 10*3/uL (ref 150–400)
RBC: 3.18 MIL/uL — AB (ref 4.22–5.81)
RDW: 15.8 % — AB (ref 11.5–15.5)
WBC: 7.7 10*3/uL (ref 4.0–10.5)

## 2016-05-04 LAB — BASIC METABOLIC PANEL
Anion gap: 11 (ref 5–15)
BUN: 57 mg/dL — AB (ref 6–20)
CALCIUM: 9.2 mg/dL (ref 8.9–10.3)
CHLORIDE: 97 mmol/L — AB (ref 101–111)
CO2: 25 mmol/L (ref 22–32)
CREATININE: 2.5 mg/dL — AB (ref 0.61–1.24)
GFR, EST AFRICAN AMERICAN: 25 mL/min — AB (ref >60)
GFR, EST NON AFRICAN AMERICAN: 22 mL/min — AB (ref >60)
Glucose, Bld: 58 mg/dL — ABNORMAL LOW (ref 65–99)
Potassium: 3.8 mmol/L (ref 3.5–5.1)
SODIUM: 133 mmol/L — AB (ref 135–145)

## 2016-05-04 LAB — GLUCOSE, CAPILLARY
GLUCOSE-CAPILLARY: 99 mg/dL (ref 65–99)
GLUCOSE-CAPILLARY: 269 mg/dL — AB (ref 65–99)
GLUCOSE-CAPILLARY: 253 mg/dL — AB (ref 65–99)
GLUCOSE-CAPILLARY: 125 mg/dL — AB (ref 65–99)

## 2016-05-04 MED ORDER — IPRATROPIUM-ALBUTEROL 0.5-2.5 (3) MG/3ML IN SOLN
3.0000 mL | Freq: Two times a day (BID) | RESPIRATORY_TRACT | Status: DC
  Administered 2016-05-04 – 2016-05-05 (×2): 3 mL via RESPIRATORY_TRACT
  Filled 2016-05-04 (×2): qty 3

## 2016-05-04 MED ORDER — FUROSEMIDE 10 MG/ML IJ SOLN
40.0000 mg | Freq: Two times a day (BID) | INTRAMUSCULAR | Status: AC
  Administered 2016-05-04 (×2): 40 mg via INTRAVENOUS
  Filled 2016-05-04 (×2): qty 4

## 2016-05-04 NOTE — Progress Notes (Signed)
PTS SON DR NASHER HAS STATED THAT HE WOULD PREFER THE PT TO GO TO A SNF FOR REHAB AND STRENGTHENING.

## 2016-05-05 ENCOUNTER — Inpatient Hospital Stay (HOSPITAL_COMMUNITY): Payer: Medicare Other

## 2016-05-05 DIAGNOSIS — I509 Heart failure, unspecified: Secondary | ICD-10-CM

## 2016-05-05 DIAGNOSIS — B999 Unspecified infectious disease: Secondary | ICD-10-CM

## 2016-05-05 DIAGNOSIS — J17 Pneumonia in diseases classified elsewhere: Secondary | ICD-10-CM

## 2016-05-05 LAB — BASIC METABOLIC PANEL
ANION GAP: 10 (ref 5–15)
BUN: 55 mg/dL — ABNORMAL HIGH (ref 6–20)
CO2: 26 mmol/L (ref 22–32)
Calcium: 9 mg/dL (ref 8.9–10.3)
Chloride: 95 mmol/L — ABNORMAL LOW (ref 101–111)
Creatinine, Ser: 2.4 mg/dL — ABNORMAL HIGH (ref 0.61–1.24)
GFR calc Af Amer: 27 mL/min — ABNORMAL LOW (ref >60)
GFR, EST NON AFRICAN AMERICAN: 23 mL/min — AB (ref >60)
GLUCOSE: 51 mg/dL — AB (ref 65–99)
POTASSIUM: 3.7 mmol/L (ref 3.5–5.1)
Sodium: 131 mmol/L — ABNORMAL LOW (ref 135–145)

## 2016-05-05 LAB — GLUCOSE, CAPILLARY
GLUCOSE-CAPILLARY: 278 mg/dL — AB (ref 65–99)
GLUCOSE-CAPILLARY: 154 mg/dL — AB (ref 65–99)
GLUCOSE-CAPILLARY: 153 mg/dL — AB (ref 65–99)
GLUCOSE-CAPILLARY: 85 mg/dL (ref 65–99)
Glucose-Capillary: 138 mg/dL — ABNORMAL HIGH (ref 65–99)

## 2016-05-05 LAB — CBC
HCT: 28.7 % — ABNORMAL LOW (ref 39.0–52.0)
Hemoglobin: 9.2 g/dL — ABNORMAL LOW (ref 13.0–17.0)
MCH: 27.6 pg (ref 26.0–34.0)
MCHC: 32.1 g/dL (ref 30.0–36.0)
MCV: 86.2 fL (ref 78.0–100.0)
PLATELETS: 252 10*3/uL (ref 150–400)
RBC: 3.33 MIL/uL — AB (ref 4.22–5.81)
RDW: 15.1 % (ref 11.5–15.5)
WBC: 7.7 10*3/uL (ref 4.0–10.5)

## 2016-05-05 LAB — BLOOD GAS, ARTERIAL
Acid-Base Excess: 1.8 mmol/L (ref 0.0–2.0)
Bicarbonate: 26 mmol/L (ref 20.0–28.0)
DRAWN BY: 244851
O2 SAT: 92.6 %
PATIENT TEMPERATURE: 98
pCO2 arterial: 40.8 mmHg (ref 32.0–48.0)
pH, Arterial: 7.418 (ref 7.350–7.450)
pO2, Arterial: 64.6 mmHg — ABNORMAL LOW (ref 83.0–108.0)

## 2016-05-05 MED ORDER — HYDROCODONE-ACETAMINOPHEN 5-325 MG PO TABS: 1.0000 | ORAL_TABLET | Freq: Every day | ORAL | Status: DC | PRN

## 2016-05-05 MED ORDER — FUROSEMIDE 10 MG/ML IJ SOLN
40.0000 mg | Freq: Once | INTRAMUSCULAR | Status: AC
  Administered 2016-05-05: 40 mg via INTRAVENOUS
  Filled 2016-05-05: qty 4

## 2016-05-05 MED ORDER — GUAIFENESIN-DM 100-10 MG/5ML PO SYRP
5.0000 mL | ORAL_SOLUTION | ORAL | Status: DC | PRN
  Administered 2016-05-05 – 2016-05-09 (×7): 5 mL via ORAL
  Filled 2016-05-05 (×8): qty 5

## 2016-05-05 MED ORDER — IPRATROPIUM-ALBUTEROL 0.5-2.5 (3) MG/3ML IN SOLN
3.0000 mL | Freq: Three times a day (TID) | RESPIRATORY_TRACT | Status: DC
  Administered 2016-05-06 – 2016-05-07 (×4): 3 mL via RESPIRATORY_TRACT
  Filled 2016-05-05 (×6): qty 3

## 2016-05-05 MED ORDER — METHYLPREDNISOLONE SODIUM SUCC 125 MG IJ SOLR
125.0000 mg | Freq: Once | INTRAMUSCULAR | Status: AC
  Administered 2016-05-05: 125 mg via INTRAVENOUS
  Filled 2016-05-05: qty 2

## 2016-05-05 NOTE — Progress Notes (Signed)
Dr. Brett Albino contacted me about patient with change in mental status. I went up to patient's room with the resident to reassess him at around 5 pm today. Compare to this morning, he is more sleeping, he denies any concern with his breathing, he is more concern about his right knee pain.  Exam: Gen: Sleepy but easily arousible. Neuro: Oriented x 3. He was able to name his visitors and he knows where he is, just sleepy. Neuro exam otherwise normal. Heart: S1 S2 normal, RRR, no murmur heard. Resp: Air entry reduced bilaterally with mild expiratory wheeze. This is changed from his assessment this morning. Ext: Right knee swollen and tender to touch, no erythema or warmth. Mild LL edema B/L.   Impression:  Change in mental status: R/O Hypercapnia given his respiratory status.  ABG stat ordered, obtain chest xray.  Duoneb treatment.  I agree with IV solumedrol given concern for undiagnosed COPD.  No Crackle on lung exam, given LL edema, may consider dose of lasix. I will defer to after obtaining xray.  Monitor respiratory status while awaiting ABG report. If he becomes more somnolent to obtain CT head.  Right knee pain: Obtaining xray is reasonable. Avoid Opioid for pain.

## 2016-05-05 NOTE — Progress Notes (Signed)
Called by RN to evaluate patient after family expressed concerns that patient was more sleepy than normal. Family also noticed that Pt was a little confused and was occasionally saying things that didn't make sense during conversation.  On my exam, Pt was noted to have increased dyspnea in comparison to this morning. Pt was unable to complete full sentences without becoming short of breath. He had decreased air movement throughout all lung fields, worsened from previous exam. Unable to hear if he is having crackles in his bases. He was also intermittently falling asleep while I was talking to him. O2 sats 99% on room air.  On chart review, he has not received any sedating medications today. He was previously receiving duonebs tid, but these were discontinued this morning. Otherwise, no changes have been made to his medications.  Plan: - Will restart duonebs tid plus q6hrs prn - STAT ABG ordered - Ordered dose of IV Solumedrol 125mg  x 1 - CXR ordered - Will also order right knee x-ray, as Pt is endorsing severe right knee pain. (Pt had LE dopplers that were negative for DVT) - Will re-assess after his duoneb and solumedrol to see if he may benefit from another dose of Lasix. Unable to hear crackles with such poor air movement. - Low threshold to transfer to stepdown.

## 2016-05-05 NOTE — Progress Notes (Signed)
Patient's family concerned that the patient seems to be more lethargic than his baseline. Patient does appear to be more tired than this morning, drifting off during conversation. Patient also asking for Norco for pain which he takes at home. Family medicine resident paged. Resident stated she will come to see the patient. Writer informed family that pain medication will be held until provider arrives to prevent further lethargy and they verbalized understanding. Will continue to monitor. Wyonia Hough

## 2016-05-05 NOTE — Progress Notes (Signed)
Family Medicine Teaching Service Daily Progress Note Intern Pager: [phone]  Patient [name] Medical record number: [id number] Date of birth: 1928-03-01 Age: 82 y.o. Gender: male  Primary Care Provider: [name], MD Consultants: none Code Status: DNR/DNI  Pt Overview and Major Events to Date:  11/23 - admit to [facility] for SOB and weakness  Assessment and Plan: [name] is a 82 y.o. male presenting with weakness and shortness of breath in setting of recent URI. PMH is significant for CHF, HTN, CAD, hx of CVA with residual weakness, T2DM, anemia in the setting of angiodysplasia of stomach, history of prostate cancer  Shortness of breath 2/2 CAP and CHF exacerbation. Afebrile, no leukocytosis. CXR with evidence of pulmonary edema and interstitial infiltrates, left > right. UOP 2.4L. Weight down 4 lbs since admission. On exam, no crackles but still with 1+ pitting edema -Pt has been receiving IV Lasix 40mg  bid. Will give another dose of IV Lasix 40mg  x 1 today and then likely transition to PO Lasix tomorrow. -On Rocephin and Azithro for suspected pneumonia. On PO Azithro. Pt has PCN allergy so cannot transition to Augmentin. Will have to do Omnicef, but we only have the oral suspension here. Pharmacy recommending continuing IV Rocephin while inpatient and completing course with Omnicef as an outpatient. -cardiac monitoring -continuous pulse ox, supplemental oxygen as needed -continue Duonebs q6 hours, IS -daily weights and I&Os -PT- HHPT, 24h supervision -OT- no OT f/u -Son would like for him to go to SNF, CSW consulted.  Normocytic Anemia - chronic anemia w/ hx of iron deficiency, CKD, and recent GI bleed 2/2 angiodysplasia of stomach. S/p 1 unit pRBCs on 11/23. Hgb 8.0 on admission. Stable at 9.2 today. -continue home iron supplementation -Transfusion threshold is 8 -Daily CBCs  AKI on CKD3, improving - Baseline appears to be ~2.10, on admission creatinine  2.44 >2.63 >2.4. -Lasix 40mg  IV x 1 today, as above -avoid nephrotoxic medications -monitor daily  Chronic Hyponatremia- likely contributing to weakness. Na 133 (at baseline). Likely related to volume overload in setting of CHF. Overall improving from 128 on admission > 131 today. -fluid restrict and diurese as above -Continue to monitor  A fib- currently in a fib, rate controlled, on coreg at home. Takes aspirin daily. -continue Aspirin 81mg  daily and Coreg 6.25mg  bid -monitor on telemetry  Type 2 Diabetes-  A1C 6.6. Takes 50 units Lantus every morning and 3-7 units of Novolog TID.  CBGs have been 99-269 in the last 24 hours. -continue home lantus -moderate sliding scale -CBG AC/HS  HTN- on coreg and norvasc at home. Controlled. -continue home medications  Constipation- Resolved.  - Continue colace and miralax  Depression- stable, takes Lexapro 10mg  daily -continue home medication  FEN/GI: carb modified/heart healthy diet, protonix  Prophylaxis: lovenox  Disposition: discharge pending adequate diuresis. Family would like SNF placement, CSW consulted.  Subjective:  Pt states he is doing fine this morning. He denies any chest pain and shortness of breath. He still feels like his feet are swollen and he says this is not typical for him.  Objective: Temp:  [97.8 F (36.6 C)-98.7 F (37.1 C)] 97.8 F (36.6 C) (11/26 0527) Pulse Rate:  [73-76] 76 (11/26 0527) Resp:  [16-18] 18 (11/26 0527) BP: (135-140)/(52-65) 135/65 (11/26 0527) SpO2:  [93 %-97 %] 95 % (11/26 0753) Weight:  [249 lb 1.9 oz (113 kg)] 249 lb 1.9 oz (113 kg) (11/26 0500) Physical Exam: General: Pleasant, sitting up in chair eating  breakfast, in NAD, coughing intermittently HEENT: NCAT, MMM Cardiovascular: irregularly irregular rhythm, normal rate Respiratory: Normal work of breathing, somewhat diminished breath sounds throughout all lung fields, no crackles, faint end expiratory  wheezes Gastrointestinal: soft, non-distended, +BS MSK: 1+ pitting edema in lower extremities L>R, moves all extremities equally; bilateral capillary refill and pulses intact  Neuro: Awake, alert, no focal deficits  Laboratory:  Recent Labs Lab 05/03/16 0215 05/04/16 0352 05/05/16 0515  WBC 7.0 7.7 7.7  HGB 9.0* 9.0* 9.2*  HCT 27.5* 27.7* 28.7*  PLT 219 245 252    Recent Labs Lab 05/02/16 0926  05/03/16 0215 05/04/16 0352 05/05/16 0515  NA 128*  --  129* 133* 131*  K 4.5  --  4.5 3.8 3.7  CL 93*  --  95* 97* 95*  CO2 24  --  22 25 26   BUN 46*  --  51* 57* 55*  CREATININE 2.44*  < > 2.63* 2.50* 2.40*  CALCIUM 8.9  --  9.1 9.2 9.0  PROT 6.7  --   --   --   --   BILITOT 0.7  --   --   --   --   ALKPHOS 108  --   --   --   --   ALT 19  --   --   --   --   AST 34  --   --   --   --   GLUCOSE 165*  --  270* 58* 51*  < > = values in this interval not displayed.  Hgb A1c 6.6  Imaging/Diagnostic Tests: Dg Chest 2 View  Result Date: 05/02/2016 CLINICAL DATA:  Dry cough.  History of hypertension and diabetes. EXAM: CHEST  2 VIEW COMPARISON:  03/22/2016; 07/10/2015; 06/27/2014 FINDINGS: Grossly unchanged enlarged cardiac silhouette and mediastinal contours given slightly reduced lung volumes. Post median sternotomy. The pulmonary vasculature appears less distinct than present examination with cephalization of flow. Worsening bibasilar opacities, left greater than right. Interval development of small bilateral effusions, left greater than right. No pneumothorax. No acute osseus abnormalities. IMPRESSION: Findings most suggestive of pulmonary edema with small bilateral effusions and associated bibasilar opacities, left greater than right, atelectasis versus infiltrate. Electronically Signed   By: Sandi Mariscal M.D.   On: 05/02/2016 10:28   Nm Pulmonary Perf And Vent  Result Date: 05/02/2016 CLINICAL DATA:  82 year old male with shortness of breath for 5 days. EXAM: NUCLEAR MEDICINE  VENTILATION - PERFUSION LUNG SCAN TECHNIQUE: Ventilation images were obtained in multiple projections using inhaled aerosol Tc-55m DTPA. Perfusion images were obtained in multiple projections after intravenous injection of Tc-47m MAA. RADIOPHARMACEUTICALS:  30.3 mCi Technetium-80m DTPA aerosol inhalation and 4.0 mCi Technetium-81m MAA IV COMPARISON:  05/02/2016 chest radiograph FINDINGS: Ventilation: No focal ventilation defect. Perfusion: No wedge shaped peripheral perfusion defects to suggest acute pulmonary embolism. No perfusion-ventilation mismatches identified. IMPRESSION: Unremarkable study - no evidence of pulmonary emboli. Electronically Signed   By: Margarette Canada M.D.   On: 05/02/2016 19:50     Sela Hua, MD 05/05/2016, 8:30 AM PGY-2, [place] Intern pager: [phone], text pages welcome

## 2016-05-05 NOTE — Progress Notes (Signed)
Reassessed patient after he had received a duoneb and solumedrol. He states he is feeling much better. He is more alert and his shortness of breath seems to have improved. On exam, he is moving better air. No wheezing. I can now hear crackles in his lung bases bilaterally.  - Will give another dose of IV Lasix 40mg  x 1 - Continue scheduled duonebs - Avoid opioids - Will continue to monitor closely overnight  Hyman Bible, MD

## 2016-05-05 NOTE — Discharge Summary (Signed)
[facility] Discharge Summary  Patient [name] Medical record number: [id number] Date of birth: Aug 10, 1927 Age: 82 y.o. Gender: male Date of Admission: 05/02/2016  Date of Discharge: 05/08/2016 Admitting Physician: Kinnie Feil, MD  Primary Care Provider: [name], MD Consultants: None  Indication for Hospitalization: Weakness, shortness of breath  Discharge Diagnoses/Problem List:  CAP Acute on chronic HFpEF AKI on CKD III Chronic anemia A-fib T2DM HTN Aortic stenosis Hx CVA w/ left sided weakness  Disposition: SNF  Discharge Condition: Stable/Improved  Discharge Exam:  Temp:  [97.4 F (36.3 C)-98.1 F (36.7 C)] 97.4 F (36.3 C) (11/29 0622) Pulse Rate:  [57-66] 57 (11/29 0622) Resp:  [16-18] 18 (11/29 0622) BP: (115-122)/(53-79) 115/79 (11/29 0622) SpO2:  [96 %-100 %] 96 % (11/29 0810) Weight:  [112 kg (246 lb 14.6 oz)-112.4 kg (247 lb 12.8 oz)] 112 kg (246 lb 14.6 oz) (11/29 0503) Physical Exam: General: Pleasant, sitting up in chair and eating breakfast HEENT: NCAT, MMM Cardiovascular: RRR, no m/r/g Respiratory: Normal work of breathing, somewhat diminished breath sounds throughout all lung field but auscultate good air movement, +sparse crackles in bil mid-lung fields, without wheezes Gastrointestinal: soft, nontender, nondistended, normoactive BS MSK: 1+ edema in lower extremities L>R, moves all extremities equally; bilateral capillary refill, pulses intact  Neuro: Awake, alert, no focal deficits  Brief Hospital Course:  [name] is an 82 year old male who presented to the ED with weakness, cough, and SOB. In the ED, he was afebrile. No leukocytosis. He was not meeting SIRS criteria. CXR showed evidence of pulmonary edema and interstitial infiltrates L > R, concerning for pneumonia. V/Q scan was performed and was negative for PE. He was admitted for further management. Hospital course is described by  problem below.  Shortness of Breath: Likely secondary to a combination of CAP and CHF exacerbation. For the CAP, he received Ceftriaxone x 4 days and Azithromycin x 5 days. Urine strep pneumo and urine legionella were negative. For the CHF exacerbation, he received Lasix 40mg  IV bid for 4 days and was transitioned to torsemide 20 mg PO for better bioavailability than PO lasix. We also thought he possibly had undiagnosed COPD because he was noted to have poor air movement throughout all lung fields as well as expiratory wheezing. We treated him with Duonebs and Solumedrol 125mg  x 1, discharged with prednisone to complete a 5 day steroid burst. Patient was also discharged with spiriva controller given exacerbation requiring hospitalization. He would likely benefit from outpatient PFTs to assess this further.  Normocytic Anemia: Pt has history of chronic anemia secondary to iron deficiency, CKD, and a recent GI bleed due to angiodysplasia of the stomach. Hgb was 8.0 on admission and Pt received 1 unit pRBCs. His Hgb stabilized after the blood transfusion. Hgb was stable at 9.1 on the day of discharge.  AKI on CKD III: Pt was noted to have an elevated Cr on admission to 2.44. Cr increased to 2.63 and then trended down to 2.07.   Issues for Follow Up:  1. Recommend outpatient PFTs, as Pt was noted to have wheezing and shortness of breath that improved with duonebs. He has a history of smoking a pipe and his wife was a heavy smoker. He was discharged with spiriva inhaler to use once daily as a controller, also with a short course of dunoebs to use as needed over the next 5 days as his breathing continues to improve. 2. Hyponatremic chronically, Na 123  at discharge. Please repeat BMET on 05/10/16 3. Patient discharged with one more day of prednisone 50 mg to complete a 5 day steroid burst. 4. Patient previously on Lasix 40 mg daily, transitioned to torsemide 20 mg daily as this medication has more consistent  bioavailability. Lasix were stopped.  Significant Procedures: None  Significant Labs and Imaging:   Recent Labs Lab 05/06/16 0615 05/07/16 0511 05/08/16 0549  WBC 7.9 10.4 8.4  HGB 10.1* 9.2* 9.0*  HCT 31.1* 27.8* 27.3*  PLT 241 278 271    Recent Labs Lab 05/02/16 0926  05/04/16 0352 05/05/16 0515 05/06/16 0615 05/07/16 0511 05/08/16 0549  NA 128*  < > 133* 131* 130* 125* 122*  K 4.5  < > 3.8 3.7 4.0 4.0 4.4  CL 93*  < > 97* 95* 92* 88* 88*  CO2 24  < > 25 26 24 25 22   GLUCOSE 165*  < > 58* 51* 279* 296* 301*  BUN 46*  < > 57* 55* 57* 65* 68*  CREATININE 2.44*  < > 2.50* 2.40* 2.23* 2.18* 2.07*  CALCIUM 8.9  < > 9.2 9.0 9.0 8.8* 8.7*  ALKPHOS 108  --   --   --   --   --   --   AST 34  --   --   --   --   --   --   ALT 19  --   --   --   --   --   --   ALBUMIN 3.5  --   --   --   --   --   --   < > = values in this interval not displayed.    Results/Tests Pending at Time of Discharge: None  Discharge Medications:    Medication List    STOP taking these medications   furosemide 40 MG tablet Commonly known as:  LASIX     TAKE these medications   acetaminophen 325 MG tablet Commonly known as:  TYLENOL Take 2 tablets (650 mg total) by mouth every 6 (six) hours as needed for mild pain (or Fever >/= 101).   amLODipine 5 MG tablet Commonly known as:  NORVASC TAKE 1 TABLET EVERY DAY   aspirin EC 81 MG tablet Take 1 tablet (81 mg total) by mouth daily.   calcitRIOL 0.25 MCG capsule Commonly known as:  ROCALTROL TAKE ONE CAPSULE THREE TIMES A WEEK (MONDAY, WEDNESDAY, AND FRIDAY)   carvedilol 6.25 MG tablet Commonly known as:  COREG Take 1 tablet (6.25 mg total) by mouth 2 (two) times daily.   cholecalciferol 1000 units tablet Commonly known as:  VITAMIN D Take 1,000 Units by mouth daily.   escitalopram 10 MG tablet Commonly known as:  LEXAPRO TAKE 1 TABLET EVERY DAY WITH DINNER   ferrous sulfate 325 (65 FE) MG tablet Take 325 mg by mouth daily  with breakfast.   HYDROcodone-acetaminophen 5-325 MG tablet Commonly known as:  NORCO/VICODIN Take 1 tablet by mouth daily as needed for moderate pain.   hydroxychloroquine 200 MG tablet Commonly known as:  PLAQUENIL Take 200 mg by mouth daily.   insulin aspart 100 UNIT/ML injection Commonly known as:  novoLOG Inject 3-7 Units into the skin 3 (three) times daily with meals as needed for high blood sugar. Pt uses as needed per sliding scale:    100-150:  3 units  151-200:  4 units  201-250:  5 units  Greater than 250:  7 units   insulin  glargine 100 UNIT/ML injection Commonly known as:  LANTUS Inject 50 Units into the skin every morning.   ipratropium-albuterol 0.5-2.5 (3) MG/3ML Soln Commonly known as:  DUONEB Take 3 mLs by nebulization every 6 (six) hours as needed.   isosorbide mononitrate 30 MG 24 hr tablet Commonly known as:  IMDUR Take 30 mg by mouth daily.   multivitamin with minerals Tabs tablet Take 1 tablet by mouth daily.   pantoprazole 40 MG tablet Commonly known as:  PROTONIX Take 1 tablet (40 mg total) by mouth daily.   predniSONE 50 MG tablet Commonly known as:  DELTASONE Take 1 tablet (50 mg total) by mouth daily. Take one tablet on 11/30 to complete your steroid course. Start taking on:  05/09/2016   PRESERVISION AREDS 2 Caps Take 1 capsule by mouth 2 (two) times daily.   tamsulosin 0.4 MG Caps capsule Commonly known as:  FLOMAX TAKE 1 CAPSULE BY MOUTH DAILY AFTER SUPPER   tiotropium 18 MCG inhalation capsule Commonly known as:  SPIRIVA Place 1 capsule (18 mcg total) into inhaler and inhale daily.   torsemide 20 MG tablet Commonly known as:  DEMADEX Take 1 tablet (20 mg total) by mouth daily. Start taking on:  05/09/2016   vitamin C 500 MG tablet Commonly known as:  ASCORBIC ACID Take 500 mg by mouth daily.       Discharge Instructions: Please refer to Patient Instructions section of EMR for full details.  Patient was counseled important  signs and symptoms that should prompt return to medical care, changes in medications, dietary instructions, activity restrictions, and follow up appointments.   Follow-Up Appointments:   Everrett Coombe, MD 05/08/2016, 12:11 PM PGY-1, [place]

## 2016-05-06 LAB — TYPE AND SCREEN
ABO/RH(D): O POS
ANTIBODY SCREEN: NEGATIVE
UNIT DIVISION: 0
UNIT DIVISION: 0

## 2016-05-06 LAB — CBC
HCT: 31.1 % — ABNORMAL LOW (ref 39.0–52.0)
Hemoglobin: 10.1 g/dL — ABNORMAL LOW (ref 13.0–17.0)
MCH: 27.7 pg (ref 26.0–34.0)
MCHC: 32.5 g/dL (ref 30.0–36.0)
MCV: 85.4 fL (ref 78.0–100.0)
PLATELETS: 241 10*3/uL (ref 150–400)
RBC: 3.64 MIL/uL — AB (ref 4.22–5.81)
RDW: 15 % (ref 11.5–15.5)
WBC: 7.9 10*3/uL (ref 4.0–10.5)

## 2016-05-06 LAB — BASIC METABOLIC PANEL
Anion gap: 14 (ref 5–15)
BUN: 57 mg/dL — AB (ref 6–20)
CO2: 24 mmol/L (ref 22–32)
Calcium: 9 mg/dL (ref 8.9–10.3)
Chloride: 92 mmol/L — ABNORMAL LOW (ref 101–111)
Creatinine, Ser: 2.23 mg/dL — ABNORMAL HIGH (ref 0.61–1.24)
GFR calc Af Amer: 29 mL/min — ABNORMAL LOW (ref >60)
GFR, EST NON AFRICAN AMERICAN: 25 mL/min — AB (ref >60)
GLUCOSE: 279 mg/dL — AB (ref 65–99)
POTASSIUM: 4 mmol/L (ref 3.5–5.1)
Sodium: 130 mmol/L — ABNORMAL LOW (ref 135–145)

## 2016-05-06 LAB — GLUCOSE, CAPILLARY
GLUCOSE-CAPILLARY: 252 mg/dL — AB (ref 65–99)
GLUCOSE-CAPILLARY: 377 mg/dL — AB (ref 65–99)
Glucose-Capillary: 200 mg/dL — ABNORMAL HIGH (ref 65–99)
Glucose-Capillary: 273 mg/dL — ABNORMAL HIGH (ref 65–99)
Glucose-Capillary: 294 mg/dL — ABNORMAL HIGH (ref 65–99)
Glucose-Capillary: 367 mg/dL — ABNORMAL HIGH (ref 65–99)

## 2016-05-06 MED ORDER — PREDNISONE 20 MG PO TABS
50.0000 mg | ORAL_TABLET | Freq: Every day | ORAL | Status: DC
  Administered 2016-05-06 – 2016-05-09 (×4): 50 mg via ORAL
  Filled 2016-05-06: qty 1
  Filled 2016-05-06: qty 2
  Filled 2016-05-06 (×2): qty 1

## 2016-05-06 MED ORDER — FUROSEMIDE 10 MG/ML IJ SOLN
40.0000 mg | Freq: Once | INTRAMUSCULAR | Status: AC
  Administered 2016-05-06: 40 mg via INTRAVENOUS
  Filled 2016-05-06: qty 4

## 2016-05-06 NOTE — Progress Notes (Signed)
Physical Therapy Treatment Patient Details [name] MRN: [id number] DOB: 09/26/1927 Today's Date: 05/06/2016    History of Present Illness 82 y.o. male presenting with weakness and shortness of breath in setting of recent URI. PMH is significant for CHF, HTN, CAD, hx of CVA with residual weakness, T2DM, anemia in the setting of angiodysplasia of stomach, history of prostate cancer    PT Comments    The pt is making progress toward his goals, but is limited by decreased endurance and strength.  Pt ambulated further needing one rest break.  His O2 saturation remained within 96-99% during ambulation. Continue with POC focusing on strengthening and endurance next session.  Follow Up Recommendations  Home health PT;Supervision/Assistance - 24 hour     Equipment Recommendations  None recommended by PT    Recommendations for Other Services       Precautions / Restrictions Precautions Precautions: Fall Precaution Comments: watch O2 saturations Restrictions Weight Bearing Restrictions: No    Mobility  Bed Mobility               General bed mobility comments: received in chair  Transfers Overall transfer level: Needs assistance Equipment used: Rolling walker (2 wheeled) Transfers: Sit to/from Stand Sit to Stand: Mod assist         General transfer comment: Mod A to power up from chair and verbal cues for hand placement.  Ambulation/Gait Ambulation/Gait assistance: Min guard Ambulation Distance (Feet): 130 Feet Assistive device: Rolling walker (2 wheeled) Gait Pattern/deviations: Step-through pattern;Decreased stance time - left;Decreased stance time - right;Trunk flexed;Shuffle Gait velocity: decreased Gait velocity interpretation: Below normal speed for age/gender General Gait Details: Verbal cue for foot clearance and for upright posture.  Pt required one rest break, where his O2 saturation was 96%.   Stairs            Wheelchair Mobility     Modified Rankin (Stroke Patients Only)       Balance     Sitting balance-[name]: Good       Standing balance-[name]: Fair                      Cognition Arousal/Alertness: Awake/alert Behavior During Therapy: WFL for tasks assessed/performed Overall Cognitive Status: Within Functional Limits for tasks assessed                      Exercises      General Comments        Pertinent Vitals/Pain Pain Assessment: No/denies pain    Home Living                      Prior Function            PT Goals (current goals can now be found in the care plan section) Acute Rehab PT Goals Patient Stated Goal: to get stronger PT Goal Formulation: With patient Time For Goal Achievement: 05/17/16 Potential to Achieve Goals: Good Progress towards PT goals: Progressing toward goals    Frequency    Min 3X/week      PT Plan      Co-evaluation             End of Session Equipment Utilized During Treatment: Gait belt Activity Tolerance: Patient limited by fatigue Patient left: in chair;with call bell/phone within reach;with chair alarm set     Time: 1111-1140 PT Time Calculation (min) (ACUTE ONLY): 29 min  Charges:  $Gait Training:  23-37 mins                    G Codes:      [name] May 19, 2016, 1:28 PM  [name], [place]

## 2016-05-06 NOTE — Progress Notes (Signed)
Occupational Therapy Treatment Patient Details [name] MRN: [id number] DOB: Oct 21, 1927 Today's Date: 05/06/2016    History of present illness 82 y.o. male presenting with weakness and shortness of breath in setting of recent URI. PMH is significant for CHF, HTN, CAD, hx of CVA with residual weakness, T2DM, anemia in the setting of angiodysplasia of stomach, history of prostate cancer   OT comments  Upon entering the room, pt seated in recliner chair with friend present in the room. Pt reports fatigue but agreeable to participate in OT intervention with encouragement. Pt performed sit >stand with mod A as pt needs lifting assistance from chair. Pt ambulating with steady assistance to sink with RW 10' in order to obtain razor to shave self. Pt seated for grooming task for energy conservation. Pt standing with lifting assist again and returning to recliner chair in same manner. Chair alarm set. All needs within reach upon exiting the room. Pt encouraged to use incentive spirometer before leaving the room.    Follow Up Recommendations  No OT follow up;Supervision - Intermittent    Equipment Recommendations  None recommended by OT    Recommendations for Other Services      Precautions / Restrictions Precautions Precautions: Fall Precaution Comments: watch O2 saturations Restrictions Weight Bearing Restrictions: No       Mobility Bed Mobility     General bed mobility comments: received in chair  Transfers Overall transfer level: Needs assistance Equipment used: Rolling walker (2 wheeled) Transfers: Sit to/from Stand Sit to Stand: Mod assist         General transfer comment: Mod A to power up from chair and verbal cues for hand placement.    Balance Overall balance assessment: Needs assistance Sitting-balance support: Feet supported;No upper extremity supported Sitting balance-[name]: Good     Standing balance support: During functional  activity Standing balance-[name]: Fair Standing balance comment: pt needing steady assistance and use of RW for support        ADL Overall ADL's : Needs assistance/impaired      General ADL Comments: Pt ambulating with RW 10' to sink with steady assistance for safety. Pt sitting on EOB to shave face with electirc razor. O2 saturation remains above 97% this session with tasks. Pt returned to recliner chair at end of session in same manner.                 Cognition   Behavior During Therapy: WFL for tasks assessed/performed Overall Cognitive Status: Within Functional Limits for tasks assessed                        Pertinent Vitals/ Pain       Pain Assessment: No/denies pain         Frequency  Min 2X/week        Progress Toward Goals  OT Goals(current goals can now be found in the care plan section)  Progress towards OT goals: Progressing toward goals  Acute Rehab OT Goals Patient Stated Goal: to get stronger Potential to Achieve Goals: Good  Plan Discharge plan remains appropriate       End of Session Equipment Utilized During Treatment: Rolling walker   Activity Tolerance Patient tolerated treatment well   Patient Left in chair;with call bell/phone within reach;with chair alarm set;with family/visitor present           Time: 1354-1421 OT Time Calculation (min): 27 min  Charges: OT General Charges $OT Visit: 1  Procedure OT Treatments $Self Care/Home Management : 23-37 mins (27)  Wah Sabic P, MS, OTR/L 05/06/2016, 2:29 PM

## 2016-05-06 NOTE — Clinical Social Work Note (Signed)
Clinical Social Work Assessment  Patient Details  [name] MRN: [id number] Date of Birth: Aug 07, 1927  Date of referral:  05/06/16               Reason for consult:  Facility Placement                Permission sought to share information with:  Chartered certified accountant granted to share information::  Yes, Verbal Permission Granted  Name::     Social research officer, government::  SNF  Relationship::  son  Contact Information:     Housing/Transportation Living arrangements for the past 2 months:  [place] of Information:  Patient, Adult Children Patient Interpreter Needed:  None Criminal Activity/Legal Involvement Pertinent to Current Situation/Hospitalization:  No - Comment as needed Significant Relationships:  Adult Children Lives with:  Self Do you feel safe going back to the place where you live?  No Need for family participation in patient care:  Yes (Comment) (son helping with decision making)  Care giving concerns:  Pt lives at home alone and has had several falls over the past 2 weeks- has a few hours of caregiving a day but is otherwise alone for most the day.   Social Worker assessment / plan:  CSW spoke with pt about possibility of SNF because of lack of support at home given pt current mobility/safety concerns.  Pt acknowledges that he is weaker than normal- states he has been to SNF in the past and understands the referral process.  CSW also spoke with pt son who agrees pt is unsafe to return home.  Son has concerns about long term safety at home- worried pt will not be able to care for himself long term but understands that pt being recommended for rehab to see what kind of improvements can be made with mobility.  CSW discussed short term vs LTC SNF and discussed need to examine if pt can pay privately for SNF or will need Medicaid.  Employment status:  Retired Health visitor PT Recommendations:  Home with [place], [place] / Referral to community resources:  [place]  Patient/Family's Response to care:  Pt and son agreeable to SNF placement for short term rehab.  Pt son hopeful for eventual LTC placement but states he believes pt will be very opposed to this plan.  Patient/Family's Understanding of and Emotional Response to Diagnosis, Current Treatment, and Prognosis:  Pt hopeful he can leave hospital soon- pt has no questions or concerns with current care but is anxious to leave the hospital.  Emotional Assessment Appearance:  Appears stated age Attitude/Demeanor/Rapport:    Affect (typically observed):  Appropriate Orientation:  Oriented to  Time, Oriented to Situation, Oriented to Place, Oriented to Self Alcohol / Substance use:  Not Applicable Psych involvement (Current and /or in the community):  No (Comment)  Discharge Needs  Concerns to be addressed:  Care Coordination Readmission within the last 30 days:  No Current discharge risk:  Physical Impairment Barriers to Discharge:  Continued Medical Work up   Jorge Ny, LCSW 05/06/2016, 11:49 AM

## 2016-05-06 NOTE — Progress Notes (Signed)
Transitions of Care Pharmacy Note  Plan:  Educated on how steroids can affect blood sugar, proper use and administration of insulin, and possible need for maintenance inhalers. I explained that if he is put on an inhaler that it will need to be used every day and not just when he feels short of breath.   Follow-up PFTS and possible maintenance inhaler (Spiriva).  --------------------------------------------- [name] is an 82 y.o. male who presents with a chief complaint of weakness and shortness of breath. In anticipation of discharge, pharmacy has reviewed this patient's prior to admission medication history, as well as current inpatient medications listed per the [facility].  Current medication indications, dosing, frequency, and notable side effects reviewed with patient. patient verbalized understanding of current inpatient medication regimen and is aware that the After Visit Summary when presented, will represent the most accurate medication list at discharge.   [name] did not express any concerns about his medication regimen and expressed that he always felt better when he left the hospital.    Assessment: Understanding of regimen: fair Understanding of indications: fair Potential of compliance: fair Barriers to Obtaining Medications: No  Patient instructed to contact inpatient pharmacy team with further questions or concerns if needed.    Time spent preparing for discharge counseling: 10 minutes  Time spent counseling patient: 15 minutes    Thank you for allowing pharmacy to be a part of this patient's care.  Ihor Austin, PharmD PGY1 Pharmacy Resident Pager: [phone]

## 2016-05-06 NOTE — Progress Notes (Signed)
Inpatient Diabetes Program Recommendations  AACE/ADA: New Consensus Statement on Inpatient Glycemic Control (2015)  Target Ranges:  Prepandial:   less than 140 mg/dL      Peak postprandial:   less than 180 mg/dL (1-2 hours)      Critically ill patients:  140 - 180 mg/dL   Lab Results  Component Value Date   GLUCAP 367 (H) 05/06/2016   HGBA1C 6.6 (H) 05/02/2016   Review of Glycemic Control  Diabetes history: DM 2 Outpatient Diabetes medications: Novolog 3-7 units TID, Lantus 50 units Daily Current orders for Inpatient glycemic control: Lantus 50 units Daily, Novolog Moderate TID + HS scale  Inpatient Diabetes Program Recommendations:   Consider Novolog 6 units TID meal coverage while on steroids.   Thanks,  Tama Headings RN, MSN, [facility] Inpatient Diabetes Coordinator Team Pager [phone] (8a-5p)

## 2016-05-06 NOTE — Progress Notes (Signed)
Family Medicine Teaching Service Daily Progress Note Intern Pager: [phone]  Patient [name] Medical record number: [id number] Date of birth: [birth year] Age: 82 y.o. Gender: male  Primary Care Provider: [name], MD Consultants: none Code Status: DNR/DNI  Pt Overview and Major Events to Date:  11/23 - admit to [place] for SOB and weakness  Assessment and Plan: [name] is a 82 y.o. male presenting with weakness and shortness of breath in setting of recent URI. PMH is significant for CHF, HTN, CAD, hx of CVA with residual weakness, T2DM, anemia in the setting of angiodysplasia of stomach, history of prostate cancer  Altered mental status, new: patient noted to be more somnolent yesterday afternoon, family said decreased mentation from baseline, patient also with inc dyspnea that improved with solumedrol and duonebs. Was noted to have bibasilar rales and was given 40 IV Lasix x1 overnight. - CXR 11/26 - lungs clear, no pleural fluid - ABG 7.418/40.8/64.6/26.0 (Normal blood gas profile) - was given solumedrol 125 mg x1 - was restarted on duonebs standing TID with q6h PRN  Dyspnea 2/2 CHF exacerbation/?undiagnosed COPD. S/p 40 mg IV Lasix BID x 3 days UOP -4.6L since admit, UOP -1.7L o/n. Weight actually increased 6 lbs since admit. On exam, +bibasilar rales +non-pitting bil edema L>R  - S/p solumedrol 125 mg x1 (11/26) - Cr improved 2.23 from 2.4, would consider one more dose lasix IV 40 mg today and then maybe transition to PO - continue Duonebs TID and q6h PRN, IS - cardiac monitoring - continuous pulse ox, supplemental oxygen as needed - daily weights and I&Os - PT- HHPT, 24h supervision, OT- no OT f/u - Pt son would like for him to go to SNF, CSW consulted.  Possible Community Acquired Pneumonia  Afebrile, no leukocytosis. On Rocephin and PO Azithro. Pt has PCN allergy so cannot transition to Augmentin. Will have to do Omnicef, but we only have the oral  suspension here. Pharmacy recommending continuing IV Rocephin while inpatient and completing course with [facility] as an outpatient. - Continue day #4 Rocephin and Azithro (11/24 - )  for suspected PNA. - duonebs, pulse ox as above  Normocytic Anemia - chronic anemia w/ hx of iron deficiency, CKD, and recent GI bleed 2/2 angiodysplasia of stomach. S/p 1 unit pRBCs on 11/23. Hgb 8.0 on admission. Stable at 10.1 today. -continue home iron supplementation - Transfusion threshold is 8 - Daily CBCs  AKI on CKD3, improving - Baseline appears to be ~2.10, on admission creatinine 2.44 >2.63 >2.4 > 2.23 - Careful diuresis with Lasix 40mg  IV which has been judiciously given twice daily x3 days as noted above - avoid nephrotoxic medications - monitor daily  Chronic Hyponatremia- likely contributing to weakness. Likely related to volume overload in setting of CHF. Overall improving from 128 on admission > 130 today. - fluid restrict and diurese as above - Continue to monitor  A fib- currently in a fib, rate controlled, on coreg at home. Takes aspirin daily. - continue Aspirin 81mg  daily and Coreg 6.25mg  bid - monitor on telemetry  Type 2 Diabetes-  A1C 6.6. Takes 50 units Lantus every morning and 3-7 units of Novolog TID.  CBGs have been 99-269 in the last 24 hours. - continue home lantus - moderate sliding scale - CBG AC/HS  HTN- on coreg and norvasc at home. Controlled. -continue home medications  Constipation- Resolved.  - Continue colace and miralax  Depression- stable, takes Lexapro 10mg  daily -continue home medication  FEN/GI: carb modified/heart healthy diet, protonix  Prophylaxis: lovenox  Disposition: discharge pending adequate diuresis. Family would like SNF placement, CSW consulted.  Subjective:  Patient asleep and easily arousable in chair this morning. States slept well last night. States breathing improved.   Objective: Temp:  [97.3 F (36.3 C)-98.7 F (37.1 C)]  97.8 F (36.6 C) (11/27 0914) Pulse Rate:  [62-79] 72 (11/27 0914) Resp:  [18] 18 (11/26 1508) BP: (113-165)/(49-114) 165/114 (11/27 0914) SpO2:  [96 %-100 %] 100 % (11/27 0914) Physical Exam: General: Pleasant, sitting up in chair, easily arousable HEENT: NCAT, MMM Cardiovascular: irregularly irregular rhythm, normal rate Respiratory: Normal work of breathing, somewhat diminished breath sounds throughout all lung fields, +bibasilar rales, no end expiratory wheezes (improved from previous) Gastrointestinal: soft, non-distended, +BS MSK: 1+ edema in lower extremities L>R, moves all extremities equally; bilateral capillary refill and pulses intact  Neuro: Awake, alert, no focal deficits  Laboratory:  Recent Labs Lab 05/04/16 0352 05/05/16 0515 05/06/16 0615  WBC 7.7 7.7 7.9  HGB 9.0* 9.2* 10.1*  HCT 27.7* 28.7* 31.1*  PLT 245 252 241    Recent Labs Lab 05/02/16 0926  05/04/16 0352 05/05/16 0515 05/06/16 0615  NA 128*  < > 133* 131* 130*  K 4.5  < > 3.8 3.7 4.0  CL 93*  < > 97* 95* 92*  CO2 24  < > 25 26 24   BUN 46*  < > 57* 55* 57*  CREATININE 2.44*  < > 2.50* 2.40* 2.23*  CALCIUM 8.9  < > 9.2 9.0 9.0  PROT 6.7  --   --   --   --   BILITOT 0.7  --   --   --   --   ALKPHOS 108  --   --   --   --   ALT 19  --   --   --   --   AST 34  --   --   --   --   GLUCOSE 165*  < > 58* 51* 279*  < > = values in this interval not displayed.  Hgb A1c 6.6  Imaging/Diagnostic Tests: Dg Chest 2 View 05/02/2016 FINDINGS: Grossly unchanged enlarged cardiac silhouette and mediastinal contours given slightly reduced lung volumes. Post median sternotomy. The pulmonary vasculature appears less distinct than present examination with cephalization of flow. Worsening bibasilar opacities, left greater than right. Interval development of small bilateral effusions, left greater than right. No pneumothorax. No acute osseus abnormalities. IMPRESSION: Findings most suggestive of pulmonary edema  with small bilateral effusions and associated bibasilar opacities, left greater than right, atelectasis versus infiltrate. Electronically Signed   By: Sandi Mariscal M.D.   On: 05/02/2016 10:28   Nm Pulmonary Perf And Vent 05/02/2016 FINDINGS: Ventilation: No focal ventilation defect. Perfusion: No wedge shaped peripheral perfusion defects to suggest acute pulmonary embolism. No perfusion-ventilation mismatches identified.  IMPRESSION: Unremarkable study - no evidence of pulmonary emboli.   Dg Chest Port 1 View  05/05/2016 FINDINGS: Trachea is midline. Heart is enlarged. Lungs are clear. No pleural fluid. IMPRESSION: No acute findings.   Dg Knee Right Port 05/05/2016 FINDINGS: Osteoarthrosis of the knee with moderate lateral femorotibial, mild medial femorotibial, and severe femorotibial compartment joint space narrowing. There fibrocystic changes patellofemoral compartment and articular sclerosis of and lateral femorotibial compartment. Small periarticular osteophytes are present. Vascular calcifications. Small suprapatellar joint effusion. No acute fracture or dislocation. IMPRESSION: Tricompartmental osteoarthrosis of the knee greatest in the patellofemoral compartment with there is severe joint  space narrowing. Small joint effusion. No acute fracture or dislocation identified.   Everrett Coombe, MD 05/06/2016, 9:31 AM PGY-1, [place] Intern pager: [phone], text pages welcome

## 2016-05-06 NOTE — NC FL2 (Signed)
[place] LEVEL OF CARE SCREENING TOOL     IDENTIFICATION  Patient [name] Birthdate: [birth year] Sex: male Admission Date (Current Location): 05/02/2016  [facility] and [place] Number:  Engineering geologist and Address:         Provider Number: [id number]  Attending Physician Name and Address:  Kinnie Feil, MD  Relative Name and Phone Number:       Current Level of Care: Hospital Recommended Level of Care: [place] Prior Approval Number:    Date Approved/Denied:   PASRR Number: [id number]  Discharge Plan: SNF    Current Diagnoses: Patient Active Problem List   Diagnosis Date Noted  . Acute on chronic congestive heart failure ([place])   . Pneumonia 05/02/2016  . Shortness of breath 05/02/2016  . Anemia of chronic disease   . Absolute anemia   . Dyspnea   . Upper GI bleed 09/19/2015  . Minimal cerumen bilateral ear canals 08/18/2015  . Persistent atrial fibrillation ([place])   . Insomnia secondary to anxiety 07/19/2015  . Chronic diastolic heart failure ([place]) 04/20/2015  . Symptomatic anemia   . Acute on chronic combined systolic and diastolic congestive heart failure ([place])   . Long-term (current) use of anticoagulants   . Chronic anticoagulation   . Generalized abdominal pain   . Macular degeneration, dry 01/20/2015  . Weight loss 01/20/2015  . Arthritis 01/20/2015  . 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 Diabetic polyneuropathy associated with type 2 diabetes mellitus ([place]) 08/05/2014  . CKD (chronic kidney disease) stage 3, GFR 30-59 ml/min 07/21/2014  . Well controlled type 2 diabetes mellitus with nephropathy ([place]) 07/06/2014  . HTN (hypertension) 06/30/2014  . CVA (cerebral infarction) 06/29/2014  . Anemia in chronic kidney disease 11/10/2013  . Atrial fibrillation ([place]) 05/12/2013  . Mobitz type 1 second degree atrioventricular block 10/01/2012  . S/P AVR 03/24/2012  . Aortic valve  stenosis   . Junctional bradycardia   . Hyperlipidemia   . Angiodysplasia of stomach   . CAD (coronary artery disease)     Orientation RESPIRATION BLADDER Height & Weight     Self, Time, Situation, Place  Normal Incontinent Weight: 249 lb 1.9 oz (113 kg) Height:  5\' 10"  (177.8 cm)  BEHAVIORAL SYMPTOMS/MOOD NEUROLOGICAL BOWEL NUTRITION STATUS      Continent Diet (see DC summary)  AMBULATORY STATUS COMMUNICATION OF NEEDS Skin   Limited Assist Verbally Normal                       Personal Care Assistance Level of Assistance  Bathing, Dressing Bathing Assistance: Limited assistance   Dressing Assistance: Limited assistance     Functional Limitations Info             SPECIAL CARE FACTORS FREQUENCY  PT (By licensed PT), OT (By licensed OT)     PT Frequency: 5/wk OT Frequency: 5/wk            Contractures      Additional Factors Info  Code Status, Allergies, Insulin Sliding Scale Code Status Info: FULL Allergies Info: [place] Aspirin, Losartan, Metoprolol, Penicillins   Insulin Sliding Scale Info: 4/day       Current Medications (05/06/2016):  This is the current hospital active medication list Current Facility-Administered Medications  Medication Dose Route Frequency Provider Last Rate Last Dose  . acetaminophen (TYLENOL) tablet 650 mg  650 mg Oral Q6H PRN Steve Rattler, DO  650 mg at 05/05/16 1747  . amLODipine (NORVASC) tablet 5 mg  5 mg Oral Daily Steve Rattler, DO   5 mg at 05/06/16 [id number]  . aspirin EC tablet 81 mg  81 mg Oral Daily Steve Rattler, DO   81 mg at 05/06/16 [id number]  . azithromycin (ZITHROMAX) tablet 500 mg  500 mg Oral Q24H Steve Rattler, DO   500 mg at 05/06/16 [id number]  . carvedilol (COREG) tablet 6.25 mg  6.25 mg Oral BID Kinnie Feil, MD   6.25 mg at 05/06/16 [id number]  . cefTRIAXone (ROCEPHIN) 1 g in dextrose 5 % 50 mL IVPB  1 g Intravenous Q24H Steve Rattler, DO   1 g at 05/06/16 0927  . cholecalciferol (VITAMIN D) tablet 1,000 Units   1,000 Units Oral Daily Steve Rattler, DO   1,000 Units at 05/06/16 [id number]  . escitalopram (LEXAPRO) tablet 10 mg  10 mg Oral Daily Steve Rattler, DO   10 mg at 05/06/16 [id number]  . ferrous sulfate tablet 325 mg  325 mg Oral Q breakfast Steve Rattler, DO   325 mg at 05/06/16 [id number]  . guaiFENesin-dextromethorphan (ROBITUSSIN DM) 100-10 MG/5ML syrup 5 mL  5 mL Oral Q4H PRN Kinnie Feil, MD   5 mL at 05/05/16 1923  . hydroxychloroquine (PLAQUENIL) tablet 200 mg  200 mg Oral Daily Steve Rattler, DO   200 mg at 05/06/16 0921  . insulin aspart (novoLOG) injection 0-15 Units  0-15 Units Subcutaneous TID WC Virginia Crews, MD   8 Units at 05/06/16 [phone]  . insulin aspart (novoLOG) injection 0-5 Units  0-5 Units Subcutaneous QHS Virginia Crews, MD      . insulin glargine (LANTUS) injection 50 Units  50 Units Subcutaneous BH-q7a Steve Rattler, DO   50 Units at 05/06/16 0920  . ipratropium-albuterol (DUONEB) 0.5-2.5 (3) MG/3ML nebulizer solution 3 mL  3 mL Nebulization Q6H PRN Leeanne Rio, MD   3 mL at 05/05/16 1808  . ipratropium-albuterol (DUONEB) 0.5-2.5 (3) MG/3ML nebulizer solution 3 mL  3 mL Nebulization TID Sela Hua, MD      . isosorbide mononitrate (IMDUR) 24 hr tablet 30 mg  30 mg Oral Daily Steve Rattler, DO   30 mg at 05/06/16 0921  . pantoprazole (PROTONIX) EC tablet 40 mg  40 mg Oral Daily Steve Rattler, DO   40 mg at 05/06/16 [id number]  . polyethylene glycol (MIRALAX / GLYCOLAX) packet 17 g  17 g Oral Daily Steve Rattler, DO   17 g at 05/06/16 0920  . tamsulosin (FLOMAX) capsule 0.4 mg  0.4 mg Oral QPC supper Steve Rattler, DO   0.4 mg at 05/05/16 1747  . technetium TC 79M diethylenetriame-pentaacetic acid (DTPA) injection 30 millicurie  30 millicurie Inhalation Once PRN Sherryl Barters, MD         Discharge Medications: Please see discharge summary for a list of discharge medications.  Relevant Imaging Results:  Relevant Lab Results:   Additional  Information SS#: [id number]  Jorge Ny, LCSW

## 2016-05-06 NOTE — Progress Notes (Signed)
FPTS Interim Progress Note  S: Patient resting when I went to see him this afternoon. Breathing comfortably and speaking in full sentences.  No dyspnea, no chest pain. Mentating well, responds to questions appropriately.  O: BP 131/65 (BP Location: Left Arm)   Pulse 63   Temp 98.3 F (36.8 C) (Oral)   Resp 16   Ht 5\' 10"  (1.778 m)   Wt 113 kg (249 lb 1.9 oz)   SpO2 98%   BMI 35.74 kg/m   CARD: RRR, no m/r/g PULM: CTA, no wheezes or rales, moving good air, speaking in full sentences EXT: +tight nonpitting edema bilaterally PSYCH: AAOx3, thought process linear, affect appropriate  A/P: 1. Would hold off giving another dose of IV Lasix this evening 2. Consider transitioning to PO diuresis tomorrow; can think about using torsemide if does not diurese on home lasix 3. Continue steroids 50 mg prednisone to complete a 5 day course, continue duonebs TID and q6h PRN.  4. S/p adequate treatment for CAP.  Everrett Coombe, MD 05/06/2016, 4:26 PM PGY-1, [place] Medicine Service pager [phone]

## 2016-05-06 NOTE — Care Management Important Message (Signed)
Important Message  Patient Details  [name] MRN: [id number] Date of Birth: 1928/03/09   Medicare Important Message Given:  Yes    Nathen May 05/06/2016, 12:03 PM

## 2016-05-07 DIAGNOSIS — M6281 Muscle weakness (generalized): Secondary | ICD-10-CM

## 2016-05-07 DIAGNOSIS — J441 Chronic obstructive pulmonary disease with (acute) exacerbation: Secondary | ICD-10-CM

## 2016-05-07 DIAGNOSIS — J449 Chronic obstructive pulmonary disease, unspecified: Secondary | ICD-10-CM

## 2016-05-07 LAB — CBC
HCT: 27.8 % — ABNORMAL LOW (ref 39.0–52.0)
Hemoglobin: 9.2 g/dL — ABNORMAL LOW (ref 13.0–17.0)
MCH: 27.7 pg (ref 26.0–34.0)
MCHC: 33.1 g/dL (ref 30.0–36.0)
MCV: 83.7 fL (ref 78.0–100.0)
Platelets: 278 10*3/uL (ref 150–400)
RBC: 3.32 MIL/uL — AB (ref 4.22–5.81)
RDW: 14.7 % (ref 11.5–15.5)
WBC: 10.4 10*3/uL (ref 4.0–10.5)

## 2016-05-07 LAB — BASIC METABOLIC PANEL
Anion gap: 12 (ref 5–15)
BUN: 65 mg/dL — AB (ref 6–20)
CO2: 25 mmol/L (ref 22–32)
CREATININE: 2.18 mg/dL — AB (ref 0.61–1.24)
Calcium: 8.8 mg/dL — ABNORMAL LOW (ref 8.9–10.3)
Chloride: 88 mmol/L — ABNORMAL LOW (ref 101–111)
GFR, EST AFRICAN AMERICAN: 30 mL/min — AB (ref >60)
GFR, EST NON AFRICAN AMERICAN: 26 mL/min — AB (ref >60)
Glucose, Bld: 296 mg/dL — ABNORMAL HIGH (ref 65–99)
Potassium: 4 mmol/L (ref 3.5–5.1)
SODIUM: 125 mmol/L — AB (ref 135–145)

## 2016-05-07 LAB — GLUCOSE, CAPILLARY
GLUCOSE-CAPILLARY: 272 mg/dL — AB (ref 65–99)
Glucose-Capillary: 315 mg/dL — ABNORMAL HIGH (ref 65–99)
Glucose-Capillary: 222 mg/dL — ABNORMAL HIGH (ref 65–99)
Glucose-Capillary: 288 mg/dL — ABNORMAL HIGH (ref 65–99)

## 2016-05-07 MED ORDER — PANTOPRAZOLE SODIUM 40 MG PO TBEC
40.0000 mg | DELAYED_RELEASE_TABLET | Freq: Two times a day (BID) | ORAL | Status: DC
  Administered 2016-05-07 – 2016-05-16 (×18): 40 mg via ORAL
  Filled 2016-05-07 (×18): qty 1

## 2016-05-07 MED ORDER — TORSEMIDE 20 MG PO TABS
20.0000 mg | ORAL_TABLET | Freq: Every day | ORAL | Status: DC
  Administered 2016-05-07 – 2016-05-08 (×2): 20 mg via ORAL
  Filled 2016-05-07 (×2): qty 1

## 2016-05-07 MED ORDER — INSULIN ASPART 100 UNIT/ML ~~LOC~~ SOLN
0.0000 [IU] | Freq: Three times a day (TID) | SUBCUTANEOUS | Status: DC
  Administered 2016-05-07: 15 [IU] via SUBCUTANEOUS
  Administered 2016-05-07: 7 [IU] via SUBCUTANEOUS
  Administered 2016-05-08: 4 [IU] via SUBCUTANEOUS
  Administered 2016-05-08: 11 [IU] via SUBCUTANEOUS
  Administered 2016-05-08: 15 [IU] via SUBCUTANEOUS
  Administered 2016-05-09 – 2016-05-10 (×2): 4 [IU] via SUBCUTANEOUS

## 2016-05-07 NOTE — Progress Notes (Signed)
Family Medicine Teaching Service Daily Progress Note Intern Pager: [phone]  Patient [name] Medical record number: [id number] Date of birth: 05-23-1928 Age: 82 y.o. Gender: male  Primary Care Provider: [name], MD Consultants: none Code Status: DNR/DNI  Pt Overview and Major Events to Date:  11/23 - admit to [place] for SOB and weakness  Assessment and Plan: [name] is a 82 y.o. male presenting with weakness and shortness of breath in setting of recent URI. PMH is significant for CHF, HTN, CAD, hx of CVA with residual weakness, T2DM, anemia in the setting of angiodysplasia of stomach, history of prostate cancer  Dyspnea 2/2 CHF exacerbation/?undiagnosed COPD. S/p diuresis with 40 mg IV Lasix BID for 3d followed by 40 mg IV Lasix x1 yesterday.  UOP -7.8L since admit, UOP -3.1L o/n. Patient refused being weighed this morning. On exam lungs clear, moving good air, no wheezes this AM. - patient s/p diuresis with 40 mg IV Lasix BID, improved volume status on exam - will transition to torsemide 20 mg qd today and monitor for UOP - Cr improved 2.4 >>2.18 with diuresis - Continue day #3 of 5 steroid burst prednisone 50 mg  - continue Duonebs TID and q6h PRN, IS - cardiac monitoring - continuous pulse ox, supplemental oxygen as needed - daily weights and I&Os - PT- HHPT, 24h supervision, OT- no OT f/u - Pt son would like for him to go to SNF, CSW consulted.  Chronic Hyponatremia, worsened -  Likely related to volume overload in setting of CHF. Na 128 on admission, improved to 133 now trending down at 125 today, however patient was hyperglycemic overnight and corrected for hyperglycemia sodium is 129 which is near his baseline from admission. - fluid restrict and diurese as above - Continue to monitor  Possible Community Acquired Pneumonia  Afebrile, no leukocytosis. Patient sufficiently treated now s/p 5 days rocephin and azithromycin, CXR improved from admission. -  duonebs, pulse ox as above  Normocytic Anemia - chronic anemia w/ hx of iron deficiency, CKD, and recent GI bleed 2/2 angiodysplasia of stomach. S/p 1 unit pRBCs on 11/23 for Hgb 8.0 on admission. Hgb 9.2 from 10.1 today. -continue home iron supplementation - Transfusion threshold is 8 - Daily CBCs  AMS, resolved - noted 11/26 to be mentating less than baseline by family with associated increased dyspnea. Patient with normal ABG and improved CXR at that time, breathing improved with solumedrol, duonebs and lasix. - continue to monitor mental status closely  AKI on CKD3, improving - Baseline appears to be ~2.10. Now near baseline with Cr from 2.63 >>>2.18 today. - Careful diuresis with Lasix 40mg  IV which has been judiciously given over past four days as noted above - can transition to oral agents, either torsemide or lasix today  - avoid nephrotoxic medications - monitor daily  A fib- currently in a fib, rate controlled, on coreg at home. Takes aspirin daily. - continue Aspirin 81mg  daily and Coreg 6.25mg  bid - monitor on telemetry  Type 2 Diabetes-  A1C 6.6. Takes 50 units Lantus every morning and 3-7 units of Novolog TID.  CBGs have been elevated at 377, 272 overnight. - continue home lantus - transition to resistant sliding scale from moderate sliding scale today - CBG AC/HS  HTN- on coreg and norvasc at home. Controlled. -continue home medications  Constipation- Resolved.  - Continue colace and miralax  Depression- stable, takes Lexapro 10mg  daily -continue home medication  Angiodysplasia of stomach, last bleed  09/2015.  On 40 mg protonix at home also on ASA. - use SCDs rather than lovenox ppx - double protonix 40 mg BID  FEN/GI: carb modified/heart healthy diet, protonix  Prophylaxis: SCDs  Disposition: discharge pending adequate diuresis. Family would like SNF placement, CSW consulted.  Subjective:  Patient   Objective: Temp:  [97.9 F (36.6 C)-98.3 F  (36.8 C)] 97.9 F (36.6 C) (11/28 0457) Pulse Rate:  [63-70] 66 (11/28 0457) Resp:  [16-20] 20 (11/28 0457) BP: (131-155)/(57-65) 155/57 (11/28 0457) SpO2:  [97 %-100 %] 100 % (11/28 0457) Physical Exam: General: Pleasant, sitting up in chair, eating breakfast HEENT: NCAT, MMM Cardiovascular: irregularly irregular rhythm, normal rate Respiratory: Normal work of breathing, somewhat diminished breath sounds throughout all lung field but auscultate good air movement, no W/R/R (improved from previous)  Gastrointestinal: soft, nontender, nondistended, normoactive BS MSK: 1+ edema in lower extremities L>R, moves all extremities equally; bilateral capillary refill, pulses intact  Neuro: Awake, alert, no focal deficits  Laboratory: 05/06/2016 ABG 7.418/40.8/64.6/26.0 (Normal blood gas profile)  Recent Labs Lab 05/05/16 0515 05/06/16 0615 05/07/16 0511  WBC 7.7 7.9 10.4  HGB 9.2* 10.1* 9.2*  HCT 28.7* 31.1* 27.8*  PLT 252 241 278    Recent Labs Lab 05/02/16 0926  05/05/16 0515 05/06/16 0615 05/07/16 0511  NA 128*  < > 131* 130* 125*  K 4.5  < > 3.7 4.0 4.0  CL 93*  < > 95* 92* 88*  CO2 24  < > 26 24 25   BUN 46*  < > 55* 57* 65*  CREATININE 2.44*  < > 2.40* 2.23* 2.18*  CALCIUM 8.9  < > 9.0 9.0 8.8*  PROT 6.7  --   --   --   --   BILITOT 0.7  --   --   --   --   ALKPHOS 108  --   --   --   --   ALT 19  --   --   --   --   AST 34  --   --   --   --   GLUCOSE 165*  < > 51* 279* 296*  < > = values in this interval not displayed.  Hgb A1c 6.6  Imaging/Diagnostic Tests: Dg Chest 2 View 05/02/2016 FINDINGS: Grossly unchanged enlarged cardiac silhouette and mediastinal contours given slightly reduced lung volumes. Post median sternotomy. The pulmonary vasculature appears less distinct than present examination with cephalization of flow. Worsening bibasilar opacities, left greater than right. Interval development of small bilateral effusions, left greater than right. No  pneumothorax. No acute osseus abnormalities. IMPRESSION: Findings most suggestive of pulmonary edema with small bilateral effusions and associated bibasilar opacities, left greater than right, atelectasis versus infiltrate. Electronically Signed   By: Sandi Mariscal M.D.   On: 05/02/2016 10:28   Nm Pulmonary Perf And Vent 05/02/2016 FINDINGS: Ventilation: No focal ventilation defect. Perfusion: No wedge shaped peripheral perfusion defects to suggest acute pulmonary embolism. No perfusion-ventilation mismatches identified.  IMPRESSION: Unremarkable study - no evidence of pulmonary emboli.   Dg Chest Port 1 View  05/05/2016 FINDINGS: Trachea is midline. Heart is enlarged. Lungs are clear. No pleural fluid. IMPRESSION: No acute findings.   Dg Knee Right Port 05/05/2016 FINDINGS: Osteoarthrosis of the knee with moderate lateral femorotibial, mild medial femorotibial, and severe femorotibial compartment joint space narrowing. There fibrocystic changes patellofemoral compartment and articular sclerosis of and lateral femorotibial compartment. Small periarticular osteophytes are present. Vascular calcifications. Small suprapatellar joint effusion. No acute  fracture or dislocation. IMPRESSION: Tricompartmental osteoarthrosis of the knee greatest in the patellofemoral compartment with there is severe joint space narrowing. Small joint effusion. No acute fracture or dislocation identified.   Everrett Coombe, MD 05/07/2016, 9:33 AM PGY-1, [place] Intern pager: [phone], text pages welcome

## 2016-05-07 NOTE — Progress Notes (Signed)
Inpatient Diabetes Program Recommendations  AACE/ADA: New Consensus Statement on Inpatient Glycemic Control (2015)  Target Ranges:  Prepandial:   less than 140 mg/dL      Peak postprandial:   less than 180 mg/dL (1-2 hours)      Critically ill patients:  140 - 180 mg/dL   Lab Results  Component Value Date   GLUCAP 315 (H) 05/07/2016   HGBA1C 6.6 (H) 05/02/2016    Review of Glycemic Control  Results for GUINNESS, GOELZ (MRN [id number]) as of 05/07/2016 12:47  Ref. Range 05/06/2016 11:45 05/06/2016 17:01 05/06/2016 21:36 05/07/2016 07:57 05/07/2016 11:55  Glucose-Capillary Latest Ref Range: 65 - 99 mg/dL 367 (H) 252 (H) 377 (H) 272 (H) 315 (H)   Diabetes history: DM 2 Outpatient Diabetes medications: Novolog 3-7 units TID, Lantus 50 units Daily Current orders for Inpatient glycemic control: Lantus 50 units Daily, Novolog resistant correction tid + HS scale  Inpatient Diabetes Program Recommendations: Noted that patient's correction insulin dose was increased to resistant correction scale this morning.   Gentry Fitz, RN, BA, MHA, CDE Diabetes Coordinator Inpatient Diabetes Program  [phone] (Team Pager) [phone] ([place]) 05/07/2016 12:51 PM

## 2016-05-07 NOTE — Progress Notes (Signed)
Occupational Therapy Treatment Patient Details [name] MRN: [id number] DOB: Dec 02, 1927 Today's Date: 05/07/2016    History of present illness 82 y.o. male presenting with weakness and shortness of breath in setting of recent URI. PMH is significant for CHF, HTN, CAD, hx of CVA with residual weakness, T2DM, anemia in the setting of angiodysplasia of stomach, history of prostate cancer   OT comments  Pt making progress with functional goals. OT will continue to follow  Follow Up Recommendations  No OT follow up;Supervision - Intermittent;Other (comment) (prior notation says son wants SNF, pt states that he doesn't know if he will be going after d/c)    Equipment Recommendations       Recommendations for Other Services Rehab consult    Precautions / Restrictions Precautions Precautions: Fall Precaution Comments: watch O2 saturations Restrictions Weight Bearing Restrictions: No       Mobility Bed Mobility               General bed mobility comments: pt up in chair  Transfers Overall transfer level: Needs assistance Equipment used: Rolling walker (2 wheeled) Transfers: Sit to/from Stand Sit to Stand: Mod assist;Min assist         General transfer comment: Mod A to power up, verbal cues for hand placement.    Balance Overall balance assessment: Needs assistance   Sitting balance-[name]: Good       Standing balance-[name]: Fair                     ADL Overall ADL's : Needs assistance/impaired     Grooming: Min guard;Standing;Wash/dry hands;Wash/dry face   Upper Body Bathing: Set up;Sitting;Supervision/ safety Upper Body Bathing Details (indicate cue type and reason): simulated Lower Body Bathing: Minimal assistance;Sit to/from stand           Toilet Transfer: Ambulation;Regular Toilet;RW;Minimal assistance;Grab bars   Toileting- Clothing Manipulation and Hygiene: Sit to/from stand;Min guard       Functional  mobility during ADLs: Min guard;Rolling walker;Minimal assistance General ADL Comments: Pt ambulated to bathroom from recliner, to sink and back to recliner with O2 SATs >96%                Cognition   Behavior During Therapy: WFL for tasks assessed/performed Overall Cognitive Status: Within Functional Limits for tasks assessed                       Extremity/Trunk Assessment   generalized weakness                        General Comments  py very pleasant and cooperative    Pertinent Vitals/ Pain       Pain Assessment: No/denies pain                                                                     Progress Toward Goals  OT Goals(current goals can now be found in the care plan section)  Progress towards OT goals: Progressing toward goals  Acute Rehab OT Goals Patient Stated Goal: to get stronger OT Goal Formulation: With patient  Plan Discharge plan remains appropriate  End of Session Equipment Utilized During Treatment: Rolling walker;Gait belt   Activity Tolerance Patient tolerated treatment well   Patient Left in chair;with call bell/phone within reach;with chair alarm set             Time: [id number] OT Time Calculation (min): 35 min  Charges: OT General Charges $OT Visit: 1 Procedure OT Treatments $Self Care/Home Management : 8-22 mins $Therapeutic Activity: 8-22 mins  Britt Bottom 05/07/2016, 1:15 PM

## 2016-05-07 NOTE — Progress Notes (Signed)
Patient is a daily weight; cannot tolerate standing for a standing weight and refusing to get in the bed to get a weight from the bed scale. Patient is more comfortable in the chair, reports sleeping in the chair at home and wants to sleep a little more this morning.  Will continue to monitor the patient and attempt to have patient try the bed scale when he is ready to wake up.

## 2016-05-07 NOTE — Consult Note (Signed)
   [facility] Inpatient Consult   05/07/2016  [name] 09/27/1927 [id number]   Patient screened for potential [place] Management services for HF exacerbation. Patient is eligible for [facility] Care Management services under patient's Medicare plan.  Chart reviewed reveals [name] a [id number] y.o.malepresenting with weakness and shortness of breath in setting of recent URI. PMH is significant for HF, HTN, CAD, hx of CVA with residual weakness, T2DM, anemia in the setting of angiodysplasia of stomach, history of prostate cancer.  Spoke with inpatient RNCM Debbie who states the patient will go to a skilled nursing facility and no current [place] Management needs.   Please place a [facility] Care Management consult if this disposition changes or for questions contact:   Natividad Brood, RN BSN [place] [facility] Liaison  [phone] business mobile phone Toll free office [phone]

## 2016-05-08 LAB — CBC
HEMATOCRIT: 27.3 % — AB (ref 39.0–52.0)
HEMOGLOBIN: 9 g/dL — AB (ref 13.0–17.0)
MCH: 27.5 pg (ref 26.0–34.0)
MCHC: 33 g/dL (ref 30.0–36.0)
MCV: 83.5 fL (ref 78.0–100.0)
Platelets: 271 10*3/uL (ref 150–400)
RBC: 3.27 MIL/uL — ABNORMAL LOW (ref 4.22–5.81)
RDW: 14.8 % (ref 11.5–15.5)
WBC: 8.4 10*3/uL (ref 4.0–10.5)

## 2016-05-08 LAB — BASIC METABOLIC PANEL
ANION GAP: 12 (ref 5–15)
ANION GAP: 11 (ref 5–15)
BUN: 68 mg/dL — ABNORMAL HIGH (ref 6–20)
BUN: 68 mg/dL — ABNORMAL HIGH (ref 6–20)
CALCIUM: 8.7 mg/dL — AB (ref 8.9–10.3)
CALCIUM: 8.6 mg/dL — AB (ref 8.9–10.3)
CHLORIDE: 88 mmol/L — AB (ref 101–111)
CO2: 22 mmol/L (ref 22–32)
CO2: 24 mmol/L (ref 22–32)
CREATININE: 2.07 mg/dL — AB (ref 0.61–1.24)
Chloride: 88 mmol/L — ABNORMAL LOW (ref 101–111)
Creatinine, Ser: 2.18 mg/dL — ABNORMAL HIGH (ref 0.61–1.24)
GFR calc Af Amer: 32 mL/min — ABNORMAL LOW (ref >60)
GFR calc non Af Amer: 27 mL/min — ABNORMAL LOW (ref >60)
GFR, EST AFRICAN AMERICAN: 30 mL/min — AB (ref >60)
GFR, EST NON AFRICAN AMERICAN: 26 mL/min — AB (ref >60)
GLUCOSE: 301 mg/dL — AB (ref 65–99)
Glucose, Bld: 184 mg/dL — ABNORMAL HIGH (ref 65–99)
Potassium: 4.4 mmol/L (ref 3.5–5.1)
Potassium: 4.6 mmol/L (ref 3.5–5.1)
SODIUM: 123 mmol/L — AB (ref 135–145)
Sodium: 122 mmol/L — ABNORMAL LOW (ref 135–145)

## 2016-05-08 LAB — GLUCOSE, CAPILLARY
GLUCOSE-CAPILLARY: 261 mg/dL — AB (ref 65–99)
GLUCOSE-CAPILLARY: 317 mg/dL — AB (ref 65–99)
GLUCOSE-CAPILLARY: 172 mg/dL — AB (ref 65–99)
GLUCOSE-CAPILLARY: 192 mg/dL — AB (ref 65–99)

## 2016-05-08 MED ORDER — IPRATROPIUM-ALBUTEROL 0.5-2.5 (3) MG/3ML IN SOLN
3.0000 mL | Freq: Two times a day (BID) | RESPIRATORY_TRACT | Status: DC
  Administered 2016-05-08 – 2016-05-10 (×5): 3 mL via RESPIRATORY_TRACT
  Filled 2016-05-08 (×4): qty 3

## 2016-05-08 MED ORDER — DIPHENHYDRAMINE HCL 25 MG PO CAPS
25.0000 mg | ORAL_CAPSULE | Freq: Once | ORAL | Status: AC
  Administered 2016-05-08: 25 mg via ORAL
  Filled 2016-05-08: qty 1

## 2016-05-08 MED ORDER — TIOTROPIUM BROMIDE MONOHYDRATE 18 MCG IN CAPS
18.0000 ug | ORAL_CAPSULE | Freq: Every day | RESPIRATORY_TRACT | Status: DC
  Administered 2016-05-12 – 2016-05-16 (×5): 18 ug via RESPIRATORY_TRACT
  Filled 2016-05-08 (×2): qty 5

## 2016-05-08 MED ORDER — TIOTROPIUM BROMIDE MONOHYDRATE 18 MCG IN CAPS: 18.0000 ug | ORAL_CAPSULE | Freq: Every day | RESPIRATORY_TRACT | 12 refills | Status: DC

## 2016-05-08 MED ORDER — TORSEMIDE 20 MG PO TABS: 20.0000 mg | ORAL_TABLET | Freq: Every day | ORAL | 0 refills | Status: DC

## 2016-05-08 MED ORDER — PREDNISONE 50 MG PO TABS
50.0000 mg | ORAL_TABLET | Freq: Every day | ORAL | 0 refills | Status: DC
Start: 2016-05-09 — End: 2016-05-16

## 2016-05-08 MED ORDER — IPRATROPIUM-ALBUTEROL 0.5-2.5 (3) MG/3ML IN SOLN: 3.0000 mL | Freq: Four times a day (QID) | RESPIRATORY_TRACT | 0 refills | Status: DC | PRN

## 2016-05-08 MED ORDER — HYDROCODONE-ACETAMINOPHEN 5-325 MG PO TABS: 1.0000 | ORAL_TABLET | Freq: Every day | ORAL | 0 refills | Status: DC | PRN

## 2016-05-08 NOTE — Progress Notes (Signed)
Physical Therapy Treatment Patient Details [name] MRN: [id number] DOB: [birth year] Today's Date: 05/08/2016    History of Present Illness 82 y.o. male presenting with weakness and shortness of breath in setting of recent URI. PMH is significant for CHF, HTN, CAD, hx of CVA with residual weakness, T2DM, anemia in the setting of angiodysplasia of stomach, history of prostate cancer    PT Comments    The pt is making progress toward his goals, but he is limited by decreased strength and endurance.  Pts O2 saturation dropped during gait training today, but would go back to baseline as soon as he returned to a seated position to rest.  Pt would benefit from further PT to address these impairments.  Continue with POC.  Follow Up Recommendations  Home health PT;Supervision/Assistance - 24 hour     Equipment Recommendations  None recommended by PT    Recommendations for Other Services       Precautions / Restrictions Precautions Precautions: Fall Precaution Comments: watch O2 saturations Restrictions Weight Bearing Restrictions: No    Mobility  Bed Mobility               General bed mobility comments: pt up in chair  Transfers Overall transfer level: Needs assistance Equipment used: Rolling walker (2 wheeled) Transfers: Sit to/from Stand Sit to Stand: Mod assist         General transfer comment: Mod A to power up from chair and verbal cues for hand placement.  Ambulation/Gait Ambulation/Gait assistance: Min guard;+2 safety/equipment (Chair follow needed.) Ambulation Distance (Feet): 220 Feet (Pt required 3 rest breaks due to his O2 saturation dropping with ambulation.) Assistive device: Rolling walker (2 wheeled) Gait Pattern/deviations: Step-through pattern;Decreased stance time - left;Decreased stance time - right;Trunk flexed Gait velocity: decreased Gait velocity interpretation: Below normal speed for age/gender General Gait Details: Verbal cues  for upright posture and to purse-lip breathe.  Pt required 3 rest breaks because his O2 saturation would drop each time he would ambulate, but then it would immediately go back up while sitting.  It range from 77-83% while ambulatiing and 97-100% while at rest.   Stairs            Wheelchair Mobility    Modified Rankin (Stroke Patients Only)       Balance     Sitting balance-[name]: Good       Standing balance-[name]: Fair                      Cognition Arousal/Alertness: Awake/alert Behavior During Therapy: WFL for tasks assessed/performed Overall Cognitive Status: Within Functional Limits for tasks assessed                      Exercises      General Comments        Pertinent Vitals/Pain Pain Assessment: No/denies pain    Home Living                      Prior Function            PT Goals (current goals can now be found in the care plan section) Acute Rehab PT Goals Patient Stated Goal: to get stronger PT Goal Formulation: With patient Time For Goal Achievement: 05/17/16 Potential to Achieve Goals: Good Progress towards PT goals: Progressing toward goals    Frequency    Min 3X/week      PT Plan  Co-evaluation             End of Session Equipment Utilized During Treatment: Gait belt Activity Tolerance: Other (comment) (Pt limited by SOB.) Patient left: in chair;with call bell/phone within reach;with chair alarm set     Time: 1241-1314 PT Time Calculation (min) (ACUTE ONLY): 33 min  Charges:  $Gait Training: 8-22 mins $Therapeutic Activity: 8-22 mins                    G Codes:      [name] 16-May-2016, 2:21 PM  Rito Ehrlich. [place], [place]

## 2016-05-08 NOTE — Progress Notes (Signed)
PT states new dx of COPD- please advise if wanting to start maintenance medication.

## 2016-05-08 NOTE — Care Management Note (Signed)
Case Management Note  Patient Details  Name: Kenneth ESMAILI MRN: [id number] Date of Birth: 05/26/1928  Subjective/Objective:                 Patient from home alone with home services and family support. Admitted with PNA, and hyponatremia. Family and patient preferring SNF at DC. CSW following for SNF placement.    Action/Plan:  Anticipate  DC to SNF when medically stable.  Expected Discharge Date:                  Expected Discharge Plan:  Skilled Nursing Facility  In-House Referral:  Clinical Social Work  Discharge planning Services  CM Consult  Post Acute Care Choice:    Choice offered to:     DME Arranged:    DME Agency:     HH Arranged:    [place] Agency:     Status of Service:  In process, will continue to follow  If discussed at Long Length of Stay Meetings, dates discussed:    Additional Comments:  Carles Collet, RN 05/08/2016, 10:56 AM

## 2016-05-08 NOTE — Progress Notes (Signed)
Patient will discharge to [facility] Anticipated discharge date: 11/29 Family notified: pt son Transportation by family Report #: [phone] Pt family to arrive between 4:30pm and 5pm to transport patient  CSW signing off.  Jorge Ny, LCSW Clinical Social Worker [phone]

## 2016-05-08 NOTE — Care Management Important Message (Signed)
Important Message  Patient Details  Name: EATHYN BREES MRN: [id number] Date of Birth: [birth year]   Medicare Important Message Given:  Yes    Coumba Kellison Abena 05/08/2016, 11:16 AM

## 2016-05-08 NOTE — Progress Notes (Signed)
Inpatient Diabetes Program Recommendations  AACE/ADA: New Consensus Statement on Inpatient Glycemic Control (2015)  Target Ranges:  Prepandial:   less than 140 mg/dL      Peak postprandial:   less than 180 mg/dL (1-2 hours)      Critically ill patients:  140 - 180 mg/dL   Lab Results  Component Value Date   GLUCAP 261 (H) 05/08/2016   HGBA1C 6.6 (H) 05/02/2016    Review of Glycemic Control:  Results for HOLLAN, SANANGELO (MRN [id number]) as of 05/08/2016 11:48  Ref. Range 05/07/2016 07:57 05/07/2016 11:55 05/07/2016 17:07 05/07/2016 20:41 05/08/2016 08:28  Glucose-Capillary Latest Ref Range: 65 - 99 mg/dL 272 (H) 315 (H) 222 (H) 288 (H) 261 (H)   Inpatient Diabetes Program Recommendations:   Please consider adding Novolog 4 units tid with meals (hold if patient eats less than 50%).  Thanks, Adah Perl, RN, BC-ADM Inpatient Diabetes Coordinator Pager [phone] (8a-5p)

## 2016-05-08 NOTE — Discharge Instructions (Signed)
You were hospitalized for difficulty breathing, and while in the hospital you were treated for pneumonia, congestive heart failure exacerbation and COPD exacerbation.  Your breathing improved over the course of your hospitalization.    - At discharge, you will still have one more day of steroids to take by mouth tomorrow.  - We will also send you on a new controller medication for COPD, which you should take every day to help your breathing.  - You will also be sent with the breathing treatments that you can receive through the nebulizer for the next few days as your breathing improves. - Your sodium level was stable, but a little low during this hospitalization, and so we would like the nursing home to do a blood test to recheck it within two days of discharge. We will give them a note with this information.    Heart Failure Heart failure means your heart has trouble pumping blood. This makes it hard for your body to work well. Heart failure is usually a long-term (chronic) condition. You must take good care of yourself and follow your doctor's treatment plan. HOME CARE  Take your heart medicine as told by your doctor.  Do not stop taking medicine unless your doctor tells you to.  Do not skip any dose of medicine.  Refill your medicines before they run out.  Take other medicines only as told by your doctor or pharmacist.  Stay active if told by your doctor. The elderly and people with severe heart failure should talk with a doctor about physical activity.  Eat heart-healthy foods. Choose foods that are without trans fat and are low in saturated fat, cholesterol, and salt (sodium). This includes fresh or frozen fruits and vegetables, fish, lean meats, fat-free or low-fat dairy foods, whole grains, and high-fiber foods. Lentils and dried peas and beans (legumes) are also good choices.  Limit salt if told by your doctor.  Cook in a healthy way. Roast, grill, broil, bake, poach, steam, or  stir-fry foods.  Limit fluids as told by your doctor.  Weigh yourself every morning. Do this after you pee (urinate) and before you eat breakfast. Write down your weight to give to your doctor.  Take your blood pressure and write it down if your doctor tells you to.  Ask your doctor how to check your pulse. Check your pulse as told.  Lose weight if told by your doctor.  Stop smoking or chewing tobacco. Do not use gum or patches that help you quit without your doctor's approval.  Schedule and go to doctor visits as told.  Nonpregnant women should have no more than 1 drink a day. Men should have no more than 2 drinks a day. Talk to your doctor about drinking alcohol.  Stop illegal drug use.  Stay current with shots (immunizations).  Manage your health conditions as told by your doctor.  Learn to manage your stress.  Rest when you are tired.  If it is really hot outside:  Avoid intense activities.  Use air conditioning or fans, or get in a cooler place.  Avoid caffeine and alcohol.  Wear loose-fitting, lightweight, and light-colored clothing.  If it is really cold outside:  Avoid intense activities.  Layer your clothing.  Wear mittens or gloves, a hat, and a scarf when going outside.  Avoid alcohol.  Learn about heart failure and get support as needed.  Get help to maintain or improve your quality of life and your ability to care for yourself  as needed. GET HELP IF:   You gain weight quickly.  You are more short of breath than usual.  You cannot do your normal activities.  You tire easily.  You cough more than normal, especially with activity.  You have any or more puffiness (swelling) in areas such as your hands, feet, ankles, or belly (abdomen).  You cannot sleep because it is hard to breathe.  You feel like your heart is beating fast (palpitations).  You get dizzy or light-headed when you stand up. GET HELP RIGHT AWAY IF:   You have trouble  breathing.  There is a change in mental status, such as becoming less alert or not being able to focus.  You have chest pain or discomfort.  You faint. MAKE SURE YOU:   Understand these instructions.  Will watch your condition.  Will get help right away if you are not doing well or get worse. This information is not intended to replace advice given to you by your health care provider. Make sure you discuss any questions you have with your health care provider. Document Released: 03/05/2008 Document Revised: 06/17/2014 Document Reviewed: 07/13/2012 Elsevier Interactive Patient Education  2017 [employer].

## 2016-05-08 NOTE — Progress Notes (Signed)
Family Medicine Teaching Service Daily Progress Note Intern Pager: [phone]  Patient [name] Medical record number: [id number] Date of birth: 1927-07-21 Age: 82 y.o. Gender: male  Primary Care Provider: [name], MD Consultants: none Code Status: DNR/DNI  Pt Overview and Major Events to Date:  11/23 - admit to [facility] for SOB and weakness  Assessment and Plan: [name] is a 82 y.o. male presenting with weakness and shortness of breath in setting of recent URI. PMH is significant for CHF, HTN, CAD, hx of CVA with residual weakness, T2DM, anemia in the setting of angiodysplasia of stomach, history of prostate cancer  Dyspnea 2/2 CHF exacerbation/?undiagnosed COPD. S/p diuresis with 40 mg IV Lasix BID for 3d followed by 40 mg IV Lasix x1 yesterday.  UOP -10.5L since admit, UOP -3.2L o/n. On exam with sparse crackles, no wheezes this AM. - patient s/p diuresis with 40 mg IV Lasix BID, improved volume status on exam - Transitioned to torsemide 20 mg qd yesterday with continued diuresis - Continue day #4 of 5 steroid burst prednisone 50 mg  - continue Duonebs TID and q6h PRN, IS - will start Spiriva controller for COPD exacerbation warranting hospitalizatino - cardiac monitoring - continuous pulse ox, supplemental oxygen as needed - daily weights and I&Os - PT- HHPT, 24h supervision, OT- no OT f/u - Pt son would like for him to go to SNF, CSW consulted.  Chronic Hyponatremia, worsened -  Likely related to volume overload in setting of CHF. Na 128 >> 122 today, corrected for hyperglycemia sodium is 125 which is near his baseline from admission. - fluid restrict and diurese as above - Continue to monitor  Possible Community Acquired Pneumonia  Afebrile, no leukocytosis. Patient sufficiently treated now s/p 5 days rocephin and azithromycin, CXR improved from admission. - duonebs, pulse ox as above  Normocytic Anemia - chronic anemia w/ hx of iron deficiency, CKD, and  recent GI bleed 2/2 angiodysplasia of stomach. S/p 1 unit pRBCs on 11/23 for Hgb 8.0 on admission. Hgb 9.0. - continue home iron supplementation - Transfusion threshold is 8 - Daily CBCs  AMS, resolved - noted 11/26 to be mentating less than baseline by family with associated increased dyspnea. Patient with normal ABG and improved CXR at that time, breathing improved with solumedrol, duonebs and lasix. - continue to monitor mental status closely  AKI on CKD3, improving - Baseline appears to be ~2.10. Now near baseline with Cr from 2.63 >>>2.07 today. - S/p judicious diuresis with Lasix 40mg  IV, transitioned to torsemide yesterday - avoid nephrotoxic medications - monitor daily  A fib- currently in a fib, rate controlled, on coreg at home. Takes aspirin daily. - continue Aspirin 81mg  daily and Coreg 6.25mg  bid - monitor on telemetry  Type 2 Diabetes-  A1C 6.6. Takes 50 units Lantus every morning and 3-7 units of Novolog TID.  CBGs have been elevated at 222-288 overnight. - continue home lantus - transition to resistant sliding scale from moderate sliding scale today - CBG AC/HS  HTN- on coreg and norvasc at home. Controlled. - continue home medications  Constipation- Resolved.  - Continue colace and miralax  Depression- stable, takes Lexapro 10mg  daily -continue home medication  Angiodysplasia of stomach, last bleed 09/2015.  On 40 mg protonix at home also on ASA. - use SCDs rather than lovenox ppx - double protonix 40 mg BID  FEN/GI: carb modified/heart healthy diet, protonix  Prophylaxis: SCDs  Disposition: discharge pending adequate diuresis. Family would  like SNF placement, CSW consulted.  Subjective:  Patient doing well this morning, in good spirits, no acute events overnight. States breathing  is improved this morning.  Objective: Temp:  [97.4 F (36.3 C)-98.1 F (36.7 C)] 97.4 F (36.3 C) (11/29 0622) Pulse Rate:  [57-66] 57 (11/29 0622) Resp:  [16-18] 18  (11/29 0622) BP: (115-122)/(53-79) 115/79 (11/29 0622) SpO2:  [96 %-100 %] 96 % (11/29 0810) Weight:  [112 kg (246 lb 14.6 oz)-112.4 kg (247 lb 12.8 oz)] 112 kg (246 lb 14.6 oz) (11/29 0503) Physical Exam: General: Pleasant, sitting up in chair and eating breakfast HEENT: NCAT, MMM Cardiovascular: RRR, no m/r/g Respiratory: Normal work of breathing, somewhat diminished breath sounds throughout all lung field but auscultate good air movement, +sparse crackles in bil mid-lung fields, without wheezes Gastrointestinal: soft, nontender, nondistended, normoactive BS MSK: 1+ edema in lower extremities L>R, moves all extremities equally; bilateral capillary refill, pulses intact  Neuro: Awake, alert, no focal deficits  Laboratory: 05/06/2016 ABG 7.418/40.8/64.6/26.0 (Normal blood gas profile)  Recent Labs Lab 05/06/16 0615 05/07/16 0511 05/08/16 0549  WBC 7.9 10.4 8.4  HGB 10.1* 9.2* 9.0*  HCT 31.1* 27.8* 27.3*  PLT 241 278 271    Recent Labs Lab 05/02/16 0926  05/06/16 0615 05/07/16 0511 05/08/16 0549  NA 128*  < > 130* 125* 122*  K 4.5  < > 4.0 4.0 4.4  CL 93*  < > 92* 88* 88*  CO2 24  < > 24 25 22   BUN 46*  < > 57* 65* 68*  CREATININE 2.44*  < > 2.23* 2.18* 2.07*  CALCIUM 8.9  < > 9.0 8.8* 8.7*  PROT 6.7  --   --   --   --   BILITOT 0.7  --   --   --   --   ALKPHOS 108  --   --   --   --   ALT 19  --   --   --   --   AST 34  --   --   --   --   GLUCOSE 165*  < > 279* 296* 301*  < > = values in this interval not displayed.  Hgb A1c 6.6  Imaging/Diagnostic Tests: Dg Chest 2 View 05/02/2016 FINDINGS: Grossly unchanged enlarged cardiac silhouette and mediastinal contours given slightly reduced lung volumes. Post median sternotomy. The pulmonary vasculature appears less distinct than present examination with cephalization of flow. Worsening bibasilar opacities, left greater than right. Interval development of small bilateral effusions, left greater than right. No  pneumothorax. No acute osseus abnormalities. IMPRESSION: Findings most suggestive of pulmonary edema with small bilateral effusions and associated bibasilar opacities, left greater than right, atelectasis versus infiltrate. Electronically Signed   By: Sandi Mariscal M.D.   On: 05/02/2016 10:28   Nm Pulmonary Perf And Vent 05/02/2016 FINDINGS: Ventilation: No focal ventilation defect. Perfusion: No wedge shaped peripheral perfusion defects to suggest acute pulmonary embolism. No perfusion-ventilation mismatches identified.  IMPRESSION: Unremarkable study - no evidence of pulmonary emboli.   Dg Chest Port 1 View  05/05/2016 FINDINGS: Trachea is midline. Heart is enlarged. Lungs are clear. No pleural fluid. IMPRESSION: No acute findings.   Dg Knee Right Port 05/05/2016 FINDINGS: Osteoarthrosis of the knee with moderate lateral femorotibial, mild medial femorotibial, and severe femorotibial compartment joint space narrowing. There fibrocystic changes patellofemoral compartment and articular sclerosis of and lateral femorotibial compartment. Small periarticular osteophytes are present. Vascular calcifications. Small suprapatellar joint effusion. No acute fracture or dislocation.  IMPRESSION: Tricompartmental osteoarthrosis of the knee greatest in the patellofemoral compartment with there is severe joint space narrowing. Small joint effusion. No acute fracture or dislocation identified.   Everrett Coombe, MD 05/08/2016, 9:56 AM PGY-1, [place] Intern pager: [phone], text pages welcome

## 2016-05-09 ENCOUNTER — Ambulatory Visit: Payer: Medicare Other | Admitting: Hematology and Oncology

## 2016-05-09 ENCOUNTER — Other Ambulatory Visit: Payer: Self-pay

## 2016-05-09 ENCOUNTER — Other Ambulatory Visit: Payer: Medicare Other

## 2016-05-09 ENCOUNTER — Encounter (HOSPITAL_COMMUNITY): Payer: Self-pay | Admitting: Cardiology

## 2016-05-09 DIAGNOSIS — N179 Acute kidney failure, unspecified: Secondary | ICD-10-CM

## 2016-05-09 DIAGNOSIS — N183 Chronic kidney disease, stage 3 (moderate): Secondary | ICD-10-CM

## 2016-05-09 DIAGNOSIS — I5033 Acute on chronic diastolic (congestive) heart failure: Secondary | ICD-10-CM

## 2016-05-09 DIAGNOSIS — I4891 Unspecified atrial fibrillation: Secondary | ICD-10-CM

## 2016-05-09 LAB — BASIC METABOLIC PANEL
ANION GAP: 8 (ref 5–15)
BUN: 72 mg/dL — AB (ref 6–20)
CHLORIDE: 90 mmol/L — AB (ref 101–111)
CO2: 25 mmol/L (ref 22–32)
Calcium: 8.7 mg/dL — ABNORMAL LOW (ref 8.9–10.3)
Creatinine, Ser: 2.2 mg/dL — ABNORMAL HIGH (ref 0.61–1.24)
GFR, EST AFRICAN AMERICAN: 29 mL/min — AB (ref >60)
GFR, EST NON AFRICAN AMERICAN: 25 mL/min — AB (ref >60)
Glucose, Bld: 195 mg/dL — ABNORMAL HIGH (ref 65–99)
POTASSIUM: 4.4 mmol/L (ref 3.5–5.1)
SODIUM: 123 mmol/L — AB (ref 135–145)

## 2016-05-09 LAB — CBC
HCT: 26.5 % — ABNORMAL LOW (ref 39.0–52.0)
HEMOGLOBIN: 8.8 g/dL — AB (ref 13.0–17.0)
MCH: 27.7 pg (ref 26.0–34.0)
MCHC: 33.2 g/dL (ref 30.0–36.0)
MCV: 83.3 fL (ref 78.0–100.0)
PLATELETS: 303 10*3/uL (ref 150–400)
RBC: 3.18 MIL/uL — AB (ref 4.22–5.81)
RDW: 14.2 % (ref 11.5–15.5)
WBC: 8.6 10*3/uL (ref 4.0–10.5)

## 2016-05-09 LAB — GLUCOSE, CAPILLARY
GLUCOSE-CAPILLARY: 123 mg/dL — AB (ref 65–99)
GLUCOSE-CAPILLARY: 126 mg/dL — AB (ref 65–99)
GLUCOSE-CAPILLARY: 107 mg/dL — AB (ref 65–99)
Glucose-Capillary: 167 mg/dL — ABNORMAL HIGH (ref 65–99)
Glucose-Capillary: 213 mg/dL — ABNORMAL HIGH (ref 65–99)

## 2016-05-09 MED ORDER — HYDROCODONE-ACETAMINOPHEN 5-325 MG PO TABS: 1.0000 | ORAL_TABLET | Freq: Every day | ORAL | 0 refills | Status: DC | PRN

## 2016-05-09 MED ORDER — FUROSEMIDE 10 MG/ML IJ SOLN
40.0000 mg | Freq: Two times a day (BID) | INTRAMUSCULAR | Status: DC
  Administered 2016-05-09 – 2016-05-10 (×4): 40 mg via INTRAVENOUS
  Filled 2016-05-09 (×4): qty 4

## 2016-05-09 MED ORDER — HYDROCODONE-ACETAMINOPHEN 5-325 MG PO TABS
1.0000 | ORAL_TABLET | Freq: Every day | ORAL | Status: DC | PRN
  Administered 2016-05-09 – 2016-05-11 (×3): 1 via ORAL
  Filled 2016-05-09 (×3): qty 1

## 2016-05-09 NOTE — Clinical Social Work Note (Signed)
CSW signing on as pt did not discharge to [facility] yesterday. Per facility, pt's bed is available when stable. CSW will continue to follow.   Darden Dates, MSW, LCSW  Clinical Social Worker  [phone]

## 2016-05-09 NOTE — Care Management Note (Addendum)
Case Management Note  Patient Details  Name: DENSON JULIAN MRN: [id number] Date of Birth: 14-Jan-1928  Subjective/Objective: Pt transferred from 86 [address]. Presented for SOB- PNA. IV Lasix changed to Torsemide. Plan will be for SNF once stable. CSW aware and following.                      Action/Plan: CM will continue to monitor for additional needs.   Expected Discharge Date:                  Expected Discharge Plan:  Skilled Nursing Facility  In-House Referral:  Clinical Social Work  Discharge planning Services  CM Consult  Post Acute Care Choice:  NA Choice offered to:  NA  DME Arranged:  N/A DME Agency:  NA  HH Arranged:  NA HH Agency:  NA  Status of Service:  Completed, signed off  If discussed at [employer] of Stay Meetings, dates discussed:  05-14-16, 05-16-16  Additional Comments: 1256 05-15-16 Jacqlyn Krauss, RN, BSN [phone] CM did speak with MD in regards to disposition. Plan will be to transition to po lasix today and d/cancer to [facility] on 05-16-16.    1506 05-14-16 Jacqlyn Krauss, RN,BSN [phone] IV Lasix to be changed to po. Plan will be for SNF once stable.  Bethena Roys, RN 05/09/2016, 2:23 PM
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
## 2016-05-09 NOTE — Progress Notes (Signed)
Family Medicine Teaching Service Daily Progress Note Intern Pager: [phone]  Patient [name] Medical record number: [id number] Date of birth: 06/19/1927 Age: 82 y.o. Gender: male  Primary Care Provider: [name], MD Consultants: none Code Status: DNR/DNI  Pt Overview and Major Events to Date:  11/23 - admit to [facility] for SOB and weakness  Assessment and Plan: [name] is a 82 y.o. male presenting with weakness and shortness of breath in setting of recent URI. PMH is significant for CHF, HTN, CAD, hx of CVA with residual weakness, T2DM, anemia in the setting of angiodysplasia of stomach, history of prostate cancer  Dyspnea 2/2 CHF exacerbation/?undiagnosed COPD. S/p diuresis with 40 mg IV Lasix BID for 3d followed by 40 mg IV Lasix x1 yesterday.  UOP -12.3 since admit. Patient considered stable for discharge yesterday, however still with some LE edema and noted to be mentating below baseline yesterday afternoon by family, so kept in hospital overnight for HF consult. On exam with sparse crackles, no wheezes this AM.   - discontinued amlodipine 5 mg due to side effect of LE edema - patient s/p diuresis with 40 mg IV Lasix BID, improved volume status on exam - Transitioned to torsemide 20 mg qd, UOP -1.6L overnight - Continue day #5/5 to complete steroid burst prednisone 50 mg  - Duonebs BID and q6h PRN, IS - started Spiriva controller for COPD exacerbation warranting hospitalization - cardiac monitoring - daily weights and I&Os - consult heart failure team today - PT- HHPT, 24h supervision, OT- no OT f/u - Pt son would like for him to go to SNF, CSW consulted.  Chronic Hyponatremia, worsened -  Likely related to volume overload in setting of CHF. Na 128 on admission. Today Na 123, corrected for hyperglycemia sodium is 124.   - fluid restrict and diurese as above - Continue to monitor  Possible Community Acquired Pneumonia  Afebrile, no leukocytosis. Patient  sufficiently treated now s/p 5 days rocephin and azithromycin, CXR improved from admission. - duonebs as above  Normocytic Anemia - chronic anemia w/ hx of iron deficiency, CKD, and recent GI bleed 2/2 angiodysplasia of stomach. S/p 1 unit pRBCs on 11/23 for Hgb 8.0 on admission. Hgb 8.8 today. - continue home iron supplementation - Transfusion threshold is 8 - Daily CBCs  AMS, resolved - noted 11/26 to be mentating less than baseline by family with associated increased dyspnea. Patient with normal ABG and improved CXR at that time, breathing improved with solumedrol, duonebs and lasix. - continue to monitor mental status closely  AKI on CKD3, improving - Baseline appears to be ~2.10. Now near baseline with Cr from 2.63 >>>2.07>>2.20 today. - S/p judicious diuresis with Lasix 40mg  IV, transitioned to torsemide 11/28 - avoid nephrotoxic medications - monitor daily  A fib- currently in a fib, rate controlled, on coreg at home. Takes aspirin daily. - continue Aspirin 81mg  daily and Coreg 6.25mg  bid - monitor on telemetry  Type 2 Diabetes-  A1C 6.6. Takes 50 units Lantus every morning and 3-7 units of Novolog TID.  CBGs have been elevated at 192,172 overnight. - continue home lantus - transition to resistant sliding scale from moderate sliding scale today - CBG AC/HS  HTN- on coreg and norvasc at home. Controlled. - holding norvasc due to LE edema - continue home medications  Constipation- Resolved.  - Continue colace and miralax  Depression- stable, takes Lexapro 10mg  daily -continue home medication  Angiodysplasia of stomach, last bleed 09/2015.  On 40 mg protonix at home also on ASA. - use SCDs rather than lovenox ppx - double protonix 40 mg BID  FEN/GI: carb modified/heart healthy diet, protonix  Prophylaxis: SCDs  Disposition: discharge pending adequate diuresis. Family would like SNF placement, CSW consulted.  Subjective:  Patient feeling well this AM. Seems to be  mentating clearly. Feels breathing is improved. No acute events overnight, no complaints.  Objective: Temp:  [97.9 F (36.6 C)-98.4 F (36.9 C)] 98.1 F (36.7 C) (11/30 0452) Pulse Rate:  [58-72] 69 (11/30 0452) Resp:  [18-24] 24 (11/30 0452) BP: (121-160)/(44-65) 156/57 (11/30 0452) SpO2:  [97 %-100 %] 98 % (11/30 0452) Weight:  [245 lb 1.6 oz (111.2 kg)] 245 lb 1.6 oz (111.2 kg) (11/30 0452) Physical Exam: General: Pleasant, sitting up in chair and eating breakfast HEENT: NCAT, MMM Cardiovascular: RRR, no m/r/g Respiratory: Normal work of breathing, +dry cough on exam, unable to auscultate very well today  Gastrointestinal: soft, nontender, nondistended, normoactive BS MSK: 1-2+ edema LE bil, chronic skin changes, moves all extremities equally; bilateral capillary refill, pulses intact  Neuro: Awake, alert, no focal deficits  Laboratory: 05/06/2016 ABG 7.418/40.8/64.6/26.0 (Normal blood gas profile)  Recent Labs Lab 05/07/16 0511 05/08/16 0549 05/09/16 0333  WBC 10.4 8.4 8.6  HGB 9.2* 9.0* 8.8*  HCT 27.8* 27.3* 26.5*  PLT 278 271 303    Recent Labs Lab 05/08/16 0549 05/08/16 1413 05/09/16 0333  NA 122* 123* 123*  K 4.4 4.6 4.4  CL 88* 88* 90*  CO2 22 24 25   BUN 68* 68* 72*  CREATININE 2.07* 2.18* 2.20*  CALCIUM 8.7* 8.6* 8.7*  GLUCOSE 301* 184* 195*    Hgb A1c 6.6  Imaging/Diagnostic Tests: Dg Chest 2 View 05/02/2016 FINDINGS: Grossly unchanged enlarged cardiac silhouette and mediastinal contours given slightly reduced lung volumes. Post median sternotomy. The pulmonary vasculature appears less distinct than present examination with cephalization of flow. Worsening bibasilar opacities, left greater than right. Interval development of small bilateral effusions, left greater than right. No pneumothorax. No acute osseus abnormalities. IMPRESSION: Findings most suggestive of pulmonary edema with small bilateral effusions and associated bibasilar opacities, left  greater than right, atelectasis versus infiltrate. Electronically Signed   By: Sandi Mariscal M.D.   On: 05/02/2016 10:28   Nm Pulmonary Perf And Vent 05/02/2016 FINDINGS: Ventilation: No focal ventilation defect. Perfusion: No wedge shaped peripheral perfusion defects to suggest acute pulmonary embolism. No perfusion-ventilation mismatches identified.  IMPRESSION: Unremarkable study - no evidence of pulmonary emboli.   Dg Chest Port 1 View  05/05/2016 FINDINGS: Trachea is midline. Heart is enlarged. Lungs are clear. No pleural fluid. IMPRESSION: No acute findings.   Dg Knee Right Port 05/05/2016 FINDINGS: Osteoarthrosis of the knee with moderate lateral femorotibial, mild medial femorotibial, and severe femorotibial compartment joint space narrowing. There fibrocystic changes patellofemoral compartment and articular sclerosis of and lateral femorotibial compartment. Small periarticular osteophytes are present. Vascular calcifications. Small suprapatellar joint effusion. No acute fracture or dislocation. IMPRESSION: Tricompartmental osteoarthrosis of the knee greatest in the patellofemoral compartment with there is severe joint space narrowing. Small joint effusion. No acute fracture or dislocation identified.   Everrett Coombe, MD 05/09/2016, 9:48 AM PGY-1, [place] Intern pager: [phone], text pages welcome

## 2016-05-09 NOTE — Telephone Encounter (Signed)
Rx faxed to [facility] @ [phone], phone number [phone]  

## 2016-05-09 NOTE — Progress Notes (Signed)
Occupational Therapy Treatment Patient Details [name] MRN: [id number] DOB: 1928/05/09 Today's Date: 05/09/2016    History of present illness 82 y.o. male presenting with weakness and shortness of breath in setting of recent URI. PMH is significant for CHF, HTN, CAD, hx of CVA with residual weakness, T2DM, anemia in the setting of angiodysplasia of stomach, history of prostate cancer   OT comments  Pt with limited participation in therapy today; keeps reporting "Ill do it when I get my bearings". Pt noted to have increased confusion today but was woken up with OT arrival; RN notified. Pt required total assist for donning socks and min assist for urinal placement at bed level. Plan is now for pt to d/c to SNF prior to return home; updated d/c recommendations to reflect. Will continue to follow acutely.   Follow Up Recommendations  SNF;Supervision/Assistance - 24 hour    Equipment Recommendations  Other (comment) (TBD)    Recommendations for Other Services      Precautions / Restrictions Precautions Precautions: Fall Precaution Comments: watch O2 saturations Restrictions Weight Bearing Restrictions: No       Mobility Bed Mobility Overal bed mobility: Needs Assistance Bed Mobility: Supine to Sit     Supine to sit: Min guard;HOB elevated     General bed mobility comments: Min guard for safety with increased time. HOB elevated with use of bed rail.  Transfers                      Balance Overall balance assessment: Needs assistance Sitting-balance support: Feet supported;No upper extremity supported Sitting balance-[name]: Good                             ADL Overall ADL's : Needs assistance/impaired Eating/Feeding: Set up;Sitting                   Lower Body Dressing: Total assistance Lower Body Dressing Details (indicate cue type and reason): total assist for donning socks     Toileting- Clothing Manipulation and  Hygiene: Minimal assistance;Bed level Toileting - Clothing Manipulation Details (indicate cue type and reason): Min assist to place urinal at bed level.       General ADL Comments: Pt reports "I will get up once I get my bearings"; unable to convince pt to participate in activities despite max verbal encouragement. Increased confusion noted but did wake pt up upon arrival (RN notified).      Vision                     Perception     Praxis      Cognition   Behavior During Therapy: WFL for tasks assessed/performed Overall Cognitive Status: Impaired/Different from baseline Area of Impairment: Orientation;Memory Orientation Level: Disoriented to;Time   Memory: Decreased short-term memory          General Comments: Pt thinking it was morning time even after reoriented to 5pm. Pt thinking therapist was family/friend.    Extremity/Trunk Assessment               Exercises     Shoulder Instructions       General Comments      Pertinent Vitals/ Pain       Pain Assessment: No/denies pain  Home Living  Prior Functioning/Environment              Frequency  Min 2X/week        Progress Toward Goals  OT Goals(current goals can now be found in the care plan section)  Progress towards OT goals: Not progressing toward goals - comment (limited by fatigue today)  Acute Rehab OT Goals Patient Stated Goal: to get stronger OT Goal Formulation: With patient  Plan Discharge plan needs to be updated    Co-evaluation                 End of Session     Activity Tolerance Patient limited by fatigue   Patient Left in bed;with call bell/phone within reach;with bed alarm set (sitting EOB)   Nurse Communication Other (comment) (pt with increased confusion?)        Time: 1645-1710 OT Time Calculation (min): 25 min  Charges: OT General Charges $OT Visit: 1 Procedure OT  Treatments $Self Care/Home Management : 23-37 mins  Binnie Kand M.S., OTR/L Pager: [phone]  05/09/2016, 5:20 PM

## 2016-05-09 NOTE — Discharge Summary (Signed)
[facility] Discharge Summary  Patient [name] Medical record number: [id number] Date of birth: [birth year] Age: 82 y.o. Gender: male Date of Admission: 05/02/2016  Date of Discharge: 05/16/16  Admitting Physician: Kinnie Feil, MD  Primary Care Provider: [name], MD Consultants: cardiology   Indication for Hospitalization: Weakness, shortness of breath  Discharge Diagnoses/Problem List:  CAP Acute on chronic HFpEF AKI on CKD III Chronic anemia A-fib T2DM HTN Aortic stenosis Hx CVA w/ left sided weakness  Disposition: SNF  Discharge Condition: Stable/Improved   Discharge Exam:  Temp: [97.4 F (36.3 C)-98.1 F (36.7 C)] 97.4 F (36.3 C) (11/29 0622) Pulse Rate: [57-66] 57 (11/29 0622) Resp: [16-18] 18 (11/29 0622) BP: (115-122)/(53-79) 115/79 (11/29 0622) SpO2: [96 %-100 %] 96 % (11/29 0810) Weight: [112 kg (246 lb 14.6 oz)-112.4 kg (247 lb 12.8 oz)] 112 kg (246 lb 14.6 oz) (11/29 0503) Physical Exam: General: Pleasant, sitting up in chair andeating breakfast HEENT: NCAT, MMM Cardiovascular: RRR, no m/r/g Respiratory: Normal work of breathing, no wheezes or crackles Gastrointestinal: soft, nontender, nondistended, normoactive BS MSK: 1+ edema in bilateral feet, moves all extremities equally; bilateral capillary refill, pulses intact  Neuro: Awake, alert, no focal deficits  Brief Hospital Course:  [name] is an 82 year old male who presented to the ED with weakness, cough, and SOB. In the ED, he was afebrile. No leukocytosis. He was not meeting SIRS criteria. CXR showed evidence of pulmonary edema and interstitial infiltrates L > R, concerning for pneumonia. V/Q scan was performed and was negative for PE. He was admitted for further management. Hospital course is described by problem below.  Shortness of Breath: Likely secondary to a combination of CAP and CHF exacerbation. For the CAP, he received  Ceftriaxone x 4 days and Azithromycin x 5 days. Urine strep pneumo and urine legionella were negative. For the CHF exacerbation, he received Lasix 40mg  IV bid for 4 days and was transitioned to torsemide 20 mg PO for better bioavailability than PO lasix. Transitioned back to IV lasix 40mg  BID x 3 additional days, then responded well to 80mg  QD lasix PO on day prior to discharge. We also thought he possibly had undiagnosed COPD because he was noted to have poor air movement throughout all lung fields as well as expiratory wheezing. We treated him with Duonebs and Solumedrol 125mg  x 1. Patient was also discharged with spiriva controller given exacerbation requiring hospitalization. He would likely benefit from outpatient PFTs to assess this further.  Normocytic Anemia: Pt has history of chronic anemia secondary to iron deficiency, CKD, and a recent GI bleed due to angiodysplasia of the stomach. Hgb was 8.0 on admission and Pt received 1 unit pRBCs. His Hgb stabilized after the blood transfusion. Hgb was stable at 8.9 on the day of discharge.  AKI on CKD III: Pt was noted to have an elevated Cr on admission to 2.44. Cr increased to 2.63 and then trended down to 2.33.  Issues for Follow Up:  1. Recommend outpatient PFTs, as pt was noted to have wheezing and shortness of breath that improved with duonebs. He has a history of smoking a pipe and his wife was a heavy smoker. He was discharged with spiriva inhaler to use once daily as a controller. 2. Hyponatremic chronically, Na 132 at discharge. Please repeat BMET. 3. Patient previously on Lasix 40 mg daily, discharged on lasix 80mg  QD. 4. If LE edema continues to be a problem, consider additional etiologies  such as thyroid, protein calorie malnutrition. 5. HgbA1C shows well controlled DM at 6.6, may not need Lantus + sliding scale at home, would be concerned for hypoglycemia. Although son reports that patient eats more than he should at home, hence the need  for sliding scale.   Significant Procedures: None  Significant Labs and Imaging:   Recent Labs Lab 05/14/16 0420 05/15/16 0433 05/16/16 0329  WBC 7.3 6.6 7.0  HGB 8.7* 8.9* 8.9*  HCT 27.2* 28.0* 27.6*  PLT 335 324 286    Recent Labs Lab 05/11/16 0921 05/12/16 0418 05/13/16 0435 05/14/16 0420 05/15/16 0433 05/16/16 0329  NA  --  130* 131* 130* 132* 132*  K  --  4.2 3.4* 4.0 4.3 4.2  CL  --  91* 92* 90* 90* 92*  CO2  --  30 30 30 29 29   GLUCOSE  --  194* 71 159* 288* 78  BUN  --  66* 65* 67* 66* 63*  CREATININE  --  2.40* 2.33* 2.42* 2.48* 2.33*  CALCIUM  --  8.4* 8.4* 8.6* 8.8* 8.9  MG 2.0  --   --   --   --   --     Results/Tests Pending at Time of Discharge: none  Discharge Medications:    Medication List    STOP taking these medications   amLODipine 5 MG tablet Commonly known as:  NORVASC   HYDROcodone-acetaminophen 5-325 MG tablet Commonly known as:  NORCO/VICODIN     TAKE these medications   acetaminophen 325 MG tablet Commonly known as:  TYLENOL Take 2 tablets (650 mg total) by mouth every 6 (six) hours as needed for mild pain (or Fever >/= 101).   aspirin EC 81 MG tablet Take 1 tablet (81 mg total) by mouth daily.   calcitRIOL 0.25 MCG capsule Commonly known as:  ROCALTROL TAKE ONE CAPSULE THREE TIMES A WEEK (MONDAY, WEDNESDAY, AND FRIDAY)   carvedilol 6.25 MG tablet Commonly known as:  COREG Take 1 tablet (6.25 mg total) by mouth 2 (two) times daily.   cholecalciferol 1000 units tablet Commonly known as:  VITAMIN D Take 1,000 Units by mouth daily.   escitalopram 10 MG tablet Commonly known as:  LEXAPRO TAKE 1 TABLET EVERY DAY WITH DINNER   ferrous sulfate 325 (65 FE) MG tablet Take 325 mg by mouth daily with breakfast.   furosemide 80 MG tablet Commonly known as:  LASIX Take 1 tablet (80 mg total) by mouth daily. Start taking on:  05/17/2016 What changed:  medication strength  how much to take   hydroxychloroquine 200 MG  tablet Commonly known as:  PLAQUENIL Take 200 mg by mouth daily.   insulin aspart 100 UNIT/ML injection Commonly known as:  novoLOG Inject 0-9 Units into the skin 3 (three) times daily with meals. What changed:  how much to take  when to take this  reasons to take this  additional instructions   insulin aspart 100 UNIT/ML injection Commonly known as:  novoLOG Inject 0-9 Units into the skin at bedtime. What changed:  You were already taking a medication with the same name, and this prescription was added. Make sure you understand how and when to take each.   insulin glargine 100 UNIT/ML injection Commonly known as:  LANTUS Inject 50 Units into the skin every morning. What changed:  Another medication with the same name was added. Make sure you understand how and when to take each.   insulin glargine 100 UNIT/ML injection Commonly known as:  LANTUS Inject 0.4 mLs (40 Units total) into the skin every morning. Start taking on:  05/17/2016 What changed:  You were already taking a medication with the same name, and this prescription was added. Make sure you understand how and when to take each.   isosorbide mononitrate 30 MG 24 hr tablet Commonly known as:  IMDUR Take 30 mg by mouth daily.   multivitamin with minerals Tabs tablet Take 1 tablet by mouth daily. What changed:  Another medication with the same name was added. Make sure you understand how and when to take each.   multivitamin with minerals Tabs tablet Take 1 tablet by mouth 2 (two) times daily. What changed:  You were already taking a medication with the same name, and this prescription was added. Make sure you understand how and when to take each.   pantoprazole 40 MG tablet Commonly known as:  PROTONIX Take 1 tablet (40 mg total) by mouth daily.   PRESERVISION AREDS 2 Caps Take 1 capsule by mouth 2 (two) times daily.   tamsulosin 0.4 MG Caps capsule Commonly known as:  FLOMAX TAKE 1 CAPSULE BY MOUTH DAILY  AFTER SUPPER   tiotropium 18 MCG inhalation capsule Commonly known as:  SPIRIVA Place 1 capsule (18 mcg total) into inhaler and inhale daily.   vitamin C 500 MG tablet Commonly known as:  ASCORBIC ACID Take 500 mg by mouth daily.       Discharge Instructions: Please refer to Patient Instructions section of EMR for full details.  Patient was counseled important signs and symptoms that should prompt return to medical care, changes in medications, dietary instructions, activity restrictions, and follow up appointments.   Follow-Up Appointments: Contact information for after-discharge care    Destination    [facility] .   Specialty:  [place] Contact information: [address] [place]              Sela Hilding, MD 05/16/2016, 11:16 AM PGY-1, [place]

## 2016-05-10 DIAGNOSIS — I5023 Acute on chronic systolic (congestive) heart failure: Secondary | ICD-10-CM

## 2016-05-10 LAB — GLUCOSE, CAPILLARY
GLUCOSE-CAPILLARY: 240 mg/dL — AB (ref 65–99)
Glucose-Capillary: 198 mg/dL — ABNORMAL HIGH (ref 65–99)
Glucose-Capillary: 177 mg/dL — ABNORMAL HIGH (ref 65–99)
Glucose-Capillary: 218 mg/dL — ABNORMAL HIGH (ref 65–99)

## 2016-05-10 LAB — CBC
HCT: 28.1 % — ABNORMAL LOW (ref 39.0–52.0)
Hemoglobin: 9.1 g/dL — ABNORMAL LOW (ref 13.0–17.0)
MCH: 27.2 pg (ref 26.0–34.0)
MCHC: 32.4 g/dL (ref 30.0–36.0)
MCV: 83.9 fL (ref 78.0–100.0)
PLATELETS: 324 10*3/uL (ref 150–400)
RBC: 3.35 MIL/uL — ABNORMAL LOW (ref 4.22–5.81)
RDW: 14.3 % (ref 11.5–15.5)
WBC: 7.2 10*3/uL (ref 4.0–10.5)

## 2016-05-10 LAB — BASIC METABOLIC PANEL
ANION GAP: 11 (ref 5–15)
BUN: 69 mg/dL — ABNORMAL HIGH (ref 6–20)
CALCIUM: 9 mg/dL (ref 8.9–10.3)
CO2: 27 mmol/L (ref 22–32)
CREATININE: 2.13 mg/dL — AB (ref 0.61–1.24)
Chloride: 92 mmol/L — ABNORMAL LOW (ref 101–111)
GFR calc Af Amer: 31 mL/min — ABNORMAL LOW (ref >60)
GFR, EST NON AFRICAN AMERICAN: 26 mL/min — AB (ref >60)
GLUCOSE: 260 mg/dL — AB (ref 65–99)
Potassium: 3.9 mmol/L (ref 3.5–5.1)
Sodium: 130 mmol/L — ABNORMAL LOW (ref 135–145)

## 2016-05-10 MED ORDER — INSULIN ASPART 100 UNIT/ML ~~LOC~~ SOLN
0.0000 [IU] | Freq: Three times a day (TID) | SUBCUTANEOUS | Status: DC
  Administered 2016-05-10: 3 [IU] via SUBCUTANEOUS
  Administered 2016-05-10: 5 [IU] via SUBCUTANEOUS
  Administered 2016-05-11: 8 [IU] via SUBCUTANEOUS
  Administered 2016-05-11: 3 [IU] via SUBCUTANEOUS
  Administered 2016-05-12: 5 [IU] via SUBCUTANEOUS
  Administered 2016-05-12: 3 [IU] via SUBCUTANEOUS

## 2016-05-10 NOTE — Progress Notes (Signed)
Family Medicine Teaching Service Daily Progress Note Intern Pager: [phone]  Patient [name] Medical record number: [id number] Date of birth: 07-26-1927 Age: 82 y.o. Gender: male  Primary Care Provider: [name], MD Consultants: none Code Status: DNR/DNI  Pt Overview and Major Events to Date:  11/23 - admit to FPTS for SOB and weakness. Ruled out PE with VQ scan, LE dopplers no DVT. Rocephin azithro started for presumed CAP. 11/24 - transfused 1U PRBCs > hgb 9.0 11/26 - considered transitioning to PO abx, but allergic to augmentin, so will plan for [facility] as outpatient. 1 episode of ? Decreased alertness. Added back duonebs and gave IV lasix 40mg  x 1, as well as solumedrol 125mg  x 1 11/27 - abx course completed. 11/28 - added Spiriva 11/30 - steroid course completed. HF team consulted for additional diuresis.   Assessment and Plan: [name] is a 82 y.o. male presenting with weakness and shortness of breath in setting of recent URI treated as CAP with concomitant CHF exacerbation. PMH is significant for CHF, HTN, CAD, hx of CVA with residual weakness, T2DM, anemia in the setting of angiodysplasia of stomach, history of prostate cancer.   Dyspnea 2/2 CHF exacerbation/?undiagnosed COPD. S/p diuresis with 40 mg IV Lasix BID for 3d followed by 40 mg IV Lasix x1 yesterday.  UOP -12.3 since admit, out 3.9L yesterday. HF consulted 11/30 for additional diuresis. On exam 2+ pitting edema on dorsal feet and sacrum.  - HF team consulted, appreciate recs - per HF team, continue 40 mg IV Lasix BID - Duonebs q6h PRN, IS - continue Spiriva controller for COPD exacerbation warranting hospitalization - cardiac monitoring - daily weights and I&Os - PT- HHPT, 24h supervision, OT- no OT f/u - Pt son would like for him to go to SNF, CSW aware.  Chronic Hyponatremia, improving -  Likely related to volume overload in setting of CHF. Na 128 on admission. Today Na 130, corrected for  hyperglycemia sodium is 133.   - fluid restrict and diurese as above - Continue to monitor  Possible Community Acquired Pneumonia, resolved.  Afebrile, no leukocytosis. Patient completed treatment, s/p 5 days rocephin and azithromycin, CXR improved from admission. - duonebs changed to PRN  Normocytic Anemia - chronic anemia w/ hx of iron deficiency, CKD, and recent GI bleed 2/2 angiodysplasia of stomach. S/p 1 unit pRBCs on 11/23 for Hgb 8.0 on admission. Hgb 9.1 today. - continue home iron supplementation - Transfusion threshold is 8 - Daily CBCs  AMS, resolved - noted 11/26 to be mentating less than baseline by family with associated increased dyspnea. Patient with normal ABG and improved CXR at that time, breathing improved with solumedrol, duonebs and lasix. - continue to monitor mental status closely  AKI on CKD3, improving - Baseline appears to be ~2.10. Now near baseline with Cr from 2.63 >>>2.07>>2.13 today. - S/p judicious diuresis with Lasix 40mg  IV BID, per HF team  - avoid nephrotoxic medications - monitor daily  A fib- currently in a fib, rate controlled, on coreg at home. Takes aspirin daily. - continue Aspirin 81mg  daily and Coreg 6.25mg  bid - monitor on telemetry  Type 2 Diabetes-  A1C 6.6. Takes 50 units Lantus every morning and 3-7 units of Novolog TID.  CBGs have been 107, 213 overnight. - continue home lantus - changed back to moderate sliding scale insulin as steroids should be having decreasing effect on CBGs - CBG AC/HS  HTN- on coreg and norvasc at home. Controlled. -  holding norvasc due to LE edema - continue home medications  Constipation- Resolved.  - Continue colace and miralax  Depression- stable, takes Lexapro 10mg  daily -continue home medication  Angiodysplasia of stomach, last bleed 09/2015.  On 40 mg protonix at home also on ASA. - use SCDs rather than lovenox ppx - protonix 40 mg BID  FEN/GI: carb modified/heart healthy diet, protonix   Prophylaxis: SCDs  Disposition: discharge pending adequate diuresis. Family would like SNF placement, CSW consulted.  Subjective:  Patient feels much better today. He is enjoying breakfast, but not enjoying his fluid restriction.   Objective: Temp:  [97.5 F (36.4 C)-97.9 F (36.6 C)] 97.9 F (36.6 C) (12/01 1428) Pulse Rate:  [65-78] 65 (12/01 1428) Resp:  [14-22] 18 (12/01 1428) BP: (132-163)/(58-76) 132/74 (12/01 1428) SpO2:  [96 %-100 %] 96 % (12/01 1428) Weight:  [235 lb 9.6 oz (106.9 kg)] 235 lb 9.6 oz (106.9 kg) (12/01 0617) Physical Exam:  General: Pleasant, sitting up in chair and eating breakfast HEENT: NCAT, MMM Cardiovascular: RRR, no m/r/g Respiratory: Normal work of breathing, +dry cough on exam, unable to auscultate very well today  Gastrointestinal: soft, nontender, nondistended, normoactive BS MSK: 1-2+ edema LE bil over dorsal feet and sacrum, chronic skin changes, moves all extremities equally; bilateral capillary refill, pulses intact  Neuro: Awake, alert, no focal deficits  Laboratory: 05/06/2016 ABG 7.418/40.8/64.6/26.0 (Normal blood gas profile)  Recent Labs Lab 05/08/16 0549 05/09/16 0333 05/10/16 0255  WBC 8.4 8.6 7.2  HGB 9.0* 8.8* 9.1*  HCT 27.3* 26.5* 28.1*  PLT 271 303 324    Recent Labs Lab 05/08/16 1413 05/09/16 0333 05/10/16 0255  NA 123* 123* 130*  K 4.6 4.4 3.9  CL 88* 90* 92*  CO2 24 25 27   BUN 68* 72* 69*  CREATININE 2.18* 2.20* 2.13*  CALCIUM 8.6* 8.7* 9.0  GLUCOSE 184* 195* 260*    Hgb A1c 6.6  Imaging/Diagnostic Tests: No new imaging today.   Sela Hilding, MD 05/10/2016, 3:47 PM PGY-1, [place] Intern pager: [phone], text pages welcome

## 2016-05-10 NOTE — Progress Notes (Signed)
Physical Therapy Treatment Patient Details [name] MRN: [id number] DOB: 10/09/1927 Today's Date: 05/10/2016    History of Present Illness 82 y.o. male presenting with weakness and shortness of breath in setting of recent URI. PMH is significant for CHF, HTN, CAD, hx of CVA with residual weakness, T2DM, anemia in the setting of angiodysplasia of stomach, history of prostate cancer    PT Comments    Pt eager for ambulation, but continues to need seated rest breaks due to fatigue.  Pt's O2 sats decrease to 89% on RA during ambulation and very quickly increase to upper 90's during seated rest.  Pt denies feeling SOB.  Feel pt would benefit from continued therapies to maximize independence prior to returning to home.  Will continue to follow.    Follow Up Recommendations  SNF     Equipment Recommendations  None recommended by PT    Recommendations for Other Services       Precautions / Restrictions Precautions Precautions: Fall Precaution Comments: watch O2 saturations Restrictions Weight Bearing Restrictions: No    Mobility  Bed Mobility               General bed mobility comments: pt sitting in recliner.  Transfers Overall transfer level: Needs assistance Equipment used: Rolling walker (2 wheeled) Transfers: Sit to/from Stand Sit to Stand: Min assist         General transfer comment: MinA for balance.  pt able to power self up to standing.    Ambulation/Gait Ambulation/Gait assistance: Min assist Ambulation Distance (Feet): 80 Feet (and 40 and 40) Assistive device: Rolling walker (2 wheeled) Gait Pattern/deviations: Step-through pattern;Decreased stride length;Trunk flexed     General Gait Details: pt moves slowly and needs cues for mroe upright posture and staying closer to RW.  pt fatigues during ambulation and needs 2 seated rest breaks during gait, but is then eager to get back up and continue ambulating.     Stairs             Wheelchair Mobility    Modified Rankin (Stroke Patients Only)       Balance Overall balance assessment: Needs assistance Sitting-balance support: No upper extremity supported;Feet supported Sitting balance-[name]: Good     Standing balance support: Bilateral upper extremity supported;Single extremity supported;During functional activity Standing balance-[name]: Poor                      Cognition Arousal/Alertness: Awake/alert Behavior During Therapy: WFL for tasks assessed/performed Overall Cognitive Status: Within Functional Limits for tasks assessed                      Exercises      General Comments        Pertinent Vitals/Pain Pain Assessment: No/denies pain    Home Living                      Prior Function            PT Goals (current goals can now be found in the care plan section) Acute Rehab PT Goals Patient Stated Goal: to get stronger PT Goal Formulation: With patient Time For Goal Achievement: 05/17/16 Potential to Achieve Goals: Good Progress towards PT goals: Progressing toward goals    Frequency    Min 3X/week      PT Plan Discharge plan needs to be updated    Co-evaluation  End of Session Equipment Utilized During Treatment: Gait belt Activity Tolerance: Patient tolerated treatment well Patient left: in chair;with call bell/phone within reach;with chair alarm set;with family/visitor present     Time: [id number] PT Time Calculation (min) (ACUTE ONLY): 24 min  Charges:  $Gait Training: 23-37 mins                    G CodesCatarina Hartshorn, [place]  [phone] 05/10/2016, 3:14 PM

## 2016-05-10 NOTE — Progress Notes (Signed)
Patient [name] Date of Encounter: 05/10/2016  Primary Cardiologist: Dr. Rolly Pancake Problem List     Active Problems:   Pneumonia   Shortness of breath   Anemia of chronic disease   Acute on chronic congestive heart failure (HCC)   Muscle weakness (generalized)   COPD exacerbation (HCC)     Subjective   Sitting in chair having BK, feels better, coughing "spasms" have improved no chest pain  Inpatient Medications    Scheduled Meds: . aspirin EC  81 mg Oral Daily  . carvedilol  6.25 mg Oral BID  . cholecalciferol  1,000 Units Oral Daily  . escitalopram  10 mg Oral Daily  . ferrous sulfate  325 mg Oral Q breakfast  . furosemide  40 mg Intravenous Q12H  . hydroxychloroquine  200 mg Oral Daily  . insulin aspart  0-20 Units Subcutaneous TID WC  . insulin aspart  0-5 Units Subcutaneous QHS  . insulin glargine  50 Units Subcutaneous BH-q7a  . ipratropium-albuterol  3 mL Nebulization BID  . isosorbide mononitrate  30 mg Oral Daily  . pantoprazole  40 mg Oral BID  . polyethylene glycol  17 g Oral Daily  . tamsulosin  0.4 mg Oral QPC supper  . tiotropium  18 mcg Inhalation Daily   Continuous Infusions:  PRN Meds: acetaminophen, guaiFENesin-dextromethorphan, HYDROcodone-acetaminophen, ipratropium-albuterol, technetium TC 34M diethylenetriame-pentaacetic acid   Vital Signs    Vitals:   05/09/16 2028 05/09/16 2210 05/10/16 0441 05/10/16 0617  BP: (!) 159/58  (!) 163/76   Pulse: 71 78 69   Resp: 14 (!) 22 16   Temp: 97.7 F (36.5 C)  97.5 F (36.4 C)   TempSrc: Oral  Oral   SpO2: 98% 99% 100%   Weight:    235 lb 9.6 oz (106.9 kg)  Height:        Intake/Output Summary (Last 24 hours) at 05/10/16 0801 Last data filed at 05/10/16 0618  Gross per 24 hour  Intake             1320 ml  Output             5250 ml  Net            -3930 ml   Filed Weights   05/08/16 0503 05/09/16 0452 05/10/16 0617  Weight: 246 lb 14.6 oz (112 kg) 245 lb 1.6 oz  (111.2 kg) 235 lb 9.6 oz (106.9 kg)    Physical Exam   GEN: Well nourished,  in no acute distress.  HEENT: normocephalic, sclera clear, mucus membranes moist.  Neck: Supple, no JVD,  or masses. Cardiac: irreg irreg + systolic murmur, no rubs, or gallops. No clubbing, cyanosis, edema.  Radials 2+ and equal bilaterally. Leg edema has improved to trace, + edema in ankles and feet.  Respiratory:  Respirations regular and unlabored, Breath sounds present to auscultation bilaterally without rales, rhonchi or wheezes. Diminished in the bases GI: Abd -Soft, nontender, nondistended, BS + x 4. MS: no deformity or atrophy. Skin: warm and dry, brisk capillary refill, no obvious rash Neuro:  Alert and oriented X 3 MAE, follows commands Psych: answers questions appropriately,Normal and pleasant affect.   Labs    CBC  Recent Labs  05/09/16 0333 05/10/16 0255  WBC 8.6 7.2  HGB 8.8* 9.1*  HCT 26.5* 28.1*  MCV 83.3 83.9  PLT 303 0000000   Basic Metabolic Panel  Recent Labs  05/09/16 0333 05/10/16 0255  NA 123* 130*  K 4.4 3.9  CL 90* 92*  CO2 25 27  GLUCOSE 195* 260*  BUN 72* 69*  CREATININE 2.20* 2.13*  CALCIUM 8.7* 9.0   Liver Function Tests No results for input(s): AST, ALT, ALKPHOS, BILITOT, PROT, ALBUMIN in the last 72 hours. No results for input(s): LIPASE, AMYLASE in the last 72 hours. Cardiac Enzymes No results for input(s): CKTOTAL, CKMB, CKMBINDEX, TROPONINI in the last 72 hours. BNP Invalid input(s): POCBNP D-Dimer No results for input(s): DDIMER in the last 72 hours. Hemoglobin A1C No results for input(s): HGBA1C in the last 72 hours. Fasting Lipid Panel No results for input(s): CHOL, HDL, LDLCALC, TRIG, CHOLHDL, LDLDIRECT in the last 72 hours. Thyroid Function Tests No results for input(s): TSH, T4TOTAL, T3FREE, THYROIDAB in the last 72 hours.  Invalid input(s): FREET3  Telemetry    A fib rate controlled - Personally Reviewed  ECG    No new, reviewed  previous - Personally Reviewed  Radiology    No results found.  Cardiac Studies   Echo in 03/2016 Study Conclusions  - Left ventricle: The cavity size was normal. There was mild   concentric hypertrophy. Systolic function was normal. The   estimated ejection fraction was in the range of 55% to 60%. Wall   motion was normal; there were no regional wall motion   abnormalities. The study is not technically sufficient to allow   evaluation of LV diastolic function. - Aortic valve: A bioprosthesis was present. Transvalvular velocity   was within the normal range for prosthetic valve Peak velocity   (S): 241 cm/s. Mean gradient (S): 14 mm Hg. Peak gradient (S): 23   mm Hg. - Left atrium: The atrium was mild to moderately dilated. - Right ventricle: Systolic function was normal. - Pulmonary arteries: Systolic pressure could not be accurately   estimated. - Inferior vena cava: The vessel was dilated. The respirophasic   diameter changes were blunted (< 50%), consistent with elevated   central venous pressure.  Impressions:  - Rhythm is atrial fibrillation.  Recommendations:  Challenging image quality.  Patient Profile     82 year old male with past medical history of coronary artery disease, permanent atrial fibrillation, hypertension, hyperlipidemia, prior CVA, prior aortic valve replacement, chronic stage III kidney disease, recurrent anemia for evaluation of acute on chronic diastolic congestive heart failure. Last echocardiogram in October 2017 showed normal LV systolic function, bioprosthetic aortic valve with mean gradient 14 mmHg. Note he is not on anticoagulation for his atrial fibrillation because of history of GI blood loss requiring recurrent transfusion. He has had a watchman device placed. Patient recently admitted with pneumonia and CHF. He was treated with antibiotics and diuretics with some improvement. However over the past 2 days he notes orthopnea, increased cough  with lying flat, increasing pedal edema and dyspnea on exertion. No chest pain. Cardiology now asked to evaluate.  Assessment & Plan    1.  acute on chronic diastolic congestive heart failure-patient is volume overloaded on examination with 1-2+ peripheral edema and his weight is elevated by approximately 10 pounds compared to previous weight 10/17. Plan to discontinue or hold Demadex and resume IV Lasix 40 mg IV twice a day. - negative 3930 in 24 hours and since admit negative 15,435 - Cr improved to 2.13 and Na improved from 123 to 130 - Dr. Stanford Breed to see, change back to torsemide Vs IV lasix for today.   2 acute on chronic stage III kidney disease-renal function is worse. Follow renal function closely  with diuresis.  3 hyponatremia-sodium 123. Will fluid restrict and follow. Now 130 today  4 permanent atrial fibrillation-not on anticoagulation given recurrent GI blood loss. Continue aspirin and Coreg. Post watchman  5 chronic blood loss anemia-hemoglobin is 9.1 today . We will follow. He has required multiple transfusions previously.  6 s/p TAVR-echocardiogram in October showed normal gradient.  7. HTN  BP elevated.  163/76   8.  CAD with hx CABG, no chest pain  9.  AVR with bioprosthesis for significant AS   Signed, Cecilie Kicks, NP  05/10/2016, 8:01 AM  [place] Pager [phone]  After 5 or weekends [phone] As above, patient seen and examined. Patient's dyspnea is improving. He denies chest pain. I/O-3930. Weight 235. Sodium improved to 130. Renal function stable. Will continue IV Lasix today. If he continues to improve could transition to oral Demadex tomorrow.  Kirk Ruths, MD

## 2016-05-10 NOTE — Clinical Social Work Note (Signed)
CSW has confirmed with Levi Aland at [facility] that the patient can admit to [facility] over the weekend if ready for DC. Please contact weekend CSW for assistance with DC.   Liz Beach MSW, [place], [place], [phone]

## 2016-05-11 DIAGNOSIS — I10 Essential (primary) hypertension: Secondary | ICD-10-CM

## 2016-05-11 DIAGNOSIS — I472 Ventricular tachycardia: Secondary | ICD-10-CM

## 2016-05-11 DIAGNOSIS — I482 Chronic atrial fibrillation: Secondary | ICD-10-CM

## 2016-05-11 DIAGNOSIS — Z952 Presence of prosthetic heart valve: Secondary | ICD-10-CM

## 2016-05-11 DIAGNOSIS — I25708 Atherosclerosis of coronary artery bypass graft(s), unspecified, with other forms of angina pectoris: Secondary | ICD-10-CM

## 2016-05-11 LAB — BASIC METABOLIC PANEL
ANION GAP: 10 (ref 5–15)
BUN: 69 mg/dL — AB (ref 6–20)
CHLORIDE: 92 mmol/L — AB (ref 101–111)
CO2: 30 mmol/L (ref 22–32)
Calcium: 8.9 mg/dL (ref 8.9–10.3)
Creatinine, Ser: 2.22 mg/dL — ABNORMAL HIGH (ref 0.61–1.24)
GFR calc Af Amer: 29 mL/min — ABNORMAL LOW (ref >60)
GFR calc non Af Amer: 25 mL/min — ABNORMAL LOW (ref >60)
GLUCOSE: 78 mg/dL (ref 65–99)
POTASSIUM: 3.6 mmol/L (ref 3.5–5.1)
Sodium: 132 mmol/L — ABNORMAL LOW (ref 135–145)

## 2016-05-11 LAB — GLUCOSE, CAPILLARY
GLUCOSE-CAPILLARY: 272 mg/dL — AB (ref 65–99)
GLUCOSE-CAPILLARY: 151 mg/dL — AB (ref 65–99)
GLUCOSE-CAPILLARY: 117 mg/dL — AB (ref 65–99)
Glucose-Capillary: 70 mg/dL (ref 65–99)

## 2016-05-11 LAB — MAGNESIUM: MAGNESIUM: 2 mg/dL (ref 1.7–2.4)

## 2016-05-11 MED ORDER — INSULIN GLARGINE 100 UNIT/ML ~~LOC~~ SOLN
40.0000 [IU] | SUBCUTANEOUS | Status: DC
  Administered 2016-05-12 – 2016-05-16 (×5): 40 [IU] via SUBCUTANEOUS
  Filled 2016-05-11 (×6): qty 0.4

## 2016-05-11 MED ORDER — TORSEMIDE 20 MG PO TABS
20.0000 mg | ORAL_TABLET | Freq: Two times a day (BID) | ORAL | Status: DC
  Administered 2016-05-11 – 2016-05-12 (×3): 20 mg via ORAL
  Filled 2016-05-11 (×3): qty 1

## 2016-05-11 MED ORDER — TORSEMIDE 20 MG PO TABS: 20.0000 mg | ORAL_TABLET | Freq: Two times a day (BID) | ORAL | Status: DC

## 2016-05-11 MED ORDER — INSULIN GLARGINE 100 UNIT/ML ~~LOC~~ SOLN: 45.0000 [IU] | SUBCUTANEOUS | Status: DC

## 2016-05-11 MED ORDER — INSULIN GLARGINE 100 UNIT/ML ~~LOC~~ SOLN
45.0000 [IU] | SUBCUTANEOUS | Status: DC
  Administered 2016-05-11: 45 [IU] via SUBCUTANEOUS

## 2016-05-11 MED ORDER — PROSIGHT PO TABS
1.0000 | ORAL_TABLET | Freq: Every day | ORAL | Status: DC
  Administered 2016-05-11 – 2016-05-16 (×6): 1 via ORAL
  Filled 2016-05-11 (×6): qty 1

## 2016-05-11 MED ORDER — ADULT MULTIVITAMIN W/MINERALS CH
1.0000 | ORAL_TABLET | Freq: Two times a day (BID) | ORAL | Status: DC
  Administered 2016-05-11 – 2016-05-16 (×11): 1 via ORAL
  Filled 2016-05-11 (×11): qty 1

## 2016-05-11 MED ORDER — VITAMIN C 500 MG PO TABS
500.0000 mg | ORAL_TABLET | Freq: Every day | ORAL | Status: DC
  Administered 2016-05-11 – 2016-05-16 (×6): 500 mg via ORAL
  Filled 2016-05-11 (×6): qty 1

## 2016-05-11 MED ORDER — POTASSIUM CHLORIDE 20 MEQ PO PACK
40.0000 meq | PACK | Freq: Once | ORAL | Status: AC
  Administered 2016-05-11: 40 meq via ORAL
  Filled 2016-05-11: qty 2

## 2016-05-11 NOTE — Progress Notes (Signed)
Family Medicine Teaching Service Daily Progress Note Intern Pager: [phone]  Patient [name] Medical record number: [id number] Date of birth: 11-06-1927 Age: 82 y.o. Gender: male  Primary Care Provider: [name], MD Consultants: none Code Status: DNR/DNI  Pt Overview and Major Events to Date:  11/23 - admit to FPTS for SOB and weakness. Ruled out PE with VQ scan, LE dopplers no DVT. Rocephin azithro started for presumed CAP. 11/24 - transfused 1U PRBCs > hgb 9.0 11/26 - considered transitioning to PO abx, but allergic to augmentin, so will plan for [facility] as outpatient. 1 episode of ? Decreased alertness. Added back duonebs and gave IV lasix 40mg  x 1, as well as solumedrol 125mg  x 1 11/27 - abx course completed. 11/28 - added Spiriva 11/30 - steroid course completed. HF team consulted for additional diuresis.   Assessment and Plan: [name] is a 82 y.o. male presenting with weakness and shortness of breath in setting of recent URI treated as CAP with concomitant CHF exacerbation. PMH is significant for CHF, HTN, CAD, hx of CVA with residual weakness, T2DM, anemia in the setting of angiodysplasia of stomach, history of prostate cancer.   Dyspnea 2/2 CHF exacerbation/?undiagnosed COPD. S/p diuresis with 40 mg IV Lasix BID for 3d followed by 40 mg IV Lasix x1 yesterday.  UOP -19.4 since admit, out 3.18L yesterday. HF consulted 11/30 for additional diuresis.  Significant 2+ pitting edema to the knee, sacral edema noted. Patient states breathing is doing great.  - HF team consulted, appreciate recs - Torsemide 20 mg BID per cards - Duonebs q6h PRN,  - continue Spiriva controller  - cardiac monitoring - daily weights and I&Os - PT- HHPT, 24h supervision, OT- no OT f/u - SNF at [facility]   Chronic Hyponatremia , improving - Hypervolemic, likely due to CHF. Na 128 on admission. Today Na 132, improving with diuresis  - Continue fluid restrict  - Continue to  monitor  Possible Community Acquired Pneumonia, resolved.  Afebrile, no leukocytosis. Patient completed treatment, s/p 5 days rocephin and azithromycin, CXR improved from admission. - duonebs changed to PRN  Stable Normocytic Anemia - chronic anemia w/ hx of iron deficiency, CKD, and recent GI bleed 2/2 angiodysplasia of stomach. S/p 1 unit pRBCs on 11/23 for Hgb 8.0 on admission - continue home iron supplementation - Transfusion threshold is 8 - Will get CBC   AKI on CKD3, Stable Baseline appears to be ~2.10. Now near baseline with Cr from 2.63 >>>2.07>>2.13>2.22. - S/p judicious diuresis with Lasix 40mg  IV BID, per HF team  - avoid nephrotoxic medications - monitor daily  A fib- currently in a fib, rate controlled, on coreg at home. Takes aspirin daily. - continue Aspirin 81mg  daily and Coreg 6.25mg  bid - monitor on telemetry  Type 2 Diabetes-  A1C 6.6. Takes 50 units Lantus every morning and 3-7 units of Novolog TID. AM CBG 70  - Will decrease Lantus to 45 units - Continue moderate SSI  - CBG AC/HS  HTN- on coreg and norvasc at home. Controlled. - holding norvasc due to LE edema - continue home medications  Constipation- Resolved.  - Continue colace and miralax  Depression- stable, takes Lexapro 10mg  daily -continue home medication  Angiodysplasia of stomach, last bleed 09/2015.  On 40 mg protonix at home also on ASA. - use SCDs rather than lovenox ppx - protonix 40 mg BID  FEN/GI: carb modified/heart healthy diet, protonix  Prophylaxis: SCDs  Disposition: SNF placement  once medically stable   Subjective:  Doing well, very pleasant elderly man. Has no complaints. Denies on SOB. Feels like his breathing has improved significantly   Objective: Temp:  [97.6 F (36.4 C)-98.1 F (36.7 C)] 97.6 F (36.4 C) (12/02 0430) Pulse Rate:  [65-74] 67 (12/02 0430) Resp:  [18-20] 18 (12/02 0430) BP: (120-133)/(56-74) 132/56 (12/02 0430) SpO2:  [96 %-99 %] 99 % (12/02  0430) Weight:  [234 lb 6.4 oz (106.3 kg)] 234 lb 6.4 oz (106.3 kg) (12/02 0430) Physical Exam:  General: Pleasant, sitting up in chair and eating breakfast Cardiovascular: RRR, no m/r/g Respiratory: Normal work of breathing, intermittent crackles with decreased breath sounds at the bases Gastrointestinal: soft, nontender, nondistended, normoactive BS MSK: 2+ pitting edema to the knee, 1+ to the upper thigh, chronic venous stasis changes chronic skin changes, moves all extremities equally Neuro: Awake, alert, no focal deficits  Laboratory: 05/06/2016 ABG 7.418/40.8/64.6/26.0 (Normal blood gas profile)  Recent Labs Lab 05/08/16 0549 05/09/16 0333 05/10/16 0255  WBC 8.4 8.6 7.2  HGB 9.0* 8.8* 9.1*  HCT 27.3* 26.5* 28.1*  PLT 271 303 324    Recent Labs Lab 05/09/16 0333 05/10/16 0255 05/11/16 0349  NA 123* 130* 132*  K 4.4 3.9 3.6  CL 90* 92* 92*  CO2 25 27 30   BUN 72* 69* 69*  CREATININE 2.20* 2.13* 2.22*  CALCIUM 8.7* 9.0 8.9  GLUCOSE 195* 260* 78    Hgb A1c 6.6  Imaging/Diagnostic Tests: No new imaging today.   Deral Schellenberg C[employer]is [employer], MD 05/11/2016, 9:02 AM PGY-2, [place] Intern pager: [phone], text pages welcome

## 2016-05-11 NOTE — Progress Notes (Signed)
Patient [name] Date of Encounter: 05/11/2016  Primary Cardiologist: [facility] Problem List     Active Problems:   Pneumonia   Shortness of breath   Anemia of chronic disease   Acute on chronic congestive heart failure (HCC)   Muscle weakness (generalized)   COPD exacerbation (HCC)     Subjective   Says breathing has markedly improved. Denies chest pain and palpitations. Left leg pain.  Inpatient Medications    Scheduled Meds: . aspirin EC  81 mg Oral Daily  . carvedilol  6.25 mg Oral BID  . cholecalciferol  1,000 Units Oral Daily  . escitalopram  10 mg Oral Daily  . ferrous sulfate  325 mg Oral Q breakfast  . furosemide  40 mg Intravenous Q12H  . hydroxychloroquine  200 mg Oral Daily  . insulin aspart  0-15 Units Subcutaneous TID WC  . insulin aspart  0-5 Units Subcutaneous QHS  . insulin glargine  50 Units Subcutaneous BH-q7a  . isosorbide mononitrate  30 mg Oral Daily  . pantoprazole  40 mg Oral BID  . polyethylene glycol  17 g Oral Daily  . potassium chloride  40 mEq Oral Once  . tamsulosin  0.4 mg Oral QPC supper  . tiotropium  18 mcg Inhalation Daily   Continuous Infusions:  PRN Meds: acetaminophen, guaiFENesin-dextromethorphan, HYDROcodone-acetaminophen, ipratropium-albuterol, technetium TC 28M diethylenetriame-pentaacetic acid   Vital Signs    Vitals:   05/10/16 1028 05/10/16 1428 05/10/16 2050 05/11/16 0430  BP: 133/73 132/74 120/60 (!) 132/56  Pulse: 65 65 74 67  Resp: 20 18 18 18   Temp:  97.9 F (36.6 C) 98.1 F (36.7 C) 97.6 F (36.4 C)  TempSrc:  Oral Oral Oral  SpO2: 98% 96% 99% 99%  Weight:    234 lb 6.4 oz (106.3 kg)  Height:        Intake/Output Summary (Last 24 hours) at 05/11/16 0908 Last data filed at 05/11/16 0500  Gross per 24 hour  Intake              120 ml  Output             3300 ml  Net            -3180 ml   Filed Weights   05/09/16 0452 05/10/16 0617 05/11/16 0430  Weight: 245 lb 1.6 oz (111.2  kg) 235 lb 9.6 oz (106.9 kg) 234 lb 6.4 oz (106.3 kg)    Physical Exam    GEN: Well nourished, well developed, in no acute distress.  HEENT: Grossly normal.  Neck: Supple, no JVD, carotid bruits, or masses. Cardiac: Regular rate, irregular rhythm, 2/6 systolic murmur over b/l USB's, no rubs or gallops. No clubbing, cyanosis, trace pretibial edema.    Respiratory:  Respirations regular and unlabored, clear to auscultation bilaterally. Diminished at bases. GI: Soft, nontender, nondistended. MS: no deformity or atrophy. Neuro:  Strength and sensation are intact. Psych: AAOx3.  Normal affect.  Labs    CBC  Recent Labs  05/09/16 0333 05/10/16 0255  WBC 8.6 7.2  HGB 8.8* 9.1*  HCT 26.5* 28.1*  MCV 83.3 83.9  PLT 303 0000000   Basic Metabolic Panel  Recent Labs  05/10/16 0255 05/11/16 0349  NA 130* 132*  K 3.9 3.6  CL 92* 92*  CO2 27 30  GLUCOSE 260* 78  BUN 69* 69*  CREATININE 2.13* 2.22*  CALCIUM 9.0 8.9   Liver Function Tests No results for input(s): AST,  ALT, ALKPHOS, BILITOT, PROT, ALBUMIN in the last 72 hours. No results for input(s): LIPASE, AMYLASE in the last 72 hours. Cardiac Enzymes No results for input(s): CKTOTAL, CKMB, CKMBINDEX, TROPONINI in the last 72 hours. BNP Invalid input(s): POCBNP D-Dimer No results for input(s): DDIMER in the last 72 hours. Hemoglobin A1C No results for input(s): HGBA1C in the last 72 hours. Fasting Lipid Panel No results for input(s): CHOL, HDL, LDLCALC, TRIG, CHOLHDL, LDLDIRECT in the last 72 hours. Thyroid Function Tests No results for input(s): TSH, T4TOTAL, T3FREE, THYROIDAB in the last 72 hours.  Invalid input(s): FREET3  Telemetry    Rate controlled atrial fibrillation, NSVT - Personally Reviewed  ECG    - Personally Reviewed  Radiology    No results found.  Cardiac Studies     Patient Profile     82 year old male with past medical history of coronary artery disease, permanent atrial fibrillation,  hypertension, hyperlipidemia, prior CVA, prior aortic valve replacement, chronic stage III kidney disease, recurrent anemia for evaluation of acute on chronic diastolic congestive heart failure. Last echocardiogram in October 2017 showed normal LV systolic function, bioprosthetic aortic valve with mean gradient 14 mmHg. Note he is not on anticoagulation for his atrial fibrillation because of history of GI blood loss requiring recurrent transfusion. He has had a watchman device placed. Patient recently admitted with pneumonia and CHF. He was treated with antibiotics and diuretics with some improvement. He presented with orthopnea, increased cough with lying flat, increasing pedal edema and dyspnea on exertion. No chest pain.   Assessment & Plan    1. Acute on chronic diastolic congestive heart failure: Improving with good diuretic response in last 24 hrs (>3 L). Will switch IV Lasix to torsemide 20 mg bid. - Cr improved 2.22 and Na improved from 123 to 132   2. Acute on chronic stage III kidney disease: Cr 2.22. Follow renal function closely with diuresis.  3. Hyponatremia-sodium up to 132. Will fluid restrict and follow.   4. Permanent atrial fibrillation-not on anticoagulation given recurrent GI blood loss. Continue aspirin and Coreg. Post watchman  5. Chronic blood loss anemia-hemoglobin was 9.1 yesterday . We will follow. He has required multiple transfusions previously.  6. S/p TAVR-echocardiogram in October showed normal gradient.  7. HTN:  BP controlled.  8.  CAD with hx CABG, no chest pain  9.  AVR with bioprosthesis for significant AS  10. NSVT: Will given 40 meq KCl x 1. Will check serum magnesium level.  Signed, Kate Sable, MD  05/11/2016, 9:08 AM

## 2016-05-12 DIAGNOSIS — E871 Hypo-osmolality and hyponatremia: Secondary | ICD-10-CM

## 2016-05-12 LAB — BASIC METABOLIC PANEL
ANION GAP: 9 (ref 5–15)
BUN: 66 mg/dL — ABNORMAL HIGH (ref 6–20)
CO2: 30 mmol/L (ref 22–32)
Calcium: 8.4 mg/dL — ABNORMAL LOW (ref 8.9–10.3)
Chloride: 91 mmol/L — ABNORMAL LOW (ref 101–111)
Creatinine, Ser: 2.4 mg/dL — ABNORMAL HIGH (ref 0.61–1.24)
GFR calc Af Amer: 27 mL/min — ABNORMAL LOW (ref >60)
GFR, EST NON AFRICAN AMERICAN: 23 mL/min — AB (ref >60)
GLUCOSE: 194 mg/dL — AB (ref 65–99)
POTASSIUM: 4.2 mmol/L (ref 3.5–5.1)
Sodium: 130 mmol/L — ABNORMAL LOW (ref 135–145)

## 2016-05-12 LAB — CBC
HEMATOCRIT: 28.7 % — AB (ref 39.0–52.0)
Hemoglobin: 9.2 g/dL — ABNORMAL LOW (ref 13.0–17.0)
MCH: 27.3 pg (ref 26.0–34.0)
MCHC: 32.1 g/dL (ref 30.0–36.0)
MCV: 85.2 fL (ref 78.0–100.0)
PLATELETS: 326 10*3/uL (ref 150–400)
RBC: 3.37 MIL/uL — AB (ref 4.22–5.81)
RDW: 14.7 % (ref 11.5–15.5)
WBC: 10 10*3/uL (ref 4.0–10.5)

## 2016-05-12 LAB — GLUCOSE, CAPILLARY
GLUCOSE-CAPILLARY: 127 mg/dL — AB (ref 65–99)
GLUCOSE-CAPILLARY: 193 mg/dL — AB (ref 65–99)
GLUCOSE-CAPILLARY: 259 mg/dL — AB (ref 65–99)
Glucose-Capillary: 154 mg/dL — ABNORMAL HIGH (ref 65–99)

## 2016-05-12 MED ORDER — FUROSEMIDE 10 MG/ML IJ SOLN
40.0000 mg | Freq: Two times a day (BID) | INTRAMUSCULAR | Status: DC
Start: 2016-05-12 — End: 2016-05-14
  Administered 2016-05-12 – 2016-05-14 (×5): 40 mg via INTRAVENOUS
  Filled 2016-05-12 (×4): qty 4

## 2016-05-12 NOTE — Progress Notes (Addendum)
Patient [name] Date of Encounter: 05/12/2016  Primary Cardiologist:    Patient [name] Date of Encounter: 05/12/2016  Primary Cardiologist: [facility] Problem List     Active Problems:   Pneumonia   Shortness of breath   Anemia of chronic disease   Acute on chronic congestive heart failure (HCC)   Muscle weakness (generalized)   COPD exacerbation ([place])     Subjective   [name] says he is doing so much better. Says both breathing and leg swelling have markedly improved. No longer having coughing spasms.  Inpatient Medications    Scheduled Meds: . aspirin EC  81 mg Oral Daily  . carvedilol  6.25 mg Oral BID  . cholecalciferol  1,000 Units Oral Daily  . escitalopram  10 mg Oral Daily  . ferrous sulfate  325 mg Oral Q breakfast  . hydroxychloroquine  200 mg Oral Daily  . insulin aspart  0-15 Units Subcutaneous TID WC  . insulin aspart  0-5 Units Subcutaneous QHS  . insulin glargine  40 Units Subcutaneous BH-q7a  . isosorbide mononitrate  30 mg Oral Daily  . multivitamin  1 tablet Oral Daily  . multivitamin with minerals  1 tablet Oral BID  . pantoprazole  40 mg Oral BID  . polyethylene glycol  17 g Oral Daily  . tamsulosin  0.4 mg Oral QPC supper  . tiotropium  18 mcg Inhalation Daily  . torsemide  20 mg Oral BID  . vitamin C  500 mg Oral Daily   Continuous Infusions:  PRN Meds: acetaminophen, guaiFENesin-dextromethorphan, HYDROcodone-acetaminophen, ipratropium-albuterol, technetium TC 9M diethylenetriame-pentaacetic acid   Vital Signs    Vitals:   05/11/16 0430 05/11/16 1417 05/11/16 2225 05/12/16 0520  BP: (!) 132/56 109/81 (!) 130/39 (!) 149/56  Pulse: 67 66 65 65  Resp: 18 17 (!) 21 19  Temp: 97.6 F (36.4 C) 98.3 F (36.8 C) 97.9 F (36.6 C)   TempSrc: Oral Oral Oral Oral  SpO2: 99% 96% 100% 96%  Weight: 234 lb 6.4 oz (106.3 kg)   235 lb 4.8 oz (106.7 kg)  Height:        Intake/Output Summary (Last 24  hours) at 05/12/16 0827 Last data filed at 05/12/16 0538  Gross per 24 hour  Intake              270 ml  Output             1600 ml  Net            -1330 ml   Filed Weights   05/10/16 0617 05/11/16 0430 05/12/16 0520  Weight: 235 lb 9.6 oz (106.9 kg) 234 lb 6.4 oz (106.3 kg) 235 lb 4.8 oz (106.7 kg)    Physical Exam  GEN: Well nourished, well developed, in no acute distress.  HEENT: Grossly normal.  Neck: Supple, no JVD, carotid bruits, or masses. Cardiac: Regular rate, irregular rhythm, 2/6 systolic murmur over b/l USB's, no rubs or gallops. No clubbing, cyanosis, trace pretibial edema.    Respiratory:  Respirations regular and unlabored, clear to auscultation bilaterally. Diminished at bases. GI: Soft, nontender, nondistended. MS: no deformity or atrophy. Neuro:  Strength and sensation are intact. Psych: AAOx3.  Normal affect.  Labs    CBC  Recent Labs  05/10/16 0255 05/12/16 0418  WBC 7.2 10.0  HGB 9.1* 9.2*  HCT 28.1* 28.7*  MCV 83.9 85.2  PLT 324 [id number]   Basic Metabolic Panel  Recent Labs  05/11/16 0349 05/11/16 0921 05/12/16 0418  NA 132*  --  130*  K 3.6  --  4.2  CL 92*  --  91*  CO2 30  --  30  GLUCOSE 78  --  194*  BUN 69*  --  66*  CREATININE 2.22*  --  2.40*  CALCIUM 8.9  --  8.4*  MG  --  2.0  --    Liver Function Tests No results for input(s): AST, ALT, ALKPHOS, BILITOT, PROT, ALBUMIN in the last 72 hours. No results for input(s): LIPASE, AMYLASE in the last 72 hours. Cardiac Enzymes No results for input(s): CKTOTAL, CKMB, CKMBINDEX, TROPONINI in the last 72 hours. BNP Invalid input(s): POCBNP D-Dimer No results for input(s): DDIMER in the last 72 hours. Hemoglobin A1C No results for input(s): HGBA1C in the last 72 hours. Fasting Lipid Panel No results for input(s): CHOL, HDL, LDLCALC, TRIG, CHOLHDL, LDLDIRECT in the last 72 hours. Thyroid Function Tests No results for input(s): TSH, T4TOTAL, T3FREE, THYROIDAB in the last 72  hours.  Invalid input(s): FREET3  Telemetry     A fib rate controlled- Personally Reviewed  ECG     Personally Reviewed  Radiology    No results found.  Cardiac Studies     Patient Profile     82 year old male with past medical history of coronary artery disease, permanent atrial fibrillation, hypertension, hyperlipidemia, prior CVA, prior aortic valve replacement, chronic stage III kidney disease, recurrent anemia for evaluation of acute on chronic diastolic congestive heart failure. Last echocardiogram in October 2017 showed normal LV systolic function, bioprosthetic aortic valve with mean gradient 14 mmHg. Note he is not on anticoagulation for his atrial fibrillation because of history of GI blood loss requiring recurrent transfusion. He has had a watchman device placed. Patient recently admitted with pneumonia and CHF. He was treated with antibiotics and diuretics with some improvement. However over the days prior to admission, he noted orthopnea, increased cough with lying flat, increasing pedal edema and dyspnea on exertion. No chest pain.   Assessment & Plan    1. Acute on chronic diastolic congestive heart failure: Good diuretic response in last 24 hrs (1.3 L). However, he is more orthopneic today and coughs each time he lies down. He may eventually require periodic metolazone. Will dc torsemide and provide IV Lasix today. - Cr elevated to 2.4 and Na stable, 130.   2. Acute on chronic stage III kidney disease: Cr 2.4. Follow renal function closely with diuresis.  3. Hyponatremia-sodium up to 130.    4. Permanent atrial fibrillation-not on anticoagulation given recurrent GI blood loss. Continue aspirin and Coreg. Post Watchman.  5. Chronic blood loss anemia-hemoglobin 9.2 today . He has required multiple transfusions previously.  6. S/p TAVR-echocardiogram in October showed normal gradient.  7. HTN: BP mildly elevated. Monitor.  8. CAD with hx CABG, no chest  pain  9. AVR with bioprosthesis for significant AS  10. NSVT: Serum magnesium level normal 12/2. KCl given yesterday, K 4.2 today.  Dispo: Consider discharge on 05/13/16.  Signed, Kate Sable, MD  05/12/2016, 8:27 AM

## 2016-05-12 NOTE — Progress Notes (Signed)
Family Medicine Teaching Service Daily Progress Note Intern Pager: [phone]  Patient [name] Medical record number: [id number] Date of birth: [birth year] Age: 82 y.o. Gender: male  Primary Care Provider: [name], MD Consultants: none Code Status: DNR/DNI  Pt Overview and Major Events to Date:  11/23 - admit to FPTS for SOB and weakness. Ruled out PE with VQ scan, LE dopplers no DVT. Rocephin azithro started for presumed CAP. 11/24 - transfused 1U PRBCs > hgb 9.0 11/26 - considered transitioning to PO abx, but allergic to augmentin, so will plan for [facility] as outpatient. 1 episode of ? Decreased alertness. Added back duonebs and gave IV lasix 40mg  x 1, as well as solumedrol 125mg  x 1 11/27 - abx course completed. 11/28 - added Spiriva 11/30 - steroid course completed. HF team consulted for additional diuresis.   Assessment and Plan: HANZEL MAGRINI is a 82 y.o. male presenting with weakness and shortness of breath in setting of recent URI treated as CAP with concomitant CHF exacerbation. PMH is significant for CHF, HTN, CAD, hx of CVA with residual weakness, T2DM, anemia in the setting of angiodysplasia of stomach, history of prostate cancer.   Dyspnea 2/2 CHF exacerbation/?undiagnosed COPD. S/p diuresis with IV Lasix BID; now on torsemide. UOP -20.4 since admit, out 1.6L yesterday. Much improved. - HF team consulted, appreciate recs - Torsemide 20 mg BID per cards - Duonebs q6h PRN - continue Spiriva controller  - cardiac monitoring - daily weights and I&Os - PT- HHPT, 24h supervision, OT- no OT f/u - SNF at [facility]   Chronic Hyponatremia , improving - Hypervolemic, likely due to CHF. Na 128 on admission. Today Na 130, improving with diuresis  - Continue fluid restrict  - Continue to monitor  Possible Community Acquired Pneumonia, resolved.  Afebrile, no leukocytosis. Patient completed treatment, s/p 5 days rocephin and azithromycin, CXR improved from  admission.  Stable Normocytic Anemia - chronic anemia w/ hx of iron deficiency, CKD, and recent GI bleed 2/2 angiodysplasia of stomach. S/p 1 unit pRBCs on 11/23 for Hgb 8.0 on admission - continue home iron supplementation - Transfusion threshold is 8 - monitor CBCs  AKI on CKD3, Stable Baseline appears to be ~2.10. Now near baseline but with some worsening in setting of diuresis. - S/p judicious diuresis with Lasix 40mg  IV BID, per HF team  - avoid nephrotoxic medications - monitor daily  A fib- currently in a fib, rate controlled, on coreg at home. Takes aspirin daily. - continue Aspirin 81mg  daily and Coreg 6.25mg  bid - monitor on telemetry  Type 2 Diabetes-  A1C 6.6. Takes 50 units Lantus every morning and 3-7 units of Novolog TID. AM CBG 70  - Continue Lantus to 40 units - Continue moderate SSI  - CBG AC/HS  HTN- on coreg and norvasc at home. Controlled. - holding norvasc due to LE edema - continue home medications  Constipation- Resolved.  - Continue colace and miralax  Depression- stable, takes Lexapro 10mg  daily -continue home medication  Angiodysplasia of stomach, last bleed 09/2015.  On 40 mg protonix at home also on ASA. - use SCDs rather than lovenox ppx - protonix 40 mg BID  FEN/GI: carb modified/heart healthy diet, protonix  Prophylaxis: SCDs  Disposition: SNF placement  Subjective:  Doing well this morning. States his breathing is improved and he was allowed to lay in bed last night which was a relief. No complaints.  Objective: Temp:  [97.9 F (36.6 C)-98.3 F (36.8  C)] 97.9 F (36.6 C) (12/02 2225) Pulse Rate:  [65-66] 65 (12/03 0520) Resp:  [17-21] 19 (12/03 0520) BP: (109-149)/(39-81) 149/56 (12/03 0520) SpO2:  [96 %-100 %] 96 % (12/03 0520) Weight:  [235 lb 4.8 oz (106.7 kg)] 235 lb 4.8 oz (106.7 kg) (12/03 0520) Physical Exam:  General: Pleasant, sitting up in bed and eating breakfast Cardiovascular: RRR, no m/r/g Respiratory: Normal  work of breathing,CTAB, diminished air movement at bases.  Gastrointestinal: soft, nontender, nondistended, normoactive BS MSK: 2+ pitting edema to the knee on right, just pretibial edema on left, moves all extremities equally Neuro: Awake, alert, no focal deficits  Laboratory: 05/06/2016 ABG 7.418/40.8/64.6/26.0 (Normal blood gas profile)  Recent Labs Lab 05/09/16 0333 05/10/16 0255 05/12/16 0418  WBC 8.6 7.2 10.0  HGB 8.8* 9.1* 9.2*  HCT 26.5* 28.1* 28.7*  PLT 303 324 326    Recent Labs Lab 05/10/16 0255 05/11/16 0349 05/12/16 0418  NA 130* 132* 130*  K 3.9 3.6 4.2  CL 92* 92* 91*  CO2 27 30 30   BUN 69* 69* 66*  CREATININE 2.13* 2.22* 2.40*  CALCIUM 9.0 8.9 8.4*  GLUCOSE 260* 78 194*    Hgb A1c 6.6  Imaging/Diagnostic Tests: No new imaging today.   Katheren Shams, DO 05/12/2016, 8:02 AM PGY-3, [place] Intern pager: [phone], text pages welcome

## 2016-05-13 ENCOUNTER — Inpatient Hospital Stay: Payer: Medicare Other

## 2016-05-13 DIAGNOSIS — I110 Hypertensive heart disease with heart failure: Secondary | ICD-10-CM

## 2016-05-13 LAB — BASIC METABOLIC PANEL
ANION GAP: 9 (ref 5–15)
BUN: 65 mg/dL — ABNORMAL HIGH (ref 6–20)
CALCIUM: 8.4 mg/dL — AB (ref 8.9–10.3)
CO2: 30 mmol/L (ref 22–32)
Chloride: 92 mmol/L — ABNORMAL LOW (ref 101–111)
Creatinine, Ser: 2.33 mg/dL — ABNORMAL HIGH (ref 0.61–1.24)
GFR, EST AFRICAN AMERICAN: 27 mL/min — AB (ref >60)
GFR, EST NON AFRICAN AMERICAN: 24 mL/min — AB (ref >60)
Glucose, Bld: 71 mg/dL (ref 65–99)
Potassium: 3.4 mmol/L — ABNORMAL LOW (ref 3.5–5.1)
Sodium: 131 mmol/L — ABNORMAL LOW (ref 135–145)

## 2016-05-13 LAB — GLUCOSE, CAPILLARY
GLUCOSE-CAPILLARY: 95 mg/dL (ref 65–99)
GLUCOSE-CAPILLARY: 257 mg/dL — AB (ref 65–99)
GLUCOSE-CAPILLARY: 297 mg/dL — AB (ref 65–99)
Glucose-Capillary: 422 mg/dL — ABNORMAL HIGH (ref 65–99)

## 2016-05-13 LAB — CBC
HCT: 27.8 % — ABNORMAL LOW (ref 39.0–52.0)
HEMOGLOBIN: 9.1 g/dL — AB (ref 13.0–17.0)
MCH: 27.7 pg (ref 26.0–34.0)
MCHC: 32.7 g/dL (ref 30.0–36.0)
MCV: 84.5 fL (ref 78.0–100.0)
Platelets: 334 10*3/uL (ref 150–400)
RBC: 3.29 MIL/uL — AB (ref 4.22–5.81)
RDW: 14.7 % (ref 11.5–15.5)
WBC: 7.6 10*3/uL (ref 4.0–10.5)

## 2016-05-13 MED ORDER — INSULIN ASPART 100 UNIT/ML ~~LOC~~ SOLN
15.0000 [IU] | Freq: Once | SUBCUTANEOUS | Status: AC
  Administered 2016-05-13: 15 [IU] via SUBCUTANEOUS

## 2016-05-13 MED ORDER — ENSURE ENLIVE PO LIQD
237.0000 mL | Freq: Two times a day (BID) | ORAL | Status: DC
  Administered 2016-05-13: 237 mL via ORAL

## 2016-05-13 MED ORDER — INSULIN ASPART 100 UNIT/ML ~~LOC~~ SOLN
0.0000 [IU] | Freq: Three times a day (TID) | SUBCUTANEOUS | Status: DC
  Administered 2016-05-13: 5 [IU] via SUBCUTANEOUS
  Administered 2016-05-14: 7 [IU] via SUBCUTANEOUS
  Administered 2016-05-14: 3 [IU] via SUBCUTANEOUS
  Administered 2016-05-14: 2 [IU] via SUBCUTANEOUS
  Administered 2016-05-15: 7 [IU] via SUBCUTANEOUS

## 2016-05-13 MED ORDER — POTASSIUM CHLORIDE CRYS ER 20 MEQ PO TBCR
40.0000 meq | EXTENDED_RELEASE_TABLET | Freq: Once | ORAL | Status: AC
Start: 2016-05-13 — End: 2016-05-13
  Administered 2016-05-13: 40 meq via ORAL
  Filled 2016-05-13: qty 2

## 2016-05-13 NOTE — Care Management Important Message (Signed)
Important Message  Patient Details  Name: JODECI CINK MRN: [id number] Date of Birth: 11-18-1927   Medicare Important Message Given:  Yes    Leahann Lempke Abena 05/13/2016, 11:25 AM

## 2016-05-13 NOTE — Progress Notes (Signed)
Patient [name] Date of Encounter: 05/13/2016  Primary Cardiologist: Dr. Rolly Pancake Problem List     Active Problems:   Pneumonia   Shortness of breath   Anemia of chronic disease   Acute on chronic congestive heart failure (HCC)   Muscle weakness (generalized)   COPD exacerbation (HCC)     Subjective   Feeling well. He reports that his shortness of breath has improved. Denies chest pain.  Inpatient Medications    Scheduled Meds: . aspirin EC  81 mg Oral Daily  . carvedilol  6.25 mg Oral BID  . cholecalciferol  1,000 Units Oral Daily  . escitalopram  10 mg Oral Daily  . ferrous sulfate  325 mg Oral Q breakfast  . furosemide  40 mg Intravenous BID  . hydroxychloroquine  200 mg Oral Daily  . insulin aspart  0-15 Units Subcutaneous TID WC  . insulin aspart  0-5 Units Subcutaneous QHS  . insulin glargine  40 Units Subcutaneous BH-q7a  . isosorbide mononitrate  30 mg Oral Daily  . multivitamin  1 tablet Oral Daily  . multivitamin with minerals  1 tablet Oral BID  . pantoprazole  40 mg Oral BID  . polyethylene glycol  17 g Oral Daily  . tamsulosin  0.4 mg Oral QPC supper  . tiotropium  18 mcg Inhalation Daily  . vitamin C  500 mg Oral Daily   Continuous Infusions:  PRN Meds: acetaminophen, guaiFENesin-dextromethorphan, HYDROcodone-acetaminophen, ipratropium-albuterol, technetium TC 97M diethylenetriame-pentaacetic acid   Vital Signs    Vitals:   05/12/16 0900 05/12/16 1357 05/12/16 2119 05/13/16 0508  BP:  (!) 106/40 (!) 123/52 (!) 125/46  Pulse:  63 62 65  Resp:  16 16   Temp:  98.4 F (36.9 C) 98.7 F (37.1 C) 98.4 F (36.9 C)  TempSrc:  Oral Oral Oral  SpO2: 97% 99% 100% 99%  Weight:    105.1 kg (231 lb 11.2 oz)  Height:        Intake/Output Summary (Last 24 hours) at 05/13/16 0838 Last data filed at 05/13/16 [id number]  Gross per 24 hour  Intake                0 ml  Output             3150 ml  Net            -3150 ml   Filed  Weights   05/11/16 0430 05/12/16 0520 05/13/16 0508  Weight: 106.3 kg (234 lb 6.4 oz) 106.7 kg (235 lb 4.8 oz) 105.1 kg (231 lb 11.2 oz)    Physical Exam   GEN: Well nourished, well developed, in no acute distress.  HEENT: Grossly normal.  Neck: Supple, JVP 1 cm above clavicle at 45. No bibasilar crackles. carotid bruits, or masses. Cardiac: RRR, II/VI systolic murmur at LUSB.  No rubs, or gallops. No clubbing or cyanosis.  Radials/DP/PT 2+ and equal bilaterally. 2+ pitting edema to the mid tibia bilaterally. Respiratory:  Respirations regular and unlabored.   GI: Soft, nontender, nondistended, BS + x 4. MS: no deformity or atrophy. Skin: warm and dry, no rash. Neuro:  Strength and sensation are intact. Psych: AAOx3.  Normal affect.  Labs    CBC  Recent Labs  05/12/16 0418 05/13/16 0435  WBC 10.0 7.6  HGB 9.2* 9.1*  HCT 28.7* 27.8*  MCV 85.2 84.5  PLT 326 [id number]   Basic Metabolic Panel  Recent Labs  05/11/16 0921 05/12/16 0418  05/13/16 0435  NA  --  130* 131*  K  --  4.2 3.4*  CL  --  91* 92*  CO2  --  30 30  GLUCOSE  --  194* 71  BUN  --  66* 65*  CREATININE  --  2.40* 2.33*  CALCIUM  --  8.4* 8.4*  MG 2.0  --   --    Liver Function Tests No results for input(s): AST, ALT, ALKPHOS, BILITOT, PROT, ALBUMIN in the last 72 hours. No results for input(s): LIPASE, AMYLASE in the last 72 hours. Cardiac Enzymes No results for input(s): CKTOTAL, CKMB, CKMBINDEX, TROPONINI in the last 72 hours. BNP Invalid input(s): POCBNP D-Dimer No results for input(s): DDIMER in the last 72 hours. Hemoglobin A1C No results for input(s): HGBA1C in the last 72 hours. Fasting Lipid Panel No results for input(s): CHOL, HDL, LDLCALC, TRIG, CHOLHDL, LDLDIRECT in the last 72 hours. Thyroid Function Tests No results for input(s): TSH, T4TOTAL, T3FREE, THYROIDAB in the last 72 hours.  Invalid input(s): FREET3  Telemetry    Atrial fibrillation. Rate 40s to 60s. - Personally  Reviewed  ECG    n/a  Radiology    No results found.  Cardiac Studies   Echo 03/22/16: - Left ventricle: The cavity size was normal. There was mild   concentric hypertrophy. Systolic function was normal. The   estimated ejection fraction was in the range of 55% to 60%. Wall   motion was normal; there were no regional wall motion   abnormalities. The study is not technically sufficient to allow   evaluation of LV diastolic function. - Aortic valve: A bioprosthesis was present. Transvalvular velocity   was within the normal range for prosthetic valve Peak velocity   (S): 241 cm/s. Mean gradient (S): 14 mm Hg. Peak gradient (S): 23   mm Hg. - Left atrium: The atrium was mild to moderately dilated. - Right ventricle: Systolic function was normal. - Pulmonary arteries: Systolic pressure could not be accurately   estimated. - Inferior vena cava: The vessel was dilated. The respirophasic   diameter changes were blunted (< 50%), consistent with elevated   central venous pressure.  Patient Profile     Mr. College is an 82 year old man with CAD, permanent atrial fibrillation, hypertension, hyperlipidemia, prior stroke, status post bioprosthetic AVR, CKD 3, and prior GI bleed requiring transfusion here with community-acquired pneumonia and acute on chronic diastolic heart failure.   Assessment & Plan    # Acute on chronic diastolic heart failure:  Mr. Dimuzio has improved with diuresis. He was -3.1 L yesterday. He continues to be volume overloaded on exam.  Given that his renal function is improving with diuresis we will continue with IV Lasix today. Hyponatremia is slightly better (130 to 131).   # s/p bioprosthetic AVR:  Stable.  Mean gradient 14 mmHg 03/2016.    # Permanent atrial fibrillation:  rates are well-controlled. He is not on anticoagulation due to GI bleeding and has required transfusion this admission. Continue aspirin.   # CKD 3: Baseline creatinine is around 2.1.  However, this is improving with diuresis.  We will continue to monitor.   # Hypertension: Blood pressure is well-controlled. Continue carvedilol and Imdur.   # CAD:  # s/p CVA: Not an active issue.  Continue aspirin, carvedilol, and Imdur.  He is not on a statin.  This is presumably due to age. To be addressed by his primary cardiologist in the outpatient setting if deemed indicated.  We will check fasting lipids tomorrow.    Signed, Skeet Latch, MD  05/13/2016, 8:38 AM

## 2016-05-13 NOTE — Progress Notes (Signed)
Family Medicine Teaching Service Daily Progress Note Intern Pager: [phone]  Patient [name] Medical record number: [id number] Date of birth: 1927/08/18 Age: 82 y.o. Gender: male  Primary Care Provider: [name], MD Consultants: none Code Status: DNR/DNI  Pt Overview and Major Events to Date:  11/23 - admit to FPTS for SOB and weakness. Ruled out PE with VQ scan, LE dopplers no DVT. Rocephin azithro started for presumed CAP. 11/24 - transfused 1U PRBCs > hgb 9.0 11/26 - considered transitioning to PO abx, but allergic to augmentin, so will plan for [facility] as outpatient. 1 episode of ? Decreased alertness. Added back duonebs and gave IV lasix 40mg  x 1, as well as solumedrol 125mg  x 1 11/27 - abx course completed. 11/28 - added Spiriva 11/30 - steroid course completed. HF team consulted for additional diuresis.  12/2 switched to torsemide 20 mg BID 12/3 changed back to IV lasix    Assessment and Plan: [name] is a 82 y.o. male presenting with weakness and shortness of breath in setting of recent URI treated as CAP with concomitant CHF exacerbation. PMH is significant for CHF, HTN, CAD, hx of CVA with residual weakness, T2DM, anemia in the setting of angiodysplasia of stomach, history of prostate cancer.   Dyspnea 2/2 CHF exacerbation/?undiagnosed COPD. Diuretic regimen per HF team, back on IV lasix 12/3 and 12/4. UOP 3.1L yesterday. Continued bilateral LE edema on exam today.  - HF team consulted, appreciate recs - IV lasix 40mg  BID - Duonebs q6h PRN - continue Spiriva controller  - cardiac monitoring - K 3.4 today, will replete with 42meQ KDur once - daily weights and I&Os - PT- HHPT, 24h supervision, OT- no OT f/u - SNF at [facility]   Chronic Hyponatremia , improving - Hypervolemic, likely due to CHF. Na 128 on admission. Today Na 131, improving with diuresis  - Continue fluid restrict  - Continue to monitor  Possible Community Acquired  Pneumonia, resolved.  Afebrile, no leukocytosis. Patient completed treatment, s/p 5 days rocephin and azithromycin, CXR improved from admission.  Stable Normocytic Anemia - chronic anemia w/ hx of iron deficiency, CKD, and recent GI bleed 2/2 angiodysplasia of stomach. S/p 1 unit pRBCs on 11/23 for Hgb 8.0 on admission - continue home iron supplementation - Transfusion threshold is 8 - monitor CBCs  AKI on CKD3, Stable Baseline appears to be ~2.10. Now near baseline but with some worsening in setting of diuresis. - diuresis with Lasix 40mg  IV BID, per HF team  - avoid nephrotoxic medications - monitor daily  A fib- currently in a fib, rate controlled, on coreg at home. Takes aspirin daily. - continue Aspirin 81mg  daily and Coreg 6.25mg  bid - monitor on telemetry  Type 2 Diabetes-  A1C 6.6. Takes 40 units Lantus every morning and 3-7 units of Novolog TID. Total SSI 11units over the past 24 hours. - Continue Lantus at 40 units daily - Continue moderate SSI  - CBG AC/HS  HTN- on coreg and norvasc at home. Controlled. - holding norvasc due to LE edema - continue home medications  Constipation- Resolved.  - Continue colace and miralax  Depression- stable, takes Lexapro 10mg  daily -continue home medication  Angiodysplasia of stomach, last bleed 09/2015.  On 40 mg protonix at home also on ASA. - use SCDs rather than lovenox ppx - protonix 40 mg BID  FEN/GI: carb modified/heart healthy diet, protonix  Prophylaxis: SCDs  Disposition: SNF placement  Subjective:  Doing well this morning.  No complaints, would love to go home, but understands the need for additional diuresis.   Objective: Temp:  [98.4 F (36.9 C)-98.7 F (37.1 C)] 98.4 F (36.9 C) (12/04 0508) Pulse Rate:  [62-65] 65 (12/04 0508) Resp:  [16] 16 (12/03 2119) BP: (106-125)/(40-52) 125/46 (12/04 0508) SpO2:  [99 %-100 %] 99 % (12/04 0508) Weight:  [231 lb 11.2 oz (105.1 kg)] 231 lb 11.2 oz (105.1 kg) (12/04  [id number]) Physical Exam:  General: Pleasant, sitting up in bed Cardiovascular: RRR, no m/r/g Respiratory: Normal work of breathing,CTAB, diminished air movement at bases.  Gastrointestinal: soft, nontender, nondistended, normoactive BS MSK: 2+ pitting edema to the knee bilaterally, no sacral edema, moves all extremities equally Neuro: Awake, alert, no focal deficits  Laboratory: 05/06/2016 ABG 7.418/40.8/64.6/26.0 (Normal blood gas profile)  Recent Labs Lab 05/10/16 0255 05/12/16 0418 05/13/16 0435  WBC 7.2 10.0 7.6  HGB 9.1* 9.2* 9.1*  HCT 28.1* 28.7* 27.8*  PLT 324 326 334    Recent Labs Lab 05/11/16 0349 05/12/16 0418 05/13/16 0435  NA 132* 130* 131*  K 3.6 4.2 3.4*  CL 92* 91* 92*  CO2 30 30 30   BUN 69* 66* 65*  CREATININE 2.22* 2.40* 2.33*  CALCIUM 8.9 8.4* 8.4*  GLUCOSE 78 194* 71    Hgb A1c 6.6  Imaging/Diagnostic Tests: No new imaging today.   Sela Hilding, MD 05/13/2016, 9:27 AM PGY-1, [place] Intern pager: [phone], text pages welcome

## 2016-05-13 NOTE — Progress Notes (Signed)
Physical Therapy Treatment Patient Details Name: EMIR STROMMER MRN: [id number] DOB: 02-01-1928 Today's Date: 05/13/2016    History of Present Illness 82 y.o. male presenting with weakness and shortness of breath in setting of recent URI. PMH is significant for CHF, HTN, CAD, hx of CVA with residual weakness, T2DM, anemia in the setting of angiodysplasia of stomach, history of prostate cancer    PT Comments    Pt eager for OOB and ambulation.  Pt able to increase ambulation distance at this time and only required one seated rest break.  Pt would continue to benefit from therapies at a SNF level to maximize independence and decrease overall burden of care.    Follow Up Recommendations  SNF     Equipment Recommendations  None recommended by PT    Recommendations for Other Services       Precautions / Restrictions Precautions Precautions: Fall Restrictions Weight Bearing Restrictions: No    Mobility  Bed Mobility Overal bed mobility: Needs Assistance Bed Mobility: Supine to Sit     Supine to sit: Min assist;HOB elevated     General bed mobility comments: A with bringing trunk up to sitting only.    Transfers Overall transfer level: Needs assistance Equipment used: Rolling walker (2 wheeled) Transfers: Sit to/from Stand Sit to Stand: Min assist         General transfer comment: pt needs cues for UE use.    Ambulation/Gait Ambulation/Gait assistance: Min guard Ambulation Distance (Feet): 120 Feet (and150) Assistive device: Rolling walker (2 wheeled) Gait Pattern/deviations: Step-through pattern;Decreased stride length;Trunk flexed     General Gait Details: pt moves slowly with a flexed posture.  pt able to increase ambulation distance today and is only limited by c/o fatigue needing a sitting rest break.     Stairs            Wheelchair Mobility    Modified Rankin (Stroke Patients Only)       Balance Overall balance assessment: Needs  assistance Sitting-balance support: No upper extremity supported;Feet supported Sitting balance-[name]: Good     Standing balance support: Bilateral upper extremity supported;Single extremity supported;During functional activity Standing balance-[name]: Poor                      Cognition Arousal/Alertness: Awake/alert Behavior During Therapy: WFL for tasks assessed/performed Overall Cognitive Status: Within Functional Limits for tasks assessed                      Exercises      General Comments        Pertinent Vitals/Pain Pain Assessment: No/denies pain    Home Living                      Prior Function            PT Goals (current goals can now be found in the care plan section) Acute Rehab PT Goals Patient Stated Goal: to get stronger PT Goal Formulation: With patient Time For Goal Achievement: 05/17/16 Potential to Achieve Goals: Good Progress towards PT goals: Progressing toward goals    Frequency    Min 3X/week      PT Plan Current plan remains appropriate    Co-evaluation             End of Session Equipment Utilized During Treatment: Gait belt Activity Tolerance: Patient tolerated treatment well Patient left: in chair;with call bell/phone within reach;with chair alarm  set     Time: [id number] PT Time Calculation (min) (ACUTE ONLY): 29 min  Charges:  $Gait Training: 23-37 mins                    G CodesCatarina Hartshorn, [place]  [id number] 05/13/2016, 10:13 AM

## 2016-05-14 DIAGNOSIS — N170 Acute kidney failure with tubular necrosis: Secondary | ICD-10-CM

## 2016-05-14 DIAGNOSIS — N184 Chronic kidney disease, stage 4 (severe): Secondary | ICD-10-CM

## 2016-05-14 LAB — BASIC METABOLIC PANEL
Anion gap: 10 (ref 5–15)
BUN: 67 mg/dL — AB (ref 6–20)
CHLORIDE: 90 mmol/L — AB (ref 101–111)
CO2: 30 mmol/L (ref 22–32)
CREATININE: 2.42 mg/dL — AB (ref 0.61–1.24)
Calcium: 8.6 mg/dL — ABNORMAL LOW (ref 8.9–10.3)
GFR calc Af Amer: 26 mL/min — ABNORMAL LOW (ref >60)
GFR, EST NON AFRICAN AMERICAN: 23 mL/min — AB (ref >60)
Glucose, Bld: 159 mg/dL — ABNORMAL HIGH (ref 65–99)
Potassium: 4 mmol/L (ref 3.5–5.1)
SODIUM: 130 mmol/L — AB (ref 135–145)

## 2016-05-14 LAB — CBC
HCT: 27.2 % — ABNORMAL LOW (ref 39.0–52.0)
Hemoglobin: 8.7 g/dL — ABNORMAL LOW (ref 13.0–17.0)
MCH: 27.4 pg (ref 26.0–34.0)
MCHC: 32 g/dL (ref 30.0–36.0)
MCV: 85.8 fL (ref 78.0–100.0)
PLATELETS: 335 10*3/uL (ref 150–400)
RBC: 3.17 MIL/uL — ABNORMAL LOW (ref 4.22–5.81)
RDW: 14.9 % (ref 11.5–15.5)
WBC: 7.3 10*3/uL (ref 4.0–10.5)

## 2016-05-14 LAB — LIPID PANEL
Cholesterol: 100 mg/dL (ref 0–200)
HDL: 48 mg/dL (ref >40)
LDL Cholesterol: 47 mg/dL (ref 0–99)
Total CHOL/HDL Ratio: 2.1 RATIO
Triglycerides: 25 mg/dL (ref <150)
VLDL: 5 mg/dL (ref 0–40)

## 2016-05-14 LAB — GLUCOSE, CAPILLARY
GLUCOSE-CAPILLARY: 192 mg/dL — AB (ref 65–99)
Glucose-Capillary: 239 mg/dL — ABNORMAL HIGH (ref 65–99)
Glucose-Capillary: 306 mg/dL — ABNORMAL HIGH (ref 65–99)
Glucose-Capillary: 220 mg/dL — ABNORMAL HIGH (ref 65–99)

## 2016-05-14 LAB — BRAIN NATRIURETIC PEPTIDE: B Natriuretic Peptide: 266.9 pg/mL — ABNORMAL HIGH (ref 0.0–100.0)

## 2016-05-14 MED ORDER — FUROSEMIDE 80 MG PO TABS
80.0000 mg | ORAL_TABLET | Freq: Every day | ORAL | Status: DC
  Administered 2016-05-15 – 2016-05-16 (×2): 80 mg via ORAL
  Filled 2016-05-14 (×2): qty 1

## 2016-05-14 NOTE — Progress Notes (Signed)
Copy of home sliding scale printed and placed in chart located at nurses station.  Notified [place], MD was to come and look at home doses vs hospital doses.

## 2016-05-14 NOTE — Progress Notes (Signed)
Family Medicine Teaching Service Daily Progress Note Intern Pager: [phone]  Patient [name] Medical record number: [id number] Date of birth: 1928-01-02 Age: 82 y.o. Gender: male  Primary Care Provider: [name], MD Consultants: none Code Status: DNR/DNI  Pt Overview and Major Events to Date:  11/23 - admit to FPTS for SOB and weakness. Ruled out PE with VQ scan, LE dopplers no DVT. Rocephin azithro started for presumed CAP. 11/24 - transfused 1U PRBCs > hgb 9.0 11/26 - considered transitioning to PO abx, but allergic to augmentin, so will plan for [facility] as outpatient. 1 episode of ? Decreased alertness. Added back duonebs and gave IV lasix 40mg  x 1, as well as solumedrol 125mg  x 1 11/27 - abx course completed. 11/28 - added Spiriva 11/30 - steroid course completed. HF team consulted for additional diuresis.  12/2 switched to torsemide 20 mg BID 12/3 changed back to IV lasix   Assessment and Plan: [name] is a 82 y.o. male presenting with weakness and shortness of breath in setting of recent URI treated as CAP with concomitant CHF exacerbation. PMH is significant for CHF, HTN, CAD, hx of CVA with residual weakness, T2DM, anemia in the setting of angiodysplasia of stomach, history of prostate cancer.   Dyspnea 2/2 CHF exacerbation/?undiagnosed COPD. Diuretic regimen per HF team, back on IV lasix until 12/5 am. Lasix holiday this afternoon. UOP 1.4L yesterday, although net positive 51mL, net -23L for admission. Decreased bilateral LE edema on exam today.  - HF team consulted, appreciate recs: possibly transition to PO lasix 12/6 - Duonebs q6h PRN - continue Spiriva controller  - cardiac monitoring - daily weights and I&Os - PT- HHPT, 24h supervision, OT- no OT f/u - SNF at [facility] [place] checked and he has a bed today  Chronic Hyponatremia , stable- Hypervolemic, likely due to CHF. Na 128 on admission. Today Na 130, stable. - Continue fluid restrict  -  Continue to monitor  Possible Community Acquired Pneumonia, resolved.  Afebrile, no leukocytosis. Patient completed treatment, s/p 5 days rocephin and azithromycin, CXR improved from admission.  Stable Normocytic Anemia - chronic anemia w/ hx of iron deficiency, CKD, and recent GI bleed 2/2 angiodysplasia of stomach. S/p 1 unit pRBCs on 11/23 for Hgb 8.0 on admission - continue home iron supplementation - Transfusion threshold is 8 - monitor CBCs  AKI on CKD3 Baseline ~2.10. Now near baseline but with some worsening in setting of diuresis. Cr 2.33>2.42.  - diuresis with Lasix 40mg  IV BID, per HF team - avoid nephrotoxic medications - monitor daily  A fib- currently in a fib, rate controlled, on coreg at home. Takes aspirin daily. - continue Aspirin 81mg  daily and Coreg 6.25mg  bid - monitor on telemetry  Type 2 Diabetes-  A1C 6.6. Takes 40 units Lantus every morning and 3-7 units of Novolog TID. Total SSI 23units over the past 24 hours due to getting Ensure as well as a late sugar check last night. - Continue Lantus at 40 units daily - Obtain and possibly change to home sliding scale per son - CBG AC/HS  HTN- on coreg and norvasc at home. Controlled. - holding norvasc due to LE edema - continue home medications  Constipation- Resolved.  - Continue colace and miralax  Depression- stable, takes Lexapro 10mg  daily -continue home medication  Angiodysplasia of stomach, last bleed 09/2015.  On 40 mg protonix at home also on ASA. - use SCDs rather than lovenox ppx - protonix 40 mg  BID  FEN/GI: carb modified/heart healthy diet, protonix  Prophylaxis: SCDs  Disposition: SNF placement  Subjective:  Feels well today, no pain. Says he doesn't need the nutritional supplement shakes unless he skips meals at home, hasn't used them in some time.  Objective: Temp:  [98.3 F (36.8 C)-98.7 F (37.1 C)] 98.3 F (36.8 C) (12/05 0550) Pulse Rate:  [56-67] 56 (12/05 0550) Resp:   [14-18] 14 (12/05 0550) BP: (119-151)/(49-62) 151/49 (12/05 0550) SpO2:  [98 %-99 %] 99 % (12/05 0550) Weight:  [227 lb (103 kg)] 227 lb (103 kg) (12/05 0550) Physical Exam:  General: Pleasant, sitting up in chair Cardiovascular: RRR, no m/r/g Respiratory: Normal work of breathing,CTAB, no wheezing. Gastrointestinal: soft, nontender, nondistended, normoactive BS MSK: 1+ pitting edema to the knee bilaterally, no sacral edema, moves all extremities equally Neuro: Awake, alert, no focal deficits  Laboratory: 05/06/2016 ABG 7.418/40.8/64.6/26.0 (Normal blood gas profile)  Recent Labs Lab 05/12/16 0418 05/13/16 0435 05/14/16 0420  WBC 10.0 7.6 7.3  HGB 9.2* 9.1* 8.7*  HCT 28.7* 27.8* 27.2*  PLT 326 334 335    Recent Labs Lab 05/12/16 0418 05/13/16 0435 05/14/16 0420  NA 130* 131* 130*  K 4.2 3.4* 4.0  CL 91* 92* 90*  CO2 30 30 30   BUN 66* 65* 67*  CREATININE 2.40* 2.33* 2.42*  CALCIUM 8.4* 8.4* 8.6*  GLUCOSE 194* 71 159*    Hgb A1c 6.6  Imaging/Diagnostic Tests: No new imaging today.   Sela Hilding, MD 05/14/2016, 7:18 AM PGY-1, [place] Intern pager: [phone], text pages welcome

## 2016-05-14 NOTE — Progress Notes (Signed)
Patient [name] Date of Encounter: 05/14/2016  Primary Cardiologist: Dr [facility] Problem List     Active Problems:   Pneumonia   Shortness of breath   Anemia of chronic disease   Acute on chronic congestive heart failure (HCC)   Muscle weakness (generalized)   COPD exacerbation (HCC)   Subjective   Feels he is breathing pretty well today  Inpatient Medications    Scheduled Meds: . aspirin EC  81 mg Oral Daily  . carvedilol  6.25 mg Oral BID  . cholecalciferol  1,000 Units Oral Daily  . escitalopram  10 mg Oral Daily  . ferrous sulfate  325 mg Oral Q breakfast  . furosemide  40 mg Intravenous BID  . hydroxychloroquine  200 mg Oral Daily  . insulin aspart  0-5 Units Subcutaneous QHS  . insulin aspart  0-9 Units Subcutaneous TID WC  . insulin glargine  40 Units Subcutaneous BH-q7a  . isosorbide mononitrate  30 mg Oral Daily  . multivitamin  1 tablet Oral Daily  . multivitamin with minerals  1 tablet Oral BID  . pantoprazole  40 mg Oral BID  . polyethylene glycol  17 g Oral Daily  . tamsulosin  0.4 mg Oral QPC supper  . tiotropium  18 mcg Inhalation Daily  . vitamin C  500 mg Oral Daily   Continuous Infusions:  PRN Meds: acetaminophen, guaiFENesin-dextromethorphan, ipratropium-albuterol, technetium TC 47M diethylenetriame-pentaacetic acid   Vital Signs    Vitals:   05/13/16 1420 05/13/16 2013 05/14/16 0550 05/14/16 0814  BP: 129/62 (!) 119/51 (!) 151/49   Pulse: 64 67 (!) 56 (!) 37  Resp: 18 18 14 17   Temp: 98.6 F (37 C) 98.7 F (37.1 C) 98.3 F (36.8 C)   TempSrc: Oral Oral Oral   SpO2: 98% 99% 99% 99%  Weight:   227 lb (103 kg)   Height:        Intake/Output Summary (Last 24 hours) at 05/14/16 0819 Last data filed at 05/14/16 0500  Gross per 24 hour  Intake             1412 ml  Output             1375 ml  Net               37 ml   Filed Weights   05/12/16 0520 05/13/16 0508 05/14/16 0550  Weight: 235 lb 4.8 oz (106.7 kg)  231 lb 11.2 oz (105.1 kg) 227 lb (103 kg)    Physical Exam    GEN: Well nourished, well developed, in no acute distress.  HEENT: Grossly normal.  Neck: Supple, JVD 10 cm, +HJR, no carotid bruits, or masses. Cardiac: Irreg R&R, 2/6 murmurs, no rubs, or gallops. No clubbing, cyanosis, edema.  Radials/DP/PT 2+ and equal bilaterally.  Respiratory:  Respirations regular and unlabored, decreased BS bases w/ rales. GI: Soft, nontender, nondistended, BS + x 4. MS: no deformity or atrophy. Skin: warm and dry, no rash. Neuro:  Strength and sensation are intact. Psych: AAOx3.  Normal affect.  Labs    CBC  Recent Labs  05/13/16 0435 05/14/16 0420  WBC 7.6 7.3  HGB 9.1* 8.7*  HCT 27.8* 27.2*  MCV 84.5 85.8  PLT 334 [id number]   Basic Metabolic Panel  Recent Labs  05/11/16 0921  05/13/16 0435 05/14/16 0420  NA  --   < > 131* 130*  K  --   < > 3.4* 4.0  CL  --   < > 92* 90*  CO2  --   < > 30 30  GLUCOSE  --   < > 71 159*  BUN  --   < > 65* 67*  CREATININE  --   < > 2.33* 2.42*  CALCIUM  --   < > 8.4* 8.6*  MG 2.0  --   --   --   < > = values in this interval not displayed. Fasting Lipid Panel  Recent Labs  05/14/16 0420  CHOL 100  HDL 48  LDLCALC 47  TRIG 25  CHOLHDL 2.1    Telemetry    Atrial fib, occ PVCs and pairs, rate 50s at times - Personally Reviewed  ECG    n/a - Personally Reviewed  Radiology    Dg Chest 2 View Result Date: 05/02/2016 CLINICAL DATA:  Dry cough.  History of hypertension and diabetes. EXAM: CHEST  2 VIEW COMPARISON:  03/22/2016; 07/10/2015; 06/27/2014 FINDINGS: Grossly unchanged enlarged cardiac silhouette and mediastinal contours given slightly reduced lung volumes. Post median sternotomy. The pulmonary vasculature appears less distinct than present examination with cephalization of flow. Worsening bibasilar opacities, left greater than right. Interval development of small bilateral effusions, left greater than right. No pneumothorax. No acute  osseus abnormalities. IMPRESSION: Findings most suggestive of pulmonary edema with small bilateral effusions and associated bibasilar opacities, left greater than right, atelectasis versus infiltrate. Electronically Signed   By: Sandi Mariscal M.D.   On: 05/02/2016 10:28   Dg Chest Port 1 View Result Date: 05/05/2016 CLINICAL DATA:  Shortness of breath. EXAM: PORTABLE CHEST 1 VIEW COMPARISON:  05/02/2016. FINDINGS: Trachea is midline. Heart is enlarged. Lungs are clear. No pleural fluid. IMPRESSION: No acute findings. Electronically Signed   By: Lorin Picket M.D.   On: 05/05/2016 18:50    Cardiac Studies   Echo 03/22/16: - Left ventricle: The cavity size was normal. There was mild concentric hypertrophy. Systolic function was normal. The estimated ejection fraction was in the range of 55% to 60%. Wall motion was normal; there were no regional wall motion abnormalities. The study is not technically sufficient to allow evaluation of LV diastolic function. - Aortic valve: A bioprosthesis was present. Transvalvular velocity was within the normal range for prosthetic valve Peak velocity (S): 241 cm/s. Mean gradient (S): 14 mm Hg. Peak gradient (S): 23 mm Hg. - Left atrium: The atrium was mild to moderately dilated. - Right ventricle: Systolic function was normal. - Pulmonary arteries: Systolic pressure could not be accurately estimated. - Inferior vena cava: The vessel was dilated. The respirophasic diameter changes were blunted (<50%), consistent with elevated central venous pressure.  Patient Profile     Mr. Kontos is an 82 year old man with CAD, permanent atrial fibrillation, hypertension, hyperlipidemia, prior stroke, status post bioprosthetic AVR, CKD 3, and prior GI bleed requiring transfusion here with community-acquired pneumonia and acute on chronic diastolic heart failure.   Assessment & Plan    # Acute on chronic diastolic heart failure:  Mr. Dean  has improved with diuresis. He was +37 cc yesterday, but _ 23.2L since admit. He continues to be volume overloaded on exam.  His BUN/Cr have increased slightly but he needs additional volume off, continue with IV Lasix this am. Hyponatremia stable at 130.   # s/p bioprosthetic AVR:  Stable.  Mean gradient 14 mmHg 03/2016.    # Permanent atrial fibrillation:  rates are well-controlled. He is not on anticoagulation due to GI bleeding and has  required transfusion this admission. Continue aspirin.    # CKD 3: Baseline creatinine is around 2.1. Slightly higher today. We will continue to monitor.   # Hypertension: Blood pressure is well-controlled. Continue carvedilol and Imdur.   # CAD:  # s/p CVA: Not active issues. No ischemic symptoms. Continue aspirin, carvedilol, and Imdur.  He is not on a statin.  This is presumably due to age. To be addressed by his primary cardiologist in the outpatient setting if deemed indicated.  Fasting lipids are above.      Jonetta Speak, PA-C  05/14/2016, 8:19 AM

## 2016-05-15 ENCOUNTER — Telehealth: Payer: Self-pay | Admitting: Cardiovascular Disease

## 2016-05-15 LAB — BASIC METABOLIC PANEL
Anion gap: 13 (ref 5–15)
BUN: 66 mg/dL — AB (ref 6–20)
CALCIUM: 8.8 mg/dL — AB (ref 8.9–10.3)
CO2: 29 mmol/L (ref 22–32)
CREATININE: 2.48 mg/dL — AB (ref 0.61–1.24)
Chloride: 90 mmol/L — ABNORMAL LOW (ref 101–111)
GFR calc Af Amer: 25 mL/min — ABNORMAL LOW (ref >60)
GFR, EST NON AFRICAN AMERICAN: 22 mL/min — AB (ref >60)
GLUCOSE: 288 mg/dL — AB (ref 65–99)
POTASSIUM: 4.3 mmol/L (ref 3.5–5.1)
Sodium: 132 mmol/L — ABNORMAL LOW (ref 135–145)

## 2016-05-15 LAB — CBC
HEMATOCRIT: 28 % — AB (ref 39.0–52.0)
Hemoglobin: 8.9 g/dL — ABNORMAL LOW (ref 13.0–17.0)
MCH: 27.1 pg (ref 26.0–34.0)
MCHC: 31.8 g/dL (ref 30.0–36.0)
MCV: 85.4 fL (ref 78.0–100.0)
Platelets: 324 10*3/uL (ref 150–400)
RBC: 3.28 MIL/uL — ABNORMAL LOW (ref 4.22–5.81)
RDW: 14.9 % (ref 11.5–15.5)
WBC: 6.6 10*3/uL (ref 4.0–10.5)

## 2016-05-15 LAB — GLUCOSE, CAPILLARY
GLUCOSE-CAPILLARY: 299 mg/dL — AB (ref 65–99)
Glucose-Capillary: 287 mg/dL — ABNORMAL HIGH (ref 65–99)
Glucose-Capillary: 118 mg/dL — ABNORMAL HIGH (ref 65–99)

## 2016-05-15 MED ORDER — INSULIN ASPART 100 UNIT/ML ~~LOC~~ SOLN
0.0000 [IU] | Freq: Three times a day (TID) | SUBCUTANEOUS | Status: DC
  Administered 2016-05-15: 8 [IU] via SUBCUTANEOUS
  Administered 2016-05-16: 4 [IU] via SUBCUTANEOUS
  Administered 2016-05-16: 3 [IU] via SUBCUTANEOUS

## 2016-05-15 MED ORDER — INSULIN ASPART 100 UNIT/ML ~~LOC~~ SOLN: 0.0000 [IU] | Freq: Every day | SUBCUTANEOUS | Status: DC

## 2016-05-15 NOTE — Progress Notes (Signed)
Patient [name] Date of Encounter: 05/15/2016  Primary Cardiologist: Dr [facility] Problem List     Active Problems:   Pneumonia   Shortness of breath   Anemia of chronic disease   Acute on chronic congestive heart failure (HCC)   Muscle weakness (generalized)   COPD exacerbation (HCC)    Subjective   Pt doing pretty well, gets DOE and is weak. Wants to get stronger. Had 600 cc fluid already this am, between coffee, juice and water.  Inpatient Medications    Scheduled Meds: . aspirin EC  81 mg Oral Daily  . carvedilol  6.25 mg Oral BID  . cholecalciferol  1,000 Units Oral Daily  . escitalopram  10 mg Oral Daily  . ferrous sulfate  325 mg Oral Q breakfast  . furosemide  80 mg Oral Daily  . hydroxychloroquine  200 mg Oral Daily  . insulin aspart  0-9 Units Subcutaneous TID WC  . insulin aspart  0-9 Units Subcutaneous QHS  . insulin glargine  40 Units Subcutaneous BH-q7a  . isosorbide mononitrate  30 mg Oral Daily  . multivitamin  1 tablet Oral Daily  . multivitamin with minerals  1 tablet Oral BID  . pantoprazole  40 mg Oral BID  . polyethylene glycol  17 g Oral Daily  . tamsulosin  0.4 mg Oral QPC supper  . tiotropium  18 mcg Inhalation Daily  . vitamin C  500 mg Oral Daily   Continuous Infusions:  PRN Meds: acetaminophen, guaiFENesin-dextromethorphan, ipratropium-albuterol, technetium TC 24M diethylenetriame-pentaacetic acid   Vital Signs    Vitals:   05/14/16 1421 05/14/16 2026 05/15/16 0425 05/15/16 0747  BP: (!) 112/48 (!) 139/49 (!) 141/68   Pulse: (!) 56 66 63   Resp: 18     Temp: 97.7 F (36.5 C) 98.5 F (36.9 C) 98.6 F (37 C)   TempSrc: Oral Oral Oral   SpO2: 99% 100% 99% 99%  Weight:   226 lb 1.6 oz (102.6 kg)   Height:        Intake/Output Summary (Last 24 hours) at 05/15/16 1006 Last data filed at 05/15/16 0554  Gross per 24 hour  Intake              780 ml  Output             1175 ml  Net             -395 ml    Filed Weights   05/13/16 0508 05/14/16 0550 05/15/16 0425  Weight: 231 lb 11.2 oz (105.1 kg) 227 lb (103 kg) 226 lb 1.6 oz (102.6 kg)    Physical Exam    GEN: Well nourished, well developed, in no acute distress.  HEENT: Grossly normal.  Neck: Supple, minimal JVD, no carotid bruits, or masses. Cardiac: Irreg R&R, 2/6 murmurs, rubs, or gallops. No clubbing, cyanosis.  2+ pitting edema to lower tibia bilaterally.  Radials/DP/PT 2+ and equal bilaterally.  Respiratory:  Respirations regular and unlabored, decreased BS bases bilaterally w/ few rales GI: Soft, nontender, nondistended, BS + x 4. MS: no deformity or atrophy. Skin: warm and dry, no rash. Neuro:  Strength and sensation are intact. Psych: AAOx3.  Normal affect.  Labs    CBC  Recent Labs  05/14/16 0420 05/15/16 0433  WBC 7.3 6.6  HGB 8.7* 8.9*  HCT 27.2* 28.0*  MCV 85.8 85.4  PLT 335 0000000   Basic Metabolic Panel  Recent Labs  05/14/16 0420  05/15/16 0433  NA 130* 132*  K 4.0 4.3  CL 90* 90*  CO2 30 29  GLUCOSE 159* 288*  BUN 67* 66*  CREATININE 2.42* 2.48*  CALCIUM 8.6* 8.8*   Fasting Lipid Panel  Recent Labs  05/14/16 0420  CHOL 100  HDL 48  LDLCALC 47  TRIG 25  CHOLHDL 2.1   Telemetry    Atrial fib, rate generally ok - Personally Reviewed  ECG    n/a - Personally Reviewed  Radiology    No results found.  Cardiac Studies   Echo 03/22/16: - Left ventricle: The cavity size was normal. There was mild concentric hypertrophy. Systolic function was normal. The estimated ejection fraction was in the range of 55% to 60%. Wall motion was normal; there were no regional wall motion abnormalities. The study is not technically sufficient to allow evaluation of LV diastolic function. - Aortic valve: A bioprosthesis was present. Transvalvular velocity was within the normal range for prosthetic valve Peak velocity (S): 241 cm/s. Mean gradient (S): 14 mm Hg. Peak gradient (S):  23 mm Hg. - Left atrium: The atrium was mild to moderately dilated. - Right ventricle: Systolic function was normal. - Pulmonary arteries: Systolic pressure could not be accurately estimated. - Inferior vena cava: The vessel was dilated. The respirophasic diameter changes were blunted (<50%), consistent with elevated central venous pressure.  Patient Profile     Mr. Boot is an 82 year old man with CAD, permanent atrial fibrillation, hypertension, hyperlipidemia, prior stroke, status post bioprosthetic AVR, CKD 3, and prior GI bleed requiring transfusion here with community-acquired pneumonia and acute on chronic diastolic heart failure.   Assessment & Plan    # Acute on chronic diastolic heart failure:Mr. Labella has improved with diuresis. He was +85 cc yesterday, but - 23.1L since admit. His volume status is improved on exam and his weight is down another pound. His BUN/Cr have increased slightly.  We will start po Lasix 80 mg this am. We are in a tight situation, where keeping him completely euvolemic would be at the expense of his kidneys. Hyponatremia improved to 132. Continue 1200 cc fluid restrictions. Possible d/c in am if renal function stable and he can maintain an even to negative fluid balance.  Hopefully he can get a BMP next week at the SNF.  Early f/u in office.   # s/p bioprosthetic [id number]. Mean gradient 14 mmHg 03/2016.   # Permanent atrial fibrillation: Rates are well-controlled. He is not on anticoagulation due to GI bleeding and has required transfusion this admission. Continue aspirin and Coreg   # CKD 3: Baseline creatinineis around 2.1. Slightly higher today. We will continue to monitor.   # Hypertension:Blood pressure is well-controlled. Continue carvedilol and Imdur.   # CAD:  # s/p CVA: Not active issues. No ischemic symptoms.Continue aspirin, carvedilol, and Imdur. He is not on a statin. This is presumably due to age. To be  addressed by his primary cardiologist in the outpatient setting if deemed indicated. Fasting lipids are above.     Signed, Treniece Holsclaw C. Oval Linsey, MD, [facility]  05/15/2016  11:25 AM

## 2016-05-15 NOTE — Telephone Encounter (Signed)
7 day TOC pt-appt with Christell Faith 05-22-16 at [facility]

## 2016-05-15 NOTE — Clinical Social Work Note (Signed)
Clinical Social Worker continuing to follow patient and family for support and discharge planning needs.  Patient plans to discharge to [facility] once medically stable.  CSW remains available for support and to facilitate patient discharge needs once medically ready.  Barbette Or, [place]

## 2016-05-15 NOTE — Progress Notes (Signed)
Occupational Therapy Treatment Patient Details [name] MRN: [id number] DOB: 14-Feb-1928 Today's Date: 05/15/2016    History of present illness 82 y.o. male presenting with weakness and shortness of breath in setting of recent URI. PMH is significant for CHF, HTN, CAD, hx of CVA with residual weakness, T2DM, anemia in the setting of angiodysplasia of stomach, history of prostate cancer   OT comments  Pt making gradual progress toward OT goals this session. Able to complete grooming activities standing at the sink with min guard assist. Min assist overall for functional mobility. Pt quick to fatigue but no SOB with activity today. D/c plan remains appropriate. Will continue to follow acutely.   Follow Up Recommendations  SNF;Supervision/Assistance - 24 hour    Equipment Recommendations  Other (comment) (TBD)    Recommendations for Other Services      Precautions / Restrictions Precautions Precautions: Fall Precaution Comments: watch O2 saturations Restrictions Weight Bearing Restrictions: No       Mobility Bed Mobility Overal bed mobility: Needs Assistance Bed Mobility: Supine to Sit     Supine to sit: Min assist;HOB elevated     General bed mobility comments: Min hand held assist to pull trunk up into sitting position. Pt able to manage LEs to EOB and scooting hips out to EOB with increased time.   Transfers Overall transfer level: Needs assistance Equipment used: Rolling walker (2 wheeled) Transfers: Sit to/from Stand Sit to Stand: Min assist         General transfer comment: Min assist to boost up from EOB. Cues for hand placement and technique.    Balance Overall balance assessment: Needs assistance Sitting-balance support: Feet supported;No upper extremity supported Sitting balance-[name]: Good     Standing balance support: Single extremity supported;During functional activity Standing balance-[name]: Fair                      ADL Overall ADL's : Needs assistance/impaired Eating/Feeding: Set up;Sitting   Grooming: Min guard;Standing;Wash/dry hands;Oral care                   Toilet Transfer: Minimal assistance;Ambulation;RW Toilet Transfer Details (indicate cue type and reason): Simulated by sit to stand from EOB with functional mobility         Functional mobility during ADLs: Minimal assistance;Rolling walker General ADL Comments: Pt quick to fatigue this session; requesting to sit after grooming activity standing at the sink. No SOB noted during activity.      Vision                     Perception     Praxis      Cognition   Behavior During Therapy: WFL for tasks assessed/performed Overall Cognitive Status: Within Functional Limits for tasks assessed                       Extremity/Trunk Assessment               Exercises     Shoulder Instructions       General Comments      Pertinent Vitals/ Pain       Pain Assessment: No/denies pain  Home Living                                          Prior Functioning/Environment  Frequency  Min 2X/week        Progress Toward Goals  OT Goals(current goals can now be found in the care plan section)  Progress towards OT goals: Progressing toward goals  Acute Rehab OT Goals Patient Stated Goal: be able to go home OT Goal Formulation: With patient  Plan Discharge plan remains appropriate    Co-evaluation                 End of Session Equipment Utilized During Treatment: Gait belt;Rolling walker   Activity Tolerance Patient tolerated treatment well   Patient Left in chair;with call bell/phone within reach;with chair alarm set   Nurse Communication Mobility status;Other (comment) (condom cath fell off)        Time: [id number] OT Time Calculation (min): 15 min  Charges: OT General Charges $OT Visit: 1 Procedure OT Treatments $Self Care/Home  Management : 8-22 mins  Binnie Kand M.S., OTR/L Pager: [phone]  05/15/2016, 5:39 PM

## 2016-05-15 NOTE — Progress Notes (Signed)
Results for [name], [name] (MRN [id number]) as of 05/15/2016 11:01  Ref. Range 05/14/2016 11:02 05/14/2016 17:07 05/14/2016 20:26 05/15/2016 06:37 05/15/2016 07:32  Glucose-Capillary Latest Ref Range: 65 - 99 mg/dL 239 (H) 306 (H) 220 (H) 287 (H) 299 (H)   Noted that blood sugars continue to be greater than 180 mg/dl. Recommend increasing Novolog correction scale to MODERATE TID & HS and adding Novolog 4 units TID with meals if eating at least 50% of meals. Will continue to monitor blood sugars while in the hospital. Harvel Ricks RN BSN CDE

## 2016-05-15 NOTE — Progress Notes (Signed)
Physical Therapy Treatment Patient Details Name: MARTEL CHEZEM MRN: [id number] DOB: 1928-04-03 Today's Date: 05/15/2016    History of Present Illness 82 y.o. male presenting with weakness and shortness of breath in setting of recent URI. PMH is significant for CHF, HTN, CAD, hx of CVA with residual weakness, T2DM, anemia in the setting of angiodysplasia of stomach, history of prostate cancer    PT Comments    Pt increasing ambulation distance today with standing rest breaks. Con't to recommend SNF as pt lives alone and his bedroom is on 2nd floor.  Follow Up Recommendations  SNF     Equipment Recommendations  None recommended by PT    Recommendations for Other Services       Precautions / Restrictions Precautions Precautions: Fall Precaution Comments: watch O2 saturations Restrictions Weight Bearing Restrictions: No    Mobility  Bed Mobility               General bed mobility comments: Pt up in recliner upon arrival  Transfers Overall transfer level: Needs assistance Equipment used: Rolling walker (2 wheeled) Transfers: Sit to/from Stand Sit to Stand: Min guard         General transfer comment: cues for hand placement  Ambulation/Gait Ambulation/Gait assistance: Min guard Ambulation Distance (Feet): 250 Feet (plus 100) Assistive device: Rolling walker (2 wheeled) Gait Pattern/deviations: Decreased step length - right;Decreased step length - left;Trunk flexed Gait velocity: decreased   General Gait Details: Pt ambulates slowly with flexed posture and need for standing rest breaks plus a sitting rest break.  o2 stable.  His visitor brought recliner behind for him to sit on.   Stairs            Wheelchair Mobility    Modified Rankin (Stroke Patients Only)       Balance           Standing balance support: Bilateral upper extremity supported Standing balance-[name]: Poor Standing balance comment: requires B UE support                     Cognition Arousal/Alertness: Awake/alert Behavior During Therapy: WFL for tasks assessed/performed Overall Cognitive Status: Within Functional Limits for tasks assessed                      Exercises      General Comments        Pertinent Vitals/Pain Pain Assessment: No/denies pain    Home Living                      Prior Function            PT Goals (current goals can now be found in the care plan section) Acute Rehab PT Goals Patient Stated Goal: to get stronger PT Goal Formulation: With patient Time For Goal Achievement: 05/17/16 Potential to Achieve Goals: Good Progress towards PT goals: Progressing toward goals    Frequency    Min 3X/week      PT Plan Current plan remains appropriate    Co-evaluation             End of Session Equipment Utilized During Treatment: Gait belt Activity Tolerance: Patient tolerated treatment well Patient left: in chair;with call bell/phone within reach;with chair alarm set;with family/visitor present     Time: 1200-1225 PT Time Calculation (min) (ACUTE ONLY): 25 min  Charges:  $Gait Training: 23-37 mins  G Codes:      Sonnet Rizor LUBECK 05/15/2016, 1:59 PM

## 2016-05-15 NOTE — Progress Notes (Signed)
Family Medicine Teaching Service Daily Progress Note Intern Pager: [phone]  Patient [name] Medical record number: [id number] Date of birth: February 17, 1928 Age: 82 y.o. Gender: male  Primary Care Provider: [name], MD Consultants: none Code Status: DNR/DNI  Pt Overview and Major Events to Date:  11/23 - admit to FPTS for SOB and weakness. Ruled out PE with VQ scan, LE dopplers no DVT. Rocephin azithro started for presumed CAP. 11/24 - transfused 1U PRBCs > hgb 9.0 11/26 - considered transitioning to PO abx, but allergic to augmentin, so will plan for [facility] as outpatient. 1 episode of ? Decreased alertness. Added back duonebs and gave IV lasix 40mg  x 1, as well as solumedrol 125mg  x 1 11/27 - abx course completed. 11/28 - added Spiriva 11/30 - steroid course completed. HF team consulted for additional diuresis.  12/2 switched to torsemide 20 mg BID 12/3 changed back to IV lasix  12/5 PM lasix holiday, plan for PO lasix 80mg  on 12/6  Assessment and Plan: [name] is a 82 y.o. male presenting with weakness and shortness of breath in setting of recent URI treated as CAP with concomitant CHF exacerbation. PMH is significant for CHF, HTN, CAD, hx of CVA with residual weakness, T2DM, anemia in the setting of angiodysplasia of stomach, history of prostate cancer.   Dyspnea 2/2 CHF exacerbation/?undiagnosed COPD. Diuretic regimen per HF team, PO lasix 80mg  today. UOP 1.2L yesterday, net -23L for admission. Decreased bilateral LE edema on exam today.  - HF team consulted, appreciate recs:  PO lasix 80mg  QD - Duonebs q6h PRN - continue Spiriva controller  - cardiac monitoring - daily weights and I&Os - PT- HHPT, 24h supervision, OT- no OT f/u - SNF at [facility] [place], [place] checked and he has a bed today  Chronic Hyponatremia , stable- Hypervolemic, likely due to CHF. Na 128 on admission. Today Na 132, stable. - Continue fluid restrict  - Continue to monitor  Possible  Community Acquired Pneumonia, resolved.  Afebrile, no leukocytosis. Patient completed treatment, s/p 5 days rocephin and azithromycin, CXR improved from admission.  Stable Normocytic Anemia - chronic anemia w/ hx of iron deficiency, CKD, and recent GI bleed 2/2 angiodysplasia of stomach. S/p 1 unit pRBCs on 11/23 for Hgb 8.0 on admission - continue home iron supplementation - Transfusion threshold is 8 - monitor CBCs  AKI on CKD3 Baseline ~2.10. Now near baseline but with some worsening in setting of diuresis. Cr 2.33>2.42>2.48.  - diuresis with Lasix 80mg  PO QD, per HF team - avoid nephrotoxic medications - monitor daily  A fib- currently in a fib, rate controlled, on coreg at home. Takes aspirin daily. - continue Aspirin 81mg  daily and Coreg 6.25mg  bid - monitor on telemetry  Type 2 Diabetes-  A1C 6.6. Takes 40 units Lantus every morning and 3-7 units of Novolog TID. Total SSI 14units over the past 24 hours. - Continue Lantus at 40 units daily - changed sliding scale to home custom at request of son. Image of his home scale in media tab. - CBG AC/HS  HTN- on coreg and norvasc at home. Controlled. - holding norvasc due to LE edema - continue home medications  Constipation- Resolved.  - Continue colace and miralax  Depression- stable, takes Lexapro 10mg  daily -continue home medication  Angiodysplasia of stomach, last bleed 09/2015.  On 40 mg protonix at home also on ASA. - use SCDs rather than lovenox ppx - protonix 40 mg BID  FEN/GI: carb modified/heart healthy  diet, protonix  Prophylaxis: SCDs  Disposition: SNF placement  Subjective:  Feels well today, no pain. Sleeping peacefully and easily arousable for my exam. Reports he uses his sliding scale at home TID AC QHS.  Objective: Temp:  [97.7 F (36.5 C)-98.6 F (37 C)] 98.6 F (37 C) (12/06 0425) Pulse Rate:  [56-66] 63 (12/06 0425) Resp:  [18] 18 (12/05 1421) BP: (112-141)/(48-68) 141/68 (12/06 0425) SpO2:   [99 %-100 %] 99 % (12/06 0747) Weight:  [226 lb 1.6 oz (102.6 kg)] 226 lb 1.6 oz (102.6 kg) (12/06 0425) Physical Exam:  General: Pleasant, sitting up in chair Cardiovascular: RRR, no m/r/g Respiratory: Normal work of breathing,CTAB, no wheezing. Gastrointestinal: soft, nontender, nondistended, normoactive BS MSK: 1+ pitting edema to the knee bilaterally, no sacral edema, moves all extremities equally Neuro: Awake, alert, no focal deficits  Laboratory: 05/06/2016 ABG 7.418/40.8/64.6/26.0 (Normal blood gas profile)  Recent Labs Lab 05/13/16 0435 05/14/16 0420 05/15/16 0433  WBC 7.6 7.3 6.6  HGB 9.1* 8.7* 8.9*  HCT 27.8* 27.2* 28.0*  PLT 334 335 324    Recent Labs Lab 05/13/16 0435 05/14/16 0420 05/15/16 0433  NA 131* 130* 132*  K 3.4* 4.0 4.3  CL 92* 90* 90*  CO2 30 30 29   BUN 65* 67* 66*  CREATININE 2.33* 2.42* 2.48*  CALCIUM 8.4* 8.6* 8.8*  GLUCOSE 71 159* 288*    Hgb A1c 6.6  Imaging/Diagnostic Tests: No new imaging today.   Sela Hilding, MD 05/15/2016, 9:40 AM PGY-1, [place] Intern pager: [phone], text pages welcome

## 2016-05-16 DIAGNOSIS — I110 Hypertensive heart disease with heart failure: Secondary | ICD-10-CM | POA: Diagnosis not present

## 2016-05-16 DIAGNOSIS — Y999 Unspecified external cause status: Secondary | ICD-10-CM | POA: Diagnosis not present

## 2016-05-16 DIAGNOSIS — H1011 Acute atopic conjunctivitis, right eye: Secondary | ICD-10-CM | POA: Diagnosis not present

## 2016-05-16 DIAGNOSIS — I5022 Chronic systolic (congestive) heart failure: Secondary | ICD-10-CM | POA: Diagnosis not present

## 2016-05-16 DIAGNOSIS — E871 Hypo-osmolality and hyponatremia: Secondary | ICD-10-CM | POA: Diagnosis not present

## 2016-05-16 DIAGNOSIS — W19XXXA Unspecified fall, initial encounter: Secondary | ICD-10-CM | POA: Diagnosis not present

## 2016-05-16 DIAGNOSIS — Y929 Unspecified place or not applicable: Secondary | ICD-10-CM | POA: Diagnosis not present

## 2016-05-16 DIAGNOSIS — Z8673 Personal history of transient ischemic attack (TIA), and cerebral infarction without residual deficits: Secondary | ICD-10-CM | POA: Diagnosis not present

## 2016-05-16 DIAGNOSIS — H1031 Unspecified acute conjunctivitis, right eye: Secondary | ICD-10-CM | POA: Diagnosis not present

## 2016-05-16 DIAGNOSIS — I482 Chronic atrial fibrillation: Secondary | ICD-10-CM | POA: Diagnosis not present

## 2016-05-16 DIAGNOSIS — M6281 Muscle weakness (generalized): Secondary | ICD-10-CM | POA: Diagnosis not present

## 2016-05-16 DIAGNOSIS — Z7982 Long term (current) use of aspirin: Secondary | ICD-10-CM | POA: Diagnosis not present

## 2016-05-16 DIAGNOSIS — E1100 Type 2 diabetes mellitus with hyperosmolarity without nonketotic hyperglycemic-hyperosmolar coma (NKHHC): Secondary | ICD-10-CM | POA: Diagnosis not present

## 2016-05-16 DIAGNOSIS — H109 Unspecified conjunctivitis: Secondary | ICD-10-CM | POA: Diagnosis not present

## 2016-05-16 DIAGNOSIS — Z87891 Personal history of nicotine dependence: Secondary | ICD-10-CM | POA: Diagnosis not present

## 2016-05-16 DIAGNOSIS — R6889 Other general symptoms and signs: Secondary | ICD-10-CM | POA: Diagnosis not present

## 2016-05-16 DIAGNOSIS — N183 Chronic kidney disease, stage 3 (moderate): Secondary | ICD-10-CM | POA: Diagnosis not present

## 2016-05-16 DIAGNOSIS — W19XXXS Unspecified fall, sequela: Secondary | ICD-10-CM | POA: Diagnosis not present

## 2016-05-16 DIAGNOSIS — D638 Anemia in other chronic diseases classified elsewhere: Secondary | ICD-10-CM | POA: Diagnosis not present

## 2016-05-16 DIAGNOSIS — E1165 Type 2 diabetes mellitus with hyperglycemia: Secondary | ICD-10-CM | POA: Diagnosis not present

## 2016-05-16 DIAGNOSIS — Z7901 Long term (current) use of anticoagulants: Secondary | ICD-10-CM | POA: Diagnosis not present

## 2016-05-16 DIAGNOSIS — J449 Chronic obstructive pulmonary disease, unspecified: Secondary | ICD-10-CM | POA: Diagnosis not present

## 2016-05-16 DIAGNOSIS — E1140 Type 2 diabetes mellitus with diabetic neuropathy, unspecified: Secondary | ICD-10-CM | POA: Diagnosis not present

## 2016-05-16 DIAGNOSIS — S199XXA Unspecified injury of neck, initial encounter: Secondary | ICD-10-CM | POA: Diagnosis not present

## 2016-05-16 DIAGNOSIS — J189 Pneumonia, unspecified organism: Secondary | ICD-10-CM | POA: Diagnosis not present

## 2016-05-16 DIAGNOSIS — R2689 Other abnormalities of gait and mobility: Secondary | ICD-10-CM | POA: Diagnosis not present

## 2016-05-16 DIAGNOSIS — S0990XA Unspecified injury of head, initial encounter: Secondary | ICD-10-CM | POA: Diagnosis not present

## 2016-05-16 DIAGNOSIS — Z951 Presence of aortocoronary bypass graft: Secondary | ICD-10-CM | POA: Diagnosis not present

## 2016-05-16 DIAGNOSIS — S0181XA Laceration without foreign body of other part of head, initial encounter: Secondary | ICD-10-CM | POA: Diagnosis not present

## 2016-05-16 DIAGNOSIS — E11649 Type 2 diabetes mellitus with hypoglycemia without coma: Secondary | ICD-10-CM | POA: Diagnosis not present

## 2016-05-16 DIAGNOSIS — S098XXA Other specified injuries of head, initial encounter: Secondary | ICD-10-CM | POA: Diagnosis not present

## 2016-05-16 DIAGNOSIS — I5032 Chronic diastolic (congestive) heart failure: Secondary | ICD-10-CM | POA: Diagnosis not present

## 2016-05-16 DIAGNOSIS — I5043 Acute on chronic combined systolic (congestive) and diastolic (congestive) heart failure: Secondary | ICD-10-CM | POA: Diagnosis not present

## 2016-05-16 DIAGNOSIS — S0180XA Unspecified open wound of other part of head, initial encounter: Secondary | ICD-10-CM | POA: Diagnosis not present

## 2016-05-16 DIAGNOSIS — Y92129 Unspecified place in nursing home as the place of occurrence of the external cause: Secondary | ICD-10-CM | POA: Diagnosis not present

## 2016-05-16 DIAGNOSIS — M1990 Unspecified osteoarthritis, unspecified site: Secondary | ICD-10-CM | POA: Diagnosis not present

## 2016-05-16 DIAGNOSIS — Y939 Activity, unspecified: Secondary | ICD-10-CM | POA: Diagnosis not present

## 2016-05-16 DIAGNOSIS — E162 Hypoglycemia, unspecified: Secondary | ICD-10-CM | POA: Diagnosis not present

## 2016-05-16 DIAGNOSIS — I2510 Atherosclerotic heart disease of native coronary artery without angina pectoris: Secondary | ICD-10-CM | POA: Diagnosis not present

## 2016-05-16 DIAGNOSIS — I5023 Acute on chronic systolic (congestive) heart failure: Secondary | ICD-10-CM | POA: Diagnosis not present

## 2016-05-16 DIAGNOSIS — R531 Weakness: Secondary | ICD-10-CM | POA: Diagnosis not present

## 2016-05-16 DIAGNOSIS — R2681 Unsteadiness on feet: Secondary | ICD-10-CM | POA: Diagnosis not present

## 2016-05-16 DIAGNOSIS — Z794 Long term (current) use of insulin: Secondary | ICD-10-CM | POA: Diagnosis not present

## 2016-05-16 DIAGNOSIS — F329 Major depressive disorder, single episode, unspecified: Secondary | ICD-10-CM | POA: Diagnosis not present

## 2016-05-16 DIAGNOSIS — S0083XA Contusion of other part of head, initial encounter: Secondary | ICD-10-CM | POA: Diagnosis not present

## 2016-05-16 DIAGNOSIS — R0602 Shortness of breath: Secondary | ICD-10-CM | POA: Diagnosis not present

## 2016-05-16 DIAGNOSIS — E1121 Type 2 diabetes mellitus with diabetic nephropathy: Secondary | ICD-10-CM | POA: Diagnosis not present

## 2016-05-16 DIAGNOSIS — I130 Hypertensive heart and chronic kidney disease with heart failure and stage 1 through stage 4 chronic kidney disease, or unspecified chronic kidney disease: Secondary | ICD-10-CM | POA: Diagnosis not present

## 2016-05-16 DIAGNOSIS — J441 Chronic obstructive pulmonary disease with (acute) exacerbation: Secondary | ICD-10-CM | POA: Diagnosis not present

## 2016-05-16 DIAGNOSIS — R5381 Other malaise: Secondary | ICD-10-CM | POA: Diagnosis not present

## 2016-05-16 DIAGNOSIS — J181 Lobar pneumonia, unspecified organism: Secondary | ICD-10-CM | POA: Diagnosis not present

## 2016-05-16 DIAGNOSIS — I509 Heart failure, unspecified: Secondary | ICD-10-CM | POA: Diagnosis not present

## 2016-05-16 LAB — CBC
HEMATOCRIT: 27.6 % — AB (ref 39.0–52.0)
Hemoglobin: 8.9 g/dL — ABNORMAL LOW (ref 13.0–17.0)
MCH: 27.8 pg (ref 26.0–34.0)
MCHC: 32.2 g/dL (ref 30.0–36.0)
MCV: 86.3 fL (ref 78.0–100.0)
PLATELETS: 286 10*3/uL (ref 150–400)
RBC: 3.2 MIL/uL — AB (ref 4.22–5.81)
RDW: 15.2 % (ref 11.5–15.5)
WBC: 7 10*3/uL (ref 4.0–10.5)

## 2016-05-16 LAB — BASIC METABOLIC PANEL
ANION GAP: 11 (ref 5–15)
BUN: 63 mg/dL — ABNORMAL HIGH (ref 6–20)
CHLORIDE: 92 mmol/L — AB (ref 101–111)
CO2: 29 mmol/L (ref 22–32)
Calcium: 8.9 mg/dL (ref 8.9–10.3)
Creatinine, Ser: 2.33 mg/dL — ABNORMAL HIGH (ref 0.61–1.24)
GFR, EST AFRICAN AMERICAN: 27 mL/min — AB (ref >60)
GFR, EST NON AFRICAN AMERICAN: 24 mL/min — AB (ref >60)
Glucose, Bld: 78 mg/dL (ref 65–99)
POTASSIUM: 4.2 mmol/L (ref 3.5–5.1)
SODIUM: 132 mmol/L — AB (ref 135–145)

## 2016-05-16 LAB — GLUCOSE, CAPILLARY
GLUCOSE-CAPILLARY: 160 mg/dL — AB (ref 65–99)
GLUCOSE-CAPILLARY: 160 mg/dL — AB (ref 65–99)

## 2016-05-16 MED ORDER — INSULIN ASPART 100 UNIT/ML ~~LOC~~ SOLN: 0.0000 [IU] | Freq: Three times a day (TID) | SUBCUTANEOUS | 11 refills | Status: DC

## 2016-05-16 MED ORDER — FUROSEMIDE 80 MG PO TABS: 80.0000 mg | ORAL_TABLET | Freq: Every day | ORAL | 0 refills | Status: DC

## 2016-05-16 MED ORDER — INSULIN ASPART 100 UNIT/ML ~~LOC~~ SOLN: 0.0000 [IU] | Freq: Every day | SUBCUTANEOUS | 11 refills | Status: DC

## 2016-05-16 MED ORDER — ADULT MULTIVITAMIN W/MINERALS CH: 1.0000 | ORAL_TABLET | Freq: Two times a day (BID) | ORAL | 0 refills | Status: DC

## 2016-05-16 MED ORDER — INSULIN GLARGINE 100 UNIT/ML ~~LOC~~ SOLN: 40.0000 [IU] | SUBCUTANEOUS | 11 refills | Status: DC

## 2016-05-16 NOTE — Telephone Encounter (Signed)
Left voicemail message to call back  

## 2016-05-16 NOTE — Progress Notes (Signed)
Patient [name] Date of Encounter: 05/16/2016  Primary Cardiologist: Dr [facility] Problem List     Active Problems:   Pneumonia   Shortness of breath   Anemia of chronic disease   Acute on chronic congestive heart failure (HCC)   Muscle weakness (generalized)   COPD exacerbation (HCC)    Subjective   Feeling well.  Breathing and edema have improved.   Inpatient Medications    Scheduled Meds: . aspirin EC  81 mg Oral Daily  . carvedilol  6.25 mg Oral BID  . cholecalciferol  1,000 Units Oral Daily  . escitalopram  10 mg Oral Daily  . ferrous sulfate  325 mg Oral Q breakfast  . furosemide  80 mg Oral Daily  . hydroxychloroquine  200 mg Oral Daily  . insulin aspart  0-9 Units Subcutaneous TID WC  . insulin aspart  0-9 Units Subcutaneous QHS  . insulin glargine  40 Units Subcutaneous BH-q7a  . isosorbide mononitrate  30 mg Oral Daily  . multivitamin  1 tablet Oral Daily  . multivitamin with minerals  1 tablet Oral BID  . pantoprazole  40 mg Oral BID  . polyethylene glycol  17 g Oral Daily  . tamsulosin  0.4 mg Oral QPC supper  . tiotropium  18 mcg Inhalation Daily  . vitamin C  500 mg Oral Daily   Continuous Infusions:  PRN Meds: acetaminophen, guaiFENesin-dextromethorphan, ipratropium-albuterol, technetium TC 18M diethylenetriame-pentaacetic acid   Vital Signs    Vitals:   05/15/16 0747 05/15/16 1421 05/15/16 1948 05/16/16 0439  BP:   (!) 126/57 (!) 124/49  Pulse:   60 (!) 56  Resp:   (!) 25   Temp:  98.8 F (37.1 C) 97.8 F (36.6 C) 98.1 F (36.7 C)  TempSrc:  Oral Oral Oral  SpO2: 99% 98% 100% 97%  Weight:    104.3 kg (230 lb)  Height:        Intake/Output Summary (Last 24 hours) at 05/16/16 1031 Last data filed at 05/16/16 0455  Gross per 24 hour  Intake              802 ml  Output             1650 ml  Net             -848 ml   Filed Weights   05/14/16 0550 05/15/16 0425 05/16/16 0439  Weight: 103 kg (227 lb) 102.6 kg  (226 lb 1.6 oz) 104.3 kg (230 lb)    Physical Exam    GEN: Well nourished, well developed, in no acute distress.  HEENT: Grossly normal.  Neck: Supple, minimal JVD, no carotid bruits, or masses. Cardiac: Irreg R&R, 2/6 murmurs, rubs, or gallops. No clubbing, cyanosis.  1+ pitting edema to lower tibia bilaterally.  Radials/DP/PT 2+ and equal bilaterally.  Respiratory:  Respirations regular and unlabored, decreased BS bases bilaterally w/ few rales GI: Soft, nontender, nondistended, BS + x 4. MS: no deformity or atrophy. Skin: warm and dry, no rash. Neuro:  Strength and sensation are intact. Psych: AAOx3.  Normal affect.  Labs    CBC  Recent Labs  05/15/16 0433 05/16/16 0329  WBC 6.6 7.0  HGB 8.9* 8.9*  HCT 28.0* 27.6*  MCV 85.4 86.3  PLT 324 [id number]   Basic Metabolic Panel  Recent Labs  05/15/16 0433 05/16/16 0329  NA 132* 132*  K 4.3 4.2  CL 90* 92*  CO2 29 29  GLUCOSE  288* 78  BUN 66* 63*  CREATININE 2.48* 2.33*  CALCIUM 8.8* 8.9   Fasting Lipid Panel  Recent Labs  05/14/16 0420  CHOL 100  HDL 48  LDLCALC 47  TRIG 25  CHOLHDL 2.1   Telemetry    Atrial fib, rate generally ok - Personally Reviewed  ECG    n/a - Personally Reviewed  Radiology    No results found.  Cardiac Studies   Echo 03/22/16: - Left ventricle: The cavity size was normal. There was mild concentric hypertrophy. Systolic function was normal. The estimated ejection fraction was in the range of 55% to 60%. Wall motion was normal; there were no regional wall motion abnormalities. The study is not technically sufficient to allow evaluation of LV diastolic function. - Aortic valve: A bioprosthesis was present. Transvalvular velocity was within the normal range for prosthetic valve Peak velocity (S): 241 cm/s. Mean gradient (S): 14 mm Hg. Peak gradient (S): 23 mm Hg. - Left atrium: The atrium was mild to moderately dilated. - Right ventricle: Systolic function was  normal. - Pulmonary arteries: Systolic pressure could not be accurately estimated. - Inferior vena cava: The vessel was dilated. The respirophasic diameter changes were blunted (<50%), consistent with elevated central venous pressure.  Patient Profile     [name] is an 82 year old man with CAD, permanent atrial fibrillation, hypertension, hyperlipidemia, prior stroke, status post bioprosthetic AVR, CKD 3, and prior GI bleed requiring transfusion here with community-acquired pneumonia and acute on chronic diastolic heart failure.   Assessment & Plan    # Acute on chronic diastolic heart failure:[name] has improved with diuresis.  He diuresed 700 mL yesterday and his renal function has improved slightly. He seems to be tolerating oral lasix well.  He will follow up in Dr. Donivan Scull office on 12/13.  Continue carvedilol and Imdur.  # s/p bioprosthetic [id number]. Mean gradient 14 mmHg 03/2016.   # Permanent atrial fibrillation: Rates are well-controlled. He is not on anticoagulation due to GI bleeding and has required transfusion this admission. Continue aspirin and Coreg.  # CKD 3: Baseline creatinineis around 2.1. He had acute on chroni renal failure this admission with a peak creatinine of 2.63. It is down to 2.3 today.   # Hypertension:Blood pressure is well-controlled. Continue carvedilol and Imdur.   # CAD:  # s/p CVA: Not active issues. No ischemic symptoms.Continue aspirin, carvedilol, and Imdur. He is not on a statin. This is presumably due to age. To be addressed by his primary cardiologist in the outpatient setting if deemed indicated. Fasting lipids are above.     Signed, Deedee Lybarger C. Oval Linsey, MD, [facility]  05/16/2016  10:31 AM

## 2016-05-16 NOTE — Clinical Social Work Note (Signed)
Clinical Social Worker facilitated patient discharge including contacting patient family and facility to confirm patient discharge plans.  Clinical information faxed to facility and family agreeable with plan.  CSW arranged transport with family to [facility].  RN to call report prior to discharge.  Clinical Social Worker will sign off for now as social work intervention is no longer needed. Please consult [place] again if new need arises.  Barbette Or, [place]

## 2016-05-17 ENCOUNTER — Ambulatory Visit: Payer: Medicare Other | Admitting: Family

## 2016-05-17 ENCOUNTER — Non-Acute Institutional Stay (SKILLED_NURSING_FACILITY): Payer: Medicare Other | Admitting: Family

## 2016-05-17 DIAGNOSIS — H1031 Unspecified acute conjunctivitis, right eye: Secondary | ICD-10-CM | POA: Diagnosis not present

## 2016-05-17 LAB — GLUCOSE, CAPILLARY
GLUCOSE-CAPILLARY: 87 mg/dL (ref 65–99)
GLUCOSE-CAPILLARY: 147 mg/dL — AB (ref 65–99)
Glucose-Capillary: 342 mg/dL — ABNORMAL HIGH (ref 65–99)

## 2016-05-17 MED ORDER — GENTAMICIN SULFATE 0.3 % OP SOLN: 1.0000 [drp] | Freq: Three times a day (TID) | OPHTHALMIC | 0 refills | Status: AC
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
## 2016-05-20 ENCOUNTER — Non-Acute Institutional Stay (SKILLED_NURSING_FACILITY): Payer: Medicare Other | Admitting: Internal Medicine

## 2016-05-20 ENCOUNTER — Encounter: Payer: Self-pay | Admitting: Internal Medicine

## 2016-05-20 DIAGNOSIS — D638 Anemia in other chronic diseases classified elsewhere: Secondary | ICD-10-CM

## 2016-05-20 DIAGNOSIS — J441 Chronic obstructive pulmonary disease with (acute) exacerbation: Secondary | ICD-10-CM

## 2016-05-20 DIAGNOSIS — N400 Enlarged prostate without lower urinary tract symptoms: Secondary | ICD-10-CM

## 2016-05-20 DIAGNOSIS — I480 Paroxysmal atrial fibrillation: Secondary | ICD-10-CM

## 2016-05-20 DIAGNOSIS — M069 Rheumatoid arthritis, unspecified: Secondary | ICD-10-CM

## 2016-05-20 DIAGNOSIS — R5381 Other malaise: Secondary | ICD-10-CM | POA: Diagnosis not present

## 2016-05-20 DIAGNOSIS — J181 Lobar pneumonia, unspecified organism: Secondary | ICD-10-CM | POA: Diagnosis not present

## 2016-05-20 DIAGNOSIS — H109 Unspecified conjunctivitis: Secondary | ICD-10-CM | POA: Diagnosis not present

## 2016-05-20 DIAGNOSIS — F32A Depression, unspecified: Secondary | ICD-10-CM

## 2016-05-20 DIAGNOSIS — I2510 Atherosclerotic heart disease of native coronary artery without angina pectoris: Secondary | ICD-10-CM | POA: Diagnosis not present

## 2016-05-20 DIAGNOSIS — E871 Hypo-osmolality and hyponatremia: Secondary | ICD-10-CM | POA: Diagnosis not present

## 2016-05-20 DIAGNOSIS — E1121 Type 2 diabetes mellitus with diabetic nephropathy: Secondary | ICD-10-CM

## 2016-05-20 DIAGNOSIS — Z794 Long term (current) use of insulin: Secondary | ICD-10-CM

## 2016-05-20 DIAGNOSIS — Z8719 Personal history of other diseases of the digestive system: Secondary | ICD-10-CM

## 2016-05-20 DIAGNOSIS — I5032 Chronic diastolic (congestive) heart failure: Secondary | ICD-10-CM | POA: Diagnosis not present

## 2016-05-20 DIAGNOSIS — J189 Pneumonia, unspecified organism: Secondary | ICD-10-CM

## 2016-05-20 DIAGNOSIS — R6889 Other general symptoms and signs: Secondary | ICD-10-CM

## 2016-05-20 DIAGNOSIS — N183 Chronic kidney disease, stage 3 unspecified: Secondary | ICD-10-CM

## 2016-05-20 DIAGNOSIS — F329 Major depressive disorder, single episode, unspecified: Secondary | ICD-10-CM
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
## 2016-05-22 ENCOUNTER — Ambulatory Visit: Payer: Medicare Other | Admitting: Physician Assistant
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
## 2016-05-24 ENCOUNTER — Telehealth: Payer: Self-pay | Admitting: Internal Medicine

## 2016-05-24 ENCOUNTER — Encounter: Payer: Self-pay | Admitting: *Deleted

## 2016-05-24 ENCOUNTER — Non-Acute Institutional Stay (SKILLED_NURSING_FACILITY): Payer: Medicare Other | Admitting: Family

## 2016-05-24 DIAGNOSIS — E162 Hypoglycemia, unspecified: Secondary | ICD-10-CM

## 2016-05-24 NOTE — Telephone Encounter (Signed)
Pt caregiver came in to drop off form for pt to get some help. It's in the folder up front. Thank you!

## 2016-05-26 NOTE — Telephone Encounter (Signed)
Veteran's assistance form completed  ,  charge is $50 . Please complete the administrative parts
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
## 2016-05-27 ENCOUNTER — Telehealth: Payer: Self-pay | Admitting: *Deleted

## 2016-05-27 ENCOUNTER — Inpatient Hospital Stay: Payer: Medicare Other

## 2016-05-27 ENCOUNTER — Inpatient Hospital Stay: Payer: Medicare Other | Admitting: Hematology and Oncology

## 2016-05-27 DIAGNOSIS — Z7689 Persons encountering health services in other specified circumstances: Secondary | ICD-10-CM

## 2016-05-27 NOTE — Telephone Encounter (Signed)
Patient did not come for appointment.  Checked with scheduling who states a family member contacted them to let us know patient is in [place] @ [facility] and will reschedule when patient is discharged.  Dr. Mike Gip is  concerned with whether or not he is having his labs drawn for his anemia.  Spoke with Dorothyann Gibbs, RN who states there have not been any labs drawn but will draw a CBC with diff, Ferritin and Iron/TBIC.  Asked her to fax results to [place].  States the labs will be drawn in the morning.
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
## 2016-05-28 NOTE — Telephone Encounter (Signed)
From completed , copies made for chart and billing , original placed at front desk, patient notified form ready for pick up.

## 2016-05-29 ENCOUNTER — Emergency Department (HOSPITAL_COMMUNITY)
Admission: EM | Admit: 2016-05-29 | Discharge: 2016-05-29 | Disposition: A | Discharge status: Home / Self Care | Payer: Medicare Other | Attending: Emergency Medicine | Admitting: Emergency Medicine

## 2016-05-29 ENCOUNTER — Encounter (HOSPITAL_COMMUNITY): Payer: Self-pay | Admitting: Emergency Medicine

## 2016-05-29 ENCOUNTER — Non-Acute Institutional Stay (SKILLED_NURSING_FACILITY): Payer: Medicare Other | Admitting: Family

## 2016-05-29 ENCOUNTER — Emergency Department (HOSPITAL_COMMUNITY): Payer: Medicare Other

## 2016-05-29 DIAGNOSIS — S0181XA Laceration without foreign body of other part of head, initial encounter: Secondary | ICD-10-CM | POA: Diagnosis not present

## 2016-05-29 DIAGNOSIS — Y92129 Unspecified place in nursing home as the place of occurrence of the external cause: Secondary | ICD-10-CM

## 2016-05-29 DIAGNOSIS — J449 Chronic obstructive pulmonary disease, unspecified: Secondary | ICD-10-CM | POA: Insufficient documentation

## 2016-05-29 DIAGNOSIS — IMO0002 Reserved for concepts with insufficient information to code with codable children: Secondary | ICD-10-CM

## 2016-05-29 DIAGNOSIS — E162 Hypoglycemia, unspecified: Secondary | ICD-10-CM

## 2016-05-29 DIAGNOSIS — Z7982 Long term (current) use of aspirin: Secondary | ICD-10-CM | POA: Insufficient documentation

## 2016-05-29 DIAGNOSIS — Z8673 Personal history of transient ischemic attack (TIA), and cerebral infarction without residual deficits: Secondary | ICD-10-CM | POA: Insufficient documentation

## 2016-05-29 DIAGNOSIS — S199XXA Unspecified injury of neck, initial encounter: Secondary | ICD-10-CM | POA: Diagnosis not present

## 2016-05-29 DIAGNOSIS — S0990XA Unspecified injury of head, initial encounter: Secondary | ICD-10-CM | POA: Diagnosis not present

## 2016-05-29 DIAGNOSIS — E1165 Type 2 diabetes mellitus with hyperglycemia: Secondary | ICD-10-CM | POA: Diagnosis not present

## 2016-05-29 DIAGNOSIS — H1011 Acute atopic conjunctivitis, right eye: Secondary | ICD-10-CM

## 2016-05-29 DIAGNOSIS — S0083XA Contusion of other part of head, initial encounter: Secondary | ICD-10-CM | POA: Insufficient documentation

## 2016-05-29 DIAGNOSIS — E1121 Type 2 diabetes mellitus with diabetic nephropathy: Secondary | ICD-10-CM | POA: Diagnosis not present

## 2016-05-29 DIAGNOSIS — Y999 Unspecified external cause status: Secondary | ICD-10-CM | POA: Insufficient documentation

## 2016-05-29 DIAGNOSIS — Z794 Long term (current) use of insulin: Secondary | ICD-10-CM | POA: Insufficient documentation

## 2016-05-29 DIAGNOSIS — Z7901 Long term (current) use of anticoagulants: Secondary | ICD-10-CM | POA: Insufficient documentation

## 2016-05-29 DIAGNOSIS — E11649 Type 2 diabetes mellitus with hypoglycemia without coma: Secondary | ICD-10-CM | POA: Diagnosis not present

## 2016-05-29 DIAGNOSIS — N183 Chronic kidney disease, stage 3 (moderate): Secondary | ICD-10-CM | POA: Insufficient documentation

## 2016-05-29 DIAGNOSIS — I130 Hypertensive heart and chronic kidney disease with heart failure and stage 1 through stage 4 chronic kidney disease, or unspecified chronic kidney disease: Secondary | ICD-10-CM | POA: Insufficient documentation

## 2016-05-29 DIAGNOSIS — I2510 Atherosclerotic heart disease of native coronary artery without angina pectoris: Secondary | ICD-10-CM | POA: Insufficient documentation

## 2016-05-29 DIAGNOSIS — W19XXXA Unspecified fall, initial encounter: Secondary | ICD-10-CM | POA: Insufficient documentation

## 2016-05-29 DIAGNOSIS — Z951 Presence of aortocoronary bypass graft: Secondary | ICD-10-CM | POA: Insufficient documentation

## 2016-05-29 DIAGNOSIS — Z87891 Personal history of nicotine dependence: Secondary | ICD-10-CM | POA: Insufficient documentation

## 2016-05-29 DIAGNOSIS — Y939 Activity, unspecified: Secondary | ICD-10-CM | POA: Insufficient documentation

## 2016-05-29 DIAGNOSIS — E1140 Type 2 diabetes mellitus with diabetic neuropathy, unspecified: Secondary | ICD-10-CM | POA: Insufficient documentation

## 2016-05-29 DIAGNOSIS — W19XXXS Unspecified fall, sequela: Secondary | ICD-10-CM | POA: Diagnosis not present

## 2016-05-29 DIAGNOSIS — I5043 Acute on chronic combined systolic (congestive) and diastolic (congestive) heart failure: Secondary | ICD-10-CM | POA: Insufficient documentation

## 2016-05-29 DIAGNOSIS — Y929 Unspecified place or not applicable: Secondary | ICD-10-CM | POA: Insufficient documentation

## 2016-05-29 DIAGNOSIS — R531 Weakness: Secondary | ICD-10-CM | POA: Diagnosis not present

## 2016-05-29 LAB — CBG MONITORING, ED
GLUCOSE-CAPILLARY: 153 mg/dL — AB (ref 65–99)
Glucose-Capillary: 110 mg/dL — ABNORMAL HIGH (ref 65–99)

## 2016-05-29 MED ORDER — LIDOCAINE-EPINEPHRINE-TETRACAINE (LET) SOLUTION
3.0000 mL | Freq: Once | NASAL | Status: AC
  Administered 2016-05-29: 3 mL via TOPICAL
  Filled 2016-05-29: qty 3
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
## 2016-05-29 NOTE — ED Triage Notes (Signed)
Pt from [facility] via [place]. Pt was found on floor face down by staff. Pt was slurring words and acting altered. Per EMS pt CBG was 53 on 1st check. After oral gel & D10 IV, pt CBG went up to 128. Pt A&O x4 on arrival. Denies any pain. Sts he does not remember how he fell. Admits to LOC. Sts he was not given enough to eat earlier today and that was what cause his CBG drop.

## 2016-05-31 ENCOUNTER — Non-Acute Institutional Stay (SKILLED_NURSING_FACILITY): Payer: Medicare Other | Admitting: Family

## 2016-05-31 DIAGNOSIS — E1100 Type 2 diabetes mellitus with hyperosmolarity without nonketotic hyperglycemic-hyperosmolar coma (NKHHC): Secondary | ICD-10-CM

## 2016-05-31 DIAGNOSIS — N183 Chronic kidney disease, stage 3 unspecified: Secondary | ICD-10-CM

## 2016-05-31 DIAGNOSIS — I482 Chronic atrial fibrillation, unspecified: Secondary | ICD-10-CM

## 2016-05-31 DIAGNOSIS — I2510 Atherosclerotic heart disease of native coronary artery without angina pectoris: Secondary | ICD-10-CM

## 2016-05-31 DIAGNOSIS — I5022 Chronic systolic (congestive) heart failure: Secondary | ICD-10-CM

## 2016-05-31 DIAGNOSIS — I110 Hypertensive heart disease with heart failure: Secondary | ICD-10-CM | POA: Diagnosis not present

## 2016-05-31 DIAGNOSIS — R2681 Unsteadiness on feet: Secondary | ICD-10-CM

## 2016-05-31 DIAGNOSIS — I5032 Chronic diastolic (congestive) heart failure: Secondary | ICD-10-CM

## 2016-05-31 DIAGNOSIS — M1990 Unspecified osteoarthritis, unspecified site: Secondary | ICD-10-CM

## 2016-05-31 DIAGNOSIS — M6281 Muscle weakness (generalized): Secondary | ICD-10-CM | POA: Diagnosis not present

## 2016-05-31 DIAGNOSIS — D631 Anemia in chronic kidney disease: Secondary | ICD-10-CM

## 2016-05-31 DIAGNOSIS — Z794 Long term (current) use of insulin: Secondary | ICD-10-CM

## 2016-05-31 DIAGNOSIS — J449 Chronic obstructive pulmonary disease, unspecified: Secondary | ICD-10-CM
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
## 2016-06-01 ENCOUNTER — Encounter (HOSPITAL_COMMUNITY): Payer: Self-pay | Admitting: Emergency Medicine

## 2016-06-01 ENCOUNTER — Emergency Department (HOSPITAL_COMMUNITY): Payer: Medicare Other

## 2016-06-01 ENCOUNTER — Inpatient Hospital Stay (HOSPITAL_COMMUNITY)
Admission: EM | Admit: 2016-06-01 | Discharge: 2016-06-04 | DRG: 292 | Disposition: A | Discharge status: Skilled Nursing Facility | Payer: Medicare Other | Attending: Internal Medicine | Admitting: Internal Medicine

## 2016-06-01 DIAGNOSIS — W19XXXA Unspecified fall, initial encounter: Secondary | ICD-10-CM | POA: Diagnosis present

## 2016-06-01 DIAGNOSIS — M25562 Pain in left knee: Secondary | ICD-10-CM | POA: Diagnosis not present

## 2016-06-01 DIAGNOSIS — E1142 Type 2 diabetes mellitus with diabetic polyneuropathy: Secondary | ICD-10-CM | POA: Diagnosis not present

## 2016-06-01 DIAGNOSIS — I482 Chronic atrial fibrillation: Secondary | ICD-10-CM | POA: Diagnosis present

## 2016-06-01 DIAGNOSIS — Z833 Family history of diabetes mellitus: Secondary | ICD-10-CM

## 2016-06-01 DIAGNOSIS — Z8551 Personal history of malignant neoplasm of bladder: Secondary | ICD-10-CM

## 2016-06-01 DIAGNOSIS — E1121 Type 2 diabetes mellitus with diabetic nephropathy: Secondary | ICD-10-CM | POA: Diagnosis not present

## 2016-06-01 DIAGNOSIS — I5032 Chronic diastolic (congestive) heart failure: Secondary | ICD-10-CM | POA: Diagnosis present

## 2016-06-01 DIAGNOSIS — Z87891 Personal history of nicotine dependence: Secondary | ICD-10-CM

## 2016-06-01 DIAGNOSIS — K922 Gastrointestinal hemorrhage, unspecified: Secondary | ICD-10-CM | POA: Diagnosis present

## 2016-06-01 DIAGNOSIS — E11649 Type 2 diabetes mellitus with hypoglycemia without coma: Secondary | ICD-10-CM | POA: Diagnosis not present

## 2016-06-01 DIAGNOSIS — I5033 Acute on chronic diastolic (congestive) heart failure: Secondary | ICD-10-CM | POA: Diagnosis not present

## 2016-06-01 DIAGNOSIS — I130 Hypertensive heart and chronic kidney disease with heart failure and stage 1 through stage 4 chronic kidney disease, or unspecified chronic kidney disease: Secondary | ICD-10-CM | POA: Diagnosis present

## 2016-06-01 DIAGNOSIS — E1122 Type 2 diabetes mellitus with diabetic chronic kidney disease: Secondary | ICD-10-CM | POA: Diagnosis present

## 2016-06-01 DIAGNOSIS — M25512 Pain in left shoulder: Secondary | ICD-10-CM | POA: Diagnosis present

## 2016-06-01 DIAGNOSIS — R531 Weakness: Secondary | ICD-10-CM

## 2016-06-01 DIAGNOSIS — Z953 Presence of xenogenic heart valve: Secondary | ICD-10-CM

## 2016-06-01 DIAGNOSIS — Z794 Long term (current) use of insulin: Secondary | ICD-10-CM

## 2016-06-01 DIAGNOSIS — I69354 Hemiplegia and hemiparesis following cerebral infarction affecting left non-dominant side: Secondary | ICD-10-CM

## 2016-06-01 DIAGNOSIS — Z951 Presence of aortocoronary bypass graft: Secondary | ICD-10-CM

## 2016-06-01 DIAGNOSIS — R55 Syncope and collapse: Secondary | ICD-10-CM | POA: Diagnosis not present

## 2016-06-01 DIAGNOSIS — D509 Iron deficiency anemia, unspecified: Secondary | ICD-10-CM | POA: Diagnosis present

## 2016-06-01 DIAGNOSIS — N189 Chronic kidney disease, unspecified: Secondary | ICD-10-CM | POA: Diagnosis present

## 2016-06-01 DIAGNOSIS — N183 Chronic kidney disease, stage 3 unspecified: Secondary | ICD-10-CM | POA: Diagnosis present

## 2016-06-01 DIAGNOSIS — D631 Anemia in chronic kidney disease: Secondary | ICD-10-CM | POA: Diagnosis present

## 2016-06-01 DIAGNOSIS — I481 Persistent atrial fibrillation: Secondary | ICD-10-CM | POA: Diagnosis not present

## 2016-06-01 DIAGNOSIS — I4819 Other persistent atrial fibrillation: Secondary | ICD-10-CM | POA: Diagnosis present

## 2016-06-01 DIAGNOSIS — R296 Repeated falls: Secondary | ICD-10-CM

## 2016-06-01 DIAGNOSIS — I10 Essential (primary) hypertension: Secondary | ICD-10-CM | POA: Diagnosis not present

## 2016-06-01 DIAGNOSIS — I2510 Atherosclerotic heart disease of native coronary artery without angina pectoris: Secondary | ICD-10-CM | POA: Diagnosis present

## 2016-06-01 DIAGNOSIS — K31819 Angiodysplasia of stomach and duodenum without bleeding: Secondary | ICD-10-CM | POA: Diagnosis present

## 2016-06-01 DIAGNOSIS — H3530 Unspecified macular degeneration: Secondary | ICD-10-CM | POA: Diagnosis present

## 2016-06-01 DIAGNOSIS — K219 Gastro-esophageal reflux disease without esophagitis: Secondary | ICD-10-CM | POA: Diagnosis present

## 2016-06-01 DIAGNOSIS — S0181XA Laceration without foreign body of other part of head, initial encounter: Secondary | ICD-10-CM | POA: Diagnosis present

## 2016-06-01 DIAGNOSIS — M25561 Pain in right knee: Secondary | ICD-10-CM | POA: Diagnosis present

## 2016-06-01 DIAGNOSIS — Z7982 Long term (current) use of aspirin: Secondary | ICD-10-CM

## 2016-06-01 DIAGNOSIS — Z79899 Other long term (current) drug therapy: Secondary | ICD-10-CM

## 2016-06-01 DIAGNOSIS — S8992XA Unspecified injury of left lower leg, initial encounter: Secondary | ICD-10-CM | POA: Diagnosis not present

## 2016-06-01 DIAGNOSIS — Z823 Family history of stroke: Secondary | ICD-10-CM

## 2016-06-01 LAB — CK TOTAL AND CKMB (NOT AT ARMC)
CK, MB: 4 ng/mL (ref 0.5–5.0)
Relative Index: 3.2 — ABNORMAL HIGH (ref 0.0–2.5)
Total CK: 124 U/L (ref 49–397)

## 2016-06-01 LAB — CBC
HCT: 29.7 % — ABNORMAL LOW (ref 39.0–52.0)
Hemoglobin: 9.4 g/dL — ABNORMAL LOW (ref 13.0–17.0)
MCH: 26.9 pg (ref 26.0–34.0)
MCHC: 31.6 g/dL (ref 30.0–36.0)
MCV: 84.9 fL (ref 78.0–100.0)
PLATELETS: 253 10*3/uL (ref 150–400)
RBC: 3.5 MIL/uL — AB (ref 4.22–5.81)
RDW: 14.7 % (ref 11.5–15.5)
WBC: 7.8 10*3/uL (ref 4.0–10.5)

## 2016-06-01 LAB — COMPREHENSIVE METABOLIC PANEL
ALBUMIN: 3.4 g/dL — AB (ref 3.5–5.0)
ALT: 16 U/L — AB (ref 17–63)
AST: 31 U/L (ref 15–41)
Alkaline Phosphatase: 107 U/L (ref 38–126)
Anion gap: 9 (ref 5–15)
BUN: 41 mg/dL — ABNORMAL HIGH (ref 6–20)
CHLORIDE: 103 mmol/L (ref 101–111)
CO2: 25 mmol/L (ref 22–32)
CREATININE: 2.11 mg/dL — AB (ref 0.61–1.24)
Calcium: 9.2 mg/dL (ref 8.9–10.3)
GFR calc non Af Amer: 27 mL/min — ABNORMAL LOW (ref >60)
GFR, EST AFRICAN AMERICAN: 31 mL/min — AB (ref >60)
Glucose, Bld: 172 mg/dL — ABNORMAL HIGH (ref 65–99)
Potassium: 4.7 mmol/L (ref 3.5–5.1)
SODIUM: 137 mmol/L (ref 135–145)
Total Bilirubin: 1 mg/dL (ref 0.3–1.2)
Total Protein: 6.6 g/dL (ref 6.5–8.1)

## 2016-06-01 LAB — URINALYSIS, ROUTINE W REFLEX MICROSCOPIC
BACTERIA UA: NONE SEEN
BILIRUBIN URINE: NEGATIVE
Glucose, UA: 50 mg/dL — AB
Hgb urine dipstick: NEGATIVE
KETONES UR: NEGATIVE mg/dL
LEUKOCYTES UA: NEGATIVE
NITRITE: NEGATIVE
PROTEIN: 100 mg/dL — AB
Specific Gravity, Urine: 1.009 (ref 1.005–1.030)
Squamous Epithelial / LPF: NONE SEEN
pH: 7 (ref 5.0–8.0)

## 2016-06-01 LAB — PROTIME-INR
INR: 1.03
Prothrombin Time: 13.5 seconds (ref 11.4–15.2)

## 2016-06-01 LAB — GLUCOSE, CAPILLARY
GLUCOSE-CAPILLARY: 289 mg/dL — AB (ref 65–99)
Glucose-Capillary: 165 mg/dL — ABNORMAL HIGH (ref 65–99)

## 2016-06-01 LAB — TROPONIN I: TROPONIN I: 0.03 ng/mL — AB (ref <0.03)

## 2016-06-01 MED ORDER — ACETAMINOPHEN 325 MG PO TABS
650.0000 mg | ORAL_TABLET | Freq: Four times a day (QID) | ORAL | Status: DC | PRN
  Administered 2016-06-01 – 2016-06-03 (×4): 650 mg via ORAL
  Filled 2016-06-01 (×4): qty 2

## 2016-06-01 MED ORDER — FUROSEMIDE 80 MG PO TABS
80.0000 mg | ORAL_TABLET | Freq: Every day | ORAL | Status: DC
  Administered 2016-06-02 – 2016-06-03 (×2): 80 mg via ORAL
  Filled 2016-06-01 (×2): qty 1

## 2016-06-01 MED ORDER — VITAMIN C 500 MG PO TABS
500.0000 mg | ORAL_TABLET | Freq: Every day | ORAL | Status: DC
  Administered 2016-06-01 – 2016-06-04 (×4): 500 mg via ORAL
  Filled 2016-06-01 (×4): qty 1

## 2016-06-01 MED ORDER — OCUVITE-LUTEIN PO CAPS
1.0000 | ORAL_CAPSULE | Freq: Two times a day (BID) | ORAL | Status: DC
  Administered 2016-06-01: 1 via ORAL
  Filled 2016-06-01 (×2): qty 1

## 2016-06-01 MED ORDER — INSULIN LISPRO 100 UNIT/ML ~~LOC~~ SOLN: 3.0000 [IU] | Freq: Three times a day (TID) | SUBCUTANEOUS | Status: DC

## 2016-06-01 MED ORDER — ESCITALOPRAM OXALATE 10 MG PO TABS
10.0000 mg | ORAL_TABLET | Freq: Every day | ORAL | Status: DC
  Administered 2016-06-02 – 2016-06-04 (×3): 10 mg via ORAL
  Filled 2016-06-01 (×3): qty 1

## 2016-06-01 MED ORDER — SODIUM CHLORIDE 0.9% FLUSH
3.0000 mL | Freq: Two times a day (BID) | INTRAVENOUS | Status: DC
  Administered 2016-06-01 – 2016-06-03 (×5): 3 mL via INTRAVENOUS

## 2016-06-01 MED ORDER — ADULT MULTIVITAMIN W/MINERALS CH
1.0000 | ORAL_TABLET | Freq: Every day | ORAL | Status: DC
  Administered 2016-06-01 – 2016-06-04 (×4): 1 via ORAL
  Filled 2016-06-01 (×4): qty 1

## 2016-06-01 MED ORDER — TAMSULOSIN HCL 0.4 MG PO CAPS
0.4000 mg | ORAL_CAPSULE | Freq: Every day | ORAL | Status: DC
  Administered 2016-06-02 – 2016-06-04 (×3): 0.4 mg via ORAL
  Filled 2016-06-01 (×3): qty 1

## 2016-06-01 MED ORDER — LORATADINE 10 MG PO TABS
10.0000 mg | ORAL_TABLET | Freq: Every day | ORAL | Status: DC
  Administered 2016-06-02 – 2016-06-04 (×3): 10 mg via ORAL
  Filled 2016-06-01 (×3): qty 1

## 2016-06-01 MED ORDER — SODIUM CHLORIDE 0.9 % IV SOLN
INTRAVENOUS | Status: DC
  Administered 2016-06-01: 15:00:00 via INTRAVENOUS

## 2016-06-01 MED ORDER — SODIUM CHLORIDE 0.9 % IV SOLN: INTRAVENOUS | Status: DC

## 2016-06-01 MED ORDER — INSULIN ASPART 100 UNIT/ML ~~LOC~~ SOLN
3.0000 [IU] | Freq: Three times a day (TID) | SUBCUTANEOUS | Status: DC
  Administered 2016-06-02: 5 [IU] via SUBCUTANEOUS
  Administered 2016-06-02 – 2016-06-03 (×3): 6 [IU] via SUBCUTANEOUS
  Administered 2016-06-03: 4 [IU] via SUBCUTANEOUS
  Administered 2016-06-04 (×2): 3 [IU] via SUBCUTANEOUS

## 2016-06-01 MED ORDER — ASPIRIN EC 81 MG PO TBEC
81.0000 mg | DELAYED_RELEASE_TABLET | Freq: Every day | ORAL | Status: DC
  Administered 2016-06-02 – 2016-06-04 (×3): 81 mg via ORAL
  Filled 2016-06-01 (×3): qty 1

## 2016-06-01 MED ORDER — TIOTROPIUM BROMIDE MONOHYDRATE 18 MCG IN CAPS
18.0000 ug | ORAL_CAPSULE | Freq: Every day | RESPIRATORY_TRACT | Status: DC
  Administered 2016-06-02: 18 ug via RESPIRATORY_TRACT
  Filled 2016-06-01: qty 5

## 2016-06-01 MED ORDER — ISOSORBIDE MONONITRATE ER 30 MG PO TB24
30.0000 mg | ORAL_TABLET | Freq: Every day | ORAL | Status: DC
  Administered 2016-06-02 – 2016-06-04 (×3): 30 mg via ORAL
  Filled 2016-06-01 (×3): qty 1

## 2016-06-01 MED ORDER — HYDROXYCHLOROQUINE SULFATE 200 MG PO TABS
200.0000 mg | ORAL_TABLET | Freq: Every day | ORAL | Status: DC
  Administered 2016-06-01 – 2016-06-04 (×4): 200 mg via ORAL
  Filled 2016-06-01 (×4): qty 1

## 2016-06-01 MED ORDER — INSULIN GLARGINE 100 UNIT/ML ~~LOC~~ SOLN
45.0000 [IU] | SUBCUTANEOUS | Status: DC
  Filled 2016-06-01: qty 0.45

## 2016-06-01 MED ORDER — FERROUS SULFATE 325 (65 FE) MG PO TABS
325.0000 mg | ORAL_TABLET | Freq: Every day | ORAL | Status: DC
  Administered 2016-06-02 – 2016-06-04 (×3): 325 mg via ORAL
  Filled 2016-06-01 (×3): qty 1

## 2016-06-01 MED ORDER — PANTOPRAZOLE SODIUM 40 MG PO TBEC
40.0000 mg | DELAYED_RELEASE_TABLET | Freq: Every day | ORAL | Status: DC
  Administered 2016-06-01 – 2016-06-04 (×4): 40 mg via ORAL
  Filled 2016-06-01 (×4): qty 1

## 2016-06-01 MED ORDER — KETOTIFEN FUMARATE 0.025 % OP SOLN
2.0000 [drp] | Freq: Two times a day (BID) | OPHTHALMIC | Status: DC
  Administered 2016-06-01 – 2016-06-04 (×6): 2 [drp] via OPHTHALMIC
  Filled 2016-06-01: qty 5

## 2016-06-01 MED ORDER — CARVEDILOL 6.25 MG PO TABS
6.2500 mg | ORAL_TABLET | Freq: Two times a day (BID) | ORAL | Status: DC
  Administered 2016-06-01 – 2016-06-04 (×6): 6.25 mg via ORAL
  Filled 2016-06-01 (×6): qty 1

## 2016-06-01 MED ORDER — VITAMIN D 1000 UNITS PO TABS
1000.0000 [IU] | ORAL_TABLET | Freq: Every day | ORAL | Status: DC
  Administered 2016-06-01 – 2016-06-04 (×4): 1000 [IU] via ORAL
  Filled 2016-06-01 (×4): qty 1

## 2016-06-01 MED ORDER — INSULIN GLARGINE 100 UNIT/ML ~~LOC~~ SOLN
45.0000 [IU] | Freq: Every day | SUBCUTANEOUS | Status: DC
  Administered 2016-06-01: 45 [IU] via SUBCUTANEOUS
  Filled 2016-06-01: qty 0.45

## 2016-06-01 MED ORDER — HEPARIN SODIUM (PORCINE) 5000 UNIT/ML IJ SOLN
5000.0000 [IU] | Freq: Three times a day (TID) | INTRAMUSCULAR | Status: DC
  Administered 2016-06-01 – 2016-06-04 (×9): 5000 [IU] via SUBCUTANEOUS
  Filled 2016-06-01 (×9): qty 1
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
## 2016-06-01 NOTE — ED Notes (Signed)
Patient transported to CT 

## 2016-06-01 NOTE — ED Triage Notes (Addendum)
Pt reports recent DC from [facility] to home. Pt currently using home health. Pt reports last night around midnight he fell while trying to use the urinal. Pt has been on floor since then. EMS called by neighbor. Pt has prior left sided deficits from previous CVA. Pt has several stages of bruising to face and throughout body from several recent falls. Pt states new pain to left knee. EMS reports pt had some tenderness to left hip on assessment. Pt also reports increased swelling to left eye. Alert and Oriented. Hard of hearing.   Pt hypertensive but has not taken his meds today. Afib with history of same.

## 2016-06-01 NOTE — ED Notes (Signed)
Unable to obtain IV access X 2.  

## 2016-06-02 ENCOUNTER — Encounter (HOSPITAL_COMMUNITY): Payer: Self-pay | Admitting: Nurse Practitioner

## 2016-06-02 DIAGNOSIS — R296 Repeated falls: Secondary | ICD-10-CM

## 2016-06-02 LAB — BASIC METABOLIC PANEL
ANION GAP: 9 (ref 5–15)
BUN: 39 mg/dL — ABNORMAL HIGH (ref 6–20)
CALCIUM: 9 mg/dL (ref 8.9–10.3)
CHLORIDE: 102 mmol/L (ref 101–111)
CO2: 24 mmol/L (ref 22–32)
Creatinine, Ser: 1.95 mg/dL — ABNORMAL HIGH (ref 0.61–1.24)
GFR calc Af Amer: 34 mL/min — ABNORMAL LOW (ref >60)
GFR calc non Af Amer: 29 mL/min — ABNORMAL LOW (ref >60)
GLUCOSE: 236 mg/dL — AB (ref 65–99)
Potassium: 4.5 mmol/L (ref 3.5–5.1)
Sodium: 135 mmol/L (ref 135–145)

## 2016-06-02 LAB — CBC
HCT: 27.8 % — ABNORMAL LOW (ref 39.0–52.0)
HEMOGLOBIN: 8.8 g/dL — AB (ref 13.0–17.0)
MCH: 26.8 pg (ref 26.0–34.0)
MCHC: 31.7 g/dL (ref 30.0–36.0)
MCV: 84.8 fL (ref 78.0–100.0)
Platelets: 250 10*3/uL (ref 150–400)
RBC: 3.28 MIL/uL — ABNORMAL LOW (ref 4.22–5.81)
RDW: 14.9 % (ref 11.5–15.5)
WBC: 6.4 10*3/uL (ref 4.0–10.5)

## 2016-06-02 LAB — GLUCOSE, CAPILLARY
GLUCOSE-CAPILLARY: 207 mg/dL — AB (ref 65–99)
GLUCOSE-CAPILLARY: 247 mg/dL — AB (ref 65–99)
Glucose-Capillary: 257 mg/dL — ABNORMAL HIGH (ref 65–99)
Glucose-Capillary: 291 mg/dL — ABNORMAL HIGH (ref 65–99)

## 2016-06-02 MED ORDER — PROSIGHT PO TABS
1.0000 | ORAL_TABLET | Freq: Every day | ORAL | Status: DC
  Administered 2016-06-02 – 2016-06-04 (×3): 1 via ORAL
  Filled 2016-06-02 (×3): qty 1

## 2016-06-02 MED ORDER — INSULIN GLARGINE 100 UNIT/ML ~~LOC~~ SOLN
45.0000 [IU] | Freq: Every morning | SUBCUTANEOUS | Status: DC
  Administered 2016-06-02 – 2016-06-04 (×3): 45 [IU] via SUBCUTANEOUS
  Filled 2016-06-02 (×3): qty 0.45

## 2016-06-02 NOTE — Progress Notes (Signed)
LCSW has reviewed chart and consult for SNF regarding placement.  Patient has been at [facility]: 12/07- 05/31/16.  Patient has used approximately 14 days for short term benefit Of Medicare and would be in co-pay days after 20 days. Calls have been placed to [facility] to understand reason for discharge. Pt's son Dr. Acie Fredrickson 336 [phone] verbalized he wasn't happy with facility and wants pt placed somewhere else.   Patient has used Medicare Days with new recommendations from PT stating Supervision/OOB SNF making patient more of custodial care vs a skilled need. Patient will have to pay privately to SNF if wanting long term care as this is the recommendation.  LCSW staffed case with MD Reynaldo Minium who is in agreement as patient is more custodial vs skilled at this time.   CSW contacted attending MD who reported pt is not at baseline and is not ready to DC. CSW will continue to follow and work with family on placement.  Adreona Brand B. Joline Maxcy Clinical Social Work Dept Weekend Social Worker [phone] 1:53 PM     Lane Hacker, MSW Clinical Social Work: System Wide Float Coverage for :  [phone]

## 2016-06-02 NOTE — Evaluation (Signed)
Physical Therapy Evaluation Patient Details [name] MRN: [id number] DOB: May 02, 1928 Today's Date: 06/02/2016   History of Present Illness  82 y.o.malewith medical history significant of CAD, previous CVA with residual left side deficits, CHF, GERD, anemia who comes to the hospital after a fall. He states that last night, he was sitting at the side of his bed, and the next he knew, he was on the ground. He does not remember the exact details of this episode. He was on the ground for approximately 10-11 hours before his family found him. He had just been d/c from SNF to home.   Clinical Impression  Pt presenting with decreased strength, stability, and endurance during initial PT evaluation/treatment. Pt reports that he does live alone but acknowledges that he feels like he needs more rehabilitation somewhere before going home. Based upon the patient's current mobility level, recommending SNF for further rehabilitation following his acute stay. Pt in agreement. Will continue to follow as tolerated.     Follow Up Recommendations Supervision for mobility/OOB;SNF    Equipment Recommendations  None recommended by PT    Recommendations for Other Services       Precautions / Restrictions Precautions Precautions: Fall Restrictions Weight Bearing Restrictions: No      Mobility  Bed Mobility               General bed mobility comments: pt sitting EOB upon arrival  Transfers Overall transfer level: Needs assistance Equipment used: Rolling walker (2 wheeled) Transfers: Sit to/from Stand Sit to Stand: Min guard         General transfer comment: transfers performed from EOB and BSC. Cues for hand placement. Mild instability when initially standing.   Ambulation/Gait Ambulation/Gait assistance: Min guard Ambulation Distance (Feet): 70 Feet Assistive device: Rolling walker (2 wheeled) Gait Pattern/deviations: Step-through pattern;Decreased step length -  right;Decreased step length - left;Trunk flexed Gait velocity: decreased   General Gait Details: Pt requesting to stay in room. Using rw with cues for safety.   Stairs            Wheelchair Mobility    Modified Rankin (Stroke Patients Only)       Balance Overall balance assessment: Needs assistance Sitting-balance support: Feet supported;No upper extremity supported Sitting balance-[name]: Fair Sitting balance - Comments: tendency to lean toward Lt side   Standing balance support: Bilateral upper extremity supported Standing balance-[name]: Poor Standing balance comment: requiring use of rw for support                             Pertinent Vitals/Pain Pain Assessment: Faces Faces Pain Scale: Hurts a little bit Pain Location: neck Pain Descriptors / Indicators: Sore Pain Intervention(s): Monitored during session    Home Living Family/patient expects to be discharged to:: Skilled nursing facility Living Arrangements: Alone   Type of Home: House Home Access: Stairs to enter Entrance Stairs-Rails: Psychiatric nurse of Steps: 2   Home Equipment: Environmental consultant - 2 wheels;Cane - single point      Prior Function Level of Independence: Needs assistance      ADL's / Homemaking Assistance Needed: mod I using rw        Hand Dominance        Extremity/Trunk Assessment   Upper Extremity Assessment Upper Extremity Assessment: Generalized weakness (able to use to assist with transfers)    Lower Extremity Assessment Lower Extremity Assessment: Generalized weakness    Cervical /  Trunk Assessment Cervical / Trunk Assessment: Kyphotic  Communication   Communication: HOH  Cognition Arousal/Alertness: Awake/alert Behavior During Therapy: WFL for tasks assessed/performed Overall Cognitive Status: Within Functional Limits for tasks assessed                      General Comments      Exercises     Assessment/Plan     PT Assessment Patient needs continued PT services  PT Problem List Decreased strength;Decreased range of motion;Decreased activity tolerance;Decreased balance;Decreased mobility          PT Treatment Interventions DME instruction;Gait training;Stair training;Functional mobility training;Therapeutic activities;Therapeutic exercise;Balance training;Patient/family education    PT Goals (Current goals can be found in the Care Plan section)  Acute Rehab PT Goals Patient Stated Goal: get out of the hospital again PT Goal Formulation: With patient Time For Goal Achievement: 06/16/16 Potential to Achieve Goals: Good    Frequency Min 2X/week   Barriers to discharge        Co-evaluation               End of Session Equipment Utilized During Treatment: Gait belt Activity Tolerance: Patient tolerated treatment well Patient left: in chair;with call bell/phone within reach;with nursing/sitter in room Nurse Communication: Mobility status    Functional Assessment Tool Used: clinical judgment Functional Limitation: Mobility: Walking and moving around Mobility: Walking and Moving Around Current Status [phone]): At least 20 percent but less than 40 percent impaired, limited or restricted Mobility: Walking and Moving Around Goal Status [phone]): At least 1 percent but less than 20 percent impaired, limited or restricted    Time: 0812-0838 PT Time Calculation (min) (ACUTE ONLY): 26 min   Charges:   PT Evaluation $PT Eval Moderate Complexity: 1 Procedure PT Treatments $Gait Training: 8-22 mins   PT G Codes:   PT G-Codes **NOT FOR INPATIENT CLASS** Functional Assessment Tool Used: clinical judgment Functional Limitation: Mobility: Walking and moving around Mobility: Walking and Moving Around Current Status [id number]): At least 20 percent but less than 40 percent impaired, limited or restricted Mobility: Walking and Moving Around Goal Status [phone]): At least 1 percent but less than 20  percent impaired, limited or restricted    Cassell Clement, PT, [place] Pager [phone] Office [phone]  06/02/2016, 8:52 AM

## 2016-06-02 NOTE — Progress Notes (Addendum)
Patient ID: [name], male   DOB: 1928/01/06, 82 y.o.   MRN: [id number]    PROGRESS NOTE   [name]  [name] DOB: 15-Mar-1928 DOA: 06/01/2016  PCP: [name], MD   Brief Narrative:  [name] 82 y.o. male with medical history significant of CAD, previous CVA with residual left side deficits, CHF, GERD, anemia who presented to the hospital after an episode of fall. Pt is not sure what exactly happened but was on the ground for ~ 10 + hours. This is hies 3rd fall in the past week.   Assessment & Plan:  Falls, recurrent  - CT head negative for acute injury. - PT/OT to eval requested, SNF recommended  - Will likely require long-term placement. SW consulted for assistance   Permanent atrial fibrillation - Continue aspirin, coreg, imdur  - Cardiac monitoring   CAD - Continue imdur, aspirin, coreg   Hx CVA - Residual left sided deficits - SNF recommended per PT evaluation  - SW consulted for assistance   Chronic diastolic heart failure - Sees Dr. Rockey Situ in office  - Lasix daily per home medical regimen  - last ECHO [id number] --> Systolic function was normal. The estimated ejection fraction was in the range of 55% to 60%. Wall motion was normal; there were no regional wall motion abnormalities.  - will monitor weights while inpatient as CXR is suggestive of possible vascular congestion   DM type 2, well controlled - Ha1c 6.6 - Continue Lantus 45u qAM   Chronic normocytic anemia - Hx of chronic anemia secondary to iron deficiency, CKD, recent GI bleed due to angiodysplasia of stomach - Continue iron supplement - Hgb slightly down from 9.4 --> 8.8, no signs of active bleeding but will need to monitor CBC daily while inpatient   CKD stage 3-4 - Baseline Cr appears around 2.1-2.3  - Cr down from 2.11 --> 1.95, BMP in AM  DVT prophylaxis: Lovenox SQ Code Status: Full  Family Communication: Patient at bedside, Dr. Acie Fredrickson son  Disposition Plan: to be  determined, inpatient rehab vs SNF, I have consulted inpt rehab MD for evaluation   Consultants:   None  Procedures:   None  Antimicrobials:   None  Subjective: Pt reports feeling better but still not at his baseline. Still some soreness from the fall.   Objective: Vitals:   06/02/16 0635 06/02/16 0859 06/02/16 0917 06/02/16 1307  BP: (!) 153/54 (!) 150/58  (!) 113/44  Pulse: 64 67  76  Resp:  18  18  Temp: 98.4 F (36.9 C) 98.1 F (36.7 C)  98 F (36.7 C)  TempSrc: Oral Oral  Oral  SpO2: 99% 99% 98% 99%    Intake/Output Summary (Last 24 hours) at 06/02/16 1328 Last data filed at 06/02/16 1300  Gross per 24 hour  Intake             1440 ml  Output             1220 ml  Net              220 ml   There were no vitals filed for this visit.  Examination:  General exam: Appears calm and comfortable, bruised up around orbital area  Respiratory system: Respiratory effort normal but mild bibasilar crackles noted  Cardiovascular system: S1 & S2 heard,  No JVD, rubs, gallops or clicks. No pedal edema. Gastrointestinal system: Abdomen is nondistended, soft and nontender. No organomegaly or masses felt.  Central  nervous system: No focal neurological deficits.  Data Reviewed: I have personally reviewed following labs and imaging studies  CBC:  Recent Labs Lab 06/01/16 1512 06/02/16 0232  WBC 7.8 6.4  HGB 9.4* 8.8*  HCT 29.7* 27.8*  MCV 84.9 84.8  PLT 253 [id number]   Basic Metabolic Panel:  Recent Labs Lab 06/01/16 1500 06/02/16 0232  NA 137 135  K 4.7 4.5  CL 103 102  CO2 25 24  GLUCOSE 172* 236*  BUN 41* 39*  CREATININE 2.11* 1.95*  CALCIUM 9.2 9.0   Liver Function Tests:  Recent Labs Lab 06/01/16 1500  AST 31  ALT 16*  ALKPHOS 107  BILITOT 1.0  PROT 6.6  ALBUMIN 3.4*   Coagulation Profile:  Recent Labs Lab 06/01/16 1500  INR 1.03   Cardiac Enzymes:  Recent Labs Lab 06/01/16 1500  CKTOTAL 124  CKMB 4.0  TROPONINI 0.03*   BNP (last  3 results)  Recent Labs  07/10/15 1330  PROBNP 211.0*   CBG:  Recent Labs Lab 05/29/16 0609 06/01/16 1732 06/01/16 2320 06/02/16 0634 06/02/16 1118  GLUCAP 153* 165* 289* 207* 257*   Urine analysis:    Component Value Date/Time   COLORURINE STRAW (A) 06/01/2016 1339   APPEARANCEUR CLEAR 06/01/2016 1339   APPEARANCEUR Cloudy 03/03/2013 2303   LABSPEC 1.009 06/01/2016 1339   LABSPEC 1.012 03/03/2013 2303   PHURINE 7.0 06/01/2016 1339   GLUCOSEU 50 (A) 06/01/2016 1339   GLUCOSEU NEGATIVE 09/07/2014 1536   HGBUR NEGATIVE 06/01/2016 1339   BILIRUBINUR NEGATIVE 06/01/2016 1339   BILIRUBINUR neg 01/31/2015 1400   BILIRUBINUR Negative 03/03/2013 2303   KETONESUR NEGATIVE 06/01/2016 1339   PROTEINUR 100 (A) 06/01/2016 1339   UROBILINOGEN 0.2 01/31/2015 1400   UROBILINOGEN 0.2 09/07/2014 1536   NITRITE NEGATIVE 06/01/2016 1339   LEUKOCYTESUR NEGATIVE 06/01/2016 1339   LEUKOCYTESUR 2+ 03/03/2013 2303   Radiology Studies: Dg Chest 2 View  Result Date: 06/01/2016 CLINICAL DATA:  Fall, weakness, hypertension, knee pain EXAM: CHEST  2 VIEW COMPARISON:  05/05/2016 FINDINGS: Previous median sternotomy noted. Cardiomegaly with vascular congestion and slight diffuse interstitial prominence. Volume overload or early edema not entirely excluded. No focal pneumonia, collapse or consolidation. No large effusion or pneumothorax. Right costophrenic angle is excluded on film. Aorta is atherosclerotic. Trachea remains midline. IMPRESSION: Cardiomegaly with vascular congestion and interstitial prominence suggesting volume overload or early edema. Electronically Signed   By: Jerilynn Mages.  Shick M.D.   On: 06/01/2016 14:54   Ct Head Wo Contrast  Result Date: 06/01/2016 CLINICAL DATA:  Recurrent falls, left frontal head injury EXAM: CT HEAD WITHOUT CONTRAST TECHNIQUE: Contiguous axial images were obtained from the base of the skull through the vertex without intravenous contrast. COMPARISON:  05/29/2016  FINDINGS: Brain: Similar moderately severe brain atrophy pattern with chronic white matter microvascular changes throughout the cerebral hemispheres. Remote left frontal infarct with encephalomalacia. Cerebellar atrophy as well. No acute intracranial hemorrhage, mass lesion, new infarction, midline shift, herniation, hydrocephalus, or extra-axial fluid collection. Cisterns are patent. Vascular: No hyperdense vessel or unexpected calcification. Skull: Normal. Negative for fracture or focal lesion. Sinuses/Orbits: Minor ethmoid mucosal thickening. Other sinuses and mastoids are clear. No orbital abnormality. Other: Left anterior frontal and anterior orbital soft tissue bruising noted. IMPRESSION: Left frontal and orbital soft tissue bruising. No acute intracranial finding or interval change. Stable brain atrophy, microvascular ischemic changes, and remote infarcts. Electronically Signed   By: Jerilynn Mages.  Shick M.D.   On: 06/01/2016 14:42   Dg Knee Complete 4  Views Left  Result Date: 06/01/2016 CLINICAL DATA:  Fall, knee pain EXAM: LEFT KNEE - COMPLETE 4+ VIEW COMPARISON:  None available FINDINGS: Mild-to-moderate left knee tricompartmental osteoarthritis with joint space loss, sclerosis and bony spurring. Normal alignment. No acute osseous finding, fracture, malalignment or effusion. Peripheral atherosclerosis noted. Bones are osteopenic. IMPRESSION: Osteopenia and osteoarthritis. No acute finding by plain radiography Peripheral atherosclerosis Electronically Signed   By: Jerilynn Mages.  Shick M.D.   On: 06/01/2016 14:55      Scheduled Meds: . aspirin EC  81 mg Oral Daily  . carvedilol  6.25 mg Oral BID  . cholecalciferol  1,000 Units Oral Daily  . escitalopram  10 mg Oral Daily  . ferrous sulfate  325 mg Oral Q breakfast  . furosemide  80 mg Oral Daily  . heparin  5,000 Units Subcutaneous Q8H  . hydroxychloroquine  200 mg Oral Daily  . insulin aspart  3-6 Units Subcutaneous TID WC  . insulin glargine  45 Units  Subcutaneous q morning - 10a  . isosorbide mononitrate  30 mg Oral Daily  . ketotifen  2 drop Right Eye BID  . loratadine  10 mg Oral Daily  . multivitamin  1 tablet Oral Daily  . multivitamin with minerals  1 tablet Oral Daily  . pantoprazole  40 mg Oral Daily  . sodium chloride flush  3 mL Intravenous Q12H  . tamsulosin  0.4 mg Oral Daily  . tiotropium  18 mcg Inhalation Daily  . vitamin C  500 mg Oral Daily   Continuous Infusions:   LOS: 0 days   Time spent: 20 minutes   Faye Ramsay, MD [facility] Pager [phone] Cell [phone]  If 7PM-7AM, please contact night-coverage [email or web address] Password [email or web address] 06/02/2016, 1:28 PM

## 2016-06-02 NOTE — Progress Notes (Signed)
Patient ambulated about 157ft in the hall with this RN using a walker, ambulation well tolerated, patient now resting in his recliner in the room , call light within reach will continue to monitor

## 2016-06-02 NOTE — Care Management Note (Signed)
Case Management Note  Patient Details  [name] MRN: [id number] Date of Birth: Nov 16, 1927  Subjective/Objective:                  fall Action/Plan: Discharge planning Expected Discharge Date:                  Expected Discharge Plan:  Skilled Nursing Facility  In-House Referral:  Clinical Social Work  Discharge planning Services  CM Consult  Post Acute Care Choice:    Choice offered to:  Patient, Adult Children  DME Arranged:    DME Agency:     HH Arranged:    [place] Agency:     Status of Service:  Completed, signed off  If discussed at [employer], dates discussed:    Additional Comments: Cm received call from MD requesting I speak to family concerning options.  Cm met with family and pt gives son, Dr. Acie Fredrickson to make decisions.  CM explained CIR is NOT an option; Dr. Acie Fredrickson verbalizes understanding.  CM notes pt was inpt within last month and Dr. Acie Fredrickson is hoping SNF can be arranged and family will make more permanent arrangements whil pt is in SNF.  CM has called CSW, [phone],  to please arrange ASAP. Dellie Catholic, RN 06/02/2016, 1:08 PM

## 2016-06-03 ENCOUNTER — Observation Stay (HOSPITAL_COMMUNITY): Payer: Medicare Other

## 2016-06-03 DIAGNOSIS — Z794 Long term (current) use of insulin: Secondary | ICD-10-CM | POA: Diagnosis not present

## 2016-06-03 DIAGNOSIS — K219 Gastro-esophageal reflux disease without esophagitis: Secondary | ICD-10-CM | POA: Diagnosis present

## 2016-06-03 DIAGNOSIS — J449 Chronic obstructive pulmonary disease, unspecified: Secondary | ICD-10-CM | POA: Diagnosis not present

## 2016-06-03 DIAGNOSIS — D631 Anemia in chronic kidney disease: Secondary | ICD-10-CM | POA: Diagnosis present

## 2016-06-03 DIAGNOSIS — I482 Chronic atrial fibrillation: Secondary | ICD-10-CM | POA: Diagnosis present

## 2016-06-03 DIAGNOSIS — I5032 Chronic diastolic (congestive) heart failure: Secondary | ICD-10-CM | POA: Diagnosis not present

## 2016-06-03 DIAGNOSIS — J309 Allergic rhinitis, unspecified: Secondary | ICD-10-CM | POA: Diagnosis not present

## 2016-06-03 DIAGNOSIS — R262 Difficulty in walking, not elsewhere classified: Secondary | ICD-10-CM | POA: Diagnosis not present

## 2016-06-03 DIAGNOSIS — M25512 Pain in left shoulder: Secondary | ICD-10-CM | POA: Diagnosis not present

## 2016-06-03 DIAGNOSIS — M6281 Muscle weakness (generalized): Secondary | ICD-10-CM | POA: Diagnosis not present

## 2016-06-03 DIAGNOSIS — Z823 Family history of stroke: Secondary | ICD-10-CM | POA: Diagnosis not present

## 2016-06-03 DIAGNOSIS — E1142 Type 2 diabetes mellitus with diabetic polyneuropathy: Secondary | ICD-10-CM | POA: Diagnosis present

## 2016-06-03 DIAGNOSIS — W19XXXA Unspecified fall, initial encounter: Secondary | ICD-10-CM | POA: Diagnosis present

## 2016-06-03 DIAGNOSIS — Z951 Presence of aortocoronary bypass graft: Secondary | ICD-10-CM | POA: Diagnosis not present

## 2016-06-03 DIAGNOSIS — E871 Hypo-osmolality and hyponatremia: Secondary | ICD-10-CM | POA: Diagnosis not present

## 2016-06-03 DIAGNOSIS — R296 Repeated falls: Secondary | ICD-10-CM | POA: Diagnosis not present

## 2016-06-03 DIAGNOSIS — I69354 Hemiplegia and hemiparesis following cerebral infarction affecting left non-dominant side: Secondary | ICD-10-CM | POA: Diagnosis not present

## 2016-06-03 DIAGNOSIS — I110 Hypertensive heart disease with heart failure: Secondary | ICD-10-CM | POA: Diagnosis not present

## 2016-06-03 DIAGNOSIS — I82502 Chronic embolism and thrombosis of unspecified deep veins of left lower extremity: Secondary | ICD-10-CM | POA: Diagnosis not present

## 2016-06-03 DIAGNOSIS — R531 Weakness: Secondary | ICD-10-CM | POA: Diagnosis not present

## 2016-06-03 DIAGNOSIS — H353190 Nonexudative age-related macular degeneration, unspecified eye, stage unspecified: Secondary | ICD-10-CM | POA: Diagnosis not present

## 2016-06-03 DIAGNOSIS — M0589 Other rheumatoid arthritis with rheumatoid factor of multiple sites: Secondary | ICD-10-CM | POA: Diagnosis not present

## 2016-06-03 DIAGNOSIS — N184 Chronic kidney disease, stage 4 (severe): Secondary | ICD-10-CM | POA: Diagnosis not present

## 2016-06-03 DIAGNOSIS — I2510 Atherosclerotic heart disease of native coronary artery without angina pectoris: Secondary | ICD-10-CM | POA: Diagnosis present

## 2016-06-03 DIAGNOSIS — E785 Hyperlipidemia, unspecified: Secondary | ICD-10-CM | POA: Diagnosis not present

## 2016-06-03 DIAGNOSIS — K922 Gastrointestinal hemorrhage, unspecified: Secondary | ICD-10-CM | POA: Diagnosis present

## 2016-06-03 DIAGNOSIS — Z7982 Long term (current) use of aspirin: Secondary | ICD-10-CM | POA: Diagnosis not present

## 2016-06-03 DIAGNOSIS — I481 Persistent atrial fibrillation: Secondary | ICD-10-CM | POA: Diagnosis present

## 2016-06-03 DIAGNOSIS — A0811 Acute gastroenteropathy due to Norwalk agent: Secondary | ICD-10-CM | POA: Diagnosis not present

## 2016-06-03 DIAGNOSIS — Z953 Presence of xenogenic heart valve: Secondary | ICD-10-CM | POA: Diagnosis not present

## 2016-06-03 DIAGNOSIS — I5033 Acute on chronic diastolic (congestive) heart failure: Secondary | ICD-10-CM | POA: Diagnosis present

## 2016-06-03 DIAGNOSIS — E1121 Type 2 diabetes mellitus with diabetic nephropathy: Secondary | ICD-10-CM | POA: Diagnosis present

## 2016-06-03 DIAGNOSIS — R55 Syncope and collapse: Secondary | ICD-10-CM | POA: Diagnosis present

## 2016-06-03 DIAGNOSIS — E11649 Type 2 diabetes mellitus with hypoglycemia without coma: Secondary | ICD-10-CM | POA: Diagnosis present

## 2016-06-03 DIAGNOSIS — E1122 Type 2 diabetes mellitus with diabetic chronic kidney disease: Secondary | ICD-10-CM | POA: Diagnosis present

## 2016-06-03 DIAGNOSIS — F329 Major depressive disorder, single episode, unspecified: Secondary | ICD-10-CM | POA: Diagnosis not present

## 2016-06-03 DIAGNOSIS — N400 Enlarged prostate without lower urinary tract symptoms: Secondary | ICD-10-CM | POA: Diagnosis not present

## 2016-06-03 DIAGNOSIS — I130 Hypertensive heart and chronic kidney disease with heart failure and stage 1 through stage 4 chronic kidney disease, or unspecified chronic kidney disease: Secondary | ICD-10-CM | POA: Diagnosis present

## 2016-06-03 DIAGNOSIS — D509 Iron deficiency anemia, unspecified: Secondary | ICD-10-CM | POA: Diagnosis present

## 2016-06-03 DIAGNOSIS — K31819 Angiodysplasia of stomach and duodenum without bleeding: Secondary | ICD-10-CM | POA: Diagnosis present

## 2016-06-03 DIAGNOSIS — I69334 Monoplegia of upper limb following cerebral infarction affecting left non-dominant side: Secondary | ICD-10-CM | POA: Diagnosis not present

## 2016-06-03 DIAGNOSIS — Z87891 Personal history of nicotine dependence: Secondary | ICD-10-CM | POA: Diagnosis not present

## 2016-06-03 LAB — GLUCOSE, CAPILLARY
GLUCOSE-CAPILLARY: 165 mg/dL — AB (ref 65–99)
GLUCOSE-CAPILLARY: 62 mg/dL — AB (ref 65–99)
GLUCOSE-CAPILLARY: 66 mg/dL (ref 65–99)
GLUCOSE-CAPILLARY: 141 mg/dL — AB (ref 65–99)
GLUCOSE-CAPILLARY: 244 mg/dL — AB (ref 65–99)
Glucose-Capillary: 252 mg/dL — ABNORMAL HIGH (ref 65–99)

## 2016-06-03 MED ORDER — TRAMADOL HCL 50 MG PO TABS
50.0000 mg | ORAL_TABLET | Freq: Four times a day (QID) | ORAL | Status: DC | PRN
  Administered 2016-06-03 (×3): 50 mg via ORAL
  Filled 2016-06-03 (×3): qty 1

## 2016-06-03 MED ORDER — FUROSEMIDE 10 MG/ML IJ SOLN
40.0000 mg | Freq: Every day | INTRAMUSCULAR | Status: DC
  Administered 2016-06-04: 40 mg via INTRAVENOUS
  Filled 2016-06-03: qty 4

## 2016-06-03 NOTE — Progress Notes (Addendum)
Patient ID: [name], male   DOB: 1928-02-22, 82 y.o.   MRN: [id number]    PROGRESS NOTE   [name]  [id number] DOB: May 29, 1928 DOA: 06/01/2016  PCP: [name], MD   Brief Narrative:  [name] 82 y.o. male with medical history significant of CAD, previous CVA with residual left side deficits, CHF, GERD, anemia who presented to the hospital after an episode of fall. Pt is not sure what exactly happened but was on the ground for ~ 10 + hours. This is hies 3rd fall in the past week.   Assessment & Plan:  Falls, recurrent  - CT head negative for acute injury. - PT/OT to eval requested, SNF recommended  - Will likely require long-term placement. SW consulted for assistance, placement in progress   Permanent atrial fibrillation - Continue aspirin, coreg, imdur  - Cardiac monitoring   Left shoulder pain - CT shoulder requested - OK to use tramadol prn   CAD - Continue imdur, aspirin, coreg   Hx CVA - Residual left sided deficits - SNF recommended per PT evaluation  - SW consulted for assistance   Acute on Chronic diastolic heart failure - Sees Dr. Rockey Situ in office  - lasix changed to IV as pt more LE edema and crackles at bases  - last ECHO [id number] --> Systolic function was normal. The estimated ejection fraction was in the range of 55% to 60%. Wall motion was normal; there were no regional wall motion abnormalities.  - will monitor weights while inpatient as CXR is suggestive of possible vascular congestion  - weight on admission 234 lbs, will RN to record weight today   DM type 2, well controlled - Ha1c 6.6 - Continue Lantus 45u qAM   Chronic normocytic anemia - Hx of chronic anemia secondary to iron deficiency, CKD, recent GI bleed due to angiodysplasia of stomach - Continue iron supplement - Hgb slightly down from 9.4 --> 8.8, no signs of active bleeding but will need to monitor CBC daily while inpatient   CKD stage 3-4 - Baseline Cr  appears around 2.1-2.3  - Cr down from 2.11 --> 1.95, BMP in AM  DVT prophylaxis: Heparin SQ Code Status: Full  Family Communication: Patient at bedside, Dr. Acie Fredrickson son  Disposition Plan: to be determined, inpatient rehab vs SNF, I have consulted inpt rehab MD for evaluation   Consultants:   None  Procedures:   None  Antimicrobials:   None  Subjective: Pt reports feeling better but still not at his baseline. Still some soreness from the fall. Left shoulder pain.   Objective: Vitals:   06/02/16 1307 06/02/16 2157 06/03/16 0643 06/03/16 1431  BP: (!) 113/44 (!) 141/53 (!) 153/57 (!) 130/56  Pulse: 76 73 (!) 58 75  Resp: 18  18 18   Temp: 98 F (36.7 C) 98.6 F (37 C) 97.6 F (36.4 C) 97.8 F (36.6 C)  TempSrc: Oral Oral Oral Oral  SpO2: 99% 100% 98% 95%  Weight:   106.2 kg (234 lb 1.6 oz)     Intake/Output Summary (Last 24 hours) at 06/03/16 1539 Last data filed at 06/03/16 1430  Gross per 24 hour  Intake              720 ml  Output             2875 ml  Net            -2155 ml   Filed Weights   06/03/16 [id number]  Weight: 106.2 kg (234 lb 1.6 oz)    Examination:  General exam: Appears calm and comfortable, bruised up around orbital area  Respiratory system: Respiratory effort normal but mild bibasilar crackles noted  Cardiovascular system: S1 & S2 heard,  No JVD, rubs, gallops or clicks. LE edema L > R, +1, pedal edema Gastrointestinal system: Abdomen is nondistended, soft and nontender. No organomegaly or masses felt.  Central nervous system: No focal neurological deficits.limited ROM with left shoulder due to pain   Data Reviewed: I have personally reviewed following labs and imaging studies  CBC:  Recent Labs Lab 06/01/16 1512 06/02/16 0232  WBC 7.8 6.4  HGB 9.4* 8.8*  HCT 29.7* 27.8*  MCV 84.9 84.8  PLT 253 [id number]   Basic Metabolic Panel:  Recent Labs Lab 06/01/16 1500 06/02/16 0232  NA 137 135  K 4.7 4.5  CL 103 102  CO2 25 24  GLUCOSE 172*  236*  BUN 41* 39*  CREATININE 2.11* 1.95*  CALCIUM 9.2 9.0   Liver Function Tests:  Recent Labs Lab 06/01/16 1500  AST 31  ALT 16*  ALKPHOS 107  BILITOT 1.0  PROT 6.6  ALBUMIN 3.4*   Coagulation Profile:  Recent Labs Lab 06/01/16 1500  INR 1.03   Cardiac Enzymes:  Recent Labs Lab 06/01/16 1500  CKTOTAL 124  CKMB 4.0  TROPONINI 0.03*   BNP (last 3 results)  Recent Labs  07/10/15 1330  PROBNP 211.0*   CBG:  Recent Labs Lab 06/02/16 1118 06/02/16 1636 06/02/16 2228 06/03/16 0642 06/03/16 1109  GLUCAP 257* 291* 247* 252* 165*   Urine analysis:    Component Value Date/Time   COLORURINE STRAW (A) 06/01/2016 1339   APPEARANCEUR CLEAR 06/01/2016 1339   APPEARANCEUR Cloudy 03/03/2013 2303   LABSPEC 1.009 06/01/2016 1339   LABSPEC 1.012 03/03/2013 2303   PHURINE 7.0 06/01/2016 1339   GLUCOSEU 50 (A) 06/01/2016 1339   GLUCOSEU NEGATIVE 09/07/2014 1536   HGBUR NEGATIVE 06/01/2016 1339   BILIRUBINUR NEGATIVE 06/01/2016 1339   BILIRUBINUR neg 01/31/2015 1400   BILIRUBINUR Negative 03/03/2013 2303   KETONESUR NEGATIVE 06/01/2016 1339   PROTEINUR 100 (A) 06/01/2016 1339   UROBILINOGEN 0.2 01/31/2015 1400   UROBILINOGEN 0.2 09/07/2014 1536   NITRITE NEGATIVE 06/01/2016 1339   LEUKOCYTESUR NEGATIVE 06/01/2016 1339   LEUKOCYTESUR 2+ 03/03/2013 2303   Radiology Studies: Ct Shoulder Left Wo Contrast  Result Date: 06/03/2016 CLINICAL DATA:  Left shoulder and neck pain.  Recurrent falls. EXAM: CT OF THE UPPER LEFT EXTREMITY WITHOUT CONTRAST TECHNIQUE: Multidetector CT imaging of the upper left extremity was performed according to the standard protocol. COMPARISON:  None. FINDINGS: Bones/Joint/Cartilage The humeral head is located. No fracture is identified. There is no bony [place]. The acromioclavicular joint is intact with mild to moderate degenerative change seen. Ligaments Suboptimally assessed by CT. Muscles and Tendons Intact and normal in appearance. Soft  tissues Imaged lung parenchyma demonstrates mild dependent atelectasis. Extensive calcific aortic and coronary atherosclerosis is noted. There is cardiomegaly. No left pleural effusion or pericardial effusion. IMPRESSION: No acute abnormality. Mild to moderate acromioclavicular osteoarthritis. Cardiomegaly. Calcific aortic and coronary atherosclerosis. Electronically Signed   By: Inge Rise M.D.   On: 06/03/2016 14:41      Scheduled Meds: . aspirin EC  81 mg Oral Daily  . carvedilol  6.25 mg Oral BID  . cholecalciferol  1,000 Units Oral Daily  . escitalopram  10 mg Oral Daily  . ferrous sulfate  325 mg Oral Q breakfast  .  furosemide  40 mg Intravenous Daily  . heparin  5,000 Units Subcutaneous Q8H  . hydroxychloroquine  200 mg Oral Daily  . insulin aspart  3-6 Units Subcutaneous TID WC  . insulin glargine  45 Units Subcutaneous q morning - 10a  . isosorbide mononitrate  30 mg Oral Daily  . ketotifen  2 drop Right Eye BID  . loratadine  10 mg Oral Daily  . multivitamin  1 tablet Oral Daily  . multivitamin with minerals  1 tablet Oral Daily  . pantoprazole  40 mg Oral Daily  . sodium chloride flush  3 mL Intravenous Q12H  . tamsulosin  0.4 mg Oral Daily  . tiotropium  18 mcg Inhalation Daily  . vitamin C  500 mg Oral Daily   Continuous Infusions:   LOS: 0 days   Time spent: 20 minutes   Faye Ramsay, MD [facility] Pager [phone] Cell [phone]  If 7PM-7AM, please contact night-coverage [email or web address] Password [facility] 06/03/2016, 3:39 PM

## 2016-06-03 NOTE — Progress Notes (Signed)
Patient setting on side of bed. Assist him to lay down and he had 5 bts v-tach. Patient voiced no complaints. R.N. Aware.

## 2016-06-03 NOTE — Progress Notes (Signed)
OT Cancellation Note  Patient Details [name] MRN: [id number] DOB: [birth year]   Cancelled Treatment:    Reason Eval/Treat Not Completed: Other (comment) Pt is Medicare and current D/C plan is SNF. No apparent immediate acute care OT needs, therefore will defer OT to SNF. If OT eval is needed please call Acute Rehab Dept. at [phone] or text page OT at [phone].   [place], OT/L  [id number] 06/03/2016 06/03/2016, 3:01 PM

## 2016-06-03 NOTE — Progress Notes (Signed)
Chart reviewed. Pt recently at [facility] and does not appear to have supervision at discharge.  He has made functional gains, but will likely continue to require supervision at discharge.  Will defer formal rehab consult at this time.  Will cont to follow for improvement.  Delice Lesch, MD, Mell[facility]rown

## 2016-06-04 ENCOUNTER — Encounter
Admission: RE | Admit: 2016-06-04 | Discharge: 2016-06-04 | Disposition: A | Discharge status: Home / Self Care | Payer: Medicare Other | Source: Ambulatory Visit | Attending: Internal Medicine | Admitting: Internal Medicine

## 2016-06-04 DIAGNOSIS — F419 Anxiety disorder, unspecified: Secondary | ICD-10-CM | POA: Diagnosis not present

## 2016-06-04 DIAGNOSIS — H3530 Unspecified macular degeneration: Secondary | ICD-10-CM | POA: Diagnosis not present

## 2016-06-04 DIAGNOSIS — M6281 Muscle weakness (generalized): Secondary | ICD-10-CM | POA: Diagnosis not present

## 2016-06-04 DIAGNOSIS — F5105 Insomnia due to other mental disorder: Secondary | ICD-10-CM | POA: Diagnosis not present

## 2016-06-04 DIAGNOSIS — E871 Hypo-osmolality and hyponatremia: Secondary | ICD-10-CM | POA: Diagnosis not present

## 2016-06-04 DIAGNOSIS — I5033 Acute on chronic diastolic (congestive) heart failure: Secondary | ICD-10-CM | POA: Diagnosis not present

## 2016-06-04 DIAGNOSIS — I69334 Monoplegia of upper limb following cerebral infarction affecting left non-dominant side: Secondary | ICD-10-CM | POA: Diagnosis not present

## 2016-06-04 DIAGNOSIS — E161 Other hypoglycemia: Secondary | ICD-10-CM | POA: Diagnosis not present

## 2016-06-04 DIAGNOSIS — R296 Repeated falls: Secondary | ICD-10-CM | POA: Diagnosis not present

## 2016-06-04 DIAGNOSIS — E162 Hypoglycemia, unspecified: Secondary | ICD-10-CM | POA: Diagnosis not present

## 2016-06-04 DIAGNOSIS — A0811 Acute gastroenteropathy due to Norwalk agent: Secondary | ICD-10-CM | POA: Diagnosis not present

## 2016-06-04 DIAGNOSIS — R0600 Dyspnea, unspecified: Secondary | ICD-10-CM | POA: Diagnosis not present

## 2016-06-04 DIAGNOSIS — R0602 Shortness of breath: Secondary | ICD-10-CM | POA: Diagnosis not present

## 2016-06-04 DIAGNOSIS — I509 Heart failure, unspecified: Secondary | ICD-10-CM | POA: Diagnosis not present

## 2016-06-04 DIAGNOSIS — R262 Difficulty in walking, not elsewhere classified: Secondary | ICD-10-CM | POA: Diagnosis not present

## 2016-06-04 DIAGNOSIS — M1990 Unspecified osteoarthritis, unspecified site: Secondary | ICD-10-CM | POA: Diagnosis not present

## 2016-06-04 DIAGNOSIS — D638 Anemia in other chronic diseases classified elsewhere: Secondary | ICD-10-CM | POA: Diagnosis not present

## 2016-06-04 DIAGNOSIS — M25512 Pain in left shoulder: Secondary | ICD-10-CM | POA: Diagnosis not present

## 2016-06-04 DIAGNOSIS — Z794 Long term (current) use of insulin: Secondary | ICD-10-CM | POA: Diagnosis not present

## 2016-06-04 DIAGNOSIS — N184 Chronic kidney disease, stage 4 (severe): Secondary | ICD-10-CM | POA: Diagnosis not present

## 2016-06-04 DIAGNOSIS — H353190 Nonexudative age-related macular degeneration, unspecified eye, stage unspecified: Secondary | ICD-10-CM | POA: Diagnosis not present

## 2016-06-04 DIAGNOSIS — E1142 Type 2 diabetes mellitus with diabetic polyneuropathy: Secondary | ICD-10-CM | POA: Diagnosis not present

## 2016-06-04 DIAGNOSIS — N400 Enlarged prostate without lower urinary tract symptoms: Secondary | ICD-10-CM | POA: Diagnosis not present

## 2016-06-04 DIAGNOSIS — E785 Hyperlipidemia, unspecified: Secondary | ICD-10-CM | POA: Diagnosis not present

## 2016-06-04 DIAGNOSIS — E119 Type 2 diabetes mellitus without complications: Secondary | ICD-10-CM | POA: Diagnosis not present

## 2016-06-04 DIAGNOSIS — R0609 Other forms of dyspnea: Secondary | ICD-10-CM | POA: Diagnosis not present

## 2016-06-04 DIAGNOSIS — K529 Noninfective gastroenteritis and colitis, unspecified: Secondary | ICD-10-CM | POA: Diagnosis not present

## 2016-06-04 DIAGNOSIS — I441 Atrioventricular block, second degree: Secondary | ICD-10-CM | POA: Diagnosis not present

## 2016-06-04 DIAGNOSIS — R197 Diarrhea, unspecified: Secondary | ICD-10-CM | POA: Diagnosis not present

## 2016-06-04 DIAGNOSIS — Z886 Allergy status to analgesic agent status: Secondary | ICD-10-CM | POA: Diagnosis not present

## 2016-06-04 DIAGNOSIS — I5032 Chronic diastolic (congestive) heart failure: Secondary | ICD-10-CM | POA: Diagnosis not present

## 2016-06-04 DIAGNOSIS — I5042 Chronic combined systolic (congestive) and diastolic (congestive) heart failure: Secondary | ICD-10-CM | POA: Diagnosis not present

## 2016-06-04 DIAGNOSIS — K219 Gastro-esophageal reflux disease without esophagitis: Secondary | ICD-10-CM | POA: Diagnosis not present

## 2016-06-04 DIAGNOSIS — N183 Chronic kidney disease, stage 3 (moderate): Secondary | ICD-10-CM | POA: Diagnosis not present

## 2016-06-04 DIAGNOSIS — M7989 Other specified soft tissue disorders: Secondary | ICD-10-CM | POA: Diagnosis not present

## 2016-06-04 DIAGNOSIS — D509 Iron deficiency anemia, unspecified: Secondary | ICD-10-CM | POA: Diagnosis not present

## 2016-06-04 DIAGNOSIS — I82502 Chronic embolism and thrombosis of unspecified deep veins of left lower extremity: Secondary | ICD-10-CM | POA: Diagnosis not present

## 2016-06-04 DIAGNOSIS — R001 Bradycardia, unspecified: Secondary | ICD-10-CM | POA: Diagnosis not present

## 2016-06-04 DIAGNOSIS — R531 Weakness: Secondary | ICD-10-CM | POA: Diagnosis not present

## 2016-06-04 DIAGNOSIS — I482 Chronic atrial fibrillation: Secondary | ICD-10-CM | POA: Diagnosis not present

## 2016-06-04 DIAGNOSIS — I2510 Atherosclerotic heart disease of native coronary artery without angina pectoris: Secondary | ICD-10-CM | POA: Diagnosis not present

## 2016-06-04 DIAGNOSIS — I110 Hypertensive heart disease with heart failure: Secondary | ICD-10-CM | POA: Diagnosis not present

## 2016-06-04 DIAGNOSIS — M0589 Other rheumatoid arthritis with rheumatoid factor of multiple sites: Secondary | ICD-10-CM | POA: Diagnosis not present

## 2016-06-04 DIAGNOSIS — E11649 Type 2 diabetes mellitus with hypoglycemia without coma: Secondary | ICD-10-CM | POA: Diagnosis not present

## 2016-06-04 DIAGNOSIS — D631 Anemia in chronic kidney disease: Secondary | ICD-10-CM | POA: Diagnosis not present

## 2016-06-04 DIAGNOSIS — I10 Essential (primary) hypertension: Secondary | ICD-10-CM | POA: Diagnosis not present

## 2016-06-04 DIAGNOSIS — R600 Localized edema: Secondary | ICD-10-CM | POA: Diagnosis not present

## 2016-06-04 DIAGNOSIS — Z7401 Bed confinement status: Secondary | ICD-10-CM | POA: Diagnosis not present

## 2016-06-04 DIAGNOSIS — I130 Hypertensive heart and chronic kidney disease with heart failure and stage 1 through stage 4 chronic kidney disease, or unspecified chronic kidney disease: Secondary | ICD-10-CM | POA: Diagnosis not present

## 2016-06-04 DIAGNOSIS — E1122 Type 2 diabetes mellitus with diabetic chronic kidney disease: Secondary | ICD-10-CM | POA: Diagnosis not present

## 2016-06-04 DIAGNOSIS — J449 Chronic obstructive pulmonary disease, unspecified: Secondary | ICD-10-CM | POA: Diagnosis not present

## 2016-06-04 DIAGNOSIS — J309 Allergic rhinitis, unspecified: Secondary | ICD-10-CM | POA: Diagnosis not present

## 2016-06-04 DIAGNOSIS — R1900 Intra-abdominal and pelvic swelling, mass and lump, unspecified site: Secondary | ICD-10-CM | POA: Diagnosis not present

## 2016-06-04 DIAGNOSIS — F329 Major depressive disorder, single episode, unspecified: Secondary | ICD-10-CM | POA: Diagnosis not present

## 2016-06-04 DIAGNOSIS — I350 Nonrheumatic aortic (valve) stenosis: Secondary | ICD-10-CM | POA: Diagnosis not present

## 2016-06-04 DIAGNOSIS — W19XXXA Unspecified fall, initial encounter: Secondary | ICD-10-CM | POA: Diagnosis not present

## 2016-06-04 DIAGNOSIS — R6889 Other general symptoms and signs: Secondary | ICD-10-CM | POA: Diagnosis not present

## 2016-06-04 DIAGNOSIS — Z9889 Other specified postprocedural states: Secondary | ICD-10-CM | POA: Diagnosis not present

## 2016-06-04 DIAGNOSIS — K31819 Angiodysplasia of stomach and duodenum without bleeding: Secondary | ICD-10-CM | POA: Diagnosis not present

## 2016-06-04 LAB — BASIC METABOLIC PANEL
Anion gap: 10 (ref 5–15)
BUN: 48 mg/dL — ABNORMAL HIGH (ref 6–20)
CALCIUM: 8.6 mg/dL — AB (ref 8.9–10.3)
CO2: 21 mmol/L — AB (ref 22–32)
CREATININE: 2.24 mg/dL — AB (ref 0.61–1.24)
Chloride: 98 mmol/L — ABNORMAL LOW (ref 101–111)
GFR, EST AFRICAN AMERICAN: 29 mL/min — AB (ref >60)
GFR, EST NON AFRICAN AMERICAN: 25 mL/min — AB (ref >60)
Glucose, Bld: 184 mg/dL — ABNORMAL HIGH (ref 65–99)
Potassium: 4.1 mmol/L (ref 3.5–5.1)
SODIUM: 129 mmol/L — AB (ref 135–145)

## 2016-06-04 LAB — GLUCOSE, CAPILLARY
GLUCOSE-CAPILLARY: 135 mg/dL — AB (ref 65–99)
GLUCOSE-CAPILLARY: 122 mg/dL — AB (ref 65–99)
Glucose-Capillary: 212 mg/dL — ABNORMAL HIGH (ref 65–99)

## 2016-06-04 LAB — CBC
HCT: 25.2 % — ABNORMAL LOW (ref 39.0–52.0)
Hemoglobin: 8 g/dL — ABNORMAL LOW (ref 13.0–17.0)
MCH: 26.8 pg (ref 26.0–34.0)
MCHC: 31.7 g/dL (ref 30.0–36.0)
MCV: 84.6 fL (ref 78.0–100.0)
PLATELETS: 210 10*3/uL (ref 150–400)
RBC: 2.98 MIL/uL — ABNORMAL LOW (ref 4.22–5.81)
RDW: 14.7 % (ref 11.5–15.5)
WBC: 5.1 10*3/uL (ref 4.0–10.5)

## 2016-06-04 NOTE — NC FL2 (Signed)
[place] MEDICAID FL2 LEVEL OF CARE SCREENING TOOL     IDENTIFICATION  Patient [name] Birthdate: Jul 28, 1927 Sex: male Admission Date (Current Location): 06/01/2016  [facility] and [place] Number:  Herbalist and Address:  The . [facility], [place] [address], [place], [place] [address]      Provider Number: [id number]  Attending Physician Name and Address:  Theodis Blaze, MD  Relative Name and Phone Number:       Current Level of Care: Hospital Recommended Level of Care: [place] Prior Approval Number:    Date Approved/Denied:   PASRR Number: [id number]  Discharge Plan: SNF    Current Diagnoses: Patient Active Problem List   Diagnosis Date Noted  . Frequent falls 06/01/2016  . Fall 06/01/2016  . Muscle weakness (generalized)   . COPD (chronic obstructive pulmonary disease) ([place])   . Anemia of chronic disease   . Upper GI bleed 09/19/2015  . Minimal cerumen bilateral ear canals 08/18/2015  . Persistent atrial fibrillation ([place])   . Insomnia secondary to anxiety 07/19/2015  . Chronic diastolic heart failure ([place]) 04/20/2015  . Macular degeneration, dry 01/20/2015  . Arthritis 01/20/2015  . 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 Diabetic polyneuropathy associated with type 2 diabetes mellitus ([place]) 08/05/2014  . CKD (chronic kidney disease) stage 3, GFR 30-59 ml/min 07/21/2014  . Hypertensive heart disease with chronic systolic congestive heart failure ([place]) 06/30/2014  . CVA (cerebral infarction) 06/29/2014  . Anemia in chronic kidney disease 11/10/2013  . Mobitz type 1 second degree atrioventricular block 10/01/2012  . S/P AVR 03/24/2012  . Aortic valve stenosis   . Junctional bradycardia   . Hyperlipidemia   . Angiodysplasia of stomach   . CAD (coronary artery disease)     Orientation RESPIRATION BLADDER Height & Weight     Self, Time, Situation, Place  Normal Incontinent  Weight: 234 lb 14.4 oz (106.5 kg) Height:     BEHAVIORAL SYMPTOMS/MOOD NEUROLOGICAL BOWEL NUTRITION STATUS      Continent    AMBULATORY STATUS COMMUNICATION OF NEEDS Skin   Limited Assist Verbally                         Personal Care Assistance Level of Assistance  Bathing, Feeding, Dressing Bathing Assistance: Limited assistance Feeding assistance: Independent Dressing Assistance: Limited assistance     Functional Limitations Info  Sight, Hearing, Speech Sight Info: Adequate Hearing Info: Adequate Speech Info: Adequate    SPECIAL CARE FACTORS FREQUENCY  PT (By licensed PT), OT (By licensed OT)     PT Frequency: 5x week OT Frequency: 5x week            Contractures Contractures Info: Not present    Additional Factors Info    Code Status Info: Full Allergies Info: [place] Aspirin, Losartan, Metoprolol, Penicillins           Current Medications (06/04/2016):  This is the current hospital active medication list Current Facility-Administered Medications  Medication Dose Route Frequency Provider Last Rate Last Dose  . acetaminophen (TYLENOL) tablet 650 mg  650 mg Oral Q6H PRN Shon Millet, DO   650 mg at 06/03/16 2112  . aspirin EC tablet 81 mg  81 mg Oral Daily [employer], DO   81 mg at 06/04/16 0843  . carvedilol (COREG) tablet 6.25 mg  6.25 mg Oral BID Shon Millet, DO   6.25  mg at 06/04/16 0842  . cholecalciferol (VITAMIN D) tablet 1,000 Units  1,000 Units Oral Daily Shon Millet, DO   1,000 Units at 06/04/16 [phone]  . escitalopram (LEXAPRO) tablet 10 mg  10 mg Oral Daily Shon Millet, DO   10 mg at 06/04/16 0841  . ferrous sulfate tablet 325 mg  325 mg Oral Q breakfast Shon Millet, DO   325 mg at 06/04/16 [id number]  . furosemide (LASIX) injection 40 mg  40 mg Intravenous Daily Theodis Blaze, MD   40 mg at 06/04/16 0843  . heparin injection 5,000 Units  5,000 Units Subcutaneous Q8H Jennifer  Chahn-Yang Choi, DO   5,000 Units at 06/04/16 1246  . hydroxychloroquine (PLAQUENIL) tablet 200 mg  200 mg Oral Daily Rich Fuchs Choi, DO   200 mg at 06/04/16 1246  . insulin aspart (novoLOG) injection 3-6 Units  3-6 Units Subcutaneous TID WC Einar Grad, RPH   3 Units at 06/04/16 1245  . insulin glargine (LANTUS) injection 45 Units  45 Units Subcutaneous q morning - 10a Theodis Blaze, MD   45 Units at 06/04/16 [phone]  . isosorbide mononitrate (IMDUR) 24 hr tablet 30 mg  30 mg Oral Daily Shon Millet, DO   30 mg at 06/04/16 0841  . ketotifen (ZADITOR) 0.025 % ophthalmic solution 2 drop  2 drop Right Eye BID Shon Millet, DO   2 drop at 06/04/16 [phone]  . loratadine (CLARITIN) tablet 10 mg  10 mg Oral Daily Shon Millet, DO   10 mg at 06/04/16 0841  . multivitamin (PROSIGHT) tablet 1 tablet  1 tablet Oral Daily Shon Millet, DO   1 tablet at 06/04/16 1000  . multivitamin with minerals tablet 1 tablet  1 tablet Oral Daily Shon Millet, DO   1 tablet at 06/04/16 [phone]  . pantoprazole (PROTONIX) EC tablet 40 mg  40 mg Oral Daily Shon Millet, DO   40 mg at 06/04/16 [id number]  . sodium chloride flush (NS) 0.9 % injection 3 mL  3 mL Intravenous Q12H Shon Millet, DO   3 mL at 06/03/16 2200  . tamsulosin (FLOMAX) capsule 0.4 mg  0.4 mg Oral Daily Shon Millet, DO   0.4 mg at 06/04/16 0843  . tiotropium (SPIRIVA) inhalation capsule 18 mcg  18 mcg Inhalation Daily Shon Millet, DO   18 mcg at 06/02/16 [id number]  . traMADol (ULTRAM) tablet 50 mg  50 mg Oral Q6H PRN Maren Reamer, MD   50 mg at 06/03/16 2112  . vitamin C (ASCORBIC ACID) tablet 500 mg  500 mg Oral Daily Shon Millet, DO   500 mg at 06/04/16 [id number]     Discharge Medications: Please see discharge summary for a list of discharge medications.  Relevant Imaging Results:  Relevant Lab Results:   Additional  Information SS#[id number]  Wende Neighbors, LCSW

## 2016-06-04 NOTE — Progress Notes (Signed)
Called report to [facility] place. Iv removed. No issues at present.   Rithika Seel, Mervin Kung RN

## 2016-06-04 NOTE — Discharge Instructions (Signed)
Fall Prevention in the Home Introduction Falls can cause injuries. They can happen to people of all ages. There are many things you can do to make your home safe and to help prevent falls. What can I do on the outside of my home?  Regularly fix the edges of walkways and driveways and fix any cracks.  Remove anything that might make you trip as you walk through a door, such as a raised step or threshold.  Trim any bushes or trees on the path to your home.  Use bright outdoor lighting.  Clear any walking paths of anything that might make someone trip, such as rocks or tools.  Regularly check to see if handrails are loose or broken. Make sure that both sides of any steps have handrails.  Any raised decks and porches should have guardrails on the edges.  Have any leaves, snow, or ice cleared regularly.  Use sand or salt on walking paths during winter.  Clean up any spills in your garage right away. This includes oil or grease spills. What can I do in the bathroom?  Use night lights.  Install grab bars by the toilet and in the tub and shower. Do not use towel bars as grab bars.  Use non-skid mats or decals in the tub or shower.  If you need to sit down in the shower, use a plastic, non-slip stool.  Keep the floor dry. Clean up any water that spills on the floor as soon as it happens.  Remove soap buildup in the tub or shower regularly.  Attach bath mats securely with double-sided non-slip rug tape.  Do not have throw rugs and other things on the floor that can make you trip. What can I do in the bedroom?  Use night lights.  Make sure that you have a light by your bed that is easy to reach.  Do not use any sheets or blankets that are too big for your bed. They should not hang down onto the floor.  Have a firm chair that has side arms. You can use this for support while you get dressed.  Do not have throw rugs and other things on the floor that can make you trip. What can  I do in the kitchen?  Clean up any spills right away.  Avoid walking on wet floors.  Keep items that you use a lot in easy-to-reach places.  If you need to reach something above you, use a strong step stool that has a grab bar.  Keep electrical cords out of the way.  Do not use floor polish or wax that makes floors slippery. If you must use wax, use non-skid floor wax.  Do not have throw rugs and other things on the floor that can make you trip. What can I do with my stairs?  Do not leave any items on the stairs.  Make sure that there are handrails on both sides of the stairs and use them. Fix handrails that are broken or loose. Make sure that handrails are as long as the stairways.  Check any carpeting to make sure that it is firmly attached to the stairs. Fix any carpet that is loose or worn.  Avoid having throw rugs at the top or bottom of the stairs. If you do have throw rugs, attach them to the floor with carpet tape.  Make sure that you have a light switch at the top of the stairs and the bottom of the stairs. If you   do not have them, ask someone to add them for you. What else can I do to help prevent falls?  Wear shoes that:  Do not have high heels.  Have rubber bottoms.  Are comfortable and fit you well.  Are closed at the toe. Do not wear sandals.  If you use a stepladder:  Make sure that it is fully opened. Do not climb a closed stepladder.  Make sure that both sides of the stepladder are locked into place.  Ask someone to hold it for you, if possible.  Clearly mark and make sure that you can see:  Any grab bars or handrails.  First and last steps.  Where the edge of each step is.  Use tools that help you move around (mobility aids) if they are needed. These include:  Canes.  Walkers.  Scooters.  Crutches.  Turn on the lights when you go into a dark area. Replace any light bulbs as soon as they burn out.  Set up your furniture so you have a  clear path. Avoid moving your furniture around.  If any of your floors are uneven, fix them.  If there are any pets around you, be aware of where they are.  Review your medicines with your doctor. Some medicines can make you feel dizzy. This can increase your chance of falling. Ask your doctor what other things that you can do to help prevent falls. This information is not intended to replace advice given to you by your health care provider. Make sure you discuss any questions you have with your health care provider. Document Released: 03/23/2009 Document Revised: 11/02/2015 Document Reviewed: 07/01/2014  2017 Elsevier  

## 2016-06-04 NOTE — Progress Notes (Signed)
Clinical Social Worker facilitated patient discharge including contacting patient family and facility to confirm patient discharge plans.  Clinical information faxed to facility and family agreeable with plan.  Patients daughter-in-law Jackelyn Poling will be transporting patient to facility via personal vechicle .  RN Jerene Pitch to call [phone] for report prior to discharge.  Clinical Social Worker will sign off for now as social work intervention is no longer needed. Please consult [place] again if new need arises.  Rhea Pink, MSW, [place]

## 2016-06-04 NOTE — Discharge Summary (Addendum)
Physician Discharge Summary  [name] [id number] DOB: 05-Dec-1927 DOA: 06/01/2016  PCP: [name], MD  Admit date: 06/01/2016 Discharge date: 06/04/2016  Recommendations for Outpatient Follow-up:  1. Pt will need to follow up with PCP in 1-2 weeks post discharge 2. Please obtain BMP in 1-2 days to evaluate electrolytes and kidney function 3. Please also check CBC to evaluate Hg and Hct levels 4. Weight stable at 234 lbs, readjust the dose of Lasix as clinically indicated   Discharge Diagnoses:  Principal Problem:   Frequent falls  Discharge Condition: Stable  Diet recommendation: Heart healthy diet discussed in details   PCP: [name], MD             Brief Narrative:  [name] 82 y.o.malewith medical history significant of CAD, previous CVA with residual left side deficits, CHF, GERD, anemia who presented to the hospital after an episode of fall. Pt is not sure what exactly happened but was on the ground for ~ 10 + hours. This is hies 3rd fall in the past week.   Assessment & Plan:  Falls, recurrent  - CT head negative for acute injury. - PT/OT to eval requested, SNF recommended  - bed available today   Permanent atrial fibrillation - Continue aspirin, coreg, imdur  - no chest pain overnight or this AM  - pt reports feeling better this AM   Left shoulder pain - CT shoulder requested, no acute findings - will need continuation of PT/OT  CAD - Continue imdur, aspirin, coreg   Hx CVA - Residual left sided deficits - SNF recommended per PT evaluation, bed available today   Acute on Chronic diastolic heart failure - Sees Dr. Rockey Situ in office  - lasix changed to IV as pt more LE edema and crackles at bases  - last ECHO [id number] --> Systolic function was normal. The estimated ejection fraction was in the range of 55% to 60%. Wall motion was normal; there were no regional wall motion abnormalities.  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 Acie Fredrickson son  Disposition Plan: SNF today   Consultants:   None  Procedures:   None  Antimicrobials:   None  Procedures/Studies: Dg Chest 2 View  Result Date: 06/01/2016 CLINICAL DATA:  Fall, weakness, hypertension, knee pain EXAM: CHEST  2 VIEW COMPARISON:  05/05/2016 FINDINGS: Previous median sternotomy noted. Cardiomegaly with vascular congestion and slight diffuse interstitial prominence. Volume overload or early edema not entirely excluded. No focal pneumonia, collapse or consolidation. No large effusion or pneumothorax. Right costophrenic angle is excluded on film. Aorta is atherosclerotic. Trachea remains midline. IMPRESSION: Cardiomegaly with vascular congestion and interstitial prominence suggesting volume overload or early edema. Electronically Signed   By: Jerilynn Mages.  Shick M.D.  On: 06/01/2016 14:54   Ct Head Wo Contrast  Result Date: 06/01/2016 CLINICAL DATA:  Recurrent falls, left frontal head injury EXAM: CT HEAD WITHOUT CONTRAST TECHNIQUE: Contiguous axial images were obtained from the base of the skull through the vertex without intravenous contrast. COMPARISON:  05/29/2016 FINDINGS: Brain: Similar moderately severe brain atrophy pattern with chronic white matter microvascular changes throughout the cerebral hemispheres. Remote left  frontal infarct with encephalomalacia. Cerebellar atrophy as well. No acute intracranial hemorrhage, mass lesion, new infarction, midline shift, herniation, hydrocephalus, or extra-axial fluid collection. Cisterns are patent. Vascular: No hyperdense vessel or unexpected calcification. Skull: Normal. Negative for fracture or focal lesion. Sinuses/Orbits: Minor ethmoid mucosal thickening. Other sinuses and mastoids are clear. No orbital abnormality. Other: Left anterior frontal and anterior orbital soft tissue bruising noted. IMPRESSION: Left frontal and orbital soft tissue bruising. No acute intracranial finding or interval change. Stable brain atrophy, microvascular ischemic changes, and remote infarcts. Electronically Signed   By: Jerilynn Mages.  Shick M.D.   On: 06/01/2016 14:42   Ct Head Wo Contrast  Result Date: 05/29/2016 CLINICAL DATA:  82 year old male with fall and laceration to the forehead. EXAM: CT HEAD WITHOUT CONTRAST CT CERVICAL SPINE WITHOUT CONTRAST TECHNIQUE: Multidetector CT imaging of the head and cervical spine was performed following the standard protocol without intravenous contrast. Multiplanar CT image reconstructions of the cervical spine were also generated. COMPARISON:  Head CT dated 06/27/2014 FINDINGS: CT HEAD FINDINGS Brain: There is moderate age-related atrophy and chronic microvascular ischemic changes. Focal area of old infarct and encephalomalacia noted in the left frontal lobe inferiorly. There is no acute intracranial hemorrhage. No mass effect or midline shift noted. No extra-axial fluid collections. Mildly dilated CSF spaces or arachnoid cyst in the posterior fossa. Vascular: No hyperdense vessel or unexpected calcification. Skull: Normal. Negative for fracture or focal lesion. Sinuses/Orbits: There is mild mucoperiosteal thickening of paranasal sinuses with partial opacification of the ethmoid air cells. No air-fluid levels. The mastoid air cells are clear. Other: Left forehead  laceration and small hematoma. CT CERVICAL SPINE FINDINGS Alignment: No acute subluxation. Skull base and vertebrae: No acute fracture. The bones are osteopenic. Soft tissues and spinal canal: No prevertebral fluid or swelling. No visible canal hematoma. Disc levels: Multilevel disc disease most prominent at C5-C6 and C6-C7. Facet hypertrophy noted most prominent at C3-C4. Upper chest: Negative. Other: None IMPRESSION: No acute intracranial hemorrhage. No acute/traumatic cervical spine pathology. Electronically Signed   By: Anner Crete M.D.   On: 05/29/2016 04:19   Ct Cervical Spine Wo Contrast  Result Date: 05/29/2016 CLINICAL DATA:  82 year old male with fall and laceration to the forehead. EXAM: CT HEAD WITHOUT CONTRAST CT CERVICAL SPINE WITHOUT CONTRAST TECHNIQUE: Multidetector CT imaging of the head and cervical spine was performed following the standard protocol without intravenous contrast. Multiplanar CT image reconstructions of the cervical spine were also generated. COMPARISON:  Head CT dated 06/27/2014 FINDINGS: CT HEAD FINDINGS Brain: There is moderate age-related atrophy and chronic microvascular ischemic changes. Focal area of old infarct and encephalomalacia noted in the left frontal lobe inferiorly. There is no acute intracranial hemorrhage. No mass effect or midline shift noted. No extra-axial fluid collections. Mildly dilated CSF spaces or arachnoid cyst in the posterior fossa. Vascular: No hyperdense vessel or unexpected calcification. Skull: Normal. Negative for fracture or focal lesion. Sinuses/Orbits: There is mild mucoperiosteal thickening of paranasal sinuses with partial opacification of the ethmoid air cells. No air-fluid levels. The mastoid air cells are clear. Other: Left forehead laceration and  small hematoma. CT CERVICAL SPINE FINDINGS Alignment: No acute subluxation. Skull base and vertebrae: No acute fracture. The bones are osteopenic. Soft tissues and spinal canal: No  prevertebral fluid or swelling. No visible canal hematoma. Disc levels: Multilevel disc disease most prominent at C5-C6 and C6-C7. Facet hypertrophy noted most prominent at C3-C4. Upper chest: Negative. Other: None IMPRESSION: No acute intracranial hemorrhage. No acute/traumatic cervical spine pathology. Electronically Signed   By: Anner Crete M.D.   On: 05/29/2016 04:19   Ct Shoulder Left Wo Contrast  Result Date: 06/03/2016 CLINICAL DATA:  Left shoulder and neck pain.  Recurrent falls. EXAM: CT OF THE UPPER LEFT EXTREMITY WITHOUT CONTRAST TECHNIQUE: Multidetector CT imaging of the upper left extremity was performed according to the standard protocol. COMPARISON:  None. FINDINGS: Bones/Joint/Cartilage The humeral head is located. No fracture is identified. There is no bony [place]. The acromioclavicular joint is intact with mild to moderate degenerative change seen. Ligaments Suboptimally assessed by CT. Muscles and Tendons Intact and normal in appearance. Soft tissues Imaged lung parenchyma demonstrates mild dependent atelectasis. Extensive calcific aortic and coronary atherosclerosis is noted. There is cardiomegaly. No left pleural effusion or pericardial effusion. IMPRESSION: No acute abnormality. Mild to moderate acromioclavicular osteoarthritis. Cardiomegaly. Calcific aortic and coronary atherosclerosis. Electronically Signed   By: Inge Rise M.D.   On: 06/03/2016 14:41   Dg Chest Port 1 View  Result Date: 05/05/2016 CLINICAL DATA:  Shortness of breath. EXAM: PORTABLE CHEST 1 VIEW COMPARISON:  05/02/2016. FINDINGS: Trachea is midline. Heart is enlarged. Lungs are clear. No pleural fluid. IMPRESSION: No acute findings. Electronically Signed   By: Lorin Picket M.D.   On: 05/05/2016 18:50   Dg Knee Complete 4 Views Left  Result Date: 06/01/2016 CLINICAL DATA:  Fall, knee pain EXAM: LEFT KNEE - COMPLETE 4+ VIEW COMPARISON:  None available FINDINGS: Mild-to-moderate left knee  tricompartmental osteoarthritis with joint space loss, sclerosis and bony spurring. Normal alignment. No acute osseous finding, fracture, malalignment or effusion. Peripheral atherosclerosis noted. Bones are osteopenic. IMPRESSION: Osteopenia and osteoarthritis. No acute finding by plain radiography Peripheral atherosclerosis Electronically Signed   By: Jerilynn Mages.  Shick M.D.   On: 06/01/2016 14:55   Dg Knee Right Port  Result Date: 05/05/2016 CLINICAL DATA:  82 y/o  M; status post fall with right knee pain. EXAM: PORTABLE RIGHT KNEE - 1-2 VIEW COMPARISON:  None. FINDINGS: Osteoarthrosis of the knee with moderate lateral femorotibial, mild medial femorotibial, and severe femorotibial compartment joint space narrowing. There fibrocystic changes patellofemoral compartment and articular sclerosis of and lateral femorotibial compartment. Small periarticular osteophytes are present. Vascular calcifications. Small suprapatellar joint effusion. No acute fracture or dislocation. IMPRESSION: Tricompartmental osteoarthrosis of the knee greatest in the patellofemoral compartment with there is severe joint space narrowing. Small joint effusion. No acute fracture or dislocation identified. Electronically Signed   By: Kristine Garbe M.D.   On: 05/05/2016 22:08     Discharge Exam: Vitals:   06/04/16 0847 06/04/16 1438  BP: (!) 143/53 (!) 116/44  Pulse: 65 (!) 58  Resp:  18  Temp:  97.5 F (36.4 C)   Vitals:   06/04/16 0500 06/04/16 0610 06/04/16 0847 06/04/16 1438  BP:  (!) 145/55 (!) 143/53 (!) 116/44  Pulse:  (!) 56 65 (!) 58  Resp:  18  18  Temp:  97.8 F (36.6 C)  97.5 F (36.4 C)  TempSrc:  Oral  Oral  SpO2:  98%  100%  Weight: 106.5 kg (234 lb 12.6 oz) 106.5 kg (  234 lb 14.4 oz)      General: Pt is alert, follows commands appropriately, not in acute distress Cardiovascular: Regular rate and rhythm, S1/S2 +, no rubs, no gallops Respiratory: Clear to auscultation bilaterally, no wheezing, no  crackles, no rhonchi Abdominal: Soft, non tender, non distended, bowel sounds +, no guarding Extremities: trace bilateral LE edema, no cyanosis, pulses palpable bilaterally DP and PT Neuro: Grossly nonfocal  Discharge Instructions  Discharge Instructions    Diet - low sodium heart healthy    Complete by:  As directed    Increase activity slowly    Complete by:  As directed      Allergies as of 06/04/2016      Reactions   Asa [aspirin] Other (See Comments)   Reaction:  Caused stomach ulcer patient can take 81 mg.   Losartan Other (See Comments)   Decreased pulse rate   Metoprolol Other (See Comments)   Reaction:  Bradycardia    Penicillins Swelling, Other (See Comments)   Has patient had a PCN reaction causing immediate rash, facial/tongue/throat swelling, SOB or lightheadedness with hypotension: No Has patient had a PCN reaction causing severe rash involving mucus membranes or skin necrosis: No Has patient had a PCN reaction that required hospitalization No Has patient had a PCN reaction occurring within the last 10 years: No If all of the above answers are "NO", then may proceed with Cephalosporin use.      Medication List    TAKE these medications   acetaminophen 325 MG tablet Commonly known as:  TYLENOL Take 2 tablets (650 mg total) by mouth every 6 (six) hours as needed for mild pain (or Fever >/= 101).   aspirin EC 81 MG tablet Take 1 tablet (81 mg total) by mouth daily.   azelastine 0.05 % ophthalmic solution Commonly known as:  OPTIVAR Place 1 drop into the right eye 2 (two) times daily.   calcitRIOL 0.25 MCG capsule Commonly known as:  ROCALTROL TAKE ONE CAPSULE THREE TIMES A WEEK (MONDAY, WEDNESDAY, AND FRIDAY)   carvedilol 6.25 MG tablet Commonly known as:  COREG Take 1 tablet (6.25 mg total) by mouth 2 (two) times daily.   cholecalciferol 1000 units tablet Commonly known as:  VITAMIN D Take 1,000 Units by mouth daily.   escitalopram 10 MG  tablet Commonly known as:  LEXAPRO TAKE 1 TABLET EVERY DAY WITH DINNER   ferrous sulfate 325 (65 FE) MG tablet Take 325 mg by mouth daily with breakfast.   furosemide 80 MG tablet Commonly known as:  LASIX Take 1 tablet (80 mg total) by mouth daily.   hydroxychloroquine 200 MG tablet Commonly known as:  PLAQUENIL Take 200 mg by mouth daily.   insulin aspart 100 UNIT/ML injection Commonly known as:  novoLOG Inject 5-7 Units into the skin See admin instructions. Inject 5-7 units subcutaneously 3 times daily with meals per sliding scale: CBG 101-150 3 units, 151-200 4 units, 201-250 5 units, 251-300 7 units.   insulin glargine 100 UNIT/ML injection Commonly known as:  LANTUS Inject 45 Units into the skin daily.   isosorbide mononitrate 30 MG 24 hr tablet Commonly known as:  IMDUR Take 30 mg by mouth daily.   loratadine 10 MG tablet Commonly known as:  CLARITIN Take 10 mg by mouth daily.   multivitamin with minerals Tabs tablet Take 1 tablet by mouth daily.   pantoprazole 40 MG tablet Commonly known as:  PROTONIX Take 1 tablet (40 mg total) by mouth daily.  PRESERVISION AREDS 2 Caps Take 1 capsule by mouth 2 (two) times daily.   tamsulosin 0.4 MG Caps capsule Commonly known as:  FLOMAX TAKE 1 CAPSULE BY MOUTH DAILY AFTER SUPPER   tiotropium 18 MCG inhalation capsule Commonly known as:  SPIRIVA Place 1 capsule (18 mcg total) into inhaler and inhale daily.   vitamin C 500 MG tablet Commonly known as:  ASCORBIC ACID Take 500 mg by mouth daily.      Follow-up Information    [name], MD Follow up.   Specialty:  Internal Medicine Contact information: [address] [place] [address] [phone]            The results of significant diagnostics from this hospitalization (including imaging, microbiology, ancillary and laboratory) are listed below for reference.     Microbiology: No results found for this or any previous visit  (from the past 240 hour(s)).   Labs: Basic Metabolic Panel:  Recent Labs Lab 06/01/16 1500 06/02/16 0232 06/04/16 0157  NA 137 135 129*  K 4.7 4.5 4.1  CL 103 102 98*  CO2 25 24 21*  GLUCOSE 172* 236* 184*  BUN 41* 39* 48*  CREATININE 2.11* 1.95* 2.24*  CALCIUM 9.2 9.0 8.6*   Liver Function Tests:  Recent Labs Lab 06/01/16 1500  AST 31  ALT 16*  ALKPHOS 107  BILITOT 1.0  PROT 6.6  ALBUMIN 3.4*   CBC:  Recent Labs Lab 06/01/16 1512 06/02/16 0232 06/04/16 0157  WBC 7.8 6.4 5.1  HGB 9.4* 8.8* 8.0*  HCT 29.7* 27.8* 25.2*  MCV 84.9 84.8 84.6  PLT 253 250 210   Cardiac Enzymes:  Recent Labs Lab 06/01/16 1500  CKTOTAL 124  CKMB 4.0  TROPONINI 0.03*   BNP: BNP (last 3 results)  Recent Labs  07/29/15 0005 05/02/16 0926 05/14/16 0420  BNP 314.2* 243.8* 266.9*    ProBNP (last 3 results)  Recent Labs  07/10/15 1330  PROBNP 211.0*    CBG:  Recent Labs Lab 06/03/16 1757 06/03/16 1850 06/03/16 2038 06/04/16 0604 06/04/16 1115  GLUCAP 66 141* 244* 135* 122*   SIGNED: Time coordinating discharge: 30 minutes  MAGICK-Mareena Cavan, MD  [facility] 06/04/2016, 2:42 PM Pager [phone]  If 7PM-7AM, please contact night-coverage [email or web address] Password [email or web address]

## 2016-06-04 NOTE — Consult Note (Signed)
   [facility] Inpatient Consult   06/04/2016  Tramar Losano [place] [birth year] [id number]   Chart reviewed for re-admission and post hospital needs for his 3rd admission in the past 6 months.  Patient is in the  [place] Registry.  Per chart review this is an  82 y.o.malewith medical history significant of CAD, previous CVA with residual left side deficits, HF, GERD, anemia who presented to the hospital after an episode of fall. Pt is not sure what exactly happened but was on the ground for ~ 10 + hours. This is his 3rd fall in the past week.   Spoke with the inpatient RNCM, Krisit who states the patient will be discharge to a skilled facility for long term needs. No current [facility] needs identified at this time.  For changes or questions, please contact:  Natividad Brood, RN BSN [facility] Liaison  [phone] business mobile phone Toll free office [phone]

## 2016-06-04 NOTE — Progress Notes (Signed)
Clinical Social Worker met with patients son Dr. Acie Fredrickson, Dr. Acie Fredrickson stated that they do not want patient to return to [facility] and would would prefer another facility. Dr.Lavey picked [facility] as a secondary. Patients family aware that patient only has 6 days left on insurance for rehab before patient is in his copay days.  CSW made family aware that patient was accepted to [facility] facility with a private bed offer. Family wants patients to transition to [facility] care after SNF and [facility] has bed available once rehab is finish. CSW spoke to family about insurance coverage for [employer] and encouraged them to apply for Medicaid on patients behalf. Admissions coordinator Sharyn Lull stated facility is ready for patient today  Rhea Pink, MSW,  [employer] [phone]

## 2016-06-05 ENCOUNTER — Encounter: Payer: Self-pay | Admitting: Hematology and Oncology

## 2016-06-05 LAB — GLUCOSE, CAPILLARY
GLUCOSE-CAPILLARY: 141 mg/dL — AB (ref 65–99)
GLUCOSE-CAPILLARY: 181 mg/dL — AB (ref 65–99)
GLUCOSE-CAPILLARY: 179 mg/dL — AB (ref 65–99)
Glucose-Capillary: 250 mg/dL — ABNORMAL HIGH (ref 65–99)

## 2016-06-06 ENCOUNTER — Telehealth: Payer: Self-pay | Admitting: *Deleted

## 2016-06-06 ENCOUNTER — Telehealth: Payer: Self-pay | Admitting: Internal Medicine

## 2016-06-06 DIAGNOSIS — W19XXXA Unspecified fall, initial encounter: Secondary | ICD-10-CM | POA: Diagnosis not present

## 2016-06-06 DIAGNOSIS — E119 Type 2 diabetes mellitus without complications: Secondary | ICD-10-CM | POA: Diagnosis not present

## 2016-06-06 DIAGNOSIS — I2510 Atherosclerotic heart disease of native coronary artery without angina pectoris: Secondary | ICD-10-CM | POA: Diagnosis not present

## 2016-06-06 LAB — GLUCOSE, CAPILLARY
GLUCOSE-CAPILLARY: 140 mg/dL — AB (ref 65–99)
GLUCOSE-CAPILLARY: 252 mg/dL — AB (ref 65–99)
Glucose-Capillary: 267 mg/dL — ABNORMAL HIGH (ref 65–99)
Glucose-Capillary: 153 mg/dL — ABNORMAL HIGH (ref 65–99)

## 2016-06-06 NOTE — Telephone Encounter (Signed)
Patient was discharge from [facility] to [facility]  On 06/04/16 not eligible for TCM patient was admitted from ED due CAP on 06/01/16. Patient has 6 days left on rehab after rehab patient will transfer to SNF at the [facility].

## 2016-06-06 NOTE — Telephone Encounter (Signed)
[facility] [place] to speak to RN taking care of patient.  Spoke with Judeen Hammans, RN there on 05-27-16 regarding labs.  She was to draw labs and fax results to Dr. Mike Gip.  Results never received.  Today spoke with Mikki Santee, who informed me that patient was discharged after midnight last night and he could not give me further information.  Called patient's home and there was no answer.

## 2016-06-07 LAB — BASIC METABOLIC PANEL
Anion gap: 10 (ref 5–15)
BUN: 61 mg/dL — AB (ref 6–20)
CHLORIDE: 94 mmol/L — AB (ref 101–111)
CO2: 24 mmol/L (ref 22–32)
CREATININE: 2.26 mg/dL — AB (ref 0.61–1.24)
Calcium: 8.5 mg/dL — ABNORMAL LOW (ref 8.9–10.3)
GFR calc Af Amer: 29 mL/min — ABNORMAL LOW (ref >60)
GFR calc non Af Amer: 25 mL/min — ABNORMAL LOW (ref >60)
Glucose, Bld: 315 mg/dL — ABNORMAL HIGH (ref 65–99)
POTASSIUM: 4.4 mmol/L (ref 3.5–5.1)
Sodium: 128 mmol/L — ABNORMAL LOW (ref 135–145)

## 2016-06-07 LAB — CBC WITH DIFFERENTIAL/PLATELET
Basophils Absolute: 0.1 10*3/uL (ref 0–0.1)
Basophils Relative: 1 %
Eosinophils Absolute: 0.4 10*3/uL (ref 0–0.7)
Eosinophils Relative: 8 %
HCT: 25 % — ABNORMAL LOW (ref 40.0–52.0)
Hemoglobin: 8.5 g/dL — ABNORMAL LOW (ref 13.0–18.0)
Lymphocytes Relative: 11 %
Lymphs Abs: 0.5 10*3/uL — ABNORMAL LOW (ref 1.0–3.6)
MCH: 28.2 pg (ref 26.0–34.0)
MCHC: 33.9 g/dL (ref 32.0–36.0)
MCV: 83.2 fL (ref 80.0–100.0)
Monocytes Absolute: 0.6 10*3/uL (ref 0.2–1.0)
Monocytes Relative: 12 %
Neutro Abs: 3.3 10*3/uL (ref 1.4–6.5)
Neutrophils Relative %: 68 %
Platelets: 259 10*3/uL (ref 150–440)
RBC: 3 MIL/uL — ABNORMAL LOW (ref 4.40–5.90)
RDW: 15.6 % — ABNORMAL HIGH (ref 11.5–14.5)
WBC: 4.8 10*3/uL (ref 3.8–10.6)

## 2016-06-07 LAB — GLUCOSE, CAPILLARY
Glucose-Capillary: 354 mg/dL — ABNORMAL HIGH (ref 65–99)
Glucose-Capillary: 337 mg/dL — ABNORMAL HIGH (ref 65–99)

## 2016-06-08 LAB — GLUCOSE, CAPILLARY
GLUCOSE-CAPILLARY: 282 mg/dL — AB (ref 65–99)
GLUCOSE-CAPILLARY: 177 mg/dL — AB (ref 65–99)
GLUCOSE-CAPILLARY: 263 mg/dL — AB (ref 65–99)
GLUCOSE-CAPILLARY: 178 mg/dL — AB (ref 65–99)
Glucose-Capillary: 322 mg/dL — ABNORMAL HIGH (ref 65–99)
Glucose-Capillary: 356 mg/dL — ABNORMAL HIGH (ref 65–99)

## 2016-06-09 LAB — GLUCOSE, CAPILLARY
GLUCOSE-CAPILLARY: 129 mg/dL — AB (ref 65–99)
Glucose-Capillary: 79 mg/dL (ref 65–99)

## 2016-06-10 ENCOUNTER — Encounter
Admission: RE | Admit: 2016-06-10 | Discharge: 2016-06-10 | Disposition: A | Discharge status: Home / Self Care | Payer: Medicare Other | Source: Ambulatory Visit | Attending: Internal Medicine | Admitting: Internal Medicine

## 2016-06-10 LAB — CBC WITH DIFFERENTIAL/PLATELET
BASOS PCT: 1 %
Basophils Absolute: 0.1 10*3/uL (ref 0–0.1)
Eosinophils Absolute: 0.3 10*3/uL (ref 0–0.7)
Eosinophils Relative: 6 %
HEMATOCRIT: 25.2 % — AB (ref 40.0–52.0)
HEMOGLOBIN: 8.3 g/dL — AB (ref 13.0–18.0)
LYMPHS ABS: 0.6 10*3/uL — AB (ref 1.0–3.6)
Lymphocytes Relative: 11 %
MCH: 27.8 pg (ref 26.0–34.0)
MCHC: 33 g/dL (ref 32.0–36.0)
MCV: 84 fL (ref 80.0–100.0)
MONOS PCT: 15 %
Monocytes Absolute: 0.8 10*3/uL (ref 0.2–1.0)
NEUTROS ABS: 3.8 10*3/uL (ref 1.4–6.5)
NEUTROS PCT: 67 %
Platelets: 243 10*3/uL (ref 150–440)
RBC: 3 MIL/uL — ABNORMAL LOW (ref 4.40–5.90)
RDW: 15.8 % — ABNORMAL HIGH (ref 11.5–14.5)
WBC: 5.6 10*3/uL (ref 3.8–10.6)

## 2016-06-10 LAB — BASIC METABOLIC PANEL
Anion gap: 8 (ref 5–15)
BUN: 74 mg/dL — ABNORMAL HIGH (ref 6–20)
CHLORIDE: 96 mmol/L — AB (ref 101–111)
CO2: 24 mmol/L (ref 22–32)
CREATININE: 2.15 mg/dL — AB (ref 0.61–1.24)
Calcium: 8.8 mg/dL — ABNORMAL LOW (ref 8.9–10.3)
GFR calc non Af Amer: 26 mL/min — ABNORMAL LOW (ref >60)
GFR, EST AFRICAN AMERICAN: 30 mL/min — AB (ref >60)
Glucose, Bld: 299 mg/dL — ABNORMAL HIGH (ref 65–99)
POTASSIUM: 4.6 mmol/L (ref 3.5–5.1)
Sodium: 128 mmol/L — ABNORMAL LOW (ref 135–145)

## 2016-06-10 LAB — GLUCOSE, CAPILLARY
GLUCOSE-CAPILLARY: 304 mg/dL — AB (ref 65–99)
GLUCOSE-CAPILLARY: 319 mg/dL — AB (ref 65–99)
Glucose-Capillary: 300 mg/dL — ABNORMAL HIGH (ref 65–99)
Glucose-Capillary: 245 mg/dL — ABNORMAL HIGH (ref 65–99)
Glucose-Capillary: 398 mg/dL — ABNORMAL HIGH (ref 65–99)

## 2016-06-11 ENCOUNTER — Non-Acute Institutional Stay (SKILLED_NURSING_FACILITY): Payer: Medicare Other | Admitting: Gerontology

## 2016-06-11 ENCOUNTER — Telehealth: Payer: Self-pay | Admitting: *Deleted

## 2016-06-11 DIAGNOSIS — E871 Hypo-osmolality and hyponatremia: Secondary | ICD-10-CM

## 2016-06-11 DIAGNOSIS — R531 Weakness: Secondary | ICD-10-CM | POA: Diagnosis not present

## 2016-06-11 LAB — GLUCOSE, CAPILLARY
GLUCOSE-CAPILLARY: 290 mg/dL — AB (ref 65–99)
GLUCOSE-CAPILLARY: 275 mg/dL — AB (ref 65–99)
GLUCOSE-CAPILLARY: 200 mg/dL — AB (ref 65–99)
Glucose-Capillary: 280 mg/dL — ABNORMAL HIGH (ref 65–99)
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
## 2016-06-11 NOTE — Telephone Encounter (Signed)
Kathryne Hitch, [employer] of Nursing @ [facility] [employer]  She states the labs for this patient were drawn.  Faxed copies to [facility].  Placed in MD mailbox for review.  Juliann Pulse stated the patient left with his son, Dr. Cleatrice Burke in his private vehicle but is unsure where he went from there.  Son's phone number is [phone].  Attempted to call patient at home again today and still no answer and no availability to leave a message.  Please advise as to where we go from here.

## 2016-06-12 LAB — GLUCOSE, CAPILLARY
GLUCOSE-CAPILLARY: 212 mg/dL — AB (ref 65–99)
GLUCOSE-CAPILLARY: 180 mg/dL — AB (ref 65–99)
GLUCOSE-CAPILLARY: 290 mg/dL — AB (ref 65–99)
Glucose-Capillary: 231 mg/dL — ABNORMAL HIGH (ref 65–99)

## 2016-06-12 NOTE — Telephone Encounter (Signed)
  I do not think I saw any labs from Mr [name].  We can try and call his son.  M

## 2016-06-13 ENCOUNTER — Telehealth: Payer: Self-pay | Admitting: *Deleted

## 2016-06-13 LAB — GLUCOSE, CAPILLARY
GLUCOSE-CAPILLARY: 131 mg/dL — AB (ref 65–99)
GLUCOSE-CAPILLARY: 233 mg/dL — AB (ref 65–99)
Glucose-Capillary: 323 mg/dL — ABNORMAL HIGH (ref 65–99)
Glucose-Capillary: 165 mg/dL — ABNORMAL HIGH (ref 65–99)

## 2016-06-13 NOTE — Telephone Encounter (Signed)
Called patient's son and LVM that Dr. Mike Gip had not heard from patient in awhile and we received lab results from [facility] that she is concerned about.  She would like to know if he is being followed elsewhere or would like a return appointment with Dr. Alvia Grove.  Left call back number and asked for a return call.

## 2016-06-14 LAB — GLUCOSE, CAPILLARY
GLUCOSE-CAPILLARY: 155 mg/dL — AB (ref 65–99)
Glucose-Capillary: 175 mg/dL — ABNORMAL HIGH (ref 65–99)
Glucose-Capillary: 213 mg/dL — ABNORMAL HIGH (ref 65–99)
Glucose-Capillary: 236 mg/dL — ABNORMAL HIGH (ref 65–99)

## 2016-06-15 LAB — GLUCOSE, CAPILLARY
GLUCOSE-CAPILLARY: 105 mg/dL — AB (ref 65–99)
GLUCOSE-CAPILLARY: 211 mg/dL — AB (ref 65–99)
Glucose-Capillary: 287 mg/dL — ABNORMAL HIGH (ref 65–99)
Glucose-Capillary: 318 mg/dL — ABNORMAL HIGH (ref 65–99)

## 2016-06-16 LAB — GLUCOSE, CAPILLARY
Glucose-Capillary: 66 mg/dL (ref 65–99)
Glucose-Capillary: 311 mg/dL — ABNORMAL HIGH (ref 65–99)
Glucose-Capillary: 170 mg/dL — ABNORMAL HIGH (ref 65–99)
Glucose-Capillary: 154 mg/dL — ABNORMAL HIGH (ref 65–99)

## 2016-06-17 LAB — GLUCOSE, CAPILLARY
GLUCOSE-CAPILLARY: 180 mg/dL — AB (ref 65–99)
Glucose-Capillary: 133 mg/dL — ABNORMAL HIGH (ref 65–99)
Glucose-Capillary: 217 mg/dL — ABNORMAL HIGH (ref 65–99)
Glucose-Capillary: 181 mg/dL — ABNORMAL HIGH (ref 65–99)

## 2016-06-18 ENCOUNTER — Non-Acute Institutional Stay (SKILLED_NURSING_FACILITY): Payer: Medicare Other | Admitting: Gerontology

## 2016-06-18 DIAGNOSIS — N183 Chronic kidney disease, stage 3 unspecified: Secondary | ICD-10-CM

## 2016-06-18 DIAGNOSIS — D638 Anemia in other chronic diseases classified elsewhere: Secondary | ICD-10-CM | POA: Diagnosis not present

## 2016-06-18 DIAGNOSIS — R0609 Other forms of dyspnea: Secondary | ICD-10-CM | POA: Diagnosis not present

## 2016-06-18 LAB — CBC WITH DIFFERENTIAL/PLATELET
Basophils Absolute: 0.1 10*3/uL (ref 0–0.1)
Basophils Relative: 1 %
Eosinophils Absolute: 0.6 10*3/uL (ref 0–0.7)
Eosinophils Relative: 10 %
HEMATOCRIT: 23.5 % — AB (ref 40.0–52.0)
Hemoglobin: 7.8 g/dL — ABNORMAL LOW (ref 13.0–18.0)
LYMPHS PCT: 10 %
Lymphs Abs: 0.5 10*3/uL — ABNORMAL LOW (ref 1.0–3.6)
MCH: 27.4 pg (ref 26.0–34.0)
MCHC: 33.1 g/dL (ref 32.0–36.0)
MCV: 83 fL (ref 80.0–100.0)
MONO ABS: 0.8 10*3/uL (ref 0.2–1.0)
MONOS PCT: 14 %
NEUTROS ABS: 3.7 10*3/uL (ref 1.4–6.5)
Neutrophils Relative %: 65 %
Platelets: 236 10*3/uL (ref 150–440)
RBC: 2.83 MIL/uL — ABNORMAL LOW (ref 4.40–5.90)
RDW: 15.7 % — AB (ref 11.5–14.5)
WBC: 5.6 10*3/uL (ref 3.8–10.6)

## 2016-06-18 LAB — GLUCOSE, CAPILLARY
Glucose-Capillary: 162 mg/dL — ABNORMAL HIGH (ref 65–99)
Glucose-Capillary: 175 mg/dL — ABNORMAL HIGH (ref 65–99)
Glucose-Capillary: 112 mg/dL — ABNORMAL HIGH (ref 65–99)
Glucose-Capillary: 325 mg/dL — ABNORMAL HIGH (ref 65–99)

## 2016-06-18 LAB — BASIC METABOLIC PANEL
ANION GAP: 11 (ref 5–15)
BUN: 71 mg/dL — ABNORMAL HIGH (ref 6–20)
CALCIUM: 8.8 mg/dL — AB (ref 8.9–10.3)
CHLORIDE: 100 mmol/L — AB (ref 101–111)
CO2: 23 mmol/L (ref 22–32)
CREATININE: 2.26 mg/dL — AB (ref 0.61–1.24)
GFR calc Af Amer: 29 mL/min — ABNORMAL LOW (ref >60)
GFR calc non Af Amer: 25 mL/min — ABNORMAL LOW (ref >60)
GLUCOSE: 97 mg/dL (ref 65–99)
Potassium: 3.8 mmol/L (ref 3.5–5.1)
Sodium: 134 mmol/L — ABNORMAL LOW (ref 135–145)
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
## 2016-06-19 ENCOUNTER — Other Ambulatory Visit: Payer: Self-pay

## 2016-06-19 ENCOUNTER — Telehealth: Payer: Self-pay | Admitting: *Deleted

## 2016-06-19 ENCOUNTER — Ambulatory Visit
Admission: RE | Admit: 2016-06-19 | Discharge: 2016-06-19 | Disposition: A | Discharge status: Home / Self Care | Payer: Medicare Other | Source: Ambulatory Visit | Attending: Internal Medicine | Admitting: Internal Medicine

## 2016-06-19 ENCOUNTER — Observation Stay: Payer: Medicare Other

## 2016-06-19 ENCOUNTER — Observation Stay
Admission: EM | Admit: 2016-06-19 | Discharge: 2016-06-21 | Disposition: A | Discharge status: Skilled Nursing Facility | Payer: Medicare Other | Attending: Internal Medicine | Admitting: Internal Medicine

## 2016-06-19 ENCOUNTER — Encounter: Payer: Self-pay | Admitting: Emergency Medicine

## 2016-06-19 DIAGNOSIS — F419 Anxiety disorder, unspecified: Secondary | ICD-10-CM | POA: Insufficient documentation

## 2016-06-19 DIAGNOSIS — I350 Nonrheumatic aortic (valve) stenosis: Secondary | ICD-10-CM | POA: Insufficient documentation

## 2016-06-19 DIAGNOSIS — Z951 Presence of aortocoronary bypass graft: Secondary | ICD-10-CM | POA: Insufficient documentation

## 2016-06-19 DIAGNOSIS — E785 Hyperlipidemia, unspecified: Secondary | ICD-10-CM | POA: Insufficient documentation

## 2016-06-19 DIAGNOSIS — M6281 Muscle weakness (generalized): Secondary | ICD-10-CM

## 2016-06-19 DIAGNOSIS — Z888 Allergy status to other drugs, medicaments and biological substances status: Secondary | ICD-10-CM | POA: Insufficient documentation

## 2016-06-19 DIAGNOSIS — M7989 Other specified soft tissue disorders: Secondary | ICD-10-CM

## 2016-06-19 DIAGNOSIS — R197 Diarrhea, unspecified: Secondary | ICD-10-CM | POA: Diagnosis not present

## 2016-06-19 DIAGNOSIS — H3530 Unspecified macular degeneration: Secondary | ICD-10-CM | POA: Insufficient documentation

## 2016-06-19 DIAGNOSIS — M1990 Unspecified osteoarthritis, unspecified site: Secondary | ICD-10-CM | POA: Insufficient documentation

## 2016-06-19 DIAGNOSIS — E11649 Type 2 diabetes mellitus with hypoglycemia without coma: Secondary | ICD-10-CM | POA: Diagnosis not present

## 2016-06-19 DIAGNOSIS — I82502 Chronic embolism and thrombosis of unspecified deep veins of left lower extremity: Secondary | ICD-10-CM | POA: Insufficient documentation

## 2016-06-19 DIAGNOSIS — E162 Hypoglycemia, unspecified: Secondary | ICD-10-CM | POA: Diagnosis not present

## 2016-06-19 DIAGNOSIS — J449 Chronic obstructive pulmonary disease, unspecified: Secondary | ICD-10-CM | POA: Insufficient documentation

## 2016-06-19 DIAGNOSIS — Z8551 Personal history of malignant neoplasm of bladder: Secondary | ICD-10-CM | POA: Insufficient documentation

## 2016-06-19 DIAGNOSIS — F5105 Insomnia due to other mental disorder: Secondary | ICD-10-CM | POA: Insufficient documentation

## 2016-06-19 DIAGNOSIS — D72829 Elevated white blood cell count, unspecified: Secondary | ICD-10-CM

## 2016-06-19 DIAGNOSIS — A0811 Acute gastroenteropathy due to Norwalk agent: Secondary | ICD-10-CM | POA: Diagnosis not present

## 2016-06-19 DIAGNOSIS — I5042 Chronic combined systolic (congestive) and diastolic (congestive) heart failure: Secondary | ICD-10-CM | POA: Diagnosis not present

## 2016-06-19 DIAGNOSIS — I130 Hypertensive heart and chronic kidney disease with heart failure and stage 1 through stage 4 chronic kidney disease, or unspecified chronic kidney disease: Secondary | ICD-10-CM | POA: Diagnosis not present

## 2016-06-19 DIAGNOSIS — E1142 Type 2 diabetes mellitus with diabetic polyneuropathy: Secondary | ICD-10-CM | POA: Diagnosis not present

## 2016-06-19 DIAGNOSIS — I2510 Atherosclerotic heart disease of native coronary artery without angina pectoris: Secondary | ICD-10-CM | POA: Insufficient documentation

## 2016-06-19 DIAGNOSIS — R262 Difficulty in walking, not elsewhere classified: Secondary | ICD-10-CM

## 2016-06-19 DIAGNOSIS — Z880 Allergy status to penicillin: Secondary | ICD-10-CM | POA: Insufficient documentation

## 2016-06-19 DIAGNOSIS — R600 Localized edema: Secondary | ICD-10-CM | POA: Diagnosis not present

## 2016-06-19 DIAGNOSIS — E1122 Type 2 diabetes mellitus with diabetic chronic kidney disease: Secondary | ICD-10-CM | POA: Diagnosis not present

## 2016-06-19 DIAGNOSIS — K529 Noninfective gastroenteritis and colitis, unspecified: Secondary | ICD-10-CM

## 2016-06-19 DIAGNOSIS — R0600 Dyspnea, unspecified: Secondary | ICD-10-CM | POA: Diagnosis not present

## 2016-06-19 DIAGNOSIS — R001 Bradycardia, unspecified: Secondary | ICD-10-CM | POA: Insufficient documentation

## 2016-06-19 DIAGNOSIS — I482 Chronic atrial fibrillation: Secondary | ICD-10-CM | POA: Insufficient documentation

## 2016-06-19 DIAGNOSIS — I441 Atrioventricular block, second degree: Secondary | ICD-10-CM | POA: Insufficient documentation

## 2016-06-19 DIAGNOSIS — L03119 Cellulitis of unspecified part of limb: Secondary | ICD-10-CM

## 2016-06-19 DIAGNOSIS — R296 Repeated falls: Secondary | ICD-10-CM | POA: Insufficient documentation

## 2016-06-19 DIAGNOSIS — I10 Essential (primary) hypertension: Secondary | ICD-10-CM

## 2016-06-19 DIAGNOSIS — D649 Anemia, unspecified: Secondary | ICD-10-CM

## 2016-06-19 DIAGNOSIS — Z9889 Other specified postprocedural states: Secondary | ICD-10-CM | POA: Insufficient documentation

## 2016-06-19 DIAGNOSIS — Z794 Long term (current) use of insulin: Secondary | ICD-10-CM | POA: Insufficient documentation

## 2016-06-19 DIAGNOSIS — Z953 Presence of xenogenic heart valve: Secondary | ICD-10-CM | POA: Insufficient documentation

## 2016-06-19 DIAGNOSIS — N400 Enlarged prostate without lower urinary tract symptoms: Secondary | ICD-10-CM | POA: Insufficient documentation

## 2016-06-19 DIAGNOSIS — N183 Chronic kidney disease, stage 3 (moderate): Secondary | ICD-10-CM | POA: Insufficient documentation

## 2016-06-19 DIAGNOSIS — Z886 Allergy status to analgesic agent status: Secondary | ICD-10-CM | POA: Insufficient documentation

## 2016-06-19 LAB — GASTROINTESTINAL PANEL BY PCR, STOOL (REPLACES STOOL CULTURE)
ADENOVIRUS F40/41: NOT DETECTED
ASTROVIRUS: NOT DETECTED
CAMPYLOBACTER SPECIES: NOT DETECTED
CRYPTOSPORIDIUM: NOT DETECTED
Cyclospora cayetanensis: NOT DETECTED
ENTEROAGGREGATIVE E COLI (EAEC): NOT DETECTED
ENTEROPATHOGENIC E COLI (EPEC): NOT DETECTED
ENTEROTOXIGENIC E COLI (ETEC): NOT DETECTED
Entamoeba histolytica: NOT DETECTED
GIARDIA LAMBLIA: NOT DETECTED
NOROVIRUS GI/GII: DETECTED — AB
PLESIMONAS SHIGELLOIDES: NOT DETECTED
ROTAVIRUS A: NOT DETECTED
Salmonella species: NOT DETECTED
Sapovirus (I, II, IV, and V): NOT DETECTED
Shiga like toxin producing E coli (STEC): NOT DETECTED
Shigella/Enteroinvasive E coli (EIEC): NOT DETECTED
Vibrio cholerae: NOT DETECTED
Vibrio species: NOT DETECTED
YERSINIA ENTEROCOLITICA: NOT DETECTED

## 2016-06-19 LAB — GLUCOSE, CAPILLARY
GLUCOSE-CAPILLARY: 132 mg/dL — AB (ref 65–99)
GLUCOSE-CAPILLARY: 47 mg/dL — AB (ref 65–99)
GLUCOSE-CAPILLARY: 40 mg/dL — AB (ref 65–99)
GLUCOSE-CAPILLARY: 45 mg/dL — AB (ref 65–99)
GLUCOSE-CAPILLARY: 101 mg/dL — AB (ref 65–99)
GLUCOSE-CAPILLARY: 101 mg/dL — AB (ref 65–99)
Glucose-Capillary: 69 mg/dL (ref 65–99)
Glucose-Capillary: 38 mg/dL — CL (ref 65–99)
Glucose-Capillary: 53 mg/dL — ABNORMAL LOW (ref 65–99)
Glucose-Capillary: 44 mg/dL — CL (ref 65–99)
Glucose-Capillary: 125 mg/dL — ABNORMAL HIGH (ref 65–99)

## 2016-06-19 LAB — URINALYSIS, COMPLETE (UACMP) WITH MICROSCOPIC
Bilirubin Urine: NEGATIVE
GLUCOSE, UA: NEGATIVE mg/dL
Hgb urine dipstick: NEGATIVE
Ketones, ur: NEGATIVE mg/dL
Leukocytes, UA: NEGATIVE
Nitrite: NEGATIVE
PH: 5 (ref 5.0–8.0)
PROTEIN: 30 mg/dL — AB
SQUAMOUS EPITHELIAL / LPF: NONE SEEN
Specific Gravity, Urine: 1.009 (ref 1.005–1.030)

## 2016-06-19 LAB — CBC WITH DIFFERENTIAL/PLATELET
Basophils Absolute: 0 10*3/uL (ref 0–0.1)
Basophils Relative: 0 %
EOS ABS: 0.4 10*3/uL (ref 0–0.7)
EOS PCT: 4 %
HCT: 26.6 % — ABNORMAL LOW (ref 40.0–52.0)
Hemoglobin: 9 g/dL — ABNORMAL LOW (ref 13.0–18.0)
LYMPHS ABS: 0.1 10*3/uL — AB (ref 1.0–3.6)
LYMPHS PCT: 1 %
MCH: 28.2 pg (ref 26.0–34.0)
MCHC: 33.7 g/dL (ref 32.0–36.0)
MCV: 83.8 fL (ref 80.0–100.0)
MONO ABS: 0.6 10*3/uL (ref 0.2–1.0)
Monocytes Relative: 6 %
Neutro Abs: 9.6 10*3/uL — ABNORMAL HIGH (ref 1.4–6.5)
Neutrophils Relative %: 89 %
PLATELETS: 252 10*3/uL (ref 150–440)
RBC: 3.18 MIL/uL — AB (ref 4.40–5.90)
RDW: 15.8 % — AB (ref 11.5–14.5)
WBC: 10.8 10*3/uL — AB (ref 3.8–10.6)

## 2016-06-19 LAB — COMPREHENSIVE METABOLIC PANEL
ALBUMIN: 3.6 g/dL (ref 3.5–5.0)
ALT: 14 U/L — AB (ref 17–63)
AST: 24 U/L (ref 15–41)
Alkaline Phosphatase: 119 U/L (ref 38–126)
Anion gap: 10 (ref 5–15)
BILIRUBIN TOTAL: 1 mg/dL (ref 0.3–1.2)
BUN: 69 mg/dL — AB (ref 6–20)
CHLORIDE: 100 mmol/L — AB (ref 101–111)
CO2: 25 mmol/L (ref 22–32)
CREATININE: 2.27 mg/dL — AB (ref 0.61–1.24)
Calcium: 8.9 mg/dL (ref 8.9–10.3)
GFR calc Af Amer: 28 mL/min — ABNORMAL LOW (ref >60)
GFR, EST NON AFRICAN AMERICAN: 24 mL/min — AB (ref >60)
GLUCOSE: 54 mg/dL — AB (ref 65–99)
Potassium: 4 mmol/L (ref 3.5–5.1)
Sodium: 135 mmol/L (ref 135–145)
Total Protein: 6.8 g/dL (ref 6.5–8.1)

## 2016-06-19 LAB — TROPONIN I: Troponin I: <0.03 ng/mL (ref <0.03)

## 2016-06-19 LAB — RAPID INFLUENZA A&B ANTIGENS (ARMC ONLY)
INFLUENZA A (ARMC): NEGATIVE
INFLUENZA B (ARMC): NEGATIVE

## 2016-06-19 LAB — C DIFFICILE QUICK SCREEN W PCR REFLEX
C DIFFICILE (CDIFF) INTERP: NOT DETECTED
C DIFFICILE (CDIFF) TOXIN: NEGATIVE
C Diff antigen: NEGATIVE

## 2016-06-19 LAB — GLUCOSE, RANDOM: GLUCOSE: 50 mg/dL — AB (ref 65–99)

## 2016-06-19 LAB — MAGNESIUM: MAGNESIUM: 2 mg/dL (ref 1.7–2.4)

## 2016-06-19 LAB — PREPARE RBC (CROSSMATCH)

## 2016-06-19 MED ORDER — SODIUM CHLORIDE FLUSH 0.9 % IV SOLN
INTRAVENOUS | Status: AC
  Filled 2016-06-19: qty 10

## 2016-06-19 MED ORDER — DEXTROSE-NACL 5-0.45 % IV SOLN: INTRAVENOUS | Status: DC

## 2016-06-19 MED ORDER — NITROGLYCERIN 2 % TD OINT
1.0000 [in_us] | TOPICAL_OINTMENT | Freq: Four times a day (QID) | TRANSDERMAL | Status: DC
  Administered 2016-06-19 – 2016-06-21 (×8): 1 [in_us] via TOPICAL
  Filled 2016-06-19 (×8): qty 1

## 2016-06-19 MED ORDER — DEXTROSE 50 % IV SOLN
1.0000 | Freq: Once | INTRAVENOUS | Status: AC
  Administered 2016-06-19: 50 mL via INTRAVENOUS
  Filled 2016-06-19: qty 50

## 2016-06-19 MED ORDER — DIPHENHYDRAMINE HCL 25 MG PO TABS
25.0000 mg | ORAL_TABLET | ORAL | Status: DC | PRN
  Filled 2016-06-19: qty 1

## 2016-06-19 MED ORDER — DEXTROSE 5 % IV SOLN: INTRAVENOUS | Status: DC

## 2016-06-19 MED ORDER — SODIUM CHLORIDE 0.9 % IV BOLUS (SEPSIS): 500.0000 mL | Freq: Once | INTRAVENOUS | Status: DC

## 2016-06-19 MED ORDER — GLUCOSE 40 % PO GEL
1.0000 | Freq: Once | ORAL | Status: AC
  Administered 2016-06-19: 37.5 g via ORAL
  Filled 2016-06-19: qty 1

## 2016-06-19 MED ORDER — FUROSEMIDE 10 MG/ML IJ SOLN
40.0000 mg | Freq: Once | INTRAMUSCULAR | Status: AC
Start: 2016-06-19 — End: 2016-06-19
  Administered 2016-06-19: 40 mg via INTRAVENOUS
  Filled 2016-06-19: qty 4

## 2016-06-19 MED ORDER — ONDANSETRON HCL 4 MG/2ML IJ SOLN
4.0000 mg | Freq: Once | INTRAMUSCULAR | Status: AC
  Administered 2016-06-19: 4 mg via INTRAVENOUS
  Filled 2016-06-19: qty 2

## 2016-06-19 MED ORDER — CARVEDILOL 6.25 MG PO TABS
6.2500 mg | ORAL_TABLET | Freq: Two times a day (BID) | ORAL | Status: DC
  Administered 2016-06-19 – 2016-06-21 (×4): 6.25 mg via ORAL
  Filled 2016-06-19 (×3): qty 1

## 2016-06-19 MED ORDER — LOPERAMIDE HCL 2 MG PO CAPS
4.0000 mg | ORAL_CAPSULE | ORAL | Status: DC | PRN
  Administered 2016-06-19: 4 mg via ORAL
  Filled 2016-06-19: qty 2

## 2016-06-19 MED ORDER — ACETAMINOPHEN 325 MG PO TABS
650.0000 mg | ORAL_TABLET | ORAL | Status: DC | PRN
Start: 2016-06-19 — End: 2016-06-20

## 2016-06-19 MED ORDER — CARVEDILOL 6.25 MG PO TABS
ORAL_TABLET | ORAL | Status: AC
  Administered 2016-06-19: 6.25 mg via ORAL
  Filled 2016-06-19: qty 1

## 2016-06-19 MED ORDER — DEXTROSE-NACL 5-0.9 % IV SOLN: INTRAVENOUS | Status: DC

## 2016-06-19 MED ORDER — CARVEDILOL 6.25 MG PO TABS: 6.2500 mg | ORAL_TABLET | Freq: Two times a day (BID) | ORAL | Status: DC

## 2016-06-19 NOTE — OR Nursing (Signed)
Pt continues to be incontinent of very loose stool, foul smelling green color.  Patient cleaned and given fresh linens, pt became weak and nauseated during the process.  o2 99%.  Transferred to ER [id number] with 2 RNs.
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
## 2016-06-19 NOTE — OR Nursing (Signed)
Myself and Phoebe Sharps RN spoke with Dr Toni Arthurs and gave updated report on patient status and blood sugar of 40.  Patient still remains alert and oriented and patient desires to be seen in ER.  Order from Dr Payton Mccallum to transfer patient to ER.

## 2016-06-19 NOTE — ED Notes (Signed)
Pt given orange juice and sandwich tray to attempt to raise blood sugar per MD request.

## 2016-06-19 NOTE — OR Nursing (Signed)
Dr Payton Mccallum notified of patient's blood sugar results of 47 and 53.  Reported that patient was given 8 0z of juice and 4 peanut butter crackers.  Recheck blood sugar of 38 15 mins later.  Patient is awake, alert and oriented and states " I don't feel Low" order for glucose gel.

## 2016-06-19 NOTE — Telephone Encounter (Signed)
Larene Beach NP [facility] [place] called and pt is in their facility for rehab at this time.  Patient was sent to [facility] for 1 unit of PRBC's for HGB of 7.8.  Larene Beach was asking if he needed 2 units of PRBC's.  Discussed with Dr. Mike Gip and she feels that the pt needs 1 unit of PRBC's and to get back on his procrit asap.  Larene Beach informed of pts need for appt at the cancer center for labs, MD and procrit.  Voiced understanding, Larene Beach to discuss with [facility] on seeing if they can get him to his appt and get procrit based on his rehab status.  Larene Beach to return call to cancer center with update.

## 2016-06-19 NOTE — OR Nursing (Signed)
Discussed with Toni Arthurs, NP earlier in morning, the hospital protocals for blood transfusion.  This patient does not qualify for 2 units of blood with a hgb of 7.8. After much discussion, it was decided to give him 1 unit of RBC's, wait one hour and redraw blood work. If he was below 10 for a hgb, then he would qualify for a second unit. That was the plan until 2pm when [facility] made contact via phone. She had discussed with Dr. Mike Gip about the history of this patient and she decided to give him only 1 unit of RBC and they will recheck his blood work tomorrow morning.  I also told her again that he has been having liquid/slight solid pieces of diarrhea several times today. His stomach is upset and he is not hungry but he states that his sugar was low in the morning and he ate many fig newtons.  Patient was able to use bedside commode with several hands for assistance.  In good spirits and feels better overall.

## 2016-06-19 NOTE — ED Triage Notes (Addendum)
Pt comes into the ED from short stay where he was brought in to have 1 unit of blood administered.  Upon trying to discharge the patient they checked his CBG which was 43.  Patient given orange juice, crackers, and tube of dextrose gel and his sugar dropped to 38.  Before transporting here he was 74.  Patient A&Ox4 and in NAD at this time.  Patient is also having diarrhea since earlier today.  Patient in NAD at this time with even and unlabored respirations.

## 2016-06-20 ENCOUNTER — Inpatient Hospital Stay: Payer: Medicare Other

## 2016-06-20 ENCOUNTER — Inpatient Hospital Stay: Payer: Medicare Other | Admitting: Hematology and Oncology

## 2016-06-20 DIAGNOSIS — R0600 Dyspnea, unspecified: Secondary | ICD-10-CM | POA: Diagnosis not present

## 2016-06-20 DIAGNOSIS — E162 Hypoglycemia, unspecified: Secondary | ICD-10-CM | POA: Diagnosis not present

## 2016-06-20 DIAGNOSIS — K529 Noninfective gastroenteritis and colitis, unspecified: Secondary | ICD-10-CM | POA: Diagnosis not present

## 2016-06-20 DIAGNOSIS — A0811 Acute gastroenteropathy due to Norwalk agent: Secondary | ICD-10-CM | POA: Diagnosis not present

## 2016-06-20 LAB — CORTISOL: Cortisol, Plasma: 17.5 ug/dL

## 2016-06-20 LAB — CBC
HCT: 24.7 % — ABNORMAL LOW (ref 40.0–52.0)
HEMOGLOBIN: 8.1 g/dL — AB (ref 13.0–18.0)
MCH: 27.9 pg (ref 26.0–34.0)
MCHC: 32.8 g/dL (ref 32.0–36.0)
MCV: 85.2 fL (ref 80.0–100.0)
Platelets: 215 10*3/uL (ref 150–440)
RBC: 2.9 MIL/uL — ABNORMAL LOW (ref 4.40–5.90)
RDW: 15.8 % — AB (ref 11.5–14.5)
WBC: 7.2 10*3/uL (ref 3.8–10.6)

## 2016-06-20 LAB — BASIC METABOLIC PANEL
ANION GAP: 10 (ref 5–15)
BUN: 65 mg/dL — AB (ref 6–20)
CALCIUM: 8.4 mg/dL — AB (ref 8.9–10.3)
CO2: 24 mmol/L (ref 22–32)
CREATININE: 2.26 mg/dL — AB (ref 0.61–1.24)
Chloride: 101 mmol/L (ref 101–111)
GFR calc Af Amer: 29 mL/min — ABNORMAL LOW (ref >60)
GFR, EST NON AFRICAN AMERICAN: 25 mL/min — AB (ref >60)
GLUCOSE: 172 mg/dL — AB (ref 65–99)
Potassium: 4 mmol/L (ref 3.5–5.1)
Sodium: 135 mmol/L (ref 135–145)

## 2016-06-20 LAB — POCT CBG MONITORING
POCT GLUCOSE (MANUAL ENTRY) KUC: 173 mg/dL — AB (ref 70–99)
POCT GLUCOSE (MANUAL ENTRY) KUC: 166 mg/dL — AB (ref 70–99)
POCT Glucose (KUC): 158 mg/dL — AB (ref 70–99)
POCT Glucose (KUC): 172 mg/dL — AB (ref 70–99)
POCT Glucose (KUC): 239 mg/dL — AB (ref 70–99)

## 2016-06-20 LAB — GLUCOSE, CAPILLARY
GLUCOSE-CAPILLARY: 156 mg/dL — AB (ref 65–99)
GLUCOSE-CAPILLARY: 173 mg/dL — AB (ref 65–99)
GLUCOSE-CAPILLARY: 194 mg/dL — AB (ref 65–99)
Glucose-Capillary: 158 mg/dL — ABNORMAL HIGH (ref 65–99)
Glucose-Capillary: 172 mg/dL — ABNORMAL HIGH (ref 65–99)
Glucose-Capillary: 166 mg/dL — ABNORMAL HIGH (ref 65–99)
Glucose-Capillary: 206 mg/dL — ABNORMAL HIGH (ref 65–99)

## 2016-06-20 LAB — MAGNESIUM: Magnesium: 1.9 mg/dL (ref 1.7–2.4)

## 2016-06-20 LAB — TYPE AND SCREEN
ABO/RH(D): O POS
Antibody Screen: NEGATIVE
Unit division: 0

## 2016-06-20 LAB — MRSA PCR SCREENING: MRSA BY PCR: NEGATIVE

## 2016-06-20 MED ORDER — OCUVITE-LUTEIN PO CAPS
1.0000 | ORAL_CAPSULE | Freq: Two times a day (BID) | ORAL | Status: DC
  Administered 2016-06-20 – 2016-06-21 (×4): 1 via ORAL
  Filled 2016-06-20 (×5): qty 1

## 2016-06-20 MED ORDER — ACETAMINOPHEN 325 MG PO TABS: 650.0000 mg | ORAL_TABLET | Freq: Four times a day (QID) | ORAL | Status: DC | PRN

## 2016-06-20 MED ORDER — ISOSORBIDE MONONITRATE ER 60 MG PO TB24
30.0000 mg | ORAL_TABLET | Freq: Every day | ORAL | Status: DC
  Administered 2016-06-20 – 2016-06-21 (×2): 30 mg via ORAL
  Filled 2016-06-20 (×2): qty 1

## 2016-06-20 MED ORDER — ONDANSETRON HCL 4 MG PO TABS: 4.0000 mg | ORAL_TABLET | Freq: Four times a day (QID) | ORAL | Status: DC | PRN

## 2016-06-20 MED ORDER — HYDROXYCHLOROQUINE SULFATE 200 MG PO TABS
200.0000 mg | ORAL_TABLET | Freq: Every day | ORAL | Status: DC
  Administered 2016-06-20 – 2016-06-21 (×2): 200 mg via ORAL
  Filled 2016-06-20 (×2): qty 1

## 2016-06-20 MED ORDER — TAMSULOSIN HCL 0.4 MG PO CAPS
0.4000 mg | ORAL_CAPSULE | Freq: Every day | ORAL | Status: DC
  Administered 2016-06-20: 0.4 mg via ORAL
  Filled 2016-06-20: qty 1

## 2016-06-20 MED ORDER — KETOTIFEN FUMARATE 0.025 % OP SOLN
1.0000 [drp] | Freq: Two times a day (BID) | OPHTHALMIC | Status: DC
  Administered 2016-06-20 – 2016-06-21 (×4): 1 [drp] via OPHTHALMIC
  Filled 2016-06-20: qty 5

## 2016-06-20 MED ORDER — ONDANSETRON HCL 4 MG/2ML IJ SOLN: 4.0000 mg | Freq: Four times a day (QID) | INTRAMUSCULAR | Status: DC | PRN

## 2016-06-20 MED ORDER — ASPIRIN EC 81 MG PO TBEC
81.0000 mg | DELAYED_RELEASE_TABLET | Freq: Every day | ORAL | Status: DC
  Administered 2016-06-20 – 2016-06-21 (×2): 81 mg via ORAL
  Filled 2016-06-20 (×2): qty 1

## 2016-06-20 MED ORDER — ACETAMINOPHEN 650 MG RE SUPP: 650.0000 mg | Freq: Four times a day (QID) | RECTAL | Status: DC | PRN

## 2016-06-20 MED ORDER — PANTOPRAZOLE SODIUM 40 MG PO TBEC
40.0000 mg | DELAYED_RELEASE_TABLET | Freq: Every day | ORAL | Status: DC
  Administered 2016-06-20 – 2016-06-21 (×2): 40 mg via ORAL
  Filled 2016-06-20 (×2): qty 1

## 2016-06-20 MED ORDER — CALCITRIOL 0.25 MCG PO CAPS
0.2500 ug | ORAL_CAPSULE | Freq: Every day | ORAL | Status: DC
  Administered 2016-06-20 – 2016-06-21 (×2): 0.25 ug via ORAL
  Filled 2016-06-20 (×2): qty 1

## 2016-06-20 MED ORDER — INSULIN ASPART 100 UNIT/ML ~~LOC~~ SOLN
0.0000 [IU] | Freq: Every day | SUBCUTANEOUS | Status: DC
  Administered 2016-06-20: 21:00:00 2 [IU] via SUBCUTANEOUS
  Filled 2016-06-20: qty 2

## 2016-06-20 MED ORDER — INSULIN ASPART 100 UNIT/ML ~~LOC~~ SOLN
0.0000 [IU] | Freq: Three times a day (TID) | SUBCUTANEOUS | Status: DC
  Administered 2016-06-20 (×2): 3 [IU] via SUBCUTANEOUS
  Administered 2016-06-21: 9 [IU] via SUBCUTANEOUS
  Administered 2016-06-21: 7 [IU] via SUBCUTANEOUS
  Filled 2016-06-20: qty 3
  Filled 2016-06-20: qty 9
  Filled 2016-06-20: qty 7
  Filled 2016-06-20: qty 3

## 2016-06-20 MED ORDER — TIOTROPIUM BROMIDE MONOHYDRATE 18 MCG IN CAPS
18.0000 ug | ORAL_CAPSULE | Freq: Every day | RESPIRATORY_TRACT | Status: DC
  Administered 2016-06-21: 08:00:00 18 ug via RESPIRATORY_TRACT
  Filled 2016-06-20 (×2): qty 5

## 2016-06-20 MED ORDER — FUROSEMIDE 40 MG PO TABS
40.0000 mg | ORAL_TABLET | Freq: Every day | ORAL | Status: DC
  Administered 2016-06-20 – 2016-06-21 (×2): 40 mg via ORAL
  Filled 2016-06-20 (×2): qty 1

## 2016-06-20 MED ORDER — LORATADINE 10 MG PO TABS
10.0000 mg | ORAL_TABLET | Freq: Every day | ORAL | Status: DC
  Administered 2016-06-20 – 2016-06-21 (×2): 10 mg via ORAL
  Filled 2016-06-20 (×2): qty 1

## 2016-06-20 MED ORDER — SODIUM CHLORIDE 0.9% FLUSH
3.0000 mL | Freq: Two times a day (BID) | INTRAVENOUS | Status: DC
  Administered 2016-06-20 – 2016-06-21 (×4): 3 mL via INTRAVENOUS

## 2016-06-20 NOTE — Progress Notes (Signed)
[facility] [name]    MR#:  [id number]  DATE OF BIRTH:  11-Oct-1927  SUBJECTIVE:  CHIEF COMPLAINT:   Chief Complaint  Patient presents with  . Hypoglycemia  sleepy, blood sugars better. No more n/v/diarrhea, wants ice water as he's thirsty, had 5 beat run of NSVT earlier today REVIEW OF SYSTEMS:  Review of Systems  Constitutional: Positive for malaise/fatigue. Negative for chills, fever and weight loss.  HENT: Negative for nosebleeds and sore throat.   Eyes: Negative for blurred vision.  Respiratory: Negative for cough, shortness of breath and wheezing.   Cardiovascular: Negative for chest pain, orthopnea, leg swelling and PND.  Gastrointestinal: Positive for diarrhea, nausea and vomiting. Negative for abdominal pain, constipation and heartburn.  Genitourinary: Negative for dysuria and urgency.  Musculoskeletal: Negative for back pain.  Skin: Negative for rash.  Neurological: Positive for weakness. Negative for dizziness, speech change, focal weakness and headaches.  Endo/Heme/Allergies: Does not bruise/bleed easily.  Psychiatric/Behavioral: Negative for depression.    DRUG ALLERGIES:   Allergies  Allergen Reactions  . Asa [Aspirin] Other (See Comments)    Reaction:  Caused stomach ulcer patient can take 81 mg.  . Losartan Other (See Comments)    Decreased pulse rate  . Metoprolol Other (See Comments)    Reaction:  Bradycardia   . Penicillins Swelling and Other (See Comments)    Has patient had a PCN reaction causing immediate rash, facial/tongue/throat swelling, SOB or lightheadedness with hypotension: No Has patient had a PCN reaction causing severe rash involving mucus membranes or skin necrosis: No Has patient had a PCN reaction that required hospitalization No Has patient had a PCN reaction occurring within the last 10 years: No If all of the above answers are "NO", then may proceed with Cephalosporin  use.   VITALS:  Blood pressure (!) 154/63, pulse 66, temperature 98.2 F (36.8 C), temperature source Oral, resp. rate (!) 22, height 5\' 10"  (1.778 m), weight 112.2 kg (247 lb 4.8 oz), SpO2 97 %. PHYSICAL EXAMINATION:  Physical Exam  Constitutional: He is oriented to person, place, and time and well-developed, well-nourished, and in no distress.  HENT:  Head: Normocephalic and atraumatic.  Eyes: Conjunctivae and EOM are normal. Pupils are equal, round, and reactive to light.  Neck: Normal range of motion. Neck supple. No tracheal deviation present. No thyromegaly present.  Cardiovascular: Normal rate, regular rhythm and normal heart sounds.   Pulmonary/Chest: Effort normal and breath sounds normal. No respiratory distress. He has no wheezes. He exhibits no tenderness.  Abdominal: Soft. Bowel sounds are normal. He exhibits no distension. There is no tenderness.  Musculoskeletal: Normal range of motion.  Neurological: He is alert and oriented to person, place, and time. No cranial nerve deficit.  Skin: Skin is warm and dry. No rash noted.  Psychiatric: Mood and affect normal.   LABORATORY PANEL:   CBC  Recent Labs Lab 06/20/16 0509  WBC 7.2  HGB 8.1*  HCT 24.7*  PLT 215   ------------------------------------------------------------------------------------------------------------------ Chemistries   Recent Labs Lab 06/19/16 1742 06/20/16 0509  NA 135 135  K 4.0 4.0  CL 100* 101  CO2 25 24  GLUCOSE 54* 172*  BUN 69* 65*  CREATININE 2.27* 2.26*  CALCIUM 8.9 8.4*  MG 2.0  --   AST 24  --   ALT 14*  --   ALKPHOS 119  --   BILITOT 1.0  --    RADIOLOGY:  US Venous Img Lower Bilateral  Result Date: 06/19/2016 CLINICAL DATA:  Bilateral lower extremity edema history of bladder cancer EXAM: BILATERAL LOWER EXTREMITY VENOUS DOPPLER ULTRASOUND TECHNIQUE: Gray-scale sonography with graded compression, as well as color Doppler and duplex ultrasound were performed to evaluate  the lower extremity deep venous systems from the level of the common femoral vein and including the common femoral, femoral, profunda femoral, popliteal and calf veins including the posterior tibial, peroneal and gastrocnemius veins when visible. The superficial great saphenous vein was also interrogated. Spectral Doppler was utilized to evaluate flow at rest and with distal augmentation maneuvers in the common femoral, femoral and popliteal veins. COMPARISON:  02/05/2016 FINDINGS: RIGHT LOWER EXTREMITY Common Femoral Vein: No evidence of acute thrombus. Normal compressibility, respiratory phasicity and response to augmentation. Mild echogenic wall thickening. Saphenofemoral Junction: No evidence of thrombus. Normal compressibility and flow on color Doppler imaging. Profunda Femoral Vein: No evidence of thrombus. Normal compressibility and flow on color Doppler imaging. Femoral Vein: No evidence of thrombus. Normal compressibility, respiratory phasicity and response to augmentation. Popliteal Vein: No evidence of thrombus. Normal compressibility, respiratory phasicity and response to augmentation. Calf Veins: No evidence of thrombus. Normal compressibility and flow on color Doppler imaging. Venous Reflux:  None. Other Findings:  None. LEFT LOWER EXTREMITY Common Femoral Vein: Mild echogenic wall thickening as noted on comparison study could relate to sequela of chronic DVT. Saphenofemoral Junction: No evidence of thrombus. Normal compressibility and flow on color Doppler imaging. Profunda Femoral Vein: No evidence of thrombus. Normal compressibility and flow on color Doppler imaging. Femoral Vein: No evidence of thrombus. Normal compressibility, respiratory phasicity and response to augmentation. Popliteal Vein: No evidence of thrombus. Normal compressibility, respiratory phasicity and response to augmentation. Calf Veins: Nonvisualization of corona healed veins due to edema Venous Reflux:  None. Other Findings:   None. IMPRESSION: 1. No sonographic evidence for acute DVT within the bilateral lower extremities. 2. Echogenic wall thickening of the common femoral veins (stable on the left) possibly related to chronic DVT, this finding is unchanged. Electronically Signed   By: Donavan Foil M.D.   On: 06/19/2016 20:46   ASSESSMENT AND PLAN:  83 y.o. 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 Acute Norovirus infection: causing N/V/Diarrhea - symptomatic mgmt - isolation - restart diet now as he is able to eat and not having n/v/diarrhea  #2. Hypoglycemia: Resolved now - due to unable to eat from n/v/diarrhea - will resume diet today as symptoms better and he has appetite - blood sugars much better. - start low dose SSI  #3. Malignant essential hypertension - resolved, BP improving but still high - continue patient on Coreg, and Imdur - restart lasix now (has significant LE edema)  #4. Lower extremity swelling, known the left lower extremity chronic DVT. Repeat Doppler ultrasound neg. Shows chronic DVT - Given one dose of IV Lasix on admission, will start him on 40 PO daily at this time (home dose of 80 once a day - can D/C him on home dose)  #5 Non sustained V tech - continue coreg - monitor on tele - Keep close  to 4 and Mg close to 2   All the records are reviewed and case discussed with Nursing, Patient's son on phone and daughter in law at bedside. Management plans discussed with the patient, family and they are in agreement.     All the  records are reviewed and case discussed with Care Management/Social Worker. Management plans discussed with the patient, family and they are in agreement.  CODE STATUS: Partial Code (as listed)  TOTAL TIME TAKING CARE OF THIS PATIENT: 35 minutes.   More than 50% of the time was spent in counseling/coordination of care: YES  POSSIBLE D/C IN 1-2 DAYS, DEPENDING ON CLINICAL CONDITION.   Max Sane M.D on 06/20/2016 at 9:28 AM  Between 7am to 6pm - Pager - [phone]  After 6pm go to [email or web address] - Proofreader  [facility]  Office  [phone]  CC: Primary care physician; [name], MD  Note: This dictation was prepared with Dragon dictation along with smaller phrase technology. Any transcriptional errors that result from this process are unintentional.

## 2016-06-20 NOTE — Progress Notes (Signed)
Inpatient Diabetes Program Recommendations  AACE/ADA: New Consensus Statement on Inpatient Glycemic Control (2015)  Target Ranges:  Prepandial:   less than 140 mg/dL      Peak postprandial:   less than 180 mg/dL (1-2 hours)      Critically ill patients:  140 - 180 mg/dL   Lab Results  Component Value Date   GLUCAP 206 (H) 06/20/2016   HGBA1C 6.6 (H) 05/02/2016    Review of Glycemic Control  Results for EVENS, SODERBERG (MRN [id number]) as of 06/20/2016 15:32  Ref. Range 06/20/2016 04:36 06/20/2016 06:36 06/20/2016 07:30 06/20/2016 11:57 06/20/2016 13:35  Glucose-Capillary Latest Ref Range: 65 - 99 mg/dL 172 (H) 166 (H) 156 (H) 194 (H) 206 (H)    Diabetes history: Type 2 Outpatient Diabetes medications: Lantus 35 units qday, Novolog 5-7 units tid Current orders for Inpatient glycemic control: Novolog 0-9 units tid, Novolog 0-5 units qhs  Inpatient Diabetes Program Recommendations:   Agree with current medications for blood sugar management.    Gentry Fitz, RN, BA, MHA, CDE Diabetes Coordinator Inpatient Diabetes Program  [phone] (Team Pager) [phone] ([place]) 06/20/2016 3:33 PM

## 2016-06-21 DIAGNOSIS — M7989 Other specified soft tissue disorders: Secondary | ICD-10-CM | POA: Diagnosis not present

## 2016-06-21 DIAGNOSIS — A0811 Acute gastroenteropathy due to Norwalk agent: Secondary | ICD-10-CM | POA: Diagnosis not present

## 2016-06-21 DIAGNOSIS — E162 Hypoglycemia, unspecified: Secondary | ICD-10-CM | POA: Diagnosis not present

## 2016-06-21 DIAGNOSIS — K529 Noninfective gastroenteritis and colitis, unspecified: Secondary | ICD-10-CM | POA: Diagnosis not present

## 2016-06-21 LAB — GLUCOSE, CAPILLARY
GLUCOSE-CAPILLARY: 239 mg/dL — AB (ref 65–99)
Glucose-Capillary: 222 mg/dL — ABNORMAL HIGH (ref 65–99)
Glucose-Capillary: 351 mg/dL — ABNORMAL HIGH (ref 65–99)
Glucose-Capillary: 326 mg/dL — ABNORMAL HIGH (ref 65–99)

## 2016-06-21 MED ORDER — INSULIN GLARGINE 100 UNIT/ML ~~LOC~~ SOLN
15.0000 [IU] | Freq: Once | SUBCUTANEOUS | Status: AC
  Administered 2016-06-21: 12:00:00 15 [IU] via SUBCUTANEOUS
  Filled 2016-06-21: qty 0.15

## 2016-06-21 MED ORDER — INSULIN GLARGINE 100 UNIT/ML ~~LOC~~ SOLN: 215.0000 [IU] | Freq: Once | SUBCUTANEOUS | Status: DC

## 2016-06-21 MED ORDER — INSULIN GLARGINE 100 UNIT/ML ~~LOC~~ SOLN
10.0000 [IU] | Freq: Every day | SUBCUTANEOUS | Status: DC
  Filled 2016-06-21: qty 0.1

## 2016-06-21 MED ORDER — INSULIN GLARGINE 100 UNIT/ML ~~LOC~~ SOLN: 35.0000 [IU] | Freq: Every day | SUBCUTANEOUS | 11 refills | Status: DC

## 2016-06-21 NOTE — Clinical Social Work Note (Signed)
Clinical Social Work Assessment  Patient Details  [name] MRN: [id number] Date of Birth: 16-Sep-1927  Date of referral:  06/21/16               Reason for consult:  Other (Comment Required) (from [name] )                Permission sought to share information with:  [name] granted to share information::  Yes, Verbal Permission Granted  Name::      [name]::   [name]   Relationship::     Contact Information:     Housing/Transportation Living arrangements for the past 2 months:  [name], [name] of Information:  Patient, Adult Children Patient Interpreter Needed:  None Criminal Activity/Legal Involvement Pertinent to Current Situation/Hospitalization:  No - Comment as needed Significant Relationships:  Adult Children Lives with:  Facility Resident Do you feel safe going back to the place where you live?  Yes Need for family participation in patient care:  Yes (Comment)  Care giving concerns:  Patient is a short term rehab resident at [name].    Social Worker assessment / plan: Holiday representative (CSW) received SNF consult. Per [name] admissions coordinator at [name] patient is a short term rehab resident and can return when stable. CSW met with patient and his caregiver [name] was at bedside. Patient and caregiver are agreeable for patient to return to [name]. Per patient his son [name] is a cardiologist at [name].  Patient is ready for D/C to [name] today. Per [name] admissions coordinator at [name] patient will go to room 350. RN will call report at [phone] and arrange EMS for transport. CSW sent D/C orders to [name] via [name]. Patient, caregiver [name] and son [name] are aware of above. Please reconsult if future social work needs arise. CSW signing off.   Employment status:  Retired Forensic scientist:   Medicare PT Recommendations:  [name] / Referral to community resources:  [name]  Patient/Family's Response to care: Patient and family are agreeable for patient to return to [name] today.   Patient/Family's Understanding of and Emotional Response to Diagnosis, Current Treatment, and Prognosis:  Patient and family were very pleasant and thanked CSW for visit.   Emotional Assessment Appearance:  Appears stated age Attitude/Demeanor/Rapport:    Affect (typically observed):  Accepting, Adaptable, Pleasant Orientation:  Oriented to Self, Oriented to Place, Oriented to  Time, Oriented to Situation Alcohol / Substance use:  Not Applicable Psych involvement (Current and /or in the community):  No (Comment)  Discharge Needs  Concerns to be addressed:  Discharge Planning Concerns Readmission within the last 30 days:  Yes Current discharge risk:  None Barriers to Discharge:  No Barriers Identified   [name], Kenneth Beets, LCSW 06/21/2016, 2:30 PM

## 2016-06-21 NOTE — NC FL2 (Signed)
[place] LEVEL OF CARE SCREENING TOOL     IDENTIFICATION  Patient [name] Birthdate: 04-Nov-1927 Sex: male Admission Date (Current Location): 06/19/2016  [place] and [place] Number:  Engineering geologist and Address:  [facility], [address], [place], [place] [address]      Provider Number: [id number]  Attending Physician Name and Address:  Nicholes Mango, MD  Relative Name and Phone Number:       Current Level of Care: Hospital Recommended Level of Care: [place] Prior Approval Number:    Date Approved/Denied:   PASRR Number:  ([id number] )  Discharge Plan: SNF    Current Diagnoses: Patient Active Problem List   Diagnosis Date Noted  . Acute gastroenteritis 06/19/2016  . Leukocytosis 06/19/2016  . Anemia 06/19/2016  . Hypoglycemia 06/19/2016  . Malignant essential hypertension 06/19/2016  . Swelling of lower extremity 06/19/2016  . Lower extremity cellulitis 06/19/2016  . Frequent falls 06/01/2016  . Fall 06/01/2016  . Muscle weakness (generalized)   . COPD (chronic obstructive pulmonary disease) ([place])   . Anemia of chronic disease   . Upper GI bleed 09/19/2015  . Minimal cerumen bilateral ear canals 08/18/2015  . Persistent atrial fibrillation ([place])   . Insomnia secondary to anxiety 07/19/2015  . Chronic diastolic heart failure ([place]) 04/20/2015  . Macular degeneration, dry 01/20/2015  . Arthritis 01/20/2015  . 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 Diabetic polyneuropathy associated with type 2 diabetes mellitus ([place]) 08/05/2014  . CKD (chronic kidney disease) stage 3, GFR 30-59 ml/min 07/21/2014  . Hypertensive heart disease with chronic systolic congestive heart failure ([place]) 06/30/2014  . CVA (cerebral infarction) 06/29/2014  . Anemia in chronic kidney disease 11/10/2013  . Mobitz type 1 second degree atrioventricular block 10/01/2012  . S/P AVR 03/24/2012  .  Aortic valve stenosis   . Junctional bradycardia   . Hyperlipidemia   . Angiodysplasia of stomach   . CAD (coronary artery disease)     Orientation RESPIRATION BLADDER Height & Weight     Self, Time, Situation, Place  Normal Continent Weight: 247 lb 4.8 oz (112.2 kg) Height:  5\' 10"  (177.8 cm)  BEHAVIORAL SYMPTOMS/MOOD NEUROLOGICAL BOWEL NUTRITION STATUS   (none)  (none) Continent Diet (Regular Diet )  AMBULATORY STATUS COMMUNICATION OF NEEDS Skin   Extensive Assist Verbally                         Personal Care Assistance Level of Assistance  Bathing, Feeding, Dressing Bathing Assistance: Limited assistance Feeding assistance: Independent Dressing Assistance: Limited assistance     Functional Limitations Info  Sight, Hearing, Speech Sight Info: Adequate Hearing Info: Impaired Speech Info: Adequate    SPECIAL CARE FACTORS FREQUENCY  PT (By licensed PT), OT (By licensed OT)     PT Frequency:  (5) OT Frequency:  (5)            Contractures      Additional Factors Info  Code Status, Allergies, Insulin Sliding Scale, Isolation Precautions Code Status Info:  (Partial ) Allergies Info:  (Asa Aspirin, Losartan, Metoprolol, Penicillins)   Insulin Sliding Scale Info:  (NovoLog Insulin Injections ) Isolation Precautions Info:  (C-diff negative and flu negative. )     Current Medications (06/21/2016):  This is the current hospital active medication list Current Facility-Administered Medications  Medication Dose Route Frequency Provider Last Rate Last Dose  . acetaminophen (TYLENOL) tablet  650 mg  650 mg Oral Q6H PRN Theodoro Grist, MD       Or  . acetaminophen (TYLENOL) suppository 650 mg  650 mg Rectal Q6H PRN Theodoro Grist, MD      . aspirin EC tablet 81 mg  81 mg Oral Daily Max Sane, MD   81 mg at 06/21/16 0815  . calcitRIOL (ROCALTROL) capsule 0.25 mcg  0.25 mcg Oral Daily Theodoro Grist, MD   0.25 mcg at 06/21/16 0815  . carvedilol (COREG) tablet 6.25 mg   6.25 mg Oral BID Theodoro Grist, MD   6.25 mg at 06/21/16 0815  . furosemide (LASIX) tablet 40 mg  40 mg Oral Daily Max Sane, MD   40 mg at 06/21/16 0815  . hydroxychloroquine (PLAQUENIL) tablet 200 mg  200 mg Oral Daily Theodoro Grist, MD   200 mg at 06/21/16 0815  . insulin aspart (novoLOG) injection 0-5 Units  0-5 Units Subcutaneous QHS Max Sane, MD   2 Units at 06/20/16 2126  . insulin aspart (novoLOG) injection 0-9 Units  0-9 Units Subcutaneous TID WC Max Sane, MD   9 Units at 06/21/16 0813  . insulin glargine (LANTUS) injection 15 Units  15 Units Subcutaneous Once Nicholes Mango, MD      . isosorbide mononitrate (IMDUR) 24 hr tablet 30 mg  30 mg Oral Daily Theodoro Grist, MD   30 mg at 06/21/16 0814  . ketotifen (ZADITOR) 0.025 % ophthalmic solution 1 drop  1 drop Right Eye BID Theodoro Grist, MD   1 drop at 06/21/16 0816  . loratadine (CLARITIN) tablet 10 mg  10 mg Oral Daily Theodoro Grist, MD   10 mg at 06/21/16 0815  . multivitamin-lutein (OCUVITE-LUTEIN) capsule 1 capsule  1 capsule Oral BID Theodoro Grist, MD   1 capsule at 06/21/16 0814  . nitroGLYCERIN (NITROGLYN) 2 % ointment 1 inch  1 inch Topical Q6H Theodoro Grist, MD   1 inch at 06/21/16 0539  . ondansetron (ZOFRAN) tablet 4 mg  4 mg Oral Q6H PRN Theodoro Grist, MD       Or  . ondansetron (ZOFRAN) injection 4 mg  4 mg Intravenous Q6H PRN Theodoro Grist, MD      . pantoprazole (PROTONIX) EC tablet 40 mg  40 mg Oral Daily Theodoro Grist, MD   40 mg at 06/21/16 0815  . sodium chloride flush (NS) 0.9 % injection 3 mL  3 mL Intravenous Q12H Theodoro Grist, MD   3 mL at 06/21/16 1000  . tamsulosin (FLOMAX) capsule 0.4 mg  0.4 mg Oral QPC supper Theodoro Grist, MD   0.4 mg at 06/20/16 1743  . tiotropium (SPIRIVA) inhalation capsule 18 mcg  18 mcg Inhalation Daily Max Sane, MD   18 mcg at 06/21/16 0813     Discharge Medications: Please see discharge summary for a list of discharge medications.  Relevant Imaging Results:  Relevant Lab  Results:   Additional Information  (SSN: [id number])  Sample, [name], LCSW

## 2016-06-21 NOTE — Evaluation (Signed)
Physical Therapy Evaluation Patient Details [name] MRN: [id number] DOB: March 23, 1928 Today's Date: 06/21/2016   History of Present Illness  Pt is a 83 y.o. 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 Apparently patient was doing well up until day 1 of admission when he started having nausea, vomiting and diarrhea and diffuse abdominal pain, tenesmus just prior to defecation, initially stool was mushy not became watery. He had at least 5 episodes of diarrheal stool. He was not able to eat or drink and his blood glucose levels plummeted down to 40s. He was brought to emergency room for further evaluation and treatment where he was given D50 solution, however, the patient continues to be tachypneic and feels like his lungs are wheezy. Hospitalist services were contacted for admission with diagnosis of acute Norovirus infection and hypoglycemia.    Clinical Impression  Pt presents with deficits in strength, transfers, mobility, gait, balance, and activity tolerance.  Pt was SBA with bed mobility with extra time/effort required.  Pt required CGA with transfers with cues for proper sequencing.  Pt able to ambulate 30' with RW and CGA with slow cadence and flexed trunk but no LOB.  Pt will benefit from PT services to address above deficits for decreased caregiver assistance upon discharge.      Follow Up Recommendations SNF    Equipment Recommendations  None recommended by PT    Recommendations for Other  Services       Precautions / Restrictions Precautions Precautions: Fall Restrictions Weight Bearing Restrictions: No      Mobility  Bed Mobility Overal bed mobility: Needs Assistance Bed Mobility: Supine to Sit;Sit to Supine     Supine to sit: Supervision Sit to supine: Supervision   General bed mobility comments: Extra time and effort with bed mobility tasks with use of rail  Transfers Overall transfer level: Needs assistance Equipment used: Rolling walker (2 wheeled) Transfers: Sit to/from Stand Sit to Stand: Min guard         General transfer comment: Min verbal cues for sequencing for safety with transfers  Ambulation/Gait Ambulation/Gait assistance: Min guard Ambulation Distance (Feet): 30 Feet Assistive device: Rolling walker (2 wheeled) Gait Pattern/deviations: Decreased step length - right;Trunk flexed   Gait velocity interpretation: Below normal speed for age/gender    Stairs            Wheelchair Mobility    Modified Rankin (Stroke Patients Only)       Balance Overall balance assessment: Needs assistance Sitting-balance support: No upper extremity supported Sitting balance-[name]: Good     Standing balance support: No upper extremity supported Standing balance-[name]: Fair                               Pertinent Vitals/Pain Pain Assessment: No/denies pain    Home Living Family/patient expects to be discharged to:: Private residence Living Arrangements: Alone Available Help at Discharge: Personal care attendant Type of Home: House Home Access: Stairs to enter Entrance Stairs-Rails: Psychiatric nurse of Steps: 2 Home Layout:  Two level Home Equipment: Walker - 2 wheels;Walker - 4 wheels Additional Comments: Pt admitted to acute care hospital from [facility] with expectation to dicharge back to [facility] for short term rehab    Prior Function Level of Independence: Independent with assistive device(s)       ADL's / Homemaking Assistance Needed: Mod I with amb limited community distances with RW indoors and rollator outdoors; 7 falls in last year.        Hand Dominance   Dominant Hand: Right    Extremity/Trunk Assessment        Lower Extremity Assessment Lower Extremity Assessment: Generalized weakness       Communication   Communication: No difficulties  Cognition Arousal/Alertness: Awake/alert Behavior During Therapy: WFL for tasks assessed/performed Overall Cognitive Status: Within Functional Limits for tasks assessed                      General Comments      Exercises Total Joint Exercises Ankle Circles/Pumps: AROM;Both;10 reps Quad Sets: AROM;Both;10 reps Gluteal Sets: AROM;Both;10 reps Long Arc Quad: Strengthening;Both;5 reps Knee Flexion: Strengthening;Both;5 reps Other Exercises Other Exercises: Static standing balance training with feet apart and together with combinations of eyes open/closed and head still/head turns, min-mod instability with feet together balance training   Assessment/Plan    PT Assessment Patient needs continued PT services  PT Problem List Decreased strength;Decreased activity tolerance;Decreased balance;Decreased mobility          PT Treatment Interventions DME instruction;Gait training;Stair training;Functional mobility training;Therapeutic activities;Therapeutic exercise;Balance training;Neuromuscular re-education;Patient/family education    PT Goals (Current goals can be found in the Care Plan section)  Acute Rehab PT Goals Patient Stated Goal: To get back home PT Goal Formulation: With patient Time For Goal Achievement: 07/04/16 Potential to Achieve Goals: Good    Frequency Min 2X/week   Barriers to discharge        Co-evaluation               End of Session Equipment Utilized During Treatment: Gait belt Activity Tolerance: Patient limited by fatigue Patient left: in bed;with bed alarm set;with call  bell/phone within reach;with nursing/sitter in room      Functional Assessment Tool Used: clinical judgment Functional Limitation: Mobility: Walking and moving around Mobility: Walking and Moving Around Current Status [id number]): At least 40 percent but less than 60 percent impaired, limited or restricted Mobility: Walking and Moving Around Goal Status [phone]): At least 1 percent but less than 20 percent impaired, limited or restricted    Time: 1310-1346 PT Time Calculation (min) (ACUTE ONLY): 36 min   Charges:   PT Evaluation $PT Eval Low Complexity: 1 Procedure PT Treatments $Therapeutic Exercise: 8-22 mins   PT G Codes:   PT G-Codes **NOT FOR INPATIENT CLASS** Functional Assessment Tool Used: clinical judgment Functional Limitation: Mobility: Walking and moving around Mobility: Walking and Moving Around Current Status [id number]): At least 40 percent but less than 60 percent impaired, limited or restricted Mobility: Walking and Moving Around Goal Status [phone]): At least 1 percent but less than 20 percent impaired, limited or restricted    D. Scott Danzel Marszalek PT, DPT 06/21/16, 2:42 PM

## 2016-06-21 NOTE — Care Management Obs Status (Signed)
[place] NOTIFICATION   Patient Details  [name] MRN: [id number] Date of Birth: 05-29-1928   Medicare Observation Status Notification Given:  Yes    Shelbie Ammons, RN 06/21/2016, 10:13 AM

## 2016-06-21 NOTE — Progress Notes (Signed)
Called report to [facility] LPN at [facility]

## 2016-06-21 NOTE — Discharge Summary (Signed)
[facility] [place] [name]    MR#:  [id number]  DATE OF BIRTH:  Oct 12, 1927  DATE OF ADMISSION:  06/19/2016 ADMITTING PHYSICIAN: Theodoro Grist, MD  DATE OF DISCHARGE: 06/21/16  PRIMARY CARE PHYSICIAN: [name], MD    ADMISSION DIAGNOSIS:  Hypoglycemia [E16.2] Leg swelling [M79.89] Diarrhea, unspecified type [R19.7]  DISCHARGE DIAGNOSIS:  Acute gastroenteritis noro virus Hypoglycemia resolved  SECONDARY DIAGNOSIS:   Past Medical History:  Diagnosis Date  . Anemia   . Aortic stenosis, severe    a. s/p bioprosthetic aortic valve replacement in 2009  . BPH (benign prostatic hypertrophy)   . CAD (coronary artery disease)    a. s/p CABG x 1 in 2009  . Chronic diastolic (congestive) heart failure   . CVA (cerebral infarction) 06/2014   Right MCA infarct  . Diabetes mellitus   . Gastrointestinal bleed    a. reccurent GIB  . History of bladder cancer   . Hyperlipidemia   . Hypertension   . Junctional bradycardia   . Macular degeneration   . Mobitz type 1 second degree atrioventricular block 10/01/2012  . OA (osteoarthritis)   . Permanent atrial fibrillation ([place])    a. s/p Watchman device 08/10/2015; b. on [place]:   HISTORY OF PRESENT ILLNESS: [name]  is a 83 y.o. 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 Apparently patient was doing well up until day 1 of admission when he started having nausea,  vomiting and diarrhea and diffuse abdominal pain, tenesmus just prior to defecation, initially stool was mushy not became watery. He had at least 5 episodes of diarrheal stool. He was not able to eat or drink and his blood glucose levels plummeted down to 40s. He was brought to emergency room for further evaluation and treatment where he was given D50 solution, however, the patient continues to be tachypneic and feels like his lungs are wheezy. Hospitalist services were contacted for admission.   #1. Acute Norovirus infection: causing N/V/Diarrhea secondary to Noro virus - symptomatic mgmt -Clinically improved with IV fluids - isolation -Tolerating diet today. Feeling much better. Wants to be [facility] to [facility]  #2. Hypoglycemia: Secondary to nausea and vomiting with diarrhea Resolved now -  resumed diet tolerating fine - blood sugars much better. We will give him 15 units of Lantus today prior to the discharge, and if Accu-Cheks are stable enough nursing home physician to resume his Lantus 35 units from tomorrow 06/22/2016 - low dose SSI  #3.  essential hypertension - resolved, BP improved - continue patient on Coreg, and Imdur - restart lasix now (has significant LE edema)  #4. Lower extremity swelling, known the left lower extremity chronic DVT. Repeat Doppler ultrasound neg. Shows chronic DVT - Given one dose of IV Lasix on admission, will start him on 40 PO daily at this time (home dose of 80 once a day - can D/C him on home dose)  #5 Non sustained V tech - continue coreg - monitored on tele- no arrhythmias - Keep  pot close to 4 and Mg close to 2  DISCHARGE CONDITIONS:   stable  CONSULTS OBTAINED:     PROCEDURES none  DRUG ALLERGIES:   Allergies  Allergen Reactions  . Asa [Aspirin] Other (See Comments)    Reaction:  Caused stomach ulcer patient can take 81 mg.  . Losartan Other (See Comments)    Decreased pulse rate  . Metoprolol Other (See Comments)     Reaction:  Bradycardia   . Penicillins Swelling and Other (See Comments)    Has patient had a PCN reaction causing immediate rash, facial/tongue/throat swelling, SOB or lightheadedness with hypotension: No Has patient had a PCN reaction causing severe rash involving mucus membranes or skin necrosis: No Has patient had a PCN reaction that required hospitalization No Has patient had a PCN reaction occurring within the last 10 years: No If all of the above answers are "NO", then may proceed with Cephalosporin use.    DISCHARGE MEDICATIONS:   Current Discharge Medication List    CONTINUE these medications which have CHANGED   Details  insulin glargine (LANTUS) 100 UNIT/ML injection Inject 0.35 mLs (35 Units total) into the skin at bedtime. Qty: 10 mL, Refills: 11      CONTINUE these medications which have NOT CHANGED   Details  aspirin EC 81 MG tablet Take 1 tablet (81 mg total) by mouth daily. Qty: 90 tablet, Refills: 3    azelastine (OPTIVAR) 0.05 % ophthalmic solution Place 1 drop into the right eye 2 (two) times daily.    calcitRIOL (ROCALTROL) 0.25 MCG capsule TAKE ONE CAPSULE THREE TIMES A WEEK (MONDAY, WEDNESDAY, AND FRIDAY) Qty: 15 capsule, Refills: 5    carvedilol (COREG) 6.25 MG tablet Take 1 tablet (6.25 mg total) by mouth 2 (two) times daily. Qty: 180 tablet, Refills: 0    cholecalciferol (VITAMIN D) 1000 units tablet Take 1,000 Units by mouth daily.    escitalopram (LEXAPRO) 10 MG tablet TAKE 1 TABLET EVERY DAY WITH DINNER Qty: 30 tablet, Refills: 3    ferrous sulfate 325 (65 FE) MG tablet Take 325 mg by mouth daily with breakfast.    furosemide (LASIX) 80 MG tablet Take 1 tablet (80 mg total) by mouth daily. Qty: 90 tablet, Refills: 0    hydroxychloroquine (PLAQUENIL) 200 MG tablet Take 200 mg by mouth daily.    insulin aspart (NOVOLOG) 100 UNIT/ML injection Inject 5-7 Units into the skin See admin instructions. Inject 5-7 units subcutaneously 3 times daily with  meals per sliding scale: CBG 101-150 3 units, 151-200 4 units, 201-250 5 units, 251-300 7 units.    isosorbide mononitrate (IMDUR) 30 MG 24 hr tablet Take 30 mg by mouth daily.    loratadine (CLARITIN) 10 MG tablet Take 10 mg by mouth daily.    Multiple Vitamin (MULTIVITAMIN WITH MINERALS) TABS tablet Take 1 tablet by mouth daily.    Multiple Vitamins-Minerals (PRESERVISION AREDS 2) CAPS Take 1 capsule by mouth 2 (two) times daily.    pantoprazole (PROTONIX) 40 MG tablet Take 1 tablet (40 mg total) by mouth daily. Qty: 90 tablet, Refills: 3    tamsulosin (FLOMAX) 0.4 MG CAPS capsule TAKE 1 CAPSULE BY MOUTH DAILY AFTER SUPPER Qty: 30 capsule, Refills: 5    tiotropium (SPIRIVA) 18 MCG inhalation capsule Place 1 capsule (18 mcg total) into inhaler and inhale daily. Qty: 30 capsule, Refills: 12    vitamin C (ASCORBIC ACID) 500 MG tablet Take 500 mg by mouth daily.    acetaminophen (TYLENOL) 325 MG  tablet Take 2 tablets (650 mg total) by mouth every 6 (six) hours as needed for mild pain (or Fever >/= 101).         DISCHARGE INSTRUCTIONS:   Activity per PT recommendations Diabetic and cardiac Follow-up with primary care physician at the facility in 2 days. PCP to monitor blood sugars and TITRATE Lantus , plan is to resume 35 units from tomorrow 06/22/2016 Outpatient follow-up with nephrology, PCP to monitor renal function   DIET:  Cardiac diet, diabetic  DISCHARGE CONDITION:  Stable  ACTIVITY:  Activity as tolerated per PT  OXYGEN:  Home Oxygen: No.   Oxygen Delivery: room air  DISCHARGE LOCATION:  nursing home   If you experience worsening of your admission symptoms, develop shortness of breath, life threatening emergency, suicidal or homicidal thoughts you must seek medical attention immediately by calling 911 or calling your MD immediately  if symptoms less severe.  You Must read complete instructions/literature along with all the possible adverse reactions/side  effects for all the Medicines you take and that have been prescribed to you. Take any new Medicines after you have completely understood and accpet all the possible adverse reactions/side effects.   Please note  You were cared for by a hospitalist during your hospital stay. If you have any questions about your discharge medications or the care you received while you were in the hospital after you are discharged, you can call the unit and asked to speak with the hospitalist on call if the hospitalist that took care of you is not available. Once you are discharged, your primary care physician will handle any further medical issues. Please note that NO REFILLS for any discharge medications will be authorized once you are discharged, as it is imperative that you return to your primary care physician (or establish a relationship with a primary care physician if you do not have one) for your aftercare needs so that they can reassess your need for medications and monitor your lab values.     Today  Chief Complaint  Patient presents with  . Hypoglycemia   Patient is resting comfortably. Denies any nausea vomiting or diarrhea. Denies any abdominal pain. Tolerating diet. Wants to be discharged to [facility]  ROS:  CONSTITUTIONAL: Denies fevers, chills. Reporting weakness.  EYES: Denies blurry vision, double vision, eye pain. EARS, NOSE, THROAT: Denies tinnitus, ear pain, hearing loss. RESPIRATORY: Denies cough, wheeze, shortness of breath.  CARDIOVASCULAR: Denies chest pain, palpitations, edema.  GASTROINTESTINAL: Denies nausea, vomiting, diarrhea, abdominal pain. Denies bright red blood per rectum. GENITOURINARY: Denies dysuria, hematuria. ENDOCRINE: Denies nocturia or thyroid problems. HEMATOLOGIC AND LYMPHATIC: Denies easy bruising or bleeding. SKIN: Denies rash or lesion. MUSCULOSKELETAL: Denies pain in neck, back, shoulder, knees, hips or arthritic symptoms.  NEUROLOGIC: Denies paralysis,  paresthesias.  PSYCHIATRIC: Denies anxiety or depressive symptoms.   VITAL SIGNS:  Blood pressure (!) 141/54, pulse 71, temperature 97.9 F (36.6 C), temperature source Oral, resp. rate 17, height 5\' 10"  (1.778 m), weight 112.2 kg (247 lb 4.8 oz), SpO2 95 %.  I/O:    Intake/Output Summary (Last 24 hours) at 06/21/16 1153 Last data filed at 06/21/16 1000  Gross per 24 hour  Intake                0 ml  Output             1150 ml  Net            -1150 ml    PHYSICAL EXAMINATION:  GENERAL:  83 y.o.-year-old patient lying in the bed with no acute distress.  EYES: Pupils equal, round, reactive to light and accommodation. No scleral icterus. Extraocular muscles intact.  HEENT: Head atraumatic, normocephalic. Oropharynx and nasopharynx clear.  NECK:  Supple, no jugular venous distention. No thyroid enlargement, no tenderness.  LUNGS: Normal breath sounds bilaterally, no wheezing, rales,rhonchi or crepitation. No use of accessory muscles of respiration.  CARDIOVASCULAR: S1, S2 normal. No murmurs, rubs, or gallops.  ABDOMEN: Soft, non-tender, non-distended. Bowel sounds present. No organomegaly or mass.  EXTREMITIES: No pedal edema, cyanosis, or clubbing.  NEUROLOGIC: Cranial nerves II through XII are intact. Muscle strength 5/5 in all extremities. Sensation intact. Gait not checked.  PSYCHIATRIC: The patient is alert and oriented x 3.  SKIN: No obvious rash, lesion, or ulcer.   DATA REVIEW:   CBC  Recent Labs Lab 06/20/16 0509  WBC 7.2  HGB 8.1*  HCT 24.7*  PLT 215    Chemistries   Recent Labs Lab 06/19/16 1742 06/20/16 0509  NA 135 135  K 4.0 4.0  CL 100* 101  CO2 25 24  GLUCOSE 54* 172*  BUN 69* 65*  CREATININE 2.27* 2.26*  CALCIUM 8.9 8.4*  MG 2.0 1.9  AST 24  --   ALT 14*  --   ALKPHOS 119  --   BILITOT 1.0  --     Cardiac Enzymes  Recent Labs Lab 06/19/16 1742  TROPONINI <0.03    Microbiology Results  Results for orders placed or performed during  the hospital encounter of 06/19/16  Gastrointestinal Panel by PCR , Stool     Status: Abnormal   Collection Time: 06/19/16  5:42 PM  Result Value Ref Range Status   Campylobacter species NOT DETECTED NOT DETECTED Final   Plesimonas shigelloides NOT DETECTED NOT DETECTED Final   Salmonella species NOT DETECTED NOT DETECTED Final   Yersinia enterocolitica NOT DETECTED NOT DETECTED Final   Vibrio species NOT DETECTED NOT DETECTED Final   Vibrio cholerae NOT DETECTED NOT DETECTED Final   Enteroaggregative E coli (EAEC) NOT DETECTED NOT DETECTED Final   Enteropathogenic E coli (EPEC) NOT DETECTED NOT DETECTED Final   Enterotoxigenic E coli (ETEC) NOT DETECTED NOT DETECTED Final   Shiga like toxin producing E coli (STEC) NOT DETECTED NOT DETECTED Final   Shigella/Enteroinvasive E coli (EIEC) NOT DETECTED NOT DETECTED Final   Cryptosporidium NOT DETECTED NOT DETECTED Final   Cyclospora cayetanensis NOT DETECTED NOT DETECTED Final   Entamoeba histolytica NOT DETECTED NOT DETECTED Final   Giardia lamblia NOT DETECTED NOT DETECTED Final   Adenovirus F40/41 NOT DETECTED NOT DETECTED Final   Astrovirus NOT DETECTED NOT DETECTED Final   Norovirus GI/GII DETECTED (A) NOT DETECTED Final    Comment: CRITICAL RESULT CALLED TO, READ BACK BY AND VERIFIED WITH: STEVEN JONES 06/19/16 1952 KLW    Rotavirus A NOT DETECTED NOT DETECTED Final   Sapovirus (I, II, IV, and V) NOT DETECTED NOT DETECTED Final  C difficile quick scan w PCR reflex     Status: None   Collection Time: 06/19/16  5:42 PM  Result Value Ref Range Status   C Diff antigen NEGATIVE NEGATIVE Final   C Diff toxin NEGATIVE NEGATIVE Final   C Diff interpretation No C. difficile detected.  Final  Rapid Influenza A&B Antigens ([facility] only)     Status: None   Collection Time: 06/19/16  6:56 PM  Result Value Ref Range Status   Influenza A [facility]) NEGATIVE NEGATIVE Final  Influenza B ([place]) NEGATIVE NEGATIVE Final  MRSA PCR Screening     Status:  None   Collection Time: 06/20/16  2:31 AM  Result Value Ref Range Status   MRSA by PCR NEGATIVE NEGATIVE Final    Comment:        The GeneXpert MRSA Assay (FDA approved for NASAL specimens only), is one component of a comprehensive MRSA colonization surveillance program. It is not intended to diagnose MRSA infection nor to guide or monitor treatment for MRSA infections.     RADIOLOGY:  US Venous Img Lower Bilateral  Result Date: 06/19/2016 CLINICAL DATA:  Bilateral lower extremity edema history of bladder cancer EXAM: BILATERAL LOWER EXTREMITY VENOUS DOPPLER ULTRASOUND TECHNIQUE: Gray-scale sonography with graded compression, as well as color Doppler and duplex ultrasound were performed to evaluate the lower extremity deep venous systems from the level of the common femoral vein and including the common femoral, femoral, profunda femoral, popliteal and calf veins including the posterior tibial, peroneal and gastrocnemius veins when visible. The superficial great saphenous vein was also interrogated. Spectral Doppler was utilized to evaluate flow at rest and with distal augmentation maneuvers in the common femoral, femoral and popliteal veins. COMPARISON:  02/05/2016 FINDINGS: RIGHT LOWER EXTREMITY Common Femoral Vein: No evidence of acute thrombus. Normal compressibility, respiratory phasicity and response to augmentation. Mild echogenic wall thickening. Saphenofemoral Junction: No evidence of thrombus. Normal compressibility and flow on color Doppler imaging. Profunda Femoral Vein: No evidence of thrombus. Normal compressibility and flow on color Doppler imaging. Femoral Vein: No evidence of thrombus. Normal compressibility, respiratory phasicity and response to augmentation. Popliteal Vein: No evidence of thrombus. Normal compressibility, respiratory phasicity and response to augmentation. Calf Veins: No evidence of thrombus. Normal compressibility and flow on color Doppler imaging. Venous  Reflux:  None. Other Findings:  None. LEFT LOWER EXTREMITY Common Femoral Vein: Mild echogenic wall thickening as noted on comparison study could relate to sequela of chronic DVT. Saphenofemoral Junction: No evidence of thrombus. Normal compressibility and flow on color Doppler imaging. Profunda Femoral Vein: No evidence of thrombus. Normal compressibility and flow on color Doppler imaging. Femoral Vein: No evidence of thrombus. Normal compressibility, respiratory phasicity and response to augmentation. Popliteal Vein: No evidence of thrombus. Normal compressibility, respiratory phasicity and response to augmentation. Calf Veins: Nonvisualization of corona healed veins due to edema Venous Reflux:  None. Other Findings:  None. IMPRESSION: 1. No sonographic evidence for acute DVT within the bilateral lower extremities. 2. Echogenic wall thickening of the common femoral veins (stable on the left) possibly related to chronic DVT, this finding is unchanged. Electronically Signed   By: Donavan Foil M.D.   On: 06/19/2016 20:46    EKG:   Orders placed or performed during the hospital encounter of 06/19/16  . ED EKG  . ED EKG      Management plans discussed with the patient, family and they are in agreement.  CODE STATUS:     Code Status Orders        Start     Ordered   06/19/16 1936  Limited resuscitation (code)  Continuous    Question Answer Comment  In the event of cardiac or respiratory ARREST: Initiate Code Blue, Call Rapid Response No   In the event of cardiac or respiratory ARREST: Perform CPR No   In the event of cardiac or respiratory ARREST: Perform Intubation/Mechanical Ventilation No   In the event of cardiac or respiratory ARREST: Use NIPPV/BiPAp only if indicated Yes   In the  event of cardiac or respiratory ARREST: Administer ACLS medications if indicated No   In the event of cardiac or respiratory ARREST: Perform Defibrillation or Cardioversion if indicated No      06/19/16  1935    Code Status History    Date Active Date Inactive Code Status Order ID Comments User Context   06/01/2016  6:21 PM 06/04/2016  6:52 PM Full Code [id number]  Shon Millet, DO Inpatient   05/08/2016  8:27 AM 05/16/2016  4:41 PM DNR [id number]  Everrett Coombe, MD Inpatient   05/02/2016  2:18 PM 05/08/2016  8:27 AM Full Code [id number]  Steve Rattler, DO Inpatient   05/02/2016  1:22 PM 05/02/2016  2:18 PM Partial Code [id number]  Elberta Leatherwood, MD ED   05/02/2016  1:12 PM 05/02/2016  1:22 PM DNR [id number]  Merrily Pew, MD ED   03/22/2016  3:48 PM 03/24/2016  4:45 PM Full Code [id number]  Loletha Grayer, MD ED   09/19/2015  7:27 PM 09/21/2015  5:47 PM Full Code [id number]  Lytle Butte, MD ED   07/28/2015  6:39 PM 07/31/2015  4:57 PM Full Code [id number]  [name] Costa, MD ED   07/10/2015 11:37 PM 07/12/2015 11:15 PM Full Code [id number]  Rise Patience, MD Inpatient   04/12/2015  7:05 PM 04/17/2015  7:10 PM Full Code [id number]  Geradine Girt, DO Inpatient   06/29/2014 12:24 PM 07/09/2014  2:15 PM Full Code [id number]  Bary Leriche, PA-C Inpatient    Advance Directive Documentation   Flowsheet Row Most Recent Value  Type of Advance Directive  Healthcare Power of Attorney  Pre-existing out of facility DNR order (yellow form or pink MOST form)  No data  "MOST" Form in Place?  No data      TOTAL TIME TAKING CARE OF THIS PATIENT: 45 minutes.   Note: This dictation was prepared with Dragon dictation along with smaller phrase technology. Any transcriptional errors that result from this process are unintentional.  More than 50% time was spent on coordination and counseling Plan of care discussed with patient's son Dr. Cathie Olden, he is agreeable with the current plan of care @[facility] @  on 06/21/2016 at 11:53 AM  Between 7am to 6pm - Pager - [phone]  After 6pm go to [email or web address] - password [email or web address] [facility] [facility]  Office  [phone]  CC: Primary care physician; [name], MD

## 2016-06-21 NOTE — Care Management (Signed)
Spoke with son, Izrael Dragos yesterday. States he is going out of town today (Friday). Aware that his father may be transferred back to [employer] today. Would like to called with update plan [phone]. Shelbie Ammons RN MSN CCM Care Management

## 2016-06-21 NOTE — Progress Notes (Signed)
Inpatient Diabetes Program Recommendations  AACE/ADA: New Consensus Statement on Inpatient Glycemic Control (2015)  Target Ranges:  Prepandial:   less than 140 mg/dL      Peak postprandial:   less than 180 mg/dL (1-2 hours)      Critically ill patients:  140 - 180 mg/dL   Lab Results  Component Value Date   GLUCAP 351 (H) 06/21/2016   HGBA1C 6.6 (H) 05/02/2016    Review of Glycemic Control  Results for JESON, BARROZO (MRN [id number]) as of 06/21/2016 09:55  Ref. Range 06/20/2016 11:57 06/20/2016 13:35 06/20/2016 16:39 06/20/2016 21:08 06/21/2016 07:31  Glucose-Capillary Latest Ref Range: 65 - 99 mg/dL 194 (H) 206 (H) 222 (H) 239 (H) 351 (H)   Diabetes history: Type 2 Outpatient Diabetes medications: Lantus 35 units qday, Novolog 5-7 units tid Current orders for Inpatient glycemic control: Novolog 0-9 units tid, Novolog 0-5 units qhs  Inpatient Diabetes Program Recommendations:  Consider adding Lantus 5 units qhs  Gentry Fitz, RN, [place], [place], CDE Diabetes Coordinator Inpatient Diabetes Program  [phone] (Team Pager) [phone] ([place]) 06/21/2016 9:56 AM

## 2016-06-21 NOTE — Discharge Instructions (Signed)
Activity per PT recommendations  Activity per PT recommendations Diabetic and cardiac Follow-up with primary care physician at the facility in 2 days. PCP to monitor blood sugars and TITRATE Lantus , plan is to resume 35 units from tomorrow 06/22/2016 Outpatient follow-up with nephrology, PCP to monitor renal function

## 2016-06-24 ENCOUNTER — Non-Acute Institutional Stay (SKILLED_NURSING_FACILITY): Payer: Medicare Other | Admitting: Gerontology

## 2016-06-24 DIAGNOSIS — R600 Localized edema: Secondary | ICD-10-CM

## 2016-06-24 DIAGNOSIS — I10 Essential (primary) hypertension: Secondary | ICD-10-CM | POA: Diagnosis not present

## 2016-06-24 DIAGNOSIS — R296 Repeated falls: Secondary | ICD-10-CM | POA: Diagnosis not present

## 2016-06-24 DIAGNOSIS — E161 Other hypoglycemia: Secondary | ICD-10-CM | POA: Diagnosis not present

## 2016-06-24 DIAGNOSIS — R197 Diarrhea, unspecified: Secondary | ICD-10-CM | POA: Diagnosis not present

## 2016-06-24 DIAGNOSIS — I509 Heart failure, unspecified: Secondary | ICD-10-CM | POA: Diagnosis not present

## 2016-06-24 LAB — COMPREHENSIVE METABOLIC PANEL
ALBUMIN: 3.3 g/dL — AB (ref 3.5–5.0)
ALK PHOS: 114 U/L (ref 38–126)
ALT: 16 U/L — AB (ref 17–63)
ANION GAP: 10 (ref 5–15)
AST: 25 U/L (ref 15–41)
BILIRUBIN TOTAL: 0.9 mg/dL (ref 0.3–1.2)
BUN: 58 mg/dL — AB (ref 6–20)
CALCIUM: 8.7 mg/dL — AB (ref 8.9–10.3)
CO2: 26 mmol/L (ref 22–32)
Chloride: 100 mmol/L — ABNORMAL LOW (ref 101–111)
Creatinine, Ser: 2.16 mg/dL — ABNORMAL HIGH (ref 0.61–1.24)
GFR calc Af Amer: 30 mL/min — ABNORMAL LOW (ref >60)
GFR calc non Af Amer: 26 mL/min — ABNORMAL LOW (ref >60)
GLUCOSE: 86 mg/dL (ref 65–99)
Potassium: 3.6 mmol/L (ref 3.5–5.1)
SODIUM: 136 mmol/L (ref 135–145)
TOTAL PROTEIN: 6.4 g/dL — AB (ref 6.5–8.1)

## 2016-06-24 LAB — INFLUENZA PANEL BY PCR (TYPE A & B)
Influenza A By PCR: NEGATIVE
Influenza B By PCR: NEGATIVE

## 2016-06-24 LAB — CBC WITH DIFFERENTIAL/PLATELET
BASOS ABS: 0 10*3/uL (ref 0–0.1)
BASOS PCT: 1 %
EOS ABS: 0.5 10*3/uL (ref 0–0.7)
Eosinophils Relative: 10 %
HEMATOCRIT: 25.7 % — AB (ref 40.0–52.0)
HEMOGLOBIN: 8.7 g/dL — AB (ref 13.0–18.0)
Lymphocytes Relative: 9 %
Lymphs Abs: 0.5 10*3/uL — ABNORMAL LOW (ref 1.0–3.6)
MCH: 28.5 pg (ref 26.0–34.0)
MCHC: 33.7 g/dL (ref 32.0–36.0)
MCV: 84.5 fL (ref 80.0–100.0)
Monocytes Absolute: 0.8 10*3/uL (ref 0.2–1.0)
Monocytes Relative: 14 %
NEUTROS ABS: 3.8 10*3/uL (ref 1.4–6.5)
NEUTROS PCT: 66 %
Platelets: 218 10*3/uL (ref 150–440)
RBC: 3.04 MIL/uL — AB (ref 4.40–5.90)
RDW: 15.4 % — ABNORMAL HIGH (ref 11.5–14.5)
WBC: 5.7 10*3/uL (ref 3.8–10.6)

## 2016-06-24 LAB — URINALYSIS, COMPLETE (UACMP) WITH MICROSCOPIC
BACTERIA UA: NONE SEEN
Bilirubin Urine: NEGATIVE
HGB URINE DIPSTICK: NEGATIVE
Ketones, ur: NEGATIVE mg/dL
Leukocytes, UA: NEGATIVE
Nitrite: NEGATIVE
PROTEIN: 30 mg/dL — AB
Specific Gravity, Urine: 1.007 (ref 1.005–1.030)
pH: 5 (ref 5.0–8.0)

## 2016-06-25 LAB — URINE CULTURE

## 2016-07-04 ENCOUNTER — Non-Acute Institutional Stay (SKILLED_NURSING_FACILITY): Payer: Medicare Other | Admitting: Gerontology

## 2016-07-04 DIAGNOSIS — I5032 Chronic diastolic (congestive) heart failure: Secondary | ICD-10-CM

## 2016-07-04 DIAGNOSIS — R296 Repeated falls: Secondary | ICD-10-CM | POA: Diagnosis not present

## 2016-07-04 DIAGNOSIS — D638 Anemia in other chronic diseases classified elsewhere: Secondary | ICD-10-CM | POA: Diagnosis not present
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
## 2016-07-06 DIAGNOSIS — I482 Chronic atrial fibrillation: Secondary | ICD-10-CM | POA: Diagnosis not present

## 2016-07-06 DIAGNOSIS — Z794 Long term (current) use of insulin: Secondary | ICD-10-CM | POA: Diagnosis not present

## 2016-07-06 DIAGNOSIS — Z952 Presence of prosthetic heart valve: Secondary | ICD-10-CM | POA: Diagnosis not present

## 2016-07-06 DIAGNOSIS — D638 Anemia in other chronic diseases classified elsewhere: Secondary | ICD-10-CM | POA: Diagnosis not present

## 2016-07-06 DIAGNOSIS — Z8551 Personal history of malignant neoplasm of bladder: Secondary | ICD-10-CM | POA: Diagnosis not present

## 2016-07-06 DIAGNOSIS — I5032 Chronic diastolic (congestive) heart failure: Secondary | ICD-10-CM | POA: Diagnosis not present

## 2016-07-06 DIAGNOSIS — I69354 Hemiplegia and hemiparesis following cerebral infarction affecting left non-dominant side: Secondary | ICD-10-CM | POA: Diagnosis not present

## 2016-07-06 DIAGNOSIS — Z7982 Long term (current) use of aspirin: Secondary | ICD-10-CM | POA: Diagnosis not present

## 2016-07-06 DIAGNOSIS — E1122 Type 2 diabetes mellitus with diabetic chronic kidney disease: Secondary | ICD-10-CM | POA: Diagnosis not present

## 2016-07-06 DIAGNOSIS — Z951 Presence of aortocoronary bypass graft: Secondary | ICD-10-CM | POA: Diagnosis not present

## 2016-07-06 DIAGNOSIS — I2510 Atherosclerotic heart disease of native coronary artery without angina pectoris: Secondary | ICD-10-CM | POA: Diagnosis not present

## 2016-07-06 DIAGNOSIS — I82502 Chronic embolism and thrombosis of unspecified deep veins of left lower extremity: Secondary | ICD-10-CM | POA: Diagnosis not present

## 2016-07-06 DIAGNOSIS — I130 Hypertensive heart and chronic kidney disease with heart failure and stage 1 through stage 4 chronic kidney disease, or unspecified chronic kidney disease: Secondary | ICD-10-CM | POA: Diagnosis not present

## 2016-07-06 DIAGNOSIS — N184 Chronic kidney disease, stage 4 (severe): Secondary | ICD-10-CM | POA: Diagnosis not present

## 2016-07-08 ENCOUNTER — Telehealth: Payer: Self-pay | Admitting: Internal Medicine

## 2016-07-08 NOTE — Telephone Encounter (Signed)
Amy from [facility] called and stated that they noticed that pt has an allergy to Aspirin but pt has been taking it with no issues, so she just has to report it to Dr. Derrel Nip.  Call pt @ [phone]

## 2016-07-09 DIAGNOSIS — N184 Chronic kidney disease, stage 4 (severe): Secondary | ICD-10-CM | POA: Diagnosis not present

## 2016-07-09 DIAGNOSIS — I5032 Chronic diastolic (congestive) heart failure: Secondary | ICD-10-CM | POA: Diagnosis not present

## 2016-07-09 DIAGNOSIS — D638 Anemia in other chronic diseases classified elsewhere: Secondary | ICD-10-CM | POA: Diagnosis not present

## 2016-07-09 DIAGNOSIS — I69354 Hemiplegia and hemiparesis following cerebral infarction affecting left non-dominant side: Secondary | ICD-10-CM | POA: Diagnosis not present

## 2016-07-09 DIAGNOSIS — E1122 Type 2 diabetes mellitus with diabetic chronic kidney disease: Secondary | ICD-10-CM | POA: Diagnosis not present

## 2016-07-09 DIAGNOSIS — I130 Hypertensive heart and chronic kidney disease with heart failure and stage 1 through stage 4 chronic kidney disease, or unspecified chronic kidney disease: Secondary | ICD-10-CM | POA: Diagnosis not present

## 2016-07-10 DIAGNOSIS — E1122 Type 2 diabetes mellitus with diabetic chronic kidney disease: Secondary | ICD-10-CM | POA: Diagnosis not present

## 2016-07-10 DIAGNOSIS — N2581 Secondary hyperparathyroidism of renal origin: Secondary | ICD-10-CM | POA: Diagnosis not present

## 2016-07-10 DIAGNOSIS — R809 Proteinuria, unspecified: Secondary | ICD-10-CM | POA: Diagnosis not present

## 2016-07-10 DIAGNOSIS — I129 Hypertensive chronic kidney disease with stage 1 through stage 4 chronic kidney disease, or unspecified chronic kidney disease: Secondary | ICD-10-CM | POA: Diagnosis not present

## 2016-07-10 DIAGNOSIS — N184 Chronic kidney disease, stage 4 (severe): Secondary | ICD-10-CM | POA: Diagnosis not present

## 2016-07-11 ENCOUNTER — Encounter: Admission: RE | Admit: 2016-07-11 | Payer: Medicare Other | Source: Ambulatory Visit | Admitting: Internal Medicine

## 2016-07-11 DIAGNOSIS — D638 Anemia in other chronic diseases classified elsewhere: Secondary | ICD-10-CM | POA: Diagnosis not present

## 2016-07-11 DIAGNOSIS — E1122 Type 2 diabetes mellitus with diabetic chronic kidney disease: Secondary | ICD-10-CM | POA: Diagnosis not present

## 2016-07-11 DIAGNOSIS — I130 Hypertensive heart and chronic kidney disease with heart failure and stage 1 through stage 4 chronic kidney disease, or unspecified chronic kidney disease: Secondary | ICD-10-CM | POA: Diagnosis not present

## 2016-07-11 DIAGNOSIS — N184 Chronic kidney disease, stage 4 (severe): Secondary | ICD-10-CM | POA: Diagnosis not present

## 2016-07-11 DIAGNOSIS — I69354 Hemiplegia and hemiparesis following cerebral infarction affecting left non-dominant side: Secondary | ICD-10-CM | POA: Diagnosis not present

## 2016-07-11 DIAGNOSIS — I5032 Chronic diastolic (congestive) heart failure: Secondary | ICD-10-CM | POA: Diagnosis not present

## 2016-07-12 DIAGNOSIS — D638 Anemia in other chronic diseases classified elsewhere: Secondary | ICD-10-CM | POA: Diagnosis not present

## 2016-07-12 DIAGNOSIS — I5032 Chronic diastolic (congestive) heart failure: Secondary | ICD-10-CM | POA: Diagnosis not present

## 2016-07-12 DIAGNOSIS — E1122 Type 2 diabetes mellitus with diabetic chronic kidney disease: Secondary | ICD-10-CM | POA: Diagnosis not present

## 2016-07-12 DIAGNOSIS — N184 Chronic kidney disease, stage 4 (severe): Secondary | ICD-10-CM | POA: Diagnosis not present

## 2016-07-12 DIAGNOSIS — I130 Hypertensive heart and chronic kidney disease with heart failure and stage 1 through stage 4 chronic kidney disease, or unspecified chronic kidney disease: Secondary | ICD-10-CM | POA: Diagnosis not present

## 2016-07-12 DIAGNOSIS — I69354 Hemiplegia and hemiparesis following cerebral infarction affecting left non-dominant side: Secondary | ICD-10-CM | POA: Diagnosis not present

## 2016-07-14 ENCOUNTER — Telehealth: Payer: Self-pay | Admitting: Internal Medicine

## 2016-07-14 ENCOUNTER — Other Ambulatory Visit: Payer: Self-pay | Admitting: Internal Medicine

## 2016-07-14 DIAGNOSIS — L89159 Pressure ulcer of sacral region, unspecified stage: Secondary | ICD-10-CM

## 2016-07-14 NOTE — Telephone Encounter (Signed)
[name],  Need to see [name] on my schedule on Monday for 15 minute visit to get home health to manage his pressure ulcers  and so I can address his low blood sugars (see staff message I forwarded to you from his son Dr. Acie Fredrickson

## 2016-07-15 ENCOUNTER — Ambulatory Visit: Payer: Medicare Other | Admitting: Family

## 2016-07-15 DIAGNOSIS — I69354 Hemiplegia and hemiparesis following cerebral infarction affecting left non-dominant side: Secondary | ICD-10-CM | POA: Diagnosis not present

## 2016-07-15 DIAGNOSIS — I130 Hypertensive heart and chronic kidney disease with heart failure and stage 1 through stage 4 chronic kidney disease, or unspecified chronic kidney disease: Secondary | ICD-10-CM | POA: Diagnosis not present

## 2016-07-15 DIAGNOSIS — D638 Anemia in other chronic diseases classified elsewhere: Secondary | ICD-10-CM | POA: Diagnosis not present

## 2016-07-15 DIAGNOSIS — E1122 Type 2 diabetes mellitus with diabetic chronic kidney disease: Secondary | ICD-10-CM | POA: Diagnosis not present

## 2016-07-15 DIAGNOSIS — N184 Chronic kidney disease, stage 4 (severe): Secondary | ICD-10-CM | POA: Diagnosis not present

## 2016-07-15 DIAGNOSIS — I5032 Chronic diastolic (congestive) heart failure: Secondary | ICD-10-CM | POA: Diagnosis not present

## 2016-07-15 NOTE — Telephone Encounter (Signed)
Pt is scheduled for 4:30 with M. Arnett.

## 2016-07-15 NOTE — Telephone Encounter (Signed)
Left message for patient to return call to schedule appointment

## 2016-07-16 ENCOUNTER — Inpatient Hospital Stay
Admission: EM | Admit: 2016-07-16 | Discharge: 2016-07-19 | DRG: 291 | Disposition: A | Discharge status: Home Health | Payer: Medicare Other | Attending: Specialist | Admitting: Specialist

## 2016-07-16 ENCOUNTER — Encounter: Payer: Self-pay | Admitting: Family

## 2016-07-16 ENCOUNTER — Emergency Department: Payer: Medicare Other

## 2016-07-16 ENCOUNTER — Encounter: Payer: Self-pay | Admitting: *Deleted

## 2016-07-16 ENCOUNTER — Ambulatory Visit (INDEPENDENT_AMBULATORY_CARE_PROVIDER_SITE_OTHER): Payer: Medicare Other | Admitting: Family

## 2016-07-16 ENCOUNTER — Other Ambulatory Visit: Payer: Self-pay

## 2016-07-16 VITALS — BP 134/66 | HR 60 | Temp 97.7°F | Ht 70.0 in | Wt 253.4 lb

## 2016-07-16 DIAGNOSIS — I2510 Atherosclerotic heart disease of native coronary artery without angina pectoris: Secondary | ICD-10-CM | POA: Diagnosis not present

## 2016-07-16 DIAGNOSIS — I350 Nonrheumatic aortic (valve) stenosis: Secondary | ICD-10-CM | POA: Diagnosis not present

## 2016-07-16 DIAGNOSIS — Z953 Presence of xenogenic heart valve: Secondary | ICD-10-CM | POA: Diagnosis not present

## 2016-07-16 DIAGNOSIS — Z7982 Long term (current) use of aspirin: Secondary | ICD-10-CM

## 2016-07-16 DIAGNOSIS — E1122 Type 2 diabetes mellitus with diabetic chronic kidney disease: Secondary | ICD-10-CM | POA: Diagnosis present

## 2016-07-16 DIAGNOSIS — I482 Chronic atrial fibrillation, unspecified: Secondary | ICD-10-CM | POA: Diagnosis present

## 2016-07-16 DIAGNOSIS — M1990 Unspecified osteoarthritis, unspecified site: Secondary | ICD-10-CM

## 2016-07-16 DIAGNOSIS — L89151 Pressure ulcer of sacral region, stage 1: Secondary | ICD-10-CM | POA: Diagnosis present

## 2016-07-16 DIAGNOSIS — I4891 Unspecified atrial fibrillation: Secondary | ICD-10-CM | POA: Diagnosis not present

## 2016-07-16 DIAGNOSIS — I5032 Chronic diastolic (congestive) heart failure: Secondary | ICD-10-CM

## 2016-07-16 DIAGNOSIS — E162 Hypoglycemia, unspecified: Secondary | ICD-10-CM | POA: Diagnosis not present

## 2016-07-16 DIAGNOSIS — I5033 Acute on chronic diastolic (congestive) heart failure: Secondary | ICD-10-CM | POA: Diagnosis not present

## 2016-07-16 DIAGNOSIS — R609 Edema, unspecified: Secondary | ICD-10-CM | POA: Diagnosis not present

## 2016-07-16 DIAGNOSIS — D649 Anemia, unspecified: Secondary | ICD-10-CM | POA: Diagnosis not present

## 2016-07-16 DIAGNOSIS — L8990 Pressure ulcer of unspecified site, unspecified stage: Secondary | ICD-10-CM

## 2016-07-16 DIAGNOSIS — Z8673 Personal history of transient ischemic attack (TIA), and cerebral infarction without residual deficits: Secondary | ICD-10-CM | POA: Diagnosis not present

## 2016-07-16 DIAGNOSIS — Z8042 Family history of malignant neoplasm of prostate: Secondary | ICD-10-CM | POA: Diagnosis not present

## 2016-07-16 DIAGNOSIS — R262 Difficulty in walking, not elsewhere classified: Secondary | ICD-10-CM | POA: Diagnosis not present

## 2016-07-16 DIAGNOSIS — L89892 Pressure ulcer of other site, stage 2: Secondary | ICD-10-CM | POA: Diagnosis present

## 2016-07-16 DIAGNOSIS — D631 Anemia in chronic kidney disease: Secondary | ICD-10-CM | POA: Diagnosis not present

## 2016-07-16 DIAGNOSIS — K5900 Constipation, unspecified: Secondary | ICD-10-CM | POA: Diagnosis not present

## 2016-07-16 DIAGNOSIS — Z880 Allergy status to penicillin: Secondary | ICD-10-CM | POA: Diagnosis not present

## 2016-07-16 DIAGNOSIS — Z951 Presence of aortocoronary bypass graft: Secondary | ICD-10-CM

## 2016-07-16 DIAGNOSIS — R0603 Acute respiratory distress: Secondary | ICD-10-CM | POA: Diagnosis present

## 2016-07-16 DIAGNOSIS — Z886 Allergy status to analgesic agent status: Secondary | ICD-10-CM

## 2016-07-16 DIAGNOSIS — Z888 Allergy status to other drugs, medicaments and biological substances status: Secondary | ICD-10-CM

## 2016-07-16 DIAGNOSIS — L89811 Pressure ulcer of head, stage 1: Secondary | ICD-10-CM | POA: Diagnosis present

## 2016-07-16 DIAGNOSIS — K219 Gastro-esophageal reflux disease without esophagitis: Secondary | ICD-10-CM | POA: Diagnosis present

## 2016-07-16 DIAGNOSIS — I639 Cerebral infarction, unspecified: Secondary | ICD-10-CM | POA: Diagnosis not present

## 2016-07-16 DIAGNOSIS — D638 Anemia in other chronic diseases classified elsewhere: Secondary | ICD-10-CM

## 2016-07-16 DIAGNOSIS — N183 Chronic kidney disease, stage 3 unspecified: Secondary | ICD-10-CM

## 2016-07-16 DIAGNOSIS — D509 Iron deficiency anemia, unspecified: Secondary | ICD-10-CM | POA: Diagnosis not present

## 2016-07-16 DIAGNOSIS — Z8551 Personal history of malignant neoplasm of bladder: Secondary | ICD-10-CM

## 2016-07-16 DIAGNOSIS — E871 Hypo-osmolality and hyponatremia: Secondary | ICD-10-CM | POA: Diagnosis not present

## 2016-07-16 DIAGNOSIS — Z833 Family history of diabetes mellitus: Secondary | ICD-10-CM | POA: Diagnosis not present

## 2016-07-16 DIAGNOSIS — R600 Localized edema: Secondary | ICD-10-CM | POA: Diagnosis not present

## 2016-07-16 DIAGNOSIS — R0602 Shortness of breath: Secondary | ICD-10-CM

## 2016-07-16 DIAGNOSIS — M7989 Other specified soft tissue disorders: Secondary | ICD-10-CM | POA: Diagnosis not present

## 2016-07-16 DIAGNOSIS — J449 Chronic obstructive pulmonary disease, unspecified: Secondary | ICD-10-CM | POA: Diagnosis present

## 2016-07-16 DIAGNOSIS — Z823 Family history of stroke: Secondary | ICD-10-CM

## 2016-07-16 DIAGNOSIS — R079 Chest pain, unspecified: Secondary | ICD-10-CM | POA: Diagnosis not present

## 2016-07-16 DIAGNOSIS — N400 Enlarged prostate without lower urinary tract symptoms: Secondary | ICD-10-CM | POA: Diagnosis present

## 2016-07-16 DIAGNOSIS — Z8719 Personal history of other diseases of the digestive system: Secondary | ICD-10-CM

## 2016-07-16 DIAGNOSIS — N189 Chronic kidney disease, unspecified: Secondary | ICD-10-CM

## 2016-07-16 DIAGNOSIS — Z87891 Personal history of nicotine dependence: Secondary | ICD-10-CM | POA: Diagnosis not present

## 2016-07-16 DIAGNOSIS — I130 Hypertensive heart and chronic kidney disease with heart failure and stage 1 through stage 4 chronic kidney disease, or unspecified chronic kidney disease: Secondary | ICD-10-CM | POA: Diagnosis not present

## 2016-07-16 DIAGNOSIS — M6281 Muscle weakness (generalized): Secondary | ICD-10-CM

## 2016-07-16 DIAGNOSIS — I5031 Acute diastolic (congestive) heart failure: Secondary | ICD-10-CM | POA: Diagnosis not present

## 2016-07-16 LAB — BASIC METABOLIC PANEL
ANION GAP: 8 (ref 5–15)
BUN: 51 mg/dL — ABNORMAL HIGH (ref 6–20)
CALCIUM: 8.6 mg/dL — AB (ref 8.9–10.3)
CO2: 26 mmol/L (ref 22–32)
CREATININE: 2.35 mg/dL — AB (ref 0.61–1.24)
Chloride: 97 mmol/L — ABNORMAL LOW (ref 101–111)
GFR calc non Af Amer: 23 mL/min — ABNORMAL LOW (ref >60)
GFR, EST AFRICAN AMERICAN: 27 mL/min — AB (ref >60)
Glucose, Bld: 160 mg/dL — ABNORMAL HIGH (ref 65–99)
Potassium: 4.6 mmol/L (ref 3.5–5.1)
SODIUM: 131 mmol/L — AB (ref 135–145)

## 2016-07-16 LAB — CBC
HCT: 23.8 % — ABNORMAL LOW (ref 40.0–52.0)
HEMOGLOBIN: 7.6 g/dL — AB (ref 13.0–18.0)
MCH: 27.2 pg (ref 26.0–34.0)
MCHC: 32.1 g/dL (ref 32.0–36.0)
MCV: 84.8 fL (ref 80.0–100.0)
Platelets: 218 10*3/uL (ref 150–440)
RBC: 2.81 MIL/uL — AB (ref 4.40–5.90)
RDW: 15.9 % — AB (ref 11.5–14.5)
WBC: 5.4 10*3/uL (ref 3.8–10.6)

## 2016-07-16 LAB — PREPARE RBC (CROSSMATCH)

## 2016-07-16 LAB — GLUCOSE, CAPILLARY: GLUCOSE-CAPILLARY: 147 mg/dL — AB (ref 65–99)

## 2016-07-16 LAB — TROPONIN I: Troponin I: 0.03 ng/mL (ref <0.03)

## 2016-07-16 LAB — BRAIN NATRIURETIC PEPTIDE: B Natriuretic Peptide: 282 pg/mL — ABNORMAL HIGH (ref 0.0–100.0)

## 2016-07-16 MED ORDER — VITAMIN C 500 MG PO TABS
500.0000 mg | ORAL_TABLET | Freq: Every day | ORAL | Status: DC
  Administered 2016-07-17 – 2016-07-19 (×3): 500 mg via ORAL
  Filled 2016-07-16 (×3): qty 1

## 2016-07-16 MED ORDER — ADULT MULTIVITAMIN W/MINERALS CH
1.0000 | ORAL_TABLET | Freq: Every day | ORAL | Status: DC
  Administered 2016-07-17 – 2016-07-19 (×3): 1 via ORAL
  Filled 2016-07-16 (×3): qty 1

## 2016-07-16 MED ORDER — INSULIN ASPART 100 UNIT/ML ~~LOC~~ SOLN
0.0000 [IU] | Freq: Three times a day (TID) | SUBCUTANEOUS | Status: DC
  Administered 2016-07-17 (×2): 11 [IU] via SUBCUTANEOUS
  Filled 2016-07-16 (×2): qty 11

## 2016-07-16 MED ORDER — SODIUM CHLORIDE 0.9% FLUSH: 3.0000 mL | INTRAVENOUS | Status: DC | PRN

## 2016-07-16 MED ORDER — SODIUM CHLORIDE 0.9% FLUSH
3.0000 mL | Freq: Two times a day (BID) | INTRAVENOUS | Status: DC
  Administered 2016-07-16 – 2016-07-19 (×6): 3 mL via INTRAVENOUS

## 2016-07-16 MED ORDER — ACETAMINOPHEN 325 MG PO TABS: 650.0000 mg | ORAL_TABLET | ORAL | Status: DC | PRN

## 2016-07-16 MED ORDER — FUROSEMIDE 40 MG PO TABS: 80.0000 mg | ORAL_TABLET | Freq: Every day | ORAL | Status: DC

## 2016-07-16 MED ORDER — ESCITALOPRAM OXALATE 10 MG PO TABS
10.0000 mg | ORAL_TABLET | Freq: Every day | ORAL | Status: DC
  Administered 2016-07-17 – 2016-07-19 (×3): 10 mg via ORAL
  Filled 2016-07-16 (×3): qty 1

## 2016-07-16 MED ORDER — FUROSEMIDE 10 MG/ML IJ SOLN
60.0000 mg | Freq: Once | INTRAMUSCULAR | Status: AC
  Administered 2016-07-16: 60 mg via INTRAVENOUS
  Filled 2016-07-16: qty 8

## 2016-07-16 MED ORDER — ALPRAZOLAM 0.25 MG PO TABS: 0.2500 mg | ORAL_TABLET | Freq: Two times a day (BID) | ORAL | Status: DC | PRN

## 2016-07-16 MED ORDER — ENOXAPARIN SODIUM 30 MG/0.3ML ~~LOC~~ SOLN: 30.0000 mg | SUBCUTANEOUS | Status: DC

## 2016-07-16 MED ORDER — FUROSEMIDE 10 MG/ML IJ SOLN
40.0000 mg | Freq: Every day | INTRAMUSCULAR | Status: DC
  Administered 2016-07-17 – 2016-07-18 (×2): 40 mg via INTRAVENOUS
  Filled 2016-07-16 (×2): qty 4

## 2016-07-16 MED ORDER — SODIUM CHLORIDE 0.9 % IV SOLN
10.0000 mL/h | Freq: Once | INTRAVENOUS | Status: AC
  Administered 2016-07-16: 10 mL/h via INTRAVENOUS

## 2016-07-16 MED ORDER — PANTOPRAZOLE SODIUM 40 MG PO TBEC
40.0000 mg | DELAYED_RELEASE_TABLET | Freq: Every day | ORAL | Status: DC
  Administered 2016-07-17 – 2016-07-19 (×3): 40 mg via ORAL
  Filled 2016-07-16 (×3): qty 1

## 2016-07-16 MED ORDER — SODIUM CHLORIDE 0.9 % IV SOLN
Freq: Once | INTRAVENOUS | Status: AC
  Administered 2016-07-16: via INTRAVENOUS

## 2016-07-16 MED ORDER — ZOLPIDEM TARTRATE 5 MG PO TABS
5.0000 mg | ORAL_TABLET | Freq: Every evening | ORAL | Status: DC | PRN
  Administered 2016-07-18: 5 mg via ORAL
  Filled 2016-07-16: qty 1

## 2016-07-16 MED ORDER — CALCITRIOL 0.25 MCG PO CAPS
0.2500 ug | ORAL_CAPSULE | ORAL | Status: DC
Start: 2016-07-17 — End: 2016-07-19
  Administered 2016-07-17 – 2016-07-19 (×2): 0.25 ug via ORAL
  Filled 2016-07-16 (×2): qty 1

## 2016-07-16 MED ORDER — LORATADINE 10 MG PO TABS
10.0000 mg | ORAL_TABLET | Freq: Every day | ORAL | Status: DC
Start: 2016-07-17 — End: 2016-07-19
  Administered 2016-07-17 – 2016-07-19 (×3): 10 mg via ORAL
  Filled 2016-07-16 (×3): qty 1

## 2016-07-16 MED ORDER — TAMSULOSIN HCL 0.4 MG PO CAPS
0.4000 mg | ORAL_CAPSULE | Freq: Every day | ORAL | Status: DC
  Administered 2016-07-17 – 2016-07-18 (×2): 0.4 mg via ORAL
  Filled 2016-07-16 (×2): qty 1

## 2016-07-16 MED ORDER — ACETAMINOPHEN 325 MG PO TABS: 650.0000 mg | ORAL_TABLET | Freq: Four times a day (QID) | ORAL | Status: DC | PRN

## 2016-07-16 MED ORDER — TIOTROPIUM BROMIDE MONOHYDRATE 18 MCG IN CAPS
18.0000 ug | ORAL_CAPSULE | Freq: Every day | RESPIRATORY_TRACT | Status: DC
  Administered 2016-07-17 – 2016-07-19 (×3): 18 ug via RESPIRATORY_TRACT
  Filled 2016-07-16: qty 5

## 2016-07-16 MED ORDER — VITAMIN D 1000 UNITS PO TABS
1000.0000 [IU] | ORAL_TABLET | Freq: Every day | ORAL | Status: DC
  Administered 2016-07-17 – 2016-07-19 (×3): 1000 [IU] via ORAL
  Filled 2016-07-16 (×3): qty 1

## 2016-07-16 MED ORDER — HYDROXYCHLOROQUINE SULFATE 200 MG PO TABS
200.0000 mg | ORAL_TABLET | Freq: Every day | ORAL | Status: DC
  Administered 2016-07-17 – 2016-07-19 (×3): 200 mg via ORAL
  Filled 2016-07-16 (×3): qty 1

## 2016-07-16 MED ORDER — ONDANSETRON HCL 4 MG/2ML IJ SOLN: 4.0000 mg | Freq: Four times a day (QID) | INTRAMUSCULAR | Status: DC | PRN

## 2016-07-16 MED ORDER — ASPIRIN EC 81 MG PO TBEC
81.0000 mg | DELAYED_RELEASE_TABLET | Freq: Every day | ORAL | Status: DC
  Administered 2016-07-17 – 2016-07-19 (×3): 81 mg via ORAL
  Filled 2016-07-16 (×3): qty 1

## 2016-07-16 MED ORDER — CARVEDILOL 6.25 MG PO TABS
6.2500 mg | ORAL_TABLET | Freq: Two times a day (BID) | ORAL | Status: DC
  Administered 2016-07-17 – 2016-07-19 (×6): 6.25 mg via ORAL
  Filled 2016-07-16 (×6): qty 1

## 2016-07-16 MED ORDER — SODIUM CHLORIDE 0.9 % IV SOLN: 250.0000 mL | INTRAVENOUS | Status: DC | PRN

## 2016-07-16 MED ORDER — ISOSORBIDE MONONITRATE ER 30 MG PO TB24
30.0000 mg | ORAL_TABLET | Freq: Every day | ORAL | Status: DC
  Administered 2016-07-17 – 2016-07-19 (×3): 30 mg via ORAL
  Filled 2016-07-16 (×3): qty 1

## 2016-07-16 MED ORDER — INSULIN ASPART 100 UNIT/ML ~~LOC~~ SOLN: 0.0000 [IU] | Freq: Every day | SUBCUTANEOUS | Status: DC

## 2016-07-16 MED ORDER — HYDROCODONE-ACETAMINOPHEN 5-325 MG PO TABS: 1.0000 | ORAL_TABLET | Freq: Every day | ORAL | Status: DC | PRN

## 2016-07-16 MED ORDER — FERROUS SULFATE 325 (65 FE) MG PO TABS
325.0000 mg | ORAL_TABLET | Freq: Every day | ORAL | Status: DC
  Administered 2016-07-17 – 2016-07-19 (×3): 325 mg via ORAL
  Filled 2016-07-16 (×3): qty 1

## 2016-07-16 MED ORDER — INSULIN GLARGINE 100 UNIT/ML ~~LOC~~ SOLN
35.0000 [IU] | Freq: Every day | SUBCUTANEOUS | Status: DC
  Administered 2016-07-17: 35 [IU] via SUBCUTANEOUS
  Filled 2016-07-16 (×2): qty 0.35

## 2016-07-16 MED ORDER — OCUVITE-LUTEIN PO CAPS
1.0000 | ORAL_CAPSULE | Freq: Two times a day (BID) | ORAL | Status: DC
  Administered 2016-07-17 – 2016-07-19 (×6): 1 via ORAL
  Filled 2016-07-16 (×6): qty 1

## 2016-07-16 MED ORDER — HYDROCODONE-ACETAMINOPHEN 5-325 MG PO TABS: 1.0000 | ORAL_TABLET | Freq: Every day | ORAL | 0 refills | Status: DC | PRN

## 2016-07-16 NOTE — ED Triage Notes (Addendum)
Patient presents to the ED from "[facility]".  Patient reports having increased shortness of breath with slight chest pain when taking deep breaths.  Patient reports increased swelling to feet.  Patient states, "sometimes when my hemoglobin is low I get short of breath."  Patient appears short of breath at this time.  Patient appears pale.
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
## 2016-07-16 NOTE — Progress Notes (Signed)
Pre visit review using our clinic review tool, if applicable. No additional management support is needed unless otherwise documented below in the visit note. 

## 2016-07-16 NOTE — Patient Instructions (Addendum)
Blood sugar log  CUT every sliding scale dose of insulin in HALF. We cannot have low's - we would rather blood sugar run high  Labs today however if any worsening of swelling or shortness of breath, you must to go to emergency room.  Follow up in one week
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
## 2016-07-16 NOTE — ED Notes (Signed)
Pt sitting on side of bed. Asked pt if he wanted to lay back but pt states laying back is uncomfortable.
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
## 2016-07-17 ENCOUNTER — Telehealth: Payer: Self-pay | Admitting: Family

## 2016-07-17 ENCOUNTER — Other Ambulatory Visit: Payer: Self-pay

## 2016-07-17 ENCOUNTER — Telehealth: Payer: Self-pay | Admitting: Internal Medicine

## 2016-07-17 DIAGNOSIS — I482 Chronic atrial fibrillation: Secondary | ICD-10-CM

## 2016-07-17 DIAGNOSIS — L8990 Pressure ulcer of unspecified site, unspecified stage: Secondary | ICD-10-CM | POA: Insufficient documentation

## 2016-07-17 DIAGNOSIS — I5033 Acute on chronic diastolic (congestive) heart failure: Secondary | ICD-10-CM

## 2016-07-17 DIAGNOSIS — I350 Nonrheumatic aortic (valve) stenosis: Secondary | ICD-10-CM

## 2016-07-17 DIAGNOSIS — R609 Edema, unspecified: Secondary | ICD-10-CM

## 2016-07-17 DIAGNOSIS — Z8719 Personal history of other diseases of the digestive system: Secondary | ICD-10-CM

## 2016-07-17 DIAGNOSIS — D649 Anemia, unspecified: Secondary | ICD-10-CM

## 2016-07-17 DIAGNOSIS — I639 Cerebral infarction, unspecified: Secondary | ICD-10-CM

## 2016-07-17 DIAGNOSIS — I2510 Atherosclerotic heart disease of native coronary artery without angina pectoris: Secondary | ICD-10-CM

## 2016-07-17 DIAGNOSIS — R0603 Acute respiratory distress: Secondary | ICD-10-CM | POA: Diagnosis present

## 2016-07-17 DIAGNOSIS — R600 Localized edema: Secondary | ICD-10-CM

## 2016-07-17 LAB — GLUCOSE, CAPILLARY
GLUCOSE-CAPILLARY: 294 mg/dL — AB (ref 65–99)
Glucose-Capillary: 286 mg/dL — ABNORMAL HIGH (ref 65–99)
Glucose-Capillary: 31 mg/dL — CL (ref 65–99)
Glucose-Capillary: 40 mg/dL — CL (ref 65–99)
Glucose-Capillary: 128 mg/dL — ABNORMAL HIGH (ref 65–99)
Glucose-Capillary: 158 mg/dL — ABNORMAL HIGH (ref 65–99)
Glucose-Capillary: 162 mg/dL — ABNORMAL HIGH (ref 65–99)

## 2016-07-17 LAB — COMPREHENSIVE METABOLIC PANEL
ALT: 14 U/L (ref 0–53)
AST: 24 U/L (ref 0–37)
Albumin: 3.6 g/dL (ref 3.5–5.2)
Alkaline Phosphatase: 115 U/L (ref 39–117)
BILIRUBIN TOTAL: 0.7 mg/dL (ref 0.2–1.2)
BUN: 51 mg/dL — AB (ref 6–23)
CO2: 29 meq/L (ref 19–32)
Calcium: 9 mg/dL (ref 8.4–10.5)
Chloride: 99 mEq/L (ref 96–112)
Creatinine, Ser: 2.3 mg/dL — ABNORMAL HIGH (ref 0.40–1.50)
GFR: 28.73 mL/min — AB (ref >60.00)
GLUCOSE: 124 mg/dL — AB (ref 70–99)
Potassium: 5 mEq/L (ref 3.5–5.1)
SODIUM: 133 meq/L — AB (ref 135–145)
TOTAL PROTEIN: 6.7 g/dL (ref 6.0–8.3)

## 2016-07-17 LAB — IRON AND TIBC
IRON: 118 ug/dL (ref 45–182)
Saturation Ratios: 38 % (ref 17.9–39.5)
TIBC: 310 ug/dL (ref 250–450)
UIBC: 192 ug/dL

## 2016-07-17 LAB — CBC WITH DIFFERENTIAL/PLATELET
BASOS ABS: 0.1 10*3/uL (ref 0.0–0.1)
BASOS PCT: 1 %
Basophils Absolute: 0.1 10*3/uL (ref 0–0.1)
Basophils Relative: 1 % (ref 0.0–3.0)
EOS ABS: 0.4 10*3/uL (ref 0.0–0.7)
EOS ABS: 0.5 10*3/uL (ref 0–0.7)
EOS PCT: 9 %
Eosinophils Relative: 7 % — ABNORMAL HIGH (ref 0.0–5.0)
HCT: 22.8 % — CL (ref 39.0–52.0)
HCT: 27.1 % — ABNORMAL LOW (ref 40.0–52.0)
HEMOGLOBIN: 9 g/dL — AB (ref 13.0–18.0)
Hemoglobin: 7.3 g/dL — CL (ref 13.0–17.0)
LYMPHS ABS: 0.6 10*3/uL — AB (ref 0.7–4.0)
Lymphocytes Relative: 10.2 % — ABNORMAL LOW (ref 12.0–46.0)
Lymphocytes Relative: 11 %
Lymphs Abs: 0.6 10*3/uL — ABNORMAL LOW (ref 1.0–3.6)
MCH: 27.6 pg (ref 26.0–34.0)
MCHC: 31.9 g/dL (ref 30.0–36.0)
MCHC: 33 g/dL (ref 32.0–36.0)
MCV: 85.2 fl (ref 78.0–100.0)
MCV: 83.4 fL (ref 80.0–100.0)
MONO ABS: 0.8 10*3/uL (ref 0.1–1.0)
MONO ABS: 0.7 10*3/uL (ref 0.2–1.0)
MONOS PCT: 13 %
Monocytes Relative: 14 % — ABNORMAL HIGH (ref 3.0–12.0)
NEUTROS ABS: 4 10*3/uL (ref 1.4–7.7)
NEUTROS PCT: 67.8 % (ref 43.0–77.0)
NEUTROS PCT: 66 %
Neutro Abs: 3.9 10*3/uL (ref 1.4–6.5)
PLATELETS: 226 10*3/uL (ref 150.0–400.0)
PLATELETS: 206 10*3/uL (ref 150–440)
RBC: 2.68 Mil/uL — AB (ref 4.22–5.81)
RBC: 3.25 MIL/uL — ABNORMAL LOW (ref 4.40–5.90)
RDW: 16 % — ABNORMAL HIGH (ref 11.5–15.5)
RDW: 17.4 % — AB (ref 11.5–14.5)
WBC: 5.8 10*3/uL (ref 4.0–10.5)
WBC: 5.8 10*3/uL (ref 3.8–10.6)

## 2016-07-17 LAB — BASIC METABOLIC PANEL
Anion gap: 8 (ref 5–15)
BUN: 48 mg/dL — ABNORMAL HIGH (ref 6–20)
CALCIUM: 8.6 mg/dL — AB (ref 8.9–10.3)
CO2: 27 mmol/L (ref 22–32)
CREATININE: 2.21 mg/dL — AB (ref 0.61–1.24)
Chloride: 100 mmol/L — ABNORMAL LOW (ref 101–111)
GFR calc non Af Amer: 25 mL/min — ABNORMAL LOW (ref >60)
GFR, EST AFRICAN AMERICAN: 29 mL/min — AB (ref >60)
Glucose, Bld: 217 mg/dL — ABNORMAL HIGH (ref 65–99)
Potassium: 4 mmol/L (ref 3.5–5.1)
Sodium: 135 mmol/L (ref 135–145)

## 2016-07-17 LAB — BRAIN NATRIURETIC PEPTIDE: Brain Natriuretic Peptide: 260.3 pg/mL — ABNORMAL HIGH (ref <100)

## 2016-07-17 LAB — TROPONIN I
TROPONIN I: 0.03 ng/mL — AB (ref <0.03)
Troponin I: 0.03 ng/mL (ref <0.03)

## 2016-07-17 LAB — TRANSFERRIN: Transferrin: 226 mg/dL (ref 180–329)

## 2016-07-17 LAB — FERRITIN: Ferritin: 79 ng/mL (ref 24–336)

## 2016-07-17 LAB — TSH: TSH: 4.105 u[IU]/mL (ref 0.350–4.500)

## 2016-07-17 MED ORDER — HEPARIN SODIUM (PORCINE) 5000 UNIT/ML IJ SOLN
5000.0000 [IU] | Freq: Two times a day (BID) | INTRAMUSCULAR | Status: DC
  Administered 2016-07-17 – 2016-07-18 (×2): 5000 [IU] via SUBCUTANEOUS
  Filled 2016-07-17 (×3): qty 1

## 2016-07-17 MED ORDER — DEXTROSE 50 % IV SOLN
INTRAVENOUS | Status: AC
  Filled 2016-07-17: qty 50

## 2016-07-17 MED ORDER — DEXTROSE 50 % IV SOLN
25.0000 mL | Freq: Once | INTRAVENOUS | Status: AC
  Administered 2016-07-17: 25 mL via INTRAVENOUS

## 2016-07-17 MED ORDER — IPRATROPIUM-ALBUTEROL 0.5-2.5 (3) MG/3ML IN SOLN: 3.0000 mL | Freq: Four times a day (QID) | RESPIRATORY_TRACT | Status: DC | PRN
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
## 2016-07-17 NOTE — Progress Notes (Signed)
ANTICOAGULATION CONSULT NOTE - Initial Consult  Pharmacy Consult for DVT prophylaxis Indication: DVT prophylaxis  Allergies  Allergen Reactions  . Asa [Aspirin] Other (See Comments)    Reaction:  Caused stomach ulcer patient can take 81 mg.  . Losartan Other (See Comments)    Decreased pulse rate  . Metoprolol Other (See Comments)    Reaction:  Bradycardia   . Penicillins Swelling and Other (See Comments)    Has patient had a PCN reaction causing immediate rash, facial/tongue/throat swelling, SOB or lightheadedness with hypotension: No Has patient had a PCN reaction causing severe rash involving mucus membranes or skin necrosis: No Has patient had a PCN reaction that required hospitalization No Has patient had a PCN reaction occurring within the last 10 years: No If all of the above answers are "NO", then may proceed with Cephalosporin use.    Patient Measurements: Height: 5\' 10"  (177.8 cm) Weight: 246 lb 6.2 oz (111.8 kg) IBW/kg (Calculated) : 73 Heparin Dosing Weight:   Vital Signs: Temp: 98 F (36.7 C) (02/07 1148) Temp Source: Oral (02/07 1148) BP: 121/46 (02/07 1148) Pulse Rate: 63 (02/07 1148)  Labs:  Recent Labs  07/16/16 1541  07/16/16 1802 07/17/16 0006 07/17/16 0344 07/17/16 1134  HGB 7.3 Repeated and verified X2.*  --  7.6*  --  9.0*  --   HCT 22.8 Repeated and verified X2.*  --  23.8*  --  27.1*  --   PLT 226.0  --  218  --  206  --   CREATININE 2.30*  --  2.35*  --  2.21*  --   TROPONINI  --   < > 0.03* 0.03* 0.03* <0.03  < > = values in this interval not displayed.  Estimated Creatinine Clearance: 29.5 mL/min (by C-G formula based on SCr of 2.21 mg/dL (H)).   Medical History: Past Medical History:  Diagnosis Date  . Anemia   . Aortic stenosis, severe    a. s/p bioprosthetic aortic valve replacement in 2009  . BPH (benign prostatic hypertrophy)   . CAD (coronary artery disease)    a. s/p CABG x 1 in 2009  . Chronic diastolic (congestive)  heart failure   . CVA (cerebral infarction) 06/2014   Right MCA infarct  . Diabetes mellitus   . Gastrointestinal bleed    a. reccurent GIB  . History of bladder cancer   . Hyperlipidemia   . Hypertension   . Junctional bradycardia   . Macular degeneration   . Mobitz type 1 second degree atrioventricular block 10/01/2012  . OA (osteoarthritis)   . Permanent atrial fibrillation ([place])    a. s/p Watchman device 08/10/2015; b. on Eliquis    Medications:  Scheduled:  . aspirin EC  81 mg Oral Daily  . calcitRIOL  0.25 mcg Oral Once per day on Mon Wed Fri  . carvedilol  6.25 mg Oral BID  . cholecalciferol  1,000 Units Oral Daily  . escitalopram  10 mg Oral Daily  . ferrous sulfate  325 mg Oral Q breakfast  . furosemide  40 mg Intravenous Daily  . heparin subcutaneous  5,000 Units Subcutaneous Q12H  . hydroxychloroquine  200 mg Oral Daily  . insulin aspart  0-20 Units Subcutaneous TID WC  . insulin aspart  0-5 Units Subcutaneous QHS  . insulin glargine  35 Units Subcutaneous QHS  . isosorbide mononitrate  30 mg Oral Daily  . loratadine  10 mg Oral Daily  . multivitamin with minerals  1 tablet  Oral Daily  . multivitamin-lutein  1 capsule Oral BID  . pantoprazole  40 mg Oral Daily  . sodium chloride flush  3 mL Intravenous Q12H  . tamsulosin  0.4 mg Oral QPC supper  . tiotropium  18 mcg Inhalation Daily  . vitamin C  500 mg Oral Daily    Assessment: Patient admitted for acute CHF exacerbation  Goal of Therapy:  Monitor platelets by anticoagulation protocol: Yes   Plan:  Will d/c lovenox since pt's CrCl < 30 ml/min and will start heparin 5000 units q12h for dvt prophyalxis since Hgb 7.6 - 9.0 Will continue to monitor Hgb and plts.  Tobie Lords, PharmD, BCPS Clinical Pharmacist 07/17/2016

## 2016-07-17 NOTE — Evaluation (Signed)
Physical Therapy Evaluation Patient Details [name] MRN: [id number] DOB: 05-Nov-1927 Today's Date: 07/17/2016   History of Present Illness  83 year old male who was admitted for CHF. Pt with history of anemia, CAD s/p CABG, GI bleed, and HTN. Pt with complaints of SOB and LE edema. Pt reports when he is anemic, he becomes SOB. 2 units of RBC given yesterday.   Clinical Impression  Pt is a pleasant 83 year old male who was admitted for CHF. Pt performs bed mobility with independence, transfers with mod I, and ambulation with cga and RW. Pt demonstrates deficits with balance/strength/endurance. Pt is getting closer to baseline level. Very pleasant to work with and pt motivated to perform therapy. Would benefit from skilled PT to address above deficits and promote optimal return to PLOF. Recommend transition to [facility] upon discharge from acute hospitalization.       Follow Up Recommendations Home health PT (wishes to keep advanced home care and same PT)    Equipment Recommendations  None recommended by PT    Recommendations for Other Services       Precautions / Restrictions Precautions Precautions: Fall Restrictions Weight Bearing Restrictions: No      Mobility  Bed Mobility Overal bed mobility: Independent             General bed mobility comments: safe technique performed  Transfers Overall transfer level: Modified independent Equipment used: Rolling walker (2 wheeled)             General transfer comment: safe technique performed with RW. Upright posture noted  Ambulation/Gait Ambulation/Gait assistance: Min guard Ambulation Distance (Feet): 120 Feet Assistive device: Rolling walker (2 wheeled) Gait Pattern/deviations: Step-through pattern     General Gait Details: ambulated using RW with upright posture. Pt with shuffling gait pattern, however no overt LOB noted.  Stairs            Wheelchair Mobility    Modified Rankin (Stroke  Patients Only)       Balance Overall balance assessment: Needs assistance Sitting-balance support: Feet supported Sitting balance-[name]: Normal     Standing balance support: Bilateral upper extremity supported Standing balance-[name]: Good                               Pertinent Vitals/Pain Pain Assessment: No/denies pain    Home Living Family/patient expects to be discharged to:: Private residence Living Arrangements: Alone Available Help at Discharge: Personal care attendant (comes 7 days/wk to assist with ADLs) Type of Home: House Home Access: Stairs to enter Entrance Stairs-Rails: Psychiatric nurse of Steps: 3 Home Layout: Two level Home Equipment: Walker - 2 wheels;Walker - 4 wheels      Prior Function Level of Independence: Independent with assistive device(s)         Comments: Pt uses RW for all mobility and rollater for community distances     Hand Dominance        Extremity/Trunk Assessment   Upper Extremity Assessment Upper Extremity Assessment: Overall WFL for tasks assessed    Lower Extremity Assessment Lower Extremity Assessment: Generalized weakness (B LE grossly 4+/5)       Communication   Communication: No difficulties  Cognition Arousal/Alertness: Awake/alert Behavior During Therapy: WFL for tasks assessed/performed Overall Cognitive Status: Within Functional Limits for tasks assessed  General Comments      Exercises     Assessment/Plan    PT Assessment Patient needs continued PT services  PT Problem List Decreased strength;Decreased mobility;Decreased balance          PT Treatment Interventions Gait training;DME instruction;Therapeutic exercise    PT Goals (Current goals can be found in the Care Plan section)  Acute Rehab PT Goals Patient Stated Goal: to go home tomorrow PT Goal Formulation: With patient Time For Goal Achievement: 07/31/16 Potential to  Achieve Goals: Good    Frequency Min 2X/week   Barriers to discharge        Co-evaluation               End of Session Equipment Utilized During Treatment: Gait belt Activity Tolerance: Patient tolerated treatment well Patient left: in bed;with bed alarm set Nurse Communication: Mobility status         Time: 1347-1406 PT Time Calculation (min) (ACUTE ONLY): 19 min   Charges:   PT Evaluation $PT Eval Moderate Complexity: 1 Procedure     PT G Codes:        [name] 07/29/2016, 3:10 PM  [name], PT, DPT [phone]

## 2016-07-17 NOTE — Assessment & Plan Note (Signed)
Chronic. Pending CBC. Suspect also contributing to SOB.

## 2016-07-17 NOTE — Telephone Encounter (Signed)
critical   7.3 hemoglobin. 22.8 hematocrit.

## 2016-07-17 NOTE — Telephone Encounter (Signed)
Spoke with son and pt went to hospital last night.

## 2016-07-17 NOTE — Assessment & Plan Note (Addendum)
Weight is up 13 pounds. Sa02 98. Orthopnea. Increased SOB. No rales. Concern for exacerbation. 80mg  PO lasix is not improving swelling. Discussed with patient that I was willing to look at [facility], BNP however if acute abnormalities, I would advise him to go to the ED as likely he would need closing monitoring and IV diuresis.

## 2016-07-17 NOTE — Care Management (Addendum)
Patient admitted from home with CHF.  He is currently followed by [facility] Nurse, PT and OT.  Agency is aware of admission.  he was admitted with congestive heart faliure and anemia with hgb 7.3. Required transfusion. Is not requiring oxygen.

## 2016-07-17 NOTE — Progress Notes (Signed)
[name], [name] Coffee 83 y/o male.Patient was transferred from ER after receiving 1 unit of PRBC. . On admission , patient was A&O X4, denied being in pain and stated that he feels better. Patient was oriented to his room, skin checked with another RN: patient has a healing scab on his head, stage sacral ulcer,and stage 1 ulcer on his right big toe. Skin intervention performed according to protocol,  2350: Second unit of blood picked up from transfusion service. Blood product checked with another RN. Patient was educated about possible  blood transfusion reactions. Blood was transfused according to policy. Patient tolerated blood transfusion without any reaction. Patient rested well and was assisted as needed .

## 2016-07-17 NOTE — Progress Notes (Signed)
Hypoglycemic Event  CBG: 31  Treatment: 4 oz orange juice   Symptoms: none   Follow-up CBG: Time:1711 CBG Result:47  Possible Reasons for Event: medications  Comments/MD notified: Dr. Posey Pronto notified orders received for half amp of d50    [name]

## 2016-07-17 NOTE — Assessment & Plan Note (Addendum)
2 pressure ulcers noted. Measurements taken. No signs of infection. Advised to elevated left foot from bed, wear proper shoes. Advised barrier cream to buttocks and to ensure patient is not sitting in same spot. Wound care orders placed by pcp.

## 2016-07-17 NOTE — Assessment & Plan Note (Signed)
Chronic. Stable.  Refilled norco for 30 day supply.

## 2016-07-17 NOTE — Assessment & Plan Note (Signed)
Very concerned about hypoglycemia. Education provided on the importance of regular meals. I advised that I was less concerned with moderately high blood sugars and MORE concerned with lows. Advised him to only give SSI when he has a protein meal AND to cut the SSI numbers in half. Caregiver and patient understood this. He will call immediately if he has any more low episodes.

## 2016-07-17 NOTE — Progress Notes (Signed)
[name] at [name] [name]    MR#:  [id number]  DATE OF BIRTH:  07-04-1927  SUBJECTIVE:  Came in with increasing shortness of breath and weight gain and significant leg edema. Patient felt better after 2 units of blood transfusion. He seems to think his main issue is his leg edema.  REVIEW OF SYSTEMS:   Review of Systems  Constitutional: Negative for chills, fever and weight loss.  HENT: Negative for ear discharge, ear pain and nosebleeds.   Eyes: Negative for blurred vision, pain and discharge.  Respiratory: Positive for shortness of breath. Negative for sputum production, wheezing and stridor.   Cardiovascular: Positive for leg swelling and PND. Negative for chest pain, palpitations and orthopnea.  Gastrointestinal: Negative for abdominal pain, diarrhea, nausea and vomiting.  Genitourinary: Negative for frequency and urgency.  Musculoskeletal: Negative for back pain and joint pain.  Neurological: Positive for weakness. Negative for sensory change, speech change and focal weakness.  Psychiatric/Behavioral: Negative for depression and hallucinations. The patient is not nervous/anxious.    Tolerating Diet:yes Tolerating PT: pending  DRUG ALLERGIES:   Allergies  Allergen Reactions  . Asa [Aspirin] Other (See Comments)    Reaction:  Caused stomach ulcer patient can take 81 mg.  . Losartan Other (See Comments)    Decreased pulse rate  . Metoprolol Other (See Comments)    Reaction:  Bradycardia   . Penicillins Swelling and Other (See Comments)    Has patient had a PCN reaction causing immediate rash, facial/tongue/throat swelling, SOB or lightheadedness with hypotension: No Has patient had a PCN reaction causing severe rash involving mucus membranes or skin necrosis: No Has patient had a PCN reaction that required hospitalization No Has patient had a PCN reaction occurring within the last 10 years: No If all of the  above answers are "NO", then may proceed with Cephalosporin use.    VITALS:  Blood pressure (!) 121/46, pulse 63, temperature 98 F (36.7 C), temperature source Oral, resp. rate 18, height 5\' 10"  (1.778 m), weight 111.8 kg (246 lb 6.2 oz), SpO2 95 %.  PHYSICAL EXAMINATION:   Physical Exam  GENERAL:  83 y.o.-year-old patient lying in the bed with no acute distress.  EYES: Pupils equal, round, reactive to light and accommodation. No scleral icterus. Extraocular muscles intact.  HEENT: Head atraumatic, normocephalic. Oropharynx and nasopharynx clear.  NECK:  Supple, no jugular venous distention. No thyroid enlargement, no tenderness.  LUNGS: Decreased breath sounds breath sounds bilaterally, no wheezing, rales, rhonchi. No use of accessory muscles of respiration.  CARDIOVASCULAR: S1, S2 normal. No murmurs, rubs, or gallops.  ABDOMEN: Soft, nontender, nondistended. Bowel sounds present. No organomegaly or mass.  EXTREMITIES: No cyanosis, clubbing  -Severe leg edema bilateral NEUROLOGIC: Cranial nerves II through XII are intact. No focal Motor or sensory deficits b/l.   PSYCHIATRIC:  patient is alert and oriented x 3.  SKIN: No obvious rash, lesion, or ulcer.   LABORATORY PANEL:  CBC  Recent Labs Lab 07/17/16 0344  WBC 5.8  HGB 9.0*  HCT 27.1*  PLT 206    Chemistries   Recent Labs Lab 07/16/16 1541  07/17/16 0344  NA 133*  < > 135  K 5.0  < > 4.0  CL 99  < > 100*  CO2 29  < > 27  GLUCOSE 124*  < > 217*  BUN 51*  < > 48*  CREATININE 2.30*  < > 2.21*  CALCIUM 9.0  < >  8.6*  AST 24  --   --   ALT 14  --   --   ALKPHOS 115  --   --   BILITOT 0.7  --   --   < > = values in this interval not displayed. Cardiac Enzymes  Recent Labs Lab 07/17/16 1134  TROPONINI <0.03   RADIOLOGY:  Dg Chest 2 View  Result Date: 07/16/2016 CLINICAL DATA:  Shortness of breath, slight chest pain with deep inspiration, swelling in feet, history hypertension, diabetes mellitus, coronary  artery disease, CHF, atrial fibrillation EXAM: CHEST  2 VIEW COMPARISON:  06/03/2016 FINDINGS: Enlargement of cardiac silhouette post median sternotomy and AVR. Atherosclerotic calcification aorta. Slight pulmonary vascular congestion. Interstitial prominence in the mid to lower lungs suggesting mild failure. RIGHT pleural effusion, small. No segmental consolidation or pneumothorax. Bones demineralized. IMPRESSION: Enlargement of cardiac silhouette with pulmonary vascular congestion, small RIGHT pleural effusion, and suspect mild pulmonary edema. Post AVR. Aortic atherosclerosis. Electronically Signed   By: Lavonia Dana M.D.   On: 07/16/2016 18:29   ASSESSMENT AND PLAN:  1. Acute respiratory distress: -Likely multifactorial including acute on chronic diastolic CHF and severe anemia with HGB 7.3 requiring 2 units of pRBC  2. Acute on chronic diastolic CHF: -Has diuresed well with IV lasix, continue for the next couple of days -Patient received 2 units of red blood cells, will need IV diuresis  -Per cardiology eval Consider changing to PO torsemide prior to discharge -Continue Coreg  3. Anemia: -Breathing is much improved s/p blood transfusion    -Has history of GI bleed -Status post 2 unit blood transfusion -Came in with hemoglobin of 7.1-----2 unit----9.6 -Patient follows with Dr. Mike Gip and gets Procrit as outpatient  4. CAD as above: -No symptoms concerning for angina -On ASA, continue for now as long as hgb remains stable -No plans for inpatient ischemia evaluation  5. Chronic Afib: -Rate controlled -Status post Watchman device in 08/2015 -ASA as above  6. CKD stage III: -Stable -Monitor with diuresis  Physical therapy to see  Case discussed with Care Management/Social Worker. Management plans discussed with the patient, family and they are in agreement.  CODE STATUS: full  DVT Prophylaxis: heparin  TOTAL TIME TAKING CARE OF THIS PATIENT: *25 minutes.  >50% time  spent on counselling and coordination of care  POSSIBLE D/C IN 1-2 DAYS, DEPENDING ON CLINICAL CONDITION.  Note: This dictation was prepared with Dragon dictation along with smaller phrase technology. Any transcriptional errors that result from this process are unintentional.  [name] M.D on 07/17/2016 at 2:53 PM  Between 7am to 6pm - Pager - [phone]  After 6pm go to [email or web address] - password [email or web address]  Office  [phone]  CC: Primary care physician; [name], MD

## 2016-07-17 NOTE — Progress Notes (Signed)
Patient had dinner and an amp of d50.  Recheck FSBG and it is 128.

## 2016-07-18 DIAGNOSIS — M7989 Other specified soft tissue disorders: Secondary | ICD-10-CM

## 2016-07-18 DIAGNOSIS — N183 Chronic kidney disease, stage 3 (moderate): Secondary | ICD-10-CM

## 2016-07-18 DIAGNOSIS — R262 Difficulty in walking, not elsewhere classified: Secondary | ICD-10-CM

## 2016-07-18 DIAGNOSIS — D631 Anemia in chronic kidney disease: Secondary | ICD-10-CM

## 2016-07-18 LAB — BASIC METABOLIC PANEL
ANION GAP: 8 (ref 5–15)
BUN: 48 mg/dL — ABNORMAL HIGH (ref 6–20)
CALCIUM: 8.9 mg/dL (ref 8.9–10.3)
CO2: 27 mmol/L (ref 22–32)
Chloride: 99 mmol/L — ABNORMAL LOW (ref 101–111)
Creatinine, Ser: 2.31 mg/dL — ABNORMAL HIGH (ref 0.61–1.24)
GFR calc non Af Amer: 24 mL/min — ABNORMAL LOW (ref >60)
GFR, EST AFRICAN AMERICAN: 28 mL/min — AB (ref >60)
Glucose, Bld: 164 mg/dL — ABNORMAL HIGH (ref 65–99)
Potassium: 4.2 mmol/L (ref 3.5–5.1)
SODIUM: 134 mmol/L — AB (ref 135–145)

## 2016-07-18 LAB — GLUCOSE, CAPILLARY
GLUCOSE-CAPILLARY: 158 mg/dL — AB (ref 65–99)
GLUCOSE-CAPILLARY: 289 mg/dL — AB (ref 65–99)
GLUCOSE-CAPILLARY: 291 mg/dL — AB (ref 65–99)
Glucose-Capillary: 220 mg/dL — ABNORMAL HIGH (ref 65–99)

## 2016-07-18 LAB — TYPE AND SCREEN
BLOOD PRODUCT EXPIRATION DATE: 201802152359
BLOOD PRODUCT EXPIRATION DATE: 201802152359
BLOOD PRODUCT EXPIRATION DATE: 201802282359
BLOOD PRODUCT EXPIRATION DATE: 201802282359
ISSUE DATE / TIME: 201802062042
ISSUE DATE / TIME: 201802062351
UNIT TYPE AND RH: 5100
UNIT TYPE AND RH: 5100
Unit Type and Rh: 5100
Unit Type and Rh: 5100

## 2016-07-18 LAB — HEMOGLOBIN: Hemoglobin: 9.3 g/dL — ABNORMAL LOW (ref 13.0–18.0)

## 2016-07-18 MED ORDER — EPOETIN ALFA 10000 UNIT/ML IJ SOLN
10000.0000 [IU] | Freq: Once | INTRAMUSCULAR | Status: AC
  Administered 2016-07-18: 10000 [IU] via SUBCUTANEOUS
  Filled 2016-07-18: qty 1

## 2016-07-18 MED ORDER — INSULIN ASPART 100 UNIT/ML ~~LOC~~ SOLN
0.0000 [IU] | Freq: Three times a day (TID) | SUBCUTANEOUS | Status: DC
  Administered 2016-07-18 (×2): 5 [IU] via SUBCUTANEOUS
  Administered 2016-07-19 (×2): 9 [IU] via SUBCUTANEOUS
  Filled 2016-07-18 (×2): qty 9
  Filled 2016-07-18 (×2): qty 5

## 2016-07-18 NOTE — Progress Notes (Signed)
Progress Note  Patient [name] Date of Encounter: 07/18/2016  Primary Cardiologist: Rockey Situ, tim  Subjective   Lethargic this morning, short of breath at rest. Still with leg swelling Appetite okay, no abdominal distention or discomfort. Has not been walking very much secondary to weakness, shortness of breath. Discussed lab work finding with him in detail   Inpatient Medications    Scheduled Meds: . aspirin EC  81 mg Oral Daily  . calcitRIOL  0.25 mcg Oral Once per day on Mon Wed Fri  . carvedilol  6.25 mg Oral BID  . cholecalciferol  1,000 Units Oral Daily  . escitalopram  10 mg Oral Daily  . ferrous sulfate  325 mg Oral Q breakfast  . furosemide  40 mg Intravenous Daily  . heparin subcutaneous  5,000 Units Subcutaneous Q12H  . hydroxychloroquine  200 mg Oral Daily  . insulin aspart  0-9 Units Subcutaneous TID WC  . isosorbide mononitrate  30 mg Oral Daily  . loratadine  10 mg Oral Daily  . multivitamin with minerals  1 tablet Oral Daily  . multivitamin-lutein  1 capsule Oral BID  . pantoprazole  40 mg Oral Daily  . sodium chloride flush  3 mL Intravenous Q12H  . tamsulosin  0.4 mg Oral QPC supper  . tiotropium  18 mcg Inhalation Daily  . vitamin C  500 mg Oral Daily   Continuous Infusions:  PRN Meds: sodium chloride, acetaminophen, ALPRAZolam, HYDROcodone-acetaminophen, ipratropium-albuterol, ondansetron (ZOFRAN) IV, sodium chloride flush, zolpidem   Vital Signs    Vitals:   07/17/16 1148 07/17/16 2003 07/18/16 0459 07/18/16 0917  BP: (!) 121/46 (!) 156/64 (!) 144/54 (!) 169/69  Pulse: 63 68 72 69  Resp: 18 18 18    Temp: 98 F (36.7 C) 98 F (36.7 C) 98 F (36.7 C)   TempSrc: Oral Oral Oral   SpO2: 95% 94% 93%   Weight:   248 lb 12.8 oz (112.9 kg)   Height:        Intake/Output Summary (Last 24 hours) at 07/18/16 1002 Last data filed at 07/18/16 0935  Gross per 24 hour  Intake              363 ml  Output             1250 ml  Net              -887 ml   Filed Weights   07/17/16 0000 07/17/16 0332 07/18/16 0459  Weight: 246 lb 6.2 oz (111.8 kg) 246 lb 6.2 oz (111.8 kg) 248 lb 12.8 oz (112.9 kg)    Telemetry    Atrial fibrillation, rate 60s  - Personally Reviewed  ECG      Physical Exam    GEN: No acute distress.  Neck: No JVD  Cardiac: RRR, no murmurs, rubs, or gallops.  Radials/DP/PT 2+ and equal bilaterally.  Respiratory:  Clear to auscultation bilaterally. GI: Soft, nontender, non-distended  MS: no deformity; no edema Neuro:  Alert and oriented x 3  Labs    Chemistry Recent Labs Lab 07/16/16 1541 07/16/16 1802 07/17/16 0344 07/18/16 0329  NA 133* 131* 135 134*  K 5.0 4.6 4.0 4.2  CL 99 97* 100* 99*  CO2 29 26 27 27   GLUCOSE 124* 160* 217* 164*  BUN 51* 51* 48* 48*  CREATININE 2.30* 2.35* 2.21* 2.31*  CALCIUM 9.0 8.6* 8.6* 8.9  PROT 6.7  --   --   --  ALBUMIN 3.6  --   --   --   AST 24  --   --   --   ALT 14  --   --   --   ALKPHOS 115  --   --   --   BILITOT 0.7  --   --   --   GFRNONAA  --  23* 25* 24*  GFRAA  --  27* 29* 28*  ANIONGAP  --  8 8 8      Hematology Recent Labs Lab 07/16/16 1541 07/16/16 1802 07/17/16 0344  WBC 5.8 5.4 5.8  RBC 2.68* 2.81* 3.25*  HGB 7.3 Repeated and verified X2.* 7.6* 9.0*  HCT 22.8 Repeated and verified X2.* 23.8* 27.1*  MCV 85.2 84.8 83.4  MCH  --  27.2 27.6  MCHC 31.9 32.1 33.0  RDW 16.0* 15.9* 17.4*  PLT 226.0 218 206    Cardiac Enzymes Recent Labs Lab 07/16/16 1802 07/17/16 0006 07/17/16 0344 07/17/16 1134  TROPONINI 0.03* 0.03* 0.03* <0.03   No results for input(s): TROPIPOC in the last 168 hours.   BNP Recent Labs Lab 07/16/16 1545 07/16/16 1802  BNP 260.3* 282.0*     DDimer No results for input(s): DDIMER in the last 168 hours.   Radiology    Dg Chest 2 View  Result Date: 07/16/2016 CLINICAL DATA:  Shortness of breath, slight chest pain with deep inspiration, swelling in feet, history hypertension, diabetes  mellitus, coronary artery disease, CHF, atrial fibrillation EXAM: CHEST  2 VIEW COMPARISON:  06/03/2016 FINDINGS: Enlargement of cardiac silhouette post median sternotomy and AVR. Atherosclerotic calcification aorta. Slight pulmonary vascular congestion. Interstitial prominence in the mid to lower lungs suggesting mild failure. RIGHT pleural effusion, small. No segmental consolidation or pneumothorax. Bones demineralized. IMPRESSION: Enlargement of cardiac silhouette with pulmonary vascular congestion, small RIGHT pleural effusion, and suspect mild pulmonary edema. Post AVR. Aortic atherosclerosis. Electronically Signed   By: Lavonia Dana M.D.   On: 07/16/2016 18:29    Cardiac Studies     Patient Profile     83 y.o. male with history of CAD s/p CABG x 1, chronic a-fib s/p Watchman, recurrent GI bleeds, AS s/p bioprosthetic AVR, and bladder cancer, admitted with worsening shortness of breath and leg edema.  He was seen by his PCP and was a found to have acute on chronic anemia with a hemoglobin of 7.3.  He received 2 units PRBCs in the ED as well as IV furosemide for SOB  Assessment & Plan    1. Acute respiratory distress: -Likely multifactorial including severe anemia with HGB 7.3 requiring 2 units of pRBC Also with acute on chronic diastolic CHF  Continues to be moderate to severely  Anemic contributing to his ongoing shortness of breath Also likely with continued fluid overload -Would continue Lasix IV with close monitoring of renal function  2. Acute on chronic diastolic CHF: Would continue lasix IV for now -Continue Coreg At the time of discharge we'll restart torsemide  3. Anemia:  History of GI blood loss, AV malformations, anemia from chronic renal disease, iron deficiency, Hematocrit 27 today, consider additional unit packed red blood cells he has such low reserve, high risk of readmission Procrit restarted November 2017  4. CAD as above: -No symptoms concerning for  angina -On ASA,  -No plans for inpatient ischemia evaluation  5. Chronic Afib: -Rate controlled -Status post Watchman device in 08/2015 -ASA 81 mg daily i 6. CKD stage III: -Monitor with diuresis Creatinine 2.5  7.  Aortic stenosis s/p AVR: -Outpatient follow up  Discussed the need for prbc with hospitalist service, Discussed update with family  Total encounter time more than 35 minutes  Greater than 50% was spent in counseling and coordination of care with the patient   Signed, Ida Rogue, MD  07/18/2016, 10:02 AM

## 2016-07-18 NOTE — Progress Notes (Signed)
PT Cancellation Note  Patient Details [name] MRN: [id number] DOB: Feb 22, 1928   Cancelled Treatment:    Reason Eval/Treat Not Completed: Fatigue/lethargy limiting ability to participate. Treatment attempted; pt refuses stating he is too tired. Per chart review, pt took medication to aid sleep last night and is affected today as well. Re attempt tomorrow.    Larae Grooms, PTA 07/18/2016, 5:48 PM

## 2016-07-18 NOTE — Progress Notes (Signed)
Inpatient Diabetes Program Recommendations  AACE/ADA: New Consensus Statement on Inpatient Glycemic Control (2015)  Target Ranges:  Prepandial:   less than 140 mg/dL      Peak postprandial:   less than 180 mg/dL (1-2 hours)      Critically ill patients:  140 - 180 mg/dL   Results for ZYGMUNT, KURKOWSKI (MRN [id number]) as of 07/18/2016 09:17  Ref. Range 07/17/2016 07:57 07/17/2016 11:44 07/17/2016 16:45 07/17/2016 17:05 07/17/2016 17:38 07/17/2016 20:24 07/17/2016 21:40 07/18/2016 07:21  Glucose-Capillary Latest Ref Range: 65 - 99 mg/dL 294 (H)  Novolog 11 units @9 :39  Lantus 35 units @9 :39 286 (H)  Novolog 11 units @ 12:24 31 (LL) 40 (LL) 128 (H) 158 (H) 162 (H) 158 (H)   Review of Glycemic Control  Diabetes history: DM2 Outpatient Diabetes medications: Lantus 35 units QHS, Novolog 5-7 units TID with meals Current orders for Inpatient glycemic control: Novolog 0-9 units TID with meals  Inpatient Diabetes Program Recommendations: Insulin - Basal: Noted patient received Lantus 35 units on 07/17/16 and it was discontinued due to glucose dropping to 31 mg/dl yesterdat at 16:45. Anticipate patient will need basal insulin and that hypoglycemia likely due to combination of receiving a total of Novolog 22 units (Novolog 11 units at 9:39 am and Novolog 11 units at 12:24) and perhaps too much basal insulin. Recommend reordering Lantus at lower dose; recommend ordering Lantus 11 units QHS (based on 112 kg x 0.1 units). Insulin-Correction: Please consider ordering Novolog 0-5 units QHS for bedtime correction.  Thanks, Barnie Alderman, RN, MSN, CDE Diabetes Coordinator Inpatient Diabetes Program [phone] (Team Pager from 8am to 5pm)

## 2016-07-18 NOTE — Progress Notes (Signed)
Follow-up appointment at the [facility] scheduled for July 25, 2016 at 12:30pm. Thank you.

## 2016-07-18 NOTE — Discharge Instructions (Signed)
[facility] Failure Clinic appointment on July 25, 2016 at 12:30pm with Darylene Price, [place]. Please call [phone] to reschedule.

## 2016-07-18 NOTE — Progress Notes (Signed)
Patient has been slightly lethargic today, easily arouses. Patient states they gave him a sleeping pill last night. Ambien documented on MAR at 0039 last night. Dr. Rockey Situ on unit and states to discontinue due to grogginess today.

## 2016-07-18 NOTE — Care Management (Signed)
Confirmed that patient does not have home 02.

## 2016-07-18 NOTE — Progress Notes (Signed)
[place] [facility] [facility] [place] [name]    MR#:  [id number]  DATE OF BIRTH:  July 21, 1927  SUBJECTIVE:  Came in with increasing shortness of breath and weight gain and significant leg edema. Patient felt better after 2 units of blood transfusion. He seems to think his main issue is his leg edema. Out in the chair this morning  REVIEW OF SYSTEMS:   Review of Systems  Constitutional: Negative for chills, fever and weight loss.  HENT: Negative for ear discharge, ear pain and nosebleeds.   Eyes: Negative for blurred vision, pain and discharge.  Respiratory: Positive for shortness of breath. Negative for sputum production, wheezing and stridor.   Cardiovascular: Positive for leg swelling and PND. Negative for chest pain, palpitations and orthopnea.  Gastrointestinal: Negative for abdominal pain, diarrhea, nausea and vomiting.  Genitourinary: Negative for frequency and urgency.  Musculoskeletal: Negative for back pain and joint pain.  Neurological: Positive for weakness. Negative for sensory change, speech change and focal weakness.  Psychiatric/Behavioral: Negative for depression and hallucinations. The patient is not nervous/anxious.    Tolerating Diet:yes Tolerating PT: HHPT to be resumed  DRUG ALLERGIES:   Allergies  Allergen Reactions  . Asa [Aspirin] Other (See Comments)    Reaction:  Caused stomach ulcer patient can take 81 mg.  . Losartan Other (See Comments)    Decreased pulse rate  . Metoprolol Other (See Comments)    Reaction:  Bradycardia   . Penicillins Swelling and Other (See Comments)    Has patient had a PCN reaction causing immediate rash, facial/tongue/throat swelling, SOB or lightheadedness with hypotension: No Has patient had a PCN reaction causing severe rash involving mucus membranes or skin necrosis: No Has patient had a PCN reaction that required hospitalization No Has patient had a PCN reaction occurring  within the last 10 years: No If all of the above answers are "NO", then may proceed with Cephalosporin use.    VITALS:  Blood pressure (!) 144/54, pulse 72, temperature 98 F (36.7 C), temperature source Oral, resp. rate 18, height 5\' 10"  (1.778 m), weight 112.9 kg (248 lb 12.8 oz), SpO2 93 %.  PHYSICAL EXAMINATION:   Physical Exam  GENERAL:  83 y.o.-year-old patient lying in the bed with no acute distress.  EYES: Pupils equal, round, reactive to light and accommodation. No scleral icterus. Extraocular muscles intact.  HEENT: Head atraumatic, normocephalic. Oropharynx and nasopharynx clear.  NECK:  Supple, no jugular venous distention. No thyroid enlargement, no tenderness.  LUNGS: Decreased breath sounds breath sounds bilaterally, no wheezing, rales, rhonchi. No use of accessory muscles of respiration.  CARDIOVASCULAR: S1, S2 normal. No murmurs, rubs, or gallops.  ABDOMEN: Soft, nontender, nondistended. Bowel sounds present. No organomegaly or mass.  EXTREMITIES: No cyanosis, clubbing  -++ leg edema bilateral NEUROLOGIC: Cranial nerves II through XII are intact. No focal Motor or sensory deficits b/l.   PSYCHIATRIC:  patient is alert and oriented x 3.  SKIN: No obvious rash, lesion, or ulcer.   LABORATORY PANEL:  CBC  Recent Labs Lab 07/17/16 0344  WBC 5.8  HGB 9.0*  HCT 27.1*  PLT 206    Chemistries   Recent Labs Lab 07/16/16 1541  07/18/16 0329  NA 133*  < > 134*  K 5.0  < > 4.2  CL 99  < > 99*  CO2 29  < > 27  GLUCOSE 124*  < > 164*  BUN 51*  < > 48*  CREATININE  2.30*  < > 2.31*  CALCIUM 9.0  < > 8.9  AST 24  --   --   ALT 14  --   --   ALKPHOS 115  --   --   BILITOT 0.7  --   --   < > = values in this interval not displayed. Cardiac Enzymes  Recent Labs Lab 07/17/16 1134  TROPONINI <0.03   RADIOLOGY:  Dg Chest 2 View  Result Date: 07/16/2016 CLINICAL DATA:  Shortness of breath, slight chest pain with deep inspiration, swelling in feet, history  hypertension, diabetes mellitus, coronary artery disease, CHF, atrial fibrillation EXAM: CHEST  2 VIEW COMPARISON:  06/03/2016 FINDINGS: Enlargement of cardiac silhouette post median sternotomy and AVR. Atherosclerotic calcification aorta. Slight pulmonary vascular congestion. Interstitial prominence in the mid to lower lungs suggesting mild failure. RIGHT pleural effusion, small. No segmental consolidation or pneumothorax. Bones demineralized. IMPRESSION: Enlargement of cardiac silhouette with pulmonary vascular congestion, small RIGHT pleural effusion, and suspect mild pulmonary edema. Post AVR. Aortic atherosclerosis. Electronically Signed   By: Lavonia Dana M.D.   On: 07/16/2016 18:29   ASSESSMENT AND PLAN:  1. Acute respiratory distress: -Likely multifactorial including acute on chronic diastolic CHF and severe anemia with HGB 7.3 requiring 2 units of pRBC  2. Acute on chronic diastolic CHF: -Has diuresed well with IV lasix, continue for the next couple of days -Patient received 2 units of red blood cells, will need IV diuresis  -Per cardiology eval Consider changing to PO torsemide prior to discharge -Continue Coreg -UOP 3200 cc since admisison -leg edema improving  3. Anemia: -Breathing is much improved s/p blood transfusion    -Has history of GI bleed -Status post 2 unit blood transfusion -Came in with hemoglobin of 7.1-----2 unit----9.6 -Patient follows with Dr. Mike Gip for his anemia  4. CAD as above: -No symptoms concerning for angina -On ASA, continue for now as long as hgb remains stable -No plans for inpatient ischemia evaluation  5. Chronic Afib: -Rate controlled -Status post Watchman device in 08/2015 -ASA as above  6. CKD stage III: -Stable -Monitor with diuresis  Physical therapy recommends resume HHPT  D/c tomorrow if continues to improve. D/w dr Rockey Situ  Case discussed with Care Management/Social Worker. Management plans discussed with the patient,  family and they are in agreement.  CODE STATUS: full  DVT Prophylaxis: heparin  TOTAL TIME TAKING CARE OF THIS PATIENT: *25 minutes.  >50% time spent on counselling and coordination of care  POSSIBLE D/C IN 1 DAYS, DEPENDING ON CLINICAL CONDITION.  Note: This dictation was prepared with Dragon dictation along with smaller phrase technology. Any transcriptional errors that result from this process are unintentional.  Saharsh Sterling M.D on 07/18/2016 at 8:43 AM  Between 7am to 6pm - Pager - [phone]  After 6pm go to [email or web address] - password [email or web address] [facility]  Office  [phone]  CC: Primary care physician; [name], MD

## 2016-07-19 ENCOUNTER — Telehealth: Payer: Self-pay | Admitting: Internal Medicine

## 2016-07-19 ENCOUNTER — Inpatient Hospital Stay: Payer: Medicare Other | Admitting: Hematology and Oncology

## 2016-07-19 ENCOUNTER — Inpatient Hospital Stay: Payer: Medicare Other

## 2016-07-19 DIAGNOSIS — L8990 Pressure ulcer of unspecified site, unspecified stage: Secondary | ICD-10-CM | POA: Insufficient documentation

## 2016-07-19 LAB — BASIC METABOLIC PANEL
ANION GAP: 11 (ref 5–15)
BUN: 54 mg/dL — ABNORMAL HIGH (ref 6–20)
CO2: 25 mmol/L (ref 22–32)
Calcium: 8.5 mg/dL — ABNORMAL LOW (ref 8.9–10.3)
Chloride: 95 mmol/L — ABNORMAL LOW (ref 101–111)
Creatinine, Ser: 2.28 mg/dL — ABNORMAL HIGH (ref 0.61–1.24)
GFR calc Af Amer: 28 mL/min — ABNORMAL LOW (ref >60)
GFR, EST NON AFRICAN AMERICAN: 24 mL/min — AB (ref >60)
GLUCOSE: 460 mg/dL — AB (ref 65–99)
POTASSIUM: 4.4 mmol/L (ref 3.5–5.1)
Sodium: 131 mmol/L — ABNORMAL LOW (ref 135–145)

## 2016-07-19 LAB — GLUCOSE, CAPILLARY
GLUCOSE-CAPILLARY: 436 mg/dL — AB (ref 65–99)
GLUCOSE-CAPILLARY: 433 mg/dL — AB (ref 65–99)
Glucose-Capillary: 410 mg/dL — ABNORMAL HIGH (ref 65–99)

## 2016-07-19 MED ORDER — TORSEMIDE 20 MG PO TABS
40.0000 mg | ORAL_TABLET | Freq: Every day | ORAL | Status: DC
  Administered 2016-07-19: 40 mg via ORAL
  Filled 2016-07-19: qty 2

## 2016-07-19 MED ORDER — MAGNESIUM HYDROXIDE 400 MG/5ML PO SUSP
30.0000 mL | Freq: Two times a day (BID) | ORAL | Status: DC
  Administered 2016-07-19: 30 mL via ORAL
  Filled 2016-07-19: qty 30

## 2016-07-19 MED ORDER — TORSEMIDE 20 MG PO TABS: 40.0000 mg | ORAL_TABLET | Freq: Every day | ORAL | 1 refills | Status: DC

## 2016-07-19 MED ORDER — INSULIN GLARGINE 100 UNIT/ML ~~LOC~~ SOLN
20.0000 [IU] | Freq: Every day | SUBCUTANEOUS | Status: DC
  Administered 2016-07-19: 20 [IU] via SUBCUTANEOUS
  Filled 2016-07-19: qty 0.2

## 2016-07-19 NOTE — Progress Notes (Signed)
Patient discharged home as ordered,instructions explained and well understood,follow up appointments made,priscription given,vital signs within normal limits,escorted by family and staff member via wheel chair.

## 2016-07-19 NOTE — Telephone Encounter (Signed)
Vivi Martens from [facility] called pt needs a 1 week HFU for acute CHF. Advised that we would call pt for appt. Thank you!

## 2016-07-19 NOTE — Discharge Summary (Signed)
[facility] [place] [name]    MR#:  [id number]  DATE OF BIRTH:  05-02-1928  DATE OF ADMISSION:  07/16/2016 ADMITTING PHYSICIAN: Harvie Bridge, DO  DATE OF DISCHARGE: 07/19/2016 12:52 PM  PRIMARY CARE PHYSICIAN: [name], MD    ADMISSION DIAGNOSIS:  Shortness of breath [R06.02] Peripheral edema [R60.9] Anemia, unspecified type [D64.9]  DISCHARGE DIAGNOSIS:  Principal Problem:   Acute respiratory distress Active Problems:   Nonrheumatic aortic valve stenosis   Coronary artery disease involving native coronary artery of native heart without angina pectoris   Chronic atrial fibrillation (HCC)   Anemia in chronic kidney disease   Cerebral infarction (HCC)   CKD (chronic kidney disease) stage 3, GFR 30-59 ml/min   Acute on chronic diastolic heart failure (HCC)   Swelling of lower extremity   History of GI bleed   Peripheral edema   Difficulty in walking, not elsewhere classified   Pressure injury of skin   SECONDARY DIAGNOSIS:   Past Medical History:  Diagnosis Date  . Anemia   . Aortic stenosis, severe    a. s/p bioprosthetic aortic valve replacement in 2009  . BPH (benign prostatic hypertrophy)   . CAD (coronary artery disease)    a. s/p CABG x 1 in 2009  . Chronic diastolic (congestive) heart failure   . CVA (cerebral infarction) 06/2014   Right MCA infarct  . Diabetes mellitus   . Gastrointestinal bleed    a. reccurent GIB  . History of bladder cancer   . Hyperlipidemia   . Hypertension   . Junctional bradycardia   . Macular degeneration   . Mobitz type 1 second degree atrioventricular block 10/01/2012  . OA (osteoarthritis)   . Permanent atrial fibrillation (HCC)    a. s/p Watchman device 08/10/2015; b. on Eliquis    HOSPITAL COURSE:   83 year old male with past medical history of severe aortic stenosis status post bioprosthetic aortic valve replacement, coronary artery disease status post  bypass, chronic diastolic CHF, history of previous CVA, diabetes, history of bladder cancer, hypertension, hyperlipidemia, history of first degree AV Block, atrial fibrillation who presented to the hospital due to shortness of breath.  1. Acute on chronic diastolic CHF-this was the cause of patient's shortness of breath on admission. -Patient was diuresed well with IV Lasix and responded well to it. Patient was seen by cardiology agreed with this management. -Patient has been on torsemide before as an outpatient and responded well to it and therefore not is being discharged on oral torsemide, carvedilol.  2. Anemia of chronic disease-patient does have a previous history of GI bleed. Had no acute bleeding while in the hospital. He follows up with Dr. Mike Gip as an outpatient. -His hemoglobin did drift down to as low as 7.1. He was symptomatic with shortness of breath and therefore was transfused total of 2 units of packed red blood cells. Hemoglobin on the day of discharge is 9.6 and stable.  3. History of coronary artery disease status post bypass-patient had no acute chest pains on the hospital. -He will continue his aspirin, beta blocker, statin.  4. Diabetes type 2 without complication-patient's blood sugars were somewhat labile but that was because patient was not on his scheduled Lantus. He will resume his Lantus and NovoLog sliding scale as mentioned below.  5. COPD-no acute exacerbation, patient will continue Spiriva.  6. History of atrial fibrillation-patient remained rate controlled and has converted to a sinus rhythm. Patient is  status post a watchman device. -Continue current carvedilol, aspirin  7. BPH-patient will continue his Flomax.   DISCHARGE CONDITIONS:   Stable.   CONSULTS OBTAINED:  Treatment Team:  Nelva Bush, MD Harvie Bridge, DO  DRUG ALLERGIES:   Allergies  Allergen Reactions  . Asa [Aspirin] Other (See Comments)    Reaction:  Caused stomach ulcer  patient can take 81 mg.  . Losartan Other (See Comments)    Decreased pulse rate  . Metoprolol Other (See Comments)    Reaction:  Bradycardia   . Penicillins Swelling and Other (See Comments)    Has patient had a PCN reaction causing immediate rash, facial/tongue/throat swelling, SOB or lightheadedness with hypotension: No Has patient had a PCN reaction causing severe rash involving mucus membranes or skin necrosis: No Has patient had a PCN reaction that required hospitalization No Has patient had a PCN reaction occurring within the last 10 years: No If all of the above answers are "NO", then may proceed with Cephalosporin use.    DISCHARGE MEDICATIONS:   Allergies as of 07/19/2016      Reactions   Asa [aspirin] Other (See Comments)   Reaction:  Caused stomach ulcer patient can take 81 mg.   Losartan Other (See Comments)   Decreased pulse rate   Metoprolol Other (See Comments)   Reaction:  Bradycardia    Penicillins Swelling, Other (See Comments)   Has patient had a PCN reaction causing immediate rash, facial/tongue/throat swelling, SOB or lightheadedness with hypotension: No Has patient had a PCN reaction causing severe rash involving mucus membranes or skin necrosis: No Has patient had a PCN reaction that required hospitalization No Has patient had a PCN reaction occurring within the last 10 years: No If all of the above answers are "NO", then may proceed with Cephalosporin use.      Medication List    STOP taking these medications   furosemide 80 MG tablet Commonly known as:  LASIX     TAKE these medications   acetaminophen 325 MG tablet Commonly known as:  TYLENOL Take 2 tablets (650 mg total) by mouth every 6 (six) hours as needed for mild pain (or Fever >/= 101).   aspirin EC 81 MG tablet Take 1 tablet (81 mg total) by mouth daily.   azelastine 0.05 % ophthalmic solution Commonly known as:  OPTIVAR Place 1 drop into the right eye 2 (two) times daily.   calcitRIOL  0.25 MCG capsule Commonly known as:  ROCALTROL TAKE ONE CAPSULE THREE TIMES A WEEK (MONDAY, WEDNESDAY, AND FRIDAY)   carvedilol 6.25 MG tablet Commonly known as:  COREG Take 1 tablet (6.25 mg total) by mouth 2 (two) times daily.   cholecalciferol 1000 units tablet Commonly known as:  VITAMIN D Take 1,000 Units by mouth daily.   escitalopram 10 MG tablet Commonly known as:  LEXAPRO TAKE 1 TABLET EVERY DAY WITH DINNER   ferrous sulfate 325 (65 FE) MG tablet Take 325 mg by mouth daily with breakfast.   HYDROcodone-acetaminophen 5-325 MG tablet Commonly known as:  NORCO/VICODIN Take 1 tablet by mouth daily as needed for moderate pain. DO NOT EXCEED 4GM OF APAP IN 24 HOURS FROM ALL SOURCES   hydroxychloroquine 200 MG tablet Commonly known as:  PLAQUENIL Take 200 mg by mouth daily.   insulin aspart 100 UNIT/ML injection Commonly known as:  novoLOG Inject 5-7 Units into the skin See admin instructions. Inject 5-7 units subcutaneously 3 times daily with meals per sliding scale: CBG 101-150 3  units, 151-200 4 units, 201-250 5 units, 251-300 7 units.   insulin glargine 100 UNIT/ML injection Commonly known as:  LANTUS Inject 0.35 mLs (35 Units total) into the skin at bedtime.   isosorbide mononitrate 30 MG 24 hr tablet Commonly known as:  IMDUR Take 30 mg by mouth daily.   loratadine 10 MG tablet Commonly known as:  CLARITIN Take 10 mg by mouth daily.   multivitamin with minerals Tabs tablet Take 1 tablet by mouth daily.   pantoprazole 40 MG tablet Commonly known as:  PROTONIX Take 1 tablet (40 mg total) by mouth daily.   PRESERVISION AREDS 2 Caps Take 1 capsule by mouth 2 (two) times daily.   tamsulosin 0.4 MG Caps capsule Commonly known as:  FLOMAX TAKE 1 CAPSULE BY MOUTH DAILY AFTER SUPPER   tiotropium 18 MCG inhalation capsule Commonly known as:  SPIRIVA Place 1 capsule (18 mcg total) into inhaler and inhale daily.   torsemide 20 MG tablet Commonly known as:   DEMADEX Take 2 tablets (40 mg total) by mouth daily.   vitamin C 500 MG tablet Commonly known as:  ASCORBIC ACID Take 500 mg by mouth daily.         DISCHARGE INSTRUCTIONS:   DIET:  Cardiac diet and Diabetic diet  DISCHARGE CONDITION:  Stable  ACTIVITY:  Activity as tolerated  OXYGEN:  Home Oxygen: No.   Oxygen Delivery: room air  DISCHARGE LOCATION:  Home with Home Health PT, OT, RN   If you experience worsening of your admission symptoms, develop shortness of breath, life threatening emergency, suicidal or homicidal thoughts you must seek medical attention immediately by calling 911 or calling your MD immediately  if symptoms less severe.  You Must read complete instructions/literature along with all the possible adverse reactions/side effects for all the Medicines you take and that have been prescribed to you. Take any new Medicines after you have completely understood and accpet all the possible adverse reactions/side effects.   Please note  You were cared for by a hospitalist during your hospital stay. If you have any questions about your discharge medications or the care you received while you were in the hospital after you are discharged, you can call the unit and asked to speak with the hospitalist on call if the hospitalist that took care of you is not available. Once you are discharged, your primary care physician will handle any further medical issues. Please note that NO REFILLS for any discharge medications will be authorized once you are discharged, as it is imperative that you return to your primary care physician (or establish a relationship with a primary care physician if you do not have one) for your aftercare needs so that they can reassess your need for medications and monitor your lab values.     Today   Feels better a bit constipated.  Shortness of breath much improved.  Wants to go home.   VITAL SIGNS:  Blood pressure (!) 151/64, pulse (!) 120,  temperature 99.8 F (37.7 C), temperature source Oral, resp. rate 18, height 5\' 10"  (1.778 m), weight 109.3 kg (240 lb 14.4 oz), SpO2 95 %.  I/O:   Intake/Output Summary (Last 24 hours) at 07/19/16 1621 Last data filed at 07/19/16 1200  Gross per 24 hour  Intake             1440 ml  Output             1550 ml  Net             -  110 ml    PHYSICAL EXAMINATION:  GENERAL:  83 y.o.-year-old patient lying in the bed in no acute distress.  EYES: Pupils equal, round, reactive to light and accommodation. No scleral icterus. Extraocular muscles intact.  HEENT: Head atraumatic, normocephalic. Oropharynx and nasopharynx clear.  NECK:  Supple, no jugular venous distention. No thyroid enlargement, no tenderness.  LUNGS: Normal breath sounds bilaterally, no wheezing, rales,rhonchi. No use of accessory muscles of respiration.  CARDIOVASCULAR: S1, S2 normal. No murmurs, rubs, or gallops.  ABDOMEN: Soft, non-tender, non-distended. Bowel sounds present. No organomegaly or mass.  EXTREMITIES: +1-2 pedal edema, cyanosis, or clubbing.  NEUROLOGIC: Cranial nerves II through XII are intact. No focal motor or sensory defecits b/l. Globally weak PSYCHIATRIC: The patient is alert and oriented x 3. Good affect.  SKIN: No obvious rash, lesion, or ulcer.   DATA REVIEW:   CBC  Recent Labs Lab 07/17/16 0344 07/18/16 0329  WBC 5.8  --   HGB 9.0* 9.3*  HCT 27.1*  --   PLT 206  --     Chemistries   Recent Labs Lab 07/16/16 1541  07/19/16 0611  NA 133*  < > 131*  K 5.0  < > 4.4  CL 99  < > 95*  CO2 29  < > 25  GLUCOSE 124*  < > 460*  BUN 51*  < > 54*  CREATININE 2.30*  < > 2.28*  CALCIUM 9.0  < > 8.5*  AST 24  --   --   ALT 14  --   --   ALKPHOS 115  --   --   BILITOT 0.7  --   --   < > = values in this interval not displayed.  Cardiac Enzymes  Recent Labs Lab 07/17/16 1134  TROPONINI <0.03   RADIOLOGY:  No results found.    Management plans discussed with the patient, family and  they are in agreement.  CODE STATUS:  Code Status History    Date Active Date Inactive Code Status Order ID Comments User Context   07/16/2016 11:39 PM 07/19/2016  3:53 PM Full Code [id number]  Harvie Bridge, DO Inpatient   06/19/2016  7:35 PM 06/21/2016  7:35 PM Partial Code [id number]  Theodoro Grist, MD ED   06/01/2016  6:21 PM 06/04/2016  6:52 PM Full Code [id number]  Shon Millet, DO Inpatient   05/08/2016  8:27 AM 05/16/2016  4:41 PM DNR [id number]  Everrett Coombe, MD Inpatient   05/02/2016  2:18 PM 05/08/2016  8:27 AM Full Code [id number]  Steve Rattler, DO Inpatient   05/02/2016  1:22 PM 05/02/2016  2:18 PM Partial Code [id number]  Elberta Leatherwood, MD ED   05/02/2016  1:12 PM 05/02/2016  1:22 PM DNR [id number]  Merrily Pew, MD ED   03/22/2016  3:48 PM 03/24/2016  4:45 PM Full Code [id number]  Loletha Grayer, MD ED   09/19/2015  7:27 PM 09/21/2015  5:47 PM Full Code [id number]  Lytle Butte, MD ED   07/28/2015  6:39 PM 07/31/2015  4:57 PM Full Code [id number]  [name] Costa, MD ED   07/10/2015 11:37 PM 07/12/2015 11:15 PM Full Code [id number]  Rise Patience, MD Inpatient   04/12/2015  7:05 PM 04/17/2015  7:10 PM Full Code [id number]  Geradine Girt, DO Inpatient   06/29/2014 12:24 PM 07/09/2014  2:15 PM Full Code [id number]  Bary Leriche, PA-C Inpatient      TOTAL TIME TAKING CARE OF THIS PATIENT: 40 minutes.  Henreitta Leber M.D on 07/19/2016 at 4:21 PM  Between 7am to 6pm - Pager - [phone]  After 6pm go to [email or web address] - Proofreader  [facility]  Office  [phone]  CC: Primary care physician; [name], MD

## 2016-07-19 NOTE — Progress Notes (Signed)
Pt would like something for constipation, text page sent to hospitalist.will wait for orders. No further concerns. Conley Simmonds, RN, BSN

## 2016-07-19 NOTE — Telephone Encounter (Signed)
Patient scheduled for 12.19.18 @ 4:30.
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
## 2016-07-19 NOTE — Care Management Important Message (Signed)
Important Message  Patient Details  Name: YEELENG SMET MRN: [id number] Date of Birth: 1928/01/23   Medicare Important Message Given:  Yes Initial signed IM printed from Epic and given to patient.   Katrina Stack, RN 07/19/2016, 8:45 AM

## 2016-07-19 NOTE — Plan of Care (Signed)
Problem: Safety: Goal: Ability to remain free from injury will improve Outcome: Progressing Pt up to bathroom with walker and standby assist, tolerated well. Non skid socks in place & bed alarm. Will continue to monitor.  Problem: Pain Managment: Goal: General experience of comfort will improve Outcome: Completed/Met Date Met: 07/19/16 No complaints of pain this shift.

## 2016-07-19 NOTE — Telephone Encounter (Signed)
Can I use 07/24/16 at 11.30?

## 2016-07-19 NOTE — Progress Notes (Signed)
Inpatient Diabetes Program Recommendations  AACE/ADA: New Consensus Statement on Inpatient Glycemic Control (2015)  Target Ranges:  Prepandial:   less than 140 mg/dL      Peak postprandial:   less than 180 mg/dL (1-2 hours)      Critically ill patients:  140 - 180 mg/dL   Lab Results  Component Value Date   GLUCAP 410 (H) 07/19/2016   HGBA1C 6.6 (H) 05/02/2016    Review of Glycemic Control  Results for [name], [name] (MRN [id number]) as of 07/19/2016 09:20  Ref. Range 07/18/2016 11:10 07/18/2016 16:12 07/18/2016 21:11 07/19/2016 07:41 07/19/2016 09:15  Glucose-Capillary Latest Ref Range: 65 - 99 mg/dL 289 (H) 291 (H) 220 (H) 436 (H) 410 (H)    Diabetes history: DM2 Outpatient Diabetes medications: Lantus 35 units QHS, Novolog 5-7 units TID with meals  Current orders for Inpatient glycemic control: Novolog 0-9 units TID with meals, Lantus 20 units qday  Inpatient Diabetes Program Recommendations: Noted Lantus was re-ordered and will begin today.    Please consider ordering Novolog 0-5 units QHS for bedtime correction.  Kenneth Fitz, [name], [name], [name], [name] Diabetes Coordinator Inpatient Diabetes Program  [phone] (Team Pager) [phone] ([place]) 07/19/2016 9:22 AM

## 2016-07-19 NOTE — Progress Notes (Signed)
Patient [name] Date of Encounter: 07/19/2016  Primary Cardiologist: [facility] Problem List     Principal Problem:   Acute respiratory distress Active Problems:   Anemia in chronic kidney disease   Acute on chronic diastolic heart failure (HCC)   History of GI bleed   Nonrheumatic aortic valve stenosis   Coronary artery disease involving native coronary artery of native heart without angina pectoris   Chronic atrial fibrillation (HCC)   Cerebral infarction (HCC)   CKD (chronic kidney disease) stage 3, GFR 30-59 ml/min   Swelling of lower extremity   Peripheral edema   Difficulty in walking, not elsewhere classified   Pressure injury of skin     Subjective   Breathing is much better. LE swelling improved. Labs stable. Reports significant constipation, now s/p laxative. Minus 3.8 L for the admission. Weight down 6 pounds this admission to 240 pounds.   Inpatient Medications    Scheduled Meds: . aspirin EC  81 mg Oral Daily  . calcitRIOL  0.25 mcg Oral Once per day on Mon Wed Fri  . carvedilol  6.25 mg Oral BID  . cholecalciferol  1,000 Units Oral Daily  . escitalopram  10 mg Oral Daily  . ferrous sulfate  325 mg Oral Q breakfast  . furosemide  40 mg Intravenous Daily  . hydroxychloroquine  200 mg Oral Daily  . insulin aspart  0-9 Units Subcutaneous TID WC  . insulin glargine  20 Units Subcutaneous Daily  . isosorbide mononitrate  30 mg Oral Daily  . loratadine  10 mg Oral Daily  . magnesium hydroxide  30 mL Oral BID  . multivitamin with minerals  1 tablet Oral Daily  . multivitamin-lutein  1 capsule Oral BID  . pantoprazole  40 mg Oral Daily  . sodium chloride flush  3 mL Intravenous Q12H  . tamsulosin  0.4 mg Oral QPC supper  . tiotropium  18 mcg Inhalation Daily  . vitamin C  500 mg Oral Daily   Continuous Infusions:  PRN Meds: sodium chloride, acetaminophen, ALPRAZolam, HYDROcodone-acetaminophen, ipratropium-albuterol, ondansetron  (ZOFRAN) IV, sodium chloride flush   Vital Signs    Vitals:   07/18/16 2035 07/19/16 0453 07/19/16 0745 07/19/16 0947  BP: (!) 158/58 (!) 150/60 (!) 169/75 (!) 125/41  Pulse: 66 66 66 (!) 122  Resp: 18 16 18    Temp: 97.4 F (36.3 C) 97.9 F (36.6 C) 98.3 F (36.8 C) 99.8 F (37.7 C)  TempSrc: Oral  Oral Oral  SpO2: 97% 92% 100% 90%  Weight:  240 lb 14.4 oz (109.3 kg)    Height:        Intake/Output Summary (Last 24 hours) at 07/19/16 1005 Last data filed at 07/19/16 0936  Gross per 24 hour  Intake              720 ml  Output             2150 ml  Net            -1430 ml   Filed Weights   07/17/16 0332 07/18/16 0459 07/19/16 0453  Weight: 246 lb 6.2 oz (111.8 kg) 248 lb 12.8 oz (112.9 kg) 240 lb 14.4 oz (109.3 kg)    Physical Exam    GEN: Well nourished, well developed, in no acute distress.  HEENT: Grossly normal.  Neck: Supple, no JVD, carotid bruits, or masses. Cardiac: Irregularly irregular, no murmurs, rubs, or gallops. No clubbing or cyanosis. Trace LE  edema.  Radials/DP/PT 2+ and equal bilaterally.  Respiratory:  Respirations regular and unlabored, clear to auscultation bilaterally. GI: Soft, nontender, nondistended, BS + x 4. MS: no deformity or atrophy. Skin: warm and dry, no rash. Neuro:  Strength and sensation are intact. Psych: AAOx3.  Normal affect.  Labs    CBC  Recent Labs  07/16/16 1541 07/16/16 1802 07/17/16 0344 07/18/16 0329  WBC 5.8 5.4 5.8  --   NEUTROABS 4.0  --  3.9  --   HGB 7.3 Repeated and verified X2.* 7.6* 9.0* 9.3*  HCT 22.8 Repeated and verified X2.* 23.8* 27.1*  --   MCV 85.2 84.8 83.4  --   PLT 226.0 218 206  --    Basic Metabolic Panel  Recent Labs  07/18/16 0329 07/19/16 0611  NA 134* 131*  K 4.2 4.4  CL 99* 95*  CO2 27 25  GLUCOSE 164* 460*  BUN 48* 54*  CREATININE 2.31* 2.28*  CALCIUM 8.9 8.5*   Liver Function Tests  Recent Labs  07/16/16 1541  AST 24  ALT 14  ALKPHOS 115  BILITOT 0.7  PROT 6.7    ALBUMIN 3.6   No results for input(s): LIPASE, AMYLASE in the last 72 hours. Cardiac Enzymes  Recent Labs  07/17/16 0006 07/17/16 0344 07/17/16 1134  TROPONINI 0.03* 0.03* <0.03   BNP Invalid input(s): POCBNP D-Dimer No results for input(s): DDIMER in the last 72 hours. Hemoglobin A1C No results for input(s): HGBA1C in the last 72 hours. Fasting Lipid Panel No results for input(s): CHOL, HDL, LDLCALC, TRIG, CHOLHDL, LDLDIRECT in the last 72 hours. Thyroid Function Tests  Recent Labs  07/17/16 0006  TSH 4.105    Telemetry    Afib, 70's bpm - Personally Reviewed  ECG    n/a - Personally Reviewed  Radiology    No results found.  Cardiac Studies   Echo 03/2016: Study Conclusions  - Left ventricle: The cavity size was normal. There was mild   concentric hypertrophy. Systolic function was normal. The   estimated ejection fraction was in the range of 55% to 60%. Wall   motion was normal; there were no regional wall motion   abnormalities. The study is not technically sufficient to allow   evaluation of LV diastolic function. - Aortic valve: A bioprosthesis was present. Transvalvular velocity   was within the normal range for prosthetic valve Peak velocity   (S): 241 cm/s. Mean gradient (S): 14 mm Hg. Peak gradient (S): 23   mm Hg. - Left atrium: The atrium was mild to moderately dilated. - Right ventricle: Systolic function was normal. - Pulmonary arteries: Systolic pressure could not be accurately   estimated. - Inferior vena cava: The vessel was dilated. The respirophasic   diameter changes were blunted (< 50%), consistent with elevated   central venous pressure.  Impressions:  - Rhythm is atrial fibrillation.  Patient Profile     83 y.o. male with history of CAD s/p CABG x 1, chronic a-fib s/p Watchman, recurrent GI bleeds, AS s/p bioprosthetic AVR, and bladder cancer, admitted with worsening shortness of breath and leg edema. He was seen by his  PCP and was a found to have acute on chronic anemia with a hemoglobin of 7.3. He received 2 units PRBCs in the ED as well as IV furosemide for SOB.  Assessment & Plan    1. Acute respiratory distress: -Likely multifactorial including severe anemia with HGB 7.3 requiring 2 units of pRBC, as well as acute  on chronic diastolic CHF  -On room air  2. Acute on chronic diastolic CHF: -Transition to PO torsemide 40 mg daily (was on lasix at home) -Continue Coreg  3. Anemia:  -History of GI blood loss, AV malformations, anemia from chronic renal disease, iron deficiency, -HGB stable, though still on the low side -He has such low reserve, high risk of readmission -Procrit restarted November 2017  4. CAD as above: -No symptoms concerning for angina -On ASA,  -No plans for inpatient ischemia evaluation  5. Chronic Afib: -Rate controlled -Status post Watchman device in 08/2015 -ASA 81 mg daily  6. CKD stage III: -Stable  7. Aortic stenosis s/p AVR: -Outpatient follow up   Signed, Christell Faith, PA-C [place] Pager: [phone] 07/19/2016, 10:05 AM

## 2016-07-20 ENCOUNTER — Other Ambulatory Visit: Payer: Self-pay | Admitting: Internal Medicine

## 2016-07-20 DIAGNOSIS — E1122 Type 2 diabetes mellitus with diabetic chronic kidney disease: Secondary | ICD-10-CM | POA: Diagnosis not present

## 2016-07-20 DIAGNOSIS — D638 Anemia in other chronic diseases classified elsewhere: Secondary | ICD-10-CM | POA: Diagnosis not present

## 2016-07-20 DIAGNOSIS — N184 Chronic kidney disease, stage 4 (severe): Secondary | ICD-10-CM | POA: Diagnosis not present

## 2016-07-20 DIAGNOSIS — I69354 Hemiplegia and hemiparesis following cerebral infarction affecting left non-dominant side: Secondary | ICD-10-CM | POA: Diagnosis not present

## 2016-07-20 DIAGNOSIS — I5032 Chronic diastolic (congestive) heart failure: Secondary | ICD-10-CM | POA: Diagnosis not present

## 2016-07-20 DIAGNOSIS — I130 Hypertensive heart and chronic kidney disease with heart failure and stage 1 through stage 4 chronic kidney disease, or unspecified chronic kidney disease: Secondary | ICD-10-CM | POA: Diagnosis not present

## 2016-07-20 MED ORDER — INSULIN ASPART 100 UNIT/ML ~~LOC~~ SOLN: 5.0000 [IU] | SUBCUTANEOUS | 4 refills | Status: DC

## 2016-07-22 ENCOUNTER — Telehealth: Payer: Self-pay

## 2016-07-22 DIAGNOSIS — I130 Hypertensive heart and chronic kidney disease with heart failure and stage 1 through stage 4 chronic kidney disease, or unspecified chronic kidney disease: Secondary | ICD-10-CM | POA: Diagnosis not present

## 2016-07-22 DIAGNOSIS — I69354 Hemiplegia and hemiparesis following cerebral infarction affecting left non-dominant side: Secondary | ICD-10-CM | POA: Diagnosis not present

## 2016-07-22 DIAGNOSIS — N184 Chronic kidney disease, stage 4 (severe): Secondary | ICD-10-CM | POA: Diagnosis not present

## 2016-07-22 DIAGNOSIS — E1122 Type 2 diabetes mellitus with diabetic chronic kidney disease: Secondary | ICD-10-CM | POA: Diagnosis not present

## 2016-07-22 DIAGNOSIS — I5032 Chronic diastolic (congestive) heart failure: Secondary | ICD-10-CM | POA: Diagnosis not present

## 2016-07-22 DIAGNOSIS — D638 Anemia in other chronic diseases classified elsewhere: Secondary | ICD-10-CM | POA: Diagnosis not present

## 2016-07-22 NOTE — Telephone Encounter (Signed)
Unable to reach patient.  No answer.  Left a message to call the office back.  Will follow as appropriate with transitional care management.

## 2016-07-23 ENCOUNTER — Ambulatory Visit: Payer: Medicare Other | Admitting: Family

## 2016-07-23 ENCOUNTER — Telehealth: Payer: Self-pay

## 2016-07-23 DIAGNOSIS — I69354 Hemiplegia and hemiparesis following cerebral infarction affecting left non-dominant side: Secondary | ICD-10-CM | POA: Diagnosis not present

## 2016-07-23 DIAGNOSIS — I130 Hypertensive heart and chronic kidney disease with heart failure and stage 1 through stage 4 chronic kidney disease, or unspecified chronic kidney disease: Secondary | ICD-10-CM | POA: Diagnosis not present

## 2016-07-23 DIAGNOSIS — I5032 Chronic diastolic (congestive) heart failure: Secondary | ICD-10-CM | POA: Diagnosis not present

## 2016-07-23 DIAGNOSIS — E1122 Type 2 diabetes mellitus with diabetic chronic kidney disease: Secondary | ICD-10-CM | POA: Diagnosis not present

## 2016-07-23 DIAGNOSIS — N184 Chronic kidney disease, stage 4 (severe): Secondary | ICD-10-CM | POA: Diagnosis not present

## 2016-07-23 DIAGNOSIS — D638 Anemia in other chronic diseases classified elsewhere: Secondary | ICD-10-CM | POA: Diagnosis not present

## 2016-07-23 NOTE — Telephone Encounter (Signed)
TCM complete.

## 2016-07-23 NOTE — Telephone Encounter (Signed)
Transition Care Management Follow-up Telephone Call   Date discharged? 07/19/16   How have you been since you were released from the hospital? [place].  VERY WEAK. NO SOB. NO PAIN. REGULAR BM.  EATING/DRINKING WITHOUT ISSUES.   Do you understand why you were in the hospital? YES, I WAS SHORT OF BREATH, FLUID OVERLOAD AND ANEMIA.   Do you understand the discharge instructions? YES, INCREASE ACTIVITY AS TOLERATED.  PT twice a week.   Where were you discharged to? HOME.   Items Reviewed:  Medications reviewed:  YES, STOP TAKING LASIX 80MG .  TAKE ALL OTHER MEDICATIONS AS SCHEDULED.  Allergies reviewed: Asa, Losartan, Metoprolol, Penicillins.  Dietary changes reviewed: YES, Cardiac-diabetic diet.  Referrals reviewed: YES, home with HH, PT, OT, RN.   Functional Questionnaire:   Activities of Daily Living (ADLs):   He states they are independent in the following: Dressing, meal prep, self feeding, toileting. States they require assistance with the following: Ambulating with walker, bathing.   Any transportation issues/concerns?: NO issues at this time.   Any patient concerns? Continues taking Lasix 80mg  once discharged home.  Instructed to hold Lasix 80mg  per discharge summary. Deferred to PCP for follow up.   Confirmed importance and date/time of follow-up visits scheduled YES, appointment scheduled 07/29/16 at 4:30.  Provider Appointment booked with Dr. Derrel Nip (PCP).  Confirmed with patient if condition begins to worsen call PCP or go to the ER.  Patient was given the office number and encouraged to call back with question or concerns.  : YES, verbalized understanding.

## 2016-07-24 NOTE — Telephone Encounter (Signed)
Patient was started on Torsemide 20 mg tablet take 2 tablets daily for total of 40 mg. Patient lasix 80 mg D/C called patient and confirmed not taking Lasix just torsemide. FYI

## 2016-07-25 ENCOUNTER — Ambulatory Visit: Payer: Medicare Other | Attending: Family | Admitting: Family

## 2016-07-25 ENCOUNTER — Encounter: Payer: Self-pay | Admitting: Family

## 2016-07-25 VITALS — BP 101/76 | HR 66 | Resp 20 | Ht 70.0 in | Wt 244.0 lb

## 2016-07-25 DIAGNOSIS — D649 Anemia, unspecified: Secondary | ICD-10-CM | POA: Diagnosis not present

## 2016-07-25 DIAGNOSIS — I110 Hypertensive heart disease with heart failure: Secondary | ICD-10-CM | POA: Diagnosis not present

## 2016-07-25 DIAGNOSIS — Z7982 Long term (current) use of aspirin: Secondary | ICD-10-CM | POA: Diagnosis not present

## 2016-07-25 DIAGNOSIS — Z87891 Personal history of nicotine dependence: Secondary | ICD-10-CM | POA: Insufficient documentation

## 2016-07-25 DIAGNOSIS — I2510 Atherosclerotic heart disease of native coronary artery without angina pectoris: Secondary | ICD-10-CM | POA: Insufficient documentation

## 2016-07-25 DIAGNOSIS — N400 Enlarged prostate without lower urinary tract symptoms: Secondary | ICD-10-CM | POA: Insufficient documentation

## 2016-07-25 DIAGNOSIS — I482 Chronic atrial fibrillation, unspecified: Secondary | ICD-10-CM

## 2016-07-25 DIAGNOSIS — Z79899 Other long term (current) drug therapy: Secondary | ICD-10-CM | POA: Insufficient documentation

## 2016-07-25 DIAGNOSIS — D638 Anemia in other chronic diseases classified elsewhere: Secondary | ICD-10-CM | POA: Diagnosis not present

## 2016-07-25 DIAGNOSIS — E1122 Type 2 diabetes mellitus with diabetic chronic kidney disease: Secondary | ICD-10-CM | POA: Diagnosis not present

## 2016-07-25 DIAGNOSIS — Z794 Long term (current) use of insulin: Secondary | ICD-10-CM | POA: Diagnosis not present

## 2016-07-25 DIAGNOSIS — I5032 Chronic diastolic (congestive) heart failure: Secondary | ICD-10-CM | POA: Insufficient documentation

## 2016-07-25 DIAGNOSIS — N184 Chronic kidney disease, stage 4 (severe): Secondary | ICD-10-CM | POA: Diagnosis not present

## 2016-07-25 DIAGNOSIS — E785 Hyperlipidemia, unspecified: Secondary | ICD-10-CM | POA: Insufficient documentation

## 2016-07-25 DIAGNOSIS — Z951 Presence of aortocoronary bypass graft: Secondary | ICD-10-CM | POA: Diagnosis not present

## 2016-07-25 DIAGNOSIS — I69354 Hemiplegia and hemiparesis following cerebral infarction affecting left non-dominant side: Secondary | ICD-10-CM | POA: Diagnosis not present

## 2016-07-25 DIAGNOSIS — I130 Hypertensive heart and chronic kidney disease with heart failure and stage 1 through stage 4 chronic kidney disease, or unspecified chronic kidney disease: Secondary | ICD-10-CM | POA: Diagnosis not present

## 2016-07-25 DIAGNOSIS — I10 Essential (primary) hypertension: Secondary | ICD-10-CM

## 2016-07-25 NOTE — Patient Instructions (Signed)
Continue weighing daily and call for an overnight weight gain of > 2 pounds or a weekly weight gain of >5 pounds. 
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
## 2016-07-26 DIAGNOSIS — I10 Essential (primary) hypertension: Secondary | ICD-10-CM | POA: Insufficient documentation

## 2016-07-26 DIAGNOSIS — N184 Chronic kidney disease, stage 4 (severe): Secondary | ICD-10-CM | POA: Diagnosis not present

## 2016-07-26 DIAGNOSIS — I130 Hypertensive heart and chronic kidney disease with heart failure and stage 1 through stage 4 chronic kidney disease, or unspecified chronic kidney disease: Secondary | ICD-10-CM | POA: Diagnosis not present

## 2016-07-26 DIAGNOSIS — E1122 Type 2 diabetes mellitus with diabetic chronic kidney disease: Secondary | ICD-10-CM | POA: Diagnosis not present

## 2016-07-26 DIAGNOSIS — I69354 Hemiplegia and hemiparesis following cerebral infarction affecting left non-dominant side: Secondary | ICD-10-CM | POA: Diagnosis not present

## 2016-07-26 DIAGNOSIS — D638 Anemia in other chronic diseases classified elsewhere: Secondary | ICD-10-CM | POA: Diagnosis not present

## 2016-07-26 DIAGNOSIS — I5032 Chronic diastolic (congestive) heart failure: Secondary | ICD-10-CM | POA: Diagnosis not present

## 2016-07-29 ENCOUNTER — Ambulatory Visit (INDEPENDENT_AMBULATORY_CARE_PROVIDER_SITE_OTHER): Payer: Medicare Other | Admitting: Internal Medicine

## 2016-07-29 ENCOUNTER — Encounter: Payer: Self-pay | Admitting: Internal Medicine

## 2016-07-29 DIAGNOSIS — F5105 Insomnia due to other mental disorder: Secondary | ICD-10-CM | POA: Diagnosis not present

## 2016-07-29 DIAGNOSIS — R609 Edema, unspecified: Secondary | ICD-10-CM | POA: Diagnosis not present

## 2016-07-29 DIAGNOSIS — N184 Chronic kidney disease, stage 4 (severe): Secondary | ICD-10-CM | POA: Diagnosis not present

## 2016-07-29 DIAGNOSIS — I5032 Chronic diastolic (congestive) heart failure: Secondary | ICD-10-CM | POA: Diagnosis not present

## 2016-07-29 DIAGNOSIS — F419 Anxiety disorder, unspecified: Secondary | ICD-10-CM

## 2016-07-29 DIAGNOSIS — D638 Anemia in other chronic diseases classified elsewhere: Secondary | ICD-10-CM | POA: Diagnosis not present

## 2016-07-29 DIAGNOSIS — E1122 Type 2 diabetes mellitus with diabetic chronic kidney disease: Secondary | ICD-10-CM | POA: Diagnosis not present

## 2016-07-29 DIAGNOSIS — I69354 Hemiplegia and hemiparesis following cerebral infarction affecting left non-dominant side: Secondary | ICD-10-CM | POA: Diagnosis not present

## 2016-07-29 DIAGNOSIS — I130 Hypertensive heart and chronic kidney disease with heart failure and stage 1 through stage 4 chronic kidney disease, or unspecified chronic kidney disease: Secondary | ICD-10-CM | POA: Diagnosis not present

## 2016-07-29 MED ORDER — ALPRAZOLAM 0.25 MG PO TABS: 0.2500 mg | ORAL_TABLET | Freq: Every evening | ORAL | 0 refills | Status: DC | PRN

## 2016-07-29 NOTE — Patient Instructions (Signed)
I'm glad you are feeling better  I have prescribed a very low dose of alprazolam for your insomnia   You should be taking both torsemide capsules daily in the morning  Your swelling is due to your atrial fibrillation   Wear compression nee highs  as often as you can tolerate and elevate your legs

## 2016-07-29 NOTE — Progress Notes (Signed)
Pre visit review using our clinic review tool, if applicable. No additional management support is needed unless otherwise documented below in the visit note. 

## 2016-07-29 NOTE — Progress Notes (Signed)
Subjective:  Patient ID: [name], male    DOB: [birth year]  Age: 83 y.o. MRN: [id number]  CC: Diagnoses of Peripheral edema, Insomnia secondary to anxiety, and Anemia of chronic disease were pertinent to this visit.  HPI [name] presents for hospital follow up.  He was admitted on [facility] on tues  Feb 6th, after presenting to clinic for follow up and found to be in respiratory distress with increased fluid overload and dyspnea .  He was treated for diastoilc heart failure secondary to severe aortic stenosis complicated by acute blood loss with IV lasix , and for symptomatic anemia with transfusion of 2 units of PRBCs for hgb 7.1 and given a pro crit injection . hgb was 9.6  On day of discharge ,  January 9th .  Has an appt at the cancer center on Friday   Feels good today,  Had PT this morning and was not short of breath.  Weight has been stable with home daily surveillance, although he is up to lbs today compared to last visit.   He has chronic lower extremity edema,  And has had his dose of torsemide changed to 40 mg daily b ut he has been dividing the dose into morning and evening.    Wants to know why he has leg edema  Regarding his diabetes management: His Lantus was held during hospitalization and BS were reportedly labile.   He had an episode of extremely high readings on the second day home, Feb 10 two weeks ago with a home reading of 600.  Additional doses or regular insulin were given until  Blood sugar was < 300.   This mornings's fasting 98 ,  215  After lunch  Using novolog sliding scale   And 35 units of lantus   Using an OTC  sleep aid for insomnia,  Not working  Generic benadryl active ingredient.  Has been very tired due to nightly insomnia,  No prior trial of alprazolam.   Outpatient Medications Prior to Visit  Medication Sig Dispense Refill  . acetaminophen (TYLENOL) 325 MG tablet Take 2 tablets (650 mg total) by mouth every 6 (six) hours as needed for mild pain (or  Fever >/= 101).    [name] aspirin EC 81 MG tablet Take 1 tablet (81 mg total) by mouth daily. 90 tablet 3  . azelastine (OPTIVAR) 0.05 % ophthalmic solution Place 1 drop into the right eye 2 (two) times daily.    . calcitRIOL (ROCALTROL) 0.25 MCG capsule TAKE ONE CAPSULE THREE TIMES A WEEK (MONDAY, WEDNESDAY, AND FRIDAY) 15 capsule 5  . carvedilol (COREG) 6.25 MG tablet Take 1 tablet (6.25 mg total) by mouth 2 (two) times daily. 180 tablet 0  . cholecalciferol (VITAMIN D) 1000 units tablet Take 1,000 Units by mouth daily.    [name] escitalopram (LEXAPRO) 10 MG tablet TAKE 1 TABLET EVERY DAY WITH DINNER 30 tablet 3  . ferrous sulfate 325 (65 FE) MG tablet Take 325 mg by mouth daily with breakfast.    . HYDROcodone-acetaminophen (NORCO/VICODIN) 5-325 MG tablet Take 1 tablet by mouth daily as needed for moderate pain. DO NOT EXCEED 4GM OF APAP IN 24 HOURS FROM ALL SOURCES 30 tablet 0  . hydroxychloroquine (PLAQUENIL) 200 MG tablet Take 200 mg by mouth daily.    . insulin aspart (NOVOLOG) 100 UNIT/ML injection Inject 5-7 Units into the skin See admin instructions. Inject  3 units sq 3 times daily with meals 10 mL 4  .  insulin glargine (LANTUS) 100 UNIT/ML injection Inject 0.35 mLs (35 Units total) into the skin at bedtime. 10 mL 11  . isosorbide mononitrate (IMDUR) 30 MG 24 hr tablet Take 30 mg by mouth daily.    [name] loratadine (CLARITIN) 10 MG tablet Take 10 mg by mouth daily.    . Multiple Vitamin (MULTIVITAMIN WITH MINERALS) TABS tablet Take 1 tablet by mouth daily.    . Multiple Vitamins-Minerals (PRESERVISION AREDS 2) CAPS Take 1 capsule by mouth 2 (two) times daily.    . pantoprazole (PROTONIX) 40 MG tablet Take 1 tablet (40 mg total) by mouth daily. 90 tablet 3  . tamsulosin (FLOMAX) 0.4 MG CAPS capsule TAKE 1 CAPSULE BY MOUTH DAILY AFTER SUPPER 30 capsule 5  . tiotropium (SPIRIVA) 18 MCG inhalation capsule Place 1 capsule (18 mcg total) into inhaler and inhale daily. 30 capsule 12  . torsemide (DEMADEX)  20 MG tablet Take 2 tablets (40 mg total) by mouth daily. 60 tablet 1  . vitamin C (ASCORBIC ACID) 500 MG tablet Take 500 mg by mouth daily.     No facility-administered medications prior to visit.     Review of Systems;  Patient denies headache, fevers, malaise, unintentional weight loss, skin rash, eye pain, sinus congestion and sinus pain, sore throat, dysphagia,  hemoptysis , cough, dyspnea, wheezing, chest pain, palpitations, orthopnea, edema, abdominal pain, nausea, melena, diarrhea, constipation, flank pain, dysuria, hematuria, urinary  Frequency, nocturia, numbness, tingling, seizures,  Focal weakness, Loss of consciousness,  Tremor, insomnia, depression, anxiety, and suicidal ideation.      Objective:  BP (!) 142/52   Pulse 62   Resp 16   Wt 248 lb (112.5 kg)   SpO2 97%   BMI 35.58 kg/m   BP Readings from Last 3 Encounters:  07/29/16 (!) 142/52  07/25/16 101/76  07/19/16 (!) 151/64    Wt Readings from Last 3 Encounters:  07/29/16 248 lb (112.5 kg)  07/25/16 244 lb (110.7 kg)  07/19/16 240 lb 14.4 oz (109.3 kg)    General appearance: alert, cooperative and appears stated age Ears: normal TM's and external ear canals both ears Throat: lips, mucosa, and tongue normal; teeth and gums normal Neck: no adenopathy, no carotid bruit, supple, symmetrical, trachea midline and thyroid not enlarged, symmetric, no tenderness/mass/nodules Back: symmetric, no curvature. ROM normal. No CVA tenderness. Lungs: clear to auscultation bilaterally Heart: regular rate and rhythm, S1, S2 normal, no murmur, click, rub or gallop Abdomen: soft, non-tender; bowel sounds normal; no masses,  no organomegaly Pulses: 2+ and symmetric Skin: Skin color, texture, turgor normal. No rashes or lesions Lymph nodes: Cervical, supraclavicular, and axillary nodes normal.  Lab Results  Component Value Date   HGBA1C 6.6 (H) 05/02/2016   HGBA1C 6.8 (H) 09/20/2015   HGBA1C 6.9 (H) 06/22/2015    Lab  Results  Component Value Date   CREATININE 2.28 (H) 07/19/2016   CREATININE 2.31 (H) 07/18/2016   CREATININE 2.21 (H) 07/17/2016    Lab Results  Component Value Date   WBC 5.8 07/17/2016   HGB 9.3 (L) 07/18/2016   HCT 27.1 (L) 07/17/2016   PLT 206 07/17/2016   GLUCOSE 460 (H) 07/19/2016   CHOL 100 05/14/2016   TRIG 25 05/14/2016   HDL 48 05/14/2016   LDLDIRECT 56.0 06/22/2015   LDLCALC 47 05/14/2016   ALT 14 07/16/2016   AST 24 07/16/2016   NA 131 (L) 07/19/2016   K 4.4 07/19/2016   CL 95 (L) 07/19/2016   CREATININE 2.28 (  H) 07/19/2016   BUN 54 (H) 07/19/2016   CO2 25 07/19/2016   TSH 4.105 07/17/2016   PSA 0.48 09/07/2014   INR 1.03 06/01/2016   HGBA1C 6.6 (H) 05/02/2016   MICROALBUR 70.5 (H) 06/21/2015    Dg Chest 2 View  Result Date: 07/16/2016 CLINICAL DATA:  Shortness of breath, slight chest pain with deep inspiration, swelling in feet, history hypertension, diabetes mellitus, coronary artery disease, CHF, atrial fibrillation EXAM: CHEST  2 VIEW COMPARISON:  06/03/2016 FINDINGS: Enlargement of cardiac silhouette post median sternotomy and AVR. Atherosclerotic calcification aorta. Slight pulmonary vascular congestion. Interstitial prominence in the mid to lower lungs suggesting mild failure. RIGHT pleural effusion, small. No segmental consolidation or pneumothorax. Bones demineralized. IMPRESSION: Enlargement of cardiac silhouette with pulmonary vascular congestion, small RIGHT pleural effusion, and suspect mild pulmonary edema. Post AVR. Aortic atherosclerosis. Electronically Signed   By: Lavonia Dana M.D.   On: 07/16/2016 18:29    Assessment & Plan:   Problem List Items Addressed This Visit    Anemia of chronic disease    He was transfused 2 units and given procrit during recent hospitalization.  At discharge on feb 9 his level was 9. 6  For repaat this Friday at the cancer center.       Relevant Orders   CBC with Differential/Platelet   Basic metabolic panel    Insomnia secondary to anxiety    30 day trial of alprazolam 0.25 mg The risks and benefits of benzodiazepine use were discussed with patient today including excessive sedation leading to respiratory depression,  impaired thinking/driving, and addiction.  Patient was advised to avoid concurrent use with alcohol, to use medication only as needed and not to share with others  .       Relevant Medications   ALPRAZolam (XANAX) 0.25 MG tablet   Peripheral edema    Has had recent vascular evaluations  That were negative for  DVT and venous insufficiency.  Diastolic dysfunction appears to be the cause, advised to take both torsemide doses in the morning instead of splitting them.          I am having Mr. Concepcion start on ALPRAZolam. I am also having him maintain his ferrous sulfate, hydroxychloroquine, vitamin C, multivitamin with minerals, PRESERVISION AREDS 2, cholecalciferol, acetaminophen, tamsulosin, calcitRIOL, escitalopram, aspirin EC, carvedilol, isosorbide mononitrate, pantoprazole, tiotropium, azelastine, loratadine, insulin glargine, HYDROcodone-acetaminophen, torsemide, and insulin aspart.  Meds ordered this encounter  Medications  . ALPRAZolam (XANAX) 0.25 MG tablet    Sig: Take 1 tablet (0.25 mg total) by mouth at bedtime as needed for anxiety.    Dispense:  30 tablet    Refill:  0   A total of 25 minutes of face to face time was spent with patient more than half of which was spent in counselling about the above mentioned conditions  and coordination of care   There are no discontinued medications.  Follow-up: No Follow-up on file.   [name], MD

## 2016-07-30 ENCOUNTER — Telehealth: Payer: Self-pay | Admitting: *Deleted

## 2016-07-30 DIAGNOSIS — D638 Anemia in other chronic diseases classified elsewhere: Secondary | ICD-10-CM | POA: Diagnosis not present

## 2016-07-30 DIAGNOSIS — I69354 Hemiplegia and hemiparesis following cerebral infarction affecting left non-dominant side: Secondary | ICD-10-CM | POA: Diagnosis not present

## 2016-07-30 DIAGNOSIS — I5032 Chronic diastolic (congestive) heart failure: Secondary | ICD-10-CM | POA: Diagnosis not present

## 2016-07-30 DIAGNOSIS — E1122 Type 2 diabetes mellitus with diabetic chronic kidney disease: Secondary | ICD-10-CM | POA: Diagnosis not present

## 2016-07-30 DIAGNOSIS — N184 Chronic kidney disease, stage 4 (severe): Secondary | ICD-10-CM | POA: Diagnosis not present

## 2016-07-30 DIAGNOSIS — I130 Hypertensive heart and chronic kidney disease with heart failure and stage 1 through stage 4 chronic kidney disease, or unspecified chronic kidney disease: Secondary | ICD-10-CM | POA: Diagnosis not present

## 2016-07-30 NOTE — Assessment & Plan Note (Signed)
Has had recent vascular evaluations  That were negative for  DVT and venous insufficiency.  Diastolic dysfunction appears to be the cause, advised to take both torsemide doses in the morning instead of splitting them.

## 2016-07-30 NOTE — Assessment & Plan Note (Signed)
He was transfused 2 units and given procrit during recent hospitalization.  At discharge on feb 9 his level was 9. 6  For repaat this Friday at the cancer center.

## 2016-07-30 NOTE — Assessment & Plan Note (Signed)
30 day trial of alprazolam 0.25 mg The risks and benefits of benzodiazepine use were discussed with patient today including excessive sedation leading to respiratory depression,  impaired thinking/driving, and addiction.  Patient was advised to avoid concurrent use with alcohol, to use medication only as needed and not to share with others  .

## 2016-07-30 NOTE — Telephone Encounter (Signed)
[facility] reported a fall . Pt fell will getting his walker,he sustained a nose bleed. EMS was called, he did not go to the ER, pt has no other injuries.  Contact Esther from [facility]  [phone]

## 2016-07-31 DIAGNOSIS — E1140 Type 2 diabetes mellitus with diabetic neuropathy, unspecified: Secondary | ICD-10-CM | POA: Diagnosis not present

## 2016-07-31 DIAGNOSIS — Z794 Long term (current) use of insulin: Secondary | ICD-10-CM | POA: Diagnosis not present

## 2016-07-31 DIAGNOSIS — B351 Tinea unguium: Secondary | ICD-10-CM | POA: Diagnosis not present

## 2016-07-31 DIAGNOSIS — L97521 Non-pressure chronic ulcer of other part of left foot limited to breakdown of skin: Secondary | ICD-10-CM | POA: Diagnosis not present

## 2016-08-01 DIAGNOSIS — N184 Chronic kidney disease, stage 4 (severe): Secondary | ICD-10-CM | POA: Diagnosis not present

## 2016-08-01 DIAGNOSIS — I5032 Chronic diastolic (congestive) heart failure: Secondary | ICD-10-CM | POA: Diagnosis not present

## 2016-08-01 DIAGNOSIS — I69354 Hemiplegia and hemiparesis following cerebral infarction affecting left non-dominant side: Secondary | ICD-10-CM | POA: Diagnosis not present

## 2016-08-01 DIAGNOSIS — E1122 Type 2 diabetes mellitus with diabetic chronic kidney disease: Secondary | ICD-10-CM | POA: Diagnosis not present

## 2016-08-01 DIAGNOSIS — D638 Anemia in other chronic diseases classified elsewhere: Secondary | ICD-10-CM | POA: Diagnosis not present

## 2016-08-01 DIAGNOSIS — I130 Hypertensive heart and chronic kidney disease with heart failure and stage 1 through stage 4 chronic kidney disease, or unspecified chronic kidney disease: Secondary | ICD-10-CM | POA: Diagnosis not present

## 2016-08-02 ENCOUNTER — Encounter: Payer: Self-pay | Admitting: Hematology and Oncology

## 2016-08-02 ENCOUNTER — Inpatient Hospital Stay: Payer: Medicare Other | Attending: Hematology and Oncology | Admitting: Hematology and Oncology

## 2016-08-02 ENCOUNTER — Inpatient Hospital Stay: Payer: Medicare Other

## 2016-08-02 VITALS — BP 153/70 | HR 69 | Temp 98.5°F | Resp 18 | Wt 254.3 lb

## 2016-08-02 DIAGNOSIS — D500 Iron deficiency anemia secondary to blood loss (chronic): Secondary | ICD-10-CM | POA: Diagnosis not present

## 2016-08-02 DIAGNOSIS — D631 Anemia in chronic kidney disease: Secondary | ICD-10-CM

## 2016-08-02 DIAGNOSIS — I2510 Atherosclerotic heart disease of native coronary artery without angina pectoris: Secondary | ICD-10-CM | POA: Insufficient documentation

## 2016-08-02 DIAGNOSIS — E785 Hyperlipidemia, unspecified: Secondary | ICD-10-CM | POA: Insufficient documentation

## 2016-08-02 DIAGNOSIS — M7989 Other specified soft tissue disorders: Secondary | ICD-10-CM

## 2016-08-02 DIAGNOSIS — Z87891 Personal history of nicotine dependence: Secondary | ICD-10-CM | POA: Diagnosis not present

## 2016-08-02 DIAGNOSIS — R001 Bradycardia, unspecified: Secondary | ICD-10-CM | POA: Insufficient documentation

## 2016-08-02 DIAGNOSIS — N183 Chronic kidney disease, stage 3 (moderate): Secondary | ICD-10-CM

## 2016-08-02 DIAGNOSIS — N400 Enlarged prostate without lower urinary tract symptoms: Secondary | ICD-10-CM | POA: Diagnosis not present

## 2016-08-02 DIAGNOSIS — Z8673 Personal history of transient ischemic attack (TIA), and cerebral infarction without residual deficits: Secondary | ICD-10-CM | POA: Diagnosis not present

## 2016-08-02 DIAGNOSIS — I5022 Chronic systolic (congestive) heart failure: Secondary | ICD-10-CM | POA: Insufficient documentation

## 2016-08-02 DIAGNOSIS — Z7901 Long term (current) use of anticoagulants: Secondary | ICD-10-CM | POA: Diagnosis not present

## 2016-08-02 DIAGNOSIS — Z8619 Personal history of other infectious and parasitic diseases: Secondary | ICD-10-CM

## 2016-08-02 DIAGNOSIS — Z9181 History of falling: Secondary | ICD-10-CM | POA: Diagnosis not present

## 2016-08-02 DIAGNOSIS — I482 Chronic atrial fibrillation: Secondary | ICD-10-CM | POA: Diagnosis not present

## 2016-08-02 DIAGNOSIS — Z794 Long term (current) use of insulin: Secondary | ICD-10-CM | POA: Insufficient documentation

## 2016-08-02 DIAGNOSIS — Z8551 Personal history of malignant neoplasm of bladder: Secondary | ICD-10-CM | POA: Insufficient documentation

## 2016-08-02 DIAGNOSIS — N189 Chronic kidney disease, unspecified: Secondary | ICD-10-CM | POA: Insufficient documentation

## 2016-08-02 DIAGNOSIS — Z8042 Family history of malignant neoplasm of prostate: Secondary | ICD-10-CM | POA: Diagnosis not present

## 2016-08-02 DIAGNOSIS — E119 Type 2 diabetes mellitus without complications: Secondary | ICD-10-CM

## 2016-08-02 DIAGNOSIS — I350 Nonrheumatic aortic (valve) stenosis: Secondary | ICD-10-CM | POA: Insufficient documentation

## 2016-08-02 DIAGNOSIS — I129 Hypertensive chronic kidney disease with stage 1 through stage 4 chronic kidney disease, or unspecified chronic kidney disease: Secondary | ICD-10-CM | POA: Diagnosis not present

## 2016-08-02 DIAGNOSIS — Z79899 Other long term (current) drug therapy: Secondary | ICD-10-CM | POA: Insufficient documentation

## 2016-08-02 DIAGNOSIS — Z8719 Personal history of other diseases of the digestive system: Secondary | ICD-10-CM | POA: Diagnosis not present

## 2016-08-02 DIAGNOSIS — Z7982 Long term (current) use of aspirin: Secondary | ICD-10-CM | POA: Diagnosis not present

## 2016-08-02 DIAGNOSIS — M1990 Unspecified osteoarthritis, unspecified site: Secondary | ICD-10-CM | POA: Diagnosis not present

## 2016-08-02 DIAGNOSIS — N184 Chronic kidney disease, stage 4 (severe): Principal | ICD-10-CM

## 2016-08-02 LAB — IRON AND TIBC
Iron: 25 ug/dL — ABNORMAL LOW (ref 45–182)
Saturation Ratios: 8 % — ABNORMAL LOW (ref 17.9–39.5)
TIBC: 307 ug/dL (ref 250–450)
UIBC: 282 ug/dL

## 2016-08-02 LAB — CBC WITH DIFFERENTIAL/PLATELET
Basophils Absolute: 0.1 10*3/uL (ref 0–0.1)
Basophils Relative: 1 %
Eosinophils Absolute: 0.3 10*3/uL (ref 0–0.7)
Eosinophils Relative: 5 %
HCT: 24.2 % — ABNORMAL LOW (ref 40.0–52.0)
Hemoglobin: 8.1 g/dL — ABNORMAL LOW (ref 13.0–18.0)
Lymphocytes Relative: 9 %
Lymphs Abs: 0.5 10*3/uL — ABNORMAL LOW (ref 1.0–3.6)
MCH: 27.9 pg (ref 26.0–34.0)
MCHC: 33.4 g/dL (ref 32.0–36.0)
MCV: 83.7 fL (ref 80.0–100.0)
Monocytes Absolute: 0.8 10*3/uL (ref 0.2–1.0)
Monocytes Relative: 12 %
Neutro Abs: 4.4 10*3/uL (ref 1.4–6.5)
Neutrophils Relative %: 73 %
Platelets: 221 10*3/uL (ref 150–440)
RBC: 2.89 MIL/uL — ABNORMAL LOW (ref 4.40–5.90)
RDW: 16.9 % — ABNORMAL HIGH (ref 11.5–14.5)
WBC: 6.1 10*3/uL (ref 3.8–10.6)

## 2016-08-02 LAB — SAMPLE TO BLOOD BANK

## 2016-08-02 LAB — PREPARE RBC (CROSSMATCH)

## 2016-08-02 LAB — FERRITIN: Ferritin: 89 ng/mL (ref 24–336)
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
## 2016-08-03 ENCOUNTER — Other Ambulatory Visit: Payer: Self-pay | Admitting: Hematology and Oncology

## 2016-08-03 DIAGNOSIS — N183 Chronic kidney disease, stage 3 unspecified: Secondary | ICD-10-CM

## 2016-08-03 DIAGNOSIS — D631 Anemia in chronic kidney disease: Secondary | ICD-10-CM

## 2016-08-05 ENCOUNTER — Telehealth: Payer: Self-pay | Admitting: Internal Medicine

## 2016-08-05 ENCOUNTER — Inpatient Hospital Stay: Payer: Medicare Other

## 2016-08-05 ENCOUNTER — Other Ambulatory Visit: Payer: Self-pay | Admitting: Hematology and Oncology

## 2016-08-05 ENCOUNTER — Other Ambulatory Visit: Payer: Self-pay | Admitting: *Deleted

## 2016-08-05 VITALS — BP 137/71 | HR 69 | Temp 96.0°F | Resp 18

## 2016-08-05 DIAGNOSIS — D631 Anemia in chronic kidney disease: Secondary | ICD-10-CM

## 2016-08-05 DIAGNOSIS — N183 Chronic kidney disease, stage 3 unspecified: Secondary | ICD-10-CM

## 2016-08-05 DIAGNOSIS — I350 Nonrheumatic aortic (valve) stenosis: Secondary | ICD-10-CM | POA: Diagnosis not present

## 2016-08-05 DIAGNOSIS — N189 Chronic kidney disease, unspecified: Secondary | ICD-10-CM | POA: Diagnosis not present

## 2016-08-05 DIAGNOSIS — N184 Chronic kidney disease, stage 4 (severe): Secondary | ICD-10-CM

## 2016-08-05 DIAGNOSIS — I129 Hypertensive chronic kidney disease with stage 1 through stage 4 chronic kidney disease, or unspecified chronic kidney disease: Secondary | ICD-10-CM | POA: Diagnosis not present

## 2016-08-05 DIAGNOSIS — M7989 Other specified soft tissue disorders: Secondary | ICD-10-CM | POA: Diagnosis not present

## 2016-08-05 DIAGNOSIS — Z79899 Other long term (current) drug therapy: Secondary | ICD-10-CM | POA: Diagnosis not present

## 2016-08-05 LAB — CBC
HCT: 22.4 % — ABNORMAL LOW (ref 40.0–52.0)
Hemoglobin: 7.5 g/dL — ABNORMAL LOW (ref 13.0–18.0)
MCH: 27.9 pg (ref 26.0–34.0)
MCHC: 33.6 g/dL (ref 32.0–36.0)
MCV: 82.8 fL (ref 80.0–100.0)
Platelets: 225 10*3/uL (ref 150–440)
RBC: 2.71 MIL/uL — ABNORMAL LOW (ref 4.40–5.90)
RDW: 17.1 % — ABNORMAL HIGH (ref 11.5–14.5)
WBC: 5.8 10*3/uL (ref 3.8–10.6)

## 2016-08-05 MED ORDER — EPOETIN ALFA 10000 UNIT/ML IJ SOLN
10000.0000 [IU] | Freq: Once | INTRAMUSCULAR | Status: AC
  Administered 2016-08-05: 10000 [IU] via SUBCUTANEOUS
  Filled 2016-08-05: qty 2

## 2016-08-05 MED ORDER — DIPHENHYDRAMINE HCL 25 MG PO CAPS
25.0000 mg | ORAL_CAPSULE | Freq: Once | ORAL | Status: AC
  Administered 2016-08-05: 25 mg via ORAL
  Filled 2016-08-05: qty 1

## 2016-08-05 MED ORDER — ACETAMINOPHEN 325 MG PO TABS
650.0000 mg | ORAL_TABLET | Freq: Once | ORAL | Status: AC
  Administered 2016-08-05: 650 mg via ORAL
  Filled 2016-08-05: qty 2

## 2016-08-05 MED ORDER — SODIUM CHLORIDE 0.9 % IV SOLN
250.0000 mL | Freq: Once | INTRAVENOUS | Status: AC
  Administered 2016-08-05: 250 mL via INTRAVENOUS
  Filled 2016-08-05: qty 250

## 2016-08-05 NOTE — Telephone Encounter (Signed)
LOV: 07/29/16 No existing

## 2016-08-05 NOTE — Telephone Encounter (Signed)
This cannot be dealt with in a phone note.  Patient needs to schedule an appointment .  Can the caregiver be asked to claify her observations, I.e, how many alcoholic drinks per day?

## 2016-08-05 NOTE — Telephone Encounter (Signed)
Amy from [facility] and stated that she went to Thursday and caregiver stated that she has noticed that the pt is drinking more then usual, he is having few glasses of wine, few shots of bourbon and a couple of beers. They were wondering if Dr. Derrel Nip would increase his anti depressants. They are worried since he is a fall risk, and they have stated that his last fall was due part to this drinking. Please advise, thank you!  Call Amy @ [phone]

## 2016-08-05 NOTE — Telephone Encounter (Signed)
Left detailed mess informing Amy of below.

## 2016-08-06 DIAGNOSIS — E1122 Type 2 diabetes mellitus with diabetic chronic kidney disease: Secondary | ICD-10-CM | POA: Diagnosis not present

## 2016-08-06 DIAGNOSIS — I5032 Chronic diastolic (congestive) heart failure: Secondary | ICD-10-CM | POA: Diagnosis not present

## 2016-08-06 DIAGNOSIS — I130 Hypertensive heart and chronic kidney disease with heart failure and stage 1 through stage 4 chronic kidney disease, or unspecified chronic kidney disease: Secondary | ICD-10-CM | POA: Diagnosis not present

## 2016-08-06 DIAGNOSIS — N184 Chronic kidney disease, stage 4 (severe): Secondary | ICD-10-CM | POA: Diagnosis not present

## 2016-08-06 DIAGNOSIS — I69354 Hemiplegia and hemiparesis following cerebral infarction affecting left non-dominant side: Secondary | ICD-10-CM | POA: Diagnosis not present

## 2016-08-06 DIAGNOSIS — D638 Anemia in other chronic diseases classified elsewhere: Secondary | ICD-10-CM | POA: Diagnosis not present

## 2016-08-06 LAB — TYPE AND SCREEN
ABO/RH(D): O POS
Antibody Screen: NEGATIVE
Unit division: 0

## 2016-08-08 ENCOUNTER — Telehealth: Payer: Self-pay | Admitting: Cardiovascular Disease

## 2016-08-08 DIAGNOSIS — D638 Anemia in other chronic diseases classified elsewhere: Secondary | ICD-10-CM | POA: Diagnosis not present

## 2016-08-08 DIAGNOSIS — E1122 Type 2 diabetes mellitus with diabetic chronic kidney disease: Secondary | ICD-10-CM | POA: Diagnosis not present

## 2016-08-08 DIAGNOSIS — N184 Chronic kidney disease, stage 4 (severe): Secondary | ICD-10-CM | POA: Diagnosis not present

## 2016-08-08 DIAGNOSIS — I5032 Chronic diastolic (congestive) heart failure: Secondary | ICD-10-CM | POA: Diagnosis not present

## 2016-08-08 DIAGNOSIS — I130 Hypertensive heart and chronic kidney disease with heart failure and stage 1 through stage 4 chronic kidney disease, or unspecified chronic kidney disease: Secondary | ICD-10-CM | POA: Diagnosis not present

## 2016-08-08 DIAGNOSIS — I69354 Hemiplegia and hemiparesis following cerebral infarction affecting left non-dominant side: Secondary | ICD-10-CM | POA: Diagnosis not present

## 2016-08-08 NOTE — Telephone Encounter (Signed)
Roseanne Kaufman care taker calling to let us know while patient was in rehab at [facility]  Pt is home now But they changed his medications around Isosorbide 30 mg  We have him on 60 mg Would like to know if he needs to go back on the 60 mg

## 2016-08-08 NOTE — Telephone Encounter (Signed)
Spoke w/ Manuela Schwartz.  Advised her that pt's meds were adjusted at rehab for a reason and that unless his BP is elevated, not to make any changes. She reports that pt and his BP have been good since he has been home.  She will continue to monitor and call back w/ any questions or concerns.

## 2016-08-09 DIAGNOSIS — I5032 Chronic diastolic (congestive) heart failure: Secondary | ICD-10-CM | POA: Diagnosis not present

## 2016-08-09 DIAGNOSIS — D638 Anemia in other chronic diseases classified elsewhere: Secondary | ICD-10-CM | POA: Diagnosis not present

## 2016-08-09 DIAGNOSIS — N184 Chronic kidney disease, stage 4 (severe): Secondary | ICD-10-CM | POA: Diagnosis not present

## 2016-08-09 DIAGNOSIS — E1122 Type 2 diabetes mellitus with diabetic chronic kidney disease: Secondary | ICD-10-CM | POA: Diagnosis not present

## 2016-08-09 DIAGNOSIS — I69354 Hemiplegia and hemiparesis following cerebral infarction affecting left non-dominant side: Secondary | ICD-10-CM | POA: Diagnosis not present

## 2016-08-09 DIAGNOSIS — I130 Hypertensive heart and chronic kidney disease with heart failure and stage 1 through stage 4 chronic kidney disease, or unspecified chronic kidney disease: Secondary | ICD-10-CM | POA: Diagnosis not present

## 2016-08-12 ENCOUNTER — Telehealth: Payer: Self-pay | Admitting: Cardiovascular Disease

## 2016-08-12 NOTE — Telephone Encounter (Signed)
[name] with [facility] calling stating care giver called her  Stating pt has increased swelling in feet and legs along with SOB  They do not weight him every day She is calling to see if we need to increase his lasik  40 mg once a day Please advise

## 2016-08-12 NOTE — Telephone Encounter (Signed)
Called Amy Pope with [facility] regarding patient's symptoms. She says that she saw him today and his ankles/legs were more swollen than usual. She says that he was also more short of breath than normal. He hasn't been weighing consistently and she's talked to them about weighing daily so as to know how quickly he's gaining weight. He's currently taking torsemide 40mg  once daily.  Advised Amy to increase his torsemide to 80mg  daily for the next 4 days. She will draw a BMP on 08/15/16 and call me after those results so that we can decide to continue the 80mg  dose or go back to the 40mg  dose. Last potassium level was 4.4 on 07/19/16.

## 2016-08-13 ENCOUNTER — Telehealth: Payer: Self-pay | Admitting: Internal Medicine

## 2016-08-13 DIAGNOSIS — N183 Chronic kidney disease, stage 3 unspecified: Secondary | ICD-10-CM

## 2016-08-13 DIAGNOSIS — D631 Anemia in chronic kidney disease: Secondary | ICD-10-CM

## 2016-08-13 NOTE — Telephone Encounter (Signed)
Disregard Erline Levine.

## 2016-08-13 NOTE — Telephone Encounter (Signed)
NEEDS HGB CHECKED TOMORROW .  CHECK SUGARS EVERY 45 MINUTES TO MAKE SURE IT IS COMING DOWN   VERIGY ENTIRE INSULIN REGIEMN

## 2016-08-13 NOTE — Telephone Encounter (Signed)
That was a very  arge dose of novolog, and his blood sugar is going to drop even more,  So he should check it hourly for 3 more hours to make sure it is not dropping  Please tell him to watch his diet, and if he is drinking alcohol daily he needs to reduce his alcohol to one glass of wine,  Or one beer,  Or one shot  Of liquor daily MAX.[name]

## 2016-08-13 NOTE — Telephone Encounter (Addendum)
Spoke with patient he is on Novolog Sliding scale , took 40 units today due to elevated blood sugar.  Lantus 40 units.   I will call back to get insulin reading. Patient is scheduled for lab appointment 11:00am patient aware   Blood sugar 255 @ 1655 please advise.

## 2016-08-13 NOTE — Telephone Encounter (Signed)
Correct number is [id number]. Thank you1

## 2016-08-13 NOTE — Telephone Encounter (Signed)
Patient advised of below and verbalized an understanding  

## 2016-08-13 NOTE — Telephone Encounter (Signed)
[name] pt's care giver called and stated that pt's blood sugar was so high that it did not come up on meter, was 600 and can only get it down to 494. Caregiver has given 40 units of insulin, and it has not gone down. Please advise, thank you!  Call pt @ [phone]

## 2016-08-13 NOTE — Telephone Encounter (Addendum)
Blood sugar is 400 after taking 40 units of  Novolog .   Patient was laughing speaking fine .  Patient states feeling tired.   Took double Lasix yesterday .   Edema in lower legs, a little SOB, some with exertion.  No physical therapy due to weakness.    Blood transfusion  07/2716 2/261/8  Cbc hgb 7.5  Please advise

## 2016-08-14 ENCOUNTER — Telehealth: Payer: Self-pay | Admitting: *Deleted

## 2016-08-14 ENCOUNTER — Telehealth: Payer: Self-pay | Admitting: Internal Medicine

## 2016-08-14 ENCOUNTER — Emergency Department: Payer: Medicare Other

## 2016-08-14 ENCOUNTER — Encounter: Payer: Self-pay | Admitting: Emergency Medicine

## 2016-08-14 ENCOUNTER — Other Ambulatory Visit: Payer: Self-pay

## 2016-08-14 ENCOUNTER — Inpatient Hospital Stay
Admission: EM | Admit: 2016-08-14 | Discharge: 2016-08-17 | DRG: 291 | Disposition: A | Discharge status: Home Health | Payer: Medicare Other | Attending: Internal Medicine | Admitting: Internal Medicine

## 2016-08-14 ENCOUNTER — Telehealth: Payer: Self-pay | Admitting: Radiology

## 2016-08-14 ENCOUNTER — Other Ambulatory Visit (INDEPENDENT_AMBULATORY_CARE_PROVIDER_SITE_OTHER): Payer: Medicare Other

## 2016-08-14 DIAGNOSIS — D649 Anemia, unspecified: Secondary | ICD-10-CM

## 2016-08-14 DIAGNOSIS — I350 Nonrheumatic aortic (valve) stenosis: Secondary | ICD-10-CM | POA: Diagnosis present

## 2016-08-14 DIAGNOSIS — Z823 Family history of stroke: Secondary | ICD-10-CM | POA: Diagnosis not present

## 2016-08-14 DIAGNOSIS — N183 Chronic kidney disease, stage 3 unspecified: Secondary | ICD-10-CM

## 2016-08-14 DIAGNOSIS — Z8042 Family history of malignant neoplasm of prostate: Secondary | ICD-10-CM | POA: Diagnosis not present

## 2016-08-14 DIAGNOSIS — D638 Anemia in other chronic diseases classified elsewhere: Secondary | ICD-10-CM | POA: Diagnosis present

## 2016-08-14 DIAGNOSIS — R0602 Shortness of breath: Secondary | ICD-10-CM | POA: Diagnosis not present

## 2016-08-14 DIAGNOSIS — I482 Chronic atrial fibrillation: Secondary | ICD-10-CM

## 2016-08-14 DIAGNOSIS — N184 Chronic kidney disease, stage 4 (severe): Secondary | ICD-10-CM

## 2016-08-14 DIAGNOSIS — R0609 Other forms of dyspnea: Principal | ICD-10-CM

## 2016-08-14 DIAGNOSIS — I509 Heart failure, unspecified: Secondary | ICD-10-CM

## 2016-08-14 DIAGNOSIS — E8770 Fluid overload, unspecified: Secondary | ICD-10-CM | POA: Diagnosis not present

## 2016-08-14 DIAGNOSIS — Z8673 Personal history of transient ischemic attack (TIA), and cerebral infarction without residual deficits: Secondary | ICD-10-CM

## 2016-08-14 DIAGNOSIS — Z952 Presence of prosthetic heart valve: Secondary | ICD-10-CM

## 2016-08-14 DIAGNOSIS — N400 Enlarged prostate without lower urinary tract symptoms: Secondary | ICD-10-CM | POA: Diagnosis present

## 2016-08-14 DIAGNOSIS — J449 Chronic obstructive pulmonary disease, unspecified: Secondary | ICD-10-CM | POA: Diagnosis present

## 2016-08-14 DIAGNOSIS — Z833 Family history of diabetes mellitus: Secondary | ICD-10-CM

## 2016-08-14 DIAGNOSIS — Z880 Allergy status to penicillin: Secondary | ICD-10-CM | POA: Diagnosis not present

## 2016-08-14 DIAGNOSIS — E785 Hyperlipidemia, unspecified: Secondary | ICD-10-CM | POA: Diagnosis present

## 2016-08-14 DIAGNOSIS — I4821 Permanent atrial fibrillation: Secondary | ICD-10-CM | POA: Insufficient documentation

## 2016-08-14 DIAGNOSIS — Z953 Presence of xenogenic heart valve: Secondary | ICD-10-CM | POA: Diagnosis not present

## 2016-08-14 DIAGNOSIS — R0600 Dyspnea, unspecified: Secondary | ICD-10-CM | POA: Diagnosis not present

## 2016-08-14 DIAGNOSIS — I130 Hypertensive heart and chronic kidney disease with heart failure and stage 1 through stage 4 chronic kidney disease, or unspecified chronic kidney disease: Secondary | ICD-10-CM | POA: Diagnosis not present

## 2016-08-14 DIAGNOSIS — Z7982 Long term (current) use of aspirin: Secondary | ICD-10-CM

## 2016-08-14 DIAGNOSIS — R0603 Acute respiratory distress: Secondary | ICD-10-CM | POA: Diagnosis present

## 2016-08-14 DIAGNOSIS — I2510 Atherosclerotic heart disease of native coronary artery without angina pectoris: Secondary | ICD-10-CM

## 2016-08-14 DIAGNOSIS — H3530 Unspecified macular degeneration: Secondary | ICD-10-CM | POA: Diagnosis present

## 2016-08-14 DIAGNOSIS — I10 Essential (primary) hypertension: Secondary | ICD-10-CM | POA: Diagnosis not present

## 2016-08-14 DIAGNOSIS — Z951 Presence of aortocoronary bypass graft: Secondary | ICD-10-CM | POA: Diagnosis not present

## 2016-08-14 DIAGNOSIS — Z888 Allergy status to other drugs, medicaments and biological substances status: Secondary | ICD-10-CM

## 2016-08-14 DIAGNOSIS — Z8551 Personal history of malignant neoplasm of bladder: Secondary | ICD-10-CM

## 2016-08-14 DIAGNOSIS — I5031 Acute diastolic (congestive) heart failure: Secondary | ICD-10-CM | POA: Diagnosis not present

## 2016-08-14 DIAGNOSIS — M7989 Other specified soft tissue disorders: Secondary | ICD-10-CM | POA: Diagnosis not present

## 2016-08-14 DIAGNOSIS — Z87891 Personal history of nicotine dependence: Secondary | ICD-10-CM

## 2016-08-14 DIAGNOSIS — I5033 Acute on chronic diastolic (congestive) heart failure: Secondary | ICD-10-CM

## 2016-08-14 DIAGNOSIS — D631 Anemia in chronic kidney disease: Secondary | ICD-10-CM | POA: Diagnosis not present

## 2016-08-14 DIAGNOSIS — E1122 Type 2 diabetes mellitus with diabetic chronic kidney disease: Secondary | ICD-10-CM | POA: Diagnosis present

## 2016-08-14 DIAGNOSIS — Z886 Allergy status to analgesic agent status: Secondary | ICD-10-CM

## 2016-08-14 DIAGNOSIS — I441 Atrioventricular block, second degree: Secondary | ICD-10-CM | POA: Diagnosis present

## 2016-08-14 DIAGNOSIS — I110 Hypertensive heart disease with heart failure: Secondary | ICD-10-CM | POA: Diagnosis not present

## 2016-08-14 DIAGNOSIS — D509 Iron deficiency anemia, unspecified: Secondary | ICD-10-CM | POA: Diagnosis not present

## 2016-08-14 DIAGNOSIS — R1030 Lower abdominal pain, unspecified: Secondary | ICD-10-CM | POA: Diagnosis not present

## 2016-08-14 DIAGNOSIS — Z79899 Other long term (current) drug therapy: Secondary | ICD-10-CM

## 2016-08-14 DIAGNOSIS — Z794 Long term (current) use of insulin: Secondary | ICD-10-CM

## 2016-08-14 LAB — BASIC METABOLIC PANEL
Anion gap: 10 (ref 5–15)
BUN: 59 mg/dL — ABNORMAL HIGH (ref 6–23)
BUN: 62 mg/dL — ABNORMAL HIGH (ref 6–20)
CALCIUM: 8.8 mg/dL — AB (ref 8.9–10.3)
CO2: 29 mEq/L (ref 19–32)
CO2: 27 mmol/L (ref 22–32)
CREATININE: 2.49 mg/dL — AB (ref 0.61–1.24)
Calcium: 9.1 mg/dL (ref 8.4–10.5)
Chloride: 93 mEq/L — ABNORMAL LOW (ref 96–112)
Chloride: 93 mmol/L — ABNORMAL LOW (ref 101–111)
Creatinine, Ser: 2.77 mg/dL — ABNORMAL HIGH (ref 0.40–1.50)
GFR calc Af Amer: 25 mL/min — ABNORMAL LOW (ref >60)
GFR calc non Af Amer: 22 mL/min — ABNORMAL LOW (ref >60)
GFR: 23.18 mL/min — ABNORMAL LOW (ref >60.00)
GLUCOSE: 300 mg/dL — AB (ref 65–99)
Glucose, Bld: 275 mg/dL — ABNORMAL HIGH (ref 70–99)
POTASSIUM: 4.7 meq/L (ref 3.5–5.1)
Potassium: 4.4 mmol/L (ref 3.5–5.1)
SODIUM: 129 meq/L — AB (ref 135–145)
Sodium: 130 mmol/L — ABNORMAL LOW (ref 135–145)

## 2016-08-14 LAB — CBC WITH DIFFERENTIAL/PLATELET
Basophils Absolute: 0.1 10*3/uL (ref 0.0–0.1)
Basophils Relative: 1 % (ref 0.0–3.0)
EOS ABS: 0.5 10*3/uL (ref 0.0–0.7)
Eosinophils Relative: 7.1 % — ABNORMAL HIGH (ref 0.0–5.0)
HCT: 23.6 % — CL (ref 39.0–52.0)
Hemoglobin: 7.7 g/dL — CL (ref 13.0–17.0)
Lymphocytes Relative: 9.9 % — ABNORMAL LOW (ref 12.0–46.0)
Lymphs Abs: 0.6 10*3/uL — ABNORMAL LOW (ref 0.7–4.0)
MCHC: 32.5 g/dL (ref 30.0–36.0)
MCV: 83.4 fl (ref 78.0–100.0)
MONO ABS: 0.9 10*3/uL (ref 0.1–1.0)
Monocytes Relative: 14 % — ABNORMAL HIGH (ref 3.0–12.0)
NEUTROS ABS: 4.4 10*3/uL (ref 1.4–7.7)
Neutrophils Relative %: 68 % (ref 43.0–77.0)
PLATELETS: 211 10*3/uL (ref 150.0–400.0)
RBC: 2.83 Mil/uL — ABNORMAL LOW (ref 4.22–5.81)
RDW: 17.3 % — AB (ref 11.5–15.5)
WBC: 6.5 10*3/uL (ref 4.0–10.5)

## 2016-08-14 LAB — CBC
HCT: 25 % — ABNORMAL LOW (ref 40.0–52.0)
Hemoglobin: 8.1 g/dL — ABNORMAL LOW (ref 13.0–18.0)
MCH: 26.9 pg (ref 26.0–34.0)
MCHC: 32.3 g/dL (ref 32.0–36.0)
MCV: 83.5 fL (ref 80.0–100.0)
Platelets: 212 10*3/uL (ref 150–440)
RBC: 2.99 MIL/uL — ABNORMAL LOW (ref 4.40–5.90)
RDW: 17.5 % — AB (ref 11.5–14.5)
WBC: 6.8 10*3/uL (ref 3.8–10.6)

## 2016-08-14 LAB — PREPARE RBC (CROSSMATCH)

## 2016-08-14 LAB — TROPONIN I

## 2016-08-14 LAB — BRAIN NATRIURETIC PEPTIDE: B Natriuretic Peptide: 251 pg/mL — ABNORMAL HIGH (ref 0.0–100.0)

## 2016-08-14 LAB — MAGNESIUM: Magnesium: 2.3 mg/dL (ref 1.7–2.4)

## 2016-08-14 MED ORDER — ACETAMINOPHEN 325 MG PO TABS
650.0000 mg | ORAL_TABLET | Freq: Four times a day (QID) | ORAL | Status: DC | PRN
  Administered 2016-08-15 – 2016-08-16 (×2): 650 mg via ORAL
  Filled 2016-08-14 (×3): qty 2

## 2016-08-14 MED ORDER — INSULIN GLARGINE 100 UNIT/ML ~~LOC~~ SOLN
35.0000 [IU] | Freq: Every day | SUBCUTANEOUS | Status: DC
  Administered 2016-08-15: 35 [IU] via SUBCUTANEOUS
  Filled 2016-08-14 (×2): qty 0.35

## 2016-08-14 MED ORDER — CALCITRIOL 0.25 MCG PO CAPS
0.2500 ug | ORAL_CAPSULE | ORAL | Status: DC
  Administered 2016-08-16: 0.25 ug via ORAL
  Filled 2016-08-14: qty 1

## 2016-08-14 MED ORDER — HYDROCODONE-ACETAMINOPHEN 5-325 MG PO TABS: 1.0000 | ORAL_TABLET | Freq: Every day | ORAL | Status: DC | PRN

## 2016-08-14 MED ORDER — INSULIN ASPART 100 UNIT/ML ~~LOC~~ SOLN: 0.0000 [IU] | Freq: Three times a day (TID) | SUBCUTANEOUS | Status: DC

## 2016-08-14 MED ORDER — VITAMIN D 1000 UNITS PO TABS
1000.0000 [IU] | ORAL_TABLET | Freq: Every day | ORAL | Status: DC
  Administered 2016-08-15 – 2016-08-17 (×3): 1000 [IU] via ORAL
  Filled 2016-08-14 (×3): qty 1

## 2016-08-14 MED ORDER — POLYETHYLENE GLYCOL 3350 17 G PO PACK
17.0000 g | PACK | Freq: Every day | ORAL | Status: DC | PRN
  Administered 2016-08-15: 17 g via ORAL
  Filled 2016-08-14: qty 1

## 2016-08-14 MED ORDER — ADULT MULTIVITAMIN W/MINERALS CH
1.0000 | ORAL_TABLET | Freq: Every day | ORAL | Status: DC
  Administered 2016-08-15 – 2016-08-17 (×3): 1 via ORAL
  Filled 2016-08-14 (×3): qty 1

## 2016-08-14 MED ORDER — GUAIFENESIN-DM 100-10 MG/5ML PO SYRP
5.0000 mL | ORAL_SOLUTION | ORAL | Status: DC | PRN
  Administered 2016-08-14: 5 mL via ORAL
  Filled 2016-08-14: qty 5

## 2016-08-14 MED ORDER — LORATADINE 10 MG PO TABS
10.0000 mg | ORAL_TABLET | Freq: Every day | ORAL | Status: DC | PRN
  Administered 2016-08-14: 10 mg via ORAL
  Filled 2016-08-14: qty 1

## 2016-08-14 MED ORDER — BISACODYL 10 MG RE SUPP: 10.0000 mg | Freq: Every day | RECTAL | Status: DC | PRN

## 2016-08-14 MED ORDER — ASPIRIN EC 81 MG PO TBEC
81.0000 mg | DELAYED_RELEASE_TABLET | Freq: Every day | ORAL | Status: DC
  Administered 2016-08-15 – 2016-08-17 (×3): 81 mg via ORAL
  Filled 2016-08-14 (×3): qty 1

## 2016-08-14 MED ORDER — ONDANSETRON HCL 4 MG/2ML IJ SOLN: 4.0000 mg | Freq: Four times a day (QID) | INTRAMUSCULAR | Status: DC | PRN

## 2016-08-14 MED ORDER — TAMSULOSIN HCL 0.4 MG PO CAPS
0.4000 mg | ORAL_CAPSULE | Freq: Every day | ORAL | Status: DC
  Administered 2016-08-14 – 2016-08-17 (×4): 0.4 mg via ORAL
  Filled 2016-08-14 (×4): qty 1

## 2016-08-14 MED ORDER — SENNA 8.6 MG PO TABS
1.0000 | ORAL_TABLET | Freq: Two times a day (BID) | ORAL | Status: DC
  Administered 2016-08-14 – 2016-08-17 (×6): 8.6 mg via ORAL
  Filled 2016-08-14 (×6): qty 1

## 2016-08-14 MED ORDER — ESCITALOPRAM OXALATE 10 MG PO TABS
10.0000 mg | ORAL_TABLET | Freq: Every day | ORAL | Status: DC
  Administered 2016-08-15 – 2016-08-17 (×3): 10 mg via ORAL
  Filled 2016-08-14 (×3): qty 1

## 2016-08-14 MED ORDER — TRAMADOL HCL 50 MG PO TABS: 50.0000 mg | ORAL_TABLET | Freq: Four times a day (QID) | ORAL | Status: DC | PRN

## 2016-08-14 MED ORDER — INSULIN ASPART 100 UNIT/ML ~~LOC~~ SOLN
0.0000 [IU] | Freq: Three times a day (TID) | SUBCUTANEOUS | Status: DC
  Administered 2016-08-15: 9 [IU] via SUBCUTANEOUS
  Administered 2016-08-15: 7 [IU] via SUBCUTANEOUS
  Administered 2016-08-15: 2 [IU] via SUBCUTANEOUS
  Administered 2016-08-16: 9 [IU] via SUBCUTANEOUS
  Filled 2016-08-14: qty 2
  Filled 2016-08-14: qty 9
  Filled 2016-08-14: qty 7
  Filled 2016-08-14: qty 9

## 2016-08-14 MED ORDER — FUROSEMIDE 10 MG/ML IJ SOLN
40.0000 mg | Freq: Two times a day (BID) | INTRAMUSCULAR | Status: DC
  Administered 2016-08-15 – 2016-08-16 (×4): 40 mg via INTRAVENOUS
  Filled 2016-08-14 (×4): qty 4

## 2016-08-14 MED ORDER — OCUVITE-LUTEIN PO CAPS
1.0000 | ORAL_CAPSULE | Freq: Two times a day (BID) | ORAL | Status: DC
  Administered 2016-08-15 – 2016-08-17 (×5): 1 via ORAL
  Filled 2016-08-14 (×6): qty 1

## 2016-08-14 MED ORDER — SODIUM CHLORIDE 0.9 % IV SOLN: 10.0000 mL/h | Freq: Once | INTRAVENOUS | Status: DC

## 2016-08-14 MED ORDER — KETOTIFEN FUMARATE 0.025 % OP SOLN: 1.0000 [drp] | Freq: Two times a day (BID) | OPHTHALMIC | Status: DC

## 2016-08-14 MED ORDER — ENOXAPARIN SODIUM 30 MG/0.3ML ~~LOC~~ SOLN
30.0000 mg | SUBCUTANEOUS | Status: DC
  Administered 2016-08-14 – 2016-08-16 (×3): 30 mg via SUBCUTANEOUS
  Filled 2016-08-14 (×3): qty 0.3

## 2016-08-14 MED ORDER — ACETAMINOPHEN 650 MG RE SUPP: 650.0000 mg | Freq: Four times a day (QID) | RECTAL | Status: DC | PRN

## 2016-08-14 MED ORDER — VITAMIN C 500 MG PO TABS
500.0000 mg | ORAL_TABLET | Freq: Every day | ORAL | Status: DC
  Administered 2016-08-15 – 2016-08-17 (×3): 500 mg via ORAL
  Filled 2016-08-14 (×3): qty 1

## 2016-08-14 MED ORDER — HYDROXYCHLOROQUINE SULFATE 200 MG PO TABS
200.0000 mg | ORAL_TABLET | Freq: Every day | ORAL | Status: DC
  Administered 2016-08-15 – 2016-08-17 (×3): 200 mg via ORAL
  Filled 2016-08-14 (×4): qty 1

## 2016-08-14 MED ORDER — ONDANSETRON HCL 4 MG PO TABS
4.0000 mg | ORAL_TABLET | Freq: Four times a day (QID) | ORAL | Status: DC | PRN
Start: 2016-08-14 — End: 2016-08-17

## 2016-08-14 MED ORDER — LORATADINE 10 MG PO TABS: 10.0000 mg | ORAL_TABLET | Freq: Every day | ORAL | Status: DC

## 2016-08-14 MED ORDER — ISOSORBIDE MONONITRATE ER 30 MG PO TB24
30.0000 mg | ORAL_TABLET | Freq: Every day | ORAL | Status: DC
  Administered 2016-08-15 – 2016-08-17 (×3): 30 mg via ORAL
  Filled 2016-08-14 (×3): qty 1

## 2016-08-14 MED ORDER — ALPRAZOLAM 0.25 MG PO TABS: 0.2500 mg | ORAL_TABLET | Freq: Every evening | ORAL | Status: DC | PRN

## 2016-08-14 MED ORDER — PANTOPRAZOLE SODIUM 40 MG PO TBEC
40.0000 mg | DELAYED_RELEASE_TABLET | Freq: Every day | ORAL | Status: DC
  Administered 2016-08-15 – 2016-08-17 (×3): 40 mg via ORAL
  Filled 2016-08-14 (×3): qty 1

## 2016-08-14 MED ORDER — FUROSEMIDE 10 MG/ML IJ SOLN
80.0000 mg | Freq: Once | INTRAMUSCULAR | Status: AC
  Administered 2016-08-14: 80 mg via INTRAVENOUS
  Filled 2016-08-14: qty 8

## 2016-08-14 MED ORDER — KETOTIFEN FUMARATE 0.025 % OP SOLN
1.0000 [drp] | Freq: Two times a day (BID) | OPHTHALMIC | Status: DC | PRN
  Administered 2016-08-15 (×2): 1 [drp] via OPHTHALMIC
  Filled 2016-08-14 (×2): qty 5

## 2016-08-14 MED ORDER — CARVEDILOL 6.25 MG PO TABS
6.2500 mg | ORAL_TABLET | Freq: Two times a day (BID) | ORAL | Status: DC
  Administered 2016-08-14 – 2016-08-17 (×6): 6.25 mg via ORAL
  Filled 2016-08-14 (×6): qty 1

## 2016-08-14 MED ORDER — FERROUS SULFATE 325 (65 FE) MG PO TABS
325.0000 mg | ORAL_TABLET | Freq: Every day | ORAL | Status: DC
  Administered 2016-08-15 – 2016-08-17 (×3): 325 mg via ORAL
  Filled 2016-08-14 (×3): qty 1

## 2016-08-14 NOTE — Progress Notes (Signed)
Patient arrived to 2A Room 237. Patient denies pain and all questions answered. Patient oriented to unit and Fall Safety Plan signed. Skin assessment completed with Crystal RN and skin intact. A&Ox4, VSS, and NSR on verified tele-box #[id number]. Nursing staff will continue to monitor for any changes in patient status. Earleen Reaper, RN

## 2016-08-14 NOTE — ED Notes (Addendum)
Patient handling lasix well with no difficulties.  Condom Catheter placed on bag per patient's request due to difficulties using a urinal and walking to the toilet.

## 2016-08-14 NOTE — Addendum Note (Signed)
Addended by: [name] on: 08/14/2016 12:12 PM   Modules accepted: Orders

## 2016-08-14 NOTE — Telephone Encounter (Signed)
Patient came in for labs and advised lab concern for bilateral edema in both legs after taking 80 mg of furosemide at 7:30 AM, patient was triaged and these vital were attained  BP 110/50 pulse 72, 02 sat @ 98% on room air, Temp 97.9. Patient denied chest pain but has SOB with Exertion and while speaking 02 sat remained at 98% during conversation.  Advised PCP of findings and advice given to follow plan from phone note dated 08/13/16 . Patient was advised we would need to wait until lab work is back before we can address the edema in his legs.

## 2016-08-14 NOTE — ED Notes (Signed)
Admitting MD at bedside.

## 2016-08-14 NOTE — ED Triage Notes (Signed)
Pt here from home with increased SHOB, reports increased fluid in abdomen and lungs. Pt had hemoglobin drawn today, results pending. EMS reports CBG 355.

## 2016-08-14 NOTE — Progress Notes (Signed)
Pharmacist-Provider communication   Enoxaparin changed to 30mg  per CrCl <41ZB/FMZ and BMI <40.   Loree Fee, PharmD 7:32 PM 08/14/2016

## 2016-08-14 NOTE — ED Notes (Addendum)
CHF vest applied to patient by Estill Bamberg with Heart Failure clinic.  Patient tolerating well at this time. CHF vest had a result of 44%. MD notified.

## 2016-08-14 NOTE — Telephone Encounter (Addendum)
Called patient to follow up from phone call 08/13/16  fasting blood sugar today 300.  Took  9 units of Novolog.   He continued to monitor blood sugars last night and they ranged  98-120 . Still a lot of swelling in lower extremities on demadex 80 mg .   Shortness of breath with walking .  Coming in for lab work CBC at 11:00 am.

## 2016-08-14 NOTE — Telephone Encounter (Signed)
Drews labs for cbc and bmet today, did pt need INR check as well? Drew for it just incase.

## 2016-08-14 NOTE — Telephone Encounter (Signed)
Critical lab results from our lab:  HGB: 7.7 HCT: 23.6  (Note: It looks as though pt had labs drawn & resulted today at the hospital)
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
## 2016-08-14 NOTE — Telephone Encounter (Signed)
He can increase his novolog to 15 units before each meal if his BS is > 250 and increase his lantus to 45 units   His swelling is llikely due to heart failure from his hgb dropping again,  So I am not changing his diuretic dose until his hgb can be reviewed

## 2016-08-14 NOTE — Telephone Encounter (Signed)
INR was drawn separately from BMP and CBC just incase it was needed. BMP and CBC are in correct tubes as normal. Will discard blue top tube for INR.

## 2016-08-14 NOTE — Telephone Encounter (Signed)
No INR needed , no longer on warfarin, but can a BNP be  run from the tube,?   Lab Results  Component Value Date   INR 1.03 06/01/2016   INR 1.18 09/19/2015   INR 1.20 07/28/2015

## 2016-08-14 NOTE — Telephone Encounter (Signed)
Patient called back to office concerning lab results and that he is becoming even more SOB with walking has to sit down and catch breath just walking a couple of steps. Labs not available at this time.

## 2016-08-14 NOTE — Telephone Encounter (Signed)
  I have reviewed the above information and agree with above.   Vallarie Fei, MD 
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
## 2016-08-14 NOTE — ED Notes (Signed)
ED Provider at bedside. 

## 2016-08-14 NOTE — Telephone Encounter (Signed)
Patient advised of below and verbalized understanding.  

## 2016-08-14 NOTE — Telephone Encounter (Signed)
Per verbal called patient and patient agreed is calling EMS to go to ED, now.

## 2016-08-14 NOTE — Progress Notes (Signed)
Family Meeting Note  Advance Directiveyes  Today a meeting took place with the pt and family  The following were discussed:Patient's diagnosis: , Patient's progosis: Patient has multiple medical problems including chronic congestive heart failure diastolic with chronic anemia slow GI bleed chronic atrial fibrillation and COPD. This is patient's fifth admission in the last 6 months. Patient wants to be a full code. He understands his long-term prognosis is poor. I have discussed with patient and family regarding palliative care consult. Patient will discuss with his son and get back with [place].   Time spent during discussion: 15 minutes Kenneth Prins, MD

## 2016-08-15 LAB — HEMOGLOBIN: Hemoglobin: 9.3 g/dL — ABNORMAL LOW (ref 13.0–18.0)

## 2016-08-15 LAB — GLUCOSE, CAPILLARY
GLUCOSE-CAPILLARY: 338 mg/dL — AB (ref 65–99)
GLUCOSE-CAPILLARY: 177 mg/dL — AB (ref 65–99)
GLUCOSE-CAPILLARY: 205 mg/dL — AB (ref 65–99)
Glucose-Capillary: 215 mg/dL — ABNORMAL HIGH (ref 65–99)
Glucose-Capillary: 363 mg/dL — ABNORMAL HIGH (ref 65–99)

## 2016-08-15 MED ORDER — INSULIN ASPART 100 UNIT/ML ~~LOC~~ SOLN
4.0000 [IU] | Freq: Three times a day (TID) | SUBCUTANEOUS | Status: DC
  Administered 2016-08-15 – 2016-08-17 (×4): 4 [IU] via SUBCUTANEOUS
  Filled 2016-08-15 (×4): qty 4

## 2016-08-15 MED ORDER — SODIUM CHLORIDE 0.9% FLUSH
3.0000 mL | Freq: Two times a day (BID) | INTRAVENOUS | Status: DC
  Administered 2016-08-15 – 2016-08-17 (×4): 3 mL via INTRAVENOUS

## 2016-08-15 MED ORDER — CEPASTAT 14.5 MG MT LOZG: 1.0000 | LOZENGE | OROMUCOSAL | Status: DC | PRN

## 2016-08-15 MED ORDER — SODIUM CHLORIDE 0.9% FLUSH
3.0000 mL | INTRAVENOUS | Status: DC | PRN
Start: 2016-08-15 — End: 2016-08-17
  Administered 2016-08-16: 3 mL via INTRAVENOUS
  Filled 2016-08-15: qty 3

## 2016-08-15 NOTE — Progress Notes (Signed)
Patient remains alert and oriennted, sitting in the recliner chair, denies any pain, rounded with MD at bedside, patient updated about plan of care , will continue to monitor

## 2016-08-15 NOTE — Progress Notes (Signed)
[facility]  Patient Status: Active  [facility] is providing the following services: SN/PT/OT  If patient discharges after hours, please call [phone].   [name] 08/15/2016, 9:01 AM

## 2016-08-15 NOTE — Evaluation (Signed)
Physical Therapy Evaluation Patient Details [name] MRN: [id number] DOB: 22-Apr-1928 Today's Date: 08/15/2016   History of Present Illness  Pt admitted for complaints of SOB symptoms and weight gain. Pt with history of diastolic CHF, HTN, DM, and CAD s/p CABG. Pt currently still complaining of SOB symptoms.  Clinical Impression  Pt is a pleasant 83 year old male who was admitted for complains of SOB symptoms and weight gain. Pt performs bed mobility, transfers, and ambulation with cga and RW. Pt slightly limited in ambulation secondary to SOB symptoms and pain in feet. Pt demonstrates deficits with strength/balance/endurance. Pt has good support at home and appears close to baseline. Would benefit from skilled PT to address above deficits and promote optimal return to PLOF. Recommend transition to [facility] upon discharge from acute hospitalization.       Follow Up Recommendations Home health PT    Equipment Recommendations  None recommended by PT (has all equipment)    Recommendations for Other Services       Precautions / Restrictions Precautions Precautions: Fall Restrictions Weight Bearing Restrictions: No      Mobility  Bed Mobility Overal bed mobility: Needs Assistance Bed Mobility: Supine to Sit     Supine to sit: Min guard     General bed mobility comments: safe technique performed with pt able to scoot towards EOB. Slight impulsive technique noted with cues to wait for therapist  Transfers Overall transfer level: Needs assistance Equipment used: Rolling walker (2 wheeled) Transfers: Sit to/from Stand Sit to Stand: Min guard         General transfer comment: safe technique performed with upright posture. Safe technique with RW  Ambulation/Gait Ambulation/Gait assistance: Min guard Ambulation Distance (Feet): 50 Feet Assistive device: Rolling walker (2 wheeled) Gait Pattern/deviations: Step-through pattern     General Gait Details: Pt able to  ambulate with reciprocal gait pattern. Safe technique performed using RW, however pt complains of SOB symptoms. O2 sats at 98% on room air. Pt also complains of B feet swelling and painful with ambulation.  Stairs            Wheelchair Mobility    Modified Rankin (Stroke Patients Only)       Balance Overall balance assessment: Needs assistance Sitting-balance support: Feet supported Sitting balance-[name]: Good     Standing balance support: Bilateral upper extremity supported Standing balance-[name]: Fair                               Pertinent Vitals/Pain Pain Assessment: No/denies pain    Home Living Family/patient expects to be discharged to:: Private residence Living Arrangements: Alone Available Help at Discharge: Personal care attendant Type of Home: House Home Access: Stairs to enter Entrance Stairs-Rails:  (has grab bars installed but no banister) Technical brewer of Steps: 3 Home Layout: Able to live on main level with bedroom/bathroom;Two level Home Equipment: Walker - 2 wheels;Walker - 4 wheels;Cane - single point Additional Comments: Pt currently has caregivers 7 days/week for ADLs    Prior Function Level of Independence: Independent with assistive device(s)         Comments: Pt uses RW for all mobility and rollater for community distances     Hand Dominance   Dominant Hand: Right    Extremity/Trunk Assessment   Upper Extremity Assessment Upper Extremity Assessment: Generalized weakness (grossly B UE grossly 3+/5)    Lower Extremity Assessment Lower Extremity Assessment: Generalized  weakness (grossly B LE 4/5)       Communication   Communication: No difficulties  Cognition Arousal/Alertness: Awake/alert Behavior During Therapy: WFL for tasks assessed/performed Overall Cognitive Status: Within Functional Limits for tasks assessed                      General Comments      Exercises      Assessment/Plan    PT Assessment Patient needs continued PT services  PT Problem List Decreased strength;Decreased activity tolerance;Decreased balance;Decreased mobility       PT Treatment Interventions Gait training;DME instruction;Therapeutic exercise    PT Goals (Current goals can be found in the Care Plan section)  Acute Rehab PT Goals Patient Stated Goal: to become less SOB  PT Goal Formulation: With patient Time For Goal Achievement: 08/29/16 Potential to Achieve Goals: Good    Frequency Min 2X/week   Barriers to discharge        Co-evaluation               End of Session Equipment Utilized During Treatment: Gait belt Activity Tolerance: Patient tolerated treatment well Patient left: in chair;with chair alarm set Nurse Communication: Mobility status PT Visit Diagnosis: Unsteadiness on feet (R26.81);Muscle weakness (generalized) (M62.81)         Time: 4709-6283 PT Time Calculation (min) (ACUTE ONLY): 20 min   Charges:   PT Evaluation $PT Eval Moderate Complexity: 1 Procedure     PT G Codes:         [name] 22-Aug-2016, 10:33 AM  Greggory Stallion, PT, DPT [phone]

## 2016-08-15 NOTE — Care Management (Signed)
Admitted to this facility with the diagnosis of CHF. Lives alone. Son is Doren Custard [phone]). Last seen Dr. Derrel Nip in February. Followed by [facility] at present for nursing, physical therapy, and occupational therapy. [facility] in January 2018 for short term skilled nursing. Caregiver in the home.  Shelbie Ammons RN MSN CCM Care Management

## 2016-08-15 NOTE — Progress Notes (Signed)
Patient [name] Date of Encounter: 08/15/2016  Primary Cardiologist: [facility] Problem List     Active Problems:   CHF (congestive heart failure) (HCC)     Subjective   SOB improved with RBC transfusion. HGB stable this morning. No renal function this morning. Remains on IV Lasix 80 mg bid. Weight down 5-7 pounds for the admission, though remains + 3 pounds when compared to last discharge weight.   Inpatient Medications    Scheduled Meds: . aspirin EC  81 mg Oral Daily  . [START ON 08/16/2016] calcitRIOL  0.25 mcg Oral Once per day on Mon Wed Fri  . carvedilol  6.25 mg Oral BID  . cholecalciferol  1,000 Units Oral Daily  . enoxaparin (LOVENOX) injection  30 mg Subcutaneous Q24H  . escitalopram  10 mg Oral Daily  . ferrous sulfate  325 mg Oral Q breakfast  . furosemide  40 mg Intravenous BID  . hydroxychloroquine  200 mg Oral Daily  . insulin aspart  0-9 Units Subcutaneous TID WC  . insulin glargine  35 Units Subcutaneous Daily  . isosorbide mononitrate  30 mg Oral Daily  . multivitamin with minerals  1 tablet Oral Daily  . multivitamin-lutein  1 capsule Oral BID  . pantoprazole  40 mg Oral Daily  . senna  1 tablet Oral BID  . tamsulosin  0.4 mg Oral Daily  . vitamin C  500 mg Oral Daily   Continuous Infusions:  PRN Meds: acetaminophen **OR** acetaminophen, ALPRAZolam, bisacodyl, guaiFENesin-dextromethorphan, HYDROcodone-acetaminophen, ketotifen, loratadine, ondansetron **OR** ondansetron (ZOFRAN) IV, phenol-menthol, polyethylene glycol, traMADol   Vital Signs    Vitals:   08/14/16 2335 08/15/16 0205 08/15/16 0418 08/15/16 0756  BP: (!) 148/50 (!) 141/65 136/60 (!) 153/66  Pulse: 69 (!) 109 78 74  Resp: 18 18 16 16   Temp: 97.7 F (36.5 C) 98.2 F (36.8 C) 98.3 F (36.8 C) 98.1 F (36.7 C)  TempSrc: Oral Oral Oral Oral  SpO2: 95% 96% 92% 94%  Weight:      Height:        Intake/Output Summary (Last 24 hours) at 08/15/16 1111 Last data  filed at 08/15/16 1100  Gross per 24 hour  Intake              310 ml  Output             3740 ml  Net            -3430 ml   Filed Weights   08/14/16 1438 08/14/16 1928  Weight: 254 lb (115.2 kg) 243 lb 12.8 oz (110.6 kg)    Physical Exam    GEN: Well nourished, well developed, in no acute distress.  HEENT: Grossly normal.  Neck: Supple, JVD elevated ~ 10 cm, no carotid bruits, or masses. Cardiac: Irregularly irregular, II/VI systolic murmur at LLSB, no rubs, or gallops. No clubbing, cyanosis, 2+ pitting LE edema with mild erythema.  Radials/DP/PT 2+ and equal bilaterally.  Respiratory:  Mildly diminished breath sounds bilaterally with faint bibasilar crackles. GI: Soft, nontender, nondistended, BS + x 4. MS: no deformity or atrophy. Skin: warm and dry, no rash. Neuro:  Strength and sensation are intact. Psych: AAOx3.  Normal affect.  Labs    CBC  Recent Labs  08/14/16 1058 08/14/16 1437 08/15/16 0419  WBC 6.5 6.8  --   NEUTROABS 4.4  --   --   HGB 7.7 Repeated and verified X2.* 8.1* 9.3*  HCT 23.6  Repeated and verified X2.* 25.0*  --   MCV 83.4 83.5  --   PLT 211.0 212  --    Basic Metabolic Panel  Recent Labs  08/14/16 1058 08/14/16 1437  NA 129* 130*  K 4.7 4.4  CL 93* 93*  CO2 29 27  GLUCOSE 275* 300*  BUN 59* 62*  CREATININE 2.77* 2.49*  CALCIUM 9.1 8.8*  MG  --  2.3   Liver Function Tests No results for input(s): AST, ALT, ALKPHOS, BILITOT, PROT, ALBUMIN in the last 72 hours. No results for input(s): LIPASE, AMYLASE in the last 72 hours. Cardiac Enzymes  Recent Labs  08/14/16 1437  TROPONINI <0.03   BNP Invalid input(s): POCBNP D-Dimer No results for input(s): DDIMER in the last 72 hours. Hemoglobin A1C No results for input(s): HGBA1C in the last 72 hours. Fasting Lipid Panel No results for input(s): CHOL, HDL, LDLCALC, TRIG, CHOLHDL, LDLDIRECT in the last 72 hours. Thyroid Function Tests No results for input(s): TSH, T4TOTAL, T3FREE,  THYROIDAB in the last 72 hours.  Invalid input(s): FREET3  Telemetry    Afib, 70's bpm, occasional PVCs - Personally Reviewed  ECG    n/a - Personally Reviewed  Radiology    Dg Chest 2 View  Result Date: 08/14/2016 CLINICAL DATA:  83 year old male with increased shortness of breath. Fluid retention. Initial encounter. EXAM: CHEST  2 VIEW COMPARISON:  07/16/2016 and earlier. FINDINGS: Seated AP and lateral views of the chest. Small right pleural effusion appears stable to mildly increased. Stable cardiomegaly and mediastinal contours. Calcified aortic atherosclerosis. Visualized tracheal air column is within normal limits. Acute on chronic increased pulmonary vascularity. No pneumothorax. No consolidation. Prosthetic cardiac valve. No acute osseous abnormality identified. Negative visible bowel gas pattern. IMPRESSION: Stable to mild progression since February of small right pleural effusion and vascular congestion/interstitial edema. Electronically Signed   By: Genevie Ann M.D.   On: 08/14/2016 15:07    Cardiac Studies   TTE 03/2016: Study Conclusions  - Left ventricle: The cavity size was normal. There was mild   concentric hypertrophy. Systolic function was normal. The   estimated ejection fraction was in the range of 55% to 60%. Wall   motion was normal; there were no regional wall motion   abnormalities. The study is not technically sufficient to allow   evaluation of LV diastolic function. - Aortic valve: A bioprosthesis was present. Transvalvular velocity   was within the normal range for prosthetic valve Peak velocity   (S): 241 cm/s. Mean gradient (S): 14 mm Hg. Peak gradient (S): 23   mm Hg. - Left atrium: The atrium was mild to moderately dilated. - Right ventricle: Systolic function was normal. - Pulmonary arteries: Systolic pressure could not be accurately   estimated. - Inferior vena cava: The vessel was dilated. The respirophasic   diameter changes were blunted (<  50%), consistent with elevated   central venous pressure.  Impressions:  - Rhythm is atrial fibrillation.  Recommendations:  Challenging image quality.  Patient Profile     83 y.o. male with history of CAD s/p CABG x 1 in 07/2007, chronic Afib s/p Watchman device in 08/2015 previously on Eliquis with GI bleed noted in the setting of AV malformations, severe aortic stenosis s/p bioprosthetic AVR, chronic diastolic CHF, stroke, and bladder cancer, who presented to [facility] ED With progressive leg swelling and shortness of breath.  Assessment & Plan    1. Acute respiratory distress: -Likely multifactorial including volume overload with acute on chronic  diastolic CHF and symptomatic anemia -Breathing better with 1 unit pRBC in the ED as well as IV diuresis  2. Acute on chronic diastolic CHF: -Breathing improving -Continue IV Lasix 40 mg bid, check bmet this morning to evaluate for stable renal function -Continue Coreg 6.25 mg bid  3. Anemia: -Status post 1 unit pRBC -HGB stable -ASA only  4. Permanent Afib: -Rate controlled -Status post Watchman for stroke PPX -Not on full-dose anticoagulation 2/2 GI bleed -Continue ASA  5. CAD: -No symptoms concerning for angina -Continue SA, Coreg, and Imdur -Consider statin as an outpatient -No plans for inpatient ischemic evaluation  6. S/p bioprosthetic AVR: -Outpatient follow up  7. CKD stage IV: -Bmet pending this morning  Signed, Marcille Blanco [place] Pager: [phone] 08/15/2016, 11:11 AM

## 2016-08-15 NOTE — Progress Notes (Signed)
[facility] [place] [name]    MR#:  [id number]  DATE OF BIRTH:  [birth year]  SUBJECTIVE:  CHIEF COMPLAINT:   Chief Complaint  Patient presents with  . Shortness of Breath   - admitted with CHF exacerbation, still has orthopnea, dyspnea and leg swelling - on IV lasix  REVIEW OF SYSTEMS:  Review of Systems  Constitutional: Negative for chills, fever and malaise/fatigue.  HENT: Positive for hearing loss. Negative for congestion, ear discharge and nosebleeds.   Eyes: Negative for blurred vision and double vision.  Respiratory: Positive for cough and shortness of breath. Negative for wheezing.   Cardiovascular: Positive for leg swelling. Negative for chest pain and palpitations.  Gastrointestinal: Negative for abdominal pain, constipation, diarrhea, nausea and vomiting.  Genitourinary: Negative for dysuria.  Musculoskeletal: Negative for myalgias and neck pain.  Neurological: Negative for dizziness, sensory change, speech change, focal weakness, seizures and headaches.  Psychiatric/Behavioral: Negative for depression.    DRUG ALLERGIES:   Allergies  Allergen Reactions  . Asa [Aspirin] Other (See Comments)    Reaction:  Caused stomach ulcer patient can take 81 mg.  . Losartan Other (See Comments)    Decreased pulse rate  . Metoprolol Other (See Comments)    Reaction:  Bradycardia   . Penicillins Swelling and Other (See Comments)    Has patient had a PCN reaction causing immediate rash, facial/tongue/throat swelling, SOB or lightheadedness with hypotension: No Has patient had a PCN reaction causing severe rash involving mucus membranes or skin necrosis: No Has patient had a PCN reaction that required hospitalization No Has patient had a PCN reaction occurring within the last 10 years: No If all of the above answers are "NO", then may proceed with Cephalosporin use.    VITALS:  Blood pressure (!) 141/61, pulse 63,  temperature 98.1 F (36.7 C), temperature source Oral, resp. rate 14, height 5\' 10"  (1.778 m), weight 110.6 kg (243 lb 12.8 oz), SpO2 100 %.  PHYSICAL EXAMINATION:  Physical Exam  GENERAL:  83 y.o.-year-old patient sitting in the bed with no acute distress.  EYES: Pupils equal, round, reactive to light and accommodation. No scleral icterus. Extraocular muscles intact.  HEENT: Head atraumatic, normocephalic. Oropharynx and nasopharynx clear.  NECK:  Supple, no jugular venous distention. No thyroid enlargement, no tenderness.  LUNGS: Normal breath sounds bilaterally, no wheezing, rhonchi or crepitation. No use of accessory muscles of respiration. Fine bibasilar crackles heard CARDIOVASCULAR: S1, S2 normal. No rubs, or gallops. 3/6 systolic murmur present ABDOMEN: Soft, nontender, nondistended. Bowel sounds present. No organomegaly or mass.  EXTREMITIES: No cyanosis, or clubbing. 2+ pedal edema present. NEUROLOGIC: Cranial nerves II through XII are intact. Muscle strength 5/5 in all extremities. Sensation intact. Gait not checked.  PSYCHIATRIC: The patient is alert and oriented x 3.  SKIN: No obvious rash, lesion, or ulcer.    LABORATORY PANEL:   CBC  Recent Labs Lab 08/14/16 1437 08/15/16 0419  WBC 6.8  --   HGB 8.1* 9.3*  HCT 25.0*  --   PLT 212  --    ------------------------------------------------------------------------------------------------------------------  Chemistries   Recent Labs Lab 08/14/16 1437  NA 130*  K 4.4  CL 93*  CO2 27  GLUCOSE 300*  BUN 62*  CREATININE 2.49*  CALCIUM 8.8*  MG 2.3   ------------------------------------------------------------------------------------------------------------------  Cardiac Enzymes  Recent Labs Lab 08/14/16 1437  TROPONINI <0.03   ------------------------------------------------------------------------------------------------------------------  RADIOLOGY:  Dg Chest 2 View  Result Date:  08/14/2016 CLINICAL DATA:  83 year old male with increased shortness of breath. Fluid retention. Initial encounter. EXAM: CHEST  2 VIEW COMPARISON:  07/16/2016 and earlier. FINDINGS: Seated AP and lateral views of the chest. Small right pleural effusion appears stable to mildly increased. Stable cardiomegaly and mediastinal contours. Calcified aortic atherosclerosis. Visualized tracheal air column is within normal limits. Acute on chronic increased pulmonary vascularity. No pneumothorax. No consolidation. Prosthetic cardiac valve. No acute osseous abnormality identified. Negative visible bowel gas pattern. IMPRESSION: Stable to mild progression since February of small right pleural effusion and vascular congestion/interstitial edema. Electronically Signed   By: Genevie Ann M.D.   On: 08/14/2016 15:07    EKG:   Orders placed or performed during the hospital encounter of 08/14/16  . ED EKG within 10 minutes  . ED EKG within 10 minutes    ASSESSMENT AND PLAN:   83 year old male with past medical history significant for diastolic congestive heart failure, CAD status post CABG, aortic valve replacement surgery with bioprosthetic valve, hypertension, hyperlipidemia, atrial fibrillation presents to the hospital secondary to worsening shortness of breath and pedal edema. Also noted to be anemic.  #1 acute on chronic diastolic CHF exacerbation-continue IV diuresis, on lasix 40mg  IV BID - monitor renal function closely, strict input and output monitoring - Wean off oxygen. -Appreciate cardiology consult. No plans to repeat echocardiogram  #2 anemia-I daily on chronic. No active bleeding noted. Previously was on eliquis which was discontinued. -1 unit packed RBC given and hemoglobin is greater than 9 today. Monitor closely. Continue low-dose aspirin -On iron supplements  #3 atrial fibrillation-permanent A. fib. Rate is controlled. Not on anticoagulation due to GI bleed. Continue low-dose aspirin. -Continue  carvedilol  #4 diabetes mellitus-on Lantus and sliding scale insulin. If sugars are still elevated, add NovoLog pre-meals.  #5 CAD-stable at this time. Continue Imdur, aspirin, Coreg  #6 DVT prophylaxis-on Lovenox   Physical therapy consulted    All the records are reviewed and case discussed with Care Management/Social Workerr. Management plans discussed with the patient, family and they are in agreement.  CODE STATUS: Full code  TOTAL TIME TAKING CARE OF THIS PATIENT: 37 minutes.   POSSIBLE D/C IN 2 DAYS, DEPENDING ON CLINICAL CONDITION.   Gladstone Lighter M.D on 08/15/2016 at 2:28 PM  Between 7am to 6pm - Pager - [phone]  After 6pm go to [email or web address] - password [email or web address] [facility]  Office  [phone]  CC: Primary care physician; [name], MD

## 2016-08-16 ENCOUNTER — Other Ambulatory Visit: Payer: Medicare Other

## 2016-08-16 ENCOUNTER — Ambulatory Visit: Payer: Medicare Other

## 2016-08-16 ENCOUNTER — Inpatient Hospital Stay: Payer: Medicare Other

## 2016-08-16 LAB — CBC
HCT: 26.2 % — ABNORMAL LOW (ref 40.0–52.0)
HEMOGLOBIN: 8.6 g/dL — AB (ref 13.0–18.0)
MCH: 27.2 pg (ref 26.0–34.0)
MCHC: 32.8 g/dL (ref 32.0–36.0)
MCV: 83 fL (ref 80.0–100.0)
PLATELETS: 217 10*3/uL (ref 150–440)
RBC: 3.15 MIL/uL — ABNORMAL LOW (ref 4.40–5.90)
RDW: 16.9 % — AB (ref 11.5–14.5)
WBC: 6 10*3/uL (ref 3.8–10.6)

## 2016-08-16 LAB — PREPARE RBC (CROSSMATCH)

## 2016-08-16 LAB — GLUCOSE, CAPILLARY
GLUCOSE-CAPILLARY: 360 mg/dL — AB (ref 65–99)
GLUCOSE-CAPILLARY: 313 mg/dL — AB (ref 65–99)
GLUCOSE-CAPILLARY: 89 mg/dL (ref 65–99)
GLUCOSE-CAPILLARY: 282 mg/dL — AB (ref 65–99)

## 2016-08-16 LAB — BASIC METABOLIC PANEL
Anion gap: 8 (ref 5–15)
BUN: 58 mg/dL — AB (ref 6–20)
CALCIUM: 8.7 mg/dL — AB (ref 8.9–10.3)
CHLORIDE: 94 mmol/L — AB (ref 101–111)
CO2: 29 mmol/L (ref 22–32)
CREATININE: 2.25 mg/dL — AB (ref 0.61–1.24)
GFR calc Af Amer: 28 mL/min — ABNORMAL LOW (ref >60)
GFR calc non Af Amer: 25 mL/min — ABNORMAL LOW (ref >60)
Glucose, Bld: 298 mg/dL — ABNORMAL HIGH (ref 65–99)
Potassium: 4.6 mmol/L (ref 3.5–5.1)
SODIUM: 131 mmol/L — AB (ref 135–145)

## 2016-08-16 LAB — HEMOGLOBIN A1C
Hgb A1c MFr Bld: 6.4 % — ABNORMAL HIGH (ref 4.8–5.6)
MEAN PLASMA GLUCOSE: 137 mg/dL

## 2016-08-16 MED ORDER — SODIUM CHLORIDE 0.9 % IV SOLN
Freq: Once | INTRAVENOUS | Status: AC
  Administered 2016-08-16: 13:00:00 via INTRAVENOUS

## 2016-08-16 MED ORDER — INSULIN ASPART 100 UNIT/ML ~~LOC~~ SOLN
0.0000 [IU] | Freq: Three times a day (TID) | SUBCUTANEOUS | Status: DC
  Administered 2016-08-16: 11 [IU] via SUBCUTANEOUS
  Administered 2016-08-17: 8 [IU] via SUBCUTANEOUS
  Filled 2016-08-16: qty 11

## 2016-08-16 MED ORDER — INSULIN GLARGINE 100 UNIT/ML ~~LOC~~ SOLN
40.0000 [IU] | Freq: Every day | SUBCUTANEOUS | Status: DC
  Administered 2016-08-17: 40 [IU] via SUBCUTANEOUS
  Filled 2016-08-16: qty 0.4

## 2016-08-16 MED ORDER — INSULIN ASPART 100 UNIT/ML ~~LOC~~ SOLN
0.0000 [IU] | Freq: Every day | SUBCUTANEOUS | Status: DC
  Administered 2016-08-16: 3 [IU] via SUBCUTANEOUS
  Filled 2016-08-16: qty 3
  Filled 2016-08-16: qty 8

## 2016-08-16 NOTE — Care Management Important Message (Signed)
Important Message  Patient Details  [name] MRN: [id number] Date of Birth: 10/02/1927   Medicare Important Message Given:  Yes    Shelbie Ammons, RN 08/16/2016, 11:25 AM

## 2016-08-16 NOTE — Plan of Care (Signed)
Problem: Pain Managment: Goal: General experience of comfort will improve Outcome: Progressing Patient has had no complaints of pain. Encouraged to monitor and report pain and this nurse will continue to assess and reassess patient's pain levels.

## 2016-08-16 NOTE — Progress Notes (Signed)
Physical Therapy Treatment Patient Details [name] MRN: [id number] DOB: 10-01-1927 Today's Date: 08/16/2016    History of Present Illness Pt admitted for complaints of SOB symptoms and weight gain. Pt with history of diastolic CHF, HTN, DM, and CAD s/p CABG. Pt currently still complaining of SOB symptoms.    PT Comments    Pt is making good progress towards goals with increased ambulation distance performed this session. Pt able to perform laps in room with seated rest break as necessary secondary to SOB symptoms. Per patient, he has not received IV lasix yet today. Pt able to go to bathroom with safe technique, min assist needed for hygiene. Overall, improving towards baseline level. Pt very appreciative of therapy and wants to do as much as possible. Will continue to progress.   Follow Up Recommendations  Home health PT     Equipment Recommendations  None recommended by PT    Recommendations for Other Services       Precautions / Restrictions Precautions Precautions: Fall Restrictions Weight Bearing Restrictions: No    Mobility  Bed Mobility Overal bed mobility: Needs Assistance Bed Mobility: Supine to Sit;Sit to Supine       Sit to supine: Min guard   General bed mobility comments: Pt able to bring B LEs up to bed with safe technique as well as position self in bed. Pt refuses bed alarm to be activated  Transfers Overall transfer level: Needs assistance Equipment used: Rolling walker (2 wheeled) Transfers: Sit to/from Stand Sit to Stand: Min guard         General transfer comment: multiple stands from different height surfaces including toliet, bed, recliner. Safe technique with use of RW.   Ambulation/Gait Ambulation/Gait assistance: Min guard Ambulation Distance (Feet): 100 Feet Assistive device: Rolling walker (2 wheeled) Gait Pattern/deviations: Step-through pattern     General Gait Details: Pt ambulated laps in room with 3 seated rest  breaks required as pt needing to go to bathroom, didn't want to leave room. Reciprocal gait pattern noted. Pt aware when he became fatigued and requested seated rest break secondary to SOB symptoms. Decreased swelling noted on B feet.   Stairs            Wheelchair Mobility    Modified Rankin (Stroke Patients Only)       Balance                                    Cognition Arousal/Alertness: Awake/alert Behavior During Therapy: WFL for tasks assessed/performed Overall Cognitive Status: Within Functional Limits for tasks assessed                      Exercises Other Exercises Other Exercises: Pt ambulated to bathroom with cga. Pt able to attempt self hygiene, however did need min assist for finishing up cleaning. Pt then able to ambulate to sink for handwashing with supervision for balance.    General Comments        Pertinent Vitals/Pain Pain Assessment: No/denies pain    Home Living                      Prior Function            PT Goals (current goals can now be found in the care plan section) Acute Rehab PT Goals Patient Stated Goal: to become less SOB  PT Goal Formulation:  With patient Time For Goal Achievement: 08/29/16 Potential to Achieve Goals: Good Progress towards PT goals: Progressing toward goals    Frequency    Min 2X/week      PT Plan Current plan remains appropriate    Co-evaluation             End of Session Equipment Utilized During Treatment: Gait belt Activity Tolerance: Patient tolerated treatment well Patient left: in bed (refuses alarm) Nurse Communication: Mobility status PT Visit Diagnosis: Unsteadiness on feet (R26.81);Muscle weakness (generalized) (M62.81)     Time: 8677-3736 PT Time Calculation (min) (ACUTE ONLY): 39 min  Charges:  $Gait Training: 23-37 mins $Therapeutic Activity: 8-22 mins                    G Codes:       Sky Primo 08/21/16, 10:38 AM Greggory Stallion,  PT, DPT [phone]

## 2016-08-16 NOTE — Progress Notes (Signed)
Patient [name] Date of Encounter: 08/16/2016  Primary Cardiologist: [facility] Problem List     Active Problems:   CHF (congestive heart failure) (HCC)     Subjective   SOB about the same. HGB down to 8.6 today from 9.3 the prior day. Net -4.8 L for the admission and minus 2.4 L for the past 24 hours. Documented weight down 26 pounds, uncertain of accuracy of admission weight. He is requested another blood transfusion vs Procrit.   Inpatient Medications    Scheduled Meds: . aspirin EC  81 mg Oral Daily  . calcitRIOL  0.25 mcg Oral Once per day on Mon Wed Fri  . carvedilol  6.25 mg Oral BID  . cholecalciferol  1,000 Units Oral Daily  . enoxaparin (LOVENOX) injection  30 mg Subcutaneous Q24H  . escitalopram  10 mg Oral Daily  . ferrous sulfate  325 mg Oral Q breakfast  . furosemide  40 mg Intravenous BID  . hydroxychloroquine  200 mg Oral Daily  . insulin aspart  0-9 Units Subcutaneous TID WC  . insulin aspart  4 Units Subcutaneous TID WC  . insulin glargine  35 Units Subcutaneous Daily  . isosorbide mononitrate  30 mg Oral Daily  . multivitamin with minerals  1 tablet Oral Daily  . multivitamin-lutein  1 capsule Oral BID  . pantoprazole  40 mg Oral Daily  . senna  1 tablet Oral BID  . sodium chloride flush  3 mL Intravenous Q12H  . tamsulosin  0.4 mg Oral Daily  . vitamin C  500 mg Oral Daily   Continuous Infusions:  PRN Meds: acetaminophen **OR** acetaminophen, ALPRAZolam, bisacodyl, guaiFENesin-dextromethorphan, ketotifen, loratadine, ondansetron **OR** ondansetron (ZOFRAN) IV, phenol-menthol, polyethylene glycol, sodium chloride flush, traMADol   Vital Signs    Vitals:   08/15/16 1137 08/15/16 1945 08/16/16 0323 08/16/16 0358  BP: (!) 141/61 (!) 109/50 139/67   Pulse: 63 63 66   Resp: 14 16 16    Temp:  97.7 F (36.5 C) 97.8 F (36.6 C)   TempSrc:  Oral    SpO2: 100% 95% 94%   Weight:    238 lb 12.8 oz (108.3 kg)  Height:         Intake/Output Summary (Last 24 hours) at 08/16/16 0752 Last data filed at 08/16/16 0327  Gross per 24 hour  Intake              360 ml  Output             2840 ml  Net            -2480 ml   Filed Weights   08/14/16 1438 08/14/16 1928 08/16/16 0358  Weight: 254 lb (115.2 kg) 243 lb 12.8 oz (110.6 kg) 238 lb 12.8 oz (108.3 kg)    Physical Exam    GEN: Well nourished, well developed, in no acute distress.  HEENT: Grossly normal.  Neck: Supple, JVD elevated ~ 8 cm, no carotid bruits, or masses. Cardiac: Irregularly irregular, II/VI systolic murmur at LLSB, no rubs, or gallops. No clubbing, cyanosis, 1+ LE pitting edema.  Radials/DP/PT 2+ and equal bilaterally.  Respiratory:  Diminished breath sounds bilaterally with faint expiratory wheezing. GI: Soft, nontender, nondistended, BS + x 4. MS: no deformity or atrophy. Skin: warm and dry, no rash. Neuro:  Strength and sensation are intact. Psych: AAOx3.  Normal affect.  Labs    CBC  Recent Labs  08/14/16 1058 08/14/16 1437 08/15/16  0419 08/16/16 0247  WBC 6.5 6.8  --  6.0  NEUTROABS 4.4  --   --   --   HGB 7.7 Repeated and verified X2.* 8.1* 9.3* 8.6*  HCT 23.6 Repeated and verified X2.* 25.0*  --  26.2*  MCV 83.4 83.5  --  83.0  PLT 211.0 212  --  299   Basic Metabolic Panel  Recent Labs  08/14/16 1437 08/16/16 0247  NA 130* 131*  K 4.4 4.6  CL 93* 94*  CO2 27 29  GLUCOSE 300* 298*  BUN 62* 58*  CREATININE 2.49* 2.25*  CALCIUM 8.8* 8.7*  MG 2.3  --    Liver Function Tests No results for input(s): AST, ALT, ALKPHOS, BILITOT, PROT, ALBUMIN in the last 72 hours. No results for input(s): LIPASE, AMYLASE in the last 72 hours. Cardiac Enzymes  Recent Labs  08/14/16 1437  TROPONINI <0.03   BNP Invalid input(s): POCBNP D-Dimer No results for input(s): DDIMER in the last 72 hours. Hemoglobin A1C  Recent Labs  08/15/16 0419  HGBA1C 6.4*   Fasting Lipid Panel No results for input(s): CHOL, HDL,  LDLCALC, TRIG, CHOLHDL, LDLDIRECT in the last 72 hours. Thyroid Function Tests No results for input(s): TSH, T4TOTAL, T3FREE, THYROIDAB in the last 72 hours.  Invalid input(s): FREET3  Telemetry    Afib, 70's bpm, frequent PVCs - Personally Reviewed  ECG    n/a - Personally Reviewed  Radiology    Dg Chest 2 View  Result Date: 08/14/2016 CLINICAL DATA:  83 year old male with increased shortness of breath. Fluid retention. Initial encounter. EXAM: CHEST  2 VIEW COMPARISON:  07/16/2016 and earlier. FINDINGS: Seated AP and lateral views of the chest. Small right pleural effusion appears stable to mildly increased. Stable cardiomegaly and mediastinal contours. Calcified aortic atherosclerosis. Visualized tracheal air column is within normal limits. Acute on chronic increased pulmonary vascularity. No pneumothorax. No consolidation. Prosthetic cardiac valve. No acute osseous abnormality identified. Negative visible bowel gas pattern. IMPRESSION: Stable to mild progression since February of small right pleural effusion and vascular congestion/interstitial edema. Electronically Signed   By: Genevie Ann M.D.   On: 08/14/2016 15:07    Cardiac Studies   TTE 03/2016: Study Conclusions  - Left ventricle: The cavity size was normal. There was mild concentric hypertrophy. Systolic function was normal. The estimated ejection fraction was in the range of 55% to 60%. Wall motion was normal; there were no regional wall motion abnormalities. The study is not technically sufficient to allow evaluation of LV diastolic function. - Aortic valve: A bioprosthesis was present. Transvalvular velocity was within the normal range for prosthetic valve Peak velocity (S): 241 cm/s. Mean gradient (S): 14 mm Hg. Peak gradient (S): 23 mm Hg. - Left atrium: The atrium was mild to moderately dilated. - Right ventricle: Systolic function was normal. - Pulmonary arteries: Systolic pressure could not be  accurately estimated. - Inferior vena cava: The vessel was dilated. The respirophasic diameter changes were blunted (<50%), consistent with elevated central venous pressure.  Impressions:  - Rhythm is atrial fibrillation.  Recommendations: Challenging image quality.  Patient Profile     83 y.o. male with history of CAD s/p CABG x 1 in 07/2007, chronic Afib s/p Watchman device in 08/2015 previously on Eliquis with GI bleed noted in the setting of AV malformations, severe aortic stenosis s/p bioprosthetic AVR, chronic diastolic CHF, stroke, and bladder cancer,who presented to [facility] With progressive leg swelling and shortness of breath.  Assessment & Plan  1. Acute respiratory distress: -Likely multifactorial including volume overload with acute on chronic diastolic CHF and symptomatic anemia -Breathing about the same today compared to 3/8  2. Acute on chronic diastolic CHF: -Breathing as above -Continue IV Lasix 40 mg bid, renal function continues to improve with diuresis and prior blood transfusion -Continue Coreg 6.25 mg bid  3. Anemia: -Status post 1 unit pRBC in the ED at time of admission -HGB down to 8.6 today from 9.3 the prior day -He is requesting 1 unit pRBC vs Procrit -He will likely feel better with a hgb in the high 9 range -Defer possible transfusion to IM -If transfusion is done today, will need to transfuse slowly and continue IV Lasix -ASA only  4. Permanent Afib: -Rate controlled -Status post Watchman for stroke PPX -Not on full-dose anticoagulation 2/2 GI bleed -Continue ASA  5. CAD: -No symptoms concerning for angina -Continue SA, Coreg, and Imdur -Consider statin as an outpatient -No plans for inpatient ischemic evaluation  6. S/p bioprosthetic AVR: -Outpatient follow up  7. CKD stage IV: -Bmet with improving renal function as above   Signed, Christell Faith, PA-C [facility] Pager: [phone] 08/16/2016, 7:52 AM

## 2016-08-16 NOTE — Progress Notes (Signed)
Inpatient Diabetes Program Recommendations  AACE/ADA: New Consensus Statement on Inpatient Glycemic Control (2015)  Target Ranges:  Prepandial:   less than 140 mg/dL      Peak postprandial:   less than 180 mg/dL (1-2 hours)      Critically ill patients:  140 - 180 mg/dL   Results for ISIDOR, BROMELL (MRN [id number]) as of 08/16/2016 09:20  Ref. Range 08/15/2016 07:52 08/15/2016 11:37 08/15/2016 16:52 08/15/2016 21:38  Glucose-Capillary Latest Ref Range: 65 - 99 mg/dL 363 (H) 338 (H) 177 (H) 205 (H)   Results for VASCO, CHONG (MRN [id number]) as of 08/16/2016 09:20  Ref. Range 08/16/2016 07:54  Glucose-Capillary Latest Ref Range: 65 - 99 mg/dL 360 (H)    Home DM Meds: Lantus 35 units daily        Novolog 3 units TID  Current Insulin Orders: Lantus 35 daily       Novolog Sensitive Correction Scale/ SSI (0-9 units) TID AC      Novolog 4 units TID with meals      MD- Please consider the following in-hospital insulin adjustments:  1. Increase Lantus to 40 units daily  2. Increase Novolog Correction Scale/ SSI to Moderate scale (0-15 units) TID AC + HS     --Will follow patient during hospitalization--  Wyn Quaker RN, MSN, CDE Diabetes Coordinator Inpatient Glycemic Control Team Team Pager: [phone] (8a-5p)

## 2016-08-16 NOTE — Progress Notes (Signed)
[place] [facility] [place] [name]    MR#:  [id number]  DATE OF BIRTH:  [birth year]  SUBJECTIVE:  CHIEF COMPLAINT:   Chief Complaint  Patient presents with  . Shortness of Breath   - hb dropping again, no active bleeding seen - feels better, still volume overloaded  REVIEW OF SYSTEMS:  Review of Systems  Constitutional: Negative for chills, fever and malaise/fatigue.  HENT: Positive for hearing loss. Negative for congestion, ear discharge and nosebleeds.   Eyes: Negative for blurred vision and double vision.  Respiratory: Positive for cough and shortness of breath. Negative for wheezing.   Cardiovascular: Positive for leg swelling. Negative for chest pain and palpitations.  Gastrointestinal: Negative for abdominal pain, constipation, diarrhea, nausea and vomiting.  Genitourinary: Negative for dysuria.  Musculoskeletal: Negative for myalgias and neck pain.  Neurological: Negative for dizziness, sensory change, speech change, focal weakness, seizures and headaches.  Psychiatric/Behavioral: Negative for depression.    DRUG ALLERGIES:   Allergies  Allergen Reactions  . Asa [Aspirin] Other (See Comments)    Reaction:  Caused stomach ulcer patient can take 81 mg.  . Losartan Other (See Comments)    Decreased pulse rate  . Metoprolol Other (See Comments)    Reaction:  Bradycardia   . Penicillins Swelling and Other (See Comments)    Has patient had a PCN reaction causing immediate rash, facial/tongue/throat swelling, SOB or lightheadedness with hypotension: No Has patient had a PCN reaction causing severe rash involving mucus membranes or skin necrosis: No Has patient had a PCN reaction that required hospitalization No Has patient had a PCN reaction occurring within the last 10 years: No If all of the above answers are "NO", then may proceed with Cephalosporin use.    VITALS:  Blood pressure (!) 150/50, pulse 73, temperature  98.2 F (36.8 C), temperature source Oral, resp. rate 16, height 5\' 10"  (1.778 m), weight 108.3 kg (238 lb 12.8 oz), SpO2 99 %.  PHYSICAL EXAMINATION:  Physical Exam  GENERAL:  83 y.o.-year-old patient sitting in the bed with no acute distress.  EYES: Pupils equal, round, reactive to light and accommodation. No scleral icterus. Extraocular muscles intact.  HEENT: Head atraumatic, normocephalic. Oropharynx and nasopharynx clear.  NECK:  Supple, no jugular venous distention. No thyroid enlargement, no tenderness.  LUNGS: Normal breath sounds bilaterally, no wheezing, rhonchi or crepitation. No use of accessory muscles of respiration. Fine bibasilar crackles heard CARDIOVASCULAR: S1, S2 normal. No rubs, or gallops. 3/6 systolic murmur present ABDOMEN: Soft, nontender, nondistended. Bowel sounds present. No organomegaly or mass.  EXTREMITIES: No cyanosis, or clubbing. 2+ pedal edema present. NEUROLOGIC: Cranial nerves II through XII are intact. Muscle strength 5/5 in all extremities. Sensation intact. Gait not checked.  PSYCHIATRIC: The patient is alert and oriented x 3.  SKIN: No obvious rash, lesion, or ulcer.    LABORATORY PANEL:   CBC  Recent Labs Lab 08/16/16 0247  WBC 6.0  HGB 8.6*  HCT 26.2*  PLT 217   ------------------------------------------------------------------------------------------------------------------  Chemistries   Recent Labs Lab 08/14/16 1437 08/16/16 0247  NA 130* 131*  K 4.4 4.6  CL 93* 94*  CO2 27 29  GLUCOSE 300* 298*  BUN 62* 58*  CREATININE 2.49* 2.25*  CALCIUM 8.8* 8.7*  MG 2.3  --    ------------------------------------------------------------------------------------------------------------------  Cardiac Enzymes  Recent Labs Lab 08/14/16 1437  TROPONINI <0.03   ------------------------------------------------------------------------------------------------------------------  RADIOLOGY:  Dg Chest 2 View  Result Date:  08/16/2016 CLINICAL DATA:  Severe shortness of breath. Congestive heart failure. EXAM: CHEST  2 VIEW COMPARISON:  08/14/2016 FINDINGS: Stable mild cardiomegaly. Previous aortic valve replacement. Decreased interstitial prominence, consistent with decreased mild interstitial edema. No evidence of airspace disease. Tiny right pleural effusion is decreased in size since previous study. Aortic atherosclerosis. IMPRESSION: Decreased mild interstitial edema and tiny right pleural effusion. Stable cardiomegaly. Electronically Signed   By: Earle Gell M.D.   On: 08/16/2016 09:17   Dg Chest 2 View  Result Date: 08/14/2016 CLINICAL DATA:  83 year old male with increased shortness of breath. Fluid retention. Initial encounter. EXAM: CHEST  2 VIEW COMPARISON:  07/16/2016 and earlier. FINDINGS: Seated AP and lateral views of the chest. Small right pleural effusion appears stable to mildly increased. Stable cardiomegaly and mediastinal contours. Calcified aortic atherosclerosis. Visualized tracheal air column is within normal limits. Acute on chronic increased pulmonary vascularity. No pneumothorax. No consolidation. Prosthetic cardiac valve. No acute osseous abnormality identified. Negative visible bowel gas pattern. IMPRESSION: Stable to mild progression since February of small right pleural effusion and vascular congestion/interstitial edema. Electronically Signed   By: Genevie Ann M.D.   On: 08/14/2016 15:07    EKG:   Orders placed or performed during the hospital encounter of 08/14/16  . ED EKG within 10 minutes  . ED EKG within 10 minutes    ASSESSMENT AND PLAN:   83 year old male with past medical history significant for diastolic congestive heart failure, CAD status post CABG, aortic valve replacement surgery with bioprosthetic valve, hypertension, hyperlipidemia, atrial fibrillation presents to the hospital secondary to worsening shortness of breath and pedal edema. Also noted to be anemic.  #1 acute on  chronic diastolic CHF exacerbation-continue IV diuresis, on lasix 40mg  IV BID- continue for 1 more day - monitor renal function closely, strict input and output monitoring, net negative by 4.5L since admission - off oxygen. -Appreciate cardiology consult. No plans to repeat echocardiogram - recent ECHO with EF 55%  #2 anemia- acute on chronic. No active bleeding noted. Previously was on eliquis which was discontinued. -1 unit packed RBC given on adm and hb down to <9 again. - will order 1 more unit today to keep hemoglobin greater than 9. Monitor closely. Continue low-dose aspirin -On iron supplements  #3 atrial fibrillation-permanent A. fib. Rate is controlled. Not on anticoagulation due to GI bleed. Continue low-dose aspirin. -Continue carvedilol  #4 diabetes mellitus-on Lantus and sliding scale insulin. Sugars are elevated, increase Lantus. Appreciate diabetes coordinator's input  #5 CAD-stable at this time. Continue Imdur, aspirin, Coreg  #6 DVT prophylaxis-on Lovenox   Physical therapy consulted Anticipate discharge in 1-2 days    All the records are reviewed and case discussed with Care Management/Social Workerr. Management plans discussed with the patient, family and they are in agreement.  CODE STATUS: Full code  TOTAL TIME TAKING CARE OF THIS PATIENT: 37 minutes.   POSSIBLE D/C IN 1-2 DAYS, DEPENDING ON CLINICAL CONDITION.   Gladstone Lighter M.D on 08/16/2016 at 10:14 AM  Between 7am to 6pm - Pager - [phone]  After 6pm go to [email or web address] - password [email or web address] [facility]  Office  [phone]  CC: Primary care physician; [name], MD

## 2016-08-16 NOTE — Progress Notes (Signed)
Patient is complaining of constipation. Patient has received a laxative and stool softener. Patient has also requested prune juice. Will continue to monitor and assess for any progress.

## 2016-08-16 NOTE — Progress Notes (Signed)
Patient's FSBS 89. Patient reports feeling "funny" and requesting some candy. Provided patient with OJ and peanut butter crackers. Currently sitting on the edge of the bed with no complaints. Will continue to monitor.

## 2016-08-17 LAB — TYPE AND SCREEN
ABO/RH(D): O POS
Antibody Screen: NEGATIVE
UNIT DIVISION: 0
Unit division: 0

## 2016-08-17 LAB — CBC
HEMATOCRIT: 27.5 % — AB (ref 40.0–52.0)
HEMOGLOBIN: 9.1 g/dL — AB (ref 13.0–18.0)
MCH: 27.6 pg (ref 26.0–34.0)
MCHC: 33.2 g/dL (ref 32.0–36.0)
MCV: 82.9 fL (ref 80.0–100.0)
Platelets: 204 10*3/uL (ref 150–440)
RBC: 3.31 MIL/uL — AB (ref 4.40–5.90)
RDW: 16.3 % — ABNORMAL HIGH (ref 11.5–14.5)
WBC: 5.7 10*3/uL (ref 3.8–10.6)

## 2016-08-17 LAB — BPAM RBC
BLOOD PRODUCT EXPIRATION DATE: 201803132359
Blood Product Expiration Date: 201803292359
ISSUE DATE / TIME: 201803091303
ISSUE DATE / TIME: 201803072307
UNIT TYPE AND RH: 9500
Unit Type and Rh: 5100

## 2016-08-17 LAB — GLUCOSE, CAPILLARY
GLUCOSE-CAPILLARY: 292 mg/dL — AB (ref 65–99)
Glucose-Capillary: 232 mg/dL — ABNORMAL HIGH (ref 65–99)

## 2016-08-17 LAB — BASIC METABOLIC PANEL
ANION GAP: 11 (ref 5–15)
BUN: 60 mg/dL — ABNORMAL HIGH (ref 6–20)
CHLORIDE: 93 mmol/L — AB (ref 101–111)
CO2: 28 mmol/L (ref 22–32)
CREATININE: 2.15 mg/dL — AB (ref 0.61–1.24)
Calcium: 8.7 mg/dL — ABNORMAL LOW (ref 8.9–10.3)
GFR calc non Af Amer: 26 mL/min — ABNORMAL LOW (ref >60)
GFR, EST AFRICAN AMERICAN: 30 mL/min — AB (ref >60)
Glucose, Bld: 297 mg/dL — ABNORMAL HIGH (ref 65–99)
POTASSIUM: 4.4 mmol/L (ref 3.5–5.1)
Sodium: 132 mmol/L — ABNORMAL LOW (ref 135–145)

## 2016-08-17 MED ORDER — FERROUS SULFATE 325 (65 FE) MG PO TABS: 325.0000 mg | ORAL_TABLET | Freq: Two times a day (BID) | ORAL | 3 refills | Status: AC

## 2016-08-17 MED ORDER — TORSEMIDE 20 MG PO TABS: 40.0000 mg | ORAL_TABLET | Freq: Two times a day (BID) | ORAL | 2 refills | Status: AC

## 2016-08-17 MED ORDER — FUROSEMIDE 40 MG PO TABS
40.0000 mg | ORAL_TABLET | Freq: Two times a day (BID) | ORAL | Status: DC
  Administered 2016-08-17: 40 mg via ORAL
  Filled 2016-08-17: qty 1

## 2016-08-17 NOTE — Discharge Summary (Signed)
[facility] [name]    MR#:  [id number]  DATE OF BIRTH:  Oct 13, 1927  DATE OF ADMISSION:  08/14/2016   ADMITTING PHYSICIAN: Fritzi Mandes, MD  DATE OF DISCHARGE: 08/17/2016 11:14 AM  PRIMARY CARE PHYSICIAN: [name], MD   ADMISSION DIAGNOSIS:   Acute on chronic congestive heart failure, unspecified congestive heart failure type ([place]) [I50.9]  DISCHARGE DIAGNOSIS:   Active Problems:   CHF (congestive heart failure) ([place])   SECONDARY DIAGNOSIS:   Past Medical History:  Diagnosis Date  . Anemia   . Aortic stenosis, severe    a. s/p bioprosthetic aortic valve replacement in 2009  . BPH (benign prostatic hypertrophy)   . CAD (coronary artery disease)    a. s/p CABG x 1 in 2009  . Chronic diastolic (congestive) heart failure   . CVA (cerebral infarction) 06/2014   Right MCA infarct  . Diabetes mellitus   . Gastrointestinal bleed    a. reccurent GIB  . History of bladder cancer   . Hyperlipidemia   . Hypertension   . Junctional bradycardia   . Macular degeneration   . Mobitz type 1 second degree atrioventricular block 10/01/2012  . OA (osteoarthritis)   . Permanent atrial fibrillation (H[facility])    a. s/p Watchman device 08/10/2015; b. on Eliquis    HOSPITAL COURSE:   83 year old male with past medical history significant for diastolic congestive heart failure, CAD status post CABG, aortic valve replacement surgery with bioprosthetic valve, hypertension, hyperlipidemia, atrial fibrillation presents to the hospital secondary to worsening shortness of breath and pedal edema. Also noted to be anemic.  #1 acute on chronic diastolic CHF exacerbation-received IV diuresis, on lasix 40mg  IV BID- changed to 40mg  BID torsemide home dose at discharge -net negative by 5.1 L since admission - off oxygen. -Appreciate cardiology consult. No plans to repeat echocardiogram - recent ECHO with EF 55%  #2 anemia- acute on  chronic. No active bleeding noted. Previously was on eliquis which was discontinued. -2 unit packed RBCs given this admission. -Continue low-dose aspirin -On iron supplements - outpatient hematology f/u for aranesp or procrit injections recommended  #3 atrial fibrillation-permanent A. fib. Rate is controlled. Not on anticoagulation due to GI bleed. Continue low-dose aspirin. -Continue carvedilol - Appreciate cardiology consult  #4 diabetes mellitus-on Lantus and NovoLog  #5 CAD-stable at this time. Continue Imdur, aspirin, Coreg  Patient feels stable and is ready for discharge today.  DISCHARGE CONDITIONS:   Guarded  CONSULTS OBTAINED:   Treatment Team:  Nelva Bush, MD Wellington Hampshire, MD  DRUG ALLERGIES:   Allergies  Allergen Reactions  . Asa [Aspirin] Other (See Comments)    Reaction:  Caused stomach ulcer patient can take 81 mg.  . Losartan Other (See Comments)    Decreased pulse rate  . Metoprolol Other (See Comments)    Reaction:  Bradycardia   . Penicillins Swelling and Other (See Comments)    Has patient had a PCN reaction causing immediate rash, facial/tongue/throat swelling, SOB or lightheadedness with hypotension: No Has patient had a PCN reaction causing severe rash involving mucus membranes or skin necrosis: No Has patient had a PCN reaction that required hospitalization No Has patient had a PCN reaction occurring within the last 10 years: No If all of the above answers are "NO", then may proceed with Cephalosporin use.   DISCHARGE MEDICATIONS:   Allergies as of 08/17/2016      Reactions  Asa [aspirin] Other (See Comments)   Reaction:  Caused stomach ulcer patient can take 81 mg.   Losartan Other (See Comments)   Decreased pulse rate   Metoprolol Other (See Comments)   Reaction:  Bradycardia    Penicillins Swelling, Other (See Comments)   Has patient had a PCN reaction causing immediate rash, facial/tongue/throat swelling, SOB or  lightheadedness with hypotension: No Has patient had a PCN reaction causing severe rash involving mucus membranes or skin necrosis: No Has patient had a PCN reaction that required hospitalization No Has patient had a PCN reaction occurring within the last 10 years: No If all of the above answers are "NO", then may proceed with Cephalosporin use.      Medication List    TAKE these medications   acetaminophen 325 MG tablet Commonly known as:  TYLENOL Take 2 tablets (650 mg total) by mouth every 6 (six) hours as needed for mild pain (or Fever >/= 101).   ALPRAZolam 0.25 MG tablet Commonly known as:  XANAX Take 1 tablet (0.25 mg total) by mouth at bedtime as needed for anxiety.   aspirin EC 81 MG tablet Take 1 tablet (81 mg total) by mouth daily.   azelastine 0.05 % ophthalmic solution Commonly known as:  OPTIVAR Place 1 drop into the right eye 2 (two) times daily.   calcitRIOL 0.25 MCG capsule Commonly known as:  ROCALTROL TAKE ONE CAPSULE THREE TIMES A WEEK (MONDAY, WEDNESDAY, AND FRIDAY)   carvedilol 6.25 MG tablet Commonly known as:  COREG Take 1 tablet (6.25 mg total) by mouth 2 (two) times daily.   cholecalciferol 1000 units tablet Commonly known as:  VITAMIN D Take 1,000 Units by mouth daily.   escitalopram 10 MG tablet Commonly known as:  LEXAPRO TAKE 1 TABLET EVERY DAY WITH DINNER   ferrous sulfate 325 (65 FE) MG tablet Take 1 tablet (325 mg total) by mouth 2 (two) times daily with a meal. What changed:  when to take this   HYDROcodone-acetaminophen 5-325 MG tablet Commonly known as:  NORCO/VICODIN Take 1 tablet by mouth daily as needed for moderate pain. DO NOT EXCEED 4GM OF APAP IN 24 HOURS FROM ALL SOURCES   hydroxychloroquine 200 MG tablet Commonly known as:  PLAQUENIL Take 200 mg by mouth daily.   insulin aspart 100 UNIT/ML injection Commonly known as:  novoLOG Inject 5-7 Units into the skin See admin instructions. Inject  3 units sq 3 times daily with  meals   insulin glargine 100 UNIT/ML injection Commonly known as:  LANTUS Inject 0.35 mLs (35 Units total) into the skin at bedtime. What changed:  when to take this   isosorbide mononitrate 30 MG 24 hr tablet Commonly known as:  IMDUR Take 30 mg by mouth daily.   loratadine 10 MG tablet Commonly known as:  CLARITIN Take 10 mg by mouth daily.   multivitamin with minerals Tabs tablet Take 1 tablet by mouth daily.   pantoprazole 40 MG tablet Commonly known as:  PROTONIX Take 1 tablet (40 mg total) by mouth daily.   PRESERVISION AREDS 2 Caps Take 1 capsule by mouth 2 (two) times daily.   tamsulosin 0.4 MG Caps capsule Commonly known as:  FLOMAX TAKE 1 CAPSULE BY MOUTH DAILY AFTER SUPPER   torsemide 20 MG tablet Commonly known as:  DEMADEX Take 2 tablets (40 mg total) by mouth 2 (two) times daily. What changed:  when to take this   vitamin C 500 MG tablet Commonly known as:  ASCORBIC ACID Take 500 mg by mouth daily.        DISCHARGE INSTRUCTIONS:   1. PCP f/u in 1-2 weeks 2. Cardiology f/u in 2 weeks 3. Hematology follow up in 1 week for procrit  DIET:   Cardiac diet  ACTIVITY:   Activity as tolerated  OXYGEN:   Home Oxygen: No.  Oxygen Delivery: room air  DISCHARGE LOCATION:   home   If you experience worsening of your admission symptoms, develop shortness of breath, life threatening emergency, suicidal or homicidal thoughts you must seek medical attention immediately by calling 911 or calling your MD immediately  if symptoms less severe.  You Must read complete instructions/literature along with all the possible adverse reactions/side effects for all the Medicines you take and that have been prescribed to you. Take any new Medicines after you have completely understood and accpet all the possible adverse reactions/side effects.   Please note  You were cared for by a hospitalist during your hospital stay. If you have any questions about your  discharge medications or the care you received while you were in the hospital after you are discharged, you can call the unit and asked to speak with the hospitalist on call if the hospitalist that took care of you is not available. Once you are discharged, your primary care physician will handle any further medical issues. Please note that NO REFILLS for any discharge medications will be authorized once you are discharged, as it is imperative that you return to your primary care physician (or establish a relationship with a primary care physician if you do not have one) for your aftercare needs so that they can reassess your need for medications and monitor your lab values.    On the day of Discharge:  VITAL SIGNS:   Blood pressure 140/60, pulse 81, temperature 98 F (36.7 C), temperature source Oral, resp. rate 16, height 5\' 10"  (1.778 m), weight 108.3 kg (238 lb 12.8 oz), SpO2 96 %.  PHYSICAL EXAMINATION:    GENERAL:  83 y.o.-year-old patient sitting in the bed with no acute distress.  EYES: Pupils equal, round, reactive to light and accommodation. No scleral icterus. Extraocular muscles intact.  HEENT: Head atraumatic, normocephalic. Oropharynx and nasopharynx clear.  NECK:  Supple, no jugular venous distention. No thyroid enlargement, no tenderness.  LUNGS: Normal breath sounds bilaterally, no wheezing, rhonchi or crepitation. No use of accessory muscles of respiration. CARDIOVASCULAR: S1, S2 normal. No rubs, or gallops. 3/6 systolic murmur present ABDOMEN: Soft, nontender, nondistended. Bowel sounds present. No organomegaly or mass.  EXTREMITIES: No cyanosis, or clubbing. 2+ pedal edema present. NEUROLOGIC: Cranial nerves II through XII are intact. Muscle strength 5/5 in all extremities. Sensation intact. Gait not checked.  PSYCHIATRIC: The patient is alert and oriented x 3.  SKIN: No obvious rash, lesion, or ulcer.   DATA REVIEW:   CBC  Recent Labs Lab 08/17/16 0504  WBC 5.7    HGB 9.1*  HCT 27.5*  PLT 204    Chemistries   Recent Labs Lab 08/14/16 1437  08/17/16 0504  NA 130*  < > 132*  K 4.4  < > 4.4  CL 93*  < > 93*  CO2 27  < > 28  GLUCOSE 300*  < > 297*  BUN 62*  < > 60*  CREATININE 2.49*  < > 2.15*  CALCIUM 8.8*  < > 8.7*  MG 2.3  --   --   < > = values in this interval not  displayed.   Microbiology Results  Results for orders placed or performed during the hospital encounter of 06/19/16  Gastrointestinal Panel by PCR , Stool     Status: Abnormal   Collection Time: 06/19/16  5:42 PM  Result Value Ref Range Status   Campylobacter species NOT DETECTED NOT DETECTED Final   Plesimonas shigelloides NOT DETECTED NOT DETECTED Final   Salmonella species NOT DETECTED NOT DETECTED Final   Yersinia enterocolitica NOT DETECTED NOT DETECTED Final   Vibrio species NOT DETECTED NOT DETECTED Final   Vibrio cholerae NOT DETECTED NOT DETECTED Final   Enteroaggregative E coli (EAEC) NOT DETECTED NOT DETECTED Final   Enteropathogenic E coli (EPEC) NOT DETECTED NOT DETECTED Final   Enterotoxigenic E coli (ETEC) NOT DETECTED NOT DETECTED Final   Shiga like toxin producing E coli (STEC) NOT DETECTED NOT DETECTED Final   Shigella/Enteroinvasive E coli (EIEC) NOT DETECTED NOT DETECTED Final   Cryptosporidium NOT DETECTED NOT DETECTED Final   Cyclospora cayetanensis NOT DETECTED NOT DETECTED Final   Entamoeba histolytica NOT DETECTED NOT DETECTED Final   Giardia lamblia NOT DETECTED NOT DETECTED Final   Adenovirus F40/41 NOT DETECTED NOT DETECTED Final   Astrovirus NOT DETECTED NOT DETECTED Final   Norovirus GI/GII DETECTED (A) NOT DETECTED Final    Comment: CRITICAL RESULT CALLED TO, READ BACK BY AND VERIFIED WITH: STEVEN JONES 06/19/16 1952 KLW    Rotavirus A NOT DETECTED NOT DETECTED Final   Sapovirus (I, II, IV, and V) NOT DETECTED NOT DETECTED Final  C difficile quick scan w PCR reflex     Status: None   Collection Time: 06/19/16  5:42 PM  Result Value  Ref Range Status   C Diff antigen NEGATIVE NEGATIVE Final   C Diff toxin NEGATIVE NEGATIVE Final   C Diff interpretation No C. difficile detected.  Final  Rapid Influenza A&B Antigens ([facility] only)     Status: None   Collection Time: 06/19/16  6:56 PM  Result Value Ref Range Status   Influenza A ([facility]) NEGATIVE NEGATIVE Final   Influenza B ([facility]) NEGATIVE NEGATIVE Final  MRSA PCR Screening     Status: None   Collection Time: 06/20/16  2:31 AM  Result Value Ref Range Status   MRSA by PCR NEGATIVE NEGATIVE Final    Comment:        The GeneXpert MRSA Assay (FDA approved for NASAL specimens only), is one component of a comprehensive MRSA colonization surveillance program. It is not intended to diagnose MRSA infection nor to guide or monitor treatment for MRSA infections.     RADIOLOGY:  No results found.   Management plans discussed with the patient, family and they are in agreement.  CODE STATUS:     Code Status Orders        Start     Ordered   08/14/16 1923  Full code  Continuous     08/14/16 1922    Code Status History    Date Active Date Inactive Code Status Order ID Comments User Context   07/16/2016 11:39 PM 07/19/2016  3:53 PM Full Code [id number]  Harvie Bridge, DO Inpatient   06/19/2016  7:35 PM 06/21/2016  7:35 PM Partial Code [id number]  Theodoro Grist, MD ED   06/01/2016  6:21 PM 06/04/2016  6:52 PM Full Code [id number]  Shon Millet, DO Inpatient   05/08/2016  8:27 AM 05/16/2016  4:41 PM DNR [id number]  Everrett Coombe, MD Inpatient   05/02/2016  2:18 PM 05/08/2016  8:27  AM Full Code [id number]  Steve Rattler, DO Inpatient   05/02/2016  1:22 PM 05/02/2016  2:18 PM Partial Code [id number]  Elberta Leatherwood, MD ED   05/02/2016  1:12 PM 05/02/2016  1:22 PM DNR [id number]  Merrily Pew, MD ED   03/22/2016  3:48 PM 03/24/2016  4:45 PM Full Code [id number]  Loletha Grayer, MD ED   09/19/2015  7:27 PM 09/21/2015  5:47 PM Full Code [id number]  Lytle Butte, MD ED    07/28/2015  6:39 PM 07/31/2015  4:57 PM Full Code [id number]  [name] Costa, MD ED   07/10/2015 11:37 PM 07/12/2015 11:15 PM Full Code [id number]  Rise Patience, MD Inpatient   04/12/2015  7:05 PM 04/17/2015  7:10 PM Full Code [id number]  Geradine Girt, DO Inpatient   06/29/2014 12:24 PM 07/09/2014  2:15 PM Full Code [id number]  Bary Leriche, PA-C Inpatient    Advance Directive Documentation   Flowsheet Row Most Recent Value  Type of Advance Directive  Living will, Healthcare Power of Attorney  Pre-existing out of facility DNR order (yellow form or pink MOST form)  No data  "MOST" Form in Place?  No data      TOTAL TIME TAKING CARE OF THIS PATIENT: 38 minutes.    Shlonda Dolloff M.D on 08/17/2016 at 1:28 PM  Between 7am to 6pm - Pager - [phone]  After 6pm go to [email or web address] - Proofreader  [facility]  Office  [phone]  CC: Primary care physician; [name], MD   Note: This dictation was prepared with Dragon dictation along with smaller phrase technology. Any transcriptional errors that result from this process are unintentional.

## 2016-08-17 NOTE — Progress Notes (Signed)
Progress Note  Patient [name] Date of Encounter: 08/17/2016  Primary Cardiologist: Dr. Rockey Situ  Subjective   Transfused a second unit yesterday.   Feels better.  Going home today.  No pain.  Much less swelling  Inpatient Medications    Scheduled Meds: . aspirin EC  81 mg Oral Daily  . calcitRIOL  0.25 mcg Oral Once per day on Mon Wed Fri  . carvedilol  6.25 mg Oral BID  . cholecalciferol  1,000 Units Oral Daily  . enoxaparin (LOVENOX) injection  30 mg Subcutaneous Q24H  . escitalopram  10 mg Oral Daily  . ferrous sulfate  325 mg Oral Q breakfast  . furosemide  40 mg Oral BID  . hydroxychloroquine  200 mg Oral Daily  . insulin aspart  0-15 Units Subcutaneous TID WC  . insulin aspart  0-5 Units Subcutaneous QHS  . insulin aspart  4 Units Subcutaneous TID WC  . insulin glargine  40 Units Subcutaneous Daily  . isosorbide mononitrate  30 mg Oral Daily  . multivitamin with minerals  1 tablet Oral Daily  . multivitamin-lutein  1 capsule Oral BID  . pantoprazole  40 mg Oral Daily  . senna  1 tablet Oral BID  . sodium chloride flush  3 mL Intravenous Q12H  . tamsulosin  0.4 mg Oral Daily  . vitamin C  500 mg Oral Daily   Continuous Infusions:  PRN Meds: acetaminophen **OR** acetaminophen, ALPRAZolam, bisacodyl, guaiFENesin-dextromethorphan, ketotifen, loratadine, ondansetron **OR** ondansetron (ZOFRAN) IV, phenol-menthol, polyethylene glycol, sodium chloride flush, traMADol   Vital Signs    Vitals:   08/16/16 1334 08/16/16 1937 08/17/16 0344 08/17/16 0750  BP: (!) 120/58 (!) 120/51 (!) 155/65 140/60  Pulse: 70 69 69 81  Resp:  (!) 24 16 16   Temp: 97.7 F (36.5 C) 97.7 F (36.5 C) 97.9 F (36.6 C) 98 F (36.7 C)  TempSrc: Oral Oral Oral Oral  SpO2: 98% 99% 98% 96%  Weight:      Height:        Intake/Output Summary (Last 24 hours) at 08/17/16 0941 Last data filed at 08/17/16 0919  Gross per 24 hour  Intake              360 ml  Output              1000 ml  Net             -640 ml   Filed Weights   08/14/16 1438 08/14/16 1928 08/16/16 0358  Weight: 254 lb (115.2 kg) 243 lb 12.8 oz (110.6 kg) 238 lb 12.8 oz (108.3 kg)    Telemetry    NA - Personally Reviewed  ECG    NA - Personally Reviewed  Physical Exam   GEN: No acute distress.   Neck: No JVD Cardiac: Irregular, no murmurs, rubs, or gallops.  Respiratory: Clear to auscultation bilaterally. GI: Soft, nontender, non-distended  MS: Moderateedema; No deformity. Neuro:  Nonfocal  Psych: Normal affect   Labs    Chemistry Recent Labs Lab 08/14/16 1437 08/16/16 0247 08/17/16 0504  NA 130* 131* 132*  K 4.4 4.6 4.4  CL 93* 94* 93*  CO2 27 29 28   GLUCOSE 300* 298* 297*  BUN 62* 58* 60*  CREATININE 2.49* 2.25* 2.15*  CALCIUM 8.8* 8.7* 8.7*  GFRNONAA 22* 25* 26*  GFRAA 25* 28* 30*  ANIONGAP 10 8 11      Hematology Recent Labs Lab 08/14/16 1437 08/15/16 0419 08/16/16 0247 08/17/16  0504  WBC 6.8  --  6.0 5.7  RBC 2.99*  --  3.15* 3.31*  HGB 8.1* 9.3* 8.6* 9.1*  HCT 25.0*  --  26.2* 27.5*  MCV 83.5  --  83.0 82.9  MCH 26.9  --  27.2 27.6  MCHC 32.3  --  32.8 33.2  RDW 17.5*  --  16.9* 16.3*  PLT 212  --  217 204    Cardiac Enzymes Recent Labs Lab 08/14/16 1437  TROPONINI <0.03   No results for input(s): TROPIPOC in the last 168 hours.   BNP Recent Labs Lab 08/14/16 1437  BNP 251.0*     DDimer No results for input(s): DDIMER in the last 168 hours.   Radiology    Dg Chest 2 View  Result Date: 08/16/2016 CLINICAL DATA:  Severe shortness of breath. Congestive heart failure. EXAM: CHEST  2 VIEW COMPARISON:  08/14/2016 FINDINGS: Stable mild cardiomegaly. Previous aortic valve replacement. Decreased interstitial prominence, consistent with decreased mild interstitial edema. No evidence of airspace disease. Tiny right pleural effusion is decreased in size since previous study. Aortic atherosclerosis. IMPRESSION: Decreased mild interstitial edema and  tiny right pleural effusion. Stable cardiomegaly. Electronically Signed   By: Earle Gell M.D.   On: 08/16/2016 09:17    Cardiac Studies   NA  Patient Profile     83 y.o. male with history of CAD s/p CABG x 1 in 07/2007, chronic Afib s/p Watchman device in 08/2015 previously on Eliquis with GI bleed noted in the setting of AV malformations, severe aortic stenosis s/p bioprosthetic AVR, chronic diastolic CHF, stroke, and bladder cancer, who presented to [facility] ED With progressive leg swelling and shortness of breath.   Assessment & Plan    ACUTE ON CHRONIC DIASTOLIC HF:    Down 5 liters since admission.  Weight is down.   He is going home today and we covered needing to keep his feet up and possibly compression stockings.     ANEMIA:  Status post two units.  Follow as an out patient.   ATRIAL FIB/PERMANENT:  No anticoagulation secondary to GI bleed.  Rate is controlled.   CAD:  No evidence of active ischemia.  Continue medical management.    CKD:  Stage IV  V  Creat is stable.   Signed, Minus Breeding, MD  08/17/2016, 9:41 AM

## 2016-08-17 NOTE — Discharge Planning (Signed)
[facility]  Patient Status: ACTIVE  [facility] is providing the following services: SN,PT,OT  If patient discharges after hours, please call [phone].   Melene Muller 08/17/2016, 8:45 AM

## 2016-08-17 NOTE — Progress Notes (Signed)
Discharge paperwork reviewed with patient and family member at the bedside. Information regarding follow-up appointments, medications and education for all newly prescribed meds was provided, all questions answered. Peripheral IV removed, catheter intact. Heart monitor removed, returned to the nurse station. Transport requested via wheelchair to the lobby for discharge.

## 2016-08-17 NOTE — Care Management Note (Signed)
Case Management Note  Patient Details  [name] MRN: [id number] Date of Birth: 1927/10/16  Subjective/Objective:         Home Health orders for PT, RN, OT resumed with [facility] . Jerm[facility] at [facility] is aware of discharge today. .            Action/Plan:   Expected Discharge Date:  08/17/16               Expected Discharge Plan:     In-House Referral:     Discharge planning Services     Post Acute Care Choice:    Choice offered to:     DME Arranged:    DME Agency:     HH Arranged:    HH Agency:     Status of Service:     If discussed at [employer], dates discussed:    Additional Comments:  Lucien Budney A, RN 08/17/2016, 8:53 AM

## 2016-08-18 DIAGNOSIS — E1122 Type 2 diabetes mellitus with diabetic chronic kidney disease: Secondary | ICD-10-CM | POA: Diagnosis not present

## 2016-08-18 DIAGNOSIS — N184 Chronic kidney disease, stage 4 (severe): Secondary | ICD-10-CM | POA: Diagnosis not present

## 2016-08-18 DIAGNOSIS — I5032 Chronic diastolic (congestive) heart failure: Secondary | ICD-10-CM | POA: Diagnosis not present

## 2016-08-18 DIAGNOSIS — I130 Hypertensive heart and chronic kidney disease with heart failure and stage 1 through stage 4 chronic kidney disease, or unspecified chronic kidney disease: Secondary | ICD-10-CM | POA: Diagnosis not present

## 2016-08-18 DIAGNOSIS — D638 Anemia in other chronic diseases classified elsewhere: Secondary | ICD-10-CM | POA: Diagnosis not present

## 2016-08-18 DIAGNOSIS — I69354 Hemiplegia and hemiparesis following cerebral infarction affecting left non-dominant side: Secondary | ICD-10-CM | POA: Diagnosis not present

## 2016-08-19 ENCOUNTER — Telehealth: Payer: Self-pay | Admitting: *Deleted

## 2016-08-19 ENCOUNTER — Inpatient Hospital Stay: Payer: Medicare Other

## 2016-08-19 DIAGNOSIS — E1122 Type 2 diabetes mellitus with diabetic chronic kidney disease: Secondary | ICD-10-CM | POA: Diagnosis not present

## 2016-08-19 DIAGNOSIS — I130 Hypertensive heart and chronic kidney disease with heart failure and stage 1 through stage 4 chronic kidney disease, or unspecified chronic kidney disease: Secondary | ICD-10-CM | POA: Diagnosis not present

## 2016-08-19 DIAGNOSIS — N184 Chronic kidney disease, stage 4 (severe): Secondary | ICD-10-CM | POA: Diagnosis not present

## 2016-08-19 DIAGNOSIS — I5032 Chronic diastolic (congestive) heart failure: Secondary | ICD-10-CM | POA: Diagnosis not present

## 2016-08-19 DIAGNOSIS — I69354 Hemiplegia and hemiparesis following cerebral infarction affecting left non-dominant side: Secondary | ICD-10-CM | POA: Diagnosis not present

## 2016-08-19 DIAGNOSIS — D638 Anemia in other chronic diseases classified elsewhere: Secondary | ICD-10-CM | POA: Diagnosis not present

## 2016-08-19 NOTE — Telephone Encounter (Signed)
Unable to reach patient.

## 2016-08-19 NOTE — Telephone Encounter (Signed)
Left message for patient that he should have his labs checked today and check to see if he needed to receive his procrit. Instructed to call for questions.

## 2016-08-19 NOTE — Telephone Encounter (Signed)
Patient advised per Dr Mike Gip to keep appt today, he stated he does not have a ride today and so he was transferred to scheduler to reschedule for tomorrow

## 2016-08-19 NOTE — Telephone Encounter (Signed)
Asking if he needs to keep appt today since he was in hospital last week and got a transfusion and perhaps reschedule for next week?

## 2016-08-20 ENCOUNTER — Telehealth: Payer: Self-pay | Admitting: Internal Medicine

## 2016-08-20 ENCOUNTER — Inpatient Hospital Stay: Payer: Medicare Other

## 2016-08-20 ENCOUNTER — Inpatient Hospital Stay: Payer: Medicare Other | Attending: Hematology and Oncology

## 2016-08-20 VITALS — BP 124/64 | HR 64

## 2016-08-20 DIAGNOSIS — I129 Hypertensive chronic kidney disease with stage 1 through stage 4 chronic kidney disease, or unspecified chronic kidney disease: Secondary | ICD-10-CM | POA: Insufficient documentation

## 2016-08-20 DIAGNOSIS — D631 Anemia in chronic kidney disease: Secondary | ICD-10-CM | POA: Insufficient documentation

## 2016-08-20 DIAGNOSIS — Z029 Encounter for administrative examinations, unspecified: Secondary | ICD-10-CM | POA: Insufficient documentation

## 2016-08-20 DIAGNOSIS — N184 Chronic kidney disease, stage 4 (severe): Secondary | ICD-10-CM | POA: Diagnosis not present

## 2016-08-20 DIAGNOSIS — N183 Chronic kidney disease, stage 3 unspecified: Secondary | ICD-10-CM

## 2016-08-20 DIAGNOSIS — Z79899 Other long term (current) drug therapy: Secondary | ICD-10-CM | POA: Insufficient documentation

## 2016-08-20 DIAGNOSIS — D500 Iron deficiency anemia secondary to blood loss (chronic): Secondary | ICD-10-CM

## 2016-08-20 LAB — HEMOGLOBIN: Hemoglobin: 8.9 g/dL — ABNORMAL LOW (ref 13.0–18.0)

## 2016-08-20 LAB — SAMPLE TO BLOOD BANK

## 2016-08-20 MED ORDER — EPOETIN ALFA 10000 UNIT/ML IJ SOLN
10000.0000 [IU] | Freq: Once | INTRAMUSCULAR | Status: AC
  Administered 2016-08-20: 10000 [IU] via SUBCUTANEOUS
  Filled 2016-08-20: qty 2

## 2016-08-20 NOTE — Telephone Encounter (Signed)
Not necessary right this momemtn one month follow up for diabetes needed

## 2016-08-20 NOTE — Telephone Encounter (Signed)
Patient was discharged on 08/17/16 and followed up with Oncology on 08/20/16, TCM not eligible due to patient was followed first by Oncology, please advise if you also want to see patient has no follow up scheduled with PCP at this time.

## 2016-08-21 ENCOUNTER — Telehealth: Payer: Self-pay | Admitting: *Deleted

## 2016-08-21 DIAGNOSIS — N184 Chronic kidney disease, stage 4 (severe): Secondary | ICD-10-CM | POA: Diagnosis not present

## 2016-08-21 DIAGNOSIS — I69354 Hemiplegia and hemiparesis following cerebral infarction affecting left non-dominant side: Secondary | ICD-10-CM | POA: Diagnosis not present

## 2016-08-21 DIAGNOSIS — D638 Anemia in other chronic diseases classified elsewhere: Secondary | ICD-10-CM | POA: Diagnosis not present

## 2016-08-21 DIAGNOSIS — I130 Hypertensive heart and chronic kidney disease with heart failure and stage 1 through stage 4 chronic kidney disease, or unspecified chronic kidney disease: Secondary | ICD-10-CM | POA: Diagnosis not present

## 2016-08-21 DIAGNOSIS — I5032 Chronic diastolic (congestive) heart failure: Secondary | ICD-10-CM | POA: Diagnosis not present

## 2016-08-21 DIAGNOSIS — E1122 Type 2 diabetes mellitus with diabetic chronic kidney disease: Secondary | ICD-10-CM | POA: Diagnosis not present

## 2016-08-22 ENCOUNTER — Telehealth: Payer: Self-pay | Admitting: Internal Medicine

## 2016-08-22 ENCOUNTER — Other Ambulatory Visit: Payer: Self-pay | Admitting: Internal Medicine

## 2016-08-22 ENCOUNTER — Telehealth: Payer: Self-pay | Admitting: *Deleted

## 2016-08-22 DIAGNOSIS — E1122 Type 2 diabetes mellitus with diabetic chronic kidney disease: Secondary | ICD-10-CM | POA: Diagnosis not present

## 2016-08-22 DIAGNOSIS — D638 Anemia in other chronic diseases classified elsewhere: Secondary | ICD-10-CM | POA: Diagnosis not present

## 2016-08-22 DIAGNOSIS — I5032 Chronic diastolic (congestive) heart failure: Secondary | ICD-10-CM | POA: Diagnosis not present

## 2016-08-22 DIAGNOSIS — I130 Hypertensive heart and chronic kidney disease with heart failure and stage 1 through stage 4 chronic kidney disease, or unspecified chronic kidney disease: Secondary | ICD-10-CM | POA: Diagnosis not present

## 2016-08-22 DIAGNOSIS — I69354 Hemiplegia and hemiparesis following cerebral infarction affecting left non-dominant side: Secondary | ICD-10-CM | POA: Diagnosis not present

## 2016-08-22 DIAGNOSIS — N184 Chronic kidney disease, stage 4 (severe): Secondary | ICD-10-CM | POA: Diagnosis not present

## 2016-08-22 NOTE — Telephone Encounter (Signed)
FYI to let you know the pt declined OT.

## 2016-08-22 NOTE — Telephone Encounter (Signed)
Erroneous entry

## 2016-08-22 NOTE — Telephone Encounter (Signed)
[name] from [place] called and stated that OT eval is the same as it was from a couple of weeks ago, there is not need for OT.   [name] [phone]

## 2016-08-23 DIAGNOSIS — E1122 Type 2 diabetes mellitus with diabetic chronic kidney disease: Secondary | ICD-10-CM | POA: Diagnosis not present

## 2016-08-23 DIAGNOSIS — N184 Chronic kidney disease, stage 4 (severe): Secondary | ICD-10-CM | POA: Diagnosis not present

## 2016-08-23 DIAGNOSIS — I130 Hypertensive heart and chronic kidney disease with heart failure and stage 1 through stage 4 chronic kidney disease, or unspecified chronic kidney disease: Secondary | ICD-10-CM | POA: Diagnosis not present

## 2016-08-23 DIAGNOSIS — D638 Anemia in other chronic diseases classified elsewhere: Secondary | ICD-10-CM | POA: Diagnosis not present

## 2016-08-23 DIAGNOSIS — I69354 Hemiplegia and hemiparesis following cerebral infarction affecting left non-dominant side: Secondary | ICD-10-CM | POA: Diagnosis not present

## 2016-08-23 DIAGNOSIS — I5032 Chronic diastolic (congestive) heart failure: Secondary | ICD-10-CM | POA: Diagnosis not present

## 2016-08-24 ENCOUNTER — Inpatient Hospital Stay
Admission: EM | Admit: 2016-08-24 | Discharge: 2016-08-29 | DRG: 291 | Disposition: A | Discharge status: Home Health | Payer: Medicare Other | Attending: Internal Medicine | Admitting: Internal Medicine

## 2016-08-24 ENCOUNTER — Encounter: Payer: Self-pay | Admitting: Emergency Medicine

## 2016-08-24 ENCOUNTER — Emergency Department: Payer: Medicare Other

## 2016-08-24 DIAGNOSIS — K219 Gastro-esophageal reflux disease without esophagitis: Secondary | ICD-10-CM | POA: Diagnosis present

## 2016-08-24 DIAGNOSIS — A0811 Acute gastroenteropathy due to Norwalk agent: Secondary | ICD-10-CM | POA: Diagnosis not present

## 2016-08-24 DIAGNOSIS — E1122 Type 2 diabetes mellitus with diabetic chronic kidney disease: Secondary | ICD-10-CM | POA: Diagnosis present

## 2016-08-24 DIAGNOSIS — I272 Pulmonary hypertension, unspecified: Secondary | ICD-10-CM

## 2016-08-24 DIAGNOSIS — E871 Hypo-osmolality and hyponatremia: Secondary | ICD-10-CM

## 2016-08-24 DIAGNOSIS — M1990 Unspecified osteoarthritis, unspecified site: Secondary | ICD-10-CM | POA: Diagnosis present

## 2016-08-24 DIAGNOSIS — F329 Major depressive disorder, single episode, unspecified: Secondary | ICD-10-CM | POA: Diagnosis present

## 2016-08-24 DIAGNOSIS — L120 Bullous pemphigoid: Secondary | ICD-10-CM | POA: Diagnosis present

## 2016-08-24 DIAGNOSIS — J449 Chronic obstructive pulmonary disease, unspecified: Secondary | ICD-10-CM | POA: Diagnosis not present

## 2016-08-24 DIAGNOSIS — R7989 Other specified abnormal findings of blood chemistry: Secondary | ICD-10-CM

## 2016-08-24 DIAGNOSIS — I350 Nonrheumatic aortic (valve) stenosis: Secondary | ICD-10-CM | POA: Diagnosis present

## 2016-08-24 DIAGNOSIS — Z951 Presence of aortocoronary bypass graft: Secondary | ICD-10-CM

## 2016-08-24 DIAGNOSIS — E1165 Type 2 diabetes mellitus with hyperglycemia: Secondary | ICD-10-CM | POA: Diagnosis not present

## 2016-08-24 DIAGNOSIS — I110 Hypertensive heart disease with heart failure: Secondary | ICD-10-CM | POA: Diagnosis not present

## 2016-08-24 DIAGNOSIS — R262 Difficulty in walking, not elsewhere classified: Secondary | ICD-10-CM | POA: Diagnosis not present

## 2016-08-24 DIAGNOSIS — M6281 Muscle weakness (generalized): Secondary | ICD-10-CM | POA: Diagnosis not present

## 2016-08-24 DIAGNOSIS — I69334 Monoplegia of upper limb following cerebral infarction affecting left non-dominant side: Secondary | ICD-10-CM | POA: Diagnosis not present

## 2016-08-24 DIAGNOSIS — I130 Hypertensive heart and chronic kidney disease with heart failure and stage 1 through stage 4 chronic kidney disease, or unspecified chronic kidney disease: Secondary | ICD-10-CM | POA: Diagnosis not present

## 2016-08-24 DIAGNOSIS — H3530 Unspecified macular degeneration: Secondary | ICD-10-CM | POA: Diagnosis present

## 2016-08-24 DIAGNOSIS — D649 Anemia, unspecified: Secondary | ICD-10-CM | POA: Diagnosis not present

## 2016-08-24 DIAGNOSIS — E1142 Type 2 diabetes mellitus with diabetic polyneuropathy: Secondary | ICD-10-CM | POA: Diagnosis not present

## 2016-08-24 DIAGNOSIS — I5022 Chronic systolic (congestive) heart failure: Secondary | ICD-10-CM

## 2016-08-24 DIAGNOSIS — I5033 Acute on chronic diastolic (congestive) heart failure: Secondary | ICD-10-CM | POA: Diagnosis present

## 2016-08-24 DIAGNOSIS — I482 Chronic atrial fibrillation, unspecified: Secondary | ICD-10-CM

## 2016-08-24 DIAGNOSIS — N400 Enlarged prostate without lower urinary tract symptoms: Secondary | ICD-10-CM | POA: Diagnosis present

## 2016-08-24 DIAGNOSIS — I2510 Atherosclerotic heart disease of native coronary artery without angina pectoris: Secondary | ICD-10-CM | POA: Diagnosis not present

## 2016-08-24 DIAGNOSIS — Z7982 Long term (current) use of aspirin: Secondary | ICD-10-CM | POA: Diagnosis not present

## 2016-08-24 DIAGNOSIS — J810 Acute pulmonary edema: Secondary | ICD-10-CM

## 2016-08-24 DIAGNOSIS — I82502 Chronic embolism and thrombosis of unspecified deep veins of left lower extremity: Secondary | ICD-10-CM | POA: Diagnosis not present

## 2016-08-24 DIAGNOSIS — R531 Weakness: Secondary | ICD-10-CM

## 2016-08-24 DIAGNOSIS — R0602 Shortness of breath: Secondary | ICD-10-CM | POA: Diagnosis not present

## 2016-08-24 DIAGNOSIS — Z87891 Personal history of nicotine dependence: Secondary | ICD-10-CM

## 2016-08-24 DIAGNOSIS — I2489 Other forms of acute ischemic heart disease: Secondary | ICD-10-CM

## 2016-08-24 DIAGNOSIS — Z8673 Personal history of transient ischemic attack (TIA), and cerebral infarction without residual deficits: Secondary | ICD-10-CM | POA: Diagnosis not present

## 2016-08-24 DIAGNOSIS — E785 Hyperlipidemia, unspecified: Secondary | ICD-10-CM | POA: Diagnosis present

## 2016-08-24 DIAGNOSIS — F0390 Unspecified dementia without behavioral disturbance: Secondary | ICD-10-CM

## 2016-08-24 DIAGNOSIS — H353190 Nonexudative age-related macular degeneration, unspecified eye, stage unspecified: Secondary | ICD-10-CM | POA: Diagnosis not present

## 2016-08-24 DIAGNOSIS — R778 Other specified abnormalities of plasma proteins: Secondary | ICD-10-CM

## 2016-08-24 DIAGNOSIS — M0589 Other rheumatoid arthritis with rheumatoid factor of multiple sites: Secondary | ICD-10-CM | POA: Diagnosis not present

## 2016-08-24 DIAGNOSIS — N184 Chronic kidney disease, stage 4 (severe): Secondary | ICD-10-CM

## 2016-08-24 DIAGNOSIS — D509 Iron deficiency anemia, unspecified: Secondary | ICD-10-CM | POA: Diagnosis not present

## 2016-08-24 DIAGNOSIS — Z953 Presence of xenogenic heart valve: Secondary | ICD-10-CM | POA: Diagnosis not present

## 2016-08-24 DIAGNOSIS — I5031 Acute diastolic (congestive) heart failure: Secondary | ICD-10-CM | POA: Diagnosis not present

## 2016-08-24 DIAGNOSIS — N179 Acute kidney failure, unspecified: Secondary | ICD-10-CM | POA: Diagnosis present

## 2016-08-24 DIAGNOSIS — I248 Other forms of acute ischemic heart disease: Secondary | ICD-10-CM | POA: Diagnosis present

## 2016-08-24 DIAGNOSIS — D631 Anemia in chronic kidney disease: Secondary | ICD-10-CM | POA: Diagnosis not present

## 2016-08-24 DIAGNOSIS — Z8551 Personal history of malignant neoplasm of bladder: Secondary | ICD-10-CM

## 2016-08-24 DIAGNOSIS — R748 Abnormal levels of other serum enzymes: Secondary | ICD-10-CM | POA: Diagnosis not present

## 2016-08-24 DIAGNOSIS — I10 Essential (primary) hypertension: Secondary | ICD-10-CM | POA: Diagnosis not present

## 2016-08-24 DIAGNOSIS — N183 Chronic kidney disease, stage 3 (moderate): Secondary | ICD-10-CM | POA: Diagnosis not present

## 2016-08-24 DIAGNOSIS — N189 Chronic kidney disease, unspecified: Secondary | ICD-10-CM | POA: Diagnosis not present

## 2016-08-24 DIAGNOSIS — I25118 Atherosclerotic heart disease of native coronary artery with other forms of angina pectoris: Secondary | ICD-10-CM | POA: Diagnosis not present

## 2016-08-24 LAB — CBC WITH DIFFERENTIAL/PLATELET
BASOS PCT: 1 %
Basophils Absolute: 0.1 10*3/uL (ref 0–0.1)
EOS ABS: 0.5 10*3/uL (ref 0–0.7)
Eosinophils Relative: 8 %
HCT: 27.2 % — ABNORMAL LOW (ref 40.0–52.0)
HEMOGLOBIN: 9.2 g/dL — AB (ref 13.0–18.0)
Lymphocytes Relative: 8 %
Lymphs Abs: 0.5 10*3/uL — ABNORMAL LOW (ref 1.0–3.6)
MCH: 28.2 pg (ref 26.0–34.0)
MCHC: 33.7 g/dL (ref 32.0–36.0)
MCV: 83.7 fL (ref 80.0–100.0)
MONOS PCT: 14 %
Monocytes Absolute: 0.8 10*3/uL (ref 0.2–1.0)
NEUTROS PCT: 69 %
Neutro Abs: 4.1 10*3/uL (ref 1.4–6.5)
Platelets: 234 10*3/uL (ref 150–440)
RBC: 3.25 MIL/uL — ABNORMAL LOW (ref 4.40–5.90)
RDW: 16.5 % — AB (ref 11.5–14.5)
WBC: 6 10*3/uL (ref 3.8–10.6)

## 2016-08-24 LAB — TROPONIN I: TROPONIN I: 0.03 ng/mL — AB (ref <0.03)

## 2016-08-24 LAB — GLUCOSE, CAPILLARY: Glucose-Capillary: 311 mg/dL — ABNORMAL HIGH (ref 65–99)

## 2016-08-24 LAB — BASIC METABOLIC PANEL
Anion gap: 8 (ref 5–15)
BUN: 53 mg/dL — ABNORMAL HIGH (ref 6–20)
CO2: 27 mmol/L (ref 22–32)
CREATININE: 2.61 mg/dL — AB (ref 0.61–1.24)
Calcium: 8.6 mg/dL — ABNORMAL LOW (ref 8.9–10.3)
Chloride: 94 mmol/L — ABNORMAL LOW (ref 101–111)
GFR calc non Af Amer: 21 mL/min — ABNORMAL LOW (ref >60)
GFR, EST AFRICAN AMERICAN: 24 mL/min — AB (ref >60)
Glucose, Bld: 207 mg/dL — ABNORMAL HIGH (ref 65–99)
Potassium: 4.3 mmol/L (ref 3.5–5.1)
SODIUM: 129 mmol/L — AB (ref 135–145)

## 2016-08-24 LAB — BRAIN NATRIURETIC PEPTIDE: B NATRIURETIC PEPTIDE 5: 371 pg/mL — AB (ref 0.0–100.0)

## 2016-08-24 LAB — TSH: TSH: 4.713 u[IU]/mL — ABNORMAL HIGH (ref 0.350–4.500)

## 2016-08-24 MED ORDER — CALCITRIOL 0.25 MCG PO CAPS
0.2500 ug | ORAL_CAPSULE | ORAL | Status: DC
  Administered 2016-08-26 – 2016-08-28 (×2): 0.25 ug via ORAL
  Filled 2016-08-24 (×2): qty 1

## 2016-08-24 MED ORDER — INSULIN ASPART 100 UNIT/ML ~~LOC~~ SOLN
15.0000 [IU] | Freq: Once | SUBCUTANEOUS | Status: AC
  Administered 2016-08-24: 15 [IU] via SUBCUTANEOUS
  Filled 2016-08-24: qty 15

## 2016-08-24 MED ORDER — SODIUM CHLORIDE 0.9 % IV SOLN: 250.0000 mL | INTRAVENOUS | Status: DC | PRN

## 2016-08-24 MED ORDER — HYDROXYCHLOROQUINE SULFATE 200 MG PO TABS
200.0000 mg | ORAL_TABLET | Freq: Every day | ORAL | Status: DC
  Administered 2016-08-25 – 2016-08-29 (×5): 200 mg via ORAL
  Filled 2016-08-24 (×5): qty 1

## 2016-08-24 MED ORDER — ALPRAZOLAM 0.25 MG PO TABS: 0.2500 mg | ORAL_TABLET | Freq: Every evening | ORAL | Status: DC | PRN

## 2016-08-24 MED ORDER — ISOSORBIDE MONONITRATE ER 30 MG PO TB24
30.0000 mg | ORAL_TABLET | Freq: Every day | ORAL | Status: DC
  Administered 2016-08-25 – 2016-08-29 (×5): 30 mg via ORAL
  Filled 2016-08-24 (×5): qty 1

## 2016-08-24 MED ORDER — VITAMIN D 1000 UNITS PO TABS
1000.0000 [IU] | ORAL_TABLET | Freq: Every day | ORAL | Status: DC
  Administered 2016-08-25 – 2016-08-29 (×5): 1000 [IU] via ORAL
  Filled 2016-08-24 (×5): qty 1

## 2016-08-24 MED ORDER — FUROSEMIDE 10 MG/ML IJ SOLN
40.0000 mg | Freq: Once | INTRAMUSCULAR | Status: AC
  Administered 2016-08-24: 40 mg via INTRAVENOUS
  Filled 2016-08-24: qty 4

## 2016-08-24 MED ORDER — PANTOPRAZOLE SODIUM 40 MG PO TBEC
40.0000 mg | DELAYED_RELEASE_TABLET | Freq: Every day | ORAL | Status: DC
  Administered 2016-08-25 – 2016-08-29 (×5): 40 mg via ORAL
  Filled 2016-08-24 (×5): qty 1

## 2016-08-24 MED ORDER — CARVEDILOL 6.25 MG PO TABS
6.2500 mg | ORAL_TABLET | Freq: Two times a day (BID) | ORAL | Status: DC
  Administered 2016-08-24 – 2016-08-29 (×10): 6.25 mg via ORAL
  Filled 2016-08-24 (×10): qty 1

## 2016-08-24 MED ORDER — SODIUM CHLORIDE 0.9% FLUSH
3.0000 mL | Freq: Two times a day (BID) | INTRAVENOUS | Status: DC
  Administered 2016-08-25 – 2016-08-29 (×10): 3 mL via INTRAVENOUS

## 2016-08-24 MED ORDER — FUROSEMIDE 10 MG/ML IJ SOLN
40.0000 mg | Freq: Two times a day (BID) | INTRAMUSCULAR | Status: DC
  Administered 2016-08-25 – 2016-08-26 (×3): 40 mg via INTRAVENOUS
  Filled 2016-08-24 (×3): qty 4

## 2016-08-24 MED ORDER — TORSEMIDE 20 MG PO TABS
40.0000 mg | ORAL_TABLET | Freq: Two times a day (BID) | ORAL | Status: DC
  Administered 2016-08-25: 40 mg via ORAL
  Filled 2016-08-24: qty 2

## 2016-08-24 MED ORDER — DOCUSATE SODIUM 50 MG PO CAPS
50.0000 mg | ORAL_CAPSULE | Freq: Two times a day (BID) | ORAL | Status: DC
  Filled 2016-08-24: qty 1

## 2016-08-24 MED ORDER — ADULT MULTIVITAMIN W/MINERALS CH
1.0000 | ORAL_TABLET | Freq: Every day | ORAL | Status: DC
  Administered 2016-08-25 – 2016-08-29 (×5): 1 via ORAL
  Filled 2016-08-24 (×5): qty 1

## 2016-08-24 MED ORDER — FERROUS SULFATE 325 (65 FE) MG PO TABS
325.0000 mg | ORAL_TABLET | Freq: Once | ORAL | Status: AC
  Administered 2016-08-24: 325 mg via ORAL
  Filled 2016-08-24: qty 1

## 2016-08-24 MED ORDER — ACETAMINOPHEN 325 MG PO TABS
650.0000 mg | ORAL_TABLET | ORAL | Status: DC | PRN
  Administered 2016-08-24: 650 mg via ORAL
  Filled 2016-08-24: qty 2

## 2016-08-24 MED ORDER — HEPARIN SODIUM (PORCINE) 5000 UNIT/ML IJ SOLN
5000.0000 [IU] | Freq: Three times a day (TID) | INTRAMUSCULAR | Status: DC
  Administered 2016-08-25 – 2016-08-29 (×15): 5000 [IU] via SUBCUTANEOUS
  Filled 2016-08-24 (×15): qty 1

## 2016-08-24 MED ORDER — KETOTIFEN FUMARATE 0.025 % OP SOLN
1.0000 [drp] | Freq: Two times a day (BID) | OPHTHALMIC | Status: DC
  Administered 2016-08-24 – 2016-08-29 (×10): 1 [drp] via OPHTHALMIC
  Filled 2016-08-24: qty 5

## 2016-08-24 MED ORDER — LORATADINE 10 MG PO TABS
10.0000 mg | ORAL_TABLET | Freq: Every day | ORAL | Status: DC
  Administered 2016-08-25 – 2016-08-29 (×5): 10 mg via ORAL
  Filled 2016-08-24 (×6): qty 1

## 2016-08-24 MED ORDER — SODIUM CHLORIDE 0.9% FLUSH: 3.0000 mL | INTRAVENOUS | Status: DC | PRN

## 2016-08-24 MED ORDER — ASPIRIN EC 81 MG PO TBEC
81.0000 mg | DELAYED_RELEASE_TABLET | Freq: Every day | ORAL | Status: DC
  Administered 2016-08-25 – 2016-08-29 (×5): 81 mg via ORAL
  Filled 2016-08-24 (×5): qty 1

## 2016-08-24 MED ORDER — PRESERVISION AREDS 2 PO CAPS
1.0000 | ORAL_CAPSULE | Freq: Two times a day (BID) | ORAL | Status: DC
  Filled 2016-08-24: qty 1

## 2016-08-24 MED ORDER — HYDROCODONE-ACETAMINOPHEN 5-325 MG PO TABS: 1.0000 | ORAL_TABLET | Freq: Every day | ORAL | Status: DC | PRN

## 2016-08-24 MED ORDER — TAMSULOSIN HCL 0.4 MG PO CAPS
0.4000 mg | ORAL_CAPSULE | Freq: Every day | ORAL | Status: DC
  Administered 2016-08-24 – 2016-08-28 (×5): 0.4 mg via ORAL
  Filled 2016-08-24 (×6): qty 1

## 2016-08-24 MED ORDER — VITAMIN C 500 MG PO TABS
500.0000 mg | ORAL_TABLET | Freq: Every day | ORAL | Status: DC
  Administered 2016-08-25 – 2016-08-29 (×5): 500 mg via ORAL
  Filled 2016-08-24 (×6): qty 1

## 2016-08-24 MED ORDER — OCUVITE-LUTEIN PO CAPS
1.0000 | ORAL_CAPSULE | Freq: Two times a day (BID) | ORAL | Status: DC
  Administered 2016-08-25 – 2016-08-29 (×9): 1 via ORAL
  Filled 2016-08-24 (×11): qty 1

## 2016-08-24 MED ORDER — ESCITALOPRAM OXALATE 10 MG PO TABS
10.0000 mg | ORAL_TABLET | Freq: Every day | ORAL | Status: DC
  Administered 2016-08-25 – 2016-08-29 (×5): 10 mg via ORAL
  Filled 2016-08-24 (×5): qty 1

## 2016-08-24 MED ORDER — INSULIN GLARGINE 100 UNIT/ML ~~LOC~~ SOLN
35.0000 [IU] | Freq: Every day | SUBCUTANEOUS | Status: DC
  Administered 2016-08-25 – 2016-08-26 (×2): 35 [IU] via SUBCUTANEOUS
  Filled 2016-08-24 (×3): qty 0.35

## 2016-08-24 MED ORDER — FERROUS SULFATE 325 (65 FE) MG PO TABS
325.0000 mg | ORAL_TABLET | Freq: Two times a day (BID) | ORAL | Status: DC
  Administered 2016-08-25 – 2016-08-29 (×9): 325 mg via ORAL
  Filled 2016-08-24 (×9): qty 1

## 2016-08-24 MED ORDER — ONDANSETRON HCL 4 MG/2ML IJ SOLN: 4.0000 mg | Freq: Four times a day (QID) | INTRAMUSCULAR | Status: DC | PRN

## 2016-08-24 NOTE — ED Notes (Signed)
Caregiver at bedside

## 2016-08-24 NOTE — ED Notes (Signed)
Patient transported to X-ray 
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
## 2016-08-24 NOTE — ED Notes (Signed)
Patient reports that he feels like he can breathe better.

## 2016-08-24 NOTE — ED Notes (Signed)
Food given to patient. Patient is more comfortable on the side of the bed at this time. EDP aware

## 2016-08-24 NOTE — ED Notes (Signed)
Admitting doctor has not been in with patient. Asked if I could wait and take patient up once she was done.

## 2016-08-24 NOTE — ED Triage Notes (Signed)
Pt arrived via EMS from home for reports of SOB and weakness. Pt recently discharged after being treated for CHF. EMS reports 97% RA, HR 63, etCO2 32, BP 130/52, CBG 201.

## 2016-08-25 DIAGNOSIS — I2510 Atherosclerotic heart disease of native coronary artery without angina pectoris: Secondary | ICD-10-CM

## 2016-08-25 DIAGNOSIS — I482 Chronic atrial fibrillation: Secondary | ICD-10-CM

## 2016-08-25 DIAGNOSIS — Z953 Presence of xenogenic heart valve: Secondary | ICD-10-CM

## 2016-08-25 DIAGNOSIS — I5033 Acute on chronic diastolic (congestive) heart failure: Secondary | ICD-10-CM

## 2016-08-25 LAB — GLUCOSE, CAPILLARY
GLUCOSE-CAPILLARY: 312 mg/dL — AB (ref 65–99)
GLUCOSE-CAPILLARY: 71 mg/dL (ref 65–99)
Glucose-Capillary: 143 mg/dL — ABNORMAL HIGH (ref 65–99)
Glucose-Capillary: 75 mg/dL (ref 65–99)
Glucose-Capillary: 120 mg/dL — ABNORMAL HIGH (ref 65–99)

## 2016-08-25 LAB — BASIC METABOLIC PANEL
ANION GAP: 8 (ref 5–15)
BUN: 56 mg/dL — AB (ref 6–20)
CHLORIDE: 97 mmol/L — AB (ref 101–111)
CO2: 29 mmol/L (ref 22–32)
Calcium: 8.7 mg/dL — ABNORMAL LOW (ref 8.9–10.3)
Creatinine, Ser: 2.49 mg/dL — ABNORMAL HIGH (ref 0.61–1.24)
GFR calc non Af Amer: 22 mL/min — ABNORMAL LOW (ref >60)
GFR, EST AFRICAN AMERICAN: 25 mL/min — AB (ref >60)
Glucose, Bld: 100 mg/dL — ABNORMAL HIGH (ref 65–99)
POTASSIUM: 3.6 mmol/L (ref 3.5–5.1)
Sodium: 134 mmol/L — ABNORMAL LOW (ref 135–145)

## 2016-08-25 MED ORDER — INSULIN ASPART 100 UNIT/ML ~~LOC~~ SOLN
0.0000 [IU] | Freq: Three times a day (TID) | SUBCUTANEOUS | Status: DC
  Administered 2016-08-25: 2 [IU] via SUBCUTANEOUS
  Administered 2016-08-25: 11 [IU] via SUBCUTANEOUS
  Administered 2016-08-26: 3 [IU] via SUBCUTANEOUS
  Administered 2016-08-26: 8 [IU] via SUBCUTANEOUS
  Filled 2016-08-25: qty 8
  Filled 2016-08-25: qty 2
  Filled 2016-08-25: qty 11
  Filled 2016-08-25: qty 3

## 2016-08-25 MED ORDER — INSULIN ASPART 100 UNIT/ML ~~LOC~~ SOLN
4.0000 [IU] | Freq: Three times a day (TID) | SUBCUTANEOUS | Status: DC
  Administered 2016-08-25 – 2016-08-26 (×4): 4 [IU] via SUBCUTANEOUS
  Filled 2016-08-25 (×4): qty 4

## 2016-08-25 MED ORDER — INSULIN ASPART 100 UNIT/ML ~~LOC~~ SOLN: 0.0000 [IU] | Freq: Every day | SUBCUTANEOUS | Status: DC

## 2016-08-25 MED ORDER — DOCUSATE SODIUM 50 MG/5ML PO LIQD
50.0000 mg | Freq: Two times a day (BID) | ORAL | Status: DC
  Administered 2016-08-25 – 2016-08-28 (×4): 50 mg via ORAL
  Filled 2016-08-25 (×9): qty 10

## 2016-08-25 NOTE — Progress Notes (Signed)
[address] [facility] [place] [name]    MR#:  [id number]  DATE OF BIRTH:  February 11, 1928  SUBJECTIVE:  CHIEF COMPLAINT:   Chief Complaint  Patient presents with  . Shortness of Breath  . Weakness  The patient is 83 year old male with medical history significant for history of aortic stenosis status post bioprosthetic replacement, coronary artery disease, coronary artery bypass grafting, diastolic CHF, stroke, diabetes, hypertension, hyperlipidemia, also arthritis, atrial fibrillation, who presented to the hospital with complaints of shortness of breath. He admitted of orthopnea, dry cough, 4 pound weight gain in the past 24 hours. He noted lower extremity edema. Chest x-ray revealed CHF and he was admitted to the hospital for further evaluation and treatment. He feels somewhat better now  Review of Systems  Constitutional: Negative for chills, fever and weight loss.  HENT: Negative for congestion.   Eyes: Negative for blurred vision and double vision.  Respiratory: Positive for cough, shortness of breath and wheezing. Negative for sputum production.   Cardiovascular: Positive for leg swelling. Negative for chest pain, palpitations, orthopnea and PND.  Gastrointestinal: Negative for abdominal pain, blood in stool, constipation, diarrhea, nausea and vomiting.  Genitourinary: Negative for dysuria, frequency, hematuria and urgency.  Musculoskeletal: Negative for falls.  Neurological: Negative for dizziness, tremors, focal weakness and headaches.  Endo/Heme/Allergies: Does not bruise/bleed easily.  Psychiatric/Behavioral: Negative for depression. The patient does not have insomnia.     VITAL SIGNS: Blood pressure (!) 137/53, pulse 71, temperature 98 F (36.7 C), temperature source Oral, resp. rate 20, height 5\' 10"  (1.778 m), weight 108.6 kg (239 lb 6.4 oz), SpO2 93 %.  PHYSICAL EXAMINATION:   GENERAL:  83 y.o.-year-old patient lying  in the bed in mild-to-moderate respiratory distress, intermittently tachypneic, uncomfortable.  EYES: Pupils equal, round, reactive to light and accommodation. No scleral icterus. Extraocular muscles intact.  HEENT: Head atraumatic, normocephalic. Oropharynx and nasopharynx clear.  NECK:  Supple, no jugular venous distention. No thyroid enlargement, no tenderness.  LUNGS: Some diminished breath sounds bilaterally, no wheezing, rales,rhonchi , but crepitations at bases. Intermittent use of accessory muscles of respiration, especially on exertion.  CARDIOVASCULAR: S1, S2 , irregularly irregular. . No murmurs, rubs, or gallops.  ABDOMEN: Soft, nontender, nondistended. Bowel sounds present. No organomegaly or mass.  EXTREMITIES: No pedal edema, cyanosis, or clubbing.  NEUROLOGIC: Cranial nerves II through XII are intact. Muscle strength 5/5 in all extremities. Sensation intact. Gait not checked.  PSYCHIATRIC: The patient is alert and oriented x 3.  SKIN: No obvious rash, lesion, or ulcer.   ORDERS/RESULTS REVIEWED:   CBC  Recent Labs Lab 08/20/16 1431 08/24/16 1708  WBC  --  6.0  HGB 8.9* 9.2*  HCT  --  27.2*  PLT  --  234  MCV  --  83.7  MCH  --  28.2  MCHC  --  33.7  RDW  --  16.5*  LYMPHSABS  --  0.5*  MONOABS  --  0.8  EOSABS  --  0.5  BASOSABS  --  0.1   ------------------------------------------------------------------------------------------------------------------  Chemistries   Recent Labs Lab 08/24/16 1708 08/25/16 0426  NA 129* 134*  K 4.3 3.6  CL 94* 97*  CO2 27 29  GLUCOSE 207* 100*  BUN 53* 56*  CREATININE 2.61* 2.49*  CALCIUM 8.6* 8.7*   ------------------------------------------------------------------------------------------------------------------ estimated creatinine clearance is 25.8 mL/min (A) (by C-G formula based on SCr of 2.49 mg/dL  (H)). ------------------------------------------------------------------------------------------------------------------  Recent Labs  08/24/16 1708  TSH 4.713*    Cardiac Enzymes  Recent Labs Lab 08/24/16 1708  TROPONINI 0.03*   ------------------------------------------------------------------------------------------------------------------ Invalid input(s): POCBNP ---------------------------------------------------------------------------------------------------------------  RADIOLOGY: Dg Chest 2 View  Result Date: 08/24/2016 CLINICAL DATA:  Shortness of breath and weakness. EXAM: CHEST  2 VIEW COMPARISON:  08/16/2016 FINDINGS: Stable postsurgical changes from CABG and valvular replacement. Cardiomediastinal silhouette is normal. Mediastinal contours appear intact. There is no evidence of focal pneumothorax. Bilateral interstitial prominence likely represents interstitial pulmonary edema. There are bilateral small pleural effusions. Osseous structures are without acute abnormality. Soft tissues are grossly normal. IMPRESSION: Interstitial pulmonary edema with bilateral small pleural effusions. Electronically Signed   By: Fidela Salisbury M.D.   On: 08/24/2016 18:04    EKG:  Orders placed or performed during the hospital encounter of 08/24/16  . EKG 12-Lead  . EKG 12-Lead    ASSESSMENT AND PLAN:  Active Problems:   Acute on chronic diastolic congestive heart failure ([place])  #1. Acute on chronic diastolic CHF, continue Lasix 40 mg intravenously twice a day, following in's and outs, weight, patient diuresed about 2.8 L since admission and feels somewhat better, continue oxygen therapy if needed. The patient was seen by cardiologist, who recommended to continue Coreg #2. Hyponatremia in  fluid overloaded patient, improved with diuretics #3. Chronic renal failure, CK D stage IV, will follow with diuresis, seems to be stable at present #4. Elevated troponin, follow closely,  likely demand ischemia, continue aspirin, Coreg, Imdur, needs statin, was felt to be due to demand ischemia, as well as chronic renal disease #5. Permanent atrial fibrillation, rate controlled on Coreg, patient did not tolerate oral anticoagulants due to GI bleed, continue aspirin therapy    Management plans discussed with the patient, family and they are in agreement.   DRUG ALLERGIES:  Allergies  Allergen Reactions  . Asa [Aspirin] Other (See Comments)    Reaction:  Caused stomach ulcer patient can take 81 mg.  . Losartan Other (See Comments)    Decreased pulse rate  . Metoprolol Other (See Comments)    Reaction:  Bradycardia   . Penicillins Swelling and Other (See Comments)    Has patient had a PCN reaction causing immediate rash, facial/tongue/throat swelling, SOB or lightheadedness with hypotension: No Has patient had a PCN reaction causing severe rash involving mucus membranes or skin necrosis: No Has patient had a PCN reaction that required hospitalization No Has patient had a PCN reaction occurring within the last 10 years: No If all of the above answers are "NO", then may proceed with Cephalosporin use.    CODE STATUS:     Code Status Orders        Start     Ordered   08/24/16 2155  Full code  Continuous     08/24/16 2154    Code Status History    Date Active Date Inactive Code Status Order ID Comments User Context   08/14/2016  7:22 PM 08/17/2016  2:19 PM Full Code [id number]  Fritzi Mandes, MD Inpatient   07/16/2016 11:39 PM 07/19/2016  3:53 PM Full Code [id number]  Harvie Bridge, DO Inpatient   06/19/2016  7:35 PM 06/21/2016  7:35 PM Partial Code [id number]  Theodoro Grist, MD ED   06/01/2016  6:21 PM 06/04/2016  6:52 PM Full Code [id number]  Shon Millet, DO Inpatient   05/08/2016  8:27 AM 05/16/2016  4:41 PM DNR [id number]  Everrett Coombe, MD Inpatient   05/02/2016  2:18 PM 05/08/2016  8:27 AM  Full Code [id number]  Steve Rattler, DO Inpatient   05/02/2016  1:22 PM  05/02/2016  2:18 PM Partial Code [id number]  Elberta Leatherwood, MD ED   05/02/2016  1:12 PM 05/02/2016  1:22 PM DNR [id number]  Merrily Pew, MD ED   03/22/2016  3:48 PM 03/24/2016  4:45 PM Full Code [id number]  Loletha Grayer, MD ED   09/19/2015  7:27 PM 09/21/2015  5:47 PM Full Code [id number]  Lytle Butte, MD ED   07/28/2015  6:39 PM 07/31/2015  4:57 PM Full Code [id number]  [name] Costa, MD ED   07/10/2015 11:37 PM 07/12/2015 11:15 PM Full Code [id number]  Rise Patience, MD Inpatient   04/12/2015  7:05 PM 04/17/2015  7:10 PM Full Code [id number]  Geradine Girt, DO Inpatient   06/29/2014 12:24 PM 07/09/2014  2:15 PM Full Code [id number]  Bary Leriche, PA-C Inpatient    Advance Directive Documentation     Most Recent Value  Type of Advance Directive  Healthcare Power of Attorney  Pre-existing out of facility DNR order (yellow form or pink MOST form)  -  "MOST" Form in Place?  -      TOTAL TIME TAKING CARE OF THIS PATIENT: 40 minutes.    Theodoro Grist M.D on 08/25/2016 at 2:13 PM  Between 7am to 6pm - Pager - [phone]  After 6pm go to [email or web address] - password [email or web address]  Office  [phone]  CC: Primary care physician; [name], MD
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
## 2016-08-25 NOTE — Plan of Care (Signed)
Problem: Education: Goal: Ability to demonstrate managment of disease process will improve Outcome: Progressing Although he weighs himself daily he doesn't not know what actions he should take regarding his weight.  Reports 8 lb weight gain in last 3 weeks.  Guidelines explained.  2-3 pound gain overnight or 4-5 pound gain over a week necessitates a call to his cardiologist.

## 2016-08-26 ENCOUNTER — Inpatient Hospital Stay (HOSPITAL_COMMUNITY)
Admit: 2016-08-26 | Discharge: 2016-08-26 | Disposition: A | Discharge status: Home / Self Care | Payer: Medicare Other | Attending: Family Medicine | Admitting: Family Medicine

## 2016-08-26 DIAGNOSIS — I5031 Acute diastolic (congestive) heart failure: Secondary | ICD-10-CM

## 2016-08-26 LAB — ECHOCARDIOGRAM COMPLETE
Height: 70 in
WEIGHTICAEL: 3795.2 [oz_av]

## 2016-08-26 LAB — GLUCOSE, CAPILLARY
GLUCOSE-CAPILLARY: 145 mg/dL — AB (ref 65–99)
GLUCOSE-CAPILLARY: 159 mg/dL — AB (ref 65–99)
GLUCOSE-CAPILLARY: 160 mg/dL — AB (ref 65–99)
GLUCOSE-CAPILLARY: 70 mg/dL (ref 65–99)
Glucose-Capillary: 127 mg/dL — ABNORMAL HIGH (ref 65–99)
Glucose-Capillary: 256 mg/dL — ABNORMAL HIGH (ref 65–99)
Glucose-Capillary: 63 mg/dL — ABNORMAL LOW (ref 65–99)
Glucose-Capillary: 88 mg/dL (ref 65–99)
Glucose-Capillary: 126 mg/dL — ABNORMAL HIGH (ref 65–99)
Glucose-Capillary: 170 mg/dL — ABNORMAL HIGH (ref 65–99)

## 2016-08-26 LAB — BASIC METABOLIC PANEL
Anion gap: 10 (ref 5–15)
BUN: 57 mg/dL — AB (ref 6–20)
CALCIUM: 8.9 mg/dL (ref 8.9–10.3)
CHLORIDE: 94 mmol/L — AB (ref 101–111)
CO2: 29 mmol/L (ref 22–32)
CREATININE: 2.36 mg/dL — AB (ref 0.61–1.24)
GFR calc Af Amer: 27 mL/min — ABNORMAL LOW (ref >60)
GFR calc non Af Amer: 23 mL/min — ABNORMAL LOW (ref >60)
GLUCOSE: 133 mg/dL — AB (ref 65–99)
Potassium: 4.1 mmol/L (ref 3.5–5.1)
Sodium: 133 mmol/L — ABNORMAL LOW (ref 135–145)

## 2016-08-26 LAB — CBC
HCT: 26.9 % — ABNORMAL LOW (ref 40.0–52.0)
Hemoglobin: 8.9 g/dL — ABNORMAL LOW (ref 13.0–18.0)
MCH: 27.7 pg (ref 26.0–34.0)
MCHC: 33.1 g/dL (ref 32.0–36.0)
MCV: 83.5 fL (ref 80.0–100.0)
PLATELETS: 247 10*3/uL (ref 150–440)
RBC: 3.22 MIL/uL — AB (ref 4.40–5.90)
RDW: 16.4 % — AB (ref 11.5–14.5)
WBC: 6.3 10*3/uL (ref 3.8–10.6)

## 2016-08-26 LAB — T4, FREE: FREE T4: 0.96 ng/dL (ref 0.61–1.12)

## 2016-08-26 LAB — TROPONIN I: Troponin I: <0.03 ng/mL (ref <0.03)

## 2016-08-26 MED ORDER — FUROSEMIDE 10 MG/ML IJ SOLN
60.0000 mg | Freq: Two times a day (BID) | INTRAMUSCULAR | Status: DC
  Administered 2016-08-26 – 2016-08-27 (×3): 60 mg via INTRAVENOUS
  Filled 2016-08-26 (×3): qty 6

## 2016-08-26 MED ORDER — INSULIN GLARGINE 100 UNIT/ML ~~LOC~~ SOLN
30.0000 [IU] | Freq: Every day | SUBCUTANEOUS | Status: DC
  Administered 2016-08-27 – 2016-08-29 (×3): 30 [IU] via SUBCUTANEOUS
  Filled 2016-08-26 (×3): qty 0.3

## 2016-08-26 MED ORDER — ATORVASTATIN CALCIUM 20 MG PO TABS
40.0000 mg | ORAL_TABLET | Freq: Every day | ORAL | Status: DC
  Administered 2016-08-26 – 2016-08-28 (×3): 40 mg via ORAL
  Filled 2016-08-26 (×3): qty 2

## 2016-08-26 MED ORDER — INSULIN ASPART 100 UNIT/ML ~~LOC~~ SOLN
3.0000 [IU] | Freq: Three times a day (TID) | SUBCUTANEOUS | Status: DC
  Administered 2016-08-27 – 2016-08-29 (×8): 3 [IU] via SUBCUTANEOUS
  Filled 2016-08-26 (×8): qty 3

## 2016-08-26 MED ORDER — INSULIN ASPART 100 UNIT/ML ~~LOC~~ SOLN
0.0000 [IU] | Freq: Three times a day (TID) | SUBCUTANEOUS | Status: DC
  Administered 2016-08-27 (×2): 7 [IU] via SUBCUTANEOUS
  Administered 2016-08-28: 3 [IU] via SUBCUTANEOUS
  Administered 2016-08-28 – 2016-08-29 (×2): 7 [IU] via SUBCUTANEOUS
  Administered 2016-08-29: 9 [IU] via SUBCUTANEOUS
  Filled 2016-08-26: qty 9
  Filled 2016-08-26 (×4): qty 7
  Filled 2016-08-26: qty 5

## 2016-08-26 NOTE — Care Management Important Message (Signed)
Important Message  Patient Details  [name] MRN: [id number] Date of Birth: 1927/10/15   Medicare Important Message Given:  Yes  Initial signed IM printed from Epic and given to patient.     Katrina Stack, RN 08/26/2016, 1:42 PM

## 2016-08-26 NOTE — Care Management (Addendum)
Have requested approval for [place] . Patient is not requiring oxygen.  Weighs everyday and  CM can not identify any areas concerning any issues regarding compliance.

## 2016-08-26 NOTE — Discharge Instructions (Signed)
Heart Failure Clinic appointment on September 05, 2016 at 10:40am with Darylene Price, [place]. Please call [phone] to reschedule.

## 2016-08-26 NOTE — Progress Notes (Signed)
[facility]  Patient Status: Active  [facility] is providing the following services: SN/PT  If patient discharges after hours, please call [phone].   [name] 08/26/2016, 8:53 AM

## 2016-08-26 NOTE — Progress Notes (Signed)
Hypoglycemic Event  CBG: 64  Treatment: Symptoms: none  Follow-up CBG: Time:1709 CBG Result:70  Possible Reasons for Event: unsure, adjustments made to sliding scale and lantus  Comments/MD notified:MD notified. Sliding scale changed to sensitive, Meal coverage decreased to 3 units, Lantus decreased to 30 units.     [name] D [name]

## 2016-08-26 NOTE — Telephone Encounter (Signed)
Patient admitted back to [facility].

## 2016-08-26 NOTE — Care Management (Signed)
Presents from home with exac CHF.  Currently followed by [facility].  Reaching out to agency to determine services and whether [facility]

## 2016-08-26 NOTE — Progress Notes (Signed)
*  PRELIMINARY RESULTS* Echocardiogram 2D Echocardiogram has been performed.  Sherrie Sport 08/26/2016, 8:52 AM

## 2016-08-26 NOTE — Progress Notes (Signed)
Patients BG was 63 at 1647, 1 (4oz) juice given. BG recheck at 12. Another 4oz juice given, BG rechecked at 88. Md notified. Order to change sliding scale to sensitive, change to 3 units for mealtime coverage and decrease to 30 units lantus insulin daily.

## 2016-08-26 NOTE — Progress Notes (Signed)
Patient [name] Date of Encounter: 08/26/2016  Primary Cardiologist: [name] Problem List     Active Problems:   Acute on chronic diastolic congestive heart failure (HCC)     Subjective   SOB much improved. States he is breathing "great." UOP 2.8 L for the past 24 hours and 4.7 L for the admission. Weight 237 pounds this morning on bed scale (discharge weight on 3/10 of 238 pounds). Poor appetite persists. Renal function improving with IV diuresis. CBC with stable renal function. No albumin.   Inpatient Medications    Scheduled Meds: . aspirin EC  81 mg Oral Daily  . calcitRIOL  0.25 mcg Oral Once per day on Mon Wed Fri  . carvedilol  6.25 mg Oral BID  . cholecalciferol  1,000 Units Oral Daily  . docusate  50 mg Oral BID  . escitalopram  10 mg Oral Daily  . ferrous sulfate  325 mg Oral BID WC  . furosemide  40 mg Intravenous BID  . heparin  5,000 Units Subcutaneous Q8H  . hydroxychloroquine  200 mg Oral Daily  . insulin aspart  0-15 Units Subcutaneous TID WC  . insulin aspart  0-5 Units Subcutaneous QHS  . insulin aspart  4 Units Subcutaneous TID WC  . insulin glargine  35 Units Subcutaneous Daily  . isosorbide mononitrate  30 mg Oral Daily  . ketotifen  1 drop Right Eye BID  . loratadine  10 mg Oral Daily  . multivitamin with minerals  1 tablet Oral Daily  . multivitamin-lutein  1 capsule Oral BID  . pantoprazole  40 mg Oral Daily  . sodium chloride flush  3 mL Intravenous Q12H  . tamsulosin  0.4 mg Oral Daily  . vitamin C  500 mg Oral Daily   Continuous Infusions:  PRN Meds: sodium chloride, acetaminophen, ALPRAZolam, HYDROcodone-acetaminophen, ondansetron (ZOFRAN) IV, sodium chloride flush   Vital Signs    Vitals:   08/25/16 1108 08/25/16 1913 08/26/16 0359 08/26/16 0745  BP: (!) 137/53 (!) 149/62 (!) 144/71 (!) 148/50  Pulse: 71 71 71 66  Resp: 20 18 18 16   Temp: 98 F (36.7 C) 97.7 F (36.5 C) 98.1 F (36.7 C) 98.1 F (36.7  C)  TempSrc: Oral Oral Oral Oral  SpO2: 93% 98% 94% 94%  Weight:   237 lb 3.2 oz (107.6 kg)   Height:        Intake/Output Summary (Last 24 hours) at 08/26/16 0856 Last data filed at 08/26/16 9798  Gross per 24 hour  Intake              360 ml  Output             3250 ml  Net            -2890 ml   Filed Weights   08/25/16 0335 08/25/16 0948 08/26/16 0359  Weight: 234 lb 6.4 oz (106.3 kg) 239 lb 6.4 oz (108.6 kg) 237 lb 3.2 oz (107.6 kg)    Physical Exam    GEN: Well nourished, well developed, in no acute distress.  HEENT: Grossly normal.  Neck: Supple, JVD elevated ~ 8 cm, no carotid bruits, or masses. Cardiac: Irregularly irregular, no murmurs, rubs, or gallops. No clubbing, cyanosis, 1+ LE edema bilaterally.  Radials/DP/PT 2+ and equal bilaterally.  Respiratory:  Respirations regular and unlabored, clear to auscultation bilaterally. GI: Soft, nontender, distended, BS + x 4. MS: no deformity or atrophy. Skin: warm and dry,  no rash. Neuro:  Strength and sensation are intact. Psych: AAOx3.  Normal affect.  Labs    CBC  Recent Labs  08/24/16 1708 08/26/16 0421  WBC 6.0 6.3  NEUTROABS 4.1  --   HGB 9.2* 8.9*  HCT 27.2* 26.9*  MCV 83.7 83.5  PLT 234 970   Basic Metabolic Panel  Recent Labs  08/25/16 0426 08/26/16 0418  NA 134* 133*  K 3.6 4.1  CL 97* 94*  CO2 29 29  GLUCOSE 100* 133*  BUN 56* 57*  CREATININE 2.49* 2.36*  CALCIUM 8.7* 8.9   Liver Function Tests No results for input(s): AST, ALT, ALKPHOS, BILITOT, PROT, ALBUMIN in the last 72 hours. No results for input(s): LIPASE, AMYLASE in the last 72 hours. Cardiac Enzymes  Recent Labs  08/24/16 1708 08/26/16 0418  TROPONINI 0.03* <0.03   BNP Invalid input(s): POCBNP D-Dimer No results for input(s): DDIMER in the last 72 hours. Hemoglobin A1C No results for input(s): HGBA1C in the last 72 hours. Fasting Lipid Panel No results for input(s): CHOL, HDL, LDLCALC, TRIG, CHOLHDL, LDLDIRECT in  the last 72 hours. Thyroid Function Tests  Recent Labs  08/24/16 1708  TSH 4.713*    Telemetry    Afib, 70s to 80s bpm - Personally Reviewed  ECG    n/a - Personally Reviewed  Radiology    Dg Chest 2 View  Result Date: 08/24/2016 CLINICAL DATA:  Shortness of breath and weakness. EXAM: CHEST  2 VIEW COMPARISON:  08/16/2016 FINDINGS: Stable postsurgical changes from CABG and valvular replacement. Cardiomediastinal silhouette is normal. Mediastinal contours appear intact. There is no evidence of focal pneumothorax. Bilateral interstitial prominence likely represents interstitial pulmonary edema. There are bilateral small pleural effusions. Osseous structures are without acute abnormality. Soft tissues are grossly normal. IMPRESSION: Interstitial pulmonary edema with bilateral small pleural effusions. Electronically Signed   By: Fidela Salisbury M.D.   On: 08/24/2016 18:04    Cardiac Studies   TTE this admission pending   TTE from 03/2016: Study Conclusions  - Left ventricle: The cavity size was normal. There was mild   concentric hypertrophy. Systolic function was normal. The   estimated ejection fraction was in the range of 55% to 60%. Wall   motion was normal; there were no regional wall motion   abnormalities. The study is not technically sufficient to allow   evaluation of LV diastolic function. - Aortic valve: A bioprosthesis was present. Transvalvular velocity   was within the normal range for prosthetic valve Peak velocity   (S): 241 cm/s. Mean gradient (S): 14 mm Hg. Peak gradient (S): 23   mm Hg. - Left atrium: The atrium was mild to moderately dilated. - Right ventricle: Systolic function was normal. - Pulmonary arteries: Systolic pressure could not be accurately   estimated. - Inferior vena cava: The vessel was dilated. The respirophasic   diameter changes were blunted (< 50%), consistent with elevated   central venous pressure.  Impressions:  -  Rhythm is atrial fibrillation.  Recommendations:  Challenging image quality.  Patient Profile     83 y.o. male with history of CAD s/p CABG x 1 in 07/2007, chronic Afib s/p Watchman device in 08/2015 previously on Eliquis with GI bleed noted in the setting of AV malformations, severe aortic stenosis s/p bioprosthetic AVR, chronic diastolic CHF, stroke, and bladder cancer, frequent hospital admission lately (most recently 3/7-3/10 for same issue) who returned to [facility] with progressive SOB and decreased appetite.   Assessment & Plan  1. Acute on chronic diastolic CHF: -Has diuresed well on IV Lasix 40 mg bid, continue with KCL repletion -Renal function improving with diuresis -Reports he is not certain what diuretic he was taking at home that states it was a 40 mg tab (should be on torsemide 40 mg bid), was taking daily -Echo pending -Daily standing weights -Strict I's and O's -Coreg  2. Permanent Afib s/p Watchman: -Currently rate controlled -Coreg -ASA 81 mg daily  3. CAD s/p CABG x 1 in 2009: -No symptoms concerning for angina -Minimal troponin elevated of 0.03 that down trended -Likely supply demand ischemia 2/2 volume overload, anemia, CKD -Echo pending as above -No plans for inpatient ischemic evaluation at this time  4. Decreased appetite: -Check albumin level, this may be contributing to some of his swelling 2/2 third spacing   5. Anemia: -HGB stable this morning -Monitor -He typically is less symptomatic with HGB > 9 to 9.5  6. CKD stage IV: -Renal function improving with diuresis -Monitor    Signed, Christell Faith, PA-C [facility] Pager: [phone] 08/26/2016, 8:56 AM

## 2016-08-26 NOTE — Progress Notes (Signed)
[place] [facility] [place] [name]    MR#:  [id number]  DATE OF BIRTH:  [birth year]  SUBJECTIVE:  CHIEF COMPLAINT:   Chief Complaint  Patient presents with  . Shortness of Breath  . Weakness  The patient is 83 year old male with medical history significant for history of aortic stenosis status post bioprosthetic replacement, coronary artery disease, coronary artery bypass grafting, diastolic CHF, stroke, diabetes, hypertension, hyperlipidemia, also arthritis, atrial fibrillation, who presented to the hospital with complaints of shortness of breath. He admitted of orthopnea, dry cough, 4 pound weight gain in the past 24 hours. He noted lower extremity edema. Chest x-ray revealed CHF and he was admitted to the hospital for further evaluation and treatment. He feels better Today after diuresis ofabout 4.5 L since admission. He still remains fluid overloaded, his diuretic is advanced to 60 mg twice daily dose per cardiologist. Echocardiogram revealed normal functioningaortic valve prosthesis, severe pulmonary hypertension  Review of Systems  Constitutional: Negative for chills, fever and weight loss.  HENT: Negative for congestion.   Eyes: Negative for blurred vision and double vision.  Respiratory: Positive for shortness of breath. Negative for sputum production.   Cardiovascular: Positive for leg swelling. Negative for chest pain, palpitations, orthopnea and PND.  Gastrointestinal: Negative for abdominal pain, blood in stool, constipation, diarrhea, nausea and vomiting.  Genitourinary: Negative for dysuria, frequency, hematuria and urgency.  Musculoskeletal: Negative for falls.  Neurological: Negative for dizziness, tremors, focal weakness and headaches.  Endo/Heme/Allergies: Does not bruise/bleed easily.  Psychiatric/Behavioral: Negative for depression. The patient does not have insomnia.     VITAL SIGNS: Blood pressure (!)  125/43, pulse 61, temperature 97.7 F (36.5 C), temperature source Oral, resp. rate 16, height 5\' 10"  (1.778 m), weight 107.6 kg (237 lb 3.2 oz), SpO2 93 %.  PHYSICAL EXAMINATION:   GENERAL:  83 y.o.-year-old patient lying in the bed in mild respiratory distress, intermittently tachypneic, , but overall more comfortable today.  EYES: Pupils equal, round, reactive to light and accommodation. No scleral icterus. Extraocular muscles intact.  HEENT: Head atraumatic, normocephalic. Oropharynx and nasopharynx clear.  NECK:  Supple, no jugular venous distention. No thyroid enlargement, no tenderness.  LUNGS: Some diminished breath sounds bilaterally, no wheezing, rales,rhonchi , few crepitations at bases. Intermittent use of accessory muscles of respiration,  on exertion.  CARDIOVASCULAR: S1, S2 , irregularly irregular. . No murmurs, rubs, or gallops.  ABDOMEN: Soft, nontender, nondistended. Bowel sounds present. No organomegaly or mass. Abdominal wall edema EXTREMITIES: 2+ lower extremity and pedal edema, no cyanosis, or clubbing.  NEUROLOGIC: Cranial nerves II through XII are intact. Muscle strength 5/5 in all extremities. Sensation intact. Gait not checked.  PSYCHIATRIC: The patient is alert and oriented x 3.  SKIN: No obvious rash, lesion, or ulcer.   ORDERS/RESULTS REVIEWED:   CBC  Recent Labs Lab 08/20/16 1431 08/24/16 1708 08/26/16 0421  WBC  --  6.0 6.3  HGB 8.9* 9.2* 8.9*  HCT  --  27.2* 26.9*  PLT  --  234 247  MCV  --  83.7 83.5  MCH  --  28.2 27.7  MCHC  --  33.7 33.1  RDW  --  16.5* 16.4*  LYMPHSABS  --  0.5*  --   MONOABS  --  0.8  --   EOSABS  --  0.5  --   BASOSABS  --  0.1  --    ------------------------------------------------------------------------------------------------------------------  Chemistries   Recent Labs Lab  08/24/16 1708 08/25/16 0426 08/26/16 0418  NA 129* 134* 133*  K 4.3 3.6 4.1  CL 94* 97* 94*  CO2 27 29 29   GLUCOSE 207* 100* 133*    BUN 53* 56* 57*  CREATININE 2.61* 2.49* 2.36*  CALCIUM 8.6* 8.7* 8.9   ------------------------------------------------------------------------------------------------------------------ estimated creatinine clearance is 27.1 mL/min (A) (by C-G formula based on SCr of 2.36 mg/dL (H)). ------------------------------------------------------------------------------------------------------------------  Recent Labs  08/24/16 1708  TSH 4.713*    Cardiac Enzymes  Recent Labs Lab 08/24/16 1708 08/26/16 0418  TROPONINI 0.03* <0.03   ------------------------------------------------------------------------------------------------------------------ Invalid input(s): POCBNP ---------------------------------------------------------------------------------------------------------------  RADIOLOGY: Dg Chest 2 View  Result Date: 08/24/2016 CLINICAL DATA:  Shortness of breath and weakness. EXAM: CHEST  2 VIEW COMPARISON:  08/16/2016 FINDINGS: Stable postsurgical changes from CABG and valvular replacement. Cardiomediastinal silhouette is normal. Mediastinal contours appear intact. There is no evidence of focal pneumothorax. Bilateral interstitial prominence likely represents interstitial pulmonary edema. There are bilateral small pleural effusions. Osseous structures are without acute abnormality. Soft tissues are grossly normal. IMPRESSION: Interstitial pulmonary edema with bilateral small pleural effusions. Electronically Signed   By: Fidela Salisbury M.D.   On: 08/24/2016 18:04    EKG:  Orders placed or performed during the hospital encounter of 08/24/16  . EKG 12-Lead  . EKG 12-Lead  . EKG 12-Lead  . EKG 12-Lead    ASSESSMENT AND PLAN:  Active Problems:   Acute on chronic diastolic congestive heart failure ([place])  #1. Acute on chronic diastolic CHF, continue Lasix 60 mg intravenously twice a day, following in's and outs, weight, patient diuresed about open 5 L since admission and  feels somewhat better, now on room air. The patient was seen by cardiologist, who recommended to continue Coreg, advanced Lasix to 60 mg twice daily dose. Echocardiogram revealed normal ejection fraction, normal functioning prosthetic aortic valve, marked pulmonary hypertension at 70 mmHg #2. Hyponatremia in  fluid overloaded patient, improved initially with diuretics, but now it's worse, follow in the morning #3. Chronic renal failure, CK D stage IV, relatively stable with diuresis #4. Elevated troponin, due to demand ischemia, continue aspirin, Coreg, Imdur, statin, no chest pains reported #5. Permanent atrial fibrillation, rate controlled on Coreg, patient did not tolerate oral anticoagulants due to GI bleed, continue aspirin therapy #6. Generalized weakness, getting physical therapist and will follow recommendations   Management plans discussed with the patient, family and they are in agreement.   DRUG ALLERGIES:  Allergies  Allergen Reactions  . Asa [Aspirin] Other (See Comments)    Reaction:  Caused stomach ulcer patient can take 81 mg.  . Losartan Other (See Comments)    Decreased pulse rate  . Metoprolol Other (See Comments)    Reaction:  Bradycardia   . Penicillins Swelling and Other (See Comments)    Has patient had a PCN reaction causing immediate rash, facial/tongue/throat swelling, SOB or lightheadedness with hypotension: No Has patient had a PCN reaction causing severe rash involving mucus membranes or skin necrosis: No Has patient had a PCN reaction that required hospitalization No Has patient had a PCN reaction occurring within the last 10 years: No If all of the above answers are "NO", then may proceed with Cephalosporin use.    CODE STATUS:     Code Status Orders        Start     Ordered   08/24/16 2155  Full code  Continuous     08/24/16 2154    Code Status History    Date Active  Date Inactive Code Status Order ID Comments User Context   08/14/2016  7:22 PM  08/17/2016  2:19 PM Full Code [id number]  Fritzi Mandes, MD Inpatient   07/16/2016 11:39 PM 07/19/2016  3:53 PM Full Code [id number]  Harvie Bridge, DO Inpatient   06/19/2016  7:35 PM 06/21/2016  7:35 PM Partial Code [id number]  Theodoro Grist, MD ED   06/01/2016  6:21 PM 06/04/2016  6:52 PM Full Code [id number]  Shon Millet, DO Inpatient   05/08/2016  8:27 AM 05/16/2016  4:41 PM DNR [id number]  Everrett Coombe, MD Inpatient   05/02/2016  2:18 PM 05/08/2016  8:27 AM Full Code [id number]  Steve Rattler, DO Inpatient   05/02/2016  1:22 PM 05/02/2016  2:18 PM Partial Code [id number]  Elberta Leatherwood, MD ED   05/02/2016  1:12 PM 05/02/2016  1:22 PM DNR [id number]  Merrily Pew, MD ED   03/22/2016  3:48 PM 03/24/2016  4:45 PM Full Code [id number]  Loletha Grayer, MD ED   09/19/2015  7:27 PM 09/21/2015  5:47 PM Full Code [id number]  Lytle Butte, MD ED   07/28/2015  6:39 PM 07/31/2015  4:57 PM Full Code [id number]  [name] Costa, MD ED   07/10/2015 11:37 PM 07/12/2015 11:15 PM Full Code [id number]  Rise Patience, MD Inpatient   04/12/2015  7:05 PM 04/17/2015  7:10 PM Full Code [id number]  Geradine Girt, DO Inpatient   06/29/2014 12:24 PM 07/09/2014  2:15 PM Full Code [id number]  Bary Leriche, PA-C Inpatient    Advance Directive Documentation     Most Recent Value  Type of Advance Directive  Healthcare Power of Attorney  Pre-existing out of facility DNR order (yellow form or pink MOST form)  -  "MOST" Form in Place?  -      TOTAL TIME TAKING CARE OF THIS PATIENT: 35 minutes.    Theodoro Grist M.D on 08/26/2016 at 3:12 PM  Between 7am to 6pm - Pager - [phone]  After 6pm go to [email or web address] - password [email or web address] [facility]  Office  [phone]  CC: Primary care physician; [name], MD

## 2016-08-27 DIAGNOSIS — I25118 Atherosclerotic heart disease of native coronary artery with other forms of angina pectoris: Secondary | ICD-10-CM

## 2016-08-27 DIAGNOSIS — N183 Chronic kidney disease, stage 3 (moderate): Secondary | ICD-10-CM

## 2016-08-27 DIAGNOSIS — D649 Anemia, unspecified: Secondary | ICD-10-CM

## 2016-08-27 LAB — BASIC METABOLIC PANEL
Anion gap: 8 (ref 5–15)
BUN: 51 mg/dL — AB (ref 6–20)
CHLORIDE: 94 mmol/L — AB (ref 101–111)
CO2: 31 mmol/L (ref 22–32)
CREATININE: 2.12 mg/dL — AB (ref 0.61–1.24)
Calcium: 8.7 mg/dL — ABNORMAL LOW (ref 8.9–10.3)
GFR calc Af Amer: 31 mL/min — ABNORMAL LOW (ref >60)
GFR calc non Af Amer: 26 mL/min — ABNORMAL LOW (ref >60)
GLUCOSE: 172 mg/dL — AB (ref 65–99)
Potassium: 4.2 mmol/L (ref 3.5–5.1)
SODIUM: 133 mmol/L — AB (ref 135–145)

## 2016-08-27 LAB — HEMOGLOBIN A1C
Hgb A1c MFr Bld: 6.6 % — ABNORMAL HIGH (ref 4.8–5.6)
Mean Plasma Glucose: 143 mg/dL

## 2016-08-27 LAB — GLUCOSE, CAPILLARY
GLUCOSE-CAPILLARY: 314 mg/dL — AB (ref 65–99)
GLUCOSE-CAPILLARY: 302 mg/dL — AB (ref 65–99)
GLUCOSE-CAPILLARY: 113 mg/dL — AB (ref 65–99)
Glucose-Capillary: 351 mg/dL — ABNORMAL HIGH (ref 65–99)
Glucose-Capillary: 131 mg/dL — ABNORMAL HIGH (ref 65–99)

## 2016-08-27 LAB — HEMOGLOBIN: Hemoglobin: 8.7 g/dL — ABNORMAL LOW (ref 13.0–18.0)

## 2016-08-27 MED ORDER — ZOLPIDEM TARTRATE 5 MG PO TABS
5.0000 mg | ORAL_TABLET | Freq: Once | ORAL | Status: AC
  Administered 2016-08-27: 5 mg via ORAL
  Filled 2016-08-27: qty 1

## 2016-08-27 NOTE — Care Management (Signed)
Received approval for [place]. also made [facility] referral.  [facility] has a "lasix protocol" that can be ordered at discharge that could be implemented and maybe prevent readmission

## 2016-08-27 NOTE — Progress Notes (Addendum)
Inpatient Diabetes Program Recommendations  AACE/ADA: New Consensus Statement on Inpatient Glycemic Control (2015)  Target Ranges:  Prepandial:   less than 140 mg/dL      Peak postprandial:   less than 180 mg/dL (1-2 hours)      Critically ill patients:  140 - 180 mg/dL   Lab Results  Component Value Date   GLUCAP 302 (H) 08/27/2016   HGBA1C 6.6 (H) 08/25/2016    Review of Glycemic Control Results for NICANOR, MENDOLIA (MRN [id number]) as of 08/27/2016 14:39  Ref. Range 08/26/2016 20:49 08/26/2016 23:42 08/27/2016 08:33 08/27/2016 11:28 08/27/2016 12:44  Glucose-Capillary Latest Ref Range: 65 - 99 mg/dL 170 (H) 145 (H) 314 (H) 351 (H) 302 (H)   Diabetes history: Type 2 Outpatient Diabetes medications: Novolog 3 units tid, Lantus 35 units qday Current orders for Inpatient glycemic control: Lantus 35 units qday, Novolog 3 units tid, Novolog 0-9 units tid, Novolog 0-5 units qhs  Inpatient Diabetes Program Recommendations:  Patient had a low blood sugar yesterday as a result of having Novolog insulin too close together (0955 and 1232). This resulted in blood sugars being too low at supper so he didn't get Novolog at that time.    Please consider ordering Novolog 0-5 units QHS for bedtime correction.  Gentry Fitz, RN, BA, MHA, CDE Diabetes Coordinator Inpatient Diabetes Program  [phone] (Team Pager) [phone] ([place]) 08/27/2016 2:59 PM

## 2016-08-27 NOTE — Progress Notes (Signed)
Patient [name] Date of Encounter: 08/27/2016  Primary Cardiologist: [facility] Problem List     Active Problems:   Acute on chronic diastolic congestive heart failure (HCC)    Subjective   Feels well this morning other than continues swelling in both legs. No significant shortness of breath. No chest pain or bleeding.  Inpatient Medications    Scheduled Meds: . aspirin EC  81 mg Oral Daily  . atorvastatin  40 mg Oral q1800  . calcitRIOL  0.25 mcg Oral Once per day on Mon Wed Fri  . carvedilol  6.25 mg Oral BID  . cholecalciferol  1,000 Units Oral Daily  . docusate  50 mg Oral BID  . escitalopram  10 mg Oral Daily  . ferrous sulfate  325 mg Oral BID WC  . furosemide  60 mg Intravenous BID  . heparin  5,000 Units Subcutaneous Q8H  . hydroxychloroquine  200 mg Oral Daily  . insulin aspart  0-5 Units Subcutaneous QHS  . insulin aspart  0-9 Units Subcutaneous TID WC  . insulin aspart  3 Units Subcutaneous TID WC  . insulin glargine  30 Units Subcutaneous Daily  . isosorbide mononitrate  30 mg Oral Daily  . ketotifen  1 drop Right Eye BID  . loratadine  10 mg Oral Daily  . multivitamin with minerals  1 tablet Oral Daily  . multivitamin-lutein  1 capsule Oral BID  . pantoprazole  40 mg Oral Daily  . sodium chloride flush  3 mL Intravenous Q12H  . tamsulosin  0.4 mg Oral Daily  . vitamin C  500 mg Oral Daily   Continuous Infusions:  PRN Meds: sodium chloride, acetaminophen, ALPRAZolam, HYDROcodone-acetaminophen, ondansetron (ZOFRAN) IV, sodium chloride flush   Vital Signs    Vitals:   08/26/16 1204 08/26/16 1925 08/27/16 0434 08/27/16 0748  BP: (!) 125/43 (!) 145/57 (!) 138/48 133/60  Pulse: 61 68 68 71  Resp:  17 17 17   Temp: 97.7 F (36.5 C) 98.1 F (36.7 C) 98.2 F (36.8 C) 98.6 F (37 C)  TempSrc: Oral Oral Oral Oral  SpO2: 93% 96% 93% 91%  Weight:   233 lb 4 oz (105.8 kg)   Height:        Intake/Output Summary (Last 24 hours) at  08/27/16 0833 Last data filed at 08/26/16 1721  Gross per 24 hour  Intake              240 ml  Output             1100 ml  Net             -860 ml   Filed Weights   08/25/16 0948 08/26/16 0359 08/27/16 0434  Weight: 239 lb 6.4 oz (108.6 kg) 237 lb 3.2 oz (107.6 kg) 233 lb 4 oz (105.8 kg)    Physical Exam    GEN: Well-developed, well-nourished elderly man, seated comfortably in bed. HEENT: Grossly normal.  Neck: Supple, JVD elevated ~ 8-10 cm with equivocal HJR. Cardiac: Irregularly irregular, 1/6 systolic murmur. No rubs. No clubbing, cyanosis, 1+ LE edema bilaterally.  Radials/DP/PT 2+ and equal bilaterally.  Respiratory:  Mildly diminished breath sounds in the lower lung fields bilaterally. GI: Soft, nontender, distended, BS + x 4. MS: no deformity or atrophy. Skin: warm and dry, no rash. Neuro:  Strength and sensation are intact. Psych: AAOx3.  Normal affect.  Labs    CBC  Recent Labs  08/24/16 1708 08/26/16  0421 08/27/16 0433  WBC 6.0 6.3  --   NEUTROABS 4.1  --   --   HGB 9.2* 8.9* 8.7*  HCT 27.2* 26.9*  --   MCV 83.7 83.5  --   PLT 234 247  --    Basic Metabolic Panel  Recent Labs  08/26/16 0418 08/27/16 0433  NA 133* 133*  K 4.1 4.2  CL 94* 94*  CO2 29 31  GLUCOSE 133* 172*  BUN 57* 51*  CREATININE 2.36* 2.12*  CALCIUM 8.9 8.7*   Liver Function Tests No results for input(s): AST, ALT, ALKPHOS, BILITOT, PROT, ALBUMIN in the last 72 hours. No results for input(s): LIPASE, AMYLASE in the last 72 hours. Cardiac Enzymes  Recent Labs  08/24/16 1708 08/26/16 0418  TROPONINI 0.03* <0.03   BNP Invalid input(s): POCBNP D-Dimer No results for input(s): DDIMER in the last 72 hours. Hemoglobin A1C  Recent Labs  08/25/16 0426  HGBA1C 6.6*   Fasting Lipid Panel No results for input(s): CHOL, HDL, LDLCALC, TRIG, CHOLHDL, LDLDIRECT in the last 72 hours. Thyroid Function Tests  Recent Labs  08/24/16 1708  TSH 4.713*    Telemetry    Atrial  fibrillation with adequate rate control (HR 60-90 bpm) - Personally Reviewed  ECG    n/a - Personally Reviewed  Radiology    No results found.  Cardiac Studies   TTE from 08/26/16 Study Conclusions  - Left ventricle: The cavity size was normal. There was moderate   concentric hypertrophy. Systolic function was normal. The   estimated ejection fraction was in the range of 55% to 60%.   Images were inadequate for LV wall motion assessment. The study   was not technically sufficient to allow evaluation of LV   diastolic dysfunction due to atrial fibrillation. - Aortic valve: A bioprosthesis was present and functioning   normally. Mean gradient (S): 9 mm Hg. Peak gradient (S): 17 mm   Hg. Valve area (VTI): 1.38 cm^2. - Mitral valve: Calcified annulus. - Left atrium: The atrium was moderately dilated. - Right atrium: The atrium was mildly dilated. - Pulmonary arteries: Systolic pressure was severely increased. PA   peak pressure: 70 mm Hg (S). - Inferior vena cava: Not visualized.   TTE from 03/2016: Study Conclusions  - Left ventricle: The cavity size was normal. There was mild   concentric hypertrophy. Systolic function was normal. The   estimated ejection fraction was in the range of 55% to 60%. Wall   motion was normal; there were no regional wall motion   abnormalities. The study is not technically sufficient to allow   evaluation of LV diastolic function. - Aortic valve: A bioprosthesis was present. Transvalvular velocity   was within the normal range for prosthetic valve Peak velocity   (S): 241 cm/s. Mean gradient (S): 14 mm Hg. Peak gradient (S): 23   mm Hg. - Left atrium: The atrium was mild to moderately dilated. - Right ventricle: Systolic function was normal. - Pulmonary arteries: Systolic pressure could not be accurately   estimated. - Inferior vena cava: The vessel was dilated. The respirophasic   diameter changes were blunted (< 50%), consistent with  elevated   central venous pressure.  Impressions:  - Rhythm is atrial fibrillation.  Recommendations:  Challenging image quality.  Patient Profile     83 y.o. male with history of CAD s/p CABG x 1 in 07/2007, chronic Afib s/p Watchman device in 08/2015 previously on Eliquis with GI bleed noted in the setting  of AV malformations, severe aortic stenosis s/p bioprosthetic AVR, chronic diastolic CHF, stroke, and bladder cancer, frequent hospital admission lately (most recently 3/7-3/10 for same issue) who returned to [facility] with progressive SOB and decreased appetite.   Assessment & Plan    1. Acute on chronic diastolic CHF: -Has diuresed well on IV Lasix 60 mg bid, though I/O's are incompletely recorded. Wt down 6 pounds since admission. Continue with IV furosemide and potassium repletion, as echo shows severe pulmonary hypertension with clinical evidence of continued fluid retention. -Renal function still improving with diuresis -Daily standing weights -Strict I's and O's -Coreg  2. Permanent Afib s/p Watchman: -Currently rate controlled -Coreg -ASA 81 mg daily  3. CAD s/p CABG x 1 in 2009: -No symptoms concerning for angina -Minimal troponin elevated of 0.03 that down trended -Likely supply demand ischemia 2/2 volume overload, anemia, CKD -Echo with preserved LVEF -No plans for inpatient ischemic evaluation at this time -Continue carvedilol, isosorbide mononitrate, ASA, and atorvastatin; given age and comorbidities, deescalation of statin therapy would be reasonable to minimize risk for side effects.  4. Decreased appetite: -Consider checking albumin level, this may be contributing to some of his swelling 2/2 third spacing  - Nutrition consult may be helpful.  5. Anemia: -HGB stable this morning, minimal downward trend since admission -Monitor but low threshold for transfusion of PRBC if hemoglobin drops further (he typically is less symptomatic with HGB > 9 to 9.5)  6. CKD  stage IV: -Renal function improving with diuresis -Monitor    Signed, Nelva Bush, MD [place] Pager: [phone] 08/27/2016, 8:33 AM

## 2016-08-27 NOTE — Evaluation (Signed)
Physical Therapy Evaluation Patient Details [name] MRN: [id number] DOB: 10/25/1927 Today's Date: 08/27/2016   History of Present Illness  Pt admitted for acute on chronic CHF with complaints of SOB symptoms. Recent admission for similar symptoms. History includes CHF, HTN, DM, and CAD s/p CABG. Pt receiving HHPT  Clinical Impression  Pt is a pleasant 83 year old male who was admitted for SOB symptoms secondary to CHF. Pt performs bed mobility with mod assist, transfers with min assist, and ambulation with cga and RW. Pt fatigues quickly with endurance and needs seated rest break, limiting ambulation distance. Heavy labored breathing noted. Pt demonstrates deficits with strength/endurance/mobility. Would benefit from skilled PT to address above deficits and promote optimal return to PLOF; recommend transition to STR upon discharge from acute hospitalization. Discussed disposition with pt, not agreeable to STR, wants to dc home at this time.       Follow Up Recommendations SNF    Equipment Recommendations  None recommended by PT    Recommendations for Other Services       Precautions / Restrictions Precautions Precautions: Fall Restrictions Weight Bearing Restrictions: No      Mobility  Bed Mobility Overal bed mobility: Needs Assistance Bed Mobility: Supine to Sit;Sit to Supine     Supine to sit: Mod assist Sit to supine: Min assist   General bed mobility comments: assist for sliding B LE off bed and trunk support for sitting at EOB. Pt able to scoot towards EOB and sit with upright posture. Once back in bed, needs mod assist for scooting up towards [place].  Transfers Overall transfer level: Needs assistance Equipment used: Rolling walker (2 wheeled) Transfers: Sit to/from Stand Sit to Stand: Min assist         General transfer comment: mod assist for 1st attempt, unable to clear buttock, bed elevated and pt needs min assist for successful sit<>Stand  transfer  Ambulation/Gait Ambulation/Gait assistance: Min guard Ambulation Distance (Feet): 40 Feet Assistive device: Rolling walker (2 wheeled) Gait Pattern/deviations: Step-through pattern     General Gait Details: Pt ambulated in room, however limited by SOB symptoms. Pt breathing heavily, further ambulation deferred. HR at 88 and O2 sats at 93%.  Stairs            Wheelchair Mobility    Modified Rankin (Stroke Patients Only)       Balance Overall balance assessment: Needs assistance Sitting-balance support: Feet supported Sitting balance-[name]: Good     Standing balance support: Bilateral upper extremity supported Standing balance-[name]: Fair                               Pertinent Vitals/Pain Pain Assessment: No/denies pain    Home Living Family/patient expects to be discharged to:: Private residence Living Arrangements: Alone Available Help at Discharge: Personal care attendant Type of Home: House Home Access: Stairs to enter Entrance Stairs-Rails: None (has grab bars installed) Technical brewer of Steps: 3 Home Layout: Able to live on main level with bedroom/bathroom;Two level Home Equipment: Walker - 2 wheels;Walker - 4 wheels;Cane - single point Additional Comments: Pt currently has caregivers 7 days/week for ADLs    Prior Function Level of Independence: Independent with assistive device(s)         Comments: Pt uses RW for all mobility and rollater for community distances     Hand Dominance        Extremity/Trunk Assessment   Upper Extremity  Assessment Upper Extremity Assessment: Generalized weakness (B UE grossly 3+/5)    Lower Extremity Assessment Lower Extremity Assessment: Generalized weakness (B LE grossly 3/5)       Communication   Communication: No difficulties  Cognition Arousal/Alertness: Awake/alert Behavior During Therapy: WFL for tasks assessed/performed Overall Cognitive Status: Within  Functional Limits for tasks assessed                      General Comments      Exercises Other Exercises Other Exercises: Supine ther-ex performed x 10 reps including SLRs, hip add, and quad sets. Pt encouraged to perform second set this PM. All ther-ex performed x 10 reps with min assist   Assessment/Plan    PT Assessment Patient needs continued PT services  PT Problem List Decreased strength;Decreased activity tolerance;Decreased balance;Decreased mobility       PT Treatment Interventions Gait training;DME instruction;Therapeutic exercise    PT Goals (Current goals can be found in the Care Plan section)  Acute Rehab PT Goals Patient Stated Goal: to become less SOB  PT Goal Formulation: With patient Time For Goal Achievement: 09/10/16 Potential to Achieve Goals: Good    Frequency Min 2X/week   Barriers to discharge        Co-evaluation               End of Session Equipment Utilized During Treatment: Gait belt Activity Tolerance: Patient tolerated treatment well Patient left: in bed;with bed alarm set Nurse Communication: Mobility status PT Visit Diagnosis: Unsteadiness on feet (R26.81);Muscle weakness (generalized) (M62.81);Difficulty in walking, not elsewhere classified (R26.2)         Time: 2241-1464 PT Time Calculation (min) (ACUTE ONLY): 30 min   Charges:   PT Evaluation $PT Eval Moderate Complexity: 1 Procedure PT Treatments $Therapeutic Exercise: 8-22 mins   PT G Codes:         Theo Krumholz 09/16/16, 11:27 AM Greggory Stallion, PT, DPT [phone]

## 2016-08-27 NOTE — Consult Note (Signed)
   [facility] Inpatient Consult   08/27/2016  Tamika Nou Hett Nov 20, 1927 [id number]    [facility] Care Management referral received. Telephone call to Mr. Klepper's room to speak with him about [place] Management services. He is agreeable and verbal consent obtained. He states he does have aide services and will have [place] as well. Explained that [place] Management program will not interfere with any of his other services like home health. Confirmed Primary Care Md as Dr. Derrel Nip. Confirmed best contact number is [phone]. Made Mr. Sedgwick aware that he will receive post hospital discharge call and will be evaluated for monthly home visits.  Made inpatient RNCM aware that [place] Management will follow up.   Will request for [place] to follow up. He has had x 7 hospitalizations in the past 6 months. He has extreme 55% risk for unplanned readmission score.   Marthenia Rolling, MSN-Ed, RN,BSN [facility] Liaison [phone]

## 2016-08-27 NOTE — Progress Notes (Signed)
[name] [name]    MR#:  [id number]  DATE OF BIRTH:  14-Oct-1927  SUBJECTIVE:  CHIEF COMPLAINT:   Chief Complaint  Patient presents with  . Shortness of Breath  . Weakness  The patient is 83 year old male with medical history significant for history of aortic stenosis status post bioprosthetic replacement, coronary artery disease, coronary artery bypass grafting, diastolic CHF, stroke, diabetes, hypertension, hyperlipidemia, also arthritis, atrial fibrillation, who presented to the hospital with complaints of shortness of breath. He admitted of orthopnea, dry cough, 4 pound weight gain in the past 24 hours. He noted lower extremity edema. Chest x-ray revealed CHF and he was admitted to the hospital for further evaluation and treatment. He feels better Today after diuresis ofabout 6.4 L since admission. He still remains fluid overloaded, his diuretic was advanced to 60 mg twice daily dose and recommended to be continued by cardiologist. Echocardiogram revealed normal functioning aortic valve prosthesis, severe pulmonary hypertension. Creatinine remains stable and improving. Patient feels comfortable today, denies any shortness of breath, pain. Admits of lower extremity swelling   Review of Systems  Constitutional: Negative for chills, fever and weight loss.  HENT: Negative for congestion.   Eyes: Negative for blurred vision and double vision.  Respiratory: Negative for sputum production.   Cardiovascular: Positive for leg swelling. Negative for chest pain, palpitations, orthopnea and PND.  Gastrointestinal: Negative for abdominal pain, blood in stool, constipation, diarrhea, nausea and vomiting.  Genitourinary: Negative for dysuria, frequency, hematuria and urgency.  Musculoskeletal: Negative for falls.  Neurological: Negative for dizziness, tremors, focal weakness and headaches.  Endo/Heme/Allergies: Does not  bruise/bleed easily.  Psychiatric/Behavioral: Negative for depression. The patient does not have insomnia.     VITAL SIGNS: Blood pressure (!) 116/43, pulse 63, temperature 98.1 F (36.7 C), temperature source Oral, resp. rate 18, height 5\' 10"  (1.778 m), weight 105.8 kg (233 lb 4 oz), SpO2 94 %.  PHYSICAL EXAMINATION:   GENERAL:  83 y.o.-year-old patient lying in the bed in mild respiratory distress, intermittently tachypneic, , but overall more comfortable today.  EYES: Pupils equal, round, reactive to light and accommodation. No scleral icterus. Extraocular muscles intact.  HEENT: Head atraumatic, normocephalic. Oropharynx and nasopharynx clear.  NECK:  Supple, no jugular venous distention. No thyroid enlargement, no tenderness.  LUNGS: Some diminished breath sounds bilaterally, no wheezing, rales,rhonchi , few crepitations at bases. Intermittent use of accessory muscles of respiration,  on exertion.  CARDIOVASCULAR: S1, S2 , irregularly irregular. . No murmurs, rubs, or gallops.  ABDOMEN: Soft, nontender, nondistended. Bowel sounds present. No organomegaly or mass. Abdominal wall edema EXTREMITIES: 2+ lower extremity and pedal edema, no cyanosis, or clubbing.  NEUROLOGIC: Cranial nerves II through XII are intact. Muscle strength 5/5 in all extremities. Sensation intact. Gait not checked.  PSYCHIATRIC: The patient is alert and oriented x 3.  SKIN: No obvious rash, lesion, or ulcer.   ORDERS/RESULTS REVIEWED:   CBC  Recent Labs Lab 08/24/16 1708 08/26/16 0421 08/27/16 0433  WBC 6.0 6.3  --   HGB 9.2* 8.9* 8.7*  HCT 27.2* 26.9*  --   PLT 234 247  --   MCV 83.7 83.5  --   MCH 28.2 27.7  --   MCHC 33.7 33.1  --   RDW 16.5* 16.4*  --   LYMPHSABS 0.5*  --   --   MONOABS 0.8  --   --   EOSABS 0.5  --   --  BASOSABS 0.1  --   --    ------------------------------------------------------------------------------------------------------------------  Chemistries   Recent  Labs Lab 08/24/16 1708 08/25/16 0426 08/26/16 0418 08/27/16 0433  NA 129* 134* 133* 133*  K 4.3 3.6 4.1 4.2  CL 94* 97* 94* 94*  CO2 27 29 29 31   GLUCOSE 207* 100* 133* 172*  BUN 53* 56* 57* 51*  CREATININE 2.61* 2.49* 2.36* 2.12*  CALCIUM 8.6* 8.7* 8.9 8.7*   ------------------------------------------------------------------------------------------------------------------ estimated creatinine clearance is 29.9 mL/min (A) (by C-G formula based on SCr of 2.12 mg/dL (H)). ------------------------------------------------------------------------------------------------------------------  Recent Labs  08/24/16 1708  TSH 4.713*    Cardiac Enzymes  Recent Labs Lab 08/24/16 1708 08/26/16 0418  TROPONINI 0.03* <0.03   ------------------------------------------------------------------------------------------------------------------ Invalid input(s): POCBNP ---------------------------------------------------------------------------------------------------------------  RADIOLOGY: No results found.  EKG:  Orders placed or performed during the hospital encounter of 08/24/16  . EKG 12-Lead  . EKG 12-Lead  . EKG 12-Lead  . EKG 12-Lead    ASSESSMENT AND PLAN:  Active Problems:   Acute on chronic diastolic congestive heart failure ([place])  #1. Acute on chronic diastolic CHF, continue Lasix 60 mg intravenously twice a day, following in's and outs, weight, patient diuresed about 6.4  L since admission and feels somewhat better, now on room air. The patient was seen by cardiologist, who recommended to continue Coreg, Lasix at 60 mg twice daily dose. Echocardiogram revealed normal ejection fraction, normal functioning prosthetic aortic valve, marked pulmonary hypertension at 70 mmHg #2. Hyponatremia in  fluid overloaded patient, improved initially with diuretics, but now it's worse,. However, stable  #3. Chronic renal failure, CK D stage IV, relatively stable, improved  with  diuresis #4. Elevated troponin, due to demand ischemia, continue aspirin, Coreg, Imdur, statin, no chest pains reported #5. Permanent atrial fibrillation, rate controlled on Coreg, patient did not tolerate oral anticoagulants due to GI bleed, continue aspirin therapy #6. Generalized weakness, getting physical therapist and will follow recommendations. The patient's son is interested in placement   Management plans discussed with the patient, family and they are in agreement.   DRUG ALLERGIES:  Allergies  Allergen Reactions  . Asa [Aspirin] Other (See Comments)    Reaction:  Caused stomach ulcer patient can take 81 mg.  . Losartan Other (See Comments)    Decreased pulse rate  . Metoprolol Other (See Comments)    Reaction:  Bradycardia   . Penicillins Swelling and Other (See Comments)    Has patient had a PCN reaction causing immediate rash, facial/tongue/throat swelling, SOB or lightheadedness with hypotension: No Has patient had a PCN reaction causing severe rash involving mucus membranes or skin necrosis: No Has patient had a PCN reaction that required hospitalization No Has patient had a PCN reaction occurring within the last 10 years: No If all of the above answers are "NO", then may proceed with Cephalosporin use.    CODE STATUS:     Code Status Orders        Start     Ordered   08/24/16 2155  Full code  Continuous     08/24/16 2154    Code Status History    Date Active Date Inactive Code Status Order ID Comments User Context   08/14/2016  7:22 PM 08/17/2016  2:19 PM Full Code [id number]  Fritzi Mandes, MD Inpatient   07/16/2016 11:39 PM 07/19/2016  3:53 PM Full Code [id number]  Harvie Bridge, DO Inpatient   06/19/2016  7:35 PM 06/21/2016  7:35 PM Partial Code [id number]  Theodoro Grist, MD  ED   06/01/2016  6:21 PM 06/04/2016  6:52 PM Full Code [id number]  Shon Millet, DO Inpatient   05/08/2016  8:27 AM 05/16/2016  4:41 PM DNR [id number]  Everrett Coombe, MD Inpatient    05/02/2016  2:18 PM 05/08/2016  8:27 AM Full Code [id number]  Steve Rattler, DO Inpatient   05/02/2016  1:22 PM 05/02/2016  2:18 PM Partial Code [id number]  Elberta Leatherwood, MD ED   05/02/2016  1:12 PM 05/02/2016  1:22 PM DNR [id number]  Merrily Pew, MD ED   03/22/2016  3:48 PM 03/24/2016  4:45 PM Full Code [id number]  Loletha Grayer, MD ED   09/19/2015  7:27 PM 09/21/2015  5:47 PM Full Code [id number]  Lytle Butte, MD ED   07/28/2015  6:39 PM 07/31/2015  4:57 PM Full Code [id number]  [name] Costa, MD ED   07/10/2015 11:37 PM 07/12/2015 11:15 PM Full Code [id number]  Rise Patience, MD Inpatient   04/12/2015  7:05 PM 04/17/2015  7:10 PM Full Code [id number]  Geradine Girt, DO Inpatient   06/29/2014 12:24 PM 07/09/2014  2:15 PM Full Code [id number]  Bary Leriche, PA-C Inpatient    Advance Directive Documentation     Most Recent Value  Type of Advance Directive  Healthcare Power of Attorney  Pre-existing out of facility DNR order (yellow form or pink MOST form)  -  "MOST" Form in Place?  -      TOTAL TIME TAKING CARE OF THIS PATIENT: 35 minutes.  Discussed with Dr. Jolaine Click M.D on 08/27/2016 at 4:35 PM  Between 7am to 6pm - Pager - [phone]  After 6pm go to [email or web address] - password [email or web address] [facility]  Office  [phone]  CC: Primary care physician; [name], MD

## 2016-08-28 DIAGNOSIS — J810 Acute pulmonary edema: Secondary | ICD-10-CM

## 2016-08-28 DIAGNOSIS — D631 Anemia in chronic kidney disease: Secondary | ICD-10-CM

## 2016-08-28 DIAGNOSIS — N189 Chronic kidney disease, unspecified: Secondary | ICD-10-CM

## 2016-08-28 LAB — BASIC METABOLIC PANEL
ANION GAP: 8 (ref 5–15)
BUN: 58 mg/dL — ABNORMAL HIGH (ref 6–20)
CHLORIDE: 93 mmol/L — AB (ref 101–111)
CO2: 31 mmol/L (ref 22–32)
Calcium: 8.8 mg/dL — ABNORMAL LOW (ref 8.9–10.3)
Creatinine, Ser: 2.31 mg/dL — ABNORMAL HIGH (ref 0.61–1.24)
GFR calc non Af Amer: 24 mL/min — ABNORMAL LOW (ref >60)
GFR, EST AFRICAN AMERICAN: 28 mL/min — AB (ref >60)
Glucose, Bld: 267 mg/dL — ABNORMAL HIGH (ref 65–99)
Potassium: 4.5 mmol/L (ref 3.5–5.1)
Sodium: 132 mmol/L — ABNORMAL LOW (ref 135–145)

## 2016-08-28 LAB — GLUCOSE, CAPILLARY
GLUCOSE-CAPILLARY: 117 mg/dL — AB (ref 65–99)
Glucose-Capillary: 258 mg/dL — ABNORMAL HIGH (ref 65–99)
Glucose-Capillary: 270 mg/dL — ABNORMAL HIGH (ref 65–99)
Glucose-Capillary: 319 mg/dL — ABNORMAL HIGH (ref 65–99)
Glucose-Capillary: 182 mg/dL — ABNORMAL HIGH (ref 65–99)

## 2016-08-28 LAB — CBC
HEMATOCRIT: 26.9 % — AB (ref 40.0–52.0)
Hemoglobin: 8.9 g/dL — ABNORMAL LOW (ref 13.0–18.0)
MCH: 27.6 pg (ref 26.0–34.0)
MCHC: 33 g/dL (ref 32.0–36.0)
MCV: 83.7 fL (ref 80.0–100.0)
Platelets: 239 10*3/uL (ref 150–440)
RBC: 3.21 MIL/uL — AB (ref 4.40–5.90)
RDW: 15.9 % — ABNORMAL HIGH (ref 11.5–14.5)
WBC: 6.4 10*3/uL (ref 3.8–10.6)

## 2016-08-28 MED ORDER — TORSEMIDE 20 MG PO TABS
40.0000 mg | ORAL_TABLET | Freq: Two times a day (BID) | ORAL | Status: DC
  Administered 2016-08-28 – 2016-08-29 (×3): 40 mg via ORAL
  Filled 2016-08-28 (×3): qty 2

## 2016-08-28 NOTE — Clinical Social Work Note (Signed)
CSW received consult that patient is now considering going to SNF for short term rehab.  Patient would like [facility], patient gave CSW permission to begin bed search.  Jones Broom. Norval Morton, MSW, [place]  08/28/2016 5:23 PM

## 2016-08-28 NOTE — Progress Notes (Signed)
Patient refused bed alarm. Educated about calling for assistance and to not get up alone.

## 2016-08-28 NOTE — Progress Notes (Signed)
[facility] [place] [name]    MR#:  [id number]  DATE OF BIRTH:  09/06/1927  SUBJECTIVE:  CHIEF COMPLAINT:   Chief Complaint  Patient presents with  . Shortness of Breath  . Weakness  The patient is 83 year old male with medical history significant for history of aortic stenosis status post bioprosthetic replacement, coronary artery disease, coronary artery bypass grafting, diastolic CHF, stroke, diabetes, hypertension, hyperlipidemia, also arthritis, atrial fibrillation, who presented to the hospital with complaints of shortness of breath. He admitted of orthopnea, dry cough, 4 pound weight gain in the past 24 hours. He noted lower extremity edema. Chest x-ray revealed CHF and he was admitted to the hospital for further evaluation and treatment. He feels better after diuresis ofabout 6.6 L since admission. He still remains fluid overloaded in his lower extremities, his diuretic was age 34 oral, although objeced by cardiologist. Echocardiogram revealed normal functioning aortic valve prosthesis, severe pulmonary hypertension. Creatinine got somewhat worse with diuresis, estimated GFR 24. Patient feels good today, denies shortness of breath, pain. Admits of lower extremity swelling, improving. He is agreeable to skilled nursing facility placement for close supervision. Discussed with care management/social worker.    Review of Systems  Constitutional: Negative for chills, fever and weight loss.  HENT: Negative for congestion.   Eyes: Negative for blurred vision and double vision.  Respiratory: Negative for sputum production.   Cardiovascular: Positive for leg swelling. Negative for chest pain, palpitations, orthopnea and PND.  Gastrointestinal: Negative for abdominal pain, blood in stool, constipation, diarrhea, nausea and vomiting.  Genitourinary: Negative for dysuria, frequency, hematuria and urgency.  Musculoskeletal:  Negative for falls.  Neurological: Negative for dizziness, tremors, focal weakness and headaches.  Endo/Heme/Allergies: Does not bruise/bleed easily.  Psychiatric/Behavioral: Negative for depression. The patient does not have insomnia.     VITAL SIGNS: Blood pressure (!) 120/52, pulse 62, temperature 98 F (36.7 C), temperature source Oral, resp. rate 18, height 5\' 10"  (1.778 m), weight 106.3 kg (234 lb 6.4 oz), SpO2 97 %.  PHYSICAL EXAMINATION:   GENERAL:  83 y.o.-year-old patient lying in the bed , not in distress, able to lay flat  EYES: Pupils equal, round, reactive to light and accommodation. No scleral icterus. Extraocular muscles intact.  HEENT: Head atraumatic, normocephalic. Oropharynx and nasopharynx clear.  NECK:  Supple, no jugular venous distention. No thyroid enlargement, no tenderness.  LUNGS: Some diminished breath sounds bilaterally, no wheezing, rales,rhonchi , crepitations, but no significant respiratory distress, laying down flat.  CARDIOVASCULAR: S1, S2 , irregularly irregular. . No murmurs, rubs, or gallops.  ABDOMEN: Soft, nontender, nondistended. Bowel sounds present. No organomegaly or mass. Abdominal wall edema EXTREMITIES: 2+ lower extremity and pedal edema, improved from before, no cyanosis, or clubbing.  NEUROLOGIC: Cranial nerves II through XII are intact. Muscle strength 5/5 in all extremities. Sensation intact. Gait not checked.  PSYCHIATRIC: The patient is alert and oriented x 3.  SKIN: No obvious rash, lesion, or ulcer.   ORDERS/RESULTS REVIEWED:   CBC  Recent Labs Lab 08/24/16 1708 08/26/16 0421 08/27/16 0433 08/28/16 0830  WBC 6.0 6.3  --  6.4  HGB 9.2* 8.9* 8.7* 8.9*  HCT 27.2* 26.9*  --  26.9*  PLT 234 247  --  239  MCV 83.7 83.5  --  83.7  MCH 28.2 27.7  --  27.6  MCHC 33.7 33.1  --  33.0  RDW 16.5* 16.4*  --  15.9*  LYMPHSABS 0.5*  --   --   --  MONOABS 0.8  --   --   --   EOSABS 0.5  --   --   --   BASOSABS 0.1  --   --   --     ------------------------------------------------------------------------------------------------------------------  Chemistries   Recent Labs Lab 08/24/16 1708 08/25/16 0426 08/26/16 0418 08/27/16 0433 08/28/16 0311  NA 129* 134* 133* 133* 132*  K 4.3 3.6 4.1 4.2 4.5  CL 94* 97* 94* 94* 93*  CO2 27 29 29 31 31   GLUCOSE 207* 100* 133* 172* 267*  BUN 53* 56* 57* 51* 58*  CREATININE 2.61* 2.49* 2.36* 2.12* 2.31*  CALCIUM 8.6* 8.7* 8.9 8.7* 8.8*   ------------------------------------------------------------------------------------------------------------------ estimated creatinine clearance is 27.5 mL/min (A) (by C-G formula based on SCr of 2.31 mg/dL (H)). ------------------------------------------------------------------------------------------------------------------ No results for input(s): TSH, T4TOTAL, T3FREE, THYROIDAB in the last 72 hours.  Invalid input(s): FREET3  Cardiac Enzymes  Recent Labs Lab 08/24/16 1708 08/26/16 0418  TROPONINI 0.03* <0.03   ------------------------------------------------------------------------------------------------------------------ Invalid input(s): POCBNP ---------------------------------------------------------------------------------------------------------------  RADIOLOGY: No results found.  EKG:  Orders placed or performed during the hospital encounter of 08/24/16  . EKG 12-Lead  . EKG 12-Lead  . EKG 12-Lead  . EKG 12-Lead    ASSESSMENT AND PLAN:  Active Problems:   Acute on chronic diastolic congestive heart failure (HCC)   Acute pulmonary edema ([place])  #1. Acute on chronic diastolic CHF, Lasix 60 mg intravenously twice a dayChanged to torsemide 40 mg twice daily orally,, following in's and outs, weight, patient diuresed about 6.6  L since admission and feels better, now on room air. The patient was seen by cardiologist, who recommended to continue Coreg, Lasix , may need to resume intravenous doses. Echocardiogram  revealed normal ejection fraction, normal functioning prosthetic aortic valve, marked pulmonary hypertension at 70 mmHg. Kidney function was somewhat worse, follow creatinine in the morning #2. Hyponatremia in  fluid overloaded patient, improved initially with diuretics, but now it's worse. Reassess tomorrow with torsemide #3. Chronic renal failure, CK D stage IV, relatively stable, little worse today with diuresis, recheck in the morning #4. Elevated troponin, due to demand ischemia, continue aspirin, Coreg, Imdur, statin, no chest pains reported #5. Permanent atrial fibrillation, rate controlled on Coreg, patient did not tolerate oral anticoagulants due to GI bleed, continue aspirin therapy #6. Generalized weakness,  The patient's son is interested in placement, patient is agreeable to be placed in [place] , Discussed with care management.   Management plans discussed with the patient, family and they are in agreement.   DRUG ALLERGIES:  Allergies  Allergen Reactions  . Asa [Aspirin] Other (See Comments)    Reaction:  Caused stomach ulcer patient can take 81 mg.  . Losartan Other (See Comments)    Decreased pulse rate  . Metoprolol Other (See Comments)    Reaction:  Bradycardia   . Penicillins Swelling and Other (See Comments)    Has patient had a PCN reaction causing immediate rash, facial/tongue/throat swelling, SOB or lightheadedness with hypotension: No Has patient had a PCN reaction causing severe rash involving mucus membranes or skin necrosis: No Has patient had a PCN reaction that required hospitalization No Has patient had a PCN reaction occurring within the last 10 years: No If all of the above answers are "NO", then may proceed with Cephalosporin use.    CODE STATUS:     Code Status Orders        Start     Ordered   08/24/16 2155  Full code  Continuous  08/24/16 2154    Code Status History    Date Active Date Inactive Code Status Order ID Comments User Context    08/14/2016  7:22 PM 08/17/2016  2:19 PM Full Code [id number]  Fritzi Mandes, MD Inpatient   07/16/2016 11:39 PM 07/19/2016  3:53 PM Full Code [id number]  Harvie Bridge, DO Inpatient   06/19/2016  7:35 PM 06/21/2016  7:35 PM Partial Code [id number]  Theodoro Grist, MD ED   06/01/2016  6:21 PM 06/04/2016  6:52 PM Full Code [id number]  Shon Millet, DO Inpatient   05/08/2016  8:27 AM 05/16/2016  4:41 PM DNR [id number]  Everrett Coombe, MD Inpatient   05/02/2016  2:18 PM 05/08/2016  8:27 AM Full Code [id number]  Steve Rattler, DO Inpatient   05/02/2016  1:22 PM 05/02/2016  2:18 PM Partial Code [id number]  Elberta Leatherwood, MD ED   05/02/2016  1:12 PM 05/02/2016  1:22 PM DNR [id number]  Merrily Pew, MD ED   03/22/2016  3:48 PM 03/24/2016  4:45 PM Full Code [id number]  Loletha Grayer, MD ED   09/19/2015  7:27 PM 09/21/2015  5:47 PM Full Code [id number]  Lytle Butte, MD ED   07/28/2015  6:39 PM 07/31/2015  4:57 PM Full Code [id number]  [name] Costa, MD ED   07/10/2015 11:37 PM 07/12/2015 11:15 PM Full Code [id number]  Rise Patience, MD Inpatient   04/12/2015  7:05 PM 04/17/2015  7:10 PM Full Code [id number]  Geradine Girt, DO Inpatient   06/29/2014 12:24 PM 07/09/2014  2:15 PM Full Code [id number]  Bary Leriche, PA-C Inpatient    Advance Directive Documentation     Most Recent Value  Type of Advance Directive  Healthcare Power of Attorney  Pre-existing out of facility DNR order (yellow form or pink MOST form)  -  "MOST" Form in Place?  -      TOTAL TIME TAKING CARE OF THIS PATIENT: 35 minutes.  Discussed with Care management   Marinna Blane M.D on 08/28/2016 at 4:41 PM  Between 7am to 6pm - Pager - [phone]  After 6pm go to [email or web address] - password [email or web address] [facility]  Office  [phone]  CC: Primary care physician; [name], MD

## 2016-08-28 NOTE — NC FL2 (Signed)
[place] LEVEL OF CARE SCREENING TOOL     IDENTIFICATION  Patient [name] Birthdate: 1928/04/26 Sex: male Admission Date (Current Location): 08/24/2016  [place] and [place] Number:  Engineering geologist and Address:  [facility], [address], [place], [place] [address]      Provider Number: [id number]  Attending Physician Name and Address:  Theodoro Grist, MD  Relative Name and Phone Number:  Treyden, Hakim. Son [phone]  [phone] or Mccahill,Debbie Relative   [phone] or Merrie Roof [phone]     Current Level of Care: Hospital Recommended Level of Care: [place] Prior Approval Number:    Date Approved/Denied:   PASRR Number: [id number]  Discharge Plan: SNF    Current Diagnoses: Patient Active Problem List   Diagnosis Date Noted  . Acute pulmonary edema (HCC)   . Acute on chronic diastolic congestive heart failure ([place]) 08/24/2016  . CHF (congestive heart failure) ([place]) 08/14/2016  . Permanent atrial fibrillation ([place])   . Stage 4 chronic kidney disease ([place])   . HTN (hypertension) 07/26/2016  . Pressure injury of skin 07/19/2016  . Difficulty in walking, not elsewhere classified   . Pressure ulcer 07/17/2016  . History of GI bleed 07/17/2016  . Acute respiratory distress 07/17/2016  . Peripheral edema   . Acute gastroenteritis 06/19/2016  . Leukocytosis 06/19/2016  . Anemia 06/19/2016  . Hypoglycemia 06/19/2016  . Frequent falls 06/01/2016  . Fall 06/01/2016  . Muscle weakness (generalized)   . COPD (chronic obstructive pulmonary disease) ([place])   . Anemia of chronic disease   . Upper GI bleed 09/19/2015  . Minimal cerumen bilateral ear canals 08/18/2015  . Insomnia secondary to anxiety 07/19/2015  . Acute on chronic diastolic heart failure ([place]) 07/10/2015  . Chronic diastolic heart failure ([place]) 04/20/2015  . Symptomatic anemia   . Macular degeneration, dry  01/20/2015  . Arthritis 01/20/2015  . Monoplegia of arm as complication of stroke ([place]) 09/01/2014  . Diabetic polyneuropathy associated with type 2 diabetes mellitus ([place]) 08/05/2014  . CKD (chronic kidney disease) stage 3, GFR 30-59 ml/min 07/21/2014  . Hypertensive heart disease with chronic systolic congestive heart failure ([place]) 06/30/2014  . Cerebral infarction ([place]) 06/29/2014  . Anemia in chronic kidney disease 11/10/2013  . Chronic atrial fibrillation ([place]) 05/12/2013  . Mobitz type 1 second degree atrioventricular block 10/01/2012  . S/P aortic valve replacement with bioprosthetic valve 03/24/2012  . Nonrheumatic aortic valve stenosis   . Junctional bradycardia   . Hyperlipidemia   . Angiodysplasia of stomach   . Coronary artery disease involving native coronary artery of native heart without angina pectoris     Orientation RESPIRATION BLADDER Height & Weight     Self, Time, Situation, Place  Normal Continent Weight: 234 lb 6.4 oz (106.3 kg) Height:  5\' 10"  (177.8 cm)  BEHAVIORAL SYMPTOMS/MOOD NEUROLOGICAL BOWEL NUTRITION STATUS      Continent Diet (Carb Modified diet)  AMBULATORY STATUS COMMUNICATION OF NEEDS Skin   Limited Assist Verbally Normal                       Personal Care Assistance Level of Assistance  Bathing, Feeding, Dressing Bathing Assistance: Limited assistance Feeding assistance: Independent Dressing Assistance: Limited assistance     Functional Limitations Info  Sight, Hearing, Speech Sight Info: Adequate Hearing Info: Adequate Speech Info: Adequate    SPECIAL CARE FACTORS FREQUENCY  PT (By licensed PT)     PT  Frequency: 5x a week              Contractures Contractures Info: Not present    Additional Factors Info  Code Status, Allergies Code Status Info: Full Code Allergies Info: ASA ASPIRIN, LOSARTAN, METOPROLOL, PENICILLINS            Current Medications (08/28/2016):  This is the current hospital active medication  list Current Facility-Administered Medications  Medication Dose Route Frequency Provider Last Rate Last Dose  . 0.9 %  sodium chloride infusion  250 mL Intravenous PRN Alexis Hugelmeyer, DO      . acetaminophen (TYLENOL) tablet 650 mg  650 mg Oral Q4H PRN Alexis Hugelmeyer, DO   650 mg at 08/24/16 2359  . ALPRAZolam Duanne Moron) tablet 0.25 mg  0.25 mg Oral QHS PRN Alexis Hugelmeyer, DO      . aspirin EC tablet 81 mg  81 mg Oral Daily Alexis Hugelmeyer, DO   81 mg at 08/28/16 0900  . atorvastatin (LIPITOR) tablet 40 mg  40 mg Oral q1800 Theodoro Grist, MD   40 mg at 08/28/16 1707  . calcitRIOL (ROCALTROL) capsule 0.25 mcg  0.25 mcg Oral Once per day on Mon Wed Fri Alexis Hugelmeyer, DO   0.25 mcg at 08/28/16 0900  . carvedilol (COREG) tablet 6.25 mg  6.25 mg Oral BID Alexis Hugelmeyer, DO   6.25 mg at 08/28/16 0900  . cholecalciferol (VITAMIN D) tablet 1,000 Units  1,000 Units Oral Daily Alexis Hugelmeyer, DO   1,000 Units at 08/28/16 0900  . docusate (COLACE) 50 MG/5ML liquid 50 mg  50 mg Oral BID Fritzi Mandes, MD   50 mg at 08/28/16 0900  . escitalopram (LEXAPRO) tablet 10 mg  10 mg Oral Daily Alexis Hugelmeyer, DO   10 mg at 08/28/16 0859  . ferrous sulfate tablet 325 mg  325 mg Oral BID WC Alexis Hugelmeyer, DO   325 mg at 08/28/16 1707  . heparin injection 5,000 Units  5,000 Units Subcutaneous Q8H Alexis Hugelmeyer, DO   5,000 Units at 08/28/16 1328  . HYDROcodone-acetaminophen (NORCO/VICODIN) 5-325 MG per tablet 1 tablet  1 tablet Oral Daily PRN Alexis Hugelmeyer, DO      . hydroxychloroquine (PLAQUENIL) tablet 200 mg  200 mg Oral Daily Alexis Hugelmeyer, DO   200 mg at 08/28/16 0859  . insulin aspart (novoLOG) injection 0-5 Units  0-5 Units Subcutaneous QHS Theodoro Grist, MD      . insulin aspart (novoLOG) injection 0-9 Units  0-9 Units Subcutaneous TID WC Theodoro Grist, MD   Stopped at 08/28/16 1700  . insulin aspart (novoLOG) injection 3 Units  3 Units Subcutaneous TID WC Theodoro Grist, MD   3  Units at 08/28/16 1707  . insulin glargine (LANTUS) injection 30 Units  30 Units Subcutaneous Daily Theodoro Grist, MD   30 Units at 08/28/16 0900  . isosorbide mononitrate (IMDUR) 24 hr tablet 30 mg  30 mg Oral Daily Alexis Hugelmeyer, DO   30 mg at 08/28/16 0859  . ketotifen (ZADITOR) 0.025 % ophthalmic solution 1 drop  1 drop Right Eye BID Alexis Hugelmeyer, DO   1 drop at 08/28/16 0909  . loratadine (CLARITIN) tablet 10 mg  10 mg Oral Daily Alexis Hugelmeyer, DO   10 mg at 08/28/16 0900  . multivitamin with minerals tablet 1 tablet  1 tablet Oral Daily Alexis Hugelmeyer, DO   1 tablet at 08/28/16 0900  . multivitamin-lutein (OCUVITE-LUTEIN) capsule 1 capsule  1 capsule Oral BID Fritzi Mandes, MD  1 capsule at 08/28/16 0900  . ondansetron (ZOFRAN) injection 4 mg  4 mg Intravenous Q6H PRN Alexis Hugelmeyer, DO      . pantoprazole (PROTONIX) EC tablet 40 mg  40 mg Oral Daily Alexis Hugelmeyer, DO   40 mg at 08/28/16 0859  . sodium chloride flush (NS) 0.9 % injection 3 mL  3 mL Intravenous Q12H Alexis Hugelmeyer, DO   3 mL at 08/28/16 0901  . sodium chloride flush (NS) 0.9 % injection 3 mL  3 mL Intravenous PRN Alexis Hugelmeyer, DO      . tamsulosin (FLOMAX) capsule 0.4 mg  0.4 mg Oral Daily Alexis Hugelmeyer, DO   0.4 mg at 08/27/16 2142  . torsemide (DEMADEX) tablet 40 mg  40 mg Oral BID Theodoro Grist, MD   40 mg at 08/28/16 1707  . vitamin C (ASCORBIC ACID) tablet 500 mg  500 mg Oral Daily Alexis Hugelmeyer, DO   500 mg at 08/28/16 0900     Discharge Medications: Please see discharge summary for a list of discharge medications.  Relevant Imaging Results:  Relevant Lab Results:   Additional Information SSN [id number]  Ross Ludwig, [place]

## 2016-08-28 NOTE — Progress Notes (Signed)
Patient [name] Date of Encounter: 08/28/2016  Primary Cardiologist: [facility] Problem List     Active Problems:   Acute on chronic diastolic congestive heart failure (HCC)     Subjective   No acute ovnernight events. SOB remains about the same. No hgb this morning. SCr trending upwards from 2.1-->2.3. BUN climbing. CO2 stable. UOP 1.1 L for the past 24 hours and -6.7 L for the admission. Weight down 5 pounds for the admission, though up 1 pound over the past 24 hours.   Inpatient Medications    Scheduled Meds: . aspirin EC  81 mg Oral Daily  . atorvastatin  40 mg Oral q1800  . calcitRIOL  0.25 mcg Oral Once per day on Mon Wed Fri  . carvedilol  6.25 mg Oral BID  . cholecalciferol  1,000 Units Oral Daily  . docusate  50 mg Oral BID  . escitalopram  10 mg Oral Daily  . ferrous sulfate  325 mg Oral BID WC  . heparin  5,000 Units Subcutaneous Q8H  . hydroxychloroquine  200 mg Oral Daily  . insulin aspart  0-5 Units Subcutaneous QHS  . insulin aspart  0-9 Units Subcutaneous TID WC  . insulin aspart  3 Units Subcutaneous TID WC  . insulin glargine  30 Units Subcutaneous Daily  . isosorbide mononitrate  30 mg Oral Daily  . ketotifen  1 drop Right Eye BID  . loratadine  10 mg Oral Daily  . multivitamin with minerals  1 tablet Oral Daily  . multivitamin-lutein  1 capsule Oral BID  . pantoprazole  40 mg Oral Daily  . sodium chloride flush  3 mL Intravenous Q12H  . tamsulosin  0.4 mg Oral Daily  . torsemide  40 mg Oral BID  . vitamin C  500 mg Oral Daily   Continuous Infusions:  PRN Meds: sodium chloride, acetaminophen, ALPRAZolam, HYDROcodone-acetaminophen, ondansetron (ZOFRAN) IV, sodium chloride flush   Vital Signs    Vitals:   08/27/16 1125 08/27/16 1933 08/28/16 0342 08/28/16 0342  BP: (!) 116/43 (!) 132/49  (!) 147/49  Pulse: 63 61  75  Resp: 18 18  18   Temp: 98.1 F (36.7 C) 97.8 F (36.6 C)  97.8 F (36.6 C)  TempSrc: Oral Oral      SpO2: 94% 95%  92%  Weight:   234 lb 6.4 oz (106.3 kg)   Height:        Intake/Output Summary (Last 24 hours) at 08/28/16 0801 Last data filed at 08/28/16 0342  Gross per 24 hour  Intake              840 ml  Output             2000 ml  Net            -1160 ml   Filed Weights   08/26/16 0359 08/27/16 0434 08/28/16 0342  Weight: 237 lb 3.2 oz (107.6 kg) 233 lb 4 oz (105.8 kg) 234 lb 6.4 oz (106.3 kg)    Physical Exam    GEN: Well nourished, well developed, in no acute distress.  HEENT: Grossly normal.  Neck: Supple, JVD elevated ~ 8 cm, no carotid bruits, or masses. Cardiac: Irregularly irregular, no murmurs, rubs, or gallops. No clubbing, cyanosis, improved LE edema.  Radials/DP/PT 2+ and equal bilaterally.  Respiratory:  Diminished breath sounds in the lower lung fields bilaterally. GI: Soft, nontender, nondistended, BS + x 4. MS: no deformity or atrophy.  Skin: warm and dry, no rash. Neuro:  Strength and sensation are intact. Psych: AAOx3.  Normal affect.  Labs    CBC  Recent Labs  08/26/16 0421 08/27/16 0433  WBC 6.3  --   HGB 8.9* 8.7*  HCT 26.9*  --   MCV 83.5  --   PLT 247  --    Basic Metabolic Panel  Recent Labs  08/27/16 0433 08/28/16 0311  NA 133* 132*  K 4.2 4.5  CL 94* 93*  CO2 31 31  GLUCOSE 172* 267*  BUN 51* 58*  CREATININE 2.12* 2.31*  CALCIUM 8.7* 8.8*   Liver Function Tests No results for input(s): AST, ALT, ALKPHOS, BILITOT, PROT, ALBUMIN in the last 72 hours. No results for input(s): LIPASE, AMYLASE in the last 72 hours. Cardiac Enzymes  Recent Labs  08/26/16 0418  TROPONINI <0.03   BNP Invalid input(s): POCBNP D-Dimer No results for input(s): DDIMER in the last 72 hours. Hemoglobin A1C No results for input(s): HGBA1C in the last 72 hours. Fasting Lipid Panel No results for input(s): CHOL, HDL, LDLCALC, TRIG, CHOLHDL, LDLDIRECT in the last 72 hours. Thyroid Function Tests No results for input(s): TSH, T4TOTAL, T3FREE,  THYROIDAB in the last 72 hours.  Invalid input(s): FREET3  Telemetry    Afib, 70s bpm - Personally Reviewed  ECG    n/a - Personally Reviewed  Radiology    No results found.  Cardiac Studies   TTE from 08/26/16 Study Conclusions  - Left ventricle: The cavity size was normal. There was moderate concentric hypertrophy. Systolic function was normal. The estimated ejection fraction was in the range of 55% to 60%. Images were inadequate for LV wall motion assessment. The study was not technically sufficient to allow evaluation of LV diastolic dysfunction due to atrial fibrillation. - Aortic valve: A bioprosthesis was present and functioning normally. Mean gradient (S): 9 mm Hg. Peak gradient (S): 17 mm Hg. Valve area (VTI): 1.38 cm^2. - Mitral valve: Calcified annulus. - Left atrium: The atrium was moderately dilated. - Right atrium: The atrium was mildly dilated. - Pulmonary arteries: Systolic pressure was severely increased. PA peak pressure: 70 mm Hg (S). - Inferior vena cava: Not visualized.   TTE from 03/2016: Study Conclusions  - Left ventricle: The cavity size was normal. There was mild concentric hypertrophy. Systolic function was normal. The estimated ejection fraction was in the range of 55% to 60%. Wall motion was normal; there were no regional wall motion abnormalities. The study is not technically sufficient to allow evaluation of LV diastolic function. - Aortic valve: A bioprosthesis was present. Transvalvular velocity was within the normal range for prosthetic valve Peak velocity (S): 241 cm/s. Mean gradient (S): 14 mm Hg. Peak gradient (S): 23 mm Hg. - Left atrium: The atrium was mild to moderately dilated. - Right ventricle: Systolic function was normal. - Pulmonary arteries: Systolic pressure could not be accurately estimated. - Inferior vena cava: The vessel was dilated. The respirophasic diameter changes  were blunted (<50%), consistent with elevated central venous pressure.  Impressions:  - Rhythm is atrial fibrillation.  Recommendations: Challenging image quality.  Patient Profile     83 y.o. male with history of CAD s/p CABG x 1 in 07/2007, chronic Afib s/p Watchman device in 08/2015 previously on Eliquis with GI bleed noted in the setting of AV malformations, severe aortic stenosis s/p bioprosthetic AVR, chronic diastolic CHF, stroke, and bladder cancer, frequent hospital admission lately (most recently 3/7-3/10 for same issue) who returned to  [place] with progressive SOB and decreased appetite.  Assessment & Plan    1. Acute on chronic diastolic CHF: -Has diuresed well on IV Lasix 60 mg bid, though I/O's are incompletely recorded. Wt down 5 pounds since admission -Renal function bumped this morning -IM has discontinued IV Lasix and placed back on home torsemide -Echo showed severe pulmonary hypertension with continued evidence of fluid retention, may need further IV diuresis, though with a decreased dose -Continue KCl repletion and potassium repletion -Daily standing weights -Strict I's and O's -Coreg  2. Permanent Afib s/p Watchman: -Currently rate controlled -Coreg -ASA 81 mg daily  3. CAD s/p CABG x 1 in 2009: -No symptoms concerning for angina -Minimal troponin elevated of 0.03 that down trended -Likely supply demand ischemia 2/2 volume overload, anemia, CKD -Echo with preserved LVEF -No plans for inpatient ischemic evaluation at this time -Continue carvedilol, isosorbide mononitrate, ASA, and atorvastatin; given age and comorbidities, deescalation of statin therapy would be reasonable to minimize risk for side effects.  4. Decreased appetite: -Consider checking albumin level, this may be contributing to some of his swelling 2/2 third spacing, defer to IM -Nutrition consult may be helpful, defer to IM  5. Anemia: -HGB pending this morning -Has been slowly  down trending without etiology  -He generally feels much better with a HGB in the 9.5 range -Monitor but low threshold for transfusion of PRBC if hemoglobin drops further (he typically is less symptomatic with HGB > 9 to 9.5)  6. CKD stage IV: -Renal function bumped this morning -As above -Monitor   Signed, Marcille Blanco [place] Pager: [phone] 08/28/2016, 8:01 AM   Attending Note Patient seen and examined, agree with detailed note above,  Patient presentation and plan discussed on rounds.   This morning reports having continued shortness of breath, Feels he has more fluid to come off Started on torsemide 40 twice a day  Long discussion concerning his blood count  On physical exam clear breath sounds bilaterally,  Unable to estimate JVP,heart sounds irregular, Abdomen soft nontender, trace lower extremity edema to below the knees bilaterally  Lab work reviewed showing stable hematocrit 26.9 Creatinine 2.3, Around his baseline  ----Acute on chronic diastolic CHF Medicine service has changed to oral torsemide 40 twice a day Suspect he still has extra fluid given leg edema Stable renal function within his baseline range Would try to ambulate him and if he continues to have significant shortness of breath with any movement, would consider restarting IV Lasix High risk of readmission, He will benefit from home nursing, Daily weights, IV diuresis at home  --- Anemia Contributing to CHF symptoms above Does better with hemoglobin of 10 We'll defer management to hematology  Greater than 50% was spent in counseling and coordination of care with patient Total encounter time 25 minutes or more   Signed: Esmond Plants  M.D., Ph.D. [facility]

## 2016-08-28 NOTE — Clinical Social Work Note (Signed)
CSW received referral for SNF.  Case discussed with case manager and plan is to discharge home with home health.  CSW to sign off please re-consult if social work needs arise.  Raden Byington R. Demaris Bousquet, MSW, LCSWA [phone]  

## 2016-08-28 NOTE — Care Management (Addendum)
Attending states that she has spoken with patient's family and anticipate need for snf.  She spoke with patient and he is in agreement.  Facility preference is [facility].  Discussed need for palliative to follow as outpatient. Family is interested in long term care but they have not discussed this with the patient.  Discussed with attending would start out as a skilled plan if care and family can address this with patient.  Anticipate discharge within the next 24-48 hours. Updated csw

## 2016-08-28 NOTE — Progress Notes (Signed)
Inpatient Diabetes Program Recommendations  AACE/ADA: New Consensus Statement on Inpatient Glycemic Control (2015)  Target Ranges:  Prepandial:   less than 140 mg/dL      Peak postprandial:   less than 180 mg/dL (1-2 hours)      Critically ill patients:  140 - 180 mg/dL   Lab Results  Component Value Date   GLUCAP 319 (H) 08/28/2016   HGBA1C 6.6 (H) 08/25/2016    Review of Glycemic Control  Diabetes history: Type 2 Outpatient Diabetes medications: Novolog 3 units tid, Lantus 35 units qday Current orders for Inpatient glycemic control: Lantus 30 units qday, Novolog 3 units tid, Novolog 0-9 units tid, Novolog 0-5 units qhs  Inpatient Diabetes Program Recommendations:    Fasting glucose elevated the last 2 mornings (314, 270). Consider increasing Lantus back to home dose of 35 units.  This way the patient will not receive as much Novolog at the meal times. Patient will hold on to Novolog longer due to renal function. Patient may need a more sensitive custom Novolog correction scale if his glucose drops again.   Thanks,  Tama Headings RN, MSN, [facility] Inpatient Diabetes Coordinator Team Pager [phone] (8a-5p)

## 2016-08-28 NOTE — Clinical Social Work Note (Signed)
CSW received phone call from [facility] who said they can accept patient tomorrow if he is medically ready for discharge and orders have been received.  CSW updated patient, and he was was pleased with the information.  CSW to continue to follow patient's progress throughout discharge planning.  Jones Broom. Norval Morton, MSW, [place]  08/28/2016 5:44 PM

## 2016-08-29 ENCOUNTER — Encounter
Admission: RE | Admit: 2016-08-29 | Discharge: 2016-08-29 | Disposition: A | Discharge status: Home / Self Care | Payer: Medicare Other | Source: Ambulatory Visit | Attending: Internal Medicine | Admitting: Internal Medicine

## 2016-08-29 DIAGNOSIS — N184 Chronic kidney disease, stage 4 (severe): Secondary | ICD-10-CM | POA: Diagnosis present

## 2016-08-29 DIAGNOSIS — E871 Hypo-osmolality and hyponatremia: Secondary | ICD-10-CM | POA: Diagnosis not present

## 2016-08-29 DIAGNOSIS — J449 Chronic obstructive pulmonary disease, unspecified: Secondary | ICD-10-CM | POA: Diagnosis not present

## 2016-08-29 DIAGNOSIS — I272 Pulmonary hypertension, unspecified: Secondary | ICD-10-CM | POA: Diagnosis not present

## 2016-08-29 DIAGNOSIS — Z7982 Long term (current) use of aspirin: Secondary | ICD-10-CM | POA: Diagnosis not present

## 2016-08-29 DIAGNOSIS — E785 Hyperlipidemia, unspecified: Secondary | ICD-10-CM | POA: Diagnosis not present

## 2016-08-29 DIAGNOSIS — Z951 Presence of aortocoronary bypass graft: Secondary | ICD-10-CM | POA: Diagnosis not present

## 2016-08-29 DIAGNOSIS — I69334 Monoplegia of upper limb following cerebral infarction affecting left non-dominant side: Secondary | ICD-10-CM | POA: Diagnosis not present

## 2016-08-29 DIAGNOSIS — D631 Anemia in chronic kidney disease: Secondary | ICD-10-CM | POA: Diagnosis not present

## 2016-08-29 DIAGNOSIS — I482 Chronic atrial fibrillation: Secondary | ICD-10-CM | POA: Diagnosis not present

## 2016-08-29 DIAGNOSIS — H353190 Nonexudative age-related macular degeneration, unspecified eye, stage unspecified: Secondary | ICD-10-CM | POA: Diagnosis not present

## 2016-08-29 DIAGNOSIS — M0589 Other rheumatoid arthritis with rheumatoid factor of multiple sites: Secondary | ICD-10-CM | POA: Diagnosis not present

## 2016-08-29 DIAGNOSIS — F329 Major depressive disorder, single episode, unspecified: Secondary | ICD-10-CM | POA: Diagnosis not present

## 2016-08-29 DIAGNOSIS — I110 Hypertensive heart disease with heart failure: Secondary | ICD-10-CM | POA: Diagnosis not present

## 2016-08-29 DIAGNOSIS — R778 Other specified abnormalities of plasma proteins: Secondary | ICD-10-CM

## 2016-08-29 DIAGNOSIS — I4891 Unspecified atrial fibrillation: Secondary | ICD-10-CM | POA: Diagnosis not present

## 2016-08-29 DIAGNOSIS — Z9889 Other specified postprocedural states: Secondary | ICD-10-CM | POA: Diagnosis not present

## 2016-08-29 DIAGNOSIS — D509 Iron deficiency anemia, unspecified: Secondary | ICD-10-CM | POA: Diagnosis not present

## 2016-08-29 DIAGNOSIS — R7989 Other specified abnormal findings of blood chemistry: Secondary | ICD-10-CM

## 2016-08-29 DIAGNOSIS — K219 Gastro-esophageal reflux disease without esophagitis: Secondary | ICD-10-CM | POA: Diagnosis not present

## 2016-08-29 DIAGNOSIS — R531 Weakness: Secondary | ICD-10-CM

## 2016-08-29 DIAGNOSIS — R748 Abnormal levels of other serum enzymes: Secondary | ICD-10-CM | POA: Diagnosis not present

## 2016-08-29 DIAGNOSIS — I2510 Atherosclerotic heart disease of native coronary artery without angina pectoris: Secondary | ICD-10-CM | POA: Diagnosis not present

## 2016-08-29 DIAGNOSIS — I5031 Acute diastolic (congestive) heart failure: Secondary | ICD-10-CM | POA: Diagnosis not present

## 2016-08-29 DIAGNOSIS — E119 Type 2 diabetes mellitus without complications: Secondary | ICD-10-CM | POA: Diagnosis not present

## 2016-08-29 DIAGNOSIS — I82502 Chronic embolism and thrombosis of unspecified deep veins of left lower extremity: Secondary | ICD-10-CM | POA: Diagnosis not present

## 2016-08-29 DIAGNOSIS — R262 Difficulty in walking, not elsewhere classified: Secondary | ICD-10-CM | POA: Diagnosis not present

## 2016-08-29 DIAGNOSIS — I5022 Chronic systolic (congestive) heart failure: Secondary | ICD-10-CM | POA: Diagnosis not present

## 2016-08-29 DIAGNOSIS — E1142 Type 2 diabetes mellitus with diabetic polyneuropathy: Secondary | ICD-10-CM | POA: Diagnosis not present

## 2016-08-29 DIAGNOSIS — M6281 Muscle weakness (generalized): Secondary | ICD-10-CM | POA: Diagnosis not present

## 2016-08-29 DIAGNOSIS — N400 Enlarged prostate without lower urinary tract symptoms: Secondary | ICD-10-CM | POA: Diagnosis not present

## 2016-08-29 DIAGNOSIS — I5032 Chronic diastolic (congestive) heart failure: Secondary | ICD-10-CM | POA: Diagnosis not present

## 2016-08-29 DIAGNOSIS — F0390 Unspecified dementia without behavioral disturbance: Secondary | ICD-10-CM | POA: Diagnosis not present

## 2016-08-29 DIAGNOSIS — I69939 Monoplegia of upper limb following unspecified cerebrovascular disease affecting unspecified side: Secondary | ICD-10-CM | POA: Diagnosis not present

## 2016-08-29 DIAGNOSIS — Z794 Long term (current) use of insulin: Secondary | ICD-10-CM | POA: Diagnosis not present

## 2016-08-29 DIAGNOSIS — I248 Other forms of acute ischemic heart disease: Secondary | ICD-10-CM

## 2016-08-29 DIAGNOSIS — Z953 Presence of xenogenic heart valve: Secondary | ICD-10-CM | POA: Diagnosis not present

## 2016-08-29 DIAGNOSIS — D638 Anemia in other chronic diseases classified elsewhere: Secondary | ICD-10-CM | POA: Diagnosis not present

## 2016-08-29 DIAGNOSIS — E1122 Type 2 diabetes mellitus with diabetic chronic kidney disease: Secondary | ICD-10-CM | POA: Diagnosis not present

## 2016-08-29 DIAGNOSIS — I5033 Acute on chronic diastolic (congestive) heart failure: Secondary | ICD-10-CM | POA: Diagnosis not present

## 2016-08-29 DIAGNOSIS — Z87891 Personal history of nicotine dependence: Secondary | ICD-10-CM | POA: Diagnosis not present

## 2016-08-29 DIAGNOSIS — Z79899 Other long term (current) drug therapy: Secondary | ICD-10-CM | POA: Diagnosis not present

## 2016-08-29 DIAGNOSIS — E1165 Type 2 diabetes mellitus with hyperglycemia: Secondary | ICD-10-CM | POA: Diagnosis not present

## 2016-08-29 LAB — BASIC METABOLIC PANEL
ANION GAP: 9 (ref 5–15)
BUN: 61 mg/dL — ABNORMAL HIGH (ref 6–20)
CALCIUM: 8.8 mg/dL — AB (ref 8.9–10.3)
CO2: 31 mmol/L (ref 22–32)
CREATININE: 2.52 mg/dL — AB (ref 0.61–1.24)
Chloride: 92 mmol/L — ABNORMAL LOW (ref 101–111)
GFR calc Af Amer: 25 mL/min — ABNORMAL LOW (ref >60)
GFR calc non Af Amer: 21 mL/min — ABNORMAL LOW (ref >60)
GLUCOSE: 377 mg/dL — AB (ref 65–99)
Potassium: 4.4 mmol/L (ref 3.5–5.1)
Sodium: 132 mmol/L — ABNORMAL LOW (ref 135–145)

## 2016-08-29 LAB — GLUCOSE, CAPILLARY
Glucose-Capillary: 339 mg/dL — ABNORMAL HIGH (ref 65–99)
Glucose-Capillary: 362 mg/dL — ABNORMAL HIGH (ref 65–99)

## 2016-08-29 MED ORDER — INSULIN ASPART 100 UNIT/ML ~~LOC~~ SOLN: 4.0000 [IU] | Freq: Three times a day (TID) | SUBCUTANEOUS | 11 refills | Status: AC

## 2016-08-29 MED ORDER — ATORVASTATIN CALCIUM 40 MG PO TABS: 40.0000 mg | ORAL_TABLET | Freq: Every day | ORAL | 2 refills | Status: DC

## 2016-08-29 MED ORDER — INSULIN GLARGINE 100 UNIT/ML ~~LOC~~ SOLN: 30.0000 [IU] | Freq: Every day | SUBCUTANEOUS | 11 refills | Status: DC

## 2016-08-29 MED ORDER — ALPRAZOLAM 0.25 MG PO TABS: 0.2500 mg | ORAL_TABLET | Freq: Every evening | ORAL | 0 refills | Status: DC | PRN

## 2016-08-29 MED ORDER — HYDROCODONE-ACETAMINOPHEN 5-325 MG PO TABS: 1.0000 | ORAL_TABLET | Freq: Every day | ORAL | 0 refills | Status: AC | PRN

## 2016-08-29 NOTE — Progress Notes (Signed)
Patient [name] Date of Encounter: 08/29/2016  Primary Cardiologist: [facility] Problem List     Active Problems:   Acute on chronic diastolic congestive heart failure (HCC)   Acute pulmonary edema (HCC)     Subjective   Breathing much better. Weight down 6 pounds for the admission. Net - 7 L for the admission. Renal function continues to increase this morning to 2.5 from 2.3. HGB stable on 3/21, no cbc this morning.   Inpatient Medications    Scheduled Meds: . aspirin EC  81 mg Oral Daily  . atorvastatin  40 mg Oral q1800  . calcitRIOL  0.25 mcg Oral Once per day on Mon Wed Fri  . carvedilol  6.25 mg Oral BID  . cholecalciferol  1,000 Units Oral Daily  . docusate  50 mg Oral BID  . escitalopram  10 mg Oral Daily  . ferrous sulfate  325 mg Oral BID WC  . heparin  5,000 Units Subcutaneous Q8H  . hydroxychloroquine  200 mg Oral Daily  . insulin aspart  0-5 Units Subcutaneous QHS  . insulin aspart  0-9 Units Subcutaneous TID WC  . insulin aspart  3 Units Subcutaneous TID WC  . insulin glargine  30 Units Subcutaneous Daily  . isosorbide mononitrate  30 mg Oral Daily  . ketotifen  1 drop Right Eye BID  . loratadine  10 mg Oral Daily  . multivitamin with minerals  1 tablet Oral Daily  . multivitamin-lutein  1 capsule Oral BID  . pantoprazole  40 mg Oral Daily  . sodium chloride flush  3 mL Intravenous Q12H  . tamsulosin  0.4 mg Oral Daily  . torsemide  40 mg Oral BID  . vitamin C  500 mg Oral Daily   Continuous Infusions:  PRN Meds: sodium chloride, acetaminophen, ALPRAZolam, HYDROcodone-acetaminophen, ondansetron (ZOFRAN) IV, sodium chloride flush   Vital Signs    Vitals:   08/28/16 0849 08/28/16 1218 08/28/16 1925 08/29/16 0353  BP: 136/62 (!) 120/52 (!) 133/45 (!) 138/53  Pulse: 77 62 64 66  Resp: 18 18 17 16   Temp: 97.5 F (36.4 C) 98 F (36.7 C) 97.9 F (36.6 C) 98 F (36.7 C)  TempSrc: Oral Oral Oral Oral  SpO2: 95% 97% 96% 97%    Weight:    233 lb 5.4 oz (105.8 kg)  Height:        Intake/Output Summary (Last 24 hours) at 08/29/16 0839 Last data filed at 08/29/16 0837  Gross per 24 hour  Intake              840 ml  Output             1100 ml  Net             -260 ml   Filed Weights   08/27/16 0434 08/28/16 0342 08/29/16 0353  Weight: 233 lb 4 oz (105.8 kg) 234 lb 6.4 oz (106.3 kg) 233 lb 5.4 oz (105.8 kg)    Physical Exam    GEN: Well nourished, well developed, in no acute distress.  HEENT: Grossly normal.  Neck: Supple, no JVD, carotid bruits, or masses. Cardiac: Irregularly irregular, no murmurs, rubs, or gallops. No clubbing, cyanosis, improved LE edema.  Radials/DP/PT 2+ and equal bilaterally.  Respiratory:  Respirations regular and unlabored, clear to auscultation bilaterally. GI: Soft, nontender, nondistended, BS + x 4. MS: no deformity or atrophy. Skin: warm and dry, no rash. Neuro:  Strength and sensation  are intact. Psych: AAOx3.  Normal affect.  Labs    CBC  Recent Labs  08/27/16 0433 08/28/16 0830  WBC  --  6.4  HGB 8.7* 8.9*  HCT  --  26.9*  MCV  --  83.7  PLT  --  696   Basic Metabolic Panel  Recent Labs  08/28/16 0311 08/29/16 0436  NA 132* 132*  K 4.5 4.4  CL 93* 92*  CO2 31 31  GLUCOSE 267* 377*  BUN 58* 61*  CREATININE 2.31* 2.52*  CALCIUM 8.8* 8.8*   Liver Function Tests No results for input(s): AST, ALT, ALKPHOS, BILITOT, PROT, ALBUMIN in the last 72 hours. No results for input(s): LIPASE, AMYLASE in the last 72 hours. Cardiac Enzymes No results for input(s): CKTOTAL, CKMB, CKMBINDEX, TROPONINI in the last 72 hours. BNP Invalid input(s): POCBNP D-Dimer No results for input(s): DDIMER in the last 72 hours. Hemoglobin A1C No results for input(s): HGBA1C in the last 72 hours. Fasting Lipid Panel No results for input(s): CHOL, HDL, LDLCALC, TRIG, CHOLHDL, LDLDIRECT in the last 72 hours. Thyroid Function Tests No results for input(s): TSH, T4TOTAL, T3FREE,  THYROIDAB in the last 72 hours.  Invalid input(s): FREET3  Telemetry    Afib, 70s bpm - Personally Reviewed  ECG    n/a - Personally Reviewed  Radiology    No results found.  Cardiac Studies   TTE from 08/26/16 Study Conclusions  - Left ventricle: The cavity size was normal. There was moderate concentric hypertrophy. Systolic function was normal. The estimated ejection fraction was in the range of 55% to 60%. Images were inadequate for LV wall motion assessment. The study was not technically sufficient to allow evaluation of LV diastolic dysfunction due to atrial fibrillation. - Aortic valve: A bioprosthesis was present and functioning normally. Mean gradient (S): 9 mm Hg. Peak gradient (S): 17 mm Hg. Valve area (VTI): 1.38 cm^2. - Mitral valve: Calcified annulus. - Left atrium: The atrium was moderately dilated. - Right atrium: The atrium was mildly dilated. - Pulmonary arteries: Systolic pressure was severely increased. PA peak pressure: 70 mm Hg (S). - Inferior vena cava: Not visualized.   TTE from 03/2016: Study Conclusions  - Left ventricle: The cavity size was normal. There was mild concentric hypertrophy. Systolic function was normal. The estimated ejection fraction was in the range of 55% to 60%. Wall motion was normal; there were no regional wall motion abnormalities. The study is not technically sufficient to allow evaluation of LV diastolic function. - Aortic valve: A bioprosthesis was present. Transvalvular velocity was within the normal range for prosthetic valve Peak velocity (S): 241 cm/s. Mean gradient (S): 14 mm Hg. Peak gradient (S): 23 mm Hg. - Left atrium: The atrium was mild to moderately dilated. - Right ventricle: Systolic function was normal. - Pulmonary arteries: Systolic pressure could not be accurately estimated. - Inferior vena cava: The vessel was dilated. The respirophasic diameter changes  were blunted (<50%), consistent with elevated central venous pressure.  Impressions:  - Rhythm is atrial fibrillation.  Recommendations: Challenging image quality.  Patient Profile     83 y.o. male with history of CAD s/p CABG x 1 in 07/2007, chronic Afib s/p Watchman device in 08/2015 previously on Eliquis with GI bleed noted in the setting of AV malformations, severe aortic stenosis s/p bioprosthetic AVR, chronic diastolic CHF, stroke, and bladder cancer, frequent hospital admission lately (most recently 3/7-3/10 for same issue) who returned to [facility] with progressive SOB and decreased appetite.  Assessment &  Plan    1. Acute on chronic diastolic CHF: -Has diuresed well on IV Lasix 60 mg bid, though I/O's are incompletely recorded. Wt down 6 pounds since admission -Renal function bumped again this morning -Was transitioned to PO torsemide from IV Lasix by IM on 3/21 -Will hold diuresis this morning -Echo showed severe pulmonary hypertension with continued evidence of fluid retention, may need further IV diuresis after diuretic holiday today -Continue KCl repletion as indicated -Daily standing weights -Strict I's and O's -Coreg  2. Permanent Afib s/p Watchman: -Currently rate controlled -Coreg -ASA 81 mg daily  3. CAD s/p CABG x 1 in 2009: -No symptoms concerning for angina -Minimal troponin elevated of 0.03 that down trended -Likely supply demand ischemia 2/2 volume overload, anemia, CKD -Echo with preserved LVEF -No plans for inpatient ischemic evaluation at this time -Continue carvedilol, isosorbide mononitrate, ASA, and atorvastatin; given age and comorbidities, deescalation of statin therapy would be reasonable to minimize risk for side effects.  4. Decreased appetite: -Consider checkingalbumin level, this may be contributing to some of his swelling 2/2 third spacing, defer to IM -Nutrition consult may be helpful, defer to IM  5. Anemia: -HGB low, though  stable -Has been slowly down trending without etiology  -He generally feels much better with a HGB in the 9.5 range -Monitor but low threshold for transfusion of PRBC if hemoglobin drops further (he typically is less symptomatic with HGB > 9 to 9.5)  6. Acute on CKD stage IV: -Renal function bumped again this morning -Torsemide on hold as above -Monitor   Signed, Christell Faith, PA-C [place] Pager: [phone] 08/29/2016, 8:39 AM

## 2016-08-29 NOTE — Progress Notes (Signed)
New referral for Palliative to follow at [facility] received from [place]. Plan is for discharge today. Referral made aware. Thank you. Flo Shanks RN, BSN, [place] and [facility] hospital Liaison [phone] c

## 2016-08-29 NOTE — Progress Notes (Signed)
  Physical Therapy Treatment Patient Details [name] MRN: [id number] DOB: May 30, 1928 Today's Date: 08/29/2016    History of Present Illness Pt admitted for acute on chronic CHF with complaints of SOB symptoms. Recent admission for similar symptoms. History includes CHF, HTN, DM, and CAD s/p CABG. Pt receiving HHPT    PT Comments    Pt was able to ambulate 40' x 2 with walker and min guard.  Pt with overall weak gait but does well.  Pt does take long seated rests during transitions.  Some c/o SOB with activity but sats remain >95%.    Follow Up Recommendations  SNF     Equipment Recommendations  None recommended by PT    Recommendations for Other Services       Precautions / Restrictions Precautions Precautions: Fall Restrictions Weight Bearing Restrictions: No    Mobility  Bed Mobility Overal bed mobility: Needs Assistance Bed Mobility: Supine to Sit;Sit to Supine     Supine to sit: Supervision;HOB elevated Sit to supine: Min assist;HOB elevated   General bed mobility comments: for le's  Transfers Overall transfer level: Needs assistance Equipment used: Rolling walker (2 wheeled) Transfers: Sit to/from Stand Sit to Stand: Min guard            Ambulation/Gait Ambulation/Gait assistance: Min guard Ambulation Distance (Feet): 40 Feet Assistive device: Rolling walker (2 wheeled) Gait Pattern/deviations: Step-through pattern   Gait velocity interpretation: Below normal speed for age/gender General Gait Details: 40' x 2 with seated rest break due to fatigue and SOB   Stairs            Wheelchair Mobility    Modified Rankin (Stroke Patients Only)       Balance Overall balance assessment: Needs assistance Sitting-balance support: Feet supported Sitting balance-[name]: Good     Standing balance support: Bilateral upper extremity supported Standing balance-[name]: Fair                      Cognition  Arousal/Alertness: Awake/alert Behavior During Therapy: WFL for tasks assessed/performed Overall Cognitive Status: Within Functional Limits for tasks assessed                      Exercises      General Comments        Pertinent Vitals/Pain      Home Living                      Prior Function            PT Goals (current goals can now be found in the care plan section) Progress towards PT goals: Progressing toward goals    Frequency    Min 2X/week      PT Plan Current plan remains appropriate    Co-evaluation             End of Session Equipment Utilized During Treatment: Gait belt Activity Tolerance: Patient tolerated treatment well Patient left: in bed         Time: 7342-8768 PT Time Calculation (min) (ACUTE ONLY): 17 min  Charges:  $Gait Training: 8-22 mins                    G Codes:       Chesley Noon, PTA 08/29/16, 9:54 AM

## 2016-08-29 NOTE — Clinical Social Work Note (Signed)
Clinical Social Work Assessment  Patient Details  [name] MRN: [id number] Date of Birth: Oct 01, 1927  Date of referral:  08/28/16               Reason for consult:  Facility Placement                Permission sought to share information with:  Family Supports, Customer service manager Permission granted to share information::  Yes, Verbal Permission Granted  Name::     [employer]. Son [phone]  [phone]   Agency::  SNF admissions  Relationship::     Contact Information:     Housing/Transportation Living arrangements for the past 2 months:  [place] of Information:  Patient Patient Interpreter Needed:  None Criminal Activity/Legal Involvement Pertinent to Current Situation/Hospitalization:  No - Comment as needed Significant Relationships:  Adult Children, Other(Comment) (Personal Caregivers) Lives with:  Self Do you feel safe going back to the place where you live?  No Need for family participation in patient care:  No (Coment)  Care giving concerns:  Patient and his family feel he needs short term rehab before he is able to return back home.   Social Worker assessment / plan:  Patient is a 83 year old male who lives alone and has caregivers help him around his home.  Patient is alert and oriented x4 and able to express his feelings.  Patient states he has been to rehab in the past and is familiar with the process and how insurance will pay for his stay.  CSW explained to patient what to expect at SNF and what the process is for finding bed placement.  Patient did not express any other questions or concerns, and gave CSW permission to begin bed search process.  Employment status:  Retired Forensic scientist:  Medicare PT Recommendations:  [place] / Referral to community resources:  [place]  Patient/Family's Response to care:  Patient and family in agreement to going to SNF for  short term rehab.  Patient/Family's Understanding of and Emotional Response to Diagnosis, Current Treatment, and Prognosis: Patient is aware of current treatment plan and hopeful that he will not have to be at SNF very long.  Emotional Assessment Appearance:  Appears stated age Attitude/Demeanor/Rapport:    Affect (typically observed):  Appropriate, Calm Orientation:  Oriented to Self, Oriented to Place, Oriented to  Time, Oriented to Situation Alcohol / Substance use:  Not Applicable Psych involvement (Current and /or in the community):  No (Comment)  Discharge Needs  Concerns to be addressed:  Lack of Support Readmission within the last 30 days:  No Current discharge risk:  Lack of support system, Lives alone Barriers to Discharge:  No Barriers Identified   Anell Barr 08/29/2016, 3:37 PM

## 2016-08-29 NOTE — Progress Notes (Signed)
Patient to be discharged to [facility], report given to [facility]. Patient status and d/c instructions gone over with nurse. Packet with prescriptions to be given to caregiver, she will transport patient to [facility] on ra. No distress noted

## 2016-08-29 NOTE — Progress Notes (Addendum)
Caregiver on floor to transport patient to [facility], packet given to caregiver to give to staff at facility. Iv removed, telemetry removed and returned

## 2016-08-29 NOTE — Progress Notes (Signed)
Inpatient Diabetes Program Recommendations  AACE/ADA: New Consensus Statement on Inpatient Glycemic Control (2015)  Target Ranges:  Prepandial:   less than 140 mg/dL      Peak postprandial:   less than 180 mg/dL (1-2 hours)      Critically ill patients:  140 - 180 mg/dL   Lab Results  Component Value Date   GLUCAP 182 (H) 08/28/2016   HGBA1C 6.6 (H) 08/25/2016    Review of Glycemic Control  Results for BRANNEN, KOPPEN (MRN [id number]) as of 08/29/2016 09:58  Ref. Range 08/28/2016 08:19 08/28/2016 12:19 08/28/2016 16:33 08/28/2016 21:31 08/29/2016 07:54  Glucose-Capillary Latest Ref Range: 65 - 99 mg/dL 270 (H) 319 (H) 117 (H) 182 (H) 339 (H)     Diabetes history:Type 2 Outpatient Diabetes medications: Novolog 3 units tid, Lantus 35 units qday  Current orders for Inpatient glycemic control: Lantus 30 units qday, Novolog 3 units tid, Novolog 0-9 units tid, Novolog 0-5 units qhs  Inpatient Diabetes Program Recommendations:    Fasting glucose elevated the last 3 mornings (314, 270, 339 mg/dl). Consider increasing Lantus back to home dose of 35 units.    Gentry Fitz, RN, BA, MHA, CDE Diabetes Coordinator Inpatient Diabetes Program  [phone] (Team Pager) [phone] ([place]) 08/29/2016 10:00 AM

## 2016-08-29 NOTE — Discharge Summary (Addendum)
[facility] [place] [name]    MR#:  [id number]  DATE OF BIRTH:  1928-05-29  DATE OF ADMISSION:  08/24/2016 ADMITTING PHYSICIAN: Ubaldo Glassing Hugelmeyer, DO  DATE OF DISCHARGE: No discharge date for patient encounter.  PRIMARY CARE PHYSICIAN: [name], MD     ADMISSION DIAGNOSIS:  Shortness of breath [R06.02] Acute pulmonary edema (HCC) [J81.0]  DISCHARGE DIAGNOSIS:  Principal Problem:   Acute pulmonary edema (HCC) Active Problems:   Acute on chronic diastolic congestive heart failure (HCC)   CKD (chronic kidney disease), stage IV (HCC)   Generalized weakness   Dementia   Moderate to severe pulmonary hypertension   Hyponatremia   Elevated troponin   Demand ischemia ([place])   SECONDARY DIAGNOSIS:   Past Medical History:  Diagnosis Date  . Anemia   . Aortic stenosis, severe    a. s/p bioprosthetic aortic valve replacement in 2009  . BPH (benign prostatic hypertrophy)   . CAD (coronary artery disease)    a. s/p CABG x 1 in 2009  . Chronic diastolic (congestive) heart failure   . CVA (cerebral infarction) 06/2014   Right MCA infarct  . Diabetes mellitus   . Gastrointestinal bleed    a. reccurent GIB  . History of bladder cancer   . Hyperlipidemia   . Hypertension   . Junctional bradycardia   . Macular degeneration   . Mobitz type 1 second degree atrioventricular block 10/01/2012  . OA (osteoarthritis)   . Permanent atrial fibrillation ([place])    a. s/p Watchman device 08/10/2015; b. on Eliquis    .pro HOSPITAL COURSE:   The patient is a 83 year old Caucasian male with past medical history significant for history of severe aortic stenosis, status post by prostatic aortic valve replacement, coronary artery disease, status post coronary artery bypass graft, diastolic CHF, severe pulmonary hypertension, stroke, diabetes, hyperlipidemia, hypertension, atrial fibrillation, who presents to the hospital  with complaints of shortness of breath. Patient is of acute shortness of breath associated with exertion, orthopnea, dry cough. He admitted to 4 pound weight gain over the past 24 hours, admitted of lower extremity edema. Chest x-ray done on admission revealed interstitial pulmonary edema with bilateral small pleural effusions. Labs revealed hyponatremia to 134, mild elevation of troponin to 0.03 on admission, improved after worse, mild anemia. Patient's hemoglobin A1c was found to be 6.6, TSH was found to be elevated at 4.7, however, free T4 was normal at 0.96. Patient was admitted to the hospital for further evaluation and treatment, initiated on IV diuretics and improved, he lost about 6 pounds during his hospitalization time. His oxygenation has improved as well as his lower extremity swelling. He was seen by cardiologist. While in the hospital who followed him along. His kidney function was followed closely, and it was found to be relatively stable, creatinine level was 2.49 on the day of admission, 2.52 on the day of discharge. Patient was evaluated by physical therapist and recommended skilled nursing facility placement for rehabilitation, patient's family requested placement due to worsening dementia. Discussion by problem: #1. Acute on chronic diastolic CHF, continue torsemide 40 mg twice daily orally,, following in's and outs, weight, patient diuresed about 6.85  L since admission, lost about 6 pounds, and feels better, now on room air, with oxygen saturations of 95-100% on room air. The patient was seen by cardiologist, who recommended to continue Coreg, torsemide.  Echocardiogram revealed normal ejection fraction, normal functioning bioprosthetic aortic  valve, marked pulmonary hypertension at 70 mmHg. Kidney function was relatively stable, follow creatinine as outpatient  #2. Hyponatremia in  fluid overloaded patient, improved initially with diuretics, but now it's slightly worse. Reassess as  outpatient on torsemide #3. Chronic renal failure, CK D stage IV, relatively stable,  recheck as outpatient, follow urinary output, weight closely. #4. Elevated troponin, due to demand ischemia, continue aspirin, Coreg, Imdur, statin, no chest pains reported #5. Permanent atrial fibrillation, rate controlled on Coreg, patient did not tolerate oral anticoagulants due to GI bleed, continue aspirin therapy #6. Generalized weakness and dementia,  The patient's son is interested in placement, patient is agreeable to be placed in [place], discussed with care management, discharging patient to skilled nursing facility today #7. Diabetes mellitus type 2, insulin Lantus was decreased to 30 units daily due to hypoglycemia, patient's meal insulin NovoLog was advanced to 4 units with each meal, follow blood glucose levels closely and administer sliding scale NovoLog as needed The patient may benefit from palliative care follow up in the facility DISCHARGE CONDITIONS:  Stable  CONSULTS OBTAINED:  Treatment Team:  Wende Bushy, MD  DRUG ALLERGIES:   Allergies  Allergen Reactions  . Asa [Aspirin] Other (See Comments)    Reaction:  Caused stomach ulcer patient can take 81 mg.  . Losartan Other (See Comments)    Decreased pulse rate  . Metoprolol Other (See Comments)    Reaction:  Bradycardia   . Penicillins Swelling and Other (See Comments)    Has patient had a PCN reaction causing immediate rash, facial/tongue/throat swelling, SOB or lightheadedness with hypotension: No Has patient had a PCN reaction causing severe rash involving mucus membranes or skin necrosis: No Has patient had a PCN reaction that required hospitalization No Has patient had a PCN reaction occurring within the last 10 years: No If all of the above answers are "NO", then may proceed with Cephalosporin use.    DISCHARGE MEDICATIONS:   Current Discharge Medication List    START taking these medications   Details  atorvastatin  (LIPITOR) 40 MG tablet Take 1 tablet (40 mg total) by mouth daily at 6 PM. Qty: 30 tablet, Refills: 2      CONTINUE these medications which have CHANGED   Details  ALPRAZolam (XANAX) 0.25 MG tablet Take 1 tablet (0.25 mg total) by mouth at bedtime as needed for anxiety. Qty: 30 tablet, Refills: 0    HYDROcodone-acetaminophen (NORCO/VICODIN) 5-325 MG tablet Take 1 tablet by mouth daily as needed for moderate pain. DO NOT EXCEED 4GM OF APAP IN 24 HOURS FROM ALL SOURCES Qty: 15 tablet, Refills: 0   Associated Diagnoses: Arthritis    insulin aspart (NOVOLOG) 100 UNIT/ML injection Inject 4 Units into the skin 3 (three) times daily before meals. Qty: 10 mL, Refills: 11    insulin glargine (LANTUS) 100 UNIT/ML injection Inject 0.3 mLs (30 Units total) into the skin daily. Qty: 10 mL, Refills: 11      CONTINUE these medications which have NOT CHANGED   Details  acetaminophen (TYLENOL) 325 MG tablet Take 2 tablets (650 mg total) by mouth every 6 (six) hours as needed for mild pain (or Fever >/= 101).    aspirin EC 81 MG tablet Take 1 tablet (81 mg total) by mouth daily. Qty: 90 tablet, Refills: 3    azelastine (OPTIVAR) 0.05 % ophthalmic solution Place 1 drop into the right eye 2 (two) times daily.    calcitRIOL (ROCALTROL) 0.25 MCG capsule TAKE ONE CAPSULE THREE TIMES  A WEEK (MONDAY, WEDNESDAY, AND FRIDAY) Qty: 15 capsule, Refills: 5    carvedilol (COREG) 6.25 MG tablet Take 1 tablet (6.25 mg total) by mouth 2 (two) times daily. Qty: 180 tablet, Refills: 0    cholecalciferol (VITAMIN D) 1000 units tablet Take 1,000 Units by mouth daily.    escitalopram (LEXAPRO) 10 MG tablet TAKE 1 TABLET EVERY DAY WITH DINNER Qty: 30 tablet, Refills: 3    ferrous sulfate 325 (65 FE) MG tablet Take 1 tablet (325 mg total) by mouth 2 (two) times daily with a meal. Qty: 60 tablet, Refills: 3    hydroxychloroquine (PLAQUENIL) 200 MG tablet Take 200 mg by mouth daily.    isosorbide mononitrate  (IMDUR) 30 MG 24 hr tablet Take 30 mg by mouth daily.    loratadine (CLARITIN) 10 MG tablet Take 10 mg by mouth daily.    Multiple Vitamin (MULTIVITAMIN WITH MINERALS) TABS tablet Take 1 tablet by mouth daily.    Multiple Vitamins-Minerals (PRESERVISION AREDS 2) CAPS Take 1 capsule by mouth 2 (two) times daily.    pantoprazole (PROTONIX) 40 MG tablet Take 1 tablet (40 mg total) by mouth daily. Qty: 90 tablet, Refills: 3    tamsulosin (FLOMAX) 0.4 MG CAPS capsule TAKE 1 CAPSULE BY MOUTH DAILY AFTER SUPPER Qty: 30 capsule, Refills: 5    torsemide (DEMADEX) 20 MG tablet Take 2 tablets (40 mg total) by mouth 2 (two) times daily. Qty: 60 tablet, Refills: 2    vitamin C (ASCORBIC ACID) 500 MG tablet Take 500 mg by mouth daily.         DISCHARGE INSTRUCTIONS:    The patient is to follow-up with primary care physician, primary cardiologist, nephrologist as outpatient  If you experience worsening of your admission symptoms, develop shortness of breath, life threatening emergency, suicidal or homicidal thoughts you must seek medical attention immediately by calling 911 or calling your MD immediately  if symptoms less severe.  You Must read complete instructions/literature along with all the possible adverse reactions/side effects for all the Medicines you take and that have been prescribed to you. Take any new Medicines after you have completely understood and accept all the possible adverse reactions/side effects.   Please note  You were cared for by a hospitalist during your hospital stay. If you have any questions about your discharge medications or the care you received while you were in the hospital after you are discharged, you can call the unit and asked to speak with the hospitalist on call if the hospitalist that took care of you is not available. Once you are discharged, your primary care physician will handle any further medical issues. Please note that NO REFILLS for any discharge  medications will be authorized once you are discharged, as it is imperative that you return to your primary care physician (or establish a relationship with a primary care physician if you do not have one) for your aftercare needs so that they can reassess your need for medications and monitor your lab values.    Today   CHIEF COMPLAINT:   Chief Complaint  Patient presents with  . Shortness of Breath  . Weakness    HISTORY OF PRESENT ILLNESS:  Rainier Feuerborn  is a 83 y.o. male with a known history of severe aortic stenosis, status post by prostatic aortic valve replacement, coronary artery disease, status post coronary artery bypass graft, diastolic CHF, severe pulmonary hypertension, stroke, diabetes, hyperlipidemia, hypertension, atrial fibrillation, who presents to the hospital with complaints of  shortness of breath. Patient is of acute shortness of breath associated with exertion, orthopnea, dry cough. He admitted to 4 pound weight gain over the past 24 hours, admitted of lower extremity edema. Chest x-ray done on admission revealed interstitial pulmonary edema with bilateral small pleural effusions. Labs revealed hyponatremia to 134, mild elevation of troponin to 0.03 on admission, improved after worse, mild anemia. Patient's hemoglobin A1c was found to be 6.6, TSH was found to be elevated at 4.7, however, free T4 was normal at 0.96. Patient was admitted to the hospital for further evaluation and treatment, initiated on IV diuretics and improved, he lost about 6 pounds during his hospitalization time. His oxygenation has improved as well as his lower extremity swelling. He was seen by cardiologist. While in the hospital who followed him along. His kidney function was followed closely, and it was found to be relatively stable, creatinine level was 2.49 on the day of admission, 2.52 on the day of discharge. Patient was evaluated by physical therapist and recommended skilled nursing facility  placement for rehabilitation, patient's family requested placement due to worsening dementia. Discussion by problem: #1. Acute on chronic diastolic CHF, continue torsemide 40 mg twice daily orally,, following in's and outs, weight, patient diuresed about 6.85  L since admission, lost about 6 pounds, and feels better, now on room air, with oxygen saturations of 95-100% on room air. The patient was seen by cardiologist, who recommended to continue Coreg, torsemide.  Echocardiogram revealed normal ejection fraction, normal functioning bioprosthetic aortic valve, marked pulmonary hypertension at 70 mmHg. Kidney function was relatively stable, follow creatinine as outpatient  #2. Hyponatremia in  fluid overloaded patient, improved initially with diuretics, but now it's slightly worse. Reassess as outpatient on torsemide #3. Chronic renal failure, CK D stage IV, relatively stable,  recheck as outpatient, follow urinary output, weight closely. #4. Elevated troponin, due to demand ischemia, continue aspirin, Coreg, Imdur, statin, no chest pains reported #5. Permanent atrial fibrillation, rate controlled on Coreg, patient did not tolerate oral anticoagulants due to GI bleed, continue aspirin therapy #6. Generalized weakness and dementia,  The patient's son is interested in placement, patient is agreeable to be placed in [place], discussed with care management, discharging patient to skilled nursing facility today #7. Diabetes mellitus type 2, insulin Lantus was decreased to 30 units daily due to hypoglycemia, patient's meal insulin NovoLog was advanced to 4 units with each meal, follow blood glucose levels closely and administer sliding scale NovoLog as needed  The patient may benefit from palliative care follow up in the facility  VITAL SIGNS:  Blood pressure (!) 130/59, pulse 60, temperature 98.3 F (36.8 C), temperature source Oral, resp. rate 18, height 5\' 10"  (1.778 m), weight 105.8 kg (233 lb 5.4 oz),  SpO2 95 %.  I/O:    Intake/Output Summary (Last 24 hours) at 08/29/16 1306 Last data filed at 08/29/16 1043  Gross per 24 hour  Intake             1080 ml  Output             1100 ml  Net              -20 ml    PHYSICAL EXAMINATION:  GENERAL:  83 y.o.-year-old patient lying in the bed with no acute distress.  EYES: Pupils equal, round, reactive to light and accommodation. No scleral icterus. Extraocular muscles intact.  HEENT: Head atraumatic, normocephalic. Oropharynx and nasopharynx clear.  NECK:  Supple, no jugular venous  distention. No thyroid enlargement, no tenderness.  LUNGS: Normal breath sounds bilaterally, no wheezing, rales,rhonchi or crepitation. No use of accessory muscles of respiration.  CARDIOVASCULAR: S1, S2 normal. No murmurs, rubs, or gallops.  ABDOMEN: Soft, non-tender, non-distended. Bowel sounds present. No organomegaly or mass.  EXTREMITIES: 1+ lower extremity and pedal edema, no cyanosis, or clubbing.  NEUROLOGIC: Cranial nerves II through XII are intact. Muscle strength 5/5 in all extremities. Sensation intact. Gait not checked.  PSYCHIATRIC: The patient is alert and oriented x 3.  SKIN: No obvious rash, lesion, or ulcer.   DATA REVIEW:   CBC  Recent Labs Lab 08/28/16 0830  WBC 6.4  HGB 8.9*  HCT 26.9*  PLT 239    Chemistries   Recent Labs Lab 08/29/16 0436  NA 132*  K 4.4  CL 92*  CO2 31  GLUCOSE 377*  BUN 61*  CREATININE 2.52*  CALCIUM 8.8*    Cardiac Enzymes  Recent Labs Lab 08/26/16 0418  TROPONINI <0.03    Microbiology Results  Results for orders placed or performed during the hospital encounter of 06/19/16  Gastrointestinal Panel by PCR , Stool     Status: Abnormal   Collection Time: 06/19/16  5:42 PM  Result Value Ref Range Status   Campylobacter species NOT DETECTED NOT DETECTED Final   Plesimonas shigelloides NOT DETECTED NOT DETECTED Final   Salmonella species NOT DETECTED NOT DETECTED Final   Yersinia  enterocolitica NOT DETECTED NOT DETECTED Final   Vibrio species NOT DETECTED NOT DETECTED Final   Vibrio cholerae NOT DETECTED NOT DETECTED Final   Enteroaggregative E coli (EAEC) NOT DETECTED NOT DETECTED Final   Enteropathogenic E coli (EPEC) NOT DETECTED NOT DETECTED Final   Enterotoxigenic E coli (ETEC) NOT DETECTED NOT DETECTED Final   Shiga like toxin producing E coli (STEC) NOT DETECTED NOT DETECTED Final   Shigella/Enteroinvasive E coli (EIEC) NOT DETECTED NOT DETECTED Final   Cryptosporidium NOT DETECTED NOT DETECTED Final   Cyclospora cayetanensis NOT DETECTED NOT DETECTED Final   Entamoeba histolytica NOT DETECTED NOT DETECTED Final   Giardia lamblia NOT DETECTED NOT DETECTED Final   Adenovirus F40/41 NOT DETECTED NOT DETECTED Final   Astrovirus NOT DETECTED NOT DETECTED Final   Norovirus GI/GII DETECTED (A) NOT DETECTED Final    Comment: CRITICAL RESULT CALLED TO, READ BACK BY AND VERIFIED WITH: STEVEN JONES 06/19/16 1952 KLW    Rotavirus A NOT DETECTED NOT DETECTED Final   Sapovirus (I, II, IV, and V) NOT DETECTED NOT DETECTED Final  C difficile quick scan w PCR reflex     Status: None   Collection Time: 06/19/16  5:42 PM  Result Value Ref Range Status   C Diff antigen NEGATIVE NEGATIVE Final   C Diff toxin NEGATIVE NEGATIVE Final   C Diff interpretation No C. difficile detected.  Final  Rapid Influenza A&B Antigens ([facility] only)     Status: None   Collection Time: 06/19/16  6:56 PM  Result Value Ref Range Status   Influenza A ([facility]) NEGATIVE NEGATIVE Final   Influenza B ([facility]) NEGATIVE NEGATIVE Final  MRSA PCR Screening     Status: None   Collection Time: 06/20/16  2:31 AM  Result Value Ref Range Status   MRSA by PCR NEGATIVE NEGATIVE Final    Comment:        The GeneXpert MRSA Assay (FDA approved for NASAL specimens only), is one component of a comprehensive MRSA colonization surveillance program. It is not intended to diagnose MRSA infection  nor to guide  or monitor treatment for MRSA infections.     RADIOLOGY:  No results found.  EKG:   Orders placed or performed during the hospital encounter of 08/24/16  . EKG 12-Lead  . EKG 12-Lead  . EKG 12-Lead  . EKG 12-Lead      Management plans discussed with the patient, family and they are in agreement.  CODE STATUS:     Code Status Orders        Start     Ordered   08/24/16 2155  Full code  Continuous     08/24/16 2154    Code Status History    Date Active Date Inactive Code Status Order ID Comments User Context   08/14/2016  7:22 PM 08/17/2016  2:19 PM Full Code [id number]  Fritzi Mandes, MD Inpatient   07/16/2016 11:39 PM 07/19/2016  3:53 PM Full Code [id number]  Harvie Bridge, DO Inpatient   06/19/2016  7:35 PM 06/21/2016  7:35 PM Partial Code [id number]  Theodoro Grist, MD ED   06/01/2016  6:21 PM 06/04/2016  6:52 PM Full Code [id number]  Shon Millet, DO Inpatient   05/08/2016  8:27 AM 05/16/2016  4:41 PM DNR [id number]  Everrett Coombe, MD Inpatient   05/02/2016  2:18 PM 05/08/2016  8:27 AM Full Code [id number]  Steve Rattler, DO Inpatient   05/02/2016  1:22 PM 05/02/2016  2:18 PM Partial Code [id number]  Elberta Leatherwood, MD ED   05/02/2016  1:12 PM 05/02/2016  1:22 PM DNR [id number]  Merrily Pew, MD ED   03/22/2016  3:48 PM 03/24/2016  4:45 PM Full Code [id number]  Loletha Grayer, MD ED   09/19/2015  7:27 PM 09/21/2015  5:47 PM Full Code [id number]  Lytle Butte, MD ED   07/28/2015  6:39 PM 07/31/2015  4:57 PM Full Code [id number]  [name] Costa, MD ED   07/10/2015 11:37 PM 07/12/2015 11:15 PM Full Code [id number]  Rise Patience, MD Inpatient   04/12/2015  7:05 PM 04/17/2015  7:10 PM Full Code [id number]  Geradine Girt, DO Inpatient   06/29/2014 12:24 PM 07/09/2014  2:15 PM Full Code [id number]  Bary Leriche, PA-C Inpatient    Advance Directive Documentation     Most Recent Value  Type of Advance Directive  Healthcare Power of Attorney  Pre-existing out of facility DNR order (yellow  form or pink MOST form)  -  "MOST" Form in Place?  -      TOTAL TIME TAKING CARE OF THIS PATIENT: 40 minutes.  Discussed with Dr. Kathi Ludwig M.D on 08/29/2016 at 1:06 PM  Between 7am to 6pm - Pager - [phone]  After 6pm go to [email or web address] - password [email or web address] [employer] [facility]  Office  [phone]  CC: Primary care physician; [name], MD

## 2016-08-29 NOTE — Clinical Social Work Note (Addendum)
Patient to be d/c'ed today to [facility].  Patient and family agreeable to plans will transport via caregiver's vehicle RN to call report to room 209A [phone].  CSW notified patient's son Jamaul Heist, Brooke Bonito that patient will be discharging today.  Evette Cristal, MSW, [place]

## 2016-08-29 NOTE — Clinical Social Work Placement (Signed)
   CLINICAL SOCIAL WORK PLACEMENT  NOTE  Date:  08/29/2016  Patient Details  [name] MRN: [id number] Date of Birth: [birth year]  Clinical Social Work is seeking post-discharge placement for this patient at the [place] level of care (*CSW will initial, date and re-position this form in  chart as items are completed):  Yes   Patient/family provided with [place] Work Department's list of facilities offering this level of care within the geographic area requested by the patient (or if unable, by the patient's family).  Yes   Patient/family informed of their freedom to choose among providers that offer the needed level of care, that participate in Medicare, Medicaid or managed care program needed by the patient, have an available bed and are willing to accept the patient.  Yes   Patient/family informed of [facility]'s ownership interest in [facility] and [facility], as well as of the fact that they are under no obligation to receive care at these facilities.  PASRR submitted to EDS on 08/28/16     PASRR number received on       Existing PASRR number confirmed on 08/28/16     FL2 transmitted to all facilities in geographic area requested by pt/family on 08/28/16     FL2 transmitted to all facilities within larger geographic area on       Patient informed that his/her managed care company has contracts with or will negotiate with certain facilities, including the following:        Yes   Patient/family informed of bed offers received.  Patient chooses bed at [facility]     Physician recommends and patient chooses bed at      Patient to be transferred to [facility] on 08/29/16.  Patient to be transferred to facility by Caregiver's personal car     Patient family notified on 08/29/16 of transfer.  Name of family member notified:  Mertie Moores, Brooke Bonito. patient's son.     PHYSICIAN Please sign FL2     Additional  Comment:    _______________________________________________ Ross Ludwig, LCSWA 08/29/2016, 3:57 PM

## 2016-08-30 ENCOUNTER — Telehealth: Payer: Self-pay | Admitting: Internal Medicine

## 2016-08-30 ENCOUNTER — Other Ambulatory Visit: Payer: Self-pay | Admitting: *Deleted

## 2016-08-30 ENCOUNTER — Other Ambulatory Visit: Payer: Medicare Other

## 2016-08-30 ENCOUNTER — Ambulatory Visit: Payer: Medicare Other | Admitting: Hematology and Oncology

## 2016-08-30 ENCOUNTER — Ambulatory Visit: Payer: Medicare Other

## 2016-08-30 DIAGNOSIS — N184 Chronic kidney disease, stage 4 (severe): Secondary | ICD-10-CM | POA: Diagnosis not present

## 2016-08-30 DIAGNOSIS — Z794 Long term (current) use of insulin: Secondary | ICD-10-CM | POA: Diagnosis not present

## 2016-08-30 DIAGNOSIS — E1122 Type 2 diabetes mellitus with diabetic chronic kidney disease: Secondary | ICD-10-CM | POA: Diagnosis not present

## 2016-08-30 DIAGNOSIS — I69939 Monoplegia of upper limb following unspecified cerebrovascular disease affecting unspecified side: Secondary | ICD-10-CM | POA: Diagnosis not present

## 2016-08-30 DIAGNOSIS — I5033 Acute on chronic diastolic (congestive) heart failure: Secondary | ICD-10-CM

## 2016-08-30 DIAGNOSIS — I5022 Chronic systolic (congestive) heart failure: Secondary | ICD-10-CM | POA: Diagnosis not present

## 2016-08-30 LAB — GLUCOSE, CAPILLARY
GLUCOSE-CAPILLARY: 257 mg/dL — AB (ref 65–99)
Glucose-Capillary: 393 mg/dL — ABNORMAL HIGH (ref 65–99)

## 2016-08-30 NOTE — Telephone Encounter (Signed)
FYI , patient has been discharged to [facility]. D>C hospital on 08/29/16 for SOB and Acute Pulmonary edema. Not eligible for TCM until release, from [place].

## 2016-08-30 NOTE — Consult Note (Signed)
Chart reviewed. Mr. Reihl discharged to [facility] on 08/29/16. Will request to be assigned to [facility] LCSW for follow up. He has had x7 hospitalizations in past 6 months.   Marthenia Rolling, MSN-Ed, RN,BSN [facility] Liaison [phone]

## 2016-09-02 ENCOUNTER — Inpatient Hospital Stay: Payer: Medicare Other

## 2016-09-02 ENCOUNTER — Inpatient Hospital Stay: Payer: Medicare Other | Admitting: Hematology and Oncology

## 2016-09-02 LAB — GLUCOSE, CAPILLARY
GLUCOSE-CAPILLARY: 320 mg/dL — AB (ref 65–99)
GLUCOSE-CAPILLARY: 317 mg/dL — AB (ref 65–99)
GLUCOSE-CAPILLARY: 99 mg/dL (ref 65–99)
Glucose-Capillary: 281 mg/dL — ABNORMAL HIGH (ref 65–99)
Glucose-Capillary: 127 mg/dL — ABNORMAL HIGH (ref 65–99)
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
## 2016-09-03 ENCOUNTER — Other Ambulatory Visit
Admission: RE | Admit: 2016-09-03 | Discharge: 2016-09-03 | Disposition: A | Discharge status: Home / Self Care | Payer: Medicare Other | Source: Ambulatory Visit | Attending: Gerontology | Admitting: Gerontology

## 2016-09-03 DIAGNOSIS — I110 Hypertensive heart disease with heart failure: Secondary | ICD-10-CM | POA: Insufficient documentation

## 2016-09-03 LAB — CBC WITH DIFFERENTIAL/PLATELET
BASOS PCT: 2 %
Basophils Absolute: 0.1 10*3/uL (ref 0–0.1)
EOS ABS: 0.8 10*3/uL — AB (ref 0–0.7)
Eosinophils Relative: 13 %
HEMATOCRIT: 26.6 % — AB (ref 40.0–52.0)
Hemoglobin: 8.8 g/dL — ABNORMAL LOW (ref 13.0–18.0)
Lymphocytes Relative: 12 %
Lymphs Abs: 0.7 10*3/uL — ABNORMAL LOW (ref 1.0–3.6)
MCH: 27.5 pg (ref 26.0–34.0)
MCHC: 33 g/dL (ref 32.0–36.0)
MCV: 83.4 fL (ref 80.0–100.0)
MONOS PCT: 14 %
Monocytes Absolute: 0.8 10*3/uL (ref 0.2–1.0)
Neutro Abs: 3.6 10*3/uL (ref 1.4–6.5)
Neutrophils Relative %: 59 %
Platelets: 264 10*3/uL (ref 150–440)
RBC: 3.19 MIL/uL — ABNORMAL LOW (ref 4.40–5.90)
RDW: 16.3 % — AB (ref 11.5–14.5)
WBC: 6 10*3/uL (ref 3.8–10.6)

## 2016-09-03 LAB — COMPREHENSIVE METABOLIC PANEL
ALBUMIN: 3.3 g/dL — AB (ref 3.5–5.0)
ALT: 16 U/L — ABNORMAL LOW (ref 17–63)
AST: 23 U/L (ref 15–41)
Alkaline Phosphatase: 114 U/L (ref 38–126)
Anion gap: 10 (ref 5–15)
BILIRUBIN TOTAL: 0.7 mg/dL (ref 0.3–1.2)
BUN: 66 mg/dL — ABNORMAL HIGH (ref 6–20)
CO2: 28 mmol/L (ref 22–32)
Calcium: 8.9 mg/dL (ref 8.9–10.3)
Chloride: 97 mmol/L — ABNORMAL LOW (ref 101–111)
Creatinine, Ser: 2.93 mg/dL — ABNORMAL HIGH (ref 0.61–1.24)
GFR calc non Af Amer: 18 mL/min — ABNORMAL LOW (ref >60)
GFR, EST AFRICAN AMERICAN: 21 mL/min — AB (ref >60)
GLUCOSE: 176 mg/dL — AB (ref 65–99)
POTASSIUM: 4 mmol/L (ref 3.5–5.1)
Sodium: 135 mmol/L (ref 135–145)
TOTAL PROTEIN: 6.8 g/dL (ref 6.5–8.1)

## 2016-09-03 LAB — GLUCOSE, CAPILLARY
GLUCOSE-CAPILLARY: 342 mg/dL — AB (ref 65–99)
GLUCOSE-CAPILLARY: 227 mg/dL — AB (ref 65–99)
GLUCOSE-CAPILLARY: 217 mg/dL — AB (ref 65–99)
Glucose-Capillary: 196 mg/dL — ABNORMAL HIGH (ref 65–99)
Glucose-Capillary: 377 mg/dL — ABNORMAL HIGH (ref 65–99)
Glucose-Capillary: 96 mg/dL (ref 65–99)
Glucose-Capillary: 273 mg/dL — ABNORMAL HIGH (ref 65–99)
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
## 2016-09-04 LAB — GLUCOSE, CAPILLARY: GLUCOSE-CAPILLARY: 187 mg/dL — AB (ref 65–99)

## 2016-09-05 ENCOUNTER — Ambulatory Visit: Payer: Medicare Other | Admitting: Cardiovascular Disease

## 2016-09-05 ENCOUNTER — Telehealth: Payer: Self-pay | Admitting: Cardiovascular Disease

## 2016-09-05 ENCOUNTER — Ambulatory Visit: Payer: No Typology Code available for payment source | Attending: Family | Admitting: Family

## 2016-09-05 ENCOUNTER — Encounter: Payer: Self-pay | Admitting: Family

## 2016-09-05 VITALS — BP 127/49 | HR 65 | Resp 18 | Ht 70.0 in | Wt 228.0 lb

## 2016-09-05 DIAGNOSIS — Z794 Long term (current) use of insulin: Secondary | ICD-10-CM | POA: Insufficient documentation

## 2016-09-05 DIAGNOSIS — I2510 Atherosclerotic heart disease of native coronary artery without angina pectoris: Secondary | ICD-10-CM | POA: Insufficient documentation

## 2016-09-05 DIAGNOSIS — Z953 Presence of xenogenic heart valve: Secondary | ICD-10-CM | POA: Insufficient documentation

## 2016-09-05 DIAGNOSIS — E785 Hyperlipidemia, unspecified: Secondary | ICD-10-CM | POA: Insufficient documentation

## 2016-09-05 DIAGNOSIS — I4891 Unspecified atrial fibrillation: Secondary | ICD-10-CM | POA: Insufficient documentation

## 2016-09-05 DIAGNOSIS — I110 Hypertensive heart disease with heart failure: Secondary | ICD-10-CM | POA: Insufficient documentation

## 2016-09-05 DIAGNOSIS — I10 Essential (primary) hypertension: Secondary | ICD-10-CM

## 2016-09-05 DIAGNOSIS — N183 Chronic kidney disease, stage 3 unspecified: Secondary | ICD-10-CM

## 2016-09-05 DIAGNOSIS — E119 Type 2 diabetes mellitus without complications: Secondary | ICD-10-CM | POA: Diagnosis not present

## 2016-09-05 DIAGNOSIS — Z9889 Other specified postprocedural states: Secondary | ICD-10-CM | POA: Insufficient documentation

## 2016-09-05 DIAGNOSIS — Z951 Presence of aortocoronary bypass graft: Secondary | ICD-10-CM | POA: Insufficient documentation

## 2016-09-05 DIAGNOSIS — Z7982 Long term (current) use of aspirin: Secondary | ICD-10-CM | POA: Insufficient documentation

## 2016-09-05 DIAGNOSIS — D638 Anemia in other chronic diseases classified elsewhere: Secondary | ICD-10-CM | POA: Diagnosis not present

## 2016-09-05 DIAGNOSIS — I5032 Chronic diastolic (congestive) heart failure: Secondary | ICD-10-CM | POA: Diagnosis not present

## 2016-09-05 DIAGNOSIS — Z79899 Other long term (current) drug therapy: Secondary | ICD-10-CM | POA: Insufficient documentation

## 2016-09-05 DIAGNOSIS — I482 Chronic atrial fibrillation, unspecified: Secondary | ICD-10-CM

## 2016-09-05 DIAGNOSIS — Z87891 Personal history of nicotine dependence: Secondary | ICD-10-CM | POA: Insufficient documentation

## 2016-09-05 DIAGNOSIS — D631 Anemia in chronic kidney disease: Secondary | ICD-10-CM

## 2016-09-05 DIAGNOSIS — E1142 Type 2 diabetes mellitus with diabetic polyneuropathy: Secondary | ICD-10-CM

## 2016-09-05 LAB — GLUCOSE, CAPILLARY
GLUCOSE-CAPILLARY: 214 mg/dL — AB (ref 65–99)
Glucose-Capillary: 209 mg/dL — ABNORMAL HIGH (ref 65–99)
Glucose-Capillary: 219 mg/dL — ABNORMAL HIGH (ref 65–99)
Glucose-Capillary: 133 mg/dL — ABNORMAL HIGH (ref 65–99)

## 2016-09-05 NOTE — Patient Instructions (Signed)
Continue weighing daily and call for an overnight weight gain of > 2 pounds or a weekly weight gain of >5 pounds. 

## 2016-09-05 NOTE — Telephone Encounter (Signed)
Pt's facility got his appts mixed up that am and took him to see Darylene Price, NP, he arrived here over an hour after his scheduled appt time, so he was rescheduled. He was advised by HF clinic:  - Pt will see cardiologist Rockey Situ) today and spoke with aide about mentioning diuretic and current kidney function  Pt is currently on torsemide 40 mg BID.  [facility] would like Dr. Rockey Situ to review and make any recommendations. Pt to f/u 09/20/16.

## 2016-09-05 NOTE — Telephone Encounter (Signed)
Patient missed appt for today as facility switched CHF clinic and heartcare appointment times.  Patient rs for 4/13 with [facility].  Per Manuela Schwartz care giver patient was dc from hospital recently and [facility] is giving him 40 mg BID of torsemide .  Please check with Gollan and clarify with [facility]  Of dosage .  Patient is in rehab [facility] .
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
## 2016-09-06 LAB — GLUCOSE, CAPILLARY
GLUCOSE-CAPILLARY: 365 mg/dL — AB (ref 65–99)
GLUCOSE-CAPILLARY: 179 mg/dL — AB (ref 65–99)
GLUCOSE-CAPILLARY: 66 mg/dL (ref 65–99)
Glucose-Capillary: 161 mg/dL — ABNORMAL HIGH (ref 65–99)
Glucose-Capillary: 208 mg/dL — ABNORMAL HIGH (ref 65–99)
Glucose-Capillary: 381 mg/dL — ABNORMAL HIGH (ref 65–99)
Glucose-Capillary: 197 mg/dL — ABNORMAL HIGH (ref 65–99)
Glucose-Capillary: 148 mg/dL — ABNORMAL HIGH (ref 65–99)
Glucose-Capillary: 199 mg/dL — ABNORMAL HIGH (ref 65–99)
Glucose-Capillary: 146 mg/dL — ABNORMAL HIGH (ref 65–99)
Glucose-Capillary: 264 mg/dL — ABNORMAL HIGH (ref 65–99)

## 2016-09-08 ENCOUNTER — Encounter
Admission: RE | Admit: 2016-09-08 | Discharge: 2016-09-08 | Disposition: A | Discharge status: Home / Self Care | Payer: Medicare Other | Source: Ambulatory Visit | Attending: Internal Medicine | Admitting: Internal Medicine

## 2016-09-08 LAB — GLUCOSE, CAPILLARY
GLUCOSE-CAPILLARY: 206 mg/dL — AB (ref 65–99)
GLUCOSE-CAPILLARY: 134 mg/dL — AB (ref 65–99)
Glucose-Capillary: 315 mg/dL — ABNORMAL HIGH (ref 65–99)

## 2016-09-09 LAB — GLUCOSE, CAPILLARY
GLUCOSE-CAPILLARY: 295 mg/dL — AB (ref 65–99)
GLUCOSE-CAPILLARY: 72 mg/dL (ref 65–99)
GLUCOSE-CAPILLARY: 401 mg/dL — AB (ref 65–99)
GLUCOSE-CAPILLARY: 468 mg/dL — AB (ref 65–99)
Glucose-Capillary: 352 mg/dL — ABNORMAL HIGH (ref 65–99)
Glucose-Capillary: 35 mg/dL — CL (ref 65–99)
Glucose-Capillary: 267 mg/dL — ABNORMAL HIGH (ref 65–99)
Glucose-Capillary: 438 mg/dL — ABNORMAL HIGH (ref 65–99)
Glucose-Capillary: 108 mg/dL — ABNORMAL HIGH (ref 65–99)
Glucose-Capillary: 51 mg/dL — ABNORMAL LOW (ref 65–99)
Glucose-Capillary: 132 mg/dL — ABNORMAL HIGH (ref 65–99)
Glucose-Capillary: 238 mg/dL — ABNORMAL HIGH (ref 65–99)

## 2016-09-10 LAB — GLUCOSE, CAPILLARY
Glucose-Capillary: 95 mg/dL (ref 65–99)
Glucose-Capillary: 270 mg/dL — ABNORMAL HIGH (ref 65–99)
Glucose-Capillary: 132 mg/dL — ABNORMAL HIGH (ref 65–99)
Glucose-Capillary: 190 mg/dL — ABNORMAL HIGH (ref 65–99)
Glucose-Capillary: 127 mg/dL — ABNORMAL HIGH (ref 65–99)
Glucose-Capillary: 257 mg/dL — ABNORMAL HIGH (ref 65–99)
Glucose-Capillary: 61 mg/dL — ABNORMAL LOW (ref 65–99)

## 2016-09-11 LAB — GLUCOSE, CAPILLARY
GLUCOSE-CAPILLARY: 111 mg/dL — AB (ref 65–99)
GLUCOSE-CAPILLARY: 218 mg/dL — AB (ref 65–99)
GLUCOSE-CAPILLARY: 238 mg/dL — AB (ref 65–99)
Glucose-Capillary: 234 mg/dL — ABNORMAL HIGH (ref 65–99)
Glucose-Capillary: 249 mg/dL — ABNORMAL HIGH (ref 65–99)

## 2016-09-12 ENCOUNTER — Ambulatory Visit: Payer: Medicare Other

## 2016-09-12 ENCOUNTER — Other Ambulatory Visit
Admission: RE | Admit: 2016-09-12 | Discharge: 2016-09-12 | Disposition: A | Discharge status: Home / Self Care | Payer: Medicare Other | Source: Ambulatory Visit | Attending: Gerontology | Admitting: Gerontology

## 2016-09-12 ENCOUNTER — Telehealth: Payer: Self-pay | Admitting: Internal Medicine

## 2016-09-12 DIAGNOSIS — N184 Chronic kidney disease, stage 4 (severe): Secondary | ICD-10-CM | POA: Insufficient documentation

## 2016-09-12 LAB — CBC WITH DIFFERENTIAL/PLATELET
BASOS PCT: 2 %
Basophils Absolute: 0.1 10*3/uL (ref 0–0.1)
Eosinophils Absolute: 0.6 10*3/uL (ref 0–0.7)
Eosinophils Relative: 12 %
HEMATOCRIT: 25.1 % — AB (ref 40.0–52.0)
Hemoglobin: 8.3 g/dL — ABNORMAL LOW (ref 13.0–18.0)
Lymphocytes Relative: 13 %
Lymphs Abs: 0.6 10*3/uL — ABNORMAL LOW (ref 1.0–3.6)
MCH: 27.3 pg (ref 26.0–34.0)
MCHC: 33 g/dL (ref 32.0–36.0)
MCV: 82.9 fL (ref 80.0–100.0)
MONO ABS: 0.7 10*3/uL (ref 0.2–1.0)
Monocytes Relative: 14 %
NEUTROS ABS: 2.9 10*3/uL (ref 1.4–6.5)
NEUTROS PCT: 59 %
PLATELETS: 218 10*3/uL (ref 150–440)
RBC: 3.03 MIL/uL — ABNORMAL LOW (ref 4.40–5.90)
RDW: 16.2 % — ABNORMAL HIGH (ref 11.5–14.5)
WBC: 4.9 10*3/uL (ref 3.8–10.6)

## 2016-09-12 LAB — COMPREHENSIVE METABOLIC PANEL
ALK PHOS: 99 U/L (ref 38–126)
ALT: 13 U/L — AB (ref 17–63)
ANION GAP: 10 (ref 5–15)
AST: 12 U/L — ABNORMAL LOW (ref 15–41)
Albumin: 3.3 g/dL — ABNORMAL LOW (ref 3.5–5.0)
BUN: 67 mg/dL — ABNORMAL HIGH (ref 6–20)
CALCIUM: 8.8 mg/dL — AB (ref 8.9–10.3)
CO2: 29 mmol/L (ref 22–32)
CREATININE: 2.86 mg/dL — AB (ref 0.61–1.24)
Chloride: 97 mmol/L — ABNORMAL LOW (ref 101–111)
GFR, EST AFRICAN AMERICAN: 21 mL/min — AB (ref >60)
GFR, EST NON AFRICAN AMERICAN: 18 mL/min — AB (ref >60)
Glucose, Bld: 222 mg/dL — ABNORMAL HIGH (ref 65–99)
Potassium: 4.3 mmol/L (ref 3.5–5.1)
Sodium: 136 mmol/L (ref 135–145)
Total Bilirubin: 0.6 mg/dL (ref 0.3–1.2)
Total Protein: 6.5 g/dL (ref 6.5–8.1)

## 2016-09-12 LAB — GLUCOSE, CAPILLARY: GLUCOSE-CAPILLARY: 73 mg/dL (ref 65–99)

## 2016-09-12 NOTE — Telephone Encounter (Signed)
Please advise 

## 2016-09-12 NOTE — Telephone Encounter (Signed)
[name] [phone] called from [facility] regarding wanting to know if Dr Derrel Nip is in agreement with pt having hospice care? Please advise? [name] will also fax over a form to be filled out and faxed back. Thank you!

## 2016-09-12 NOTE — Telephone Encounter (Signed)
I have not had that discussion with him so I cannot order it .

## 2016-09-13 ENCOUNTER — Encounter: Payer: Self-pay | Admitting: *Deleted

## 2016-09-13 ENCOUNTER — Ambulatory Visit: Payer: Self-pay | Admitting: *Deleted

## 2016-09-13 NOTE — Telephone Encounter (Signed)
Spoke with Langley Gauss from [facility] and let her know that Dr. Derrel Nip cannot order hospice care for this pt at this time because she has not discussed this with the pt. Explained to [place] that if the pt would like to schedule an appt to discuss this that would be fine. Langley Gauss stated that she would talk to pt's son and let him know about this.

## 2016-09-16 ENCOUNTER — Other Ambulatory Visit: Payer: Self-pay | Admitting: Gerontology

## 2016-09-16 ENCOUNTER — Other Ambulatory Visit: Payer: Self-pay | Admitting: *Deleted

## 2016-09-16 LAB — GLUCOSE, CAPILLARY
GLUCOSE-CAPILLARY: 189 mg/dL — AB (ref 65–99)
Glucose-Capillary: 350 mg/dL — ABNORMAL HIGH (ref 65–99)
Glucose-Capillary: 95 mg/dL (ref 65–99)
Glucose-Capillary: 146 mg/dL — ABNORMAL HIGH (ref 65–99)

## 2016-09-16 NOTE — Patient Outreach (Signed)
[facility]) Care Management  [facility] Social Work  09/16/2016  [name] 1927/12/09 [id number]  Subjective:  Patient is a 83 year male, currently in rehab at [facility]. This social worker spoke with the discharge [name] who stated that she has referred patient for Hospice Care.  Per [name], patient should be discharging home on Friday.  Objective:   Encounter Medications:  Outpatient Encounter Prescriptions as of 09/16/2016  Medication Sig  . acetaminophen (TYLENOL) 325 MG tablet Take 2 tablets (650 mg total) by mouth every 6 (six) hours as needed for mild pain (or Fever >/= 101).  [name] ALPRAZolam (XANAX) 0.25 MG tablet Take 1 tablet (0.25 mg total) by mouth at bedtime as needed for anxiety.  [name] aspirin EC 81 MG tablet Take 1 tablet (81 mg total) by mouth daily.  [name] atorvastatin (LIPITOR) 40 MG tablet Take 1 tablet (40 mg total) by mouth daily at 6 PM.  . azelastine (OPTIVAR) 0.05 % ophthalmic solution Place 1 drop into the right eye 2 (two) times daily.  . calcitRIOL (ROCALTROL) 0.25 MCG capsule TAKE ONE CAPSULE THREE TIMES A WEEK (MONDAY, WEDNESDAY, AND FRIDAY)  . carvedilol (COREG) 6.25 MG tablet Take 1 tablet (6.25 mg total) by mouth 2 (two) times daily.  . cholecalciferol (VITAMIN D) 1000 units tablet Take 1,000 Units by mouth daily.  [name] escitalopram (LEXAPRO) 10 MG tablet TAKE 1 TABLET EVERY DAY WITH DINNER  . ferrous sulfate 325 (65 FE) MG tablet Take 1 tablet (325 mg total) by mouth 2 (two) times daily with a meal.  . HYDROcodone-acetaminophen (NORCO/VICODIN) 5-325 MG tablet Take 1 tablet by mouth daily as needed for moderate pain. DO NOT EXCEED 4GM OF APAP IN 24 HOURS FROM ALL SOURCES  . hydroxychloroquine (PLAQUENIL) 200 MG tablet Take 200 mg by mouth daily.  . insulin aspart (NOVOLOG) 100 UNIT/ML injection Inject 4 Units into the skin 3 (three) times daily before meals.  . insulin glargine (LANTUS) 100 UNIT/ML injection Inject 0.3 mLs (30 Units  total) into the skin daily. (Patient taking differently: Inject 35 Units into the skin daily. )  . isosorbide mononitrate (IMDUR) 30 MG 24 hr tablet Take 30 mg by mouth daily.  [name] loratadine (CLARITIN) 10 MG tablet Take 10 mg by mouth daily.  . Multiple Vitamin (MULTIVITAMIN WITH MINERALS) TABS tablet Take 1 tablet by mouth daily.  . Multiple Vitamins-Minerals (PRESERVISION AREDS 2) CAPS Take 1 capsule by mouth 2 (two) times daily.  . pantoprazole (PROTONIX) 40 MG tablet Take 1 tablet (40 mg total) by mouth daily.  . tamsulosin (FLOMAX) 0.4 MG CAPS capsule TAKE 1 CAPSULE BY MOUTH DAILY AFTER SUPPER  . torsemide (DEMADEX) 20 MG tablet Take 2 tablets (40 mg total) by mouth 2 (two) times daily.  . vitamin C (ASCORBIC ACID) 500 MG tablet Take 500 mg by mouth daily.  [name] zolpidem (AMBIEN) 5 MG tablet Take 5 mg by mouth at bedtime as needed for sleep.   No facility-administered encounter medications on file as of 09/16/2016.     Functional Status:  In your present state of health, do you have any difficulty performing the following activities: 09/05/2016 08/24/2016  Hearing? Tempie Donning  Vision? Y N  Difficulty concentrating or making decisions? N N  Walking or climbing stairs? Y Y  Dressing or bathing? N Y  Doing errands, shopping? Y N  Some recent data might be hidden    Fall/Depression Screening:  PHQ 2/9 Scores 09/05/2016 07/25/2016 04/17/2016 09/13/2015 09/01/2014  07/21/2014  PHQ - 2 Score 0 0 0 0 4 0  PHQ- 9 Score - - - - 13 -    Assessment: Patient referred for Hospice Care by discharge planner.  This social worker will follow up with the discharge planner [name] on Friday to confirm plan for Hospice Care post discharge from the SNF.  Plan: Patient's case to be closed to [facility] care management if plan remains for patient to discharge with Hospice care.     [name] [facility] Care Management [phone]

## 2016-09-16 NOTE — Telephone Encounter (Signed)
This encounter was created in error - please disregard.

## 2016-09-17 ENCOUNTER — Non-Acute Institutional Stay (SKILLED_NURSING_FACILITY): Payer: Medicare Other | Admitting: Gerontology

## 2016-09-17 DIAGNOSIS — I5032 Chronic diastolic (congestive) heart failure: Secondary | ICD-10-CM

## 2016-09-17 LAB — COMPREHENSIVE METABOLIC PANEL
ALBUMIN: 3.2 g/dL — AB (ref 3.5–5.0)
ALK PHOS: 98 U/L (ref 38–126)
ALT: 13 U/L — ABNORMAL LOW (ref 17–63)
ANION GAP: 9 (ref 5–15)
AST: 23 U/L (ref 15–41)
BUN: 65 mg/dL — ABNORMAL HIGH (ref 6–20)
CHLORIDE: 97 mmol/L — AB (ref 101–111)
CO2: 28 mmol/L (ref 22–32)
Calcium: 8.7 mg/dL — ABNORMAL LOW (ref 8.9–10.3)
Creatinine, Ser: 2.75 mg/dL — ABNORMAL HIGH (ref 0.61–1.24)
GFR calc Af Amer: 22 mL/min — ABNORMAL LOW (ref >60)
GFR calc non Af Amer: 19 mL/min — ABNORMAL LOW (ref >60)
GLUCOSE: 194 mg/dL — AB (ref 65–99)
Potassium: 4.2 mmol/L (ref 3.5–5.1)
SODIUM: 134 mmol/L — AB (ref 135–145)
Total Bilirubin: 0.8 mg/dL (ref 0.3–1.2)
Total Protein: 6.3 g/dL — ABNORMAL LOW (ref 6.5–8.1)

## 2016-09-17 LAB — CBC WITH DIFFERENTIAL/PLATELET
BASOS PCT: 1 %
Basophils Absolute: 0.1 10*3/uL (ref 0–0.1)
EOS ABS: 0.6 10*3/uL (ref 0–0.7)
EOS PCT: 14 %
HCT: 25.3 % — ABNORMAL LOW (ref 40.0–52.0)
HEMOGLOBIN: 8.3 g/dL — AB (ref 13.0–18.0)
LYMPHS ABS: 0.8 10*3/uL — AB (ref 1.0–3.6)
LYMPHS PCT: 17 %
MCH: 27.3 pg (ref 26.0–34.0)
MCHC: 32.6 g/dL (ref 32.0–36.0)
MCV: 83.7 fL (ref 80.0–100.0)
Monocytes Absolute: 0.7 10*3/uL (ref 0.2–1.0)
Monocytes Relative: 16 %
Neutro Abs: 2.4 10*3/uL (ref 1.4–6.5)
Neutrophils Relative %: 52 %
PLATELETS: 205 10*3/uL (ref 150–440)
RBC: 3.03 MIL/uL — AB (ref 4.40–5.90)
RDW: 16.7 % — ABNORMAL HIGH (ref 11.5–14.5)
WBC: 4.5 10*3/uL (ref 3.8–10.6)

## 2016-09-17 LAB — GLUCOSE, CAPILLARY
GLUCOSE-CAPILLARY: 272 mg/dL — AB (ref 65–99)
GLUCOSE-CAPILLARY: 211 mg/dL — AB (ref 65–99)
Glucose-Capillary: 189 mg/dL — ABNORMAL HIGH (ref 65–99)

## 2016-09-18 ENCOUNTER — Telehealth: Payer: Self-pay | Admitting: Internal Medicine

## 2016-09-18 LAB — GLUCOSE, CAPILLARY
GLUCOSE-CAPILLARY: 100 mg/dL — AB (ref 65–99)
GLUCOSE-CAPILLARY: 145 mg/dL — AB (ref 65–99)
GLUCOSE-CAPILLARY: 282 mg/dL — AB (ref 65–99)
Glucose-Capillary: 154 mg/dL — ABNORMAL HIGH (ref 65–99)

## 2016-09-18 NOTE — Telephone Encounter (Signed)
I have spoken with [name] son (Dr Location manager ) about the Hospice referral and understand the reason for the referral was to try to manage his heart failure at home, and that [name] is aware of the referral . You can call [facility] and let them know I will sign any orders they have if needed

## 2016-09-19 LAB — GLUCOSE, CAPILLARY
GLUCOSE-CAPILLARY: 247 mg/dL — AB (ref 65–99)
GLUCOSE-CAPILLARY: 333 mg/dL — AB (ref 65–99)
Glucose-Capillary: 176 mg/dL — ABNORMAL HIGH (ref 65–99)

## 2016-09-19 NOTE — Telephone Encounter (Signed)
Spoke with Langley Gauss at [facility] and she stated that the last she had heard from the pt's son was that the pt was going to be discharged with [facility]. Langley Gauss stated that she would contact the son again to see if he is still wanting the hospice care before she sends over orders.

## 2016-09-19 NOTE — Telephone Encounter (Signed)
[name] from [place] called and asked for you to return her call. Thak you!  Call [facility] @ [phone]

## 2016-09-19 NOTE — Telephone Encounter (Signed)
Please call [name]   ,  Thanks
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
## 2016-09-19 NOTE — Telephone Encounter (Signed)
I must have sent this to you by accident. I called and spoke with Langley Gauss and she stated that she had spoken with the son again. The son stated that he would rather do the Hospice care than the [facility] home care. Murriel Hopper the verbal okay for any orders that are needed and she stated that she would fax over some forms as well to have signed.

## 2016-09-20 ENCOUNTER — Other Ambulatory Visit: Payer: Self-pay | Admitting: *Deleted

## 2016-09-20 ENCOUNTER — Encounter: Payer: Self-pay | Admitting: *Deleted

## 2016-09-20 ENCOUNTER — Ambulatory Visit: Payer: Medicare Other | Admitting: Cardiovascular Disease

## 2016-09-20 ENCOUNTER — Other Ambulatory Visit: Payer: Self-pay | Admitting: Internal Medicine

## 2016-09-20 NOTE — Patient Outreach (Signed)
[facility]) Care Management  09/20/2016  [name] 1927-11-09 [id number]   Chart reviewed. Patient's son has agreed to [facility]. Patient's provider has given the verbal okay and will follow up with any written orders needed.  Patient will not be opened to [facility] care management as patient has transitioned to [facility]. Patient's provider's office and RNCM to be notified of case closure.   [name] [facility] Care Management [phone]

## 2016-09-20 NOTE — Telephone Encounter (Signed)
Refilled,.  Her's a little early but ok to fax

## 2016-09-20 NOTE — Telephone Encounter (Signed)
Refilled: 08/29/2016 Last OV: 07/29/2016 Next OV: not scheduled
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
## 2016-09-21 DIAGNOSIS — I4891 Unspecified atrial fibrillation: Secondary | ICD-10-CM | POA: Diagnosis not present

## 2016-09-21 DIAGNOSIS — I5030 Unspecified diastolic (congestive) heart failure: Secondary | ICD-10-CM | POA: Diagnosis not present

## 2016-09-21 DIAGNOSIS — Z7982 Long term (current) use of aspirin: Secondary | ICD-10-CM | POA: Diagnosis not present

## 2016-09-21 DIAGNOSIS — E785 Hyperlipidemia, unspecified: Secondary | ICD-10-CM | POA: Diagnosis not present

## 2016-09-21 DIAGNOSIS — E1122 Type 2 diabetes mellitus with diabetic chronic kidney disease: Secondary | ICD-10-CM | POA: Diagnosis not present

## 2016-09-21 DIAGNOSIS — K922 Gastrointestinal hemorrhage, unspecified: Secondary | ICD-10-CM | POA: Diagnosis not present

## 2016-09-21 DIAGNOSIS — I2510 Atherosclerotic heart disease of native coronary artery without angina pectoris: Secondary | ICD-10-CM | POA: Diagnosis not present

## 2016-09-21 DIAGNOSIS — N189 Chronic kidney disease, unspecified: Secondary | ICD-10-CM | POA: Diagnosis not present

## 2016-09-21 DIAGNOSIS — Z794 Long term (current) use of insulin: Secondary | ICD-10-CM | POA: Diagnosis not present

## 2016-09-21 DIAGNOSIS — I272 Pulmonary hypertension, unspecified: Secondary | ICD-10-CM | POA: Diagnosis not present

## 2016-09-21 LAB — GLUCOSE, CAPILLARY
GLUCOSE-CAPILLARY: 143 mg/dL — AB (ref 65–99)
GLUCOSE-CAPILLARY: 201 mg/dL — AB (ref 65–99)
GLUCOSE-CAPILLARY: 205 mg/dL — AB (ref 65–99)
Glucose-Capillary: 244 mg/dL — ABNORMAL HIGH (ref 65–99)

## 2016-09-23 ENCOUNTER — Encounter: Payer: Self-pay | Admitting: Cardiovascular Disease

## 2016-09-23 ENCOUNTER — Telehealth: Payer: Self-pay

## 2016-09-23 ENCOUNTER — Encounter: Payer: Self-pay | Admitting: Hematology and Oncology

## 2016-09-23 ENCOUNTER — Inpatient Hospital Stay: Attending: Hematology and Oncology | Admitting: Hematology and Oncology

## 2016-09-23 ENCOUNTER — Inpatient Hospital Stay

## 2016-09-23 VITALS — BP 144/71 | HR 62 | Temp 96.5°F | Resp 18 | Wt 235.2 lb

## 2016-09-23 DIAGNOSIS — D631 Anemia in chronic kidney disease: Secondary | ICD-10-CM

## 2016-09-23 DIAGNOSIS — Z8673 Personal history of transient ischemic attack (TIA), and cerebral infarction without residual deficits: Secondary | ICD-10-CM | POA: Insufficient documentation

## 2016-09-23 DIAGNOSIS — I129 Hypertensive chronic kidney disease with stage 1 through stage 4 chronic kidney disease, or unspecified chronic kidney disease: Secondary | ICD-10-CM | POA: Diagnosis not present

## 2016-09-23 DIAGNOSIS — M1990 Unspecified osteoarthritis, unspecified site: Secondary | ICD-10-CM | POA: Diagnosis not present

## 2016-09-23 DIAGNOSIS — D500 Iron deficiency anemia secondary to blood loss (chronic): Secondary | ICD-10-CM

## 2016-09-23 DIAGNOSIS — Z8719 Personal history of other diseases of the digestive system: Secondary | ICD-10-CM | POA: Diagnosis not present

## 2016-09-23 DIAGNOSIS — I2510 Atherosclerotic heart disease of native coronary artery without angina pectoris: Secondary | ICD-10-CM | POA: Diagnosis not present

## 2016-09-23 DIAGNOSIS — Z7982 Long term (current) use of aspirin: Secondary | ICD-10-CM | POA: Insufficient documentation

## 2016-09-23 DIAGNOSIS — I482 Chronic atrial fibrillation: Secondary | ICD-10-CM | POA: Diagnosis not present

## 2016-09-23 DIAGNOSIS — N189 Chronic kidney disease, unspecified: Secondary | ICD-10-CM | POA: Insufficient documentation

## 2016-09-23 DIAGNOSIS — E1122 Type 2 diabetes mellitus with diabetic chronic kidney disease: Secondary | ICD-10-CM | POA: Insufficient documentation

## 2016-09-23 DIAGNOSIS — Z87891 Personal history of nicotine dependence: Secondary | ICD-10-CM | POA: Diagnosis not present

## 2016-09-23 DIAGNOSIS — I509 Heart failure, unspecified: Secondary | ICD-10-CM | POA: Insufficient documentation

## 2016-09-23 DIAGNOSIS — E785 Hyperlipidemia, unspecified: Secondary | ICD-10-CM | POA: Diagnosis not present

## 2016-09-23 DIAGNOSIS — Z794 Long term (current) use of insulin: Secondary | ICD-10-CM | POA: Insufficient documentation

## 2016-09-23 DIAGNOSIS — N184 Chronic kidney disease, stage 4 (severe): Principal | ICD-10-CM

## 2016-09-23 DIAGNOSIS — J811 Chronic pulmonary edema: Secondary | ICD-10-CM | POA: Insufficient documentation

## 2016-09-23 DIAGNOSIS — Z79899 Other long term (current) drug therapy: Secondary | ICD-10-CM | POA: Diagnosis not present

## 2016-09-23 DIAGNOSIS — N183 Chronic kidney disease, stage 3 unspecified: Secondary | ICD-10-CM

## 2016-09-23 DIAGNOSIS — K921 Melena: Secondary | ICD-10-CM | POA: Diagnosis not present

## 2016-09-23 DIAGNOSIS — N400 Enlarged prostate without lower urinary tract symptoms: Secondary | ICD-10-CM | POA: Diagnosis not present

## 2016-09-23 DIAGNOSIS — Z8551 Personal history of malignant neoplasm of bladder: Secondary | ICD-10-CM | POA: Insufficient documentation

## 2016-09-23 DIAGNOSIS — R001 Bradycardia, unspecified: Secondary | ICD-10-CM | POA: Insufficient documentation

## 2016-09-23 DIAGNOSIS — F0390 Unspecified dementia without behavioral disturbance: Secondary | ICD-10-CM | POA: Insufficient documentation

## 2016-09-23 LAB — COMPREHENSIVE METABOLIC PANEL
ALT: 14 U/L — ABNORMAL LOW (ref 17–63)
AST: 25 U/L (ref 15–41)
Albumin: 3.6 g/dL (ref 3.5–5.0)
Alkaline Phosphatase: 105 U/L (ref 38–126)
Anion gap: 9 (ref 5–15)
BUN: 81 mg/dL — ABNORMAL HIGH (ref 6–20)
CO2: 23 mmol/L (ref 22–32)
Calcium: 8.6 mg/dL — ABNORMAL LOW (ref 8.9–10.3)
Chloride: 95 mmol/L — ABNORMAL LOW (ref 101–111)
Creatinine, Ser: 2.94 mg/dL — ABNORMAL HIGH (ref 0.61–1.24)
GFR calc Af Amer: 21 mL/min — ABNORMAL LOW (ref >60)
GFR calc non Af Amer: 18 mL/min — ABNORMAL LOW (ref >60)
Glucose, Bld: 397 mg/dL — ABNORMAL HIGH (ref 65–99)
Potassium: 4.2 mmol/L (ref 3.5–5.1)
Sodium: 127 mmol/L — ABNORMAL LOW (ref 135–145)
Total Bilirubin: 0.9 mg/dL (ref 0.3–1.2)
Total Protein: 6.8 g/dL (ref 6.5–8.1)

## 2016-09-23 LAB — CBC WITH DIFFERENTIAL/PLATELET
Basophils Absolute: 0.1 10*3/uL (ref 0–0.1)
Basophils Relative: 1 %
Eosinophils Absolute: 0.4 10*3/uL (ref 0–0.7)
Eosinophils Relative: 8 %
HCT: 24.9 % — ABNORMAL LOW (ref 40.0–52.0)
Hemoglobin: 8.2 g/dL — ABNORMAL LOW (ref 13.0–18.0)
Lymphocytes Relative: 10 %
Lymphs Abs: 0.5 10*3/uL — ABNORMAL LOW (ref 1.0–3.6)
MCH: 27.6 pg (ref 26.0–34.0)
MCHC: 32.8 g/dL (ref 32.0–36.0)
MCV: 84 fL (ref 80.0–100.0)
Monocytes Absolute: 0.7 10*3/uL (ref 0.2–1.0)
Monocytes Relative: 14 %
Neutro Abs: 3.3 10*3/uL (ref 1.4–6.5)
Neutrophils Relative %: 67 %
Platelets: 179 10*3/uL (ref 150–440)
RBC: 2.97 MIL/uL — ABNORMAL LOW (ref 4.40–5.90)
RDW: 16.5 % — ABNORMAL HIGH (ref 11.5–14.5)
WBC: 5 10*3/uL (ref 3.8–10.6)

## 2016-09-23 LAB — SAMPLE TO BLOOD BANK

## 2016-09-23 LAB — FERRITIN: Ferritin: 91 ng/mL (ref 24–336)

## 2016-09-23 MED ORDER — EPOETIN ALFA 10000 UNIT/ML IJ SOLN
10000.0000 [IU] | Freq: Once | INTRAMUSCULAR | Status: AC
  Administered 2016-09-23: 10000 [IU] via SUBCUTANEOUS
  Filled 2016-09-23: qty 2

## 2016-09-23 MED ORDER — ZOLPIDEM TARTRATE 5 MG PO TABS: 5.0000 mg | ORAL_TABLET | Freq: Every evening | ORAL | 5 refills | Status: AC | PRN

## 2016-09-23 NOTE — Progress Notes (Signed)
Patient offers no complaints today. 

## 2016-09-23 NOTE — Telephone Encounter (Signed)
Received a fax from the pharmacy stating that they need a rx for the zolpidem 5mg . The pharmacist stated that the medication was listed on the pt's discharge paperwork but they didn't send a rx.
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
## 2016-09-23 NOTE — Telephone Encounter (Signed)
authorizedd and printed

## 2016-09-24 ENCOUNTER — Other Ambulatory Visit: Payer: Self-pay | Admitting: Internal Medicine

## 2016-09-24 ENCOUNTER — Telehealth: Payer: Self-pay | Admitting: *Deleted

## 2016-09-24 DIAGNOSIS — I110 Hypertensive heart disease with heart failure: Secondary | ICD-10-CM

## 2016-09-24 DIAGNOSIS — I5030 Unspecified diastolic (congestive) heart failure: Secondary | ICD-10-CM | POA: Diagnosis not present

## 2016-09-24 DIAGNOSIS — E1122 Type 2 diabetes mellitus with diabetic chronic kidney disease: Secondary | ICD-10-CM | POA: Diagnosis not present

## 2016-09-24 DIAGNOSIS — I2510 Atherosclerotic heart disease of native coronary artery without angina pectoris: Secondary | ICD-10-CM | POA: Diagnosis not present

## 2016-09-24 DIAGNOSIS — I4891 Unspecified atrial fibrillation: Secondary | ICD-10-CM | POA: Diagnosis not present

## 2016-09-24 DIAGNOSIS — N189 Chronic kidney disease, unspecified: Secondary | ICD-10-CM | POA: Diagnosis not present

## 2016-09-24 DIAGNOSIS — I5022 Chronic systolic (congestive) heart failure: Principal | ICD-10-CM

## 2016-09-24 DIAGNOSIS — I272 Pulmonary hypertension, unspecified: Secondary | ICD-10-CM | POA: Diagnosis not present

## 2016-09-24 DIAGNOSIS — D638 Anemia in other chronic diseases classified elsewhere: Secondary | ICD-10-CM

## 2016-09-24 LAB — GLUCOSE, CAPILLARY
GLUCOSE-CAPILLARY: 191 mg/dL — AB (ref 65–99)
GLUCOSE-CAPILLARY: 254 mg/dL — AB (ref 65–99)
GLUCOSE-CAPILLARY: 56 mg/dL — AB (ref 65–99)
GLUCOSE-CAPILLARY: 221 mg/dL — AB (ref 65–99)
GLUCOSE-CAPILLARY: 269 mg/dL — AB (ref 65–99)
GLUCOSE-CAPILLARY: 286 mg/dL — AB (ref 65–99)
Glucose-Capillary: 236 mg/dL — ABNORMAL HIGH (ref 65–99)
Glucose-Capillary: 148 mg/dL — ABNORMAL HIGH (ref 65–99)
Glucose-Capillary: 230 mg/dL — ABNORMAL HIGH (ref 65–99)
Glucose-Capillary: 164 mg/dL — ABNORMAL HIGH (ref 65–99)
Glucose-Capillary: 81 mg/dL (ref 65–99)
Glucose-Capillary: 85 mg/dL (ref 65–99)
Glucose-Capillary: 200 mg/dL — ABNORMAL HIGH (ref 65–99)

## 2016-09-24 NOTE — Telephone Encounter (Signed)
Patients caregiver reported that the hospice social worker explained to patient that the hospice services were patients with 6-12 months to live. Patient is now down and out thinking that he has 6 months to live. Caregiver requested to have the provider call the patient to explain that he's not dying.  Contact Pt [phone]

## 2016-09-24 NOTE — Telephone Encounter (Signed)
[name] called and states that pt is taking the Ambien. Please advise, thank you!

## 2016-09-24 NOTE — Telephone Encounter (Signed)
Spoke with pt's caregiver and she stated that the pt is no longer taking the xanax because it did not work for him.

## 2016-09-24 NOTE — Telephone Encounter (Signed)
Signed and fax

## 2016-09-24 NOTE — Telephone Encounter (Signed)
Medication removed from list.  Ok to fax ambien rx.

## 2016-09-25 NOTE — Telephone Encounter (Signed)
FYI

## 2016-09-25 NOTE — Telephone Encounter (Signed)
Last filled 07/30/16 Last office visit 07/16/16 No office visit scheduled

## 2016-09-25 NOTE — Telephone Encounter (Signed)
Faxed

## 2016-09-25 NOTE — Telephone Encounter (Signed)
Spoke with caregiver,  Patient's son requested Hospice  for home management of symptoms to reduce hospitalizations, NOT TO WITHDRAW CARE OR TELL PATIENT HE WAS DYING.  PLEASE CANCEL HOSPICE AND ASK [facility] TO SEE PATIENT.

## 2016-09-25 NOTE — Telephone Encounter (Signed)
Denied,  Not effective per patient

## 2016-09-25 NOTE — Telephone Encounter (Signed)
Pt caregiver called stating that Dr Derrel Nip wanted to know the name of the social worker is Louretta Shorten. Thank you!

## 2016-09-25 NOTE — Telephone Encounter (Signed)
Please advise 

## 2016-09-26 ENCOUNTER — Telehealth: Payer: Self-pay | Admitting: Internal Medicine

## 2016-09-26 NOTE — Telephone Encounter (Signed)
This is correct .  Dc hospice,  You can teel rhonda that the social worker upset the patient

## 2016-09-26 NOTE — Telephone Encounter (Signed)
Notified hospice, thanks

## 2016-09-26 NOTE — Telephone Encounter (Signed)
I saw your note yesterday discontinuing Hosp[facility] care and getting [facility], I wanted to clarify that this is correct prior to calling her. thanks

## 2016-09-26 NOTE — Telephone Encounter (Signed)
Rhonda from [facility] and [facility] care of [place] called and stated that pt has been recently discharged and would like to continue palliative care. Please advise, thank you!  Call [facility] @ [phone]

## 2016-09-27 NOTE — Telephone Encounter (Signed)
What specific orders are needed, I see the original evaluate and order for CHF and anemia.

## 2016-09-27 NOTE — Telephone Encounter (Signed)
PT evaluation  for home PT   Nursing for chf protocol and administer iv lasix for wt gain of 2 lbs overnight . D they have a protocol

## 2016-09-27 NOTE — Telephone Encounter (Signed)
Manuela Schwartz (caregiver) called and stated that [facility] called and needs new orders. Please advise, thank you!

## 2016-09-30 NOTE — Telephone Encounter (Signed)
[name] came in office to check on the status of the [facility] referral.. Please advise [name] at [phone]

## 2016-10-01 ENCOUNTER — Telehealth: Payer: Self-pay | Admitting: Internal Medicine

## 2016-10-01 NOTE — Telephone Encounter (Signed)
Juliann Pulse please call home health . Message unclear

## 2016-10-01 NOTE — Telephone Encounter (Signed)
Shonda [phone] called from [facility] regarding wanting to know if Dr Derrel Nip is needing there home health protocol when it comes to Lasix. Due to not having the amount of lasix to use. Please advise? Thank you!

## 2016-10-01 NOTE — Telephone Encounter (Signed)
Talked with Hospice and patient has ben D/C from Hospice but was admitted on 09/27/16 back to Palliative Care by someone from this office per Hospice director  Brien Mates ,  She is to call back and let me know whom these orders came from . That is why [facility] would not take patient because the order for Palliative care has not been D/C. This note was written at time of call from Hospice.

## 2016-10-01 NOTE — Telephone Encounter (Signed)
Please d/c Hospice  services

## 2016-10-01 NOTE — Telephone Encounter (Signed)
-----   Message from [place] sent at 09/30/2016  3:14 PM EDT ----- Contact: [phone] Good Afternoon,  I had the director of [facility] call. She wanted to follow up with you in regards to Mr. Ladd and what there protocols are in regards to this situation. Please advise, thank you!

## 2016-10-01 NOTE — Telephone Encounter (Signed)
Then cancel the Palliative care referral if that is not allowed either.

## 2016-10-01 NOTE — Telephone Encounter (Signed)
Called [facility] on 4/23 to follow up on referral that was faxed over. They said that the last order that they had was active and then canceled on the same day d/t pt having hospice. I told them that he no longer has hospice and needs to be seen by [facility]. Refaxed the referral d/t to them denying the referral. Spoke to [facility] again this morning (4/24) for follow up. They said once again that he was under hospice. I told them once again that he was discharged from Hospice and needs to be seen as soon as possible d/t his CHF and weight gain. She said that she will get his referral reviewed so they can start care.

## 2016-10-02 ENCOUNTER — Telehealth: Payer: Self-pay | Admitting: *Deleted

## 2016-10-02 ENCOUNTER — Telehealth: Payer: Self-pay | Admitting: Internal Medicine

## 2016-10-02 DIAGNOSIS — M1990 Unspecified osteoarthritis, unspecified site: Secondary | ICD-10-CM | POA: Diagnosis not present

## 2016-10-02 DIAGNOSIS — I5032 Chronic diastolic (congestive) heart failure: Secondary | ICD-10-CM | POA: Diagnosis not present

## 2016-10-02 DIAGNOSIS — Z952 Presence of prosthetic heart valve: Secondary | ICD-10-CM | POA: Diagnosis not present

## 2016-10-02 DIAGNOSIS — Z7982 Long term (current) use of aspirin: Secondary | ICD-10-CM | POA: Diagnosis not present

## 2016-10-02 DIAGNOSIS — N183 Chronic kidney disease, stage 3 (moderate): Secondary | ICD-10-CM | POA: Diagnosis not present

## 2016-10-02 DIAGNOSIS — I2510 Atherosclerotic heart disease of native coronary artery without angina pectoris: Secondary | ICD-10-CM | POA: Diagnosis not present

## 2016-10-02 DIAGNOSIS — I130 Hypertensive heart and chronic kidney disease with heart failure and stage 1 through stage 4 chronic kidney disease, or unspecified chronic kidney disease: Secondary | ICD-10-CM | POA: Diagnosis not present

## 2016-10-02 DIAGNOSIS — H3530 Unspecified macular degeneration: Secondary | ICD-10-CM | POA: Diagnosis not present

## 2016-10-02 DIAGNOSIS — E1142 Type 2 diabetes mellitus with diabetic polyneuropathy: Secondary | ICD-10-CM | POA: Diagnosis not present

## 2016-10-02 DIAGNOSIS — D631 Anemia in chronic kidney disease: Secondary | ICD-10-CM | POA: Diagnosis not present

## 2016-10-02 DIAGNOSIS — I482 Chronic atrial fibrillation: Secondary | ICD-10-CM | POA: Diagnosis not present

## 2016-10-02 DIAGNOSIS — Z8551 Personal history of malignant neoplasm of bladder: Secondary | ICD-10-CM | POA: Diagnosis not present

## 2016-10-02 DIAGNOSIS — Z794 Long term (current) use of insulin: Secondary | ICD-10-CM | POA: Diagnosis not present

## 2016-10-02 DIAGNOSIS — Z87891 Personal history of nicotine dependence: Secondary | ICD-10-CM | POA: Diagnosis not present

## 2016-10-02 DIAGNOSIS — L89310 Pressure ulcer of right buttock, unstageable: Secondary | ICD-10-CM | POA: Diagnosis not present

## 2016-10-02 DIAGNOSIS — E1122 Type 2 diabetes mellitus with diabetic chronic kidney disease: Secondary | ICD-10-CM | POA: Diagnosis not present

## 2016-10-02 DIAGNOSIS — I69354 Hemiplegia and hemiparesis following cerebral infarction affecting left non-dominant side: Secondary | ICD-10-CM | POA: Diagnosis not present

## 2016-10-02 DIAGNOSIS — Z951 Presence of aortocoronary bypass graft: Secondary | ICD-10-CM | POA: Diagnosis not present

## 2016-10-02 NOTE — Telephone Encounter (Signed)
Opened in error

## 2016-10-02 NOTE — Telephone Encounter (Signed)
Palliative was D/C and patient is to have [facility] , I know  Dr. Derrel Nip would give verbal OT and PT , but I am waiting for nurse from [facility] to return my call as to the protocol they may have for CHF and Lasix if weight gain of 2 pounds are more. Gave verbal to [place] for [facility] and OT already she is to call back if they have a protocol for Lasix for CHF.

## 2016-10-02 NOTE — Telephone Encounter (Signed)
Left message for [name] from [facility] to give call back to office.

## 2016-10-02 NOTE — Telephone Encounter (Signed)
Mendel Ryder [phone] called from [facility] regarding pt having OT and PT added into pt home health for 60 days. Please advise? VM can be left. Thank you!

## 2016-10-02 NOTE — Telephone Encounter (Signed)
Palliative Care stated that -Palliative care can co-insist   with [facility], considering palliative care is not part of hospice  Contact [phone]

## 2016-10-02 NOTE — Telephone Encounter (Signed)
[name] what was decided yesterday for him?

## 2016-10-04 NOTE — Telephone Encounter (Signed)
Letter faxed to Hospice to cancel palliative care.

## 2016-10-05 ENCOUNTER — Other Ambulatory Visit: Payer: Self-pay

## 2016-10-05 ENCOUNTER — Encounter: Payer: Self-pay | Admitting: Emergency Medicine

## 2016-10-05 ENCOUNTER — Inpatient Hospital Stay
Admission: EM | Admit: 2016-10-05 | Discharge: 2016-10-07 | DRG: 291 | Disposition: A | Discharge status: Home Health | Payer: Medicare Other | Attending: Internal Medicine | Admitting: Internal Medicine

## 2016-10-05 ENCOUNTER — Emergency Department: Payer: Medicare Other

## 2016-10-05 DIAGNOSIS — I110 Hypertensive heart disease with heart failure: Secondary | ICD-10-CM | POA: Diagnosis not present

## 2016-10-05 DIAGNOSIS — Z7982 Long term (current) use of aspirin: Secondary | ICD-10-CM | POA: Diagnosis not present

## 2016-10-05 DIAGNOSIS — Z953 Presence of xenogenic heart valve: Secondary | ICD-10-CM | POA: Diagnosis not present

## 2016-10-05 DIAGNOSIS — Z794 Long term (current) use of insulin: Secondary | ICD-10-CM | POA: Diagnosis not present

## 2016-10-05 DIAGNOSIS — D631 Anemia in chronic kidney disease: Secondary | ICD-10-CM | POA: Diagnosis present

## 2016-10-05 DIAGNOSIS — K922 Gastrointestinal hemorrhage, unspecified: Secondary | ICD-10-CM

## 2016-10-05 DIAGNOSIS — R0602 Shortness of breath: Secondary | ICD-10-CM | POA: Diagnosis not present

## 2016-10-05 DIAGNOSIS — Z833 Family history of diabetes mellitus: Secondary | ICD-10-CM

## 2016-10-05 DIAGNOSIS — Z8551 Personal history of malignant neoplasm of bladder: Secondary | ICD-10-CM | POA: Diagnosis not present

## 2016-10-05 DIAGNOSIS — D649 Anemia, unspecified: Secondary | ICD-10-CM | POA: Diagnosis not present

## 2016-10-05 DIAGNOSIS — I481 Persistent atrial fibrillation: Secondary | ICD-10-CM | POA: Diagnosis present

## 2016-10-05 DIAGNOSIS — I130 Hypertensive heart and chronic kidney disease with heart failure and stage 1 through stage 4 chronic kidney disease, or unspecified chronic kidney disease: Principal | ICD-10-CM | POA: Diagnosis present

## 2016-10-05 DIAGNOSIS — D62 Acute posthemorrhagic anemia: Secondary | ICD-10-CM

## 2016-10-05 DIAGNOSIS — N184 Chronic kidney disease, stage 4 (severe): Secondary | ICD-10-CM | POA: Diagnosis present

## 2016-10-05 DIAGNOSIS — K921 Melena: Secondary | ICD-10-CM | POA: Diagnosis present

## 2016-10-05 DIAGNOSIS — I482 Chronic atrial fibrillation: Secondary | ICD-10-CM | POA: Diagnosis present

## 2016-10-05 DIAGNOSIS — Z87891 Personal history of nicotine dependence: Secondary | ICD-10-CM

## 2016-10-05 DIAGNOSIS — Z951 Presence of aortocoronary bypass graft: Secondary | ICD-10-CM | POA: Diagnosis not present

## 2016-10-05 DIAGNOSIS — I129 Hypertensive chronic kidney disease with stage 1 through stage 4 chronic kidney disease, or unspecified chronic kidney disease: Secondary | ICD-10-CM | POA: Diagnosis not present

## 2016-10-05 DIAGNOSIS — I2510 Atherosclerotic heart disease of native coronary artery without angina pectoris: Secondary | ICD-10-CM | POA: Diagnosis present

## 2016-10-05 DIAGNOSIS — R0603 Acute respiratory distress: Secondary | ICD-10-CM | POA: Diagnosis present

## 2016-10-05 DIAGNOSIS — Z8042 Family history of malignant neoplasm of prostate: Secondary | ICD-10-CM

## 2016-10-05 DIAGNOSIS — L89152 Pressure ulcer of sacral region, stage 2: Secondary | ICD-10-CM | POA: Diagnosis present

## 2016-10-05 DIAGNOSIS — E871 Hypo-osmolality and hyponatremia: Secondary | ICD-10-CM | POA: Diagnosis not present

## 2016-10-05 DIAGNOSIS — Z7901 Long term (current) use of anticoagulants: Secondary | ICD-10-CM | POA: Diagnosis not present

## 2016-10-05 DIAGNOSIS — M069 Rheumatoid arthritis, unspecified: Secondary | ICD-10-CM | POA: Diagnosis present

## 2016-10-05 DIAGNOSIS — E1142 Type 2 diabetes mellitus with diabetic polyneuropathy: Secondary | ICD-10-CM | POA: Diagnosis present

## 2016-10-05 DIAGNOSIS — Z823 Family history of stroke: Secondary | ICD-10-CM

## 2016-10-05 DIAGNOSIS — N179 Acute kidney failure, unspecified: Secondary | ICD-10-CM | POA: Diagnosis present

## 2016-10-05 DIAGNOSIS — R0600 Dyspnea, unspecified: Secondary | ICD-10-CM | POA: Diagnosis not present

## 2016-10-05 DIAGNOSIS — I272 Pulmonary hypertension, unspecified: Secondary | ICD-10-CM | POA: Diagnosis present

## 2016-10-05 DIAGNOSIS — E785 Hyperlipidemia, unspecified: Secondary | ICD-10-CM | POA: Diagnosis present

## 2016-10-05 DIAGNOSIS — I5031 Acute diastolic (congestive) heart failure: Secondary | ICD-10-CM | POA: Diagnosis not present

## 2016-10-05 DIAGNOSIS — I5033 Acute on chronic diastolic (congestive) heart failure: Secondary | ICD-10-CM | POA: Diagnosis not present

## 2016-10-05 DIAGNOSIS — I69339 Monoplegia of upper limb following cerebral infarction affecting unspecified side: Secondary | ICD-10-CM

## 2016-10-05 DIAGNOSIS — N400 Enlarged prostate without lower urinary tract symptoms: Secondary | ICD-10-CM | POA: Diagnosis present

## 2016-10-05 DIAGNOSIS — E1122 Type 2 diabetes mellitus with diabetic chronic kidney disease: Secondary | ICD-10-CM | POA: Diagnosis present

## 2016-10-05 LAB — CBC
HCT: 23.3 % — ABNORMAL LOW (ref 40.0–52.0)
HEMOGLOBIN: 7.7 g/dL — AB (ref 13.0–18.0)
MCH: 28.3 pg (ref 26.0–34.0)
MCHC: 33 g/dL (ref 32.0–36.0)
MCV: 85.7 fL (ref 80.0–100.0)
Platelets: 193 10*3/uL (ref 150–440)
RBC: 2.72 MIL/uL — ABNORMAL LOW (ref 4.40–5.90)
RDW: 17.7 % — AB (ref 11.5–14.5)
WBC: 4.8 10*3/uL (ref 3.8–10.6)

## 2016-10-05 LAB — BASIC METABOLIC PANEL
ANION GAP: 11 (ref 5–15)
BUN: 77 mg/dL — AB (ref 6–20)
CALCIUM: 8.9 mg/dL (ref 8.9–10.3)
CO2: 24 mmol/L (ref 22–32)
Chloride: 99 mmol/L — ABNORMAL LOW (ref 101–111)
Creatinine, Ser: 3.1 mg/dL — ABNORMAL HIGH (ref 0.61–1.24)
GFR calc Af Amer: 19 mL/min — ABNORMAL LOW (ref >60)
GFR, EST NON AFRICAN AMERICAN: 17 mL/min — AB (ref >60)
Glucose, Bld: 140 mg/dL — ABNORMAL HIGH (ref 65–99)
POTASSIUM: 4.3 mmol/L (ref 3.5–5.1)
SODIUM: 134 mmol/L — AB (ref 135–145)

## 2016-10-05 LAB — PREPARE RBC (CROSSMATCH)

## 2016-10-05 LAB — GLUCOSE, CAPILLARY: Glucose-Capillary: 166 mg/dL — ABNORMAL HIGH (ref 65–99)

## 2016-10-05 LAB — TROPONIN I

## 2016-10-05 MED ORDER — TAMSULOSIN HCL 0.4 MG PO CAPS
0.4000 mg | ORAL_CAPSULE | Freq: Every day | ORAL | Status: DC
Start: 2016-10-06 — End: 2016-10-07
  Administered 2016-10-06 – 2016-10-07 (×2): 0.4 mg via ORAL
  Filled 2016-10-05 (×2): qty 1

## 2016-10-05 MED ORDER — ZOLPIDEM TARTRATE 5 MG PO TABS: 5.0000 mg | ORAL_TABLET | Freq: Every evening | ORAL | Status: DC | PRN

## 2016-10-05 MED ORDER — KETOTIFEN FUMARATE 0.025 % OP SOLN
1.0000 [drp] | Freq: Two times a day (BID) | OPHTHALMIC | Status: DC
  Administered 2016-10-06 – 2016-10-07 (×4): 1 [drp] via OPHTHALMIC
  Filled 2016-10-05 (×2): qty 5

## 2016-10-05 MED ORDER — PRESERVISION AREDS 2 PO CAPS: 1.0000 | ORAL_CAPSULE | Freq: Two times a day (BID) | ORAL | Status: DC

## 2016-10-05 MED ORDER — INSULIN GLARGINE 100 UNIT/ML ~~LOC~~ SOLN
30.0000 [IU] | Freq: Every day | SUBCUTANEOUS | Status: DC
  Administered 2016-10-06 – 2016-10-07 (×2): 30 [IU] via SUBCUTANEOUS
  Filled 2016-10-05 (×2): qty 0.3

## 2016-10-05 MED ORDER — SODIUM CHLORIDE 0.9% FLUSH
3.0000 mL | Freq: Two times a day (BID) | INTRAVENOUS | Status: DC
  Administered 2016-10-05 – 2016-10-07 (×4): 3 mL via INTRAVENOUS

## 2016-10-05 MED ORDER — ADULT MULTIVITAMIN W/MINERALS CH
1.0000 | ORAL_TABLET | Freq: Every day | ORAL | Status: DC
  Administered 2016-10-06 – 2016-10-07 (×2): 1 via ORAL
  Filled 2016-10-05 (×2): qty 1

## 2016-10-05 MED ORDER — ACETAMINOPHEN 325 MG PO TABS
650.0000 mg | ORAL_TABLET | Freq: Four times a day (QID) | ORAL | Status: DC | PRN
  Administered 2016-10-06 (×2): 650 mg via ORAL
  Filled 2016-10-05 (×2): qty 2

## 2016-10-05 MED ORDER — LORATADINE 10 MG PO TABS
10.0000 mg | ORAL_TABLET | Freq: Every day | ORAL | Status: DC
  Administered 2016-10-06 – 2016-10-07 (×2): 10 mg via ORAL
  Filled 2016-10-05 (×4): qty 1

## 2016-10-05 MED ORDER — SODIUM CHLORIDE 0.9% FLUSH: 3.0000 mL | INTRAVENOUS | Status: DC | PRN

## 2016-10-05 MED ORDER — ATORVASTATIN CALCIUM 20 MG PO TABS
40.0000 mg | ORAL_TABLET | Freq: Every day | ORAL | Status: DC
Start: 2016-10-06 — End: 2016-10-07
  Administered 2016-10-06: 40 mg via ORAL
  Filled 2016-10-05: qty 2

## 2016-10-05 MED ORDER — FERROUS SULFATE 325 (65 FE) MG PO TABS
325.0000 mg | ORAL_TABLET | Freq: Two times a day (BID) | ORAL | Status: DC
  Administered 2016-10-06 – 2016-10-07 (×3): 325 mg via ORAL
  Filled 2016-10-05 (×3): qty 1

## 2016-10-05 MED ORDER — PANTOPRAZOLE SODIUM 40 MG IV SOLR
40.0000 mg | Freq: Two times a day (BID) | INTRAVENOUS | Status: DC
  Administered 2016-10-05 – 2016-10-07 (×4): 40 mg via INTRAVENOUS
  Filled 2016-10-05 (×4): qty 40

## 2016-10-05 MED ORDER — ESCITALOPRAM OXALATE 10 MG PO TABS
10.0000 mg | ORAL_TABLET | Freq: Every day | ORAL | Status: DC
  Administered 2016-10-05 – 2016-10-06 (×2): 10 mg via ORAL
  Filled 2016-10-05 (×2): qty 1

## 2016-10-05 MED ORDER — INSULIN ASPART 100 UNIT/ML ~~LOC~~ SOLN
4.0000 [IU] | Freq: Three times a day (TID) | SUBCUTANEOUS | Status: DC
Start: 2016-10-06 — End: 2016-10-07
  Administered 2016-10-06 – 2016-10-07 (×5): 4 [IU] via SUBCUTANEOUS
  Filled 2016-10-05 (×5): qty 4

## 2016-10-05 MED ORDER — CARVEDILOL 6.25 MG PO TABS
6.2500 mg | ORAL_TABLET | Freq: Two times a day (BID) | ORAL | Status: DC
  Administered 2016-10-05 – 2016-10-07 (×4): 6.25 mg via ORAL
  Filled 2016-10-05 (×4): qty 1

## 2016-10-05 MED ORDER — INSULIN ASPART 100 UNIT/ML ~~LOC~~ SOLN
0.0000 [IU] | Freq: Every day | SUBCUTANEOUS | Status: DC
Start: 2016-10-05 — End: 2016-10-07
  Administered 2016-10-06: 2 [IU] via SUBCUTANEOUS
  Filled 2016-10-05: qty 2

## 2016-10-05 MED ORDER — SODIUM CHLORIDE 0.9 % IV SOLN
10.0000 mL/h | Freq: Once | INTRAVENOUS | Status: AC
  Administered 2016-10-05: 10 mL/h via INTRAVENOUS

## 2016-10-05 MED ORDER — FUROSEMIDE 10 MG/ML IJ SOLN
60.0000 mg | Freq: Two times a day (BID) | INTRAMUSCULAR | Status: DC
  Administered 2016-10-06 – 2016-10-07 (×3): 60 mg via INTRAVENOUS
  Filled 2016-10-05 (×2): qty 6
  Filled 2016-10-05: qty 8
  Filled 2016-10-05: qty 6

## 2016-10-05 MED ORDER — ONDANSETRON HCL 4 MG PO TABS: 4.0000 mg | ORAL_TABLET | Freq: Four times a day (QID) | ORAL | Status: DC | PRN

## 2016-10-05 MED ORDER — HYDROXYCHLOROQUINE SULFATE 200 MG PO TABS
200.0000 mg | ORAL_TABLET | Freq: Every day | ORAL | Status: DC
  Administered 2016-10-06 – 2016-10-07 (×2): 200 mg via ORAL
  Filled 2016-10-05 (×2): qty 1

## 2016-10-05 MED ORDER — ONDANSETRON HCL 4 MG/2ML IJ SOLN: 4.0000 mg | Freq: Four times a day (QID) | INTRAMUSCULAR | Status: DC | PRN

## 2016-10-05 MED ORDER — HYDROCODONE-ACETAMINOPHEN 5-325 MG PO TABS: 1.0000 | ORAL_TABLET | Freq: Every day | ORAL | Status: DC | PRN

## 2016-10-05 MED ORDER — INSULIN ASPART 100 UNIT/ML ~~LOC~~ SOLN
0.0000 [IU] | Freq: Three times a day (TID) | SUBCUTANEOUS | Status: DC
Start: 2016-10-06 — End: 2016-10-07
  Administered 2016-10-06: 2 [IU] via SUBCUTANEOUS
  Administered 2016-10-06: 7 [IU] via SUBCUTANEOUS
  Administered 2016-10-06: 3 [IU] via SUBCUTANEOUS
  Administered 2016-10-07: 2 [IU] via SUBCUTANEOUS
  Filled 2016-10-05: qty 3
  Filled 2016-10-05: qty 7
  Filled 2016-10-05 (×2): qty 2

## 2016-10-05 MED ORDER — SODIUM CHLORIDE 0.9 % IV SOLN: 250.0000 mL | INTRAVENOUS | Status: DC | PRN

## 2016-10-05 MED ORDER — ISOSORBIDE MONONITRATE ER 30 MG PO TB24
30.0000 mg | ORAL_TABLET | Freq: Every day | ORAL | Status: DC
  Administered 2016-10-06 – 2016-10-07 (×2): 30 mg via ORAL
  Filled 2016-10-05 (×2): qty 1

## 2016-10-05 NOTE — ED Triage Notes (Signed)
Pt presents to ED c/o shortness of breath for past few days. Hx CHF, bil leg swelling noted. Also hx anemia requiring transfusion, pt states he felt similarly the last time he needed blood.
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
## 2016-10-06 DIAGNOSIS — Z951 Presence of aortocoronary bypass graft: Secondary | ICD-10-CM

## 2016-10-06 DIAGNOSIS — D62 Acute posthemorrhagic anemia: Secondary | ICD-10-CM

## 2016-10-06 DIAGNOSIS — I110 Hypertensive heart disease with heart failure: Secondary | ICD-10-CM

## 2016-10-06 DIAGNOSIS — I5031 Acute diastolic (congestive) heart failure: Secondary | ICD-10-CM

## 2016-10-06 DIAGNOSIS — I5033 Acute on chronic diastolic (congestive) heart failure: Secondary | ICD-10-CM

## 2016-10-06 DIAGNOSIS — K921 Melena: Secondary | ICD-10-CM

## 2016-10-06 LAB — CBC
HCT: 28 % — ABNORMAL LOW (ref 40.0–52.0)
Hemoglobin: 9.3 g/dL — ABNORMAL LOW (ref 13.0–18.0)
MCH: 28.6 pg (ref 26.0–34.0)
MCHC: 33.3 g/dL (ref 32.0–36.0)
MCV: 85.8 fL (ref 80.0–100.0)
PLATELETS: 179 10*3/uL (ref 150–440)
RBC: 3.26 MIL/uL — ABNORMAL LOW (ref 4.40–5.90)
RDW: 17.5 % — AB (ref 11.5–14.5)
WBC: 5.1 10*3/uL (ref 3.8–10.6)

## 2016-10-06 LAB — HEMOGLOBIN: HEMOGLOBIN: 9.6 g/dL — AB (ref 13.0–18.0)

## 2016-10-06 LAB — HEMOGLOBIN AND HEMATOCRIT, BLOOD
HCT: 26.7 % — ABNORMAL LOW (ref 40.0–52.0)
Hemoglobin: 8.7 g/dL — ABNORMAL LOW (ref 13.0–18.0)

## 2016-10-06 LAB — TROPONIN I: Troponin I: <0.03 ng/mL (ref <0.03)

## 2016-10-06 LAB — GLUCOSE, CAPILLARY
GLUCOSE-CAPILLARY: 215 mg/dL — AB (ref 65–99)
Glucose-Capillary: 329 mg/dL — ABNORMAL HIGH (ref 65–99)
Glucose-Capillary: 229 mg/dL — ABNORMAL HIGH (ref 65–99)
Glucose-Capillary: 177 mg/dL — ABNORMAL HIGH (ref 65–99)

## 2016-10-06 LAB — TYPE AND SCREEN
ABO/RH(D): O POS
ANTIBODY SCREEN: NEGATIVE
Unit division: 0
Unit division: 0

## 2016-10-06 LAB — BASIC METABOLIC PANEL
Anion gap: 8 (ref 5–15)
BUN: 72 mg/dL — AB (ref 6–20)
CALCIUM: 8.8 mg/dL — AB (ref 8.9–10.3)
CHLORIDE: 98 mmol/L — AB (ref 101–111)
CO2: 25 mmol/L (ref 22–32)
CREATININE: 2.69 mg/dL — AB (ref 0.61–1.24)
GFR calc Af Amer: 23 mL/min — ABNORMAL LOW (ref >60)
GFR calc non Af Amer: 20 mL/min — ABNORMAL LOW (ref >60)
Glucose, Bld: 320 mg/dL — ABNORMAL HIGH (ref 65–99)
Potassium: 4.5 mmol/L (ref 3.5–5.1)
SODIUM: 131 mmol/L — AB (ref 135–145)

## 2016-10-06 LAB — BPAM RBC
BLOOD PRODUCT EXPIRATION DATE: 201805242359
Blood Product Expiration Date: 201805242359
ISSUE DATE / TIME: 201804281800
ISSUE DATE / TIME: 201804282124
Unit Type and Rh: 5100
Unit Type and Rh: 5100

## 2016-10-06 MED ORDER — ENSURE ENLIVE PO LIQD
237.0000 mL | ORAL | Status: DC
  Administered 2016-10-06: 237 mL via ORAL

## 2016-10-06 MED ORDER — FUROSEMIDE 10 MG/ML IJ SOLN
60.0000 mg | Freq: Once | INTRAMUSCULAR | Status: AC
  Administered 2016-10-06: 60 mg via INTRAVENOUS
  Filled 2016-10-06: qty 6

## 2016-10-06 NOTE — Progress Notes (Signed)
Pt. Sitting up on side of bed eating ice cream and drinking milk. He continued to run afib throughout the night. HR ran between high 50l60 2 - 3+ pitting BLE.

## 2016-10-06 NOTE — Progress Notes (Signed)
Initial Nutrition Assessment  DOCUMENTATION CODES:   Obesity unspecified  INTERVENTION:   Ensure Enlive po daily, each supplement provides 350 kcal and 20 grams of protein  MVI  NUTRITION DIAGNOSIS:   Increased nutrient needs related to wound healing as evidenced by increased estimated needs from protein.  GOAL:   Patient will meet greater than or equal to 90% of their needs  MONITOR:   PO intake, Supplement acceptance, Labs, Weight trends, Skin  REASON FOR ASSESSMENT:   Malnutrition Screening Tool    ASSESSMENT:   83 y.o. male with a known history of pulmonary hypertension, chronic diastolic CHF, CAD, stage IV, hyponatremia, chronic gastrointestinal blood loss, iron deficiency anemia, anemia of chronic disease due to CK D, who presents to the hospital with complaints of weakness, increasing shortness of breath, inability to get out from the bed due to weakness. Patient's caregiver is also reporting black looking stool for the past one week, patient requires higher elevation of the head over the past one week as well. Patient found to have CHF   Met with pt in room today. Pt reports poor appetite and oral intake for 2 days pta but reports good appetite today. Pt currently eating 75% if meals. Pt reports that he drinks Ensure at home. Per chart, pt with weight gain likely r/t fluid changes as pt with CHF and edema. Pt with Stage II ulcer on buttocks; RD discussed with pt adequate protein intake for wound healing.    Medications reviewed and include: ferrous sulfate, lasix, insulin, MVI, protonix  Labs reviewed: Na 131(L), Cl 98(L), BUN 72(H), creat 2.69(H), Ca 8.8(L) Hgb 9.3(L), Hct 28(L) cbgs- 140, 320 x 24 hrs AIC 6.6(H) 3/18  Nutrition-Focused physical exam completed. Findings are no fat depletion, moderate muscle depletion in temporal region, and moderate edema.   Diet Order:  Diet heart healthy/carb modified Room service appropriate? Yes; Fluid consistency:  Thin  Skin:  Wound (see comment) (Stage II buttocks)  Last BM:  4/28  Height:   Ht Readings from Last 1 Encounters:  10/05/16 _0  (1.778 m)    Weight:   Wt Readings from Last 1 Encounters:  10/06/16 245 lb (111.1 kg)    Ideal Body Weight:  75.4 kg  BMI:  Body mass index is 35.15 kg/m.  Estimated Nutritional Needs:   Kcal:  2000-2300kcal/day   Protein:  111-123g/day   Fluid:  >2L/day   EDUCATION NEEDS:   No education needs identified at this time  Koleen Distance, RD, LDN Pager #[phone]

## 2016-10-06 NOTE — Progress Notes (Signed)
Pt. Refuses to use bed alarm, educated.

## 2016-10-06 NOTE — Consult Note (Signed)
Patient ID: [name] MRN: [id number] DOB/AGE: August 20, 1927 83 y.o.  Admit date: 10/05/2016 Primary Physician [name], MD  Primary Cardiologist [name]   Chief Complaint  weakness, shortness of breath  HPI: [name] is an 73M with chronic diastolic heart failure, aortic stenosis s/p bioprosthetic AVR in 2009, CAD s/p 1v CABG in 2009, longstanding persistent atrial fibrillation s/p Watchman, prior stroke, CKD IV, chronic GI bleed 2/2 AVMs, iron deficiency anemia, and pulmonary hypertension here with acute on chronic diastolic heart failure and melena.  For the last week [name] reported increased weakness. His caregiver also reported melena. Upon arrival to the hospital his hemoglobin was noted to be 7.7  down from 8.2 two weeks ago. He was started on IV pantoprazole and received 2 units of packed red blood cells. He was also started on IV Lasix for diuresis.  He reports significant improvement in his breathing and energy levels.  He denies chest pain or pressure, lightheadedness, dizziness, abdominal pain, nausea or vomiting.    [name] last had a capsule endoscopy 07/2015 that did not reveal any active bleeding or AVMs.  Prior to that he had ablation of AVMs in the stomach and duodenum on 04/14/15.  He has been admitted multiple times with acute on chronic diastolic heart failure.  He was admitted three times in the last month requiring diuresis.  His last echo 08/26/16 revealed LVEF 55-60% with a normally functioning bioprosthetic aortic valve (mean gradient 9 mmHg), and severely elevated pulmonary pressures (PASP 70 mmHg).   Review of Systems:   A 12 point review of systems was obtained and was negative with pertinent positives noted in the HPI.    Past Medical History:  Diagnosis Date  . Anemia   . Aortic stenosis, severe    a. s/p bioprosthetic aortic valve replacement in 2009  . BPH (benign prostatic hypertrophy)   . CAD (coronary artery disease)    a. s/p CABG x 1 in 2009  . Chronic diastolic (congestive) heart failure ([place])   . CVA (cerebral infarction) 06/2014   Right MCA infarct  . Diabetes mellitus   . Gastrointestinal bleed    a. reccurent GIB  . History of bladder cancer   . Hyperlipidemia   . Hypertension   . Junctional bradycardia   . Macular degeneration   . Mobitz type 1 second degree atrioventricular block 10/01/2012  . OA (osteoarthritis)   . Permanent atrial fibrillation ([place])    a. s/p Watchman device 08/10/2015; b. on Eliquis    Medications Prior to Admission  Medication Sig Dispense Refill  . acetaminophen (TYLENOL) 325 MG tablet Take 2 tablets (650 mg total) by mouth every 6 (six) hours as needed for mild pain (or Fever >/= 101).    [name] aspirin EC 81 MG tablet Take 1 tablet (81 mg total) by mouth daily. 90 tablet 3  . azelastine (OPTIVAR) 0.05 % ophthalmic solution Place 1 drop into the right eye 2 (two) times daily.    . calcitRIOL (ROCALTROL) 0.25 MCG capsule TAKE ONE CAPSULE THREE TIMES A WEEK (MONDAY, WEDNESDAY, AND FRIDAY) 15 capsule 5  . carvedilol (COREG) 6.25 MG tablet Take 1 tablet (6.25 mg total) by mouth 2 (two) times daily. 180 tablet 0  . cholecalciferol (VITAMIN D) 1000 units tablet Take 1,000 Units by mouth daily.    [name] escitalopram (LEXAPRO) 10 MG tablet TAKE 1 TABLET EVERY DAY WITH DINNER 30 tablet 3  . ferrous sulfate 325 (65 FE)  MG tablet Take 1 tablet (325 mg total) by mouth 2 (two) times daily with a meal. 60 tablet 3  . HYDROcodone-acetaminophen (NORCO/VICODIN) 5-325 MG tablet Take 1 tablet by mouth daily as needed for moderate pain. DO NOT EXCEED 4GM OF APAP IN 24 HOURS FROM ALL SOURCES 15 tablet 0  . hydroxychloroquine (PLAQUENIL) 200 MG tablet Take 200 mg by mouth daily.    . insulin aspart (NOVOLOG) 100 UNIT/ML injection Inject 4 Units into the skin 3 (three) times daily before meals. 10 mL 11  . insulin glargine (LANTUS) 100 UNIT/ML injection Inject 0.3 mLs (30 Units total) into the skin  daily. (Patient taking differently: Inject 35 Units into the skin daily. ) 10 mL 11  . isosorbide mononitrate (IMDUR) 30 MG 24 hr tablet Take 30 mg by mouth daily.    [name] loratadine (CLARITIN) 10 MG tablet Take 10 mg by mouth daily.    . Multiple Vitamin (MULTIVITAMIN WITH MINERALS) TABS tablet Take 1 tablet by mouth daily.    . Multiple Vitamins-Minerals (PRESERVISION AREDS 2) CAPS Take 1 capsule by mouth 2 (two) times daily.    . pantoprazole (PROTONIX) 40 MG tablet Take 1 tablet (40 mg total) by mouth daily. 90 tablet 3  . tamsulosin (FLOMAX) 0.4 MG CAPS capsule TAKE 1 CAPSULE BY MOUTH DAILY AFTER SUPPER 30 capsule 5  . torsemide (DEMADEX) 20 MG tablet Take 2 tablets (40 mg total) by mouth 2 (two) times daily. 60 tablet 2  . vitamin C (ASCORBIC ACID) 500 MG tablet Take 500 mg by mouth daily.    [name] zolpidem (AMBIEN) 5 MG tablet Take 1 tablet (5 mg total) by mouth at bedtime as needed for sleep. 30 tablet 5  . atorvastatin (LIPITOR) 40 MG tablet Take 1 tablet (40 mg total) by mouth daily at 6 PM. (Patient not taking: Reported on 10/05/2016) 30 tablet 2     . atorvastatin  40 mg Oral q1800  . carvedilol  6.25 mg Oral BID  . escitalopram  10 mg Oral QHS  . ferrous sulfate  325 mg Oral BID WC  . furosemide  60 mg Intravenous BID  . hydroxychloroquine  200 mg Oral Daily  . insulin aspart  0-5 Units Subcutaneous QHS  . insulin aspart  0-9 Units Subcutaneous TID WC  . insulin aspart  4 Units Subcutaneous TID AC  . insulin glargine  30 Units Subcutaneous Daily  . isosorbide mononitrate  30 mg Oral Daily  . ketotifen  1 drop Right Eye BID  . loratadine  10 mg Oral Daily  . multivitamin with minerals  1 tablet Oral Daily  . pantoprazole (PROTONIX) IV  40 mg Intravenous Q12H  . sodium chloride flush  3 mL Intravenous Q12H  . tamsulosin  0.4 mg Oral Daily    Infusions: . sodium chloride      Allergies  Allergen Reactions  . Asa [Aspirin] Other (See Comments)    Reaction:  Caused stomach  ulcer patient can take 81 mg.  . Losartan Other (See Comments)    Decreased pulse rate  . Metoprolol Other (See Comments)    Reaction:  Bradycardia   . Penicillins Swelling and Other (See Comments)    Has patient had a PCN reaction causing immediate rash, facial/tongue/throat swelling, SOB or lightheadedness with hypotension: No Has patient had a PCN reaction causing severe rash involving mucus membranes or skin necrosis: No Has patient had a PCN reaction that required hospitalization No Has patient had a PCN reaction occurring  within the last 10 years: No If all of the above answers are "NO", then may proceed with Cephalosporin use.    Social History   Social History  . Marital status: Widowed    Spouse name: N/A  . Number of children: N/A  . Years of education: N/A   Occupational History  . Not on file.   Social History Main Topics  . Smoking status: Former Smoker    Quit date: 06/10/1994  . Smokeless tobacco: Never Used  . Alcohol use Yes     Comment: 1-2 drinks daily  . Drug use: No  . Sexual activity: Not Currently   Other Topics Concern  . Not on file   Social History Narrative   Retired Marketing executive   Son is cardiologist   Lives in [place]    Family History  Problem Relation Age of Onset  . Stroke Father   . Diabetes Brother   . Prostate cancer Brother     PHYSICAL EXAM: Vitals:   10/06/16 0651 10/06/16 0756  BP: (!) 148/71 (!) 150/49  Pulse: (!) 55 (!) 58  Resp: 19 18  Temp: 97.6 F (36.4 C) 97.9 F (36.6 C)     Intake/Output Summary (Last 24 hours) at 10/06/16 1007 Last data filed at 10/06/16 1003  Gross per 24 hour  Intake          1314.63 ml  Output             2100 ml  Net          -785.37 ml    General:  Well appearing. No respiratory difficulty.  Sitting up on bed eating lunch comfortably.   HEENT: normal Neck: supple. JVP difficult to assess.  Carotids 2+ bilat; no bruits. No lymphadenopathy or thryomegaly appreciated. Cor:  Irregularly irregular.  PMI nondisplaced. Regular rate & rhythm. No rubs, gallops or murmurs. Lungs: Diminished at bilateral bases.   Abdomen: soft, nontender, nondistended. No hepatosplenomegaly. No bruits or masses. Good bowel sounds. Extremities: no cyanosis, clubbing, rash, 2+, tense pitting edema to knee bilaterally Neuro: alert & oriented x 3, cranial nerves grossly intact. moves all 4 extremities w/o difficulty. Affect pleasant.  Results for orders placed or performed during the hospital encounter of 10/05/16 (from the past 24 hour(s))  Basic metabolic panel     Status: Abnormal   Collection Time: 10/05/16  3:46 PM  Result Value Ref Range   Sodium 134 (L) 135 - 145 mmol/L   Potassium 4.3 3.5 - 5.1 mmol/L   Chloride 99 (L) 101 - 111 mmol/L   CO2 24 22 - 32 mmol/L   Glucose, Bld 140 (H) 65 - 99 mg/dL   BUN 77 (H) 6 - 20 mg/dL   Creatinine, Ser 3.10 (H) 0.61 - 1.24 mg/dL   Calcium 8.9 8.9 - 10.3 mg/dL   GFR calc non Af Amer 17 (L) >60 mL/min   GFR calc Af Amer 19 (L) >60 mL/min   Anion gap 11 5 - 15  CBC     Status: Abnormal   Collection Time: 10/05/16  3:46 PM  Result Value Ref Range   WBC 4.8 3.8 - 10.6 K/uL   RBC 2.72 (L) 4.40 - 5.90 MIL/uL   Hemoglobin 7.7 (L) 13.0 - 18.0 g/dL   HCT 23.3 (L) 40.0 - 52.0 %   MCV 85.7 80.0 - 100.0 fL   MCH 28.3 26.0 - 34.0 pg   MCHC 33.0 32.0 - 36.0 g/dL   RDW 17.7 (H) 11.5 -  14.5 %   Platelets 193 150 - 440 K/uL  Troponin I     Status: None   Collection Time: 10/05/16  3:46 PM  Result Value Ref Range   Troponin I <0.03 <0.03 ng/mL  Type and screen [place]     Status: None   Collection Time: 10/05/16  3:46 PM  Result Value Ref Range   ABO/RH(D) O POS    Antibody Screen NEG    Sample Expiration 10/08/2016    Unit Number [id number]    Blood Component Type RED CELLS,LR    Unit division 00    Status of Unit ISSUED,FINAL    Transfusion Status OK TO TRANSFUSE    Crossmatch Result Compatible    Unit Number  [id number]    Blood Component Type RBC LR PHER1    Unit division 00    Status of Unit ISSUED,FINAL    Transfusion Status OK TO TRANSFUSE    Crossmatch Result Compatible   Prepare RBC     Status: None   Collection Time: 10/05/16  5:00 PM  Result Value Ref Range   Order Confirmation ORDER PROCESSED BY BLOOD BANK   Glucose, capillary     Status: Abnormal   Collection Time: 10/05/16  9:40 PM  Result Value Ref Range   Glucose-Capillary 166 (H) 65 - 99 mg/dL  Hemoglobin     Status: Abnormal   Collection Time: 10/06/16  1:07 AM  Result Value Ref Range   Hemoglobin 9.6 (L) 13.0 - 18.0 g/dL  Troponin I     Status: None   Collection Time: 10/06/16  1:07 AM  Result Value Ref Range   Troponin I <0.03 <0.03 ng/mL  Troponin I     Status: None   Collection Time: 10/06/16  6:32 AM  Result Value Ref Range   Troponin I <0.03 <0.03 ng/mL  Basic metabolic panel     Status: Abnormal   Collection Time: 10/06/16  6:32 AM  Result Value Ref Range   Sodium 131 (L) 135 - 145 mmol/L   Potassium 4.5 3.5 - 5.1 mmol/L   Chloride 98 (L) 101 - 111 mmol/L   CO2 25 22 - 32 mmol/L   Glucose, Bld 320 (H) 65 - 99 mg/dL   BUN 72 (H) 6 - 20 mg/dL   Creatinine, Ser 2.69 (H) 0.61 - 1.24 mg/dL   Calcium 8.8 (L) 8.9 - 10.3 mg/dL   GFR calc non Af Amer 20 (L) >60 mL/min   GFR calc Af Amer 23 (L) >60 mL/min   Anion gap 8 5 - 15  CBC     Status: Abnormal   Collection Time: 10/06/16  6:32 AM  Result Value Ref Range   WBC 5.1 3.8 - 10.6 K/uL   RBC 3.26 (L) 4.40 - 5.90 MIL/uL   Hemoglobin 9.3 (L) 13.0 - 18.0 g/dL   HCT 28.0 (L) 40.0 - 52.0 %   MCV 85.8 80.0 - 100.0 fL   MCH 28.6 26.0 - 34.0 pg   MCHC 33.3 32.0 - 36.0 g/dL   RDW 17.5 (H) 11.5 - 14.5 %   Platelets 179 150 - 440 K/uL  Glucose, capillary     Status: Abnormal   Collection Time: 10/06/16  7:32 AM  Result Value Ref Range   Glucose-Capillary 329 (H) 65 - 99 mg/dL   Dg Chest 2 View  Result Date: 10/05/2016 CLINICAL DATA:  Shortness of breath.   Lower extremity edema. EXAM: CHEST  2 VIEW COMPARISON:  08/24/2016  FINDINGS: Stable postsurgical changes from CABG. Cardiomediastinal silhouette is normal. Mediastinal contours appear intact. Calcific atherosclerotic disease and tortuosity of the aorta. There is no evidence of focal airspace consolidation, or pneumothorax. Small nodular density overlies the right lower thorax. Interstitial pulmonary edema. There is a small right and moderate left pleural effusion. Osseous structures are without acute abnormality. Soft tissues are grossly normal. IMPRESSION: Interstitial pulmonary edema with small right and moderate left pleural effusions. Subcentimeter nodular density overlies the right lower thorax. This may represent overlapping shadows, however pulmonary nodule cannot be excluded. Enlarged heart. Calcific atherosclerotic disease and tortuosity of the aorta. Electronically Signed   By: Fidela Salisbury M.D.   On: 10/05/2016 16:24     ECG:  10/05/16: Atrial fibrillation rate 65 bpm. PVCs. LBBB  Echo 08/26/16: Study Conclusions  - Left ventricle: The cavity size was normal. There was moderate   concentric hypertrophy. Systolic function was normal. The   estimated ejection fraction was in the range of 55% to 60%.   Images were inadequate for LV wall motion assessment. The study   was not technically sufficient to allow evaluation of LV   diastolic dysfunction due to atrial fibrillation. - Aortic valve: A bioprosthesis was present and functioning   normally. Mean gradient (S): 9 mm Hg. Peak gradient (S): 17 mm   Hg. Valve area (VTI): 1.38 cm^2. - Mitral valve: Calcified annulus. - Left atrium: The atrium was moderately dilated. - Right atrium: The atrium was mildly dilated. - Pulmonary arteries: Systolic pressure was severely increased. PA   peak pressure: 70 mm Hg (S). - Inferior vena cava: Not visualized.  ASSESSMENT/PLAN:  # Acute on chronic diastolic heart failure: [name] is  quite volume overloaded. He is feeling much better with IV lasix. So far he is negative 1.5L.  Continue lasix 60 mg IV q12h.  Continue carvedilol and Imdur.  # Acute on chronic renal failure: Kidney function improving with diuresis.  # Hypertensive heart disease:  BP above goal but was low last night.  Will continue to monitor and adjust regimen tomorrow if BP remains >130/90.  Permissive hypertension in setting of anemia until we know that is stable.   # Upper GI bleed: Known AVMs.  Agree with transfusion and PPI.  Will check a repeat H/H to make sure it is stable.   # Longstanding persistent atrial fibrillation: Watchman device in place.  No anticoagulation.  Continue carvedilol.  # CAD: # Hyperlipidemia: Not an active issue.  Continue carvedilol and atorvastatin. Home aspirin currently on hold.    Signed: Deatrice Spanbauer C. Oval Linsey, MD, [facility]  10/06/2016, 10:07 AM

## 2016-10-06 NOTE — Progress Notes (Signed)
[facility] [place] [name]     MR#:  [id number]  DATE OF BIRTH:  04-17-1928  SUBJECTIVE:  Patient here with SOB, increasing LEE  Patient feels better this am  REVIEW OF SYSTEMS:    Review of Systems  Constitutional: Negative for fever, chills weight loss HENT: Negative for ear pain, nosebleeds, congestion, facial swelling, rhinorrhea, neck pain, neck stiffness and ear discharge.   Respiratory: Negative for cough, improving shortness of breath, no wheezing  Cardiovascular: Negative for chest pain, palpitations and ++ leg swelling.  Gastrointestinal: Negative for heartburn, abdominal pain, vomiting, diarrhea or consitpation Genitourinary: Negative for dysuria, urgency, frequency, hematuria Musculoskeletal: Negative for back pain or joint pain Neurological: Negative for dizziness, seizures, syncope, focal weakness,  numbness and headaches.  Hematological: Does not bruise/bleed easily.  Psychiatric/Behavioral: Negative for hallucinations, confusion, dysphoric mood    Tolerating Diet:yes      DRUG ALLERGIES:   Allergies  Allergen Reactions  . Asa [Aspirin] Other (See Comments)    Reaction:  Caused stomach ulcer patient can take 81 mg.  . Losartan Other (See Comments)    Decreased pulse rate  . Metoprolol Other (See Comments)    Reaction:  Bradycardia   . Penicillins Swelling and Other (See Comments)    Has patient had a PCN reaction causing immediate rash, facial/tongue/throat swelling, SOB or lightheadedness with hypotension: No Has patient had a PCN reaction causing severe rash involving mucus membranes or skin necrosis: No Has patient had a PCN reaction that required hospitalization No Has patient had a PCN reaction occurring within the last 10 years: No If all of the above answers are "NO", then may proceed with Cephalosporin use.    VITALS:  Blood pressure (!) 150/49, pulse (!) 58, temperature 97.9 F (36.6  C), temperature source Oral, resp. rate 18, height 5\' 10"  (1.778 m), weight 111.1 kg (245 lb), SpO2 97 %.  PHYSICAL EXAMINATION:  Constitutional: Appears well-developed and well-nourished. No distress. HENT: Normocephalic. [name] Oropharynx is clear and moist.  Eyes: Conjunctivae and EOM are normal. PERRLA, no scleral icterus.  Neck: Normal ROM. Neck supple. No JVD. No tracheal deviation. CVS: irr, irr, S1/S2 +, 2/6 SEM no gallops, no carotid bruit.  Pulmonary: Effort nml, crackles at bases, no stridor, rhonchi, wheezes, rales.  Abdominal: Soft. BS +,  no distension, tenderness, rebound or guarding.  Musculoskeletal: Normal range of motion. 4+ edema and no tenderness.  Neuro: Alert. CN 2-12 grossly intact. No focal deficits. Skin: Skin is warm and dry. No rash noted. Psychiatric: Normal mood and affect.      LABORATORY PANEL:   CBC  Recent Labs Lab 10/06/16 0632  WBC 5.1  HGB 9.3*  HCT 28.0*  PLT 179   ------------------------------------------------------------------------------------------------------------------  Chemistries   Recent Labs Lab 10/06/16 0632  NA 131*  K 4.5  CL 98*  CO2 25  GLUCOSE 320*  BUN 72*  CREATININE 2.69*  CALCIUM 8.8*   ------------------------------------------------------------------------------------------------------------------  Cardiac Enzymes  Recent Labs Lab 10/05/16 1546 10/06/16 0107 10/06/16 0632  TROPONINI <0.03 <0.03 <0.03   ------------------------------------------------------------------------------------------------------------------  RADIOLOGY:  Dg Chest 2 View  Result Date: 10/05/2016 CLINICAL DATA:  Shortness of breath.  Lower extremity edema. EXAM: CHEST  2 VIEW COMPARISON:  08/24/2016 FINDINGS: Stable postsurgical changes from CABG. Cardiomediastinal silhouette is normal. Mediastinal contours appear intact. Calcific atherosclerotic disease and tortuosity of the aorta. There is no evidence of focal airspace  consolidation, or pneumothorax. Small nodular density overlies the  right lower thorax. Interstitial pulmonary edema. There is a small right and moderate left pleural effusion. Osseous structures are without acute abnormality. Soft tissues are grossly normal. IMPRESSION: Interstitial pulmonary edema with small right and moderate left pleural effusions. Subcentimeter nodular density overlies the right lower thorax. This may represent overlapping shadows, however pulmonary nodule cannot be excluded. Enlarged heart. Calcific atherosclerotic disease and tortuosity of the aorta. Electronically Signed   By: Fidela Salisbury M.D.   On: 10/05/2016 16:24     ASSESSMENT AND PLAN:   83 year old male with chronic diastolic heart failure, chronic kidney disease stage IV and iron deficiency anemia who presented with shortness of breath.   1. Acute on chronic diastolic heart failure: Continue Lasix Monitor intake and output with daily weight Monitor BMP for a.m. Await cardiology consult for further recommendations.  2. Acute on chronic iron deficiency anemia: Status post 1 unit blood transfusion Repeat CBC in a.m.  3. Diabetes: Continue sliding scale insulin, ADA diet and maintenance  4. Essential hypertension: Continue Imdur, Coreg  5. Chronic kidney disease stage IV: Creatinine from baseline  6. Hyperlipidemia: Continue statin  7. Hyponatremia due to CHF exacerbation which is improved with treatment for CHF. 8. Rheumatoid arthritis: Continue Plaquenil  9. Chronic atrial fibrillation: Continue Coreg for heart rate control Management plans discussed with the patient and he is in agreement.  CODE STATUS: full  TOTAL TIME TAKING CARE OF THIS PATIENT: 30 minutes.     POSSIBLE D/C 2-4 days, DEPENDING ON CLINICAL CONDITION.   Jelisa  M.D on 10/06/2016 at 10:34 AM  Between 7am to 6pm - Pager - [phone] After 6pm go to [email or web address] - password [email or web address] [facility]   Office  [phone]  CC: Primary care physician; [name], MD  Note: This dictation was prepared with Dragon dictation along with smaller phrase technology. Any transcriptional errors that result from this process are unintentional.

## 2016-10-06 NOTE — Progress Notes (Signed)
Patient admitted to room 232 from the ER. A&o x4, VSS. PRBC infusing at 93 mL/hr. Heart monitor applied to patient, verified with CCMD. No complaints at this time.

## 2016-10-06 NOTE — Progress Notes (Addendum)
New order for lasix per Dr. Estanislado Pandy, Pt. Received 2 units PRBC, has 2-3+ edema BLE and SOB, saturations are in high 90's.

## 2016-10-07 ENCOUNTER — Telehealth: Payer: Self-pay

## 2016-10-07 ENCOUNTER — Telehealth: Payer: Self-pay | Admitting: Internal Medicine

## 2016-10-07 ENCOUNTER — Inpatient Hospital Stay

## 2016-10-07 LAB — CBC
HCT: 28.7 % — ABNORMAL LOW (ref 40.0–52.0)
Hemoglobin: 9.3 g/dL — ABNORMAL LOW (ref 13.0–18.0)
MCH: 27.6 pg (ref 26.0–34.0)
MCHC: 32.4 g/dL (ref 32.0–36.0)
MCV: 85.1 fL (ref 80.0–100.0)
PLATELETS: 189 10*3/uL (ref 150–440)
RBC: 3.38 MIL/uL — AB (ref 4.40–5.90)
RDW: 17.7 % — AB (ref 11.5–14.5)
WBC: 6.2 10*3/uL (ref 3.8–10.6)

## 2016-10-07 LAB — BASIC METABOLIC PANEL
Anion gap: 9 (ref 5–15)
BUN: 81 mg/dL — ABNORMAL HIGH (ref 6–20)
CALCIUM: 8.7 mg/dL — AB (ref 8.9–10.3)
CO2: 26 mmol/L (ref 22–32)
CREATININE: 2.81 mg/dL — AB (ref 0.61–1.24)
Chloride: 99 mmol/L — ABNORMAL LOW (ref 101–111)
GFR calc non Af Amer: 19 mL/min — ABNORMAL LOW (ref >60)
GFR, EST AFRICAN AMERICAN: 22 mL/min — AB (ref >60)
Glucose, Bld: 94 mg/dL (ref 65–99)
Potassium: 4.3 mmol/L (ref 3.5–5.1)
SODIUM: 134 mmol/L — AB (ref 135–145)

## 2016-10-07 LAB — GLUCOSE, CAPILLARY
Glucose-Capillary: 89 mg/dL (ref 65–99)
Glucose-Capillary: 161 mg/dL — ABNORMAL HIGH (ref 65–99)

## 2016-10-07 MED ORDER — INSULIN GLARGINE 100 UNIT/ML ~~LOC~~ SOLN: 35.0000 [IU] | Freq: Every day | SUBCUTANEOUS | Status: DC

## 2016-10-07 NOTE — Care Management Important Message (Signed)
Important Message  Patient Details  [name] MRN: [id number] Date of Birth: [birth year]   Medicare Important Message Given:  Yes    Katrina Stack, RN 10/07/2016, 11:12 AM

## 2016-10-07 NOTE — Care Management (Signed)
Patient to discharge home with resumption of home health through [facility]. Notified agency.

## 2016-10-07 NOTE — Discharge Summary (Signed)
[facility] [name]    MR#:  [id number]  DATE OF BIRTH:  1927-12-21  DATE OF ADMISSION:  10/05/2016 ADMITTING PHYSICIAN: Theodoro Grist, MD  DATE OF DISCHARGE: 10/07/2016  PRIMARY CARE PHYSICIAN: [name], MD    ADMISSION DIAGNOSIS:  Acute on chronic diastolic heart failure (HCC) [I50.33] Upper GI bleed [K92.2] CKD (chronic kidney disease), stage IV (HCC) [N18.4] Symptomatic anemia [D64.9]  DISCHARGE DIAGNOSIS:  Active Problems:   Acute on chronic diastolic CHF (congestive heart failure) (HCC)   Acute posthemorrhagic anemia   SECONDARY DIAGNOSIS:   Past Medical History:  Diagnosis Date  . Anemia   . Aortic stenosis, severe    a. s/p bioprosthetic aortic valve replacement in 2009  . BPH (benign prostatic hypertrophy)   . CAD (coronary artery disease)    a. s/p CABG x 1 in 2009  . Chronic diastolic (congestive) heart failure ([place])   . CVA (cerebral infarction) 06/2014   Right MCA infarct  . Diabetes mellitus   . Gastrointestinal bleed    a. reccurent GIB  . History of bladder cancer   . Hyperlipidemia   . Hypertension   . Junctional bradycardia   . Macular degeneration   . Mobitz type 1 second degree atrioventricular block 10/01/2012  . OA (osteoarthritis)   . Permanent atrial fibrillation ([place])    a. s/p Watchman device 08/10/2015; b. on Eliquis    HOSPITAL COURSE:   83 year old male with chronic diastolic heart failure, chronic kidney disease stage IV and iron deficiency anemia who presented with shortness of breath.   1. Acute on chronic diastolic heart failure: Has diuresed well. Patient will continue torsemide 40 Miller's by mouth twice a day. Patient will follow-up with his cardiologist outpatient in one week. Patient was referred for CHF clinic.   2. Acute on chronic iron deficiency anemia: Patient is status post 1 unit PRBCs and shortness of breath has resolved.  3. Diabetes: Continue  outpatient regimen with ADA diet 4. Essential hypertension: Continue Imdur, Coreg  5. Chronic kidney disease stage IV: Creatinine is at baseline  6. Hyperlipidemia: Continue statin  7. Hyponatremia due to CHF exacerbation which has improved with treatment for CHF. 8. Rheumatoid arthritis: Continue Plaquenil  9. Chronic atrial fibrillation: Continue Coreg for heart rate control   DISCHARGE CONDITIONS AND DIET:   Patient stable for discharge on heart healthy/diabetic diet  CONSULTS OBTAINED:  Treatment Team:  Minna Merritts, MD  DRUG ALLERGIES:   Allergies  Allergen Reactions  . Asa [Aspirin] Other (See Comments)    Reaction:  Caused stomach ulcer patient can take 81 mg.  . Losartan Other (See Comments)    Decreased pulse rate  . Metoprolol Other (See Comments)    Reaction:  Bradycardia   . Penicillins Swelling and Other (See Comments)    Has patient had a PCN reaction causing immediate rash, facial/tongue/throat swelling, SOB or lightheadedness with hypotension: No Has patient had a PCN reaction causing severe rash involving mucus membranes or skin necrosis: No Has patient had a PCN reaction that required hospitalization No Has patient had a PCN reaction occurring within the last 10 years: No If all of the above answers are "NO", then may proceed with Cephalosporin use.    DISCHARGE MEDICATIONS:   Current Discharge Medication List    CONTINUE these medications which have CHANGED   Details  insulin glargine (LANTUS) 100 UNIT/ML injection Inject 0.35 mLs (35 Units total) into the  skin daily.      CONTINUE these medications which have NOT CHANGED   Details  acetaminophen (TYLENOL) 325 MG tablet Take 2 tablets (650 mg total) by mouth every 6 (six) hours as needed for mild pain (or Fever >/= 101).    aspirin EC 81 MG tablet Take 1 tablet (81 mg total) by mouth daily. Qty: 90 tablet, Refills: 3    azelastine (OPTIVAR) 0.05 % ophthalmic solution Place 1 drop into  the right eye 2 (two) times daily.    calcitRIOL (ROCALTROL) 0.25 MCG capsule TAKE ONE CAPSULE THREE TIMES A WEEK (MONDAY, WEDNESDAY, AND FRIDAY) Qty: 15 capsule, Refills: 5    carvedilol (COREG) 6.25 MG tablet Take 1 tablet (6.25 mg total) by mouth 2 (two) times daily. Qty: 180 tablet, Refills: 0    cholecalciferol (VITAMIN D) 1000 units tablet Take 1,000 Units by mouth daily.    escitalopram (LEXAPRO) 10 MG tablet TAKE 1 TABLET EVERY DAY WITH DINNER Qty: 30 tablet, Refills: 3    ferrous sulfate 325 (65 FE) MG tablet Take 1 tablet (325 mg total) by mouth 2 (two) times daily with a meal. Qty: 60 tablet, Refills: 3    HYDROcodone-acetaminophen (NORCO/VICODIN) 5-325 MG tablet Take 1 tablet by mouth daily as needed for moderate pain. DO NOT EXCEED 4GM OF APAP IN 24 HOURS FROM ALL SOURCES Qty: 15 tablet, Refills: 0   Associated Diagnoses: Arthritis    hydroxychloroquine (PLAQUENIL) 200 MG tablet Take 200 mg by mouth daily.    insulin aspart (NOVOLOG) 100 UNIT/ML injection Inject 4 Units into the skin 3 (three) times daily before meals. Qty: 10 mL, Refills: 11    isosorbide mononitrate (IMDUR) 30 MG 24 hr tablet Take 30 mg by mouth daily.    loratadine (CLARITIN) 10 MG tablet Take 10 mg by mouth daily.    Multiple Vitamin (MULTIVITAMIN WITH MINERALS) TABS tablet Take 1 tablet by mouth daily.    Multiple Vitamins-Minerals (PRESERVISION AREDS 2) CAPS Take 1 capsule by mouth 2 (two) times daily.    pantoprazole (PROTONIX) 40 MG tablet Take 1 tablet (40 mg total) by mouth daily. Qty: 90 tablet, Refills: 3    tamsulosin (FLOMAX) 0.4 MG CAPS capsule TAKE 1 CAPSULE BY MOUTH DAILY AFTER SUPPER Qty: 30 capsule, Refills: 5    torsemide (DEMADEX) 20 MG tablet Take 2 tablets (40 mg total) by mouth 2 (two) times daily. Qty: 60 tablet, Refills: 2    vitamin C (ASCORBIC ACID) 500 MG tablet Take 500 mg by mouth daily.    zolpidem (AMBIEN) 5 MG tablet Take 1 tablet (5 mg total) by mouth at  bedtime as needed for sleep. Qty: 30 tablet, Refills: 5    atorvastatin (LIPITOR) 40 MG tablet Take 1 tablet (40 mg total) by mouth daily at 6 PM. Qty: 30 tablet, Refills: 2          Today   CHIEF COMPLAINT:   Patient is ready for discharge. Shortness of breath is improved. Patient denies PND or orthopnea.   VITAL SIGNS:  Blood pressure (!) 151/52, pulse 60, temperature 97.7 F (36.5 C), temperature source Oral, resp. rate 18, height 5\' 10"  (1.778 m), weight 112.3 kg (247 lb 9.2 oz), SpO2 96 %.   REVIEW OF SYSTEMS:  Review of Systems  Constitutional: Negative.  Negative for chills, fever and malaise/fatigue.  HENT: Negative.  Negative for ear discharge, ear pain, hearing loss, nosebleeds and sore throat.   Eyes: Negative.  Negative for blurred vision and pain.  Respiratory: Negative.  Negative for cough, hemoptysis, shortness of breath and wheezing.   Cardiovascular: Positive for leg swelling. Negative for chest pain and palpitations.  Gastrointestinal: Negative.  Negative for abdominal pain, blood in stool, diarrhea, nausea and vomiting.  Genitourinary: Negative.  Negative for dysuria.  Musculoskeletal: Negative.  Negative for back pain.  Skin: Negative.   Neurological: Negative for dizziness, tremors, speech change, focal weakness, seizures and headaches.  Endo/Heme/Allergies: Negative.  Does not bruise/bleed easily.  Psychiatric/Behavioral: Negative.  Negative for depression, hallucinations and suicidal ideas.     PHYSICAL EXAMINATION:  GENERAL:  83 y.o.-year-old patient lying in the bed with no acute distress.  NECK:  Supple, no jugular venous distention. No thyroid enlargement, no tenderness.  LUNGS: Normal breath sounds bilaterally, no wheezing, rales,rhonchi  No use of accessory muscles of respiration.  CARDIOVASCULAR: S1, S2 normal. 2/6 SEM NO rubs, or gallops.  ABDOMEN: Soft, non-tender, non-distended. Bowel sounds present. No organomegaly or mass.   EXTREMITIES: 2+ edema, NO cyanosis, or clubbing.  PSYCHIATRIC: The patient is alert and oriented x 3.  SKIN: No obvious rash, lesion, or ulcer.   DATA REVIEW:   CBC  Recent Labs Lab 10/07/16 0722  WBC 6.2  HGB 9.3*  HCT 28.7*  PLT 189    Chemistries   Recent Labs Lab 10/07/16 0722  NA 134*  K 4.3  CL 99*  CO2 26  GLUCOSE 94  BUN 81*  CREATININE 2.81*  CALCIUM 8.7*    Cardiac Enzymes  Recent Labs Lab 10/05/16 1546 10/06/16 0107 10/06/16 0632  TROPONINI <0.03 <0.03 <0.03    Microbiology Results  @[email or web address] @  RADIOLOGY:  Dg Chest 2 View  Result Date: 10/05/2016 CLINICAL DATA:  Shortness of breath.  Lower extremity edema. EXAM: CHEST  2 VIEW COMPARISON:  08/24/2016 FINDINGS: Stable postsurgical changes from CABG. Cardiomediastinal silhouette is normal. Mediastinal contours appear intact. Calcific atherosclerotic disease and tortuosity of the aorta. There is no evidence of focal airspace consolidation, or pneumothorax. Small nodular density overlies the right lower thorax. Interstitial pulmonary edema. There is a small right and moderate left pleural effusion. Osseous structures are without acute abnormality. Soft tissues are grossly normal. IMPRESSION: Interstitial pulmonary edema with small right and moderate left pleural effusions. Subcentimeter nodular density overlies the right lower thorax. This may represent overlapping shadows, however pulmonary nodule cannot be excluded. Enlarged heart. Calcific atherosclerotic disease and tortuosity of the aorta. Electronically Signed   By: Fidela Salisbury M.D.   On: 10/05/2016 16:24      Current Discharge Medication List    CONTINUE these medications which have CHANGED   Details  insulin glargine (LANTUS) 100 UNIT/ML injection Inject 0.35 mLs (35 Units total) into the skin daily.      CONTINUE these medications which have NOT CHANGED   Details  acetaminophen (TYLENOL) 325 MG tablet Take 2 tablets (650 mg  total) by mouth every 6 (six) hours as needed for mild pain (or Fever >/= 101).    aspirin EC 81 MG tablet Take 1 tablet (81 mg total) by mouth daily. Qty: 90 tablet, Refills: 3    azelastine (OPTIVAR) 0.05 % ophthalmic solution Place 1 drop into the right eye 2 (two) times daily.    calcitRIOL (ROCALTROL) 0.25 MCG capsule TAKE ONE CAPSULE THREE TIMES A WEEK (MONDAY, WEDNESDAY, AND FRIDAY) Qty: 15 capsule, Refills: 5    carvedilol (COREG) 6.25 MG tablet Take 1 tablet (6.25 mg total) by mouth 2 (two) times daily. Qty: 180 tablet, Refills: 0  cholecalciferol (VITAMIN D) 1000 units tablet Take 1,000 Units by mouth daily.    escitalopram (LEXAPRO) 10 MG tablet TAKE 1 TABLET EVERY DAY WITH DINNER Qty: 30 tablet, Refills: 3    ferrous sulfate 325 (65 FE) MG tablet Take 1 tablet (325 mg total) by mouth 2 (two) times daily with a meal. Qty: 60 tablet, Refills: 3    HYDROcodone-acetaminophen (NORCO/VICODIN) 5-325 MG tablet Take 1 tablet by mouth daily as needed for moderate pain. DO NOT EXCEED 4GM OF APAP IN 24 HOURS FROM ALL SOURCES Qty: 15 tablet, Refills: 0   Associated Diagnoses: Arthritis    hydroxychloroquine (PLAQUENIL) 200 MG tablet Take 200 mg by mouth daily.    insulin aspart (NOVOLOG) 100 UNIT/ML injection Inject 4 Units into the skin 3 (three) times daily before meals. Qty: 10 mL, Refills: 11    isosorbide mononitrate (IMDUR) 30 MG 24 hr tablet Take 30 mg by mouth daily.    loratadine (CLARITIN) 10 MG tablet Take 10 mg by mouth daily.    Multiple Vitamin (MULTIVITAMIN WITH MINERALS) TABS tablet Take 1 tablet by mouth daily.    Multiple Vitamins-Minerals (PRESERVISION AREDS 2) CAPS Take 1 capsule by mouth 2 (two) times daily.    pantoprazole (PROTONIX) 40 MG tablet Take 1 tablet (40 mg total) by mouth daily. Qty: 90 tablet, Refills: 3    tamsulosin (FLOMAX) 0.4 MG CAPS capsule TAKE 1 CAPSULE BY MOUTH DAILY AFTER SUPPER Qty: 30 capsule, Refills: 5    torsemide (DEMADEX)  20 MG tablet Take 2 tablets (40 mg total) by mouth 2 (two) times daily. Qty: 60 tablet, Refills: 2    vitamin C (ASCORBIC ACID) 500 MG tablet Take 500 mg by mouth daily.    zolpidem (AMBIEN) 5 MG tablet Take 1 tablet (5 mg total) by mouth at bedtime as needed for sleep. Qty: 30 tablet, Refills: 5    atorvastatin (LIPITOR) 40 MG tablet Take 1 tablet (40 mg total) by mouth daily at 6 PM. Qty: 30 tablet, Refills: 2            Management plans discussed with the patient and he is in agreement. Stable for discharge home with [facility]  Patient should follow up with cardiology  CODE STATUS:     Code Status Orders        Start     Ordered   10/05/16 1851  Full code  Continuous     10/05/16 1850    Code Status History    Date Active Date Inactive Code Status Order ID Comments User Context   08/24/2016  9:54 PM 08/29/2016  7:42 PM Full Code [id number]  [place], DO ED   08/14/2016  7:22 PM 08/17/2016  2:19 PM Full Code [id number]  Fritzi Mandes, MD Inpatient   07/16/2016 11:39 PM 07/19/2016  3:53 PM Full Code [id number]  Harvie Bridge, DO Inpatient   06/19/2016  7:35 PM 06/21/2016  7:35 PM Partial Code [id number]  Theodoro Grist, MD ED   06/01/2016  6:21 PM 06/04/2016  6:52 PM Full Code [id number]  Shon Millet, DO Inpatient   05/08/2016  8:27 AM 05/16/2016  4:41 PM DNR [id number]  Everrett Coombe, MD Inpatient   05/02/2016  2:18 PM 05/08/2016  8:27 AM Full Code [id number]  Steve Rattler, DO Inpatient   05/02/2016  1:22 PM 05/02/2016  2:18 PM Partial Code [id number]  Elberta Leatherwood, MD ED   05/02/2016  1:12 PM 05/02/2016  1:22 PM DNR [id number]  Merrily Pew, MD  ED   03/22/2016  3:48 PM 03/24/2016  4:45 PM Full Code [id number]  Loletha Grayer, MD ED   09/19/2015  7:27 PM 09/21/2015  5:47 PM Full Code [id number]  Lytle Butte, MD ED   07/28/2015  6:39 PM 07/31/2015  4:57 PM Full Code [id number]  [name] Costa, MD ED   07/10/2015 11:37 PM 07/12/2015 11:15 PM Full Code [id number]  Rise Patience, MD  Inpatient   04/12/2015  7:05 PM 04/17/2015  7:10 PM Full Code [id number]  Geradine Girt, DO Inpatient   06/29/2014 12:24 PM 07/09/2014  2:15 PM Full Code [id number]  Bary Leriche, PA-C Inpatient    Advance Directive Documentation     Most Recent Value  Type of Advance Directive  Healthcare Power of Attorney, Living will  Pre-existing out of facility DNR order (yellow form or pink MOST form)  -  "MOST" Form in Place?  -      TOTAL TIME TAKING CARE OF THIS PATIENT: 37 minutes.    Note: This dictation was prepared with Dragon dictation along with smaller phrase technology. Any transcriptional errors that result from this process are unintentional.  Teofila Bowery M.D on 10/07/2016 at 10:21 AM  Between 7am to 6pm - Pager - [phone] After 6pm go to [email or web address] - password [email or web address] [facility]  Office  [phone]  CC: Primary care physician; [name], MD

## 2016-10-07 NOTE — Telephone Encounter (Signed)
Attempted to contact pt.  Line busy x 3.

## 2016-10-07 NOTE — Telephone Encounter (Signed)
[name] [phone] called from [facility] pt is being discharged from hospital today. Dx was Chronic CHF. No appt avail to sch. Pt needs to be seen in 1 week. Thank you!

## 2016-10-07 NOTE — Progress Notes (Signed)
Pt. Slept off and on throughout the night getting up to urinate. Continued to run A-fib on monitor.

## 2016-10-07 NOTE — Telephone Encounter (Signed)
Attempted to contact pt for TCM call and per Dr. Rockey Situ, find out what [facility] agency he uses. No answer, vm memory is full.

## 2016-10-07 NOTE — Telephone Encounter (Signed)
-----   Message from Blain Pais sent at 10/07/2016 10:54 AM EDT ----- Regarding: tcm/ph 5/4 3:20 Dr. Rockey Situ

## 2016-10-07 NOTE — Progress Notes (Signed)
Patient [name] Date of Encounter: 10/07/2016  Primary Cardiologist: [facility] Problem List     Active Problems:   Acute on chronic diastolic CHF (congestive heart failure) (HCC)   Acute posthemorrhagic anemia     Subjective   SOB much improved. UOP 1.6 L for the past 24 hours. Weight up 2 pounds today at 247 from admission weight of 245 pounds. CBC and renal function pending at this time. Notes improved LE swelling. Wants to go home.   Inpatient Medications    Scheduled Meds: . atorvastatin  40 mg Oral q1800  . carvedilol  6.25 mg Oral BID  . escitalopram  10 mg Oral QHS  . feeding supplement (ENSURE ENLIVE)  237 mL Oral Q24H  . ferrous sulfate  325 mg Oral BID WC  . furosemide  60 mg Intravenous BID  . hydroxychloroquine  200 mg Oral Daily  . insulin aspart  0-5 Units Subcutaneous QHS  . insulin aspart  0-9 Units Subcutaneous TID WC  . insulin aspart  4 Units Subcutaneous TID AC  . insulin glargine  30 Units Subcutaneous Daily  . isosorbide mononitrate  30 mg Oral Daily  . ketotifen  1 drop Right Eye BID  . loratadine  10 mg Oral Daily  . multivitamin with minerals  1 tablet Oral Daily  . pantoprazole (PROTONIX) IV  40 mg Intravenous Q12H  . sodium chloride flush  3 mL Intravenous Q12H  . tamsulosin  0.4 mg Oral Daily   Continuous Infusions: . sodium chloride     PRN Meds: sodium chloride, acetaminophen, HYDROcodone-acetaminophen, ondansetron **OR** ondansetron (ZOFRAN) IV, sodium chloride flush, zolpidem   Vital Signs    Vitals:   10/06/16 0756 10/06/16 1357 10/06/16 2049 10/07/16 0349  BP: (!) 150/49 (!) 117/45 (!) 147/59 (!) 139/56  Pulse: (!) 58 (!) 58 (!) 55 (!) 57  Resp: 18 20 18 18   Temp: 97.9 F (36.6 C) 98.2 F (36.8 C) 97.7 F (36.5 C) 97.5 F (36.4 C)  TempSrc: Oral  Oral Oral  SpO2: 97% 91% 100% 97%  Weight:    247 lb 9.2 oz (112.3 kg)  Height:        Intake/Output Summary (Last 24 hours) at 10/07/16 0738 Last  data filed at 10/07/16 0403  Gross per 24 hour  Intake              840 ml  Output             2525 ml  Net            -1685 ml   Filed Weights   10/05/16 1904 10/06/16 0500 10/07/16 0349  Weight: 245 lb 9.6 oz (111.4 kg) 245 lb (111.1 kg) 247 lb 9.2 oz (112.3 kg)    Physical Exam    GEN: Well nourished, well developed, in no acute distress.  HEENT: Grossly normal.  Neck: Supple, no JVD, carotid bruits, or masses. Cardiac: Irregularly irregular, no murmurs, rubs, or gallops. No clubbing, cyanosis, trace pre-tibial edema.  Radials/DP/PT 2+ and equal bilaterally.  Respiratory:  Respirations regular and unlabored, clear to auscultation bilaterally. GI: Soft, nontender, nondistended, BS + x 4. MS: no deformity or atrophy. Skin: warm and dry, no rash. Neuro:  Strength and sensation are intact. Psych: AAOx3.  Normal affect.  Labs    CBC  Recent Labs  10/05/16 1546  10/06/16 0632 10/06/16 1424  WBC 4.8  --  5.1  --   HGB 7.7*  < >  9.3* 8.7*  HCT 23.3*  --  28.0* 26.7*  MCV 85.7  --  85.8  --   PLT 193  --  179  --   < > = values in this interval not displayed. Basic Metabolic Panel  Recent Labs  10/05/16 1546 10/06/16 0632  NA 134* 131*  K 4.3 4.5  CL 99* 98*  CO2 24 25  GLUCOSE 140* 320*  BUN 77* 72*  CREATININE 3.10* 2.69*  CALCIUM 8.9 8.8*   Liver Function Tests No results for input(s): AST, ALT, ALKPHOS, BILITOT, PROT, ALBUMIN in the last 72 hours. No results for input(s): LIPASE, AMYLASE in the last 72 hours. Cardiac Enzymes  Recent Labs  10/05/16 1546 10/06/16 0107 10/06/16 0632  TROPONINI <0.03 <0.03 <0.03   BNP Invalid input(s): POCBNP D-Dimer No results for input(s): DDIMER in the last 72 hours. Hemoglobin A1C No results for input(s): HGBA1C in the last 72 hours. Fasting Lipid Panel No results for input(s): CHOL, HDL, LDLCALC, TRIG, CHOLHDL, LDLDIRECT in the last 72 hours. Thyroid Function Tests No results for input(s): TSH, T4TOTAL,  T3FREE, THYROIDAB in the last 72 hours.  Invalid input(s): FREET3  Telemetry    Afib, PVCs - Personally Reviewed  ECG    n/a - Personally Reviewed  Radiology    Dg Chest 2 View  Result Date: 10/05/2016 CLINICAL DATA:  Shortness of breath.  Lower extremity edema. EXAM: CHEST  2 VIEW COMPARISON:  08/24/2016 FINDINGS: Stable postsurgical changes from CABG. Cardiomediastinal silhouette is normal. Mediastinal contours appear intact. Calcific atherosclerotic disease and tortuosity of the aorta. There is no evidence of focal airspace consolidation, or pneumothorax. Small nodular density overlies the right lower thorax. Interstitial pulmonary edema. There is a small right and moderate left pleural effusion. Osseous structures are without acute abnormality. Soft tissues are grossly normal. IMPRESSION: Interstitial pulmonary edema with small right and moderate left pleural effusions. Subcentimeter nodular density overlies the right lower thorax. This may represent overlapping shadows, however pulmonary nodule cannot be excluded. Enlarged heart. Calcific atherosclerotic disease and tortuosity of the aorta. Electronically Signed   By: Fidela Salisbury M.D.   On: 10/05/2016 16:24    Cardiac Studies   Echo 08/26/16: Study Conclusions  - Left ventricle: The cavity size was normal. There was moderate concentric hypertrophy. Systolic function was normal. The estimated ejection fraction was in the range of 55% to 60%. Images were inadequate for LV wall motion assessment. The study was not technically sufficient to allow evaluation of LV diastolic dysfunction due to atrial fibrillation. - Aortic valve: A bioprosthesis was present and functioning normally. Mean gradient (S): 9 mm Hg. Peak gradient (S): 17 mm Hg. Valve area (VTI): 1.38 cm^2. - Mitral valve: Calcified annulus. - Left atrium: The atrium was moderately dilated. - Right atrium: The atrium was mildly dilated. - Pulmonary  arteries: Systolic pressure was severely increased. PA peak pressure: 70 mm Hg (S). - Inferior vena cava: Not visualized.  Patient Profile     83 y.o. male with history of chronic diastolic heart failure, aortic stenosis s/p bioprosthetic AVR in 2009, CAD s/p 1v CABG in 2009, longstanding persistent atrial fibrillation s/p Watchman, prior stroke, CKD IV, chronic GI bleed 2/2 AVMs, iron deficiency anemia, and pulmonary hypertension here with acute on chronic diastolic heart failure and melena.  Assessment & Plan    1. Acute respiratory distress: -Likely multifactorial including acute on chronic diastolic CHF and upper Gi bleed -SOB much improved -On room air  2. Acute on chronic diastolic  CHF: -Improving -Weight up 2 pounds at 247 -Labs pending this morning -Continue IV Lasix this morning, possible transition this evening or 5/1 to PO  -Coreg and Imdur  3. Acute on CKD stage III: -Improving with pRBC transfusion and diuresis   4. Hypertensive heart disease: -Improved -Continue current medications  5. Upper GI bleed: -HGB improved -Await CBC this AM  6. Persistent Afib: -Status post Watchman device -Not on anticoagulation 2/2 prior GI bleeding with AVM  7. CAD: -No symptoms of ischemia -Continue current medical therapy -ASA on hold 2/2 upper GI bleed  8. HLD: -Lipitor   Signed, Christell Faith, PA-C [facility] Pager: [phone] 10/07/2016, 7:38 AM

## 2016-10-08 DIAGNOSIS — N183 Chronic kidney disease, stage 3 (moderate): Secondary | ICD-10-CM | POA: Diagnosis not present

## 2016-10-08 DIAGNOSIS — I2510 Atherosclerotic heart disease of native coronary artery without angina pectoris: Secondary | ICD-10-CM | POA: Diagnosis not present

## 2016-10-08 DIAGNOSIS — I5032 Chronic diastolic (congestive) heart failure: Secondary | ICD-10-CM | POA: Diagnosis not present

## 2016-10-08 DIAGNOSIS — I130 Hypertensive heart and chronic kidney disease with heart failure and stage 1 through stage 4 chronic kidney disease, or unspecified chronic kidney disease: Secondary | ICD-10-CM | POA: Diagnosis not present

## 2016-10-08 DIAGNOSIS — E1122 Type 2 diabetes mellitus with diabetic chronic kidney disease: Secondary | ICD-10-CM | POA: Diagnosis not present

## 2016-10-08 DIAGNOSIS — D631 Anemia in chronic kidney disease: Secondary | ICD-10-CM | POA: Diagnosis not present

## 2016-10-08 NOTE — Telephone Encounter (Signed)
Not needed if cardiology is doing the TCM.  Schedule follow up in 3-4 weeks  \ Lab Results  Component Value Date   HGBA1C 6.6 (H) 08/25/2016

## 2016-10-08 NOTE — Telephone Encounter (Signed)
Patient contacted regarding discharge from [facility] on 10/07/16.  Patient understands to follow up with Dr. Rockey Situ on 10/11/16 at 3:20 at [facility]. Patient understands discharge instructions? yes Patient understands medications and regiment? yes Patient understands to bring all medications to this visit? yes  Pt uses [facility] for his home health needs.

## 2016-10-08 NOTE — Telephone Encounter (Signed)
Cardiology has TCM on patient would PCP like HFU visit?

## 2016-10-09 ENCOUNTER — Telehealth: Payer: Self-pay

## 2016-10-09 DIAGNOSIS — I5032 Chronic diastolic (congestive) heart failure: Secondary | ICD-10-CM | POA: Diagnosis not present

## 2016-10-09 DIAGNOSIS — D631 Anemia in chronic kidney disease: Secondary | ICD-10-CM | POA: Diagnosis not present

## 2016-10-09 DIAGNOSIS — I130 Hypertensive heart and chronic kidney disease with heart failure and stage 1 through stage 4 chronic kidney disease, or unspecified chronic kidney disease: Secondary | ICD-10-CM | POA: Diagnosis not present

## 2016-10-09 DIAGNOSIS — E1122 Type 2 diabetes mellitus with diabetic chronic kidney disease: Secondary | ICD-10-CM | POA: Diagnosis not present

## 2016-10-09 DIAGNOSIS — I2510 Atherosclerotic heart disease of native coronary artery without angina pectoris: Secondary | ICD-10-CM | POA: Diagnosis not present

## 2016-10-09 DIAGNOSIS — N183 Chronic kidney disease, stage 3 (moderate): Secondary | ICD-10-CM | POA: Diagnosis not present

## 2016-10-09 NOTE — Telephone Encounter (Signed)
Spoke with Manuela Schwartz about scheduling a hospital follow up appointment and she declined the appointment stating that getting out is too much for the patient at this time.

## 2016-10-10 ENCOUNTER — Telehealth: Payer: Self-pay | Admitting: Internal Medicine

## 2016-10-10 DIAGNOSIS — I5032 Chronic diastolic (congestive) heart failure: Secondary | ICD-10-CM | POA: Diagnosis not present

## 2016-10-10 DIAGNOSIS — E1122 Type 2 diabetes mellitus with diabetic chronic kidney disease: Secondary | ICD-10-CM | POA: Diagnosis not present

## 2016-10-10 DIAGNOSIS — I130 Hypertensive heart and chronic kidney disease with heart failure and stage 1 through stage 4 chronic kidney disease, or unspecified chronic kidney disease: Secondary | ICD-10-CM | POA: Diagnosis not present

## 2016-10-10 DIAGNOSIS — D631 Anemia in chronic kidney disease: Secondary | ICD-10-CM | POA: Diagnosis not present

## 2016-10-10 DIAGNOSIS — I2510 Atherosclerotic heart disease of native coronary artery without angina pectoris: Secondary | ICD-10-CM | POA: Diagnosis not present

## 2016-10-10 DIAGNOSIS — N183 Chronic kidney disease, stage 3 (moderate): Secondary | ICD-10-CM | POA: Diagnosis not present
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
## 2016-10-10 NOTE — Telephone Encounter (Signed)
Can you please schedule 3-4 weeks out for  Follow up appt, thanks

## 2016-10-10 NOTE — Telephone Encounter (Signed)
Amy from [facility] called and stated that she needs parameters on pt's IV lasix. Would you like to just send parameters or have them send orders over. Please advise, thank you!  Call Amy @ [phone]

## 2016-10-10 NOTE — Telephone Encounter (Signed)
Scheduled for 5/30 @ 1:30

## 2016-10-11 ENCOUNTER — Encounter: Payer: Self-pay | Admitting: Cardiovascular Disease

## 2016-10-11 ENCOUNTER — Ambulatory Visit (INDEPENDENT_AMBULATORY_CARE_PROVIDER_SITE_OTHER): Payer: Medicare Other | Admitting: Cardiovascular Disease

## 2016-10-11 VITALS — BP 120/58 | HR 64 | Ht 70.0 in | Wt 252.0 lb

## 2016-10-11 DIAGNOSIS — I482 Chronic atrial fibrillation, unspecified: Secondary | ICD-10-CM

## 2016-10-11 DIAGNOSIS — I5033 Acute on chronic diastolic (congestive) heart failure: Secondary | ICD-10-CM

## 2016-10-11 DIAGNOSIS — I2510 Atherosclerotic heart disease of native coronary artery without angina pectoris: Secondary | ICD-10-CM

## 2016-10-11 DIAGNOSIS — I5022 Chronic systolic (congestive) heart failure: Secondary | ICD-10-CM

## 2016-10-11 DIAGNOSIS — N184 Chronic kidney disease, stage 4 (severe): Secondary | ICD-10-CM | POA: Diagnosis not present

## 2016-10-11 DIAGNOSIS — Z953 Presence of xenogenic heart valve: Secondary | ICD-10-CM

## 2016-10-11 DIAGNOSIS — I110 Hypertensive heart disease with heart failure: Secondary | ICD-10-CM

## 2016-10-11 NOTE — Telephone Encounter (Signed)
Don't they have a chf protocol?  They used to .  Send waht they have

## 2016-10-11 NOTE — Telephone Encounter (Signed)
LMTCB

## 2016-10-11 NOTE — Telephone Encounter (Signed)
Please advise 

## 2016-10-11 NOTE — Patient Instructions (Addendum)
Medication Instructions:   cetirazine /zyrtec for allergy  Please increase the torsemide up to 3 pills twice a day (11 am and 4 pm) for 3 days  Then Take torsemide 2 pills in the Am and 2 pills in the PM   Labwork:  No new labs needed  Testing/Procedures:  No further testing at this time   I recommend watching educational videos on topics of interest to you at:       [email or web address]  Enter code: [email or web address]    Follow-Up: It was a pleasure seeing you in the office today. Please call us if you have new issues that need to be addressed before your next appt.  [phone]  Your physician wants you to follow-up in: 2 weeks  If you need a refill on your cardiac medications before your next appointment, please call your pharmacy.

## 2016-10-11 NOTE — Telephone Encounter (Signed)
Spoken to [name]. She is sending the order over for Dr. Derrel Nip to sign.

## 2016-10-14 DIAGNOSIS — D631 Anemia in chronic kidney disease: Secondary | ICD-10-CM | POA: Diagnosis not present

## 2016-10-14 DIAGNOSIS — I130 Hypertensive heart and chronic kidney disease with heart failure and stage 1 through stage 4 chronic kidney disease, or unspecified chronic kidney disease: Secondary | ICD-10-CM | POA: Diagnosis not present

## 2016-10-14 DIAGNOSIS — I2510 Atherosclerotic heart disease of native coronary artery without angina pectoris: Secondary | ICD-10-CM | POA: Diagnosis not present

## 2016-10-14 DIAGNOSIS — N183 Chronic kidney disease, stage 3 (moderate): Secondary | ICD-10-CM | POA: Diagnosis not present

## 2016-10-14 DIAGNOSIS — I5032 Chronic diastolic (congestive) heart failure: Secondary | ICD-10-CM | POA: Diagnosis not present

## 2016-10-14 DIAGNOSIS — E1122 Type 2 diabetes mellitus with diabetic chronic kidney disease: Secondary | ICD-10-CM | POA: Diagnosis not present

## 2016-10-15 DIAGNOSIS — D631 Anemia in chronic kidney disease: Secondary | ICD-10-CM | POA: Diagnosis not present

## 2016-10-15 DIAGNOSIS — I129 Hypertensive chronic kidney disease with stage 1 through stage 4 chronic kidney disease, or unspecified chronic kidney disease: Secondary | ICD-10-CM | POA: Diagnosis not present

## 2016-10-15 DIAGNOSIS — N2581 Secondary hyperparathyroidism of renal origin: Secondary | ICD-10-CM | POA: Diagnosis not present

## 2016-10-15 DIAGNOSIS — N184 Chronic kidney disease, stage 4 (severe): Secondary | ICD-10-CM | POA: Diagnosis not present

## 2016-10-15 DIAGNOSIS — E1122 Type 2 diabetes mellitus with diabetic chronic kidney disease: Secondary | ICD-10-CM | POA: Diagnosis not present

## 2016-10-15 DIAGNOSIS — R809 Proteinuria, unspecified: Secondary | ICD-10-CM | POA: Diagnosis not present

## 2016-10-16 ENCOUNTER — Telehealth: Payer: Self-pay | Admitting: Internal Medicine

## 2016-10-16 DIAGNOSIS — N183 Chronic kidney disease, stage 3 (moderate): Secondary | ICD-10-CM | POA: Diagnosis not present

## 2016-10-16 DIAGNOSIS — I2510 Atherosclerotic heart disease of native coronary artery without angina pectoris: Secondary | ICD-10-CM | POA: Diagnosis not present

## 2016-10-16 DIAGNOSIS — I5032 Chronic diastolic (congestive) heart failure: Secondary | ICD-10-CM | POA: Diagnosis not present

## 2016-10-16 DIAGNOSIS — D631 Anemia in chronic kidney disease: Secondary | ICD-10-CM | POA: Diagnosis not present

## 2016-10-16 DIAGNOSIS — I130 Hypertensive heart and chronic kidney disease with heart failure and stage 1 through stage 4 chronic kidney disease, or unspecified chronic kidney disease: Secondary | ICD-10-CM | POA: Diagnosis not present

## 2016-10-16 DIAGNOSIS — E1122 Type 2 diabetes mellitus with diabetic chronic kidney disease: Secondary | ICD-10-CM | POA: Diagnosis not present

## 2016-10-16 NOTE — Telephone Encounter (Signed)
His nursing home health agency has standing orders to give furosemide IV for a weight gain of 3 lbs overnight.  I signed the orders today , so they should be doing ASAP if they will not be out until tomorrow and  patient is short or breath, he may need to go to the ER.  If he is not short of breath he can take the aldactone and torsemide tonight

## 2016-10-16 NOTE — Telephone Encounter (Signed)
[facility] nurse Alana called stating that the patient has had a 3 lb. Weight gain from 239 to 242 . Today he is at 2. His urologist has put the patient on Aldactone 25mg  and Torsemide. Please advise.

## 2016-10-16 NOTE — Telephone Encounter (Signed)
Spoke with Dr Derrel Nip and she told me to call Margaretha Sheffield back and instruct her not to start furosemide IV due to patient had just started torosemide 40 mg yesterday. Also patient wasn't having any worsening symptoms. Elania with [facility] verbalized understanding. She will call after getting update with patient tomorrow .

## 2016-10-16 NOTE — Telephone Encounter (Signed)
Spoke with Elaina  lungs were a little diminshed in bases no rales or wheezing.   O2 99%.  Vitals were fine BP 132/72 HR 72.    Asymptomic.  Half point swelling in ankles.   No pitting edema.  No shortness of breath.   He has been instructed if any issues to call .  Elaina advised to do the furosemide IV as ordered below.

## 2016-10-17 ENCOUNTER — Telehealth: Payer: Self-pay | Admitting: *Deleted

## 2016-10-17 ENCOUNTER — Encounter: Payer: Self-pay | Admitting: Hematology and Oncology

## 2016-10-17 NOTE — Telephone Encounter (Signed)
Patient's care taker reported that insurance will not cover the Rx for lantus, pt will need another Rx .

## 2016-10-18 ENCOUNTER — Telehealth: Payer: Self-pay | Admitting: *Deleted

## 2016-10-18 DIAGNOSIS — I130 Hypertensive heart and chronic kidney disease with heart failure and stage 1 through stage 4 chronic kidney disease, or unspecified chronic kidney disease: Secondary | ICD-10-CM | POA: Diagnosis not present

## 2016-10-18 DIAGNOSIS — E1122 Type 2 diabetes mellitus with diabetic chronic kidney disease: Secondary | ICD-10-CM | POA: Diagnosis not present

## 2016-10-18 DIAGNOSIS — D631 Anemia in chronic kidney disease: Secondary | ICD-10-CM | POA: Diagnosis not present

## 2016-10-18 DIAGNOSIS — I2510 Atherosclerotic heart disease of native coronary artery without angina pectoris: Secondary | ICD-10-CM | POA: Diagnosis not present

## 2016-10-18 DIAGNOSIS — I5032 Chronic diastolic (congestive) heart failure: Secondary | ICD-10-CM | POA: Diagnosis not present

## 2016-10-18 DIAGNOSIS — N183 Chronic kidney disease, stage 3 (moderate): Secondary | ICD-10-CM | POA: Diagnosis not present

## 2016-10-18 NOTE — Telephone Encounter (Signed)
[name] from [facility] reported that patient has declined occupational therapy,however he will continue physical therapy. [name] [phone]

## 2016-10-18 NOTE — Telephone Encounter (Signed)
FYI

## 2016-10-18 NOTE — Telephone Encounter (Signed)
Levemir is a tier 3 medication that his insurance may cover or Basaglar, I will attempt PA on Lantus . PA in process.

## 2016-10-19 ENCOUNTER — Inpatient Hospital Stay (HOSPITAL_COMMUNITY)
Admission: EM | Admit: 2016-10-19 | Discharge: 2016-10-22 | DRG: 312 | Disposition: A | Discharge status: Skilled Nursing Facility | Payer: Medicare Other | Attending: Family Medicine | Admitting: Family Medicine

## 2016-10-19 ENCOUNTER — Encounter (HOSPITAL_COMMUNITY): Payer: Self-pay | Admitting: *Deleted

## 2016-10-19 ENCOUNTER — Emergency Department (HOSPITAL_COMMUNITY): Payer: Medicare Other

## 2016-10-19 DIAGNOSIS — I482 Chronic atrial fibrillation, unspecified: Secondary | ICD-10-CM | POA: Diagnosis present

## 2016-10-19 DIAGNOSIS — N184 Chronic kidney disease, stage 4 (severe): Secondary | ICD-10-CM | POA: Diagnosis present

## 2016-10-19 DIAGNOSIS — Z9109 Other allergy status, other than to drugs and biological substances: Secondary | ICD-10-CM

## 2016-10-19 DIAGNOSIS — Y92091 Bathroom in other non-institutional residence as the place of occurrence of the external cause: Secondary | ICD-10-CM

## 2016-10-19 DIAGNOSIS — I69354 Hemiplegia and hemiparesis following cerebral infarction affecting left non-dominant side: Secondary | ICD-10-CM | POA: Diagnosis not present

## 2016-10-19 DIAGNOSIS — S0003XA Contusion of scalp, initial encounter: Secondary | ICD-10-CM | POA: Diagnosis not present

## 2016-10-19 DIAGNOSIS — S0990XA Unspecified injury of head, initial encounter: Secondary | ICD-10-CM | POA: Diagnosis not present

## 2016-10-19 DIAGNOSIS — W1830XA Fall on same level, unspecified, initial encounter: Secondary | ICD-10-CM | POA: Diagnosis present

## 2016-10-19 DIAGNOSIS — Z7982 Long term (current) use of aspirin: Secondary | ICD-10-CM

## 2016-10-19 DIAGNOSIS — Z794 Long term (current) use of insulin: Secondary | ICD-10-CM

## 2016-10-19 DIAGNOSIS — Z953 Presence of xenogenic heart valve: Secondary | ICD-10-CM

## 2016-10-19 DIAGNOSIS — R55 Syncope and collapse: Principal | ICD-10-CM | POA: Diagnosis present

## 2016-10-19 DIAGNOSIS — M069 Rheumatoid arthritis, unspecified: Secondary | ICD-10-CM | POA: Diagnosis present

## 2016-10-19 DIAGNOSIS — L8990 Pressure ulcer of unspecified site, unspecified stage: Secondary | ICD-10-CM | POA: Diagnosis present

## 2016-10-19 DIAGNOSIS — E1122 Type 2 diabetes mellitus with diabetic chronic kidney disease: Secondary | ICD-10-CM | POA: Diagnosis present

## 2016-10-19 DIAGNOSIS — Z8719 Personal history of other diseases of the digestive system: Secondary | ICD-10-CM

## 2016-10-19 DIAGNOSIS — D62 Acute posthemorrhagic anemia: Secondary | ICD-10-CM | POA: Diagnosis present

## 2016-10-19 DIAGNOSIS — E11649 Type 2 diabetes mellitus with hypoglycemia without coma: Secondary | ICD-10-CM | POA: Diagnosis present

## 2016-10-19 DIAGNOSIS — Z880 Allergy status to penicillin: Secondary | ICD-10-CM

## 2016-10-19 DIAGNOSIS — T148XXA Other injury of unspecified body region, initial encounter: Secondary | ICD-10-CM

## 2016-10-19 DIAGNOSIS — Z884 Allergy status to anesthetic agent status: Secondary | ICD-10-CM

## 2016-10-19 DIAGNOSIS — W19XXXA Unspecified fall, initial encounter: Secondary | ICD-10-CM

## 2016-10-19 DIAGNOSIS — R58 Hemorrhage, not elsewhere classified: Secondary | ICD-10-CM | POA: Diagnosis not present

## 2016-10-19 DIAGNOSIS — Z79899 Other long term (current) drug therapy: Secondary | ICD-10-CM

## 2016-10-19 DIAGNOSIS — S8002XA Contusion of left knee, initial encounter: Secondary | ICD-10-CM | POA: Diagnosis present

## 2016-10-19 DIAGNOSIS — F1010 Alcohol abuse, uncomplicated: Secondary | ICD-10-CM | POA: Diagnosis present

## 2016-10-19 DIAGNOSIS — M542 Cervicalgia: Secondary | ICD-10-CM | POA: Diagnosis not present

## 2016-10-19 DIAGNOSIS — Z951 Presence of aortocoronary bypass graft: Secondary | ICD-10-CM

## 2016-10-19 DIAGNOSIS — I130 Hypertensive heart and chronic kidney disease with heart failure and stage 1 through stage 4 chronic kidney disease, or unspecified chronic kidney disease: Secondary | ICD-10-CM | POA: Diagnosis not present

## 2016-10-19 DIAGNOSIS — I2510 Atherosclerotic heart disease of native coronary artery without angina pectoris: Secondary | ICD-10-CM | POA: Diagnosis present

## 2016-10-19 DIAGNOSIS — J449 Chronic obstructive pulmonary disease, unspecified: Secondary | ICD-10-CM | POA: Diagnosis present

## 2016-10-19 DIAGNOSIS — Z87891 Personal history of nicotine dependence: Secondary | ICD-10-CM

## 2016-10-19 DIAGNOSIS — S8001XA Contusion of right knee, initial encounter: Secondary | ICD-10-CM | POA: Diagnosis not present

## 2016-10-19 DIAGNOSIS — M25561 Pain in right knee: Secondary | ICD-10-CM | POA: Diagnosis not present

## 2016-10-19 DIAGNOSIS — S0101XA Laceration without foreign body of scalp, initial encounter: Secondary | ICD-10-CM | POA: Diagnosis present

## 2016-10-19 DIAGNOSIS — E785 Hyperlipidemia, unspecified: Secondary | ICD-10-CM | POA: Diagnosis present

## 2016-10-19 DIAGNOSIS — D649 Anemia, unspecified: Secondary | ICD-10-CM

## 2016-10-19 DIAGNOSIS — E162 Hypoglycemia, unspecified: Secondary | ICD-10-CM | POA: Diagnosis present

## 2016-10-19 DIAGNOSIS — R296 Repeated falls: Secondary | ICD-10-CM

## 2016-10-19 DIAGNOSIS — I5032 Chronic diastolic (congestive) heart failure: Secondary | ICD-10-CM

## 2016-10-19 DIAGNOSIS — I5033 Acute on chronic diastolic (congestive) heart failure: Secondary | ICD-10-CM | POA: Diagnosis present

## 2016-10-19 DIAGNOSIS — Z8551 Personal history of malignant neoplasm of bladder: Secondary | ICD-10-CM

## 2016-10-19 LAB — URINALYSIS, ROUTINE W REFLEX MICROSCOPIC
Bacteria, UA: NONE SEEN
Bilirubin Urine: NEGATIVE
GLUCOSE, UA: NEGATIVE mg/dL
Hgb urine dipstick: NEGATIVE
Ketones, ur: NEGATIVE mg/dL
Leukocytes, UA: NEGATIVE
NITRITE: NEGATIVE
PH: 5 (ref 5.0–8.0)
Protein, ur: 30 mg/dL — AB
Specific Gravity, Urine: 1.009 (ref 1.005–1.030)
Squamous Epithelial / LPF: NONE SEEN

## 2016-10-19 LAB — COMPREHENSIVE METABOLIC PANEL
ALBUMIN: 3.3 g/dL — AB (ref 3.5–5.0)
ALK PHOS: 94 U/L (ref 38–126)
ALT: 17 U/L (ref 17–63)
ANION GAP: 11 (ref 5–15)
AST: 29 U/L (ref 15–41)
BILIRUBIN TOTAL: 0.9 mg/dL (ref 0.3–1.2)
BUN: 57 mg/dL — AB (ref 6–20)
CALCIUM: 8.7 mg/dL — AB (ref 8.9–10.3)
CO2: 24 mmol/L (ref 22–32)
Chloride: 97 mmol/L — ABNORMAL LOW (ref 101–111)
Creatinine, Ser: 2.81 mg/dL — ABNORMAL HIGH (ref 0.61–1.24)
GFR calc Af Amer: 22 mL/min — ABNORMAL LOW (ref >60)
GFR calc non Af Amer: 19 mL/min — ABNORMAL LOW (ref >60)
GLUCOSE: 91 mg/dL (ref 65–99)
Potassium: 4.4 mmol/L (ref 3.5–5.1)
SODIUM: 132 mmol/L — AB (ref 135–145)
Total Protein: 6.1 g/dL — ABNORMAL LOW (ref 6.5–8.1)

## 2016-10-19 LAB — CBC WITH DIFFERENTIAL/PLATELET
Basophils Absolute: 0 10*3/uL (ref 0.0–0.1)
Basophils Relative: 0 %
Eosinophils Absolute: 0.1 10*3/uL (ref 0.0–0.7)
Eosinophils Relative: 2 %
HEMATOCRIT: 23 % — AB (ref 39.0–52.0)
HEMOGLOBIN: 7.3 g/dL — AB (ref 13.0–17.0)
LYMPHS ABS: 0.6 10*3/uL — AB (ref 0.7–4.0)
LYMPHS PCT: 12 %
MCH: 27.4 pg (ref 26.0–34.0)
MCHC: 31.7 g/dL (ref 30.0–36.0)
MCV: 86.5 fL (ref 78.0–100.0)
MONO ABS: 0.5 10*3/uL (ref 0.1–1.0)
MONOS PCT: 11 %
NEUTROS ABS: 3.5 10*3/uL (ref 1.7–7.7)
Neutrophils Relative %: 75 %
Platelets: 169 10*3/uL (ref 150–400)
RBC: 2.66 MIL/uL — ABNORMAL LOW (ref 4.22–5.81)
RDW: 15.7 % — AB (ref 11.5–15.5)
WBC: 4.8 10*3/uL (ref 4.0–10.5)

## 2016-10-19 LAB — ETHANOL: Alcohol, Ethyl (B): <5 mg/dL (ref <5)

## 2016-10-19 LAB — PREPARE RBC (CROSSMATCH)

## 2016-10-19 LAB — BRAIN NATRIURETIC PEPTIDE: B Natriuretic Peptide: 296.3 pg/mL — ABNORMAL HIGH (ref 0.0–100.0)

## 2016-10-19 LAB — CBG MONITORING, ED: Glucose-Capillary: 95 mg/dL (ref 65–99)

## 2016-10-19 LAB — I-STAT TROPONIN, ED: TROPONIN I, POC: 0.05 ng/mL (ref 0.00–0.08)

## 2016-10-19 LAB — CK: Total CK: 147 U/L (ref 49–397)

## 2016-10-19 MED ORDER — SODIUM CHLORIDE 0.9 % IV SOLN
10.0000 mL/h | Freq: Once | INTRAVENOUS | Status: AC
  Administered 2016-10-20: 10 mL/h via INTRAVENOUS

## 2016-10-19 NOTE — ED Provider Notes (Signed)
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  Clinical Course as of Oct 20 224  Sat Oct 19, 2016  2310 Symptomatic.  Will transfuse. Hemoglobin: (!) 7.3 [HM]  Sun Oct 20, 2016  0032 Pt's daughter is in town and can be reached at [phone]  [HM]    Clinical Course User Index [HM] Yee Joss, Jarrett [place], [place]    Patient presents after fall 2 today and syncope this evening. Unknown down time. Patient was last seen well at 5 PM. Patient with progressing anemia at 7.3 today. Symptomatically syncope and shortness of breath. Discussed with patient and he wishes for transfusion.  CT head without acute cranial injury and no intracranial hemorrhage. Patient has been baseline mental status since his arrival in the emergency department. Nonfocal neurologic exam. No evidence of CVA tonight. Patient and family have been updated on plan of care. Patient will be admitted for transfusion and further evaluation.  The patient was discussed with and seen by Dr. Vanita Panda who agrees with the treatment plan.   Final  Clinical Impressions(s) / ED Diagnoses   Final diagnoses:  Fall, initial encounter  Abrasion  Contusion of scalp, initial encounter  Syncope, unspecified syncope type  Symptomatic  anemia    New Prescriptions Current Discharge Medication List       Agapito Games 10/20/16 2787    Carmin Muskrat, MD 10/22/16 Darlin Drop

## 2016-10-19 NOTE — ED Triage Notes (Signed)
Ems found  His cbg was 63 and they gave the pt 1/2 amp d 50 w

## 2016-10-19 NOTE — ED Triage Notes (Signed)
The pt arrived by ambulance [name]  He was found with his face down in the br  He has a help necklace and he lives alone.  Alert oriented on arrival to the ed  Skin warm and dry.  Iv per ems  Lac to the top of his head that is actively bleeding  Many bruises and skin tears on his upper extremities  No loc  Diminished lt chest

## 2016-10-20 ENCOUNTER — Inpatient Hospital Stay (HOSPITAL_COMMUNITY): Payer: Medicare Other

## 2016-10-20 DIAGNOSIS — D500 Iron deficiency anemia secondary to blood loss (chronic): Secondary | ICD-10-CM | POA: Diagnosis not present

## 2016-10-20 DIAGNOSIS — Z951 Presence of aortocoronary bypass graft: Secondary | ICD-10-CM | POA: Diagnosis not present

## 2016-10-20 DIAGNOSIS — S0101XA Laceration without foreign body of scalp, initial encounter: Secondary | ICD-10-CM | POA: Diagnosis present

## 2016-10-20 DIAGNOSIS — I5032 Chronic diastolic (congestive) heart failure: Secondary | ICD-10-CM | POA: Diagnosis not present

## 2016-10-20 DIAGNOSIS — Z884 Allergy status to anesthetic agent status: Secondary | ICD-10-CM | POA: Diagnosis not present

## 2016-10-20 DIAGNOSIS — I5033 Acute on chronic diastolic (congestive) heart failure: Secondary | ICD-10-CM | POA: Diagnosis not present

## 2016-10-20 DIAGNOSIS — Z9109 Other allergy status, other than to drugs and biological substances: Secondary | ICD-10-CM | POA: Diagnosis not present

## 2016-10-20 DIAGNOSIS — R296 Repeated falls: Secondary | ICD-10-CM

## 2016-10-20 DIAGNOSIS — I69334 Monoplegia of upper limb following cerebral infarction affecting left non-dominant side: Secondary | ICD-10-CM | POA: Diagnosis not present

## 2016-10-20 DIAGNOSIS — M6281 Muscle weakness (generalized): Secondary | ICD-10-CM | POA: Diagnosis not present

## 2016-10-20 DIAGNOSIS — Y92091 Bathroom in other non-institutional residence as the place of occurrence of the external cause: Secondary | ICD-10-CM | POA: Diagnosis not present

## 2016-10-20 DIAGNOSIS — D649 Anemia, unspecified: Secondary | ICD-10-CM

## 2016-10-20 DIAGNOSIS — N400 Enlarged prostate without lower urinary tract symptoms: Secondary | ICD-10-CM | POA: Diagnosis not present

## 2016-10-20 DIAGNOSIS — S8002XA Contusion of left knee, initial encounter: Secondary | ICD-10-CM | POA: Diagnosis present

## 2016-10-20 DIAGNOSIS — I130 Hypertensive heart and chronic kidney disease with heart failure and stage 1 through stage 4 chronic kidney disease, or unspecified chronic kidney disease: Secondary | ICD-10-CM | POA: Diagnosis present

## 2016-10-20 DIAGNOSIS — D62 Acute posthemorrhagic anemia: Secondary | ICD-10-CM

## 2016-10-20 DIAGNOSIS — M0589 Other rheumatoid arthritis with rheumatoid factor of multiple sites: Secondary | ICD-10-CM | POA: Diagnosis not present

## 2016-10-20 DIAGNOSIS — S0003XA Contusion of scalp, initial encounter: Secondary | ICD-10-CM

## 2016-10-20 DIAGNOSIS — I482 Chronic atrial fibrillation: Secondary | ICD-10-CM | POA: Diagnosis not present

## 2016-10-20 DIAGNOSIS — I69354 Hemiplegia and hemiparesis following cerebral infarction affecting left non-dominant side: Secondary | ICD-10-CM | POA: Diagnosis not present

## 2016-10-20 DIAGNOSIS — H353190 Nonexudative age-related macular degeneration, unspecified eye, stage unspecified: Secondary | ICD-10-CM | POA: Diagnosis not present

## 2016-10-20 DIAGNOSIS — Z79899 Other long term (current) drug therapy: Secondary | ICD-10-CM | POA: Diagnosis not present

## 2016-10-20 DIAGNOSIS — M542 Cervicalgia: Secondary | ICD-10-CM | POA: Diagnosis not present

## 2016-10-20 DIAGNOSIS — Z880 Allergy status to penicillin: Secondary | ICD-10-CM | POA: Diagnosis not present

## 2016-10-20 DIAGNOSIS — W1830XA Fall on same level, unspecified, initial encounter: Secondary | ICD-10-CM | POA: Diagnosis present

## 2016-10-20 DIAGNOSIS — D631 Anemia in chronic kidney disease: Secondary | ICD-10-CM | POA: Diagnosis not present

## 2016-10-20 DIAGNOSIS — Z8719 Personal history of other diseases of the digestive system: Secondary | ICD-10-CM

## 2016-10-20 DIAGNOSIS — J449 Chronic obstructive pulmonary disease, unspecified: Secondary | ICD-10-CM | POA: Diagnosis not present

## 2016-10-20 DIAGNOSIS — Z794 Long term (current) use of insulin: Secondary | ICD-10-CM | POA: Diagnosis not present

## 2016-10-20 DIAGNOSIS — R51 Headache: Secondary | ICD-10-CM | POA: Diagnosis not present

## 2016-10-20 DIAGNOSIS — E1165 Type 2 diabetes mellitus with hyperglycemia: Secondary | ICD-10-CM | POA: Diagnosis not present

## 2016-10-20 DIAGNOSIS — I5022 Chronic systolic (congestive) heart failure: Secondary | ICD-10-CM | POA: Diagnosis not present

## 2016-10-20 DIAGNOSIS — Z7982 Long term (current) use of aspirin: Secondary | ICD-10-CM | POA: Diagnosis not present

## 2016-10-20 DIAGNOSIS — N184 Chronic kidney disease, stage 4 (severe): Secondary | ICD-10-CM | POA: Diagnosis not present

## 2016-10-20 DIAGNOSIS — R262 Difficulty in walking, not elsewhere classified: Secondary | ICD-10-CM | POA: Diagnosis not present

## 2016-10-20 DIAGNOSIS — E162 Hypoglycemia, unspecified: Secondary | ICD-10-CM

## 2016-10-20 DIAGNOSIS — R55 Syncope and collapse: Secondary | ICD-10-CM | POA: Diagnosis not present

## 2016-10-20 DIAGNOSIS — I82502 Chronic embolism and thrombosis of unspecified deep veins of left lower extremity: Secondary | ICD-10-CM | POA: Diagnosis not present

## 2016-10-20 DIAGNOSIS — K31819 Angiodysplasia of stomach and duodenum without bleeding: Secondary | ICD-10-CM | POA: Diagnosis not present

## 2016-10-20 DIAGNOSIS — I2510 Atherosclerotic heart disease of native coronary artery without angina pectoris: Secondary | ICD-10-CM | POA: Diagnosis present

## 2016-10-20 DIAGNOSIS — E11649 Type 2 diabetes mellitus with hypoglycemia without coma: Secondary | ICD-10-CM | POA: Diagnosis present

## 2016-10-20 DIAGNOSIS — F329 Major depressive disorder, single episode, unspecified: Secondary | ICD-10-CM | POA: Diagnosis not present

## 2016-10-20 DIAGNOSIS — K219 Gastro-esophageal reflux disease without esophagitis: Secondary | ICD-10-CM | POA: Diagnosis not present

## 2016-10-20 DIAGNOSIS — Z8551 Personal history of malignant neoplasm of bladder: Secondary | ICD-10-CM | POA: Diagnosis not present

## 2016-10-20 DIAGNOSIS — E785 Hyperlipidemia, unspecified: Secondary | ICD-10-CM | POA: Diagnosis present

## 2016-10-20 DIAGNOSIS — I272 Pulmonary hypertension, unspecified: Secondary | ICD-10-CM | POA: Diagnosis not present

## 2016-10-20 DIAGNOSIS — M069 Rheumatoid arthritis, unspecified: Secondary | ICD-10-CM | POA: Diagnosis present

## 2016-10-20 DIAGNOSIS — Z9181 History of falling: Secondary | ICD-10-CM | POA: Diagnosis not present

## 2016-10-20 DIAGNOSIS — E1142 Type 2 diabetes mellitus with diabetic polyneuropathy: Secondary | ICD-10-CM | POA: Diagnosis not present

## 2016-10-20 DIAGNOSIS — F0390 Unspecified dementia without behavioral disturbance: Secondary | ICD-10-CM | POA: Diagnosis not present

## 2016-10-20 DIAGNOSIS — I110 Hypertensive heart disease with heart failure: Secondary | ICD-10-CM | POA: Diagnosis not present

## 2016-10-20 DIAGNOSIS — Z87891 Personal history of nicotine dependence: Secondary | ICD-10-CM | POA: Diagnosis not present

## 2016-10-20 DIAGNOSIS — E1122 Type 2 diabetes mellitus with diabetic chronic kidney disease: Secondary | ICD-10-CM | POA: Diagnosis present

## 2016-10-20 DIAGNOSIS — Z953 Presence of xenogenic heart valve: Secondary | ICD-10-CM | POA: Diagnosis not present

## 2016-10-20 DIAGNOSIS — T148XXA Other injury of unspecified body region, initial encounter: Secondary | ICD-10-CM | POA: Diagnosis not present

## 2016-10-20 LAB — BASIC METABOLIC PANEL
Anion gap: 9 (ref 5–15)
BUN: 52 mg/dL — ABNORMAL HIGH (ref 6–20)
CHLORIDE: 95 mmol/L — AB (ref 101–111)
CO2: 25 mmol/L (ref 22–32)
Calcium: 8.4 mg/dL — ABNORMAL LOW (ref 8.9–10.3)
Creatinine, Ser: 2.63 mg/dL — ABNORMAL HIGH (ref 0.61–1.24)
GFR, EST AFRICAN AMERICAN: 24 mL/min — AB (ref >60)
GFR, EST NON AFRICAN AMERICAN: 20 mL/min — AB (ref >60)
Glucose, Bld: 236 mg/dL — ABNORMAL HIGH (ref 65–99)
Potassium: 4.3 mmol/L (ref 3.5–5.1)
SODIUM: 129 mmol/L — AB (ref 135–145)

## 2016-10-20 LAB — CBC
HCT: 24.7 % — ABNORMAL LOW (ref 39.0–52.0)
HCT: 24.8 % — ABNORMAL LOW (ref 39.0–52.0)
HEMOGLOBIN: 8.2 g/dL — AB (ref 13.0–17.0)
Hemoglobin: 8.2 g/dL — ABNORMAL LOW (ref 13.0–17.0)
MCH: 28.6 pg (ref 26.0–34.0)
MCH: 28.4 pg (ref 26.0–34.0)
MCHC: 33.2 g/dL (ref 30.0–36.0)
MCHC: 33.1 g/dL (ref 30.0–36.0)
MCV: 86.1 fL (ref 78.0–100.0)
MCV: 85.8 fL (ref 78.0–100.0)
Platelets: 149 10*3/uL — ABNORMAL LOW (ref 150–400)
Platelets: 138 10*3/uL — ABNORMAL LOW (ref 150–400)
RBC: 2.87 MIL/uL — AB (ref 4.22–5.81)
RBC: 2.89 MIL/uL — ABNORMAL LOW (ref 4.22–5.81)
RDW: 15.7 % — ABNORMAL HIGH (ref 11.5–15.5)
RDW: 15.7 % — AB (ref 11.5–15.5)
WBC: 2.6 10*3/uL — AB (ref 4.0–10.5)
WBC: 3 10*3/uL — ABNORMAL LOW (ref 4.0–10.5)

## 2016-10-20 LAB — GLUCOSE, CAPILLARY
GLUCOSE-CAPILLARY: 148 mg/dL — AB (ref 65–99)
GLUCOSE-CAPILLARY: 155 mg/dL — AB (ref 65–99)
GLUCOSE-CAPILLARY: 160 mg/dL — AB (ref 65–99)
Glucose-Capillary: 108 mg/dL — ABNORMAL HIGH (ref 65–99)
Glucose-Capillary: 168 mg/dL — ABNORMAL HIGH (ref 65–99)
Glucose-Capillary: 206 mg/dL — ABNORMAL HIGH (ref 65–99)

## 2016-10-20 MED ORDER — INSULIN GLARGINE 100 UNIT/ML ~~LOC~~ SOLN
15.0000 [IU] | Freq: Every day | SUBCUTANEOUS | Status: DC
  Administered 2016-10-20 – 2016-10-22 (×3): 15 [IU] via SUBCUTANEOUS
  Filled 2016-10-20 (×3): qty 0.15

## 2016-10-20 MED ORDER — ZOLPIDEM TARTRATE 5 MG PO TABS: 5.0000 mg | ORAL_TABLET | Freq: Every evening | ORAL | Status: DC | PRN

## 2016-10-20 MED ORDER — HYDROXYCHLOROQUINE SULFATE 200 MG PO TABS
200.0000 mg | ORAL_TABLET | Freq: Every day | ORAL | Status: DC
  Administered 2016-10-20 – 2016-10-22 (×3): 200 mg via ORAL
  Filled 2016-10-20 (×3): qty 1

## 2016-10-20 MED ORDER — ADULT MULTIVITAMIN W/MINERALS CH
1.0000 | ORAL_TABLET | Freq: Every day | ORAL | Status: DC
  Administered 2016-10-20 – 2016-10-22 (×3): 1 via ORAL
  Filled 2016-10-20 (×3): qty 1

## 2016-10-20 MED ORDER — ISOSORBIDE MONONITRATE ER 30 MG PO TB24
30.0000 mg | ORAL_TABLET | Freq: Every day | ORAL | Status: DC
  Administered 2016-10-20 – 2016-10-22 (×3): 30 mg via ORAL
  Filled 2016-10-20 (×3): qty 1

## 2016-10-20 MED ORDER — FUROSEMIDE 10 MG/ML IJ SOLN
20.0000 mg | Freq: Once | INTRAMUSCULAR | Status: AC
Start: 2016-10-20 — End: 2016-10-20
  Administered 2016-10-20: 20 mg via INTRAVENOUS
  Filled 2016-10-20: qty 2

## 2016-10-20 MED ORDER — CARVEDILOL 6.25 MG PO TABS
6.2500 mg | ORAL_TABLET | Freq: Two times a day (BID) | ORAL | Status: DC
  Administered 2016-10-20 – 2016-10-22 (×5): 6.25 mg via ORAL
  Filled 2016-10-20 (×5): qty 1

## 2016-10-20 MED ORDER — VITAMIN B-1 100 MG PO TABS
100.0000 mg | ORAL_TABLET | Freq: Every day | ORAL | Status: DC
  Administered 2016-10-20 – 2016-10-22 (×3): 100 mg via ORAL
  Filled 2016-10-20 (×3): qty 1

## 2016-10-20 MED ORDER — FOLIC ACID 1 MG PO TABS
1.0000 mg | ORAL_TABLET | Freq: Every day | ORAL | Status: DC
  Administered 2016-10-20 – 2016-10-22 (×3): 1 mg via ORAL
  Filled 2016-10-20 (×3): qty 1

## 2016-10-20 MED ORDER — PANTOPRAZOLE SODIUM 40 MG PO TBEC
40.0000 mg | DELAYED_RELEASE_TABLET | Freq: Every day | ORAL | Status: DC
  Administered 2016-10-20 – 2016-10-22 (×3): 40 mg via ORAL
  Filled 2016-10-20 (×3): qty 1

## 2016-10-20 MED ORDER — FERROUS SULFATE 325 (65 FE) MG PO TABS
325.0000 mg | ORAL_TABLET | Freq: Two times a day (BID) | ORAL | Status: DC
  Administered 2016-10-20 – 2016-10-22 (×5): 325 mg via ORAL
  Filled 2016-10-20 (×5): qty 1

## 2016-10-20 MED ORDER — ONDANSETRON HCL 4 MG PO TABS: 4.0000 mg | ORAL_TABLET | Freq: Four times a day (QID) | ORAL | Status: DC | PRN

## 2016-10-20 MED ORDER — HYDROCODONE-ACETAMINOPHEN 5-325 MG PO TABS
1.0000 | ORAL_TABLET | Freq: Four times a day (QID) | ORAL | Status: DC | PRN
Start: 2016-10-20 — End: 2016-10-22
  Administered 2016-10-20 – 2016-10-22 (×5): 1 via ORAL
  Filled 2016-10-20 (×5): qty 1

## 2016-10-20 MED ORDER — ACETAMINOPHEN 325 MG PO TABS
650.0000 mg | ORAL_TABLET | Freq: Four times a day (QID) | ORAL | Status: DC | PRN
  Administered 2016-10-20: 650 mg via ORAL
  Filled 2016-10-20: qty 2

## 2016-10-20 MED ORDER — ESCITALOPRAM OXALATE 10 MG PO TABS
10.0000 mg | ORAL_TABLET | Freq: Every day | ORAL | Status: DC
  Administered 2016-10-20 – 2016-10-21 (×2): 10 mg via ORAL
  Filled 2016-10-20 (×2): qty 1

## 2016-10-20 MED ORDER — KETOTIFEN FUMARATE 0.025 % OP SOLN
2.0000 [drp] | Freq: Two times a day (BID) | OPHTHALMIC | Status: DC
  Administered 2016-10-20 – 2016-10-22 (×5): 2 [drp] via OPHTHALMIC
  Filled 2016-10-20: qty 5

## 2016-10-20 MED ORDER — TORSEMIDE 20 MG PO TABS
40.0000 mg | ORAL_TABLET | Freq: Two times a day (BID) | ORAL | Status: DC
  Administered 2016-10-20 – 2016-10-22 (×5): 40 mg via ORAL
  Filled 2016-10-20 (×5): qty 2

## 2016-10-20 MED ORDER — ACETAMINOPHEN 650 MG RE SUPP: 650.0000 mg | Freq: Four times a day (QID) | RECTAL | Status: DC | PRN

## 2016-10-20 MED ORDER — THIAMINE HCL 100 MG/ML IJ SOLN: 100.0000 mg | Freq: Every day | INTRAMUSCULAR | Status: DC

## 2016-10-20 MED ORDER — IPRATROPIUM-ALBUTEROL 0.5-2.5 (3) MG/3ML IN SOLN
3.0000 mL | RESPIRATORY_TRACT | Status: DC | PRN
  Administered 2016-10-20: 3 mL via RESPIRATORY_TRACT
  Filled 2016-10-20 (×2): qty 3

## 2016-10-20 MED ORDER — INSULIN ASPART 100 UNIT/ML ~~LOC~~ SOLN
0.0000 [IU] | SUBCUTANEOUS | Status: DC
  Administered 2016-10-20: 3 [IU] via SUBCUTANEOUS
  Administered 2016-10-20: 2 [IU] via SUBCUTANEOUS
  Administered 2016-10-20: 3 [IU] via SUBCUTANEOUS
  Administered 2016-10-20: 2 [IU] via SUBCUTANEOUS
  Administered 2016-10-21: 5 [IU] via SUBCUTANEOUS
  Administered 2016-10-21: 2 [IU] via SUBCUTANEOUS
  Administered 2016-10-21: 7 [IU] via SUBCUTANEOUS
  Administered 2016-10-21: 2 [IU] via SUBCUTANEOUS
  Administered 2016-10-21: 3 [IU] via SUBCUTANEOUS
  Administered 2016-10-22: 2 [IU] via SUBCUTANEOUS
  Administered 2016-10-22: 5 [IU] via SUBCUTANEOUS
  Administered 2016-10-22: 3 [IU] via SUBCUTANEOUS

## 2016-10-20 MED ORDER — CALCITRIOL 0.25 MCG PO CAPS
0.2500 ug | ORAL_CAPSULE | ORAL | Status: DC
  Administered 2016-10-21: 0.25 ug via ORAL
  Filled 2016-10-20: qty 1

## 2016-10-20 MED ORDER — LORATADINE 10 MG PO TABS
10.0000 mg | ORAL_TABLET | Freq: Every day | ORAL | Status: DC
  Administered 2016-10-20 – 2016-10-22 (×3): 10 mg via ORAL
  Filled 2016-10-20 (×3): qty 1

## 2016-10-20 MED ORDER — TAMSULOSIN HCL 0.4 MG PO CAPS
0.4000 mg | ORAL_CAPSULE | Freq: Every day | ORAL | Status: DC
  Administered 2016-10-20 – 2016-10-21 (×2): 0.4 mg via ORAL
  Filled 2016-10-20 (×2): qty 1

## 2016-10-20 MED ORDER — ONDANSETRON HCL 4 MG/2ML IJ SOLN: 4.0000 mg | Freq: Four times a day (QID) | INTRAMUSCULAR | Status: DC | PRN

## 2016-10-20 NOTE — ED Notes (Signed)
Blood consent signed at this time.

## 2016-10-20 NOTE — Progress Notes (Signed)
Paged MD if he can advance the patient diet from clear liquid. Awaiting for response.

## 2016-10-20 NOTE — Progress Notes (Addendum)
Patient transferred to [facility] [address] from ED. Patient alert and oriented. Admission assessment complete. 2nd unit of blood started. Patient refused SCD's. Nurse educated. Patient verbalized understanding.Will continue to monitor patient.

## 2016-10-20 NOTE — Progress Notes (Signed)
Please refer to H and P for details regarding assessment and plan. Updated son. hgb improving after transfusion. Reassess hgb levels next am. Suspect patient may require SNF placement. Family to discuss with pt.   Will reassess next am.  VSS Gen: pt in nad, alert and awake cv: no cyanosis Pulm: no increased wob, no wheezes  Krystalyn Kubota, Linward Foster
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
## 2016-10-21 ENCOUNTER — Inpatient Hospital Stay: Payer: No Typology Code available for payment source

## 2016-10-21 LAB — CBC
HCT: 23 % — ABNORMAL LOW (ref 39.0–52.0)
HCT: 26.6 % — ABNORMAL LOW (ref 39.0–52.0)
Hemoglobin: 7.7 g/dL — ABNORMAL LOW (ref 13.0–17.0)
Hemoglobin: 8.9 g/dL — ABNORMAL LOW (ref 13.0–17.0)
MCH: 29 pg (ref 26.0–34.0)
MCH: 28.7 pg (ref 26.0–34.0)
MCHC: 33.5 g/dL (ref 30.0–36.0)
MCHC: 33.5 g/dL (ref 30.0–36.0)
MCV: 86.6 fL (ref 78.0–100.0)
MCV: 85.8 fL (ref 78.0–100.0)
PLATELETS: 145 10*3/uL — AB (ref 150–400)
PLATELETS: 158 10*3/uL (ref 150–400)
RBC: 2.68 MIL/uL — ABNORMAL LOW (ref 4.22–5.81)
RBC: 3.07 MIL/uL — ABNORMAL LOW (ref 4.22–5.81)
RDW: 15.4 % (ref 11.5–15.5)
RDW: 15.8 % — AB (ref 11.5–15.5)
WBC: 2.9 10*3/uL — ABNORMAL LOW (ref 4.0–10.5)
WBC: 2.3 10*3/uL — ABNORMAL LOW (ref 4.0–10.5)

## 2016-10-21 LAB — GLUCOSE, CAPILLARY
GLUCOSE-CAPILLARY: 119 mg/dL — AB (ref 65–99)
GLUCOSE-CAPILLARY: 167 mg/dL — AB (ref 65–99)
GLUCOSE-CAPILLARY: 305 mg/dL — AB (ref 65–99)
GLUCOSE-CAPILLARY: 291 mg/dL — AB (ref 65–99)
Glucose-Capillary: 158 mg/dL — ABNORMAL HIGH (ref 65–99)
Glucose-Capillary: 167 mg/dL — ABNORMAL HIGH (ref 65–99)
Glucose-Capillary: 226 mg/dL — ABNORMAL HIGH (ref 65–99)

## 2016-10-21 LAB — BASIC METABOLIC PANEL
Anion gap: 8 (ref 5–15)
BUN: 51 mg/dL — AB (ref 6–20)
CALCIUM: 8.3 mg/dL — AB (ref 8.9–10.3)
CO2: 27 mmol/L (ref 22–32)
CREATININE: 2.74 mg/dL — AB (ref 0.61–1.24)
Chloride: 94 mmol/L — ABNORMAL LOW (ref 101–111)
GFR calc Af Amer: 22 mL/min — ABNORMAL LOW (ref >60)
GFR calc non Af Amer: 19 mL/min — ABNORMAL LOW (ref >60)
GLUCOSE: 167 mg/dL — AB (ref 65–99)
Potassium: 4.1 mmol/L (ref 3.5–5.1)
Sodium: 129 mmol/L — ABNORMAL LOW (ref 135–145)

## 2016-10-21 LAB — PREPARE RBC (CROSSMATCH)

## 2016-10-21 MED ORDER — SALINE SPRAY 0.65 % NA SOLN
1.0000 | NASAL | Status: DC | PRN
  Administered 2016-10-21: 1 via NASAL
  Filled 2016-10-21: qty 44

## 2016-10-21 MED ORDER — SODIUM CHLORIDE 0.9 % IV SOLN
Freq: Once | INTRAVENOUS | Status: AC
  Administered 2016-10-21: 02:00:00 via INTRAVENOUS

## 2016-10-21 NOTE — Care Management Note (Signed)
Case Management Note  Patient Details  [name] MRN: [id number] Date of Birth: 06/24/1927  Subjective/Objective:     Admitted with Acute blood loss anemia              Action/Plan: Patient lives at home alone, has a caregiver during the daytime along with [facility] services provided by [facility]. Long talk with patient about DCP with lots of emotional support, he has agreed to go to SNF for short term rehab and request [facility]  SNF; Soc Worker made aware.   Expected Discharge Date:    possibly 10/24/2016              Expected Discharge Plan:  [place]  In-House Referral:  Clinical Social Work  Discharge planning Services  CM Consult   Status of Service:  In process, will continue to follow  Sherrilyn Rist [phone] 10/21/2016, 12:14 PM

## 2016-10-21 NOTE — Consult Note (Signed)
[place] Nurse wound consult note Reason for Consult: head laceration Patient fell at home Wound type: trauma  Pressure Injury POA: No Measurement:two areas re-approximated with steri-strips. Unable to measure sites Wound bed: NA Drainage (amount, consistency, odor) old blood dried on the wounds Periwound: intact  Dressing procedure/placement/frequency: Leave steri-strips in place until they fall off, ok for staff to cut edges away as they lift but do not remove steri-strips allow them to come off on their own.  Discussed POC with patient and bedside nurse.  Re consult if needed, will not follow at this time. Thanks  Ellakate Gonsalves [employer], RN,CWOCN, CNS [phone])

## 2016-10-21 NOTE — Progress Notes (Signed)
[facility]  Patient Status: Active (receiving services up to time of hospitalization)  [facility] is providing the following services: RN, PT and OT  If patient discharges after hours, please call [phone].   [name] 10/21/2016, 9:42 AM

## 2016-10-21 NOTE — Progress Notes (Signed)
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 As well as history of hypertension, hyperlipidemia, CAD status post CABG in 5784, diastolic congestive heart failure with last EF of 55-60% in 2018, severe stenosis status post aVR, PAH, A. fib, DM type II, CVA with residual left-sided weakness. His presenting to the hospital after a fall.   Assessment & Plan:   Principal Problem:   Acute blood loss anemia -  Blood levels improved after blood transfusion - Continuing to monitor levels. No active bleeding  Active Problems:   Chronic atrial fibrillation (HCC) - Do not feel patient is a candidate for anticoagulation given multiple falls, continue rate control with     Frequent falls -  Obtaining physical therapy evaluation    Hypoglycemia - resolved    History of GI bleed - hgb levels stable. Per discussion with family patient has h/o avm's    CKD (chronic kidney disease), stage IV ([place]) - stable currently    Acute on chronic diastolic CHF (congestive heart failure) ([place]) - compensated. Continue current regimen.   DVT prophylaxis: SCD's Code Status: full Family Communication: d/c son Disposition Plan: currently undergoing dispo planning. Possible d/c to SNF. Obtaining PT evaluation    Consultants:   None   Procedures: None   Antimicrobials: none   Subjective: Pt has no new complaints. No acute issues overnight.  Objective: Vitals:   10/21/16 0426 10/21/16 0520 10/21/16 0522 10/21/16 0956  BP: 107/75   (!) 128/51  Pulse: 63   75  Resp: 18   20  Temp: 98.6 F (37 C)   98.2 F (36.8 C)  TempSrc: Oral   Oral  SpO2: 97%   96%  Weight:  109.8 kg (242 lb) 109.9 kg (242 lb 6 oz)   Height:        Intake/Output Summary (Last 24 hours) at 10/21/16 1457 Last data filed at 10/21/16  0900  Gross per 24 hour  Intake           1117.5 ml  Output             1500 ml  Net           -382.5 ml   Filed Weights   10/20/16 0249 10/21/16 0520 10/21/16 0522  Weight: 105.2 kg (232 lb) 109.8 kg (242 lb) 109.9 kg (242 lb 6 oz)    Examination:  General exam: Appears calm and comfortable  Respiratory system: Clear to auscultation. Respiratory effort normal. Cardiovascular system: S1 & S2 heard, no rubs Gastrointestinal system: Abdomen is nondistended, soft and nontender. Central nervous system: Alert and oriented. No focal neurological deficits. Extremities: Symmetric 5 x 5 power. Skin: Pt has laceration at head, warm and dry Psychiatry: Mood & affect appropriate.   Data Reviewed: I have personally reviewed following labs and imaging studies  CBC:  Recent Labs Lab 10/19/16 2230 10/20/16 0928 10/20/16 1034 10/21/16 0030 10/21/16 0633  WBC 4.8 2.6* 3.0* 2.9* 2.3*  NEUTROABS 3.5  --   --   --   --   HGB 7.3* 8.2* 8.2* 7.7* 8.9*  HCT 23.0* 24.7* 24.8* 23.0* 26.6*  MCV 86.5 86.1 85.8 85.8 86.6  PLT 169 149* 138* 158 696*   Basic Metabolic Panel:  Recent Labs Lab 10/19/16 2230 10/20/16 0928 10/21/16 0030  NA 132* 129* 129*  K 4.4 4.3 4.1  CL 97* 95* 94*  CO2 24 25 27   GLUCOSE 91 236* 167*  BUN 57* 52* 51*  CREATININE 2.81* 2.63* 2.74*  CALCIUM 8.7* 8.4* 8.3*   GFR: Estimated Creatinine Clearance: 23.6 mL/min (A) (by C-G formula based on SCr of 2.74 mg/dL (H)). Liver Function Tests:  Recent Labs Lab 10/19/16 2230  AST 29  ALT 17  ALKPHOS 94  BILITOT 0.9  PROT 6.1*  ALBUMIN 3.3*   No results for input(s): LIPASE, AMYLASE in the last 168 hours. No results for input(s): AMMONIA in the last 168 hours. Coagulation Profile: No results for input(s): INR, PROTIME in the last 168 hours. Cardiac Enzymes:  Recent Labs Lab 10/19/16 2230  CKTOTAL 147   BNP (last 3 results) No results for input(s): PROBNP in the last 8760 hours. HbA1C: No results for  input(s): HGBA1C in the last 72 hours. CBG:  Recent Labs Lab 10/20/16 2039 10/21/16 0035 10/21/16 0409 10/21/16 0759 10/21/16 1118  GLUCAP 206* 158* 119* 167* 305*   Lipid Profile: No results for input(s): CHOL, HDL, LDLCALC, TRIG, CHOLHDL, LDLDIRECT in the last 72 hours. Thyroid Function Tests: No results for input(s): TSH, T4TOTAL, FREET4, T3FREE, THYROIDAB in the last 72 hours. Anemia Panel: No results for input(s): VITAMINB12, FOLATE, FERRITIN, TIBC, IRON, RETICCTPCT in the last 72 hours. Sepsis Labs: No results for input(s): PROCALCITON, LATICACIDVEN in the last 168 hours.  No results found for this or any previous visit (from the past 240 hour(s)).       Radiology Studies: Ct Head Wo Contrast  Result Date: 10/20/2016 CLINICAL DATA:  Status post fall, with posterior head injury. Head laceration and neck pain. Initial encounter. EXAM: CT HEAD WITHOUT CONTRAST CT CERVICAL SPINE WITHOUT CONTRAST TECHNIQUE: Multidetector CT imaging of the head and cervical spine was performed following the standard protocol without intravenous contrast. Multiplanar CT image reconstructions of the cervical spine were also generated. COMPARISON:  CT of the head performed 06/01/2016, and CT of the cervical spine performed 05/29/2016 FINDINGS: CT HEAD FINDINGS Brain: No evidence of acute infarction, hemorrhage, hydrocephalus, extra-axial collection or mass lesion/mass effect. Prominence of the ventricles and sulci reflects moderately severe cortical volume loss. Cerebellar atrophy is noted. Chronic lacunar infarcts are seen at the corona radiata bilaterally. Scattered periventricular and subcortical white matter change likely reflects small vessel ischemic microangiopathy. Chronic infarcts are seen at the left frontal lobe and right posterior parietal lobe. The brainstem and fourth ventricle are within normal limits. The basal ganglia are unremarkable in appearance. No mass effect or midline shift is seen.  Vascular: No hyperdense vessel or unexpected calcification. Skull: There is no evidence of fracture; prominent ectopic rests are seen at the occiput. Sinuses/Orbits: The orbits are within normal limits. The paranasal sinuses and mastoid air cells are well-aerated. Other: Soft tissue swelling is noted overlying the frontal calvarium. CT CERVICAL SPINE FINDINGS Alignment: Normal. Skull base and vertebrae: No acute fracture. No primary bone lesion or focal pathologic process. Soft tissues and spinal canal: No prevertebral fluid or swelling. No visible canal hematoma. Disc levels: Multilevel disc space narrowing is noted along the lower cervical spine, with scattered anterior and posterior disc osteophyte complexes. Underlying facet disease is noted. Upper chest: Calcification is seen at the carotid bifurcations bilaterally. The visualized portions of the thyroid gland are unremarkable. Other: No additional soft tissue abnormalities are seen. IMPRESSION: 1. No evidence of traumatic intracranial injury or fracture. 2. No evidence of fracture or  subluxation along the cervical spine. 3. Soft tissue swelling overlying the frontal calvarium. 4. Moderately severe cortical volume loss and scattered small vessel ischemic microangiopathy. 5. Chronic infarcts at the left frontal lobe and right posterior parietal lobe. Chronic lacunar infarcts at the corona radiata bilaterally. 6. Mild degenerative change along the lower cervical spine. 7. Calcification at the carotid bifurcations bilaterally. Carotid ultrasound is recommended for further evaluation, when and as deemed clinically appropriate. Electronically Signed   By: Garald Balding M.D.   On: 10/20/2016 00:16   Ct Cervical Spine Wo Contrast  Result Date: 10/20/2016 CLINICAL DATA:  Status post fall, with posterior head injury. Head laceration and neck pain. Initial encounter. EXAM: CT HEAD WITHOUT CONTRAST CT CERVICAL SPINE WITHOUT CONTRAST TECHNIQUE: Multidetector CT  imaging of the head and cervical spine was performed following the standard protocol without intravenous contrast. Multiplanar CT image reconstructions of the cervical spine were also generated. COMPARISON:  CT of the head performed 06/01/2016, and CT of the cervical spine performed 05/29/2016 FINDINGS: CT HEAD FINDINGS Brain: No evidence of acute infarction, hemorrhage, hydrocephalus, extra-axial collection or mass lesion/mass effect. Prominence of the ventricles and sulci reflects moderately severe cortical volume loss. Cerebellar atrophy is noted. Chronic lacunar infarcts are seen at the corona radiata bilaterally. Scattered periventricular and subcortical white matter change likely reflects small vessel ischemic microangiopathy. Chronic infarcts are seen at the left frontal lobe and right posterior parietal lobe. The brainstem and fourth ventricle are within normal limits. The basal ganglia are unremarkable in appearance. No mass effect or midline shift is seen. Vascular: No hyperdense vessel or unexpected calcification. Skull: There is no evidence of fracture; prominent ectopic rests are seen at the occiput. Sinuses/Orbits: The orbits are within normal limits. The paranasal sinuses and mastoid air cells are well-aerated. Other: Soft tissue swelling is noted overlying the frontal calvarium. CT CERVICAL SPINE FINDINGS Alignment: Normal. Skull base and vertebrae: No acute fracture. No primary bone lesion or focal pathologic process. Soft tissues and spinal canal: No prevertebral fluid or swelling. No visible canal hematoma. Disc levels: Multilevel disc space narrowing is noted along the lower cervical spine, with scattered anterior and posterior disc osteophyte complexes. Underlying facet disease is noted. Upper chest: Calcification is seen at the carotid bifurcations bilaterally. The visualized portions of the thyroid gland are unremarkable. Other: No additional soft tissue abnormalities are seen. IMPRESSION: 1.  No evidence of traumatic intracranial injury or fracture. 2. No evidence of fracture or subluxation along the cervical spine. 3. Soft tissue swelling overlying the frontal calvarium. 4. Moderately severe cortical volume loss and scattered small vessel ischemic microangiopathy. 5. Chronic infarcts at the left frontal lobe and right posterior parietal lobe. Chronic lacunar infarcts at the corona radiata bilaterally. 6. Mild degenerative change along the lower cervical spine. 7. Calcification at the carotid bifurcations bilaterally. Carotid ultrasound is recommended for further evaluation, when and as deemed clinically appropriate. Electronically Signed   By: Garald Balding M.D.   On: 10/20/2016 00:16   Dg Chest Port 1 View  Result Date: 10/20/2016 CLINICAL DATA:  Acute onset of syncope.  Initial encounter. EXAM: PORTABLE CHEST 1 VIEW COMPARISON:  Chest radiograph performed 10/05/2016 FINDINGS: The lungs are well-aerated. Vascular congestion is noted. Bibasilar airspace opacities may reflect interstitial edema or possibly mild pneumonia. There is no evidence of pleural effusion or pneumothorax. The cardiomediastinal silhouette is mildly enlarged. The patient is status post median sternotomy. A valve replacement is noted. No acute osseous abnormalities are seen. IMPRESSION: Vascular congestion and  mild cardiomegaly. Bibasilar airspace opacities may reflect interstitial edema or possibly mild pneumonia. Electronically Signed   By: Garald Balding M.D.   On: 10/20/2016 01:36   Dg Knee Complete 4 Views Left  Result Date: 10/19/2016 CLINICAL DATA:  Status post fall, with left knee pain, swelling and contusion. Initial encounter. EXAM: LEFT KNEE - COMPLETE 4+ VIEW COMPARISON:  Left knee radiographs performed 06/01/2016 FINDINGS: There is no evidence of fracture or dislocation. Mild narrowing is noted at the patellofemoral compartment, with associated sclerosis and cortical irregularity. Trace knee joint fluid remains  within normal limits. Scattered vascular calcifications are seen. IMPRESSION: 1. No evidence of fracture or dislocation. 2. Minimal degenerative change at the patellofemoral compartment. 3. Scattered vascular calcifications seen. Electronically Signed   By: Garald Balding M.D.   On: 10/19/2016 23:56   Dg Knee Complete 4 Views Right  Result Date: 10/19/2016 CLINICAL DATA:  Status post fall, with right knee pain, swelling and contusion. Initial encounter. EXAM: RIGHT KNEE - COMPLETE 4+ VIEW COMPARISON:  Right knee radiographs performed 05/05/2016 FINDINGS: There is no evidence of fracture or dislocation. There is narrowing of the patellofemoral compartment. Mild marginal osteophyte formation is noted at the medial and lateral compartments. No significant joint effusion is seen. Scattered vascular calcifications are seen. IMPRESSION: 1. No evidence of fracture or dislocation. 2. Narrowing of the patellofemoral compartment. Mild tricompartmental osteoarthritis. 3. Scattered vascular calcifications. Electronically Signed   By: Garald Balding M.D.   On: 10/19/2016 23:58        Scheduled Meds: . calcitRIOL  0.25 mcg Oral Once per day on Mon Wed Fri  . carvedilol  6.25 mg Oral BID  . escitalopram  10 mg Oral QHS  . ferrous sulfate  325 mg Oral BID WC  . folic acid  1 mg Oral Daily  . hydroxychloroquine  200 mg Oral Daily  . insulin aspart  0-9 Units Subcutaneous Q4H  . insulin glargine  15 Units Subcutaneous Daily  . isosorbide mononitrate  30 mg Oral Daily  . ketotifen  2 drop Right Eye BID  . loratadine  10 mg Oral Daily  . multivitamin with minerals  1 tablet Oral Daily  . pantoprazole  40 mg Oral Daily  . tamsulosin  0.4 mg Oral QPC supper  . thiamine  100 mg Oral Daily  . torsemide  40 mg Oral BID   Continuous Infusions:   LOS: 1 day    Time spent: > 25    Velvet Bathe, MD [facility] Pager [phone]  If 7PM-7AM, please contact night-coverage [email or web address] Password  [facility] 10/21/2016, 2:57 PM

## 2016-10-21 NOTE — NC FL2 (Signed)
[place] MEDICAID FL2 LEVEL OF CARE SCREENING TOOL     IDENTIFICATION  Patient [name] Birthdate: 1927-07-18 Sex: male Admission Date (Current Location): 10/19/2016  [facility] and [place] Number:  Herbalist and Address:  [facility]. [facility], [place] [address] [place], [place] [address]      Provider Number: [id number]  Attending Physician Name and Address:  Velvet Bathe, MD  Relative Name and Phone Number:  Zebulon Gantt. [phone]    Current Level of Care: Hospital Recommended Level of Care: [place] Prior Approval Number:    Date Approved/Denied:   PASRR Number: [id number]  Discharge Plan: SNF    Current Diagnoses: Patient Active Problem List   Diagnosis Date Noted  . Acute blood loss anemia 10/20/2016  . Acute on chronic diastolic CHF (congestive heart failure) ([place]) 10/05/2016  . Acute posthemorrhagic anemia 10/05/2016  . Hyponatremia 08/29/2016  . CKD (chronic kidney disease), stage IV ([place]) 08/29/2016  . Elevated troponin 08/29/2016  . Demand ischemia ([place]) 08/29/2016  . Generalized weakness 08/29/2016  . Dementia 08/29/2016  . Moderate to severe pulmonary hypertension ([place]) 08/29/2016  . HTN (hypertension) 07/26/2016  . Pressure injury of skin 07/19/2016  . Difficulty in walking, not elsewhere classified   . Pressure ulcer 07/17/2016  . History of GI bleed 07/17/2016  . Peripheral edema   . Leukocytosis 06/19/2016  . Anemia 06/19/2016  . Hypoglycemia 06/19/2016  . Frequent falls 06/01/2016  . Fall 06/01/2016  . Muscle weakness (generalized)   . COPD (chronic obstructive pulmonary disease) ([place])   . Anemia of chronic disease   . Minimal cerumen bilateral ear canals 08/18/2015  . Insomnia secondary to anxiety 07/19/2015  . Chronic diastolic heart failure ([place]) 04/20/2015  . Macular degeneration, dry 01/20/2015  . Arthritis 01/20/2015  . 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 Diabetic polyneuropathy associated with type 2 diabetes mellitus ([place]) 08/05/2014  . CKD (chronic kidney disease) stage 3, GFR 30-59 ml/min 07/21/2014  . Hypertensive heart disease with chronic systolic congestive heart failure ([place]) 06/30/2014  . Cerebral infarction ([place]) 06/29/2014  . Anemia in chronic kidney disease 11/10/2013  . Chronic atrial fibrillation ([place]) 05/12/2013  . Mobitz type 1 second degree atrioventricular block 10/01/2012  . S/P aortic valve replacement with bioprosthetic valve 03/24/2012  . Nonrheumatic aortic valve stenosis   . Junctional bradycardia   . Hyperlipidemia   . Angiodysplasia of stomach   . Coronary artery disease involving native coronary artery of native heart without angina pectoris     Orientation RESPIRATION BLADDER Height & Weight     Self, Time, Situation, Place    Continent Weight: 242 lb 6 oz (109.9 kg) Height:  5\' 10"  (177.8 cm)  BEHAVIORAL SYMPTOMS/MOOD NEUROLOGICAL BOWEL NUTRITION STATUS      Continent Diet (heart healthy)  AMBULATORY STATUS COMMUNICATION OF NEEDS Skin   Limited Assist Verbally Normal                       Personal Care Assistance Level of Assistance  Bathing, Feeding, Dressing Bathing Assistance: Limited assistance Feeding assistance: Independent Dressing Assistance: Limited assistance     Functional Limitations Info  Sight, Hearing, Speech Sight Info: Adequate Hearing Info: Impaired Speech Info: Adequate    SPECIAL CARE FACTORS FREQUENCY  PT (By licensed PT), OT (By licensed OT)     PT Frequency: 5x wk OT Frequency: 5x wk  Contractures      Additional Factors Info  Code Status, Allergies Code Status Info: full code Allergies Info: ASA ASPIRIN, LOSARTAN, METOPROLOL, PENICILLINS           Current Medications (10/21/2016):  This is the current hospital active medication list Current Facility-Administered Medications  Medication Dose Route Frequency Provider Last  Rate Last Dose  . acetaminophen (TYLENOL) tablet 650 mg  650 mg Oral Q6H PRN Fuller Plan A, MD   650 mg at 10/20/16 0209   Or  . acetaminophen (TYLENOL) suppository 650 mg  650 mg Rectal Q6H PRN Fuller Plan A, MD      . calcitRIOL (ROCALTROL) capsule 0.25 mcg  0.25 mcg Oral Once per day on Mon Wed Fri Fuller Plan A, MD   0.25 mcg at 10/21/16 0836  . carvedilol (COREG) tablet 6.25 mg  6.25 mg Oral BID Fuller Plan A, MD   6.25 mg at 10/21/16 0837  . escitalopram (LEXAPRO) tablet 10 mg  10 mg Oral QHS Fuller Plan A, MD   10 mg at 10/20/16 2108  . ferrous sulfate tablet 325 mg  325 mg Oral BID WC Smith, Rondell A, MD   325 mg at 10/21/16 0836  . folic acid (FOLVITE) tablet 1 mg  1 mg Oral Daily Tamala Julian, Rondell A, MD   1 mg at 10/21/16 0837  . HYDROcodone-acetaminophen (NORCO/VICODIN) 5-325 MG per tablet 1 tablet  1 tablet Oral Q6H PRN Norval Morton, MD   1 tablet at 10/21/16 0537  . hydroxychloroquine (PLAQUENIL) tablet 200 mg  200 mg Oral Daily Fuller Plan A, MD   200 mg at 10/21/16 0837  . insulin aspart (novoLOG) injection 0-9 Units  0-9 Units Subcutaneous Q4H Fuller Plan A, MD   7 Units at 10/21/16 1302  . insulin glargine (LANTUS) injection 15 Units  15 Units Subcutaneous Daily Velvet Bathe, MD   15 Units at 10/21/16 [phone]  . ipratropium-albuterol (DUONEB) 0.5-2.5 (3) MG/3ML nebulizer solution 3 mL  3 mL Nebulization Q4H PRN Fuller Plan A, MD   3 mL at 10/20/16 0645  . isosorbide mononitrate (IMDUR) 24 hr tablet 30 mg  30 mg Oral Daily Tamala Julian, Rondell A, MD   30 mg at 10/21/16 0837  . ketotifen (ZADITOR) 0.025 % ophthalmic solution 2 drop  2 drop Right Eye BID Fuller Plan A, MD   2 drop at 10/21/16 [phone]  . loratadine (CLARITIN) tablet 10 mg  10 mg Oral Daily Fuller Plan A, MD   10 mg at 10/21/16 0837  . multivitamin with minerals tablet 1 tablet  1 tablet Oral Daily Fuller Plan A, MD   1 tablet at 10/21/16 [phone]  . ondansetron (ZOFRAN) tablet 4 mg  4 mg Oral Q6H  PRN Fuller Plan A, MD       Or  . ondansetron (ZOFRAN) injection 4 mg  4 mg Intravenous Q6H PRN Smith, Rondell A, MD      . pantoprazole (PROTONIX) EC tablet 40 mg  40 mg Oral Daily Tamala Julian, Rondell A, MD   40 mg at 10/21/16 0837  . tamsulosin (FLOMAX) capsule 0.4 mg  0.4 mg Oral QPC supper Fuller Plan A, MD   0.4 mg at 10/20/16 1704  . thiamine (VITAMIN B-1) tablet 100 mg  100 mg Oral Daily Tamala Julian, Rondell A, MD   100 mg at 10/21/16 0837  . torsemide (DEMADEX) tablet 40 mg  40 mg Oral BID Fuller Plan A, MD   40 mg at 10/21/16 0836  .  zolpidem (AMBIEN) tablet 5 mg  5 mg Oral QHS PRN Norval Morton, MD         Discharge Medications: Please see discharge summary for a list of discharge medications.  Relevant Imaging Results:  Relevant Lab Results:   Additional [place], LCSW

## 2016-10-21 NOTE — Progress Notes (Addendum)
Blood is transfusing without any issues. Patient reports that he feels good.VS stable.  Greg Eckrich, RN

## 2016-10-21 NOTE — Evaluation (Signed)
Physical Therapy Evaluation Patient Details [name] MRN: [id number] DOB: 02/12/1928 Today's Date: 10/21/2016   History of Present Illness  83 yo male with admission from fall with laceration of head, anemia and transfused inpt.  Has residual L side hemiparesis and R knee OA.  PMHx:  CHF, a-fib, GI bleed, bladder CA, CAD, CKD 4, HTN, CABG 2009  Clinical Impression  Pt is demonstrating some significant limitations of his strength and gait tolerance, all of which will make a stair environment a difficult situation.  His plan is to follow acutely and progress his standing and gait as able with there ex to increase stability, and will refer to SNF for final rehab prior to getting home since he must be independent at times to succeed there.  Noted O2 sats were 97% pre and post gait, pulses were stable at 63-69 at end of walk.    Follow Up Recommendations SNF    Equipment Recommendations  None recommended by PT    Recommendations for Other Services       Precautions / Restrictions Precautions Precautions: Fall (telemetry) Restrictions Weight Bearing Restrictions: No      Mobility  Bed Mobility               General bed mobility comments: up when PT entered  Transfers Overall transfer level: Needs assistance Equipment used: Rolling walker (2 wheeled);1 person hand held assist Transfers: Sit to/from [facility] Sit to Stand: Min assist Stand pivot transfers: Min assist       General transfer comment: reminders for hand placement  Ambulation/Gait Ambulation/Gait assistance: Min assist Ambulation Distance (Feet): 45 Feet Assistive device: Rolling walker (2 wheeled);1 person hand held assist Gait Pattern/deviations: Step-to pattern;Step-through pattern;Decreased stride length;Wide base of support;Trunk flexed Gait velocity: reduced Gait velocity interpretation: Below normal speed for age/gender General Gait Details: Pt is able to lift up walker  and reminded him to push it, unsteady without placing on the ground  Stairs            Wheelchair Mobility    Modified Rankin (Stroke Patients Only)       Balance Overall balance assessment: History of Falls                                           Pertinent Vitals/Pain Pain Assessment: Faces Faces Pain Scale: Hurts little more Pain Location: head    Home Living Family/patient expects to be discharged to:: Skilled nursing facility Living Arrangements: Alone               Additional Comments: Pt currently has caregivers 7 days/week for ADLs    Prior Function Level of Independence: Independent with assistive device(s)         Comments: RW for gait, has help for ADL's     Hand Dominance   Dominant Hand: Right    Extremity/Trunk Assessment   Upper Extremity Assessment Upper Extremity Assessment: Generalized weakness    Lower Extremity Assessment Lower Extremity Assessment: Generalized weakness    Cervical / Trunk Assessment Cervical / Trunk Assessment: Kyphotic  Communication   Communication: No difficulties  Cognition Arousal/Alertness: Awake/alert Behavior During Therapy: WFL for tasks assessed/performed Overall Cognitive Status: Within Functional Limits for tasks assessed  General Comments      Exercises     Assessment/Plan    PT Assessment Patient needs continued PT services  PT Problem List Decreased strength;Decreased range of motion;Decreased activity tolerance;Decreased balance;Decreased mobility;Decreased coordination;Decreased safety awareness;Cardiopulmonary status limiting activity;Obesity;Decreased skin integrity;Pain       PT Treatment Interventions DME instruction;Gait training;Stair training;Functional mobility training;Therapeutic activities;Therapeutic exercise;Balance training;Neuromuscular re-education;Patient/family education    PT Goals  (Current goals can be found in the Care Plan section)  Acute Rehab PT Goals Patient Stated Goal: to feel stronger and practice walking more PT Goal Formulation: With patient Time For Goal Achievement: 11/04/16 Potential to Achieve Goals: Good    Frequency Min 3X/week   Barriers to discharge Inaccessible home environment;Decreased caregiver support has times without caregivers    Co-evaluation               AM-PAC PT "6 Clicks" Daily Activity  Outcome Measure Difficulty turning over in bed (including adjusting bedclothes, sheets and blankets)?: Total Difficulty moving from lying on back to sitting on the side of the bed? : Total Difficulty sitting down on and standing up from a chair with arms (e.g., wheelchair, bedside commode, etc,.)?: Total Help needed moving to and from a bed to chair (including a wheelchair)?: A Little Help needed walking in hospital room?: A Little Help needed climbing 3-5 steps with a railing? : A Lot 6 Click Score: 11    End of Session Equipment Utilized During Treatment: Gait belt Activity Tolerance: Patient tolerated treatment well;Patient limited by fatigue Patient left: in chair;with call bell/phone within reach;with chair alarm set Nurse Communication: Mobility status PT Visit Diagnosis: Unsteadiness on feet (R26.81);History of falling (Z91.81);Ataxic gait (R26.0)    Time: 8546-2703 PT Time Calculation (min) (ACUTE ONLY): 20 min   Charges:   PT Evaluation $PT Eval Moderate Complexity: 1 Procedure     PT G Codes:   PT G-Codes **NOT FOR INPATIENT CLASS** Functional Assessment Tool Used: AM-PAC 6 Clicks Basic Mobility    Ramond Dial 10/21/2016, 11:16 AM   Mee Hives, PT MS Acute Rehab Dept. Number: [place]

## 2016-10-21 NOTE — Clinical Social Work Note (Signed)
Clinical Social Work Assessment  Patient Details  [name] MRN: [id number] Date of Birth: 08/03/1927  Date of referral:  10/21/16               Reason for consult:  Discharge Planning                Permission sought to share information with:  Family Supports Permission granted to share information::  Yes, Verbal Permission Granted  Name::     Zakariyya Helfman  Agency::     Relationship::  [phone]  Contact Information:  son/poa  Housing/Transportation Living arrangements for the past 2 months:  Roundup of Information:  Patient Patient Interpreter Needed:  None Criminal Activity/Legal Involvement Pertinent to Current Situation/Hospitalization:  No - Comment as needed Significant Relationships:  Adult Children, Neighbor Lives with:  Self Do you feel safe going back to the place where you live?  Yes Need for family participation in patient care:  Yes (Comment)  Care giving concerns:  Patient lives alone and has been in and out of the hospital for months. Patient has support of children   Social Worker assessment / plan:  Holiday representative met patient at bedside to offer support and discuss discharge needs. Patient stated he lives alone but has private duty coming in from 12-7 but is on his own after that. Patient stated that his children are supportive and would like him to move in with them but patient has decline. Patient stated he would really like to keep his independence and stay at home. Patient is agreeable to discharge to SNF as long as he can get a private room at [facility]. CSW to complete necessary paperwork and initiate SNF search on patient behalf. CSW to follow up with patient once bed offers are available. CSW remains available for support and to facilitate patient discharge once medically stable.   Employment status:  Retired Forensic scientist:  Medicare PT Recommendations:  [place] /  Referral to community resources:  [place]  Patient/Family's Response to care:  Patient verbalized appreciation and understanding for CSW role and involvement in care. Family would like patient to stay with them or go to a long term facility since patient has been in and out of the hospital since Thanksgiving 2017.   Patient/Family's Understanding of and Emotional Response to Diagnosis, Current Treatment, and Prognosis:  Patient and family with good understanding of current medical state and limitations around most recent hospitalization.   Emotional Assessment Appearance:  Appears stated age Attitude/Demeanor/Rapport:  Other Affect (typically observed):  Pleasant, Happy Orientation:  Oriented to  Time, Oriented to Place, Oriented to Situation, Oriented to Self Alcohol / Substance use:  Not Applicable Psych involvement (Current and /or in the community):     Discharge Needs  Concerns to be addressed:  No discharge needs identified Readmission within the last 30 days:  Yes Current discharge risk:  Lives alone Barriers to Discharge:  No Barriers Identified   Wende Neighbors, LCSW 10/21/2016, 1:05 PM

## 2016-10-22 ENCOUNTER — Encounter
Admission: RE | Admit: 2016-10-22 | Discharge: 2016-10-22 | Disposition: A | Discharge status: Home / Self Care | Payer: Medicare Other | Source: Ambulatory Visit | Attending: Internal Medicine | Admitting: Internal Medicine

## 2016-10-22 DIAGNOSIS — M0589 Other rheumatoid arthritis with rheumatoid factor of multiple sites: Secondary | ICD-10-CM | POA: Diagnosis not present

## 2016-10-22 DIAGNOSIS — I5032 Chronic diastolic (congestive) heart failure: Secondary | ICD-10-CM | POA: Diagnosis present

## 2016-10-22 DIAGNOSIS — I69334 Monoplegia of upper limb following cerebral infarction affecting left non-dominant side: Secondary | ICD-10-CM | POA: Diagnosis not present

## 2016-10-22 DIAGNOSIS — I482 Chronic atrial fibrillation: Secondary | ICD-10-CM | POA: Diagnosis not present

## 2016-10-22 DIAGNOSIS — D62 Acute posthemorrhagic anemia: Secondary | ICD-10-CM | POA: Diagnosis not present

## 2016-10-22 DIAGNOSIS — Z9181 History of falling: Secondary | ICD-10-CM | POA: Diagnosis not present

## 2016-10-22 DIAGNOSIS — I110 Hypertensive heart disease with heart failure: Secondary | ICD-10-CM | POA: Diagnosis not present

## 2016-10-22 DIAGNOSIS — N184 Chronic kidney disease, stage 4 (severe): Secondary | ICD-10-CM | POA: Diagnosis not present

## 2016-10-22 DIAGNOSIS — E871 Hypo-osmolality and hyponatremia: Secondary | ICD-10-CM | POA: Diagnosis not present

## 2016-10-22 DIAGNOSIS — N400 Enlarged prostate without lower urinary tract symptoms: Secondary | ICD-10-CM | POA: Diagnosis not present

## 2016-10-22 DIAGNOSIS — I82502 Chronic embolism and thrombosis of unspecified deep veins of left lower extremity: Secondary | ICD-10-CM | POA: Diagnosis not present

## 2016-10-22 DIAGNOSIS — D500 Iron deficiency anemia secondary to blood loss (chronic): Secondary | ICD-10-CM | POA: Diagnosis not present

## 2016-10-22 DIAGNOSIS — E1142 Type 2 diabetes mellitus with diabetic polyneuropathy: Secondary | ICD-10-CM | POA: Diagnosis not present

## 2016-10-22 DIAGNOSIS — N189 Chronic kidney disease, unspecified: Secondary | ICD-10-CM | POA: Diagnosis not present

## 2016-10-22 DIAGNOSIS — E1165 Type 2 diabetes mellitus with hyperglycemia: Secondary | ICD-10-CM | POA: Diagnosis not present

## 2016-10-22 DIAGNOSIS — R51 Headache: Secondary | ICD-10-CM | POA: Diagnosis not present

## 2016-10-22 DIAGNOSIS — M6281 Muscle weakness (generalized): Secondary | ICD-10-CM | POA: Diagnosis not present

## 2016-10-22 DIAGNOSIS — I2510 Atherosclerotic heart disease of native coronary artery without angina pectoris: Secondary | ICD-10-CM | POA: Diagnosis not present

## 2016-10-22 DIAGNOSIS — I272 Pulmonary hypertension, unspecified: Secondary | ICD-10-CM | POA: Diagnosis not present

## 2016-10-22 DIAGNOSIS — S0101XD Laceration without foreign body of scalp, subsequent encounter: Secondary | ICD-10-CM | POA: Diagnosis not present

## 2016-10-22 DIAGNOSIS — K219 Gastro-esophageal reflux disease without esophagitis: Secondary | ICD-10-CM | POA: Diagnosis not present

## 2016-10-22 DIAGNOSIS — H353190 Nonexudative age-related macular degeneration, unspecified eye, stage unspecified: Secondary | ICD-10-CM | POA: Diagnosis not present

## 2016-10-22 DIAGNOSIS — R350 Frequency of micturition: Secondary | ICD-10-CM | POA: Diagnosis not present

## 2016-10-22 DIAGNOSIS — I5033 Acute on chronic diastolic (congestive) heart failure: Secondary | ICD-10-CM | POA: Diagnosis not present

## 2016-10-22 DIAGNOSIS — I350 Nonrheumatic aortic (valve) stenosis: Secondary | ICD-10-CM | POA: Diagnosis not present

## 2016-10-22 DIAGNOSIS — F0390 Unspecified dementia without behavioral disturbance: Secondary | ICD-10-CM | POA: Diagnosis not present

## 2016-10-22 DIAGNOSIS — R600 Localized edema: Secondary | ICD-10-CM | POA: Diagnosis not present

## 2016-10-22 DIAGNOSIS — R262 Difficulty in walking, not elsewhere classified: Secondary | ICD-10-CM | POA: Diagnosis not present

## 2016-10-22 DIAGNOSIS — K31819 Angiodysplasia of stomach and duodenum without bleeding: Secondary | ICD-10-CM | POA: Diagnosis not present

## 2016-10-22 DIAGNOSIS — D631 Anemia in chronic kidney disease: Secondary | ICD-10-CM | POA: Diagnosis not present

## 2016-10-22 DIAGNOSIS — E785 Hyperlipidemia, unspecified: Secondary | ICD-10-CM | POA: Diagnosis not present

## 2016-10-22 DIAGNOSIS — I129 Hypertensive chronic kidney disease with stage 1 through stage 4 chronic kidney disease, or unspecified chronic kidney disease: Secondary | ICD-10-CM | POA: Diagnosis not present

## 2016-10-22 DIAGNOSIS — J449 Chronic obstructive pulmonary disease, unspecified: Secondary | ICD-10-CM | POA: Diagnosis not present

## 2016-10-22 DIAGNOSIS — Z794 Long term (current) use of insulin: Secondary | ICD-10-CM | POA: Diagnosis not present

## 2016-10-22 DIAGNOSIS — Z79899 Other long term (current) drug therapy: Secondary | ICD-10-CM | POA: Diagnosis not present

## 2016-10-22 DIAGNOSIS — E1122 Type 2 diabetes mellitus with diabetic chronic kidney disease: Secondary | ICD-10-CM | POA: Diagnosis not present

## 2016-10-22 DIAGNOSIS — I5022 Chronic systolic (congestive) heart failure: Secondary | ICD-10-CM | POA: Diagnosis not present

## 2016-10-22 DIAGNOSIS — F329 Major depressive disorder, single episode, unspecified: Secondary | ICD-10-CM | POA: Diagnosis not present

## 2016-10-22 DIAGNOSIS — D638 Anemia in other chronic diseases classified elsewhere: Secondary | ICD-10-CM | POA: Diagnosis not present

## 2016-10-22 DIAGNOSIS — L89159 Pressure ulcer of sacral region, unspecified stage: Secondary | ICD-10-CM | POA: Diagnosis not present

## 2016-10-22 LAB — TYPE AND SCREEN
ABO/RH(D): O POS
Antibody Screen: NEGATIVE
UNIT DIVISION: 0
UNIT DIVISION: 0
Unit division: 0

## 2016-10-22 LAB — GLUCOSE, CAPILLARY
GLUCOSE-CAPILLARY: 117 mg/dL — AB (ref 65–99)
GLUCOSE-CAPILLARY: 204 mg/dL — AB (ref 65–99)
GLUCOSE-CAPILLARY: 288 mg/dL — AB (ref 65–99)
GLUCOSE-CAPILLARY: 298 mg/dL — AB (ref 65–99)

## 2016-10-22 LAB — BPAM RBC
Blood Product Expiration Date: 201805312359
Blood Product Expiration Date: 201806072359
Blood Product Expiration Date: 201806082359
ISSUE DATE / TIME: 201805130300
ISSUE DATE / TIME: 201805140152
ISSUE DATE / TIME: 201805130025
UNIT TYPE AND RH: 5100
Unit Type and Rh: 5100
Unit Type and Rh: 5100

## 2016-10-22 NOTE — Clinical Social Work Note (Signed)
[facility] has only semi-private rooms. Patient requesting a private room. After much discussion, patient is agreeable to the semi-private room as long as he can get on a list to get a private room once one is available. CSW spoke with patient regarding future living plans: living with family, ALF. Patient stated that this would be a last resort. He currently lives alone with a caregiver that comes in for several hours every day.   [name], [place]

## 2016-10-22 NOTE — Discharge Summary (Signed)
Physician Discharge Summary  [name] Lumadue WUJ:[id number] DOB: 10-11-1927 DOA: 10/19/2016  PCP: [name], MD  Admit date: 10/19/2016 Discharge date: 10/22/2016  Time spent: > 35 minutes  Recommendations for Outpatient Follow-up:  1. Monitor hemoglobin levels within the next week 2. Monitor sodium and serum creatinine 3. If patient develops bloody BMs decided whether or not to continue aspirin 4. Monitor blood sugars   Discharge Diagnoses:  Principal Problem:   Acute blood loss anemia Active Problems:   Chronic atrial fibrillation (HCC)   Frequent falls   Hypoglycemia   History of GI bleed   CKD (chronic kidney disease), stage IV (HCC)   Acute on chronic diastolic CHF (congestive heart failure) ([place])   Discharge Condition: Stable  Diet recommendation: Heart healthy/diabetic diet  Filed Weights   10/21/16 0520 10/21/16 0522 10/22/16 0441  Weight: 109.8 kg (242 lb) 109.9 kg (242 lb 6 oz) 109.5 kg (241 lb 8 oz)    History of present illness:  83 y.o. male with medical history significant of HTN, HLD, CAD s/p CABG in 2009, dCHF (Last EF 55-60% in 08/2016), severe AS s/p AVR, PAH, A. fib, DM type 2, GI bleed, CVA with residual left-sided weakness, requiring frequent bld transfusions, bladder ca, and RA; who presented after being found down at his home.  Hospital Course:   Principal Problem:   Acute blood loss anemia - Blood levels improved after blood transfusion - Continuing to monitor levels. No active bleeding, will restart aspirin on d/c given cardiac history but monitor blood levels and if patient has GI bleed consider discontinuing.  Active Problems:   Chronic atrial fibrillation ([place]) - Do not feel patient is a candidate for anticoagulation given multiple falls, continue rate control with carvedilol    Frequent falls -  Obtaining physical therapy evaluation    Hyperglycemia - continue prior to admission medication regimen.    History of GI bleed -  hgb levels stable. Per discussion with family patient has h/o avm's    CKD (chronic kidney disease), stage IV ([place]) - stable currently    Acute on chronic diastolic CHF (congestive heart failure) ([place]) - compensated. Continue current regimen.   Procedures:  None  Consultations:  None  Discharge Exam: Vitals:   10/22/16 0441 10/22/16 1155  BP: (!) 141/57 (!) 130/45  Pulse: 68 66  Resp: 20 18  Temp: 97.5 F (36.4 C) 97.9 F (36.6 C)    General: Pt in nad, alert and awake Cardiovascular: rrr, no rubs Respiratory: no increased wob, no wheezes  Discharge Instructions   Discharge Instructions    Call MD for:  difficulty breathing, headache or visual disturbances    Complete by:  As directed    Call MD for:  extreme fatigue    Complete by:  As directed    Call MD for:  severe uncontrolled pain    Complete by:  As directed    Diet - low sodium heart healthy    Complete by:  As directed    Discharge instructions    Complete by:  As directed    Recommend reassessing CBC within the next week. Patient has history of AVMs but is on aspirin secondary to his cardiac issues. We'll continue aspirin on discharge but if patient develops bloody BMs please revisit need to discontinue aspirin.   Increase activity slowly    Complete by:  As directed      Current Discharge Medication List    CONTINUE these medications which have NOT  CHANGED   Details  acetaminophen (TYLENOL) 325 MG tablet Take 2 tablets (650 mg total) by mouth every 6 (six) hours as needed for mild pain (or Fever >/= 101).    aspirin EC 81 MG tablet Take 1 tablet (81 mg total) by mouth daily. Qty: 90 tablet, Refills: 3    azelastine (OPTIVAR) 0.05 % ophthalmic solution Place 1 drop into the right eye 2 (two) times daily.    calcitRIOL (ROCALTROL) 0.25 MCG capsule TAKE ONE CAPSULE THREE TIMES A WEEK (MONDAY, WEDNESDAY, AND FRIDAY) Qty: 15 capsule, Refills: 5    carvedilol (COREG) 6.25 MG tablet Take 1  tablet (6.25 mg total) by mouth 2 (two) times daily. Qty: 180 tablet, Refills: 0    cholecalciferol (VITAMIN D) 1000 units tablet Take 1,000 Units by mouth daily.    escitalopram (LEXAPRO) 10 MG tablet TAKE 1 TABLET EVERY DAY WITH DINNER Qty: 30 tablet, Refills: 3    ferrous sulfate 325 (65 FE) MG tablet Take 1 tablet (325 mg total) by mouth 2 (two) times daily with a meal. Qty: 60 tablet, Refills: 3    HYDROcodone-acetaminophen (NORCO/VICODIN) 5-325 MG tablet Take 1 tablet by mouth daily as needed for moderate pain. DO NOT EXCEED 4GM OF APAP IN 24 HOURS FROM ALL SOURCES Qty: 15 tablet, Refills: 0   Associated Diagnoses: Arthritis    hydroxychloroquine (PLAQUENIL) 200 MG tablet Take 200 mg by mouth daily.    insulin aspart (NOVOLOG) 100 UNIT/ML injection Inject 4 Units into the skin 3 (three) times daily before meals. Qty: 10 mL, Refills: 11    insulin glargine (LANTUS) 100 UNIT/ML injection Inject 0.35 mLs (35 Units total) into the skin daily.    isosorbide mononitrate (IMDUR) 30 MG 24 hr tablet Take 30 mg by mouth daily.    loratadine (CLARITIN) 10 MG tablet Take 10 mg by mouth daily.    Multiple Vitamin (MULTIVITAMIN WITH MINERALS) TABS tablet Take 1 tablet by mouth daily.    Multiple Vitamins-Minerals (PRESERVISION AREDS 2) CAPS Take 1 capsule by mouth 2 (two) times daily.    pantoprazole (PROTONIX) 40 MG tablet Take 1 tablet (40 mg total) by mouth daily. Qty: 90 tablet, Refills: 3    tamsulosin (FLOMAX) 0.4 MG CAPS capsule TAKE 1 CAPSULE BY MOUTH DAILY AFTER SUPPER Qty: 30 capsule, Refills: 5    torsemide (DEMADEX) 20 MG tablet Take 2 tablets (40 mg total) by mouth 2 (two) times daily. Qty: 60 tablet, Refills: 2    vitamin C (ASCORBIC ACID) 500 MG tablet Take 500 mg by mouth daily.    zolpidem (AMBIEN) 5 MG tablet Take 1 tablet (5 mg total) by mouth at bedtime as needed for sleep. Qty: 30 tablet, Refills: 5       Allergies  Allergen Reactions  . Asa [Aspirin]  Other (See Comments)    Reaction:  Caused stomach ulcer patient can take 81 mg.  . Losartan Other (See Comments)    Decreased pulse rate  . Metoprolol Other (See Comments)    Reaction:  Bradycardia   . Penicillins Swelling and Other (See Comments)    Has patient had a PCN reaction causing immediate rash, facial/tongue/throat swelling, SOB or lightheadedness with hypotension: No Has patient had a PCN reaction causing severe rash involving mucus membranes or skin necrosis: No Has patient had a PCN reaction that required hospitalization No Has patient had a PCN reaction occurring within the last 10 years: No If all of the above answers are "NO", then may proceed with  Cephalosporin use.   Contact information for after-discharge care    Destination    [facility] .   Specialty:  [place] information: [address] [place] [phone]               The results of significant diagnostics from this hospitalization (including imaging, microbiology, ancillary and laboratory) are listed below for reference.    Significant Diagnostic Studies: Dg Chest 2 View  Result Date: 10/05/2016 CLINICAL DATA:  Shortness of breath.  Lower extremity edema. EXAM: CHEST  2 VIEW COMPARISON:  08/24/2016 FINDINGS: Stable postsurgical changes from CABG. Cardiomediastinal silhouette is normal. Mediastinal contours appear intact. Calcific atherosclerotic disease and tortuosity of the aorta. There is no evidence of focal airspace consolidation, or pneumothorax. Small nodular density overlies the right lower thorax. Interstitial pulmonary edema. There is a small right and moderate left pleural effusion. Osseous structures are without acute abnormality. Soft tissues are grossly normal. IMPRESSION: Interstitial pulmonary edema with small right and moderate left pleural effusions. Subcentimeter nodular density overlies the right lower thorax. This may  represent overlapping shadows, however pulmonary nodule cannot be excluded. Enlarged heart. Calcific atherosclerotic disease and tortuosity of the aorta. Electronically Signed   By: Fidela Salisbury M.D.   On: 10/05/2016 16:24   Ct Head Wo Contrast  Result Date: 10/20/2016 CLINICAL DATA:  Status post fall, with posterior head injury. Head laceration and neck pain. Initial encounter. EXAM: CT HEAD WITHOUT CONTRAST CT CERVICAL SPINE WITHOUT CONTRAST TECHNIQUE: Multidetector CT imaging of the head and cervical spine was performed following the standard protocol without intravenous contrast. Multiplanar CT image reconstructions of the cervical spine were also generated. COMPARISON:  CT of the head performed 06/01/2016, and CT of the cervical spine performed 05/29/2016 FINDINGS: CT HEAD FINDINGS Brain: No evidence of acute infarction, hemorrhage, hydrocephalus, extra-axial collection or mass lesion/mass effect. Prominence of the ventricles and sulci reflects moderately severe cortical volume loss. Cerebellar atrophy is noted. Chronic lacunar infarcts are seen at the corona radiata bilaterally. Scattered periventricular and subcortical white matter change likely reflects small vessel ischemic microangiopathy. Chronic infarcts are seen at the left frontal lobe and right posterior parietal lobe. The brainstem and fourth ventricle are within normal limits. The basal ganglia are unremarkable in appearance. No mass effect or midline shift is seen. Vascular: No hyperdense vessel or unexpected calcification. Skull: There is no evidence of fracture; prominent ectopic rests are seen at the occiput. Sinuses/Orbits: The orbits are within normal limits. The paranasal sinuses and mastoid air cells are well-aerated. Other: Soft tissue swelling is noted overlying the frontal calvarium. CT CERVICAL SPINE FINDINGS Alignment: Normal. Skull base and vertebrae: No acute fracture. No primary bone lesion or focal pathologic process.  Soft tissues and spinal canal: No prevertebral fluid or swelling. No visible canal hematoma. Disc levels: Multilevel disc space narrowing is noted along the lower cervical spine, with scattered anterior and posterior disc osteophyte complexes. Underlying facet disease is noted. Upper chest: Calcification is seen at the carotid bifurcations bilaterally. The visualized portions of the thyroid gland are unremarkable. Other: No additional soft tissue abnormalities are seen. IMPRESSION: 1. No evidence of traumatic intracranial injury or fracture. 2. No evidence of fracture or subluxation along the cervical spine. 3. Soft tissue swelling overlying the frontal calvarium. 4. Moderately severe cortical volume loss and scattered small vessel ischemic microangiopathy. 5. Chronic infarcts at the left frontal lobe and right posterior parietal lobe. Chronic lacunar infarcts at the corona radiata bilaterally. 6.  Mild degenerative change along the lower cervical spine. 7. Calcification at the carotid bifurcations bilaterally. Carotid ultrasound is recommended for further evaluation, when and as deemed clinically appropriate. Electronically Signed   By: Garald Balding M.D.   On: 10/20/2016 00:16   Ct Cervical Spine Wo Contrast  Result Date: 10/20/2016 CLINICAL DATA:  Status post fall, with posterior head injury. Head laceration and neck pain. Initial encounter. EXAM: CT HEAD WITHOUT CONTRAST CT CERVICAL SPINE WITHOUT CONTRAST TECHNIQUE: Multidetector CT imaging of the head and cervical spine was performed following the standard protocol without intravenous contrast. Multiplanar CT image reconstructions of the cervical spine were also generated. COMPARISON:  CT of the head performed 06/01/2016, and CT of the cervical spine performed 05/29/2016 FINDINGS: CT HEAD FINDINGS Brain: No evidence of acute infarction, hemorrhage, hydrocephalus, extra-axial collection or mass lesion/mass effect. Prominence of the ventricles and sulci  reflects moderately severe cortical volume loss. Cerebellar atrophy is noted. Chronic lacunar infarcts are seen at the corona radiata bilaterally. Scattered periventricular and subcortical white matter change likely reflects small vessel ischemic microangiopathy. Chronic infarcts are seen at the left frontal lobe and right posterior parietal lobe. The brainstem and fourth ventricle are within normal limits. The basal ganglia are unremarkable in appearance. No mass effect or midline shift is seen. Vascular: No hyperdense vessel or unexpected calcification. Skull: There is no evidence of fracture; prominent ectopic rests are seen at the occiput. Sinuses/Orbits: The orbits are within normal limits. The paranasal sinuses and mastoid air cells are well-aerated. Other: Soft tissue swelling is noted overlying the frontal calvarium. CT CERVICAL SPINE FINDINGS Alignment: Normal. Skull base and vertebrae: No acute fracture. No primary bone lesion or focal pathologic process. Soft tissues and spinal canal: No prevertebral fluid or swelling. No visible canal hematoma. Disc levels: Multilevel disc space narrowing is noted along the lower cervical spine, with scattered anterior and posterior disc osteophyte complexes. Underlying facet disease is noted. Upper chest: Calcification is seen at the carotid bifurcations bilaterally. The visualized portions of the thyroid gland are unremarkable. Other: No additional soft tissue abnormalities are seen. IMPRESSION: 1. No evidence of traumatic intracranial injury or fracture. 2. No evidence of fracture or subluxation along the cervical spine. 3. Soft tissue swelling overlying the frontal calvarium. 4. Moderately severe cortical volume loss and scattered small vessel ischemic microangiopathy. 5. Chronic infarcts at the left frontal lobe and right posterior parietal lobe. Chronic lacunar infarcts at the corona radiata bilaterally. 6. Mild degenerative change along the lower cervical spine.  7. Calcification at the carotid bifurcations bilaterally. Carotid ultrasound is recommended for further evaluation, when and as deemed clinically appropriate. Electronically Signed   By: Garald Balding M.D.   On: 10/20/2016 00:16   Dg Chest Port 1 View  Result Date: 10/20/2016 CLINICAL DATA:  Acute onset of syncope.  Initial encounter. EXAM: PORTABLE CHEST 1 VIEW COMPARISON:  Chest radiograph performed 10/05/2016 FINDINGS: The lungs are well-aerated. Vascular congestion is noted. Bibasilar airspace opacities may reflect interstitial edema or possibly mild pneumonia. There is no evidence of pleural effusion or pneumothorax. The cardiomediastinal silhouette is mildly enlarged. The patient is status post median sternotomy. A valve replacement is noted. No acute osseous abnormalities are seen. IMPRESSION: Vascular congestion and mild cardiomegaly. Bibasilar airspace opacities may reflect interstitial edema or possibly mild pneumonia. Electronically Signed   By: Garald Balding M.D.   On: 10/20/2016 01:36   Dg Knee Complete 4 Views Left  Result Date: 10/19/2016 CLINICAL DATA:  Status post fall, with left  knee pain, swelling and contusion. Initial encounter. EXAM: LEFT KNEE - COMPLETE 4+ VIEW COMPARISON:  Left knee radiographs performed 06/01/2016 FINDINGS: There is no evidence of fracture or dislocation. Mild narrowing is noted at the patellofemoral compartment, with associated sclerosis and cortical irregularity. Trace knee joint fluid remains within normal limits. Scattered vascular calcifications are seen. IMPRESSION: 1. No evidence of fracture or dislocation. 2. Minimal degenerative change at the patellofemoral compartment. 3. Scattered vascular calcifications seen. Electronically Signed   By: Garald Balding M.D.   On: 10/19/2016 23:56   Dg Knee Complete 4 Views Right  Result Date: 10/19/2016 CLINICAL DATA:  Status post fall, with right knee pain, swelling and contusion. Initial encounter. EXAM: RIGHT KNEE  - COMPLETE 4+ VIEW COMPARISON:  Right knee radiographs performed 05/05/2016 FINDINGS: There is no evidence of fracture or dislocation. There is narrowing of the patellofemoral compartment. Mild marginal osteophyte formation is noted at the medial and lateral compartments. No significant joint effusion is seen. Scattered vascular calcifications are seen. IMPRESSION: 1. No evidence of fracture or dislocation. 2. Narrowing of the patellofemoral compartment. Mild tricompartmental osteoarthritis. 3. Scattered vascular calcifications. Electronically Signed   By: Garald Balding M.D.   On: 10/19/2016 23:58    Microbiology: No results found for this or any previous visit (from the past 240 hour(s)).   Labs: Basic Metabolic Panel:  Recent Labs Lab 10/19/16 2230 10/20/16 0928 10/21/16 0030  NA 132* 129* 129*  K 4.4 4.3 4.1  CL 97* 95* 94*  CO2 24 25 27   GLUCOSE 91 236* 167*  BUN 57* 52* 51*  CREATININE 2.81* 2.63* 2.74*  CALCIUM 8.7* 8.4* 8.3*   Liver Function Tests:  Recent Labs Lab 10/19/16 2230  AST 29  ALT 17  ALKPHOS 94  BILITOT 0.9  PROT 6.1*  ALBUMIN 3.3*   No results for input(s): LIPASE, AMYLASE in the last 168 hours. No results for input(s): AMMONIA in the last 168 hours. CBC:  Recent Labs Lab 10/19/16 2230 10/20/16 0928 10/20/16 1034 10/21/16 0030 10/21/16 0633  WBC 4.8 2.6* 3.0* 2.9* 2.3*  NEUTROABS 3.5  --   --   --   --   HGB 7.3* 8.2* 8.2* 7.7* 8.9*  HCT 23.0* 24.7* 24.8* 23.0* 26.6*  MCV 86.5 86.1 85.8 85.8 86.6  PLT 169 149* 138* 158 145*   Cardiac Enzymes:  Recent Labs Lab 10/19/16 2230  CKTOTAL 147   BNP: BNP (last 3 results)  Recent Labs  08/14/16 1437 08/24/16 1708 10/19/16 2230  BNP 251.0* 371.0* 296.3*    ProBNP (last 3 results) No results for input(s): PROBNP in the last 8760 hours.  CBG:  Recent Labs Lab 10/21/16 2020 10/22/16 0001 10/22/16 0411 10/22/16 0743 10/22/16 1149  GLUCAP 226* 167* 117* 204* 288*     Signed:  Velvet Bathe MD.  [facility] 10/22/2016, 1:58 PM

## 2016-10-22 NOTE — Clinical Social Work Placement (Signed)
   CLINICAL SOCIAL WORK PLACEMENT  NOTE  Date:  10/22/2016  Patient Details  [name] MRN: [id number] Date of Birth: 1927-11-13  Clinical Social Work is seeking post-discharge placement for this patient at the [place] level of care (*CSW will initial, date and re-position this form in  chart as items are completed):  Yes   Patient/family provided with [place] Work Department's list of facilities offering this level of care within the geographic area requested by the patient (or if unable, by the patient's family).  Yes   Patient/family informed of their freedom to choose among providers that offer the needed level of care, that participate in Medicare, Medicaid or managed care program needed by the patient, have an available bed and are willing to accept the patient.  Yes   Patient/family informed of 's ownership interest in [facility] and [facility], as well as of the fact that they are under no obligation to receive care at these facilities.  PASRR submitted to EDS on       PASRR number received on       Existing PASRR number confirmed on 10/21/16     FL2 transmitted to all facilities in geographic area requested by pt/family on       FL2 transmitted to all facilities within larger geographic area on       Patient informed that his/her managed care company has contracts with or will negotiate with certain facilities, including the following:        Yes   Patient/family informed of bed offers received.  Patient chooses bed at [facility]     Physician recommends and patient chooses bed at      Patient to be transferred to [facility] on 10/22/16.  Patient to be transferred to facility by [facility]     Patient family notified on 10/22/16 of transfer.  Name of family member notified:  [name]     PHYSICIAN Please prepare prescriptions     Additional Comment:     _______________________________________________ Candie Chroman, LCSW 10/22/2016, 2:39 PM

## 2016-10-22 NOTE — Clinical Social Work Note (Signed)
CSW facilitated patient discharge including contacting patient family and facility to confirm patient discharge plans. Clinical information faxed to facility and family agreeable with plan. CSW arranged ambulance transport via [facility] to [facility] at 3:00 pm. RN to call report prior to discharge [phone] Room 201A).  CSW will sign off for now as social work intervention is no longer needed. Please consult [place] again if new needs arise.  [place] Scrape, [place]

## 2016-10-22 NOTE — Evaluation (Signed)
Occupational Therapy Evaluation Patient Details [name] MRN: [id number] DOB: 1927-09-20 Today's Date: 10/22/2016    History of Present Illness 83 yo male with admission from fall with laceration of head, anemia and transfused inpt.  Has residual L side hemiparesis and R knee OA.  PMHx:  CHF, a-fib, GI bleed, bladder CA, CAD, CKD 4, HTN, CABG 2009   Clinical Impression   Pt admitted with the above diagnosis and has the deficits listed below. Pt would benefit from cont OT to increase independence with basic adls and adl tranfers so he can possibly return to independent living after SNF rehab.  Do have concerns about the number of falls this pt has taken in last months when it comes to returning to live alone.      Follow Up Recommendations  SNF;Supervision/Assistance - 24 hour    Equipment Recommendations  None recommended by OT    Recommendations for Other Services       Precautions / Restrictions Precautions Precautions: Fall Restrictions Weight Bearing Restrictions: No      Mobility Bed Mobility Overal bed mobility: Needs Assistance Bed Mobility: Supine to Sit     Supine to sit: Min assist     General bed mobility comments: Pt requires assist getting to full sitting position.  Transfers Overall transfer level: Needs assistance Equipment used: Rolling walker (2 wheeled);1 person hand held assist Transfers: Sit to/from [facility] Sit to Stand: Min assist Stand pivot transfers: Min assist       General transfer comment: reminders for hand placement    Balance Overall balance assessment: History of Falls                                         ADL either performed or assessed with clinical judgement   ADL Overall ADL's : Needs assistance/impaired Eating/Feeding: Set up;Sitting   Grooming: Oral care;Wash/dry face;Brushing hair;Set up;Sitting Grooming Details (indicate cue type and reason): Pt sat at EOB to  groom. Upper Body Bathing: Set up;Sitting   Lower Body Bathing: Moderate assistance;Sit to/from stand   Upper Body Dressing : Minimal assistance;Sitting   Lower Body Dressing: Maximal assistance;Sit to/from stand   Toilet Transfer: Minimal assistance;Ambulation;Grab bars;Comfort height toilet;RW   Toileting- Clothing Manipulation and Hygiene: Minimal assistance;Sit to/from stand;Cueing for safety       Functional mobility during ADLs: Minimal assistance;Rolling walker General ADL Comments: Pt has a lot of assist with adls.  Pt does well grooming and feeding with set up and can take care of basic bathroom tasks with min assist.     Vision Baseline Vision/History: No visual deficits Patient Visual Report: No change from baseline Vision Assessment?: No apparent visual deficits     Perception     Praxis      Pertinent Vitals/Pain Pain Assessment: Faces Faces Pain Scale: Hurts little more Pain Location: head Pain Descriptors / Indicators: Aching;Sore Pain Intervention(s): Limited activity within patient's tolerance;Monitored during session;Repositioned     Hand Dominance Right   Extremity/Trunk Assessment Upper Extremity Assessment Upper Extremity Assessment: RUE deficits/detail;LUE deficits/detail RUE Deficits / Details: Strength:  Shoulder 4/5, elbow, 4/5, wrist and hand 4-/5 LUE Deficits / Details: Strength shoulder:  3/5, bicep/tricep 4-/5, wrist and hand 3/5.  Pt limited by arthritis and old CVA  LUE Coordination: decreased fine motor   Lower Extremity Assessment Lower Extremity Assessment: Defer to PT evaluation   Cervical /  Trunk Assessment Cervical / Trunk Assessment: Kyphotic   Communication Communication Communication: No difficulties   Cognition Arousal/Alertness: Awake/alert Behavior During Therapy: WFL for tasks assessed/performed Overall Cognitive Status: Within Functional Limits for tasks assessed                                      General Comments  Pt with laceration to back of head and to R elbow.    Exercises     Shoulder Instructions      Home Living Family/patient expects to be discharged to:: Skilled nursing facility Living Arrangements: Alone                               Additional Comments: Pt currently has caregivers 7 days/week for ADLs      Prior Functioning/Environment Level of Independence: Needs assistance  Gait / Transfers Assistance Needed: RW/Rollator for AMB ADL's / Homemaking Assistance Needed: Pt has caregiver to assist with bathing and dressing.  Pt can toilet , groom and feed self [place] with equipment.   Comments: RW for gait, has help for ADL's        OT Problem List: Decreased strength;Decreased range of motion;Impaired balance (sitting and/or standing);Decreased coordination;Decreased knowledge of use of DME or AE;Decreased safety awareness;Decreased knowledge of precautions;Impaired UE functional use;Pain      OT Treatment/Interventions: Self-care/ADL training;DME and/or AE instruction;Therapeutic activities;Balance training    OT Goals(Current goals can be found in the care plan section) Acute Rehab OT Goals Patient Stated Goal: to feel stronger and practice walking more OT Goal Formulation: With patient Time For Goal Achievement: 11/05/16 Potential to Achieve Goals: Good ADL Goals Pt Will Perform Grooming: with set-up;standing Pt Will Transfer to Toilet: with supervision;ambulating;grab bars (comfort height commode) Pt Will Perform Toileting - Clothing Manipulation and hygiene: with supervision;sit to/from stand  OT Frequency: Min 2X/week   Barriers to D/C: Decreased caregiver support  Pt lives alone in independent living.       Co-evaluation              AM-PAC PT "6 Clicks" Daily Activity     Outcome Measure Help from another person eating meals?: A Little Help from another person taking care of personal grooming?: A Little Help from another  person toileting, which includes using toliet, bedpan, or urinal?: A Little Help from another person bathing (including washing, rinsing, drying)?: A Lot Help from another person to put on and taking off regular upper body clothing?: A Little Help from another person to put on and taking off regular lower body clothing?: A Lot 6 Click Score: 16   End of Session Equipment Utilized During Treatment: Rolling walker Nurse Communication: Mobility status;Other (comment) (pt eating on EOB with alarm set.)  Activity Tolerance: Patient tolerated treatment well Patient left: in bed;with call bell/phone within reach;with bed alarm set  OT Visit Diagnosis: Unsteadiness on feet (R26.81);Repeated falls (R29.6);History of falling (Z91.81);Hemiplegia and hemiparesis;Pain Hemiplegia - Right/Left: Left Hemiplegia - dominant/non-dominant: Non-Dominant Hemiplegia - caused by: Cerebral infarction Pain - part of body:  (head)                Time: 7026-3785 OT Time Calculation (min): 16 min Charges:  OT General Charges $OT Visit: 1 Procedure OT Evaluation $OT Eval Moderate Complexity: 1 Procedure G-Codes:     Jinger Neighbors, OTR/L [phone]  Glenford Peers 10/22/2016,  9:04 AM

## 2016-10-22 NOTE — Consult Note (Signed)
   [facility] Inpatient Consult   10/22/2016  Tacuma Graffam Hun 1927/10/22 [id number]   Patient with a HX with [facility] in the [place] and was seen by the [facility] Social Worker.  Patient assessed for multiple admissions in the past 6 months. Chart review reveals the patient is an 83 year old male with a HX of HF, s/p CABG, GI Bleed Type 2 DM, and frequent blood transfusions with a recent fall at home.  Patient is scheduled to discharge to [facility] for rehab.  Patient's discharge plan for return to community assessed and concerns for needs.  Met with the patient at the bedside, [facility], patient states he plans to use [facility] when he returns home.  Explained the [facility] social worker's role for transitioning back home.  Patient acceptable of the follow up.  He state, "I just don't want to mess up anything with [place]."  Explained the differences again.  No family at the bedside. Verbal consent obtained and patient's transport here to the facility. For questions, please contact:  Natividad Brood, RN BSN [facility] Liaison  [phone] business mobile phone Toll free office [phone]

## 2016-10-22 NOTE — Progress Notes (Signed)
Patient wants to sleep in chair tonight refusing to go to bed.Chair alarm set encouraged patient not to try to get up without assistance from staff.Patient verbalized understanding.Will continue to monitor.

## 2016-10-22 NOTE — Progress Notes (Signed)
Patient has order to discharge to SNF. Report given to [facility]. IV and telemetry removed. Patient stable.

## 2016-10-23 ENCOUNTER — Encounter: Payer: Self-pay | Admitting: Internal Medicine

## 2016-10-23 ENCOUNTER — Other Ambulatory Visit: Payer: Self-pay | Admitting: Internal Medicine

## 2016-10-23 ENCOUNTER — Telehealth: Payer: Self-pay | Admitting: *Deleted

## 2016-10-23 ENCOUNTER — Encounter: Payer: Self-pay | Admitting: *Deleted

## 2016-10-23 DIAGNOSIS — E1142 Type 2 diabetes mellitus with diabetic polyneuropathy: Secondary | ICD-10-CM

## 2016-10-23 LAB — GLUCOSE, CAPILLARY
GLUCOSE-CAPILLARY: 369 mg/dL — AB (ref 65–99)
GLUCOSE-CAPILLARY: 445 mg/dL — AB (ref 65–99)
GLUCOSE-CAPILLARY: 254 mg/dL — AB (ref 65–99)
Glucose-Capillary: 300 mg/dL — ABNORMAL HIGH (ref 65–99)
Glucose-Capillary: 231 mg/dL — ABNORMAL HIGH (ref 65–99)

## 2016-10-23 MED ORDER — INSULIN DETEMIR 100 UNIT/ML ~~LOC~~ SOLN: 35.0000 [IU] | Freq: Every day | SUBCUTANEOUS | 11 refills | Status: AC

## 2016-10-23 NOTE — Telephone Encounter (Signed)
Pt's caregiver stated that pt currently in [place]. Patient has elevated blood sugar this morning, the facility will only administer 4 units of insulin. The caregiver stated that pt uses a sliding scale for elevated blood sugars , however the facility do not have orders to do this.  Contact

## 2016-10-23 NOTE — Telephone Encounter (Signed)
Orders were faxed to [facility] this morning.

## 2016-10-23 NOTE — Telephone Encounter (Signed)
[name] from [facility] requested additional; information in reference to lantus  For the prior authorization Contact   [phone]

## 2016-10-23 NOTE — Progress Notes (Signed)
.  ev

## 2016-10-23 NOTE — Telephone Encounter (Signed)
Spoke with [facility] and informed them that Dr. Derrel Nip changed the pt's lantus to levemir which is covered by the pt's insurance.

## 2016-10-24 LAB — GLUCOSE, CAPILLARY
GLUCOSE-CAPILLARY: 276 mg/dL — AB (ref 65–99)
Glucose-Capillary: 106 mg/dL — ABNORMAL HIGH (ref 65–99)
Glucose-Capillary: 254 mg/dL — ABNORMAL HIGH (ref 65–99)
Glucose-Capillary: 234 mg/dL — ABNORMAL HIGH (ref 65–99)
Glucose-Capillary: 235 mg/dL — ABNORMAL HIGH (ref 65–99)

## 2016-10-25 ENCOUNTER — Other Ambulatory Visit
Admission: RE | Admit: 2016-10-25 | Discharge: 2016-10-25 | Disposition: A | Discharge status: Home / Self Care | Payer: Medicare Other | Source: Skilled Nursing Facility | Attending: Internal Medicine | Admitting: Internal Medicine

## 2016-10-25 DIAGNOSIS — I110 Hypertensive heart disease with heart failure: Secondary | ICD-10-CM | POA: Insufficient documentation

## 2016-10-25 LAB — GLUCOSE, CAPILLARY
GLUCOSE-CAPILLARY: 287 mg/dL — AB (ref 65–99)
Glucose-Capillary: 252 mg/dL — ABNORMAL HIGH (ref 65–99)
Glucose-Capillary: 494 mg/dL — ABNORMAL HIGH (ref 65–99)
Glucose-Capillary: 359 mg/dL — ABNORMAL HIGH (ref 65–99)

## 2016-10-25 LAB — CBC WITH DIFFERENTIAL/PLATELET
Basophils Absolute: 0.1 10*3/uL (ref 0–0.1)
Basophils Relative: 1 %
Eosinophils Absolute: 0.5 10*3/uL (ref 0–0.7)
Eosinophils Relative: 10 %
HEMATOCRIT: 28.4 % — AB (ref 40.0–52.0)
HEMOGLOBIN: 9.6 g/dL — AB (ref 13.0–18.0)
LYMPHS ABS: 1.2 10*3/uL (ref 1.0–3.6)
LYMPHS PCT: 26 %
MCH: 28.8 pg (ref 26.0–34.0)
MCHC: 34 g/dL (ref 32.0–36.0)
MCV: 84.7 fL (ref 80.0–100.0)
Monocytes Absolute: 1 10*3/uL (ref 0.2–1.0)
Monocytes Relative: 20 %
NEUTROS ABS: 2 10*3/uL (ref 1.4–6.5)
NEUTROS PCT: 43 %
PLATELETS: 214 10*3/uL (ref 150–440)
RBC: 3.35 MIL/uL — AB (ref 4.40–5.90)
RDW: 16.1 % — ABNORMAL HIGH (ref 11.5–14.5)
WBC: 4.7 10*3/uL (ref 3.8–10.6)
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
## 2016-10-28 ENCOUNTER — Non-Acute Institutional Stay (SKILLED_NURSING_FACILITY): Payer: Medicare Other | Admitting: Gerontology

## 2016-10-28 ENCOUNTER — Other Ambulatory Visit
Admission: RE | Admit: 2016-10-28 | Discharge: 2016-10-28 | Disposition: A | Discharge status: Home / Self Care | Payer: Medicare Other | Source: Ambulatory Visit | Attending: Internal Medicine | Admitting: Internal Medicine

## 2016-10-28 DIAGNOSIS — I5033 Acute on chronic diastolic (congestive) heart failure: Secondary | ICD-10-CM | POA: Diagnosis not present

## 2016-10-28 DIAGNOSIS — I5032 Chronic diastolic (congestive) heart failure: Secondary | ICD-10-CM | POA: Insufficient documentation

## 2016-10-28 LAB — CBC WITH DIFFERENTIAL/PLATELET
Basophils Absolute: 0.1 10*3/uL (ref 0–0.1)
Basophils Relative: 1 %
EOS PCT: 8 %
Eosinophils Absolute: 0.4 10*3/uL (ref 0–0.7)
HEMATOCRIT: 25.8 % — AB (ref 40.0–52.0)
HEMOGLOBIN: 8.7 g/dL — AB (ref 13.0–18.0)
LYMPHS ABS: 0.8 10*3/uL — AB (ref 1.0–3.6)
LYMPHS PCT: 17 %
MCH: 28.6 pg (ref 26.0–34.0)
MCHC: 33.8 g/dL (ref 32.0–36.0)
MCV: 84.6 fL (ref 80.0–100.0)
Monocytes Absolute: 0.8 10*3/uL (ref 0.2–1.0)
Monocytes Relative: 17 %
NEUTROS PCT: 57 %
Neutro Abs: 2.9 10*3/uL (ref 1.4–6.5)
Platelets: 200 10*3/uL (ref 150–440)
RBC: 3.04 MIL/uL — AB (ref 4.40–5.90)
RDW: 16.1 % — ABNORMAL HIGH (ref 11.5–14.5)
WBC: 5.1 10*3/uL (ref 3.8–10.6)

## 2016-10-28 LAB — GLUCOSE, CAPILLARY
GLUCOSE-CAPILLARY: 252 mg/dL — AB (ref 65–99)
GLUCOSE-CAPILLARY: 224 mg/dL — AB (ref 65–99)
GLUCOSE-CAPILLARY: 219 mg/dL — AB (ref 65–99)
GLUCOSE-CAPILLARY: 150 mg/dL — AB (ref 65–99)
GLUCOSE-CAPILLARY: 230 mg/dL — AB (ref 65–99)
Glucose-Capillary: 273 mg/dL — ABNORMAL HIGH (ref 65–99)
Glucose-Capillary: 222 mg/dL — ABNORMAL HIGH (ref 65–99)
Glucose-Capillary: 236 mg/dL — ABNORMAL HIGH (ref 65–99)
Glucose-Capillary: 236 mg/dL — ABNORMAL HIGH (ref 65–99)
Glucose-Capillary: 318 mg/dL — ABNORMAL HIGH (ref 65–99)
Glucose-Capillary: 225 mg/dL — ABNORMAL HIGH (ref 65–99)
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
## 2016-10-29 ENCOUNTER — Ambulatory Visit: Payer: Self-pay | Admitting: *Deleted

## 2016-10-29 ENCOUNTER — Other Ambulatory Visit
Admission: RE | Admit: 2016-10-29 | Discharge: 2016-10-29 | Disposition: A | Discharge status: Home / Self Care | Payer: Medicare Other | Source: Ambulatory Visit | Attending: Gerontology | Admitting: Gerontology

## 2016-10-29 ENCOUNTER — Other Ambulatory Visit: Payer: Self-pay | Admitting: *Deleted

## 2016-10-29 ENCOUNTER — Encounter: Payer: Self-pay | Admitting: Cardiovascular Disease

## 2016-10-29 ENCOUNTER — Ambulatory Visit: Payer: Medicare Other | Admitting: Cardiovascular Disease

## 2016-10-29 DIAGNOSIS — I5032 Chronic diastolic (congestive) heart failure: Secondary | ICD-10-CM | POA: Insufficient documentation

## 2016-10-29 LAB — COMPREHENSIVE METABOLIC PANEL
ALK PHOS: 108 U/L (ref 38–126)
ALT: 15 U/L — ABNORMAL LOW (ref 17–63)
ANION GAP: 11 (ref 5–15)
AST: 20 U/L (ref 15–41)
Albumin: 3.4 g/dL — ABNORMAL LOW (ref 3.5–5.0)
BUN: 70 mg/dL — ABNORMAL HIGH (ref 6–20)
CALCIUM: 8.5 mg/dL — AB (ref 8.9–10.3)
CHLORIDE: 91 mmol/L — AB (ref 101–111)
CO2: 25 mmol/L (ref 22–32)
Creatinine, Ser: 2.77 mg/dL — ABNORMAL HIGH (ref 0.61–1.24)
GFR calc non Af Amer: 19 mL/min — ABNORMAL LOW (ref >60)
GFR, EST AFRICAN AMERICAN: 22 mL/min — AB (ref >60)
Glucose, Bld: 189 mg/dL — ABNORMAL HIGH (ref 65–99)
POTASSIUM: 4.4 mmol/L (ref 3.5–5.1)
SODIUM: 127 mmol/L — AB (ref 135–145)
Total Bilirubin: 0.9 mg/dL (ref 0.3–1.2)
Total Protein: 6.7 g/dL (ref 6.5–8.1)

## 2016-10-29 LAB — GLUCOSE, CAPILLARY
GLUCOSE-CAPILLARY: 274 mg/dL — AB (ref 65–99)
Glucose-Capillary: 243 mg/dL — ABNORMAL HIGH (ref 65–99)
Glucose-Capillary: 236 mg/dL — ABNORMAL HIGH (ref 65–99)
Glucose-Capillary: 297 mg/dL — ABNORMAL HIGH (ref 65–99)

## 2016-10-29 NOTE — Telephone Encounter (Signed)
This encounter was created in error - please disregard.

## 2016-10-29 NOTE — Patient Outreach (Signed)
[facility]) Care Management  10/29/2016  [name] Bangs 03/25/1928 [id number]   Phone call to [name], the discharge planner at [facility].  Per [name] patient has declined Hospice services and would like to keep his existing in home assistance. Per [name], patient is making slow process, not able to ambulate. Patient recommended for 24 hour care. No discharge date has been set yet.. Care Plan meeting scheduled for 11/13/16 at 1:00pm.    Plan: SNF visited scheduled for 10/30/16.     [name] [facility] Care Management [phone]

## 2016-10-30 ENCOUNTER — Other Ambulatory Visit
Admission: RE | Admit: 2016-10-30 | Discharge: 2016-10-30 | Disposition: A | Discharge status: Home / Self Care | Payer: Medicare Other | Source: Ambulatory Visit | Attending: Internal Medicine | Admitting: Internal Medicine

## 2016-10-30 ENCOUNTER — Telehealth: Payer: Self-pay | Admitting: Cardiovascular Disease

## 2016-10-30 ENCOUNTER — Ambulatory Visit: Payer: Self-pay | Admitting: *Deleted

## 2016-10-30 DIAGNOSIS — I5032 Chronic diastolic (congestive) heart failure: Secondary | ICD-10-CM | POA: Insufficient documentation

## 2016-10-30 LAB — CBC WITH DIFFERENTIAL/PLATELET
Basophils Absolute: 0.1 10*3/uL (ref 0–0.1)
Basophils Relative: 1 %
EOS ABS: 0.4 10*3/uL (ref 0–0.7)
Eosinophils Relative: 6 %
HEMATOCRIT: 25.4 % — AB (ref 40.0–52.0)
HEMOGLOBIN: 8.6 g/dL — AB (ref 13.0–18.0)
LYMPHS ABS: 0.7 10*3/uL — AB (ref 1.0–3.6)
Lymphocytes Relative: 13 %
MCH: 28.7 pg (ref 26.0–34.0)
MCHC: 33.9 g/dL (ref 32.0–36.0)
MCV: 84.6 fL (ref 80.0–100.0)
MONO ABS: 0.9 10*3/uL (ref 0.2–1.0)
MONOS PCT: 16 %
Neutro Abs: 3.6 10*3/uL (ref 1.4–6.5)
Neutrophils Relative %: 64 %
PLATELETS: 210 10*3/uL (ref 150–440)
RBC: 3 MIL/uL — ABNORMAL LOW (ref 4.40–5.90)
RDW: 15.7 % — AB (ref 11.5–14.5)
WBC: 5.7 10*3/uL (ref 3.8–10.6)

## 2016-10-30 LAB — GLUCOSE, CAPILLARY
GLUCOSE-CAPILLARY: 187 mg/dL — AB (ref 65–99)
Glucose-Capillary: 247 mg/dL — ABNORMAL HIGH (ref 65–99)
Glucose-Capillary: 189 mg/dL — ABNORMAL HIGH (ref 65–99)
Glucose-Capillary: 158 mg/dL — ABNORMAL HIGH (ref 65–99)

## 2016-10-30 NOTE — Telephone Encounter (Signed)
Called [employer] Pt is resident on [address]. His nurse Benjamine Mola is at lunch, so I spoke w/ her supervisor. Pt is currently on torsemide 40 mg BID.   He received IM lasix 2 days ago w/ good results.  She states that Kelby Aline, NP has been monitoring pt and would appreciate Dr. Donivan Scull input.  Advised her that options include increasing torsemide to 60 mg BID, adding metolazone 2.5 mg x 3 days to his current dose of torsemide or giving IV or IM lasix.  Asked her to have [facility] call back to discuss options.  Per Dr. Rockey Situ, he will discuss w/ Larene Beach when she call back.

## 2016-10-30 NOTE — Telephone Encounter (Signed)
I have talked with her this afternoon thx

## 2016-10-30 NOTE — Telephone Encounter (Signed)
Benjie Karvonen called from EP stating that she spoke w/ Larene Beach and offered Dr. Donivan Scull recommendations. She reports that she was hesitant to make any changes, as pt's NA+ was low at 127. Advised her that Dr. Rockey Situ will call Larene Beach back when he is finished w/ clinic. Her direct # is [phone].

## 2016-10-30 NOTE — Telephone Encounter (Signed)
Spoke w/ Manuela Schwartz.  She reports that pt is at [facility]. Advised her that pt missed his appt yesterday -she states that the facility "messed this up". She states that EP staff tell her that pt gets his fluid pill in the am & pm. Advised her that pt is to take torsemide 40 mg BID, but she is unsure what they give him. Pt gets a small bottle of water w/ each meal and a tea, but his fluid intake has drastically reduced from what it was at home. Pt previously drank at least 2 beers per day at home.  She states that she is not sure if they gave him IV or IM lasix, but it seems to be the only thing that works to get pt's fluid off. She would like to know if she can come to p/u an rx for this or if we can send a fax w/ this order to [facility]. Advised her that I will make Dr. Rockey Situ aware of her concerns and call back w/ his recommendation.

## 2016-10-30 NOTE — Telephone Encounter (Signed)
[name] care giver calling stating pt is at [facility]  He is having some weight gain again it is about 251 and his  normal weight is about 239   Pt has only had one lasix iv treatment and that seemed to be the only way to help the swelling in the legs  Pt c/o swelling: STAT is pt has developed SOB within 24 hours  1. How long have you been experiencing swelling? Not sure, just found out today. Thinks it may be about a week   2. Where is the swelling located? Legs and stomach   3.  Are you currently taking a "fluid pill"?yes   4.  Are you currently SOB? Yes   5.  Have you traveled recently? No   Please advise they are not seeing any help with the pills

## 2016-10-31 LAB — GLUCOSE, CAPILLARY: GLUCOSE-CAPILLARY: 166 mg/dL — AB (ref 65–99)

## 2016-11-01 ENCOUNTER — Other Ambulatory Visit
Admission: RE | Admit: 2016-11-01 | Discharge: 2016-11-01 | Disposition: A | Discharge status: Home / Self Care | Payer: Medicare Other | Source: Skilled Nursing Facility | Attending: Internal Medicine | Admitting: Internal Medicine

## 2016-11-01 DIAGNOSIS — I5032 Chronic diastolic (congestive) heart failure: Secondary | ICD-10-CM | POA: Insufficient documentation

## 2016-11-01 LAB — CBC WITH DIFFERENTIAL/PLATELET
BASOS PCT: 1 %
Basophils Absolute: 0 10*3/uL (ref 0–0.1)
EOS ABS: 0.4 10*3/uL (ref 0–0.7)
EOS PCT: 7 %
HCT: 23.7 % — ABNORMAL LOW (ref 40.0–52.0)
HEMOGLOBIN: 8 g/dL — AB (ref 13.0–18.0)
Lymphocytes Relative: 11 %
Lymphs Abs: 0.7 10*3/uL — ABNORMAL LOW (ref 1.0–3.6)
MCH: 28.6 pg (ref 26.0–34.0)
MCHC: 33.5 g/dL (ref 32.0–36.0)
MCV: 85.2 fL (ref 80.0–100.0)
Monocytes Absolute: 0.9 10*3/uL (ref 0.2–1.0)
Monocytes Relative: 14 %
NEUTROS PCT: 67 %
Neutro Abs: 4.6 10*3/uL (ref 1.4–6.5)
PLATELETS: 227 10*3/uL (ref 150–440)
RBC: 2.79 MIL/uL — AB (ref 4.40–5.90)
RDW: 15.7 % — ABNORMAL HIGH (ref 11.5–14.5)
WBC: 6.7 10*3/uL (ref 3.8–10.6)

## 2016-11-01 LAB — GLUCOSE, CAPILLARY
GLUCOSE-CAPILLARY: 248 mg/dL — AB (ref 65–99)
GLUCOSE-CAPILLARY: 223 mg/dL — AB (ref 65–99)
GLUCOSE-CAPILLARY: 116 mg/dL — AB (ref 65–99)
Glucose-Capillary: 173 mg/dL — ABNORMAL HIGH (ref 65–99)
Glucose-Capillary: 281 mg/dL — ABNORMAL HIGH (ref 65–99)
Glucose-Capillary: 251 mg/dL — ABNORMAL HIGH (ref 65–99)
Glucose-Capillary: 171 mg/dL — ABNORMAL HIGH (ref 65–99)
Glucose-Capillary: 205 mg/dL — ABNORMAL HIGH (ref 65–99)

## 2016-11-02 ENCOUNTER — Other Ambulatory Visit
Admission: RE | Admit: 2016-11-02 | Discharge: 2016-11-02 | Disposition: A | Discharge status: Home / Self Care | Payer: Medicare Other | Source: Skilled Nursing Facility | Attending: Gerontology | Admitting: Gerontology

## 2016-11-02 DIAGNOSIS — I5032 Chronic diastolic (congestive) heart failure: Secondary | ICD-10-CM | POA: Insufficient documentation

## 2016-11-02 LAB — BASIC METABOLIC PANEL
Anion gap: 12 (ref 5–15)
BUN: 82 mg/dL — ABNORMAL HIGH (ref 6–20)
CHLORIDE: 92 mmol/L — AB (ref 101–111)
CO2: 26 mmol/L (ref 22–32)
CREATININE: 3.03 mg/dL — AB (ref 0.61–1.24)
Calcium: 9 mg/dL (ref 8.9–10.3)
GFR, EST AFRICAN AMERICAN: 20 mL/min — AB (ref >60)
GFR, EST NON AFRICAN AMERICAN: 17 mL/min — AB (ref >60)
Glucose, Bld: 207 mg/dL — ABNORMAL HIGH (ref 65–99)
POTASSIUM: 4.1 mmol/L (ref 3.5–5.1)
SODIUM: 130 mmol/L — AB (ref 135–145)

## 2016-11-02 LAB — GLUCOSE, CAPILLARY
GLUCOSE-CAPILLARY: 248 mg/dL — AB (ref 65–99)
GLUCOSE-CAPILLARY: 183 mg/dL — AB (ref 65–99)
Glucose-Capillary: 87 mg/dL (ref 65–99)

## 2016-11-04 ENCOUNTER — Other Ambulatory Visit
Admission: RE | Admit: 2016-11-04 | Discharge: 2016-11-04 | Disposition: A | Discharge status: Home / Self Care | Payer: Medicare Other | Source: Ambulatory Visit | Attending: Internal Medicine | Admitting: Internal Medicine

## 2016-11-04 DIAGNOSIS — I5032 Chronic diastolic (congestive) heart failure: Secondary | ICD-10-CM | POA: Insufficient documentation

## 2016-11-04 LAB — COMPREHENSIVE METABOLIC PANEL
ALBUMIN: 3.4 g/dL — AB (ref 3.5–5.0)
ALT: 13 U/L — ABNORMAL LOW (ref 17–63)
ANION GAP: 11 (ref 5–15)
AST: 22 U/L (ref 15–41)
Alkaline Phosphatase: 114 U/L (ref 38–126)
BILIRUBIN TOTAL: 0.8 mg/dL (ref 0.3–1.2)
BUN: 88 mg/dL — ABNORMAL HIGH (ref 6–20)
CHLORIDE: 90 mmol/L — AB (ref 101–111)
CO2: 29 mmol/L (ref 22–32)
Calcium: 8.9 mg/dL (ref 8.9–10.3)
Creatinine, Ser: 3.15 mg/dL — ABNORMAL HIGH (ref 0.61–1.24)
GFR calc Af Amer: 19 mL/min — ABNORMAL LOW (ref >60)
GFR calc non Af Amer: 16 mL/min — ABNORMAL LOW (ref >60)
GLUCOSE: 209 mg/dL — AB (ref 65–99)
POTASSIUM: 4.5 mmol/L (ref 3.5–5.1)
SODIUM: 130 mmol/L — AB (ref 135–145)
TOTAL PROTEIN: 6.7 g/dL (ref 6.5–8.1)

## 2016-11-04 LAB — GLUCOSE, CAPILLARY
Glucose-Capillary: 192 mg/dL — ABNORMAL HIGH (ref 65–99)
Glucose-Capillary: 251 mg/dL — ABNORMAL HIGH (ref 65–99)

## 2016-11-04 LAB — CBC WITH DIFFERENTIAL/PLATELET
Basophils Absolute: 0.1 10*3/uL (ref 0–0.1)
Basophils Relative: 1 %
Eosinophils Absolute: 0.5 10*3/uL (ref 0–0.7)
Eosinophils Relative: 7 %
HEMATOCRIT: 23.1 % — AB (ref 40.0–52.0)
Hemoglobin: 7.8 g/dL — ABNORMAL LOW (ref 13.0–18.0)
LYMPHS ABS: 0.7 10*3/uL — AB (ref 1.0–3.6)
LYMPHS PCT: 10 %
MCH: 28.3 pg (ref 26.0–34.0)
MCHC: 33.7 g/dL (ref 32.0–36.0)
MCV: 83.9 fL (ref 80.0–100.0)
MONO ABS: 0.9 10*3/uL (ref 0.2–1.0)
MONOS PCT: 13 %
NEUTROS ABS: 4.4 10*3/uL (ref 1.4–6.5)
Neutrophils Relative %: 69 %
Platelets: 245 10*3/uL (ref 150–440)
RBC: 2.76 MIL/uL — ABNORMAL LOW (ref 4.40–5.90)
RDW: 15.5 % — AB (ref 11.5–14.5)
WBC: 6.5 10*3/uL (ref 3.8–10.6)

## 2016-11-05 ENCOUNTER — Other Ambulatory Visit: Payer: Self-pay | Admitting: Hematology and Oncology

## 2016-11-05 ENCOUNTER — Inpatient Hospital Stay: Payer: No Typology Code available for payment source | Attending: Hematology and Oncology

## 2016-11-05 ENCOUNTER — Non-Acute Institutional Stay (SKILLED_NURSING_FACILITY): Payer: Medicare Other | Admitting: Gerontology

## 2016-11-05 ENCOUNTER — Inpatient Hospital Stay: Payer: No Typology Code available for payment source

## 2016-11-05 ENCOUNTER — Inpatient Hospital Stay (HOSPITAL_BASED_OUTPATIENT_CLINIC_OR_DEPARTMENT_OTHER): Payer: No Typology Code available for payment source | Admitting: Hematology and Oncology

## 2016-11-05 ENCOUNTER — Other Ambulatory Visit: Payer: Self-pay | Admitting: *Deleted

## 2016-11-05 VITALS — BP 113/53 | HR 64 | Temp 97.8°F | Resp 18

## 2016-11-05 DIAGNOSIS — D631 Anemia in chronic kidney disease: Secondary | ICD-10-CM

## 2016-11-05 DIAGNOSIS — L89159 Pressure ulcer of sacral region, unspecified stage: Secondary | ICD-10-CM | POA: Insufficient documentation

## 2016-11-05 DIAGNOSIS — N184 Chronic kidney disease, stage 4 (severe): Principal | ICD-10-CM

## 2016-11-05 DIAGNOSIS — I2510 Atherosclerotic heart disease of native coronary artery without angina pectoris: Secondary | ICD-10-CM

## 2016-11-05 DIAGNOSIS — Z9181 History of falling: Secondary | ICD-10-CM | POA: Insufficient documentation

## 2016-11-05 DIAGNOSIS — F0390 Unspecified dementia without behavioral disturbance: Secondary | ICD-10-CM | POA: Insufficient documentation

## 2016-11-05 DIAGNOSIS — Z8551 Personal history of malignant neoplasm of bladder: Secondary | ICD-10-CM | POA: Insufficient documentation

## 2016-11-05 DIAGNOSIS — E871 Hypo-osmolality and hyponatremia: Secondary | ICD-10-CM

## 2016-11-05 DIAGNOSIS — D500 Iron deficiency anemia secondary to blood loss (chronic): Secondary | ICD-10-CM

## 2016-11-05 DIAGNOSIS — I482 Chronic atrial fibrillation: Secondary | ICD-10-CM | POA: Insufficient documentation

## 2016-11-05 DIAGNOSIS — I5033 Acute on chronic diastolic (congestive) heart failure: Secondary | ICD-10-CM

## 2016-11-05 DIAGNOSIS — I5032 Chronic diastolic (congestive) heart failure: Secondary | ICD-10-CM | POA: Insufficient documentation

## 2016-11-05 DIAGNOSIS — I350 Nonrheumatic aortic (valve) stenosis: Secondary | ICD-10-CM

## 2016-11-05 DIAGNOSIS — Z79899 Other long term (current) drug therapy: Secondary | ICD-10-CM

## 2016-11-05 DIAGNOSIS — Z8042 Family history of malignant neoplasm of prostate: Secondary | ICD-10-CM

## 2016-11-05 DIAGNOSIS — I129 Hypertensive chronic kidney disease with stage 1 through stage 4 chronic kidney disease, or unspecified chronic kidney disease: Secondary | ICD-10-CM

## 2016-11-05 DIAGNOSIS — N182 Chronic kidney disease, stage 2 (mild): Principal | ICD-10-CM

## 2016-11-05 DIAGNOSIS — R5383 Other fatigue: Secondary | ICD-10-CM | POA: Insufficient documentation

## 2016-11-05 DIAGNOSIS — R001 Bradycardia, unspecified: Secondary | ICD-10-CM

## 2016-11-05 DIAGNOSIS — E1122 Type 2 diabetes mellitus with diabetic chronic kidney disease: Secondary | ICD-10-CM | POA: Diagnosis not present

## 2016-11-05 DIAGNOSIS — N189 Chronic kidney disease, unspecified: Secondary | ICD-10-CM

## 2016-11-05 DIAGNOSIS — Z8719 Personal history of other diseases of the digestive system: Secondary | ICD-10-CM

## 2016-11-05 DIAGNOSIS — D508 Other iron deficiency anemias: Secondary | ICD-10-CM

## 2016-11-05 DIAGNOSIS — D638 Anemia in other chronic diseases classified elsewhere: Secondary | ICD-10-CM

## 2016-11-05 DIAGNOSIS — Z794 Long term (current) use of insulin: Secondary | ICD-10-CM | POA: Insufficient documentation

## 2016-11-05 DIAGNOSIS — M1990 Unspecified osteoarthritis, unspecified site: Secondary | ICD-10-CM

## 2016-11-05 DIAGNOSIS — S0101XD Laceration without foreign body of scalp, subsequent encounter: Secondary | ICD-10-CM | POA: Diagnosis not present

## 2016-11-05 DIAGNOSIS — E785 Hyperlipidemia, unspecified: Secondary | ICD-10-CM

## 2016-11-05 DIAGNOSIS — N400 Enlarged prostate without lower urinary tract symptoms: Secondary | ICD-10-CM | POA: Diagnosis not present

## 2016-11-05 DIAGNOSIS — Z7982 Long term (current) use of aspirin: Secondary | ICD-10-CM

## 2016-11-05 DIAGNOSIS — Z87891 Personal history of nicotine dependence: Secondary | ICD-10-CM

## 2016-11-05 LAB — CBC WITH DIFFERENTIAL/PLATELET
Basophils Absolute: 0.1 10*3/uL (ref 0–0.1)
Basophils Relative: 1 %
Eosinophils Absolute: 0.5 10*3/uL (ref 0–0.7)
Eosinophils Relative: 8 %
HCT: 22 % — ABNORMAL LOW (ref 40.0–52.0)
Hemoglobin: 7.6 g/dL — ABNORMAL LOW (ref 13.0–18.0)
Lymphocytes Relative: 11 %
Lymphs Abs: 0.7 10*3/uL — ABNORMAL LOW (ref 1.0–3.6)
MCH: 28.9 pg (ref 26.0–34.0)
MCHC: 34.5 g/dL (ref 32.0–36.0)
MCV: 83.7 fL (ref 80.0–100.0)
Monocytes Absolute: 0.8 10*3/uL (ref 0.2–1.0)
Monocytes Relative: 13 %
Neutro Abs: 4.1 10*3/uL (ref 1.4–6.5)
Neutrophils Relative %: 67 %
Platelets: 227 10*3/uL (ref 150–440)
RBC: 2.63 MIL/uL — ABNORMAL LOW (ref 4.40–5.90)
RDW: 15.4 % — ABNORMAL HIGH (ref 11.5–14.5)
WBC: 6.1 10*3/uL (ref 3.8–10.6)

## 2016-11-05 LAB — GLUCOSE, CAPILLARY
GLUCOSE-CAPILLARY: 84 mg/dL (ref 65–99)
GLUCOSE-CAPILLARY: 206 mg/dL — AB (ref 65–99)
GLUCOSE-CAPILLARY: 273 mg/dL — AB (ref 65–99)
GLUCOSE-CAPILLARY: 210 mg/dL — AB (ref 65–99)
GLUCOSE-CAPILLARY: 216 mg/dL — AB (ref 65–99)
GLUCOSE-CAPILLARY: 183 mg/dL — AB (ref 65–99)
Glucose-Capillary: 219 mg/dL — ABNORMAL HIGH (ref 65–99)
Glucose-Capillary: 261 mg/dL — ABNORMAL HIGH (ref 65–99)
Glucose-Capillary: 273 mg/dL — ABNORMAL HIGH (ref 65–99)

## 2016-11-05 LAB — SAMPLE TO BLOOD BANK

## 2016-11-05 MED ORDER — EPOETIN ALFA 10000 UNIT/ML IJ SOLN
10000.0000 [IU] | Freq: Once | INTRAMUSCULAR | Status: AC
  Administered 2016-11-05: 10000 [IU] via SUBCUTANEOUS
  Filled 2016-11-05: qty 2
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
## 2016-11-05 NOTE — Progress Notes (Signed)
Patient not sleeping well. States he has bilateral lower extremity edema. States he has a pressure ulcer on his coccyx with pain level 8/10.

## 2016-11-06 ENCOUNTER — Other Ambulatory Visit: Payer: Self-pay | Admitting: *Deleted

## 2016-11-06 ENCOUNTER — Other Ambulatory Visit
Admission: RE | Admit: 2016-11-06 | Discharge: 2016-11-06 | Disposition: A | Discharge status: Home / Self Care | Payer: Medicare Other | Source: Ambulatory Visit | Attending: Gerontology | Admitting: Gerontology

## 2016-11-06 ENCOUNTER — Ambulatory Visit: Payer: Medicare Other | Admitting: Internal Medicine

## 2016-11-06 DIAGNOSIS — I5032 Chronic diastolic (congestive) heart failure: Secondary | ICD-10-CM | POA: Insufficient documentation

## 2016-11-06 LAB — COMPREHENSIVE METABOLIC PANEL
ALT: 13 U/L — ABNORMAL LOW (ref 17–63)
ANION GAP: 11 (ref 5–15)
AST: 20 U/L (ref 15–41)
Albumin: 3.4 g/dL — ABNORMAL LOW (ref 3.5–5.0)
Alkaline Phosphatase: 113 U/L (ref 38–126)
BUN: 95 mg/dL — ABNORMAL HIGH (ref 6–20)
CHLORIDE: 87 mmol/L — AB (ref 101–111)
CO2: 30 mmol/L (ref 22–32)
Calcium: 8.8 mg/dL — ABNORMAL LOW (ref 8.9–10.3)
Creatinine, Ser: 3.12 mg/dL — ABNORMAL HIGH (ref 0.61–1.24)
GFR calc non Af Amer: 17 mL/min — ABNORMAL LOW (ref >60)
GFR, EST AFRICAN AMERICAN: 19 mL/min — AB (ref >60)
Glucose, Bld: 265 mg/dL — ABNORMAL HIGH (ref 65–99)
POTASSIUM: 4.1 mmol/L (ref 3.5–5.1)
SODIUM: 128 mmol/L — AB (ref 135–145)
Total Bilirubin: 0.7 mg/dL (ref 0.3–1.2)
Total Protein: 6.9 g/dL (ref 6.5–8.1)

## 2016-11-06 LAB — CBC WITH DIFFERENTIAL/PLATELET
Basophils Absolute: 0.1 10*3/uL (ref 0–0.1)
Basophils Relative: 1 %
EOS ABS: 0.5 10*3/uL (ref 0–0.7)
EOS PCT: 8 %
HCT: 23.2 % — ABNORMAL LOW (ref 40.0–52.0)
Hemoglobin: 8.1 g/dL — ABNORMAL LOW (ref 13.0–18.0)
LYMPHS ABS: 0.7 10*3/uL — AB (ref 1.0–3.6)
Lymphocytes Relative: 11 %
MCH: 28.9 pg (ref 26.0–34.0)
MCHC: 34.6 g/dL (ref 32.0–36.0)
MCV: 83.4 fL (ref 80.0–100.0)
MONOS PCT: 12 %
Monocytes Absolute: 0.7 10*3/uL (ref 0.2–1.0)
Neutro Abs: 4 10*3/uL (ref 1.4–6.5)
Neutrophils Relative %: 68 %
PLATELETS: 239 10*3/uL (ref 150–440)
RBC: 2.79 MIL/uL — ABNORMAL LOW (ref 4.40–5.90)
RDW: 15.5 % — ABNORMAL HIGH (ref 11.5–14.5)
WBC: 5.9 10*3/uL (ref 3.8–10.6)

## 2016-11-06 LAB — GLUCOSE, CAPILLARY
GLUCOSE-CAPILLARY: 207 mg/dL — AB (ref 65–99)
GLUCOSE-CAPILLARY: 354 mg/dL — AB (ref 65–99)
GLUCOSE-CAPILLARY: 327 mg/dL — AB (ref 65–99)
Glucose-Capillary: 116 mg/dL — ABNORMAL HIGH (ref 65–99)
Glucose-Capillary: 289 mg/dL — ABNORMAL HIGH (ref 65–99)

## 2016-11-07 ENCOUNTER — Other Ambulatory Visit: Payer: Self-pay | Admitting: *Deleted

## 2016-11-07 ENCOUNTER — Other Ambulatory Visit: Payer: Self-pay | Admitting: Hematology and Oncology

## 2016-11-07 DIAGNOSIS — D649 Anemia, unspecified: Secondary | ICD-10-CM

## 2016-11-07 LAB — GLUCOSE, CAPILLARY: GLUCOSE-CAPILLARY: 243 mg/dL — AB (ref 65–99)

## 2016-11-07 LAB — PREPARE RBC (CROSSMATCH)

## 2016-11-07 NOTE — Patient Outreach (Signed)
[facility]) Care Management  [facility] Social Work  11/07/2016  [name] 05/19/1928 [id number]  Subjective:  Patient states that he wants to return to his own home following his rehab stay.  Per patient, he is not interested in long term care at this time and feels that he does not need Hospice care. Patient would like to keep his personal care aid-Susan " She takes good care of me" and states that he is willing but reluctant  to consider hiring an additional aid at night for safety precautions.   Patient reports refusing PT and OT today due to pain in his knee.  "If they can find a therapist who can do painless therapy I will do it". Patient has med-alert, however is thinking of upgrading to Life Scan that will detect a fall and call EMS immediately if there is no response by patient when called.  Objective:   Encounter Medications:  Outpatient Encounter Prescriptions as of 11/07/2016  Medication Sig Note  . acetaminophen (TYLENOL) 325 MG tablet Take 2 tablets (650 mg total) by mouth every 6 (six) hours as needed for mild pain (or Fever >/= 101).   [name] aspirin EC 81 MG tablet Take 1 tablet (81 mg total) by mouth daily.   [name] azelastine (OPTIVAR) 0.05 % ophthalmic solution Place 1 drop into the right eye 2 (two) times daily.   . calcitRIOL (ROCALTROL) 0.25 MCG capsule TAKE ONE CAPSULE THREE TIMES A WEEK (MONDAY, WEDNESDAY, AND FRIDAY)   . carvedilol (COREG) 6.25 MG tablet Take 1 tablet (6.25 mg total) by mouth 2 (two) times daily.   . cholecalciferol (VITAMIN D) 1000 units tablet Take 1,000 Units by mouth daily.   [name] escitalopram (LEXAPRO) 10 MG tablet TAKE 1 TABLET EVERY DAY WITH DINNER   . ferrous sulfate 325 (65 FE) MG tablet Take 1 tablet (325 mg total) by mouth 2 (two) times daily with a meal.   . HYDROcodone-acetaminophen (NORCO/VICODIN) 5-325 MG tablet Take 1 tablet by mouth daily as needed for moderate pain. DO NOT EXCEED 4GM OF APAP IN 24 HOURS FROM ALL SOURCES   .  hydroxychloroquine (PLAQUENIL) 200 MG tablet Take 200 mg by mouth daily.   . insulin aspart (NOVOLOG) 100 UNIT/ML injection Inject 4 Units into the skin 3 (three) times daily before meals. (Patient taking differently: Inject 0-15 Units into the skin 3 (three) times daily before meals. ) 09/23/2016: Takes according to sliding scale.  . insulin detemir (LEVEMIR) 100 UNIT/ML injection Inject 0.35 mLs (35 Units total) into the skin at bedtime. (Patient not taking: Reported on 11/05/2016)   . isosorbide mononitrate (IMDUR) 30 MG 24 hr tablet Take 30 mg by mouth daily.   [name] LANTUS 100 UNIT/ML injection    . loratadine (CLARITIN) 10 MG tablet Take 10 mg by mouth daily.   . Multiple Vitamin (MULTIVITAMIN WITH MINERALS) TABS tablet Take 1 tablet by mouth daily.   . Multiple Vitamins-Minerals (PRESERVISION AREDS 2) CAPS Take 1 capsule by mouth 2 (two) times daily.   . pantoprazole (PROTONIX) 40 MG tablet Take 1 tablet (40 mg total) by mouth daily.   [name] spironolactone (ALDACTONE) 25 MG tablet Take 25 mg by mouth daily.   . tamsulosin (FLOMAX) 0.4 MG CAPS capsule TAKE 1 CAPSULE BY MOUTH DAILY AFTER SUPPER   . torsemide (DEMADEX) 20 MG tablet Take 2 tablets (40 mg total) by mouth 2 (two) times daily.   . vitamin C (ASCORBIC ACID) 500 MG tablet Take 500 mg by  mouth daily.   [name] zolpidem (AMBIEN) 5 MG tablet Take 1 tablet (5 mg total) by mouth at bedtime as needed for sleep.    No facility-administered encounter medications on file as of 11/07/2016.     Functional Status:  In your present state of health, do you have any difficulty performing the following activities: 11/07/2016 10/20/2016  Hearing? Tempie Donning  Vision? N N  Difficulty concentrating or making decisions? N N  Walking or climbing stairs? Y Y  Dressing or bathing? Y Y  Doing errands, shopping? Y N  Preparing Food and eating ? Y -  Using the Toilet? Y -  In the past six months, have you accidently leaked urine? Y -  Do you have problems with loss of bowel  control? Y -  Managing your Medications? N -  Managing your Finances? Y -  Housekeeping or managing your Housekeeping? Y -  Some recent data might be hidden    Fall/Depression Screening:  PHQ 2/9 Scores 11/07/2016 09/05/2016 07/25/2016 04/17/2016 09/13/2015 09/01/2014 07/21/2014  PHQ - 2 Score 0 0 0 0 0 4 0  PHQ- 9 Score - - - - - 13 -    Assessment:  Patient  Visited at [facility].  Patient's aid Manuela Schwartz at bedside. [facility] care management services discussed and consent signed. Patient does not want Hospice, stating he is not ready for that yet.  He is adamant about returning home. Patient is clear regarding the safety concerns and is reluctantly willing to consider hiring night time aid. Patient's aid Manuela Schwartz agrees wiith this plan and also helped to support the need for around the clock care.  This social worker spoke with [name], discharge planner who confirms [place] for  Wednesday 11/13/16 at 1 pm to discuss recommendation for 24 hour care.    Plan: This social worker will follow up with patient and discharge planner following family care meeting This social worker will provide patient with list of agencies that provide night time care.

## 2016-11-08 ENCOUNTER — Encounter: Payer: Self-pay | Admitting: *Deleted

## 2016-11-08 ENCOUNTER — Inpatient Hospital Stay: Payer: No Typology Code available for payment source | Attending: Hematology and Oncology

## 2016-11-08 ENCOUNTER — Other Ambulatory Visit
Admission: RE | Admit: 2016-11-08 | Discharge: 2016-11-08 | Disposition: A | Discharge status: Home / Self Care | Payer: Medicare Other | Source: Ambulatory Visit | Attending: Gerontology | Admitting: Gerontology

## 2016-11-08 ENCOUNTER — Encounter
Admission: RE | Admit: 2016-11-08 | Discharge: 2016-11-08 | Disposition: A | Discharge status: Home / Self Care | Payer: Medicare Other | Source: Ambulatory Visit | Attending: Internal Medicine | Admitting: Internal Medicine

## 2016-11-08 ENCOUNTER — Encounter: Payer: Self-pay | Admitting: Cardiovascular Disease

## 2016-11-08 DIAGNOSIS — N184 Chronic kidney disease, stage 4 (severe): Secondary | ICD-10-CM | POA: Diagnosis not present

## 2016-11-08 DIAGNOSIS — I129 Hypertensive chronic kidney disease with stage 1 through stage 4 chronic kidney disease, or unspecified chronic kidney disease: Secondary | ICD-10-CM | POA: Diagnosis not present

## 2016-11-08 DIAGNOSIS — D631 Anemia in chronic kidney disease: Secondary | ICD-10-CM | POA: Insufficient documentation

## 2016-11-08 DIAGNOSIS — I5032 Chronic diastolic (congestive) heart failure: Secondary | ICD-10-CM | POA: Insufficient documentation

## 2016-11-08 DIAGNOSIS — D649 Anemia, unspecified: Secondary | ICD-10-CM

## 2016-11-08 DIAGNOSIS — Z79899 Other long term (current) drug therapy: Secondary | ICD-10-CM | POA: Insufficient documentation

## 2016-11-08 LAB — CBC WITH DIFFERENTIAL/PLATELET
BASOS PCT: 2 %
Basophils Absolute: 0.1 10*3/uL (ref 0–0.1)
Eosinophils Absolute: 0.5 10*3/uL (ref 0–0.7)
Eosinophils Relative: 8 %
HEMATOCRIT: 23 % — AB (ref 40.0–52.0)
HEMOGLOBIN: 7.8 g/dL — AB (ref 13.0–18.0)
Lymphocytes Relative: 9 %
Lymphs Abs: 0.5 10*3/uL — ABNORMAL LOW (ref 1.0–3.6)
MCH: 28.5 pg (ref 26.0–34.0)
MCHC: 33.7 g/dL (ref 32.0–36.0)
MCV: 84.6 fL (ref 80.0–100.0)
MONO ABS: 0.8 10*3/uL (ref 0.2–1.0)
Monocytes Relative: 14 %
NEUTROS ABS: 4 10*3/uL (ref 1.4–6.5)
NEUTROS PCT: 67 %
Platelets: 244 10*3/uL (ref 150–440)
RBC: 2.72 MIL/uL — ABNORMAL LOW (ref 4.40–5.90)
RDW: 15.4 % — AB (ref 11.5–14.5)
WBC: 5.9 10*3/uL (ref 3.8–10.6)

## 2016-11-08 LAB — COMPREHENSIVE METABOLIC PANEL
ALT: 12 U/L — ABNORMAL LOW (ref 17–63)
ANION GAP: 13 (ref 5–15)
AST: 21 U/L (ref 15–41)
Albumin: 3.4 g/dL — ABNORMAL LOW (ref 3.5–5.0)
Alkaline Phosphatase: 108 U/L (ref 38–126)
BILIRUBIN TOTAL: 0.7 mg/dL (ref 0.3–1.2)
BUN: 96 mg/dL — ABNORMAL HIGH (ref 6–20)
CALCIUM: 8.9 mg/dL (ref 8.9–10.3)
CO2: 29 mmol/L (ref 22–32)
CREATININE: 3.26 mg/dL — AB (ref 0.61–1.24)
Chloride: 87 mmol/L — ABNORMAL LOW (ref 101–111)
GFR, EST AFRICAN AMERICAN: 18 mL/min — AB (ref >60)
GFR, EST NON AFRICAN AMERICAN: 16 mL/min — AB (ref >60)
Glucose, Bld: 88 mg/dL (ref 65–99)
Potassium: 3.8 mmol/L (ref 3.5–5.1)
SODIUM: 129 mmol/L — AB (ref 135–145)
Total Protein: 6.9 g/dL (ref 6.5–8.1)

## 2016-11-08 LAB — GLUCOSE, CAPILLARY
Glucose-Capillary: 136 mg/dL — ABNORMAL HIGH (ref 65–99)
Glucose-Capillary: 256 mg/dL — ABNORMAL HIGH (ref 65–99)

## 2016-11-08 MED ORDER — ACETAMINOPHEN 325 MG PO TABS
650.0000 mg | ORAL_TABLET | Freq: Once | ORAL | Status: AC
Start: 2016-11-08 — End: 2016-11-08
  Administered 2016-11-08: 650 mg via ORAL
  Filled 2016-11-08: qty 2

## 2016-11-08 MED ORDER — DIPHENHYDRAMINE HCL 25 MG PO CAPS
25.0000 mg | ORAL_CAPSULE | Freq: Once | ORAL | Status: AC
  Administered 2016-11-08: 25 mg via ORAL
  Filled 2016-11-08: qty 1

## 2016-11-08 MED ORDER — SODIUM CHLORIDE 0.9 % IV SOLN
250.0000 mL | Freq: Once | INTRAVENOUS | Status: AC
  Administered 2016-11-08: 250 mL via INTRAVENOUS
  Filled 2016-11-08: qty 250

## 2016-11-08 NOTE — Telephone Encounter (Signed)
This encounter was created in error - please disregard.

## 2016-11-09 LAB — TYPE AND SCREEN
ABO/RH(D): O POS
Antibody Screen: NEGATIVE
Unit division: 0
Unit division: 0

## 2016-11-09 LAB — BPAM RBC
Blood Product Expiration Date: 201806052359
Blood Product Expiration Date: 201806202359
ISSUE DATE / TIME: 201806011005
Unit Type and Rh: 5100
Unit Type and Rh: 5100

## 2016-11-12 ENCOUNTER — Non-Acute Institutional Stay (SKILLED_NURSING_FACILITY): Payer: Medicare Other | Admitting: Gerontology

## 2016-11-12 DIAGNOSIS — I5033 Acute on chronic diastolic (congestive) heart failure: Secondary | ICD-10-CM | POA: Diagnosis not present

## 2016-11-12 DIAGNOSIS — R600 Localized edema: Secondary | ICD-10-CM

## 2016-11-12 LAB — COMPREHENSIVE METABOLIC PANEL
ALT: 13 U/L — AB (ref 17–63)
AST: 22 U/L (ref 15–41)
Albumin: 3.4 g/dL — ABNORMAL LOW (ref 3.5–5.0)
Alkaline Phosphatase: 113 U/L (ref 38–126)
Anion gap: 12 (ref 5–15)
BUN: 105 mg/dL — AB (ref 6–20)
CHLORIDE: 91 mmol/L — AB (ref 101–111)
CO2: 28 mmol/L (ref 22–32)
CREATININE: 3.07 mg/dL — AB (ref 0.61–1.24)
Calcium: 8.9 mg/dL (ref 8.9–10.3)
GFR calc Af Amer: 20 mL/min — ABNORMAL LOW (ref >60)
GFR calc non Af Amer: 17 mL/min — ABNORMAL LOW (ref >60)
GLUCOSE: 91 mg/dL (ref 65–99)
Potassium: 4.2 mmol/L (ref 3.5–5.1)
SODIUM: 131 mmol/L — AB (ref 135–145)
Total Bilirubin: 0.9 mg/dL (ref 0.3–1.2)
Total Protein: 6.6 g/dL (ref 6.5–8.1)

## 2016-11-12 LAB — CBC WITH DIFFERENTIAL/PLATELET
BASOS ABS: 0.1 10*3/uL (ref 0–0.1)
Basophils Relative: 1 %
EOS ABS: 0.6 10*3/uL (ref 0–0.7)
EOS PCT: 12 %
HCT: 23.3 % — ABNORMAL LOW (ref 40.0–52.0)
HEMOGLOBIN: 7.9 g/dL — AB (ref 13.0–18.0)
LYMPHS PCT: 12 %
Lymphs Abs: 0.7 10*3/uL — ABNORMAL LOW (ref 1.0–3.6)
MCH: 28.8 pg (ref 26.0–34.0)
MCHC: 34 g/dL (ref 32.0–36.0)
MCV: 84.7 fL (ref 80.0–100.0)
Monocytes Absolute: 0.9 10*3/uL (ref 0.2–1.0)
Monocytes Relative: 17 %
NEUTROS PCT: 58 %
Neutro Abs: 3.2 10*3/uL (ref 1.4–6.5)
PLATELETS: 244 10*3/uL (ref 150–440)
RBC: 2.75 MIL/uL — AB (ref 4.40–5.90)
RDW: 15.4 % — ABNORMAL HIGH (ref 11.5–14.5)
WBC: 5.5 10*3/uL (ref 3.8–10.6)

## 2016-11-13 ENCOUNTER — Telehealth: Payer: Self-pay | Admitting: Cardiovascular Disease

## 2016-11-13 NOTE — Telephone Encounter (Signed)
Dr. Janace Litten called and spoke with Dr. Rockey Situ regarding patient and his fluid retention with tight legs requesting IV torsemide. Called and spoke with Larene Beach over at [facility] with instructions from Dr. Rockey Situ;  1. Torsemide 80 mg IV Once daily for 3 days 2. Hold AM dose of torsemide 20 mg tablet 3. Continue torsemide 20 mg tablet in the evening 4. Start Unna boots  Shannon clarified potassium and Dr. Rockey Situ reviewed all labs and will hold potassium for now based on renal function. She did request that we speak with family regarding possible Hospice. Let her know that I would relay this message and if she needed any additional information to please give [place] a call back.

## 2016-11-14 ENCOUNTER — Other Ambulatory Visit: Payer: Self-pay | Admitting: *Deleted

## 2016-11-14 NOTE — Patient Outreach (Signed)
[facility]) Care Management  11/14/2016  [name] 02-Sep-1927 [id number]   Phone call to [name] discharge planner at [facility] to discuss results of family care meeting that took place  on 11/13/16.  [name] will be out of the office until next week, however left contact information for covering discharge planner [name]  [phone]. Voicemail message left for her requesting a return call.   Plan; Home visit scheduled for 11/15/16.   [name] [facility] Care Management [phone]

## 2016-11-15 ENCOUNTER — Ambulatory Visit: Payer: Self-pay | Admitting: *Deleted

## 2016-11-15 ENCOUNTER — Other Ambulatory Visit: Payer: Self-pay | Admitting: *Deleted

## 2016-11-15 ENCOUNTER — Non-Acute Institutional Stay (SKILLED_NURSING_FACILITY): Payer: Medicare Other | Admitting: Gerontology

## 2016-11-15 DIAGNOSIS — I5033 Acute on chronic diastolic (congestive) heart failure: Secondary | ICD-10-CM | POA: Diagnosis not present

## 2016-11-15 DIAGNOSIS — R350 Frequency of micturition: Secondary | ICD-10-CM

## 2016-11-15 DIAGNOSIS — R600 Localized edema: Secondary | ICD-10-CM | POA: Diagnosis not present

## 2016-11-15 NOTE — Patient Outreach (Signed)
[facility]) Care Management  11/15/2016  [name] February 21, 1928 [id number]   Return phone call from Delia Heady office manager at [facility] to discuss family care meeting that took lace on 11/13/16. Coralyn Mark is covering for [employer] while she is on vacation. Per Coralyn Mark, patient has been made ware of Hospice recommendation and need for 24 hour care. Patient continues to decline Hospice care, however family is leaning towards long term care for patient .  Family willing for discharge planner to submit a Medicaid bed search upon her return from vacation on 11/18/16. Patient's family will have to apply for long term medicaid for patient.  Long term Care option is not definite however. Final decision to be discussed upon discharge planners return on 11/18/16.   Plan: This Education officer, museum will follow up with patient and discharge planner next week.    [name] [facility] Care Management [phone]

## 2016-11-18 ENCOUNTER — Other Ambulatory Visit
Admission: RE | Admit: 2016-11-18 | Discharge: 2016-11-18 | Disposition: A | Discharge status: Home / Self Care | Payer: Medicare Other | Source: Skilled Nursing Facility | Attending: Gerontology | Admitting: Gerontology

## 2016-11-18 DIAGNOSIS — I5032 Chronic diastolic (congestive) heart failure: Secondary | ICD-10-CM | POA: Insufficient documentation

## 2016-11-18 LAB — COMPREHENSIVE METABOLIC PANEL
ALT: 12 U/L — ABNORMAL LOW (ref 17–63)
AST: 21 U/L (ref 15–41)
Albumin: 3.2 g/dL — ABNORMAL LOW (ref 3.5–5.0)
Alkaline Phosphatase: 116 U/L (ref 38–126)
Anion gap: 13 (ref 5–15)
BILIRUBIN TOTAL: 1 mg/dL (ref 0.3–1.2)
BUN: 94 mg/dL — ABNORMAL HIGH (ref 6–20)
CO2: 27 mmol/L (ref 22–32)
Calcium: 8.6 mg/dL — ABNORMAL LOW (ref 8.9–10.3)
Chloride: 91 mmol/L — ABNORMAL LOW (ref 101–111)
Creatinine, Ser: 3.16 mg/dL — ABNORMAL HIGH (ref 0.61–1.24)
GFR, EST AFRICAN AMERICAN: 19 mL/min — AB (ref >60)
GFR, EST NON AFRICAN AMERICAN: 16 mL/min — AB (ref >60)
Glucose, Bld: 225 mg/dL — ABNORMAL HIGH (ref 65–99)
POTASSIUM: 4.6 mmol/L (ref 3.5–5.1)
Sodium: 131 mmol/L — ABNORMAL LOW (ref 135–145)
TOTAL PROTEIN: 6.6 g/dL (ref 6.5–8.1)

## 2016-11-18 LAB — CBC WITH DIFFERENTIAL/PLATELET
BASOS ABS: 0.1 10*3/uL (ref 0–0.1)
Basophils Relative: 1 %
EOS PCT: 9 %
Eosinophils Absolute: 0.6 10*3/uL (ref 0–0.7)
HEMATOCRIT: 23.8 % — AB (ref 40.0–52.0)
Hemoglobin: 7.9 g/dL — ABNORMAL LOW (ref 13.0–18.0)
LYMPHS PCT: 7 %
Lymphs Abs: 0.5 10*3/uL — ABNORMAL LOW (ref 1.0–3.6)
MCH: 28.3 pg (ref 26.0–34.0)
MCHC: 33.1 g/dL (ref 32.0–36.0)
MCV: 85.5 fL (ref 80.0–100.0)
Monocytes Absolute: 0.9 10*3/uL (ref 0.2–1.0)
Monocytes Relative: 12 %
Neutro Abs: 5.1 10*3/uL (ref 1.4–6.5)
Neutrophils Relative %: 71 %
PLATELETS: 196 10*3/uL (ref 150–440)
RBC: 2.79 MIL/uL — AB (ref 4.40–5.90)
RDW: 16 % — ABNORMAL HIGH (ref 11.5–14.5)
WBC: 7.2 10*3/uL (ref 3.8–10.6)

## 2016-11-19 ENCOUNTER — Inpatient Hospital Stay: Payer: No Typology Code available for payment source

## 2016-11-19 ENCOUNTER — Telehealth: Payer: Self-pay | Admitting: *Deleted

## 2016-11-19 ENCOUNTER — Other Ambulatory Visit: Payer: Self-pay | Admitting: *Deleted

## 2016-11-19 ENCOUNTER — Non-Acute Institutional Stay (SKILLED_NURSING_FACILITY): Payer: Medicare Other | Admitting: Gerontology

## 2016-11-19 VITALS — BP 134/66

## 2016-11-19 DIAGNOSIS — D631 Anemia in chronic kidney disease: Secondary | ICD-10-CM | POA: Diagnosis not present

## 2016-11-19 DIAGNOSIS — N184 Chronic kidney disease, stage 4 (severe): Principal | ICD-10-CM

## 2016-11-19 DIAGNOSIS — R350 Frequency of micturition: Secondary | ICD-10-CM | POA: Diagnosis not present

## 2016-11-19 DIAGNOSIS — I129 Hypertensive chronic kidney disease with stage 1 through stage 4 chronic kidney disease, or unspecified chronic kidney disease: Secondary | ICD-10-CM | POA: Diagnosis not present

## 2016-11-19 DIAGNOSIS — R600 Localized edema: Secondary | ICD-10-CM

## 2016-11-19 DIAGNOSIS — I5033 Acute on chronic diastolic (congestive) heart failure: Secondary | ICD-10-CM

## 2016-11-19 DIAGNOSIS — Z79899 Other long term (current) drug therapy: Secondary | ICD-10-CM | POA: Diagnosis not present

## 2016-11-19 DIAGNOSIS — D649 Anemia, unspecified: Secondary | ICD-10-CM

## 2016-11-19 DIAGNOSIS — D638 Anemia in other chronic diseases classified elsewhere: Secondary | ICD-10-CM | POA: Diagnosis not present

## 2016-11-19 DIAGNOSIS — D508 Other iron deficiency anemias: Secondary | ICD-10-CM

## 2016-11-19 LAB — HEMOGLOBIN: Hemoglobin: 7.4 g/dL — ABNORMAL LOW (ref 13.0–18.0)

## 2016-11-19 LAB — PREPARE RBC (CROSSMATCH)

## 2016-11-19 LAB — FERRITIN: Ferritin: 87 ng/mL (ref 24–336)

## 2016-11-19 MED ORDER — EPOETIN ALFA 10000 UNIT/ML IJ SOLN
10000.0000 [IU] | Freq: Once | INTRAMUSCULAR | Status: AC
  Administered 2016-11-19: 10000 [IU] via SUBCUTANEOUS
  Filled 2016-11-19: qty 2

## 2016-11-19 NOTE — Telephone Encounter (Signed)
Critical Lab - Hgb. 7.5  MD aware.

## 2016-11-19 NOTE — Progress Notes (Signed)
Patient's hemoglobin is 7.4 today.  Discussed with Dr. Mike Gip, she would like him to receive Procrit today and return tomorrow for 1 unit of blood.

## 2016-11-19 NOTE — Telephone Encounter (Signed)
Left voicemail message to call back  

## 2016-11-19 NOTE — Telephone Encounter (Signed)
Can we call to get an update on the patient and his leg swelling?

## 2016-11-20 ENCOUNTER — Other Ambulatory Visit: Payer: Self-pay | Admitting: *Deleted

## 2016-11-20 ENCOUNTER — Inpatient Hospital Stay: Payer: No Typology Code available for payment source

## 2016-11-20 DIAGNOSIS — N184 Chronic kidney disease, stage 4 (severe): Principal | ICD-10-CM

## 2016-11-20 DIAGNOSIS — I129 Hypertensive chronic kidney disease with stage 1 through stage 4 chronic kidney disease, or unspecified chronic kidney disease: Secondary | ICD-10-CM | POA: Diagnosis not present

## 2016-11-20 DIAGNOSIS — D631 Anemia in chronic kidney disease: Secondary | ICD-10-CM

## 2016-11-20 DIAGNOSIS — D649 Anemia, unspecified: Secondary | ICD-10-CM

## 2016-11-20 DIAGNOSIS — Z79899 Other long term (current) drug therapy: Secondary | ICD-10-CM | POA: Diagnosis not present

## 2016-11-20 MED ORDER — HYDROCODONE-ACETAMINOPHEN 5-325 MG PO TABS
1.0000 | ORAL_TABLET | Freq: Once | ORAL | Status: AC
  Administered 2016-11-20: 1 via ORAL
  Filled 2016-11-20: qty 1

## 2016-11-20 MED ORDER — DIPHENHYDRAMINE HCL 25 MG PO CAPS
25.0000 mg | ORAL_CAPSULE | Freq: Once | ORAL | Status: AC
  Administered 2016-11-20: 25 mg via ORAL
  Filled 2016-11-20: qty 1

## 2016-11-20 MED ORDER — SODIUM CHLORIDE 0.9 % IV SOLN
250.0000 mL | Freq: Once | INTRAVENOUS | Status: AC
  Administered 2016-11-20: 250 mL via INTRAVENOUS
  Filled 2016-11-20: qty 250

## 2016-11-20 MED ORDER — ACETAMINOPHEN 325 MG PO TABS
650.0000 mg | ORAL_TABLET | Freq: Once | ORAL | Status: AC
  Administered 2016-11-20: 650 mg via ORAL
  Filled 2016-11-20: qty 2

## 2016-11-20 NOTE — Telephone Encounter (Signed)
Spoke with Larene Beach and she reports that his vitals are stable, legs are a little softer but thinks that he may be starting to fill back up and trying to get some cellulitis. He went to cancer center and got procrit and then today he went to get blood transfusion. She spoke with Dr. Acie Fredrickson and Mr. Glasscock agreed with being a DNR and palliative has seen him again and unsure of those plans at this time. Dr. Acie Fredrickson is wanting to transfer him to a long term center in [place] to be closer to him. She reports that the previous IV lasix did help the legs to be a little softer but did not have any impact on his weight. Current weight was 251.3 at this time. Let her know that I would forward this note over to Dr. Rockey Situ to make him aware that we spoke and to give [place] a call if we can assist any further.

## 2016-11-20 NOTE — Telephone Encounter (Signed)
Left voicemail message that we were calling to check in on patient and to call back when possible.

## 2016-11-20 NOTE — Patient Outreach (Addendum)
[facility]) Care Management  [facility] Social Work  11/20/2016  [name] August 13, 1927 [id number]  Subjective:  Patient out at a doctor's appointment when this social worker arrived for visit. Spoke with discharge planner [name] who reports that the recommendation is 24 hour care. Patient's family  interested in long term care and has agreed to  [facility] search. Bed offer received from [facility], however acceptance of this bed remains pending.  Patient's family would like to more options regarding bed offer before final  decision is made.  Objective:   Encounter Medications:  Outpatient Encounter Prescriptions as of 11/20/2016  Medication Sig Note  . acetaminophen (TYLENOL) 325 MG tablet Take 2 tablets (650 mg total) by mouth every 6 (six) hours as needed for mild pain (or Fever >/= 101).   [name] aspirin EC 81 MG tablet Take 1 tablet (81 mg total) by mouth daily.   [name] azelastine (OPTIVAR) 0.05 % ophthalmic solution Place 1 drop into the right eye 2 (two) times daily.   . calcitRIOL (ROCALTROL) 0.25 MCG capsule TAKE ONE CAPSULE THREE TIMES A WEEK (MONDAY, WEDNESDAY, AND FRIDAY)   . carvedilol (COREG) 6.25 MG tablet Take 1 tablet (6.25 mg total) by mouth 2 (two) times daily.   . cholecalciferol (VITAMIN D) 1000 units tablet Take 1,000 Units by mouth daily.   [name] escitalopram (LEXAPRO) 10 MG tablet TAKE 1 TABLET EVERY DAY WITH DINNER   . ferrous sulfate 325 (65 FE) MG tablet Take 1 tablet (325 mg total) by mouth 2 (two) times daily with a meal.   . HYDROcodone-acetaminophen (NORCO/VICODIN) 5-325 MG tablet Take 1 tablet by mouth daily as needed for moderate pain. DO NOT EXCEED 4GM OF APAP IN 24 HOURS FROM ALL SOURCES   . hydroxychloroquine (PLAQUENIL) 200 MG tablet Take 200 mg by mouth daily.   . insulin aspart (NOVOLOG) 100 UNIT/ML injection Inject 4 Units into the skin 3 (three) times daily before meals. (Patient taking differently: Inject 0-15 Units into the skin 3  (three) times daily before meals. ) 09/23/2016: Takes according to sliding scale.  . insulin detemir (LEVEMIR) 100 UNIT/ML injection Inject 0.35 mLs (35 Units total) into the skin at bedtime. (Patient not taking: Reported on 11/05/2016)   . isosorbide mononitrate (IMDUR) 30 MG 24 hr tablet Take 30 mg by mouth daily.   [name] LANTUS 100 UNIT/ML injection    . loratadine (CLARITIN) 10 MG tablet Take 10 mg by mouth daily.   . Multiple Vitamin (MULTIVITAMIN WITH MINERALS) TABS tablet Take 1 tablet by mouth daily.   . Multiple Vitamins-Minerals (PRESERVISION AREDS 2) CAPS Take 1 capsule by mouth 2 (two) times daily.   . pantoprazole (PROTONIX) 40 MG tablet Take 1 tablet (40 mg total) by mouth daily.   [name] spironolactone (ALDACTONE) 25 MG tablet Take 25 mg by mouth daily.   . tamsulosin (FLOMAX) 0.4 MG CAPS capsule TAKE 1 CAPSULE BY MOUTH DAILY AFTER SUPPER   . torsemide (DEMADEX) 20 MG tablet Take 2 tablets (40 mg total) by mouth 2 (two) times daily.   . vitamin C (ASCORBIC ACID) 500 MG tablet Take 500 mg by mouth daily.   [name] zolpidem (AMBIEN) 5 MG tablet Take 1 tablet (5 mg total) by mouth at bedtime as needed for sleep.    No facility-administered encounter medications on file as of 11/20/2016.     Functional Status:  In your present state of health, do you have any difficulty performing the following activities: 11/07/2016 10/20/2016  Hearing? Tempie Donning  Vision? N N  Difficulty concentrating or making decisions? N N  Walking or climbing stairs? Y Y  Dressing or bathing? Y Y  Doing errands, shopping? Y N  Preparing Food and eating ? Y -  Using the Toilet? Y -  In the past six months, have you accidently leaked urine? Y -  Do you have problems with loss of bowel control? Y -  Managing your Medications? N -  Managing your Finances? Y -  Housekeeping or managing your Housekeeping? Y -  Some recent data might be hidden    Fall/Depression Screening:  PHQ 2/9 Scores 11/07/2016 09/05/2016 07/25/2016 04/17/2016  09/13/2015 09/01/2014 07/21/2014  PHQ - 2 Score 0 0 0 0 0 4 0  PHQ- 9 Score - - - - - 13 -    Assessment: 24 hour care recommended by SNF. Patient's family looking into long term care options and have been counseled by discharge planner regarding limited availability. ! Bed offer received for [facility] nursing home.  Plan:  This Education officer, museum will follow up with patient within 2 weeks to confirm long term plan and develop additional care plan goals.    [name] [facility] Care Management [phone]

## 2016-11-21 ENCOUNTER — Other Ambulatory Visit: Payer: Self-pay | Admitting: *Deleted

## 2016-11-21 LAB — BPAM RBC
Blood Product Expiration Date: 201806302359
ISSUE DATE / TIME: 201806130939
Unit Type and Rh: 5100

## 2016-11-21 LAB — TYPE AND SCREEN
ABO/RH(D): O POS
Antibody Screen: NEGATIVE
Unit division: 0

## 2016-11-22 ENCOUNTER — Encounter: Payer: Self-pay | Admitting: *Deleted

## 2016-11-22 ENCOUNTER — Other Ambulatory Visit: Payer: Self-pay | Admitting: *Deleted

## 2016-11-22 DIAGNOSIS — R278 Other lack of coordination: Secondary | ICD-10-CM | POA: Diagnosis not present

## 2016-11-22 DIAGNOSIS — I5032 Chronic diastolic (congestive) heart failure: Secondary | ICD-10-CM | POA: Diagnosis not present

## 2016-11-22 DIAGNOSIS — Z79899 Other long term (current) drug therapy: Secondary | ICD-10-CM | POA: Diagnosis not present

## 2016-11-22 DIAGNOSIS — N184 Chronic kidney disease, stage 4 (severe): Secondary | ICD-10-CM | POA: Diagnosis not present

## 2016-11-22 DIAGNOSIS — R55 Syncope and collapse: Secondary | ICD-10-CM | POA: Diagnosis not present

## 2016-11-22 DIAGNOSIS — D638 Anemia in other chronic diseases classified elsewhere: Secondary | ICD-10-CM | POA: Diagnosis not present

## 2016-11-22 DIAGNOSIS — Z0189 Encounter for other specified special examinations: Secondary | ICD-10-CM | POA: Diagnosis not present

## 2016-11-22 DIAGNOSIS — R41841 Cognitive communication deficit: Secondary | ICD-10-CM | POA: Diagnosis not present

## 2016-11-22 DIAGNOSIS — G629 Polyneuropathy, unspecified: Secondary | ICD-10-CM | POA: Diagnosis not present

## 2016-11-22 DIAGNOSIS — D500 Iron deficiency anemia secondary to blood loss (chronic): Secondary | ICD-10-CM | POA: Diagnosis not present

## 2016-11-22 DIAGNOSIS — E119 Type 2 diabetes mellitus without complications: Secondary | ICD-10-CM | POA: Diagnosis not present

## 2016-11-22 DIAGNOSIS — I509 Heart failure, unspecified: Secondary | ICD-10-CM | POA: Diagnosis not present

## 2016-11-22 DIAGNOSIS — R54 Age-related physical debility: Secondary | ICD-10-CM | POA: Diagnosis not present

## 2016-11-22 DIAGNOSIS — M6281 Muscle weakness (generalized): Secondary | ICD-10-CM | POA: Diagnosis not present

## 2016-11-22 DIAGNOSIS — D631 Anemia in chronic kidney disease: Secondary | ICD-10-CM | POA: Diagnosis not present

## 2016-11-22 DIAGNOSIS — E222 Syndrome of inappropriate secretion of antidiuretic hormone: Secondary | ICD-10-CM | POA: Diagnosis not present

## 2016-11-22 DIAGNOSIS — I129 Hypertensive chronic kidney disease with stage 1 through stage 4 chronic kidney disease, or unspecified chronic kidney disease: Secondary | ICD-10-CM | POA: Diagnosis not present

## 2016-11-22 DIAGNOSIS — R2681 Unsteadiness on feet: Secondary | ICD-10-CM | POA: Diagnosis not present

## 2016-11-22 NOTE — Patient Outreach (Signed)
[facility]) Care Management  11/22/2016  [name] Oct 20, 1927 [id number]   Secure email received from discharge planner on 11/21/16  at [facility] stating that patient transferred to [facility] for long term care on 11/21/16.   Plan: Patient to be closed to [facility] care management.   [name] [facility] Care Management [phone]

## 2016-11-22 NOTE — Patient Outreach (Signed)
[facility]) Care Management  11/22/2016  [name] 1927/11/27 [id number]   Phone call to patient to confirm his transfer to [facility] center   Patient confirmed transfer, stating that his stay was going well there so far.  This Education officer, museum informed him of need to close his case to [facility] services due to his move to long term care.  Patient reminded of contact information previously provided to utilize if needed in the future.  Plan: Patient to be closed to [facility] care management at this time due to patient's transition to long term care.    [name] [facility] Care Management [phone]

## 2016-11-23 DIAGNOSIS — M6281 Muscle weakness (generalized): Secondary | ICD-10-CM | POA: Diagnosis not present

## 2016-11-23 DIAGNOSIS — R278 Other lack of coordination: Secondary | ICD-10-CM | POA: Diagnosis not present

## 2016-11-23 DIAGNOSIS — R54 Age-related physical debility: Secondary | ICD-10-CM | POA: Diagnosis not present

## 2016-11-23 DIAGNOSIS — R41841 Cognitive communication deficit: Secondary | ICD-10-CM | POA: Diagnosis not present

## 2016-11-23 DIAGNOSIS — D500 Iron deficiency anemia secondary to blood loss (chronic): Secondary | ICD-10-CM | POA: Diagnosis not present

## 2016-11-23 DIAGNOSIS — R2681 Unsteadiness on feet: Secondary | ICD-10-CM | POA: Diagnosis not present

## 2016-11-24 ENCOUNTER — Encounter: Payer: Self-pay | Admitting: Hematology and Oncology

## 2016-11-25 DIAGNOSIS — R54 Age-related physical debility: Secondary | ICD-10-CM | POA: Diagnosis not present

## 2016-11-25 DIAGNOSIS — R41841 Cognitive communication deficit: Secondary | ICD-10-CM | POA: Diagnosis not present

## 2016-11-25 DIAGNOSIS — D500 Iron deficiency anemia secondary to blood loss (chronic): Secondary | ICD-10-CM | POA: Diagnosis not present

## 2016-11-25 DIAGNOSIS — R2681 Unsteadiness on feet: Secondary | ICD-10-CM | POA: Diagnosis not present

## 2016-11-25 DIAGNOSIS — M6281 Muscle weakness (generalized): Secondary | ICD-10-CM | POA: Diagnosis not present

## 2016-11-25 DIAGNOSIS — R278 Other lack of coordination: Secondary | ICD-10-CM | POA: Diagnosis not present

## 2016-11-26 DIAGNOSIS — M6281 Muscle weakness (generalized): Secondary | ICD-10-CM | POA: Diagnosis not present

## 2016-11-26 DIAGNOSIS — R54 Age-related physical debility: Secondary | ICD-10-CM | POA: Diagnosis not present

## 2016-11-26 DIAGNOSIS — L89153 Pressure ulcer of sacral region, stage 3: Secondary | ICD-10-CM | POA: Diagnosis not present

## 2016-11-26 DIAGNOSIS — D500 Iron deficiency anemia secondary to blood loss (chronic): Secondary | ICD-10-CM | POA: Diagnosis not present

## 2016-11-26 DIAGNOSIS — R2681 Unsteadiness on feet: Secondary | ICD-10-CM | POA: Diagnosis not present

## 2016-11-26 DIAGNOSIS — S71102D Unspecified open wound, left thigh, subsequent encounter: Secondary | ICD-10-CM | POA: Diagnosis not present

## 2016-11-26 DIAGNOSIS — S0100XD Unspecified open wound of scalp, subsequent encounter: Secondary | ICD-10-CM | POA: Diagnosis not present

## 2016-11-26 DIAGNOSIS — R41841 Cognitive communication deficit: Secondary | ICD-10-CM | POA: Diagnosis not present

## 2016-11-26 DIAGNOSIS — R278 Other lack of coordination: Secondary | ICD-10-CM | POA: Diagnosis not present

## 2016-11-26 DIAGNOSIS — S51002D Unspecified open wound of left elbow, subsequent encounter: Secondary | ICD-10-CM | POA: Diagnosis not present

## 2016-11-27 DIAGNOSIS — R278 Other lack of coordination: Secondary | ICD-10-CM | POA: Diagnosis not present

## 2016-11-27 DIAGNOSIS — D500 Iron deficiency anemia secondary to blood loss (chronic): Secondary | ICD-10-CM | POA: Diagnosis not present

## 2016-11-27 DIAGNOSIS — R54 Age-related physical debility: Secondary | ICD-10-CM | POA: Diagnosis not present

## 2016-11-27 DIAGNOSIS — R2681 Unsteadiness on feet: Secondary | ICD-10-CM | POA: Diagnosis not present

## 2016-11-27 DIAGNOSIS — R41841 Cognitive communication deficit: Secondary | ICD-10-CM | POA: Diagnosis not present

## 2016-11-27 DIAGNOSIS — M6281 Muscle weakness (generalized): Secondary | ICD-10-CM | POA: Diagnosis not present
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
## 2016-11-28 DIAGNOSIS — M6281 Muscle weakness (generalized): Secondary | ICD-10-CM | POA: Diagnosis not present

## 2016-11-28 DIAGNOSIS — R278 Other lack of coordination: Secondary | ICD-10-CM | POA: Diagnosis not present

## 2016-11-28 DIAGNOSIS — R0689 Other abnormalities of breathing: Secondary | ICD-10-CM | POA: Diagnosis not present

## 2016-11-28 DIAGNOSIS — Z79899 Other long term (current) drug therapy: Secondary | ICD-10-CM | POA: Diagnosis not present

## 2016-11-28 DIAGNOSIS — D500 Iron deficiency anemia secondary to blood loss (chronic): Secondary | ICD-10-CM | POA: Diagnosis not present

## 2016-11-28 DIAGNOSIS — R54 Age-related physical debility: Secondary | ICD-10-CM | POA: Diagnosis not present

## 2016-11-28 DIAGNOSIS — R2681 Unsteadiness on feet: Secondary | ICD-10-CM | POA: Diagnosis not present

## 2016-11-28 DIAGNOSIS — R41841 Cognitive communication deficit: Secondary | ICD-10-CM | POA: Diagnosis not present

## 2016-11-29 DIAGNOSIS — E1121 Type 2 diabetes mellitus with diabetic nephropathy: Secondary | ICD-10-CM | POA: Diagnosis not present

## 2016-11-29 DIAGNOSIS — R2681 Unsteadiness on feet: Secondary | ICD-10-CM | POA: Diagnosis not present

## 2016-11-29 DIAGNOSIS — D500 Iron deficiency anemia secondary to blood loss (chronic): Secondary | ICD-10-CM | POA: Diagnosis not present

## 2016-11-29 DIAGNOSIS — I5032 Chronic diastolic (congestive) heart failure: Secondary | ICD-10-CM | POA: Diagnosis not present

## 2016-11-29 DIAGNOSIS — R54 Age-related physical debility: Secondary | ICD-10-CM | POA: Diagnosis not present

## 2016-11-29 DIAGNOSIS — R2689 Other abnormalities of gait and mobility: Secondary | ICD-10-CM | POA: Diagnosis not present

## 2016-11-29 DIAGNOSIS — N184 Chronic kidney disease, stage 4 (severe): Secondary | ICD-10-CM | POA: Diagnosis not present

## 2016-11-29 DIAGNOSIS — R278 Other lack of coordination: Secondary | ICD-10-CM | POA: Diagnosis not present

## 2016-11-29 DIAGNOSIS — M6281 Muscle weakness (generalized): Secondary | ICD-10-CM | POA: Diagnosis not present

## 2016-11-29 DIAGNOSIS — R41841 Cognitive communication deficit: Secondary | ICD-10-CM | POA: Diagnosis not present

## 2016-11-29 DIAGNOSIS — D6489 Other specified anemias: Secondary | ICD-10-CM | POA: Diagnosis not present

## 2016-11-29 DIAGNOSIS — L89159 Pressure ulcer of sacral region, unspecified stage: Secondary | ICD-10-CM | POA: Diagnosis not present

## 2016-12-02 DIAGNOSIS — M6281 Muscle weakness (generalized): Secondary | ICD-10-CM | POA: Diagnosis not present

## 2016-12-02 DIAGNOSIS — R2681 Unsteadiness on feet: Secondary | ICD-10-CM | POA: Diagnosis not present

## 2016-12-02 DIAGNOSIS — R54 Age-related physical debility: Secondary | ICD-10-CM | POA: Diagnosis not present

## 2016-12-02 DIAGNOSIS — R278 Other lack of coordination: Secondary | ICD-10-CM | POA: Diagnosis not present

## 2016-12-02 DIAGNOSIS — R41841 Cognitive communication deficit: Secondary | ICD-10-CM | POA: Diagnosis not present

## 2016-12-02 DIAGNOSIS — D500 Iron deficiency anemia secondary to blood loss (chronic): Secondary | ICD-10-CM | POA: Diagnosis not present

## 2016-12-03 ENCOUNTER — Inpatient Hospital Stay: Payer: No Typology Code available for payment source

## 2016-12-03 VITALS — BP 132/66

## 2016-12-03 DIAGNOSIS — N184 Chronic kidney disease, stage 4 (severe): Principal | ICD-10-CM

## 2016-12-03 DIAGNOSIS — Z79899 Other long term (current) drug therapy: Secondary | ICD-10-CM | POA: Diagnosis not present

## 2016-12-03 DIAGNOSIS — D631 Anemia in chronic kidney disease: Secondary | ICD-10-CM | POA: Diagnosis not present

## 2016-12-03 DIAGNOSIS — D500 Iron deficiency anemia secondary to blood loss (chronic): Secondary | ICD-10-CM | POA: Diagnosis not present

## 2016-12-03 DIAGNOSIS — M6281 Muscle weakness (generalized): Secondary | ICD-10-CM | POA: Diagnosis not present

## 2016-12-03 DIAGNOSIS — R2681 Unsteadiness on feet: Secondary | ICD-10-CM | POA: Diagnosis not present

## 2016-12-03 DIAGNOSIS — R54 Age-related physical debility: Secondary | ICD-10-CM | POA: Diagnosis not present

## 2016-12-03 DIAGNOSIS — R41841 Cognitive communication deficit: Secondary | ICD-10-CM | POA: Diagnosis not present

## 2016-12-03 DIAGNOSIS — D649 Anemia, unspecified: Secondary | ICD-10-CM

## 2016-12-03 DIAGNOSIS — R278 Other lack of coordination: Secondary | ICD-10-CM | POA: Diagnosis not present

## 2016-12-03 DIAGNOSIS — I129 Hypertensive chronic kidney disease with stage 1 through stage 4 chronic kidney disease, or unspecified chronic kidney disease: Secondary | ICD-10-CM | POA: Diagnosis not present

## 2016-12-03 LAB — CBC WITH DIFFERENTIAL/PLATELET
Basophils Absolute: 0.1 10*3/uL (ref 0–0.1)
Basophils Relative: 1 %
Eosinophils Absolute: 0.5 10*3/uL (ref 0–0.7)
Eosinophils Relative: 7 %
HCT: 23.9 % — ABNORMAL LOW (ref 40.0–52.0)
Hemoglobin: 8.2 g/dL — ABNORMAL LOW (ref 13.0–18.0)
Lymphocytes Relative: 11 %
Lymphs Abs: 0.7 10*3/uL — ABNORMAL LOW (ref 1.0–3.6)
MCH: 28.5 pg (ref 26.0–34.0)
MCHC: 34.1 g/dL (ref 32.0–36.0)
MCV: 83.6 fL (ref 80.0–100.0)
Monocytes Absolute: 0.8 10*3/uL (ref 0.2–1.0)
Monocytes Relative: 11 %
Neutro Abs: 4.7 10*3/uL (ref 1.4–6.5)
Neutrophils Relative %: 70 %
Platelets: 299 10*3/uL (ref 150–440)
RBC: 2.86 MIL/uL — ABNORMAL LOW (ref 4.40–5.90)
RDW: 16 % — ABNORMAL HIGH (ref 11.5–14.5)
WBC: 6.8 10*3/uL (ref 3.8–10.6)

## 2016-12-03 LAB — TYPE AND SCREEN
ABO/RH(D): O POS
Antibody Screen: NEGATIVE

## 2016-12-03 MED ORDER — EPOETIN ALFA 10000 UNIT/ML IJ SOLN
10000.0000 [IU] | Freq: Once | INTRAMUSCULAR | Status: AC
  Administered 2016-12-03: 10000 [IU] via SUBCUTANEOUS
  Filled 2016-12-03: qty 2

## 2016-12-04 ENCOUNTER — Encounter: Payer: Self-pay | Admitting: *Deleted

## 2016-12-04 DIAGNOSIS — R41841 Cognitive communication deficit: Secondary | ICD-10-CM | POA: Diagnosis not present

## 2016-12-04 DIAGNOSIS — D500 Iron deficiency anemia secondary to blood loss (chronic): Secondary | ICD-10-CM | POA: Diagnosis not present

## 2016-12-04 DIAGNOSIS — M6281 Muscle weakness (generalized): Secondary | ICD-10-CM | POA: Diagnosis not present

## 2016-12-04 DIAGNOSIS — R54 Age-related physical debility: Secondary | ICD-10-CM | POA: Diagnosis not present

## 2016-12-04 DIAGNOSIS — R2681 Unsteadiness on feet: Secondary | ICD-10-CM | POA: Diagnosis not present

## 2016-12-04 DIAGNOSIS — R278 Other lack of coordination: Secondary | ICD-10-CM | POA: Diagnosis not present

## 2016-12-05 DIAGNOSIS — Z79899 Other long term (current) drug therapy: Secondary | ICD-10-CM | POA: Diagnosis not present

## 2016-12-05 DIAGNOSIS — R0689 Other abnormalities of breathing: Secondary | ICD-10-CM | POA: Diagnosis not present

## 2016-12-05 DIAGNOSIS — D500 Iron deficiency anemia secondary to blood loss (chronic): Secondary | ICD-10-CM | POA: Diagnosis not present

## 2016-12-05 DIAGNOSIS — R278 Other lack of coordination: Secondary | ICD-10-CM | POA: Diagnosis not present

## 2016-12-05 DIAGNOSIS — R770 Abnormality of albumin: Secondary | ICD-10-CM | POA: Diagnosis not present

## 2016-12-05 DIAGNOSIS — R2681 Unsteadiness on feet: Secondary | ICD-10-CM | POA: Diagnosis not present

## 2016-12-05 DIAGNOSIS — R54 Age-related physical debility: Secondary | ICD-10-CM | POA: Diagnosis not present

## 2016-12-05 DIAGNOSIS — M6281 Muscle weakness (generalized): Secondary | ICD-10-CM | POA: Diagnosis not present

## 2016-12-05 DIAGNOSIS — R41841 Cognitive communication deficit: Secondary | ICD-10-CM | POA: Diagnosis not present

## 2016-12-06 DIAGNOSIS — R54 Age-related physical debility: Secondary | ICD-10-CM | POA: Diagnosis not present

## 2016-12-06 DIAGNOSIS — R278 Other lack of coordination: Secondary | ICD-10-CM | POA: Diagnosis not present

## 2016-12-06 DIAGNOSIS — R2681 Unsteadiness on feet: Secondary | ICD-10-CM | POA: Diagnosis not present

## 2016-12-06 DIAGNOSIS — R41841 Cognitive communication deficit: Secondary | ICD-10-CM | POA: Diagnosis not present

## 2016-12-06 DIAGNOSIS — D500 Iron deficiency anemia secondary to blood loss (chronic): Secondary | ICD-10-CM | POA: Diagnosis not present

## 2016-12-06 DIAGNOSIS — M6281 Muscle weakness (generalized): Secondary | ICD-10-CM | POA: Diagnosis not present

## 2016-12-10 DIAGNOSIS — R2681 Unsteadiness on feet: Secondary | ICD-10-CM | POA: Diagnosis not present

## 2016-12-10 DIAGNOSIS — R278 Other lack of coordination: Secondary | ICD-10-CM | POA: Diagnosis not present

## 2016-12-10 DIAGNOSIS — I5032 Chronic diastolic (congestive) heart failure: Secondary | ICD-10-CM | POA: Diagnosis not present

## 2016-12-10 DIAGNOSIS — S71102D Unspecified open wound, left thigh, subsequent encounter: Secondary | ICD-10-CM | POA: Diagnosis not present

## 2016-12-10 DIAGNOSIS — L89153 Pressure ulcer of sacral region, stage 3: Secondary | ICD-10-CM | POA: Diagnosis not present

## 2016-12-10 DIAGNOSIS — S51002D Unspecified open wound of left elbow, subsequent encounter: Secondary | ICD-10-CM | POA: Diagnosis not present

## 2016-12-10 DIAGNOSIS — S0100XD Unspecified open wound of scalp, subsequent encounter: Secondary | ICD-10-CM | POA: Diagnosis not present

## 2016-12-10 DIAGNOSIS — M6281 Muscle weakness (generalized): Secondary | ICD-10-CM | POA: Diagnosis not present

## 2016-12-10 DIAGNOSIS — R54 Age-related physical debility: Secondary | ICD-10-CM | POA: Diagnosis not present

## 2016-12-10 DIAGNOSIS — D62 Acute posthemorrhagic anemia: Secondary | ICD-10-CM | POA: Diagnosis not present

## 2016-12-11 DIAGNOSIS — M6281 Muscle weakness (generalized): Secondary | ICD-10-CM | POA: Diagnosis not present

## 2016-12-11 DIAGNOSIS — R278 Other lack of coordination: Secondary | ICD-10-CM | POA: Diagnosis not present

## 2016-12-11 DIAGNOSIS — I5032 Chronic diastolic (congestive) heart failure: Secondary | ICD-10-CM | POA: Diagnosis not present

## 2016-12-11 DIAGNOSIS — R2681 Unsteadiness on feet: Secondary | ICD-10-CM | POA: Diagnosis not present

## 2016-12-11 DIAGNOSIS — R54 Age-related physical debility: Secondary | ICD-10-CM | POA: Diagnosis not present

## 2016-12-11 DIAGNOSIS — D62 Acute posthemorrhagic anemia: Secondary | ICD-10-CM | POA: Diagnosis not present

## 2016-12-12 DIAGNOSIS — I5032 Chronic diastolic (congestive) heart failure: Secondary | ICD-10-CM | POA: Diagnosis not present

## 2016-12-12 DIAGNOSIS — R278 Other lack of coordination: Secondary | ICD-10-CM | POA: Diagnosis not present

## 2016-12-12 DIAGNOSIS — R54 Age-related physical debility: Secondary | ICD-10-CM | POA: Diagnosis not present

## 2016-12-12 DIAGNOSIS — R2681 Unsteadiness on feet: Secondary | ICD-10-CM | POA: Diagnosis not present

## 2016-12-12 DIAGNOSIS — M6281 Muscle weakness (generalized): Secondary | ICD-10-CM | POA: Diagnosis not present

## 2016-12-12 DIAGNOSIS — D62 Acute posthemorrhagic anemia: Secondary | ICD-10-CM | POA: Diagnosis not present

## 2016-12-13 DIAGNOSIS — R54 Age-related physical debility: Secondary | ICD-10-CM | POA: Diagnosis not present

## 2016-12-13 DIAGNOSIS — R278 Other lack of coordination: Secondary | ICD-10-CM | POA: Diagnosis not present

## 2016-12-13 DIAGNOSIS — D62 Acute posthemorrhagic anemia: Secondary | ICD-10-CM | POA: Diagnosis not present

## 2016-12-13 DIAGNOSIS — M6281 Muscle weakness (generalized): Secondary | ICD-10-CM | POA: Diagnosis not present

## 2016-12-13 DIAGNOSIS — R2681 Unsteadiness on feet: Secondary | ICD-10-CM | POA: Diagnosis not present

## 2016-12-13 DIAGNOSIS — I5032 Chronic diastolic (congestive) heart failure: Secondary | ICD-10-CM | POA: Diagnosis not present

## 2016-12-14 DIAGNOSIS — R278 Other lack of coordination: Secondary | ICD-10-CM | POA: Diagnosis not present

## 2016-12-14 DIAGNOSIS — D62 Acute posthemorrhagic anemia: Secondary | ICD-10-CM | POA: Diagnosis not present

## 2016-12-14 DIAGNOSIS — R54 Age-related physical debility: Secondary | ICD-10-CM | POA: Diagnosis not present

## 2016-12-14 DIAGNOSIS — M6281 Muscle weakness (generalized): Secondary | ICD-10-CM | POA: Diagnosis not present

## 2016-12-14 DIAGNOSIS — I5032 Chronic diastolic (congestive) heart failure: Secondary | ICD-10-CM | POA: Diagnosis not present

## 2016-12-14 DIAGNOSIS — R2681 Unsteadiness on feet: Secondary | ICD-10-CM | POA: Diagnosis not present

## 2016-12-17 ENCOUNTER — Inpatient Hospital Stay: Admitting: Hematology and Oncology

## 2016-12-17 ENCOUNTER — Inpatient Hospital Stay
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
## 2017-01-08 DEATH — deceased

## 2017-01-22 ENCOUNTER — Other Ambulatory Visit: Payer: Self-pay | Admitting: Nurse Practitioner

## 2017-02-05 ENCOUNTER — Other Ambulatory Visit: Payer: Self-pay | Admitting: Internal Medicine

## 2017-06-23 IMAGING — CT CT HEAD W/O CM
4 series · 17 of 47 positions shown, 19 images · non-contrast
Comparison: 05/29/2016

CLINICAL DATA: Recurrent falls, left frontal head injury

EXAM:
CT HEAD WITHOUT CONTRAST
TECHNIQUE: Contiguous axial images were obtained from the base of the skull
through the vertex without intravenous contrast.

[Series 2: head without · axial · non-contrast · 0.48mm/px · z∈[-211,-81]mm · 7 of 36 slices shown, 9 images]
[im 5/36  brain]
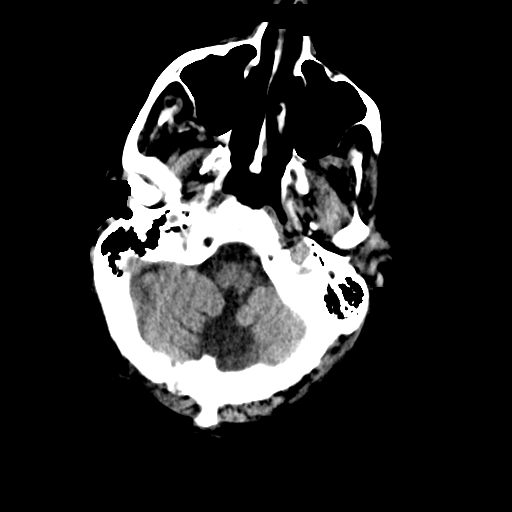
[im 5/36  bone]
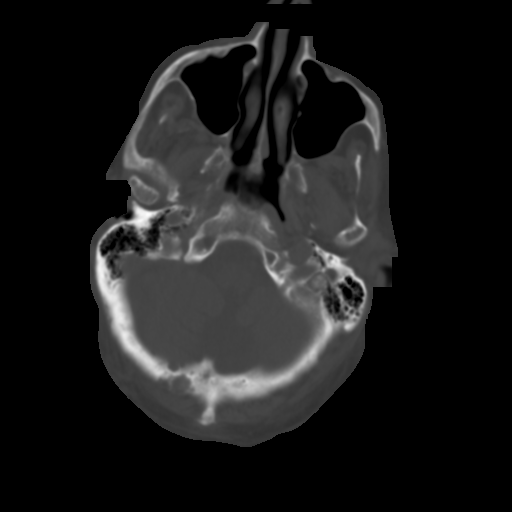
[im 9/36  brain]
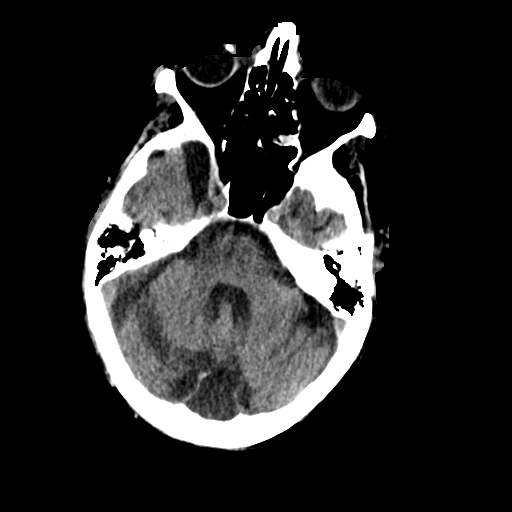
[im 14/36  brain]
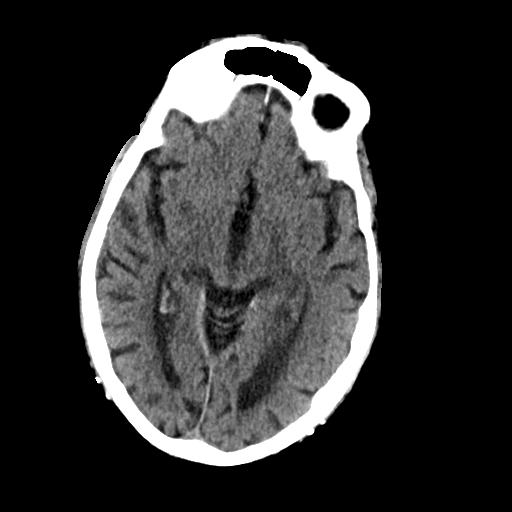
[im 18/36  brain]
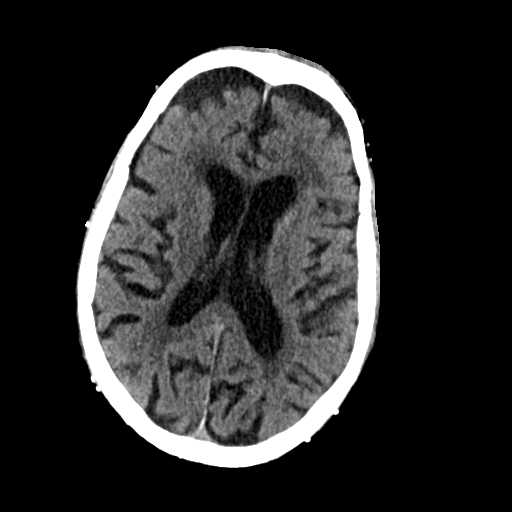
[im 22/36  brain]
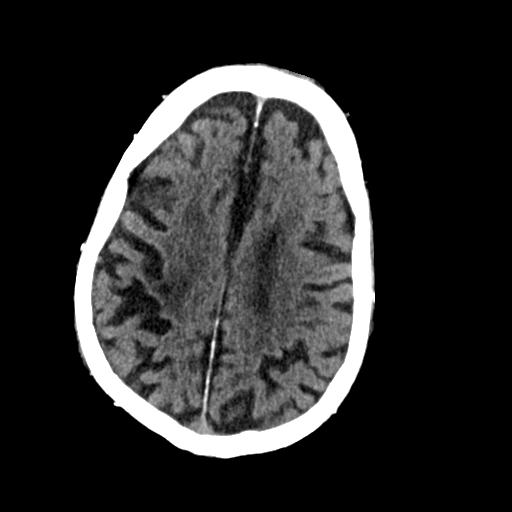
[im 22/36  bone]
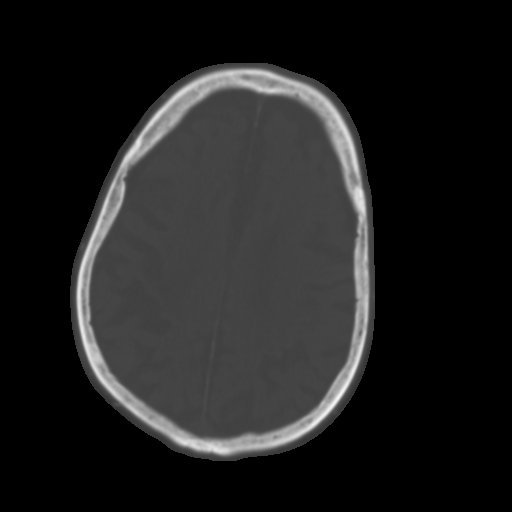
[im 27/36  brain]
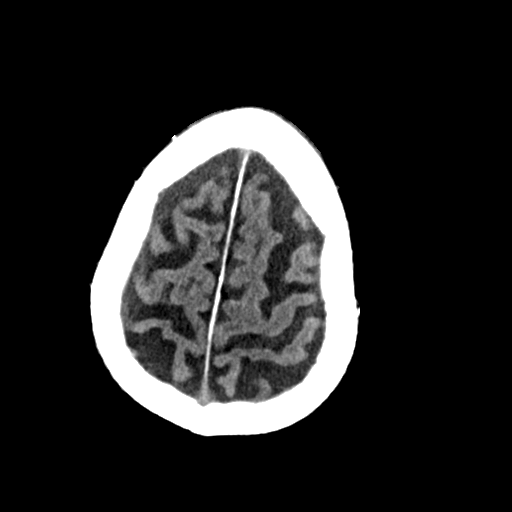
[im 31/36  brain]
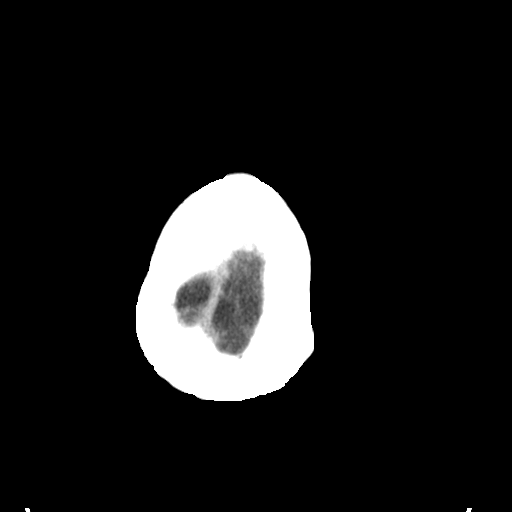

[Series 3: head bone · axial · 0.48mm/px · z∈[-214,-150]mm · 4 of 90 slices shown]
[im 9/90  bone]
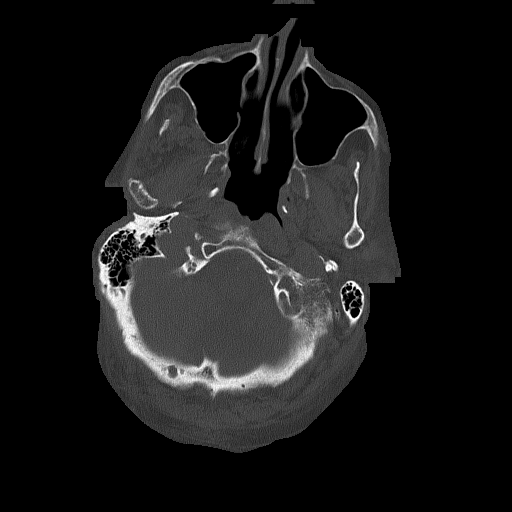
[im 18/90  bone]
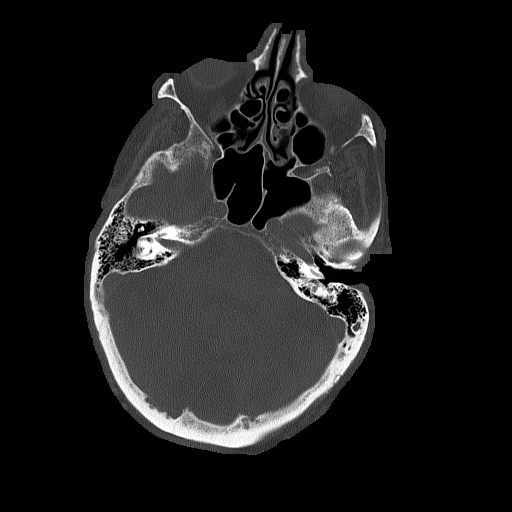
[im 27/90  bone]
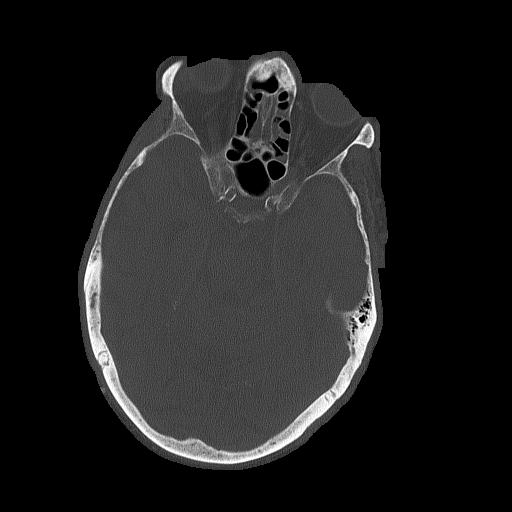
[im 41/90  bone]
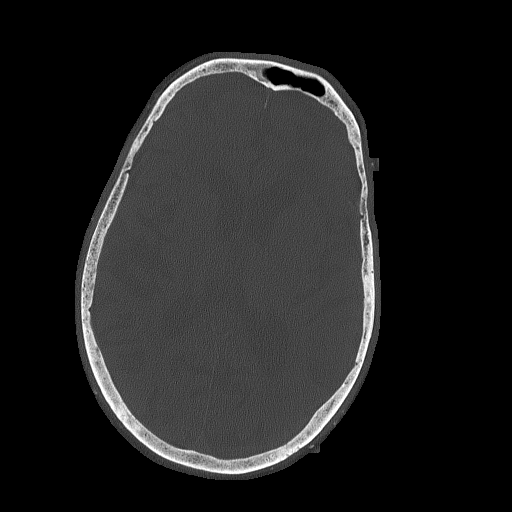

[Series 4: head without cor · coronal · non-contrast · 0.34mm/px · 3 of 73 slices shown]
[im 25/73  brain]
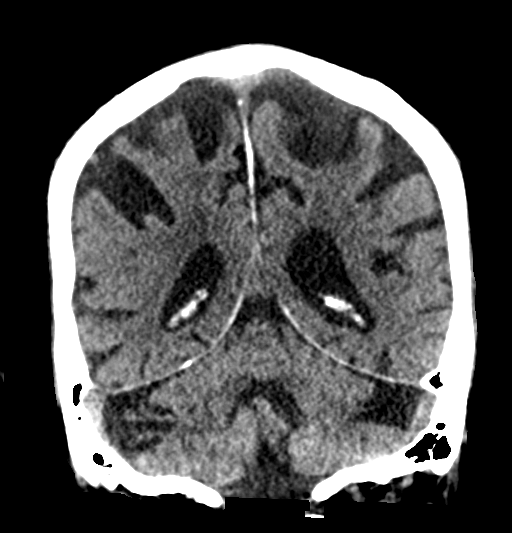
[im 33/73  brain]
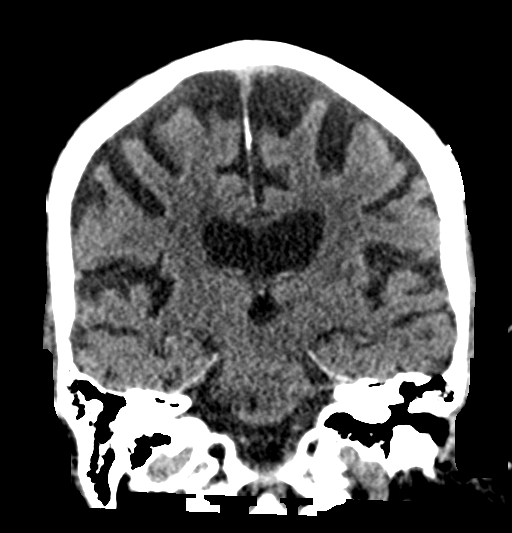
[im 41/73  brain]
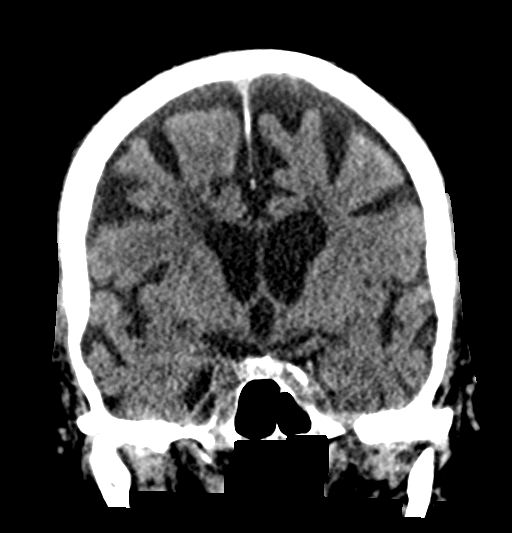

[Series 5: head without sag · sagittal · non-contrast · 0.38mm/px · 3 of 67 slices shown]
[im 23/67  brain]
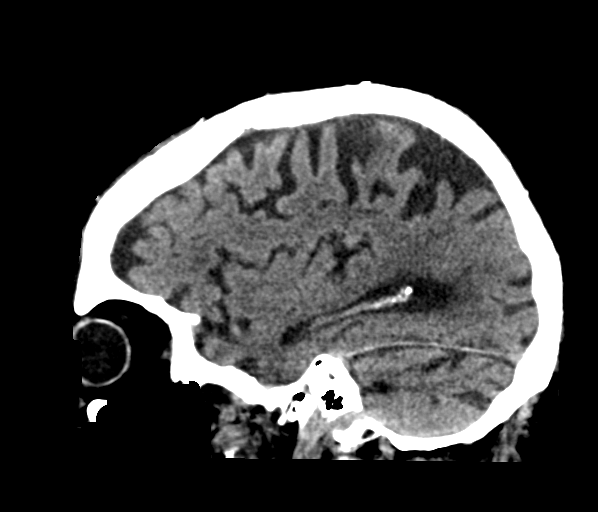
[im 34/67  brain]
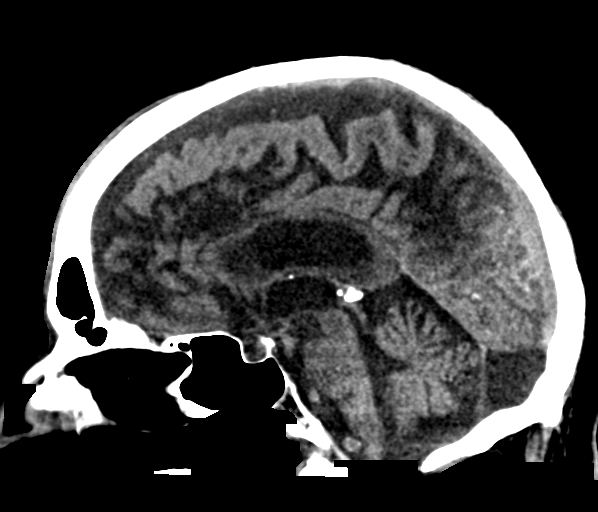
[im 45/67  brain]
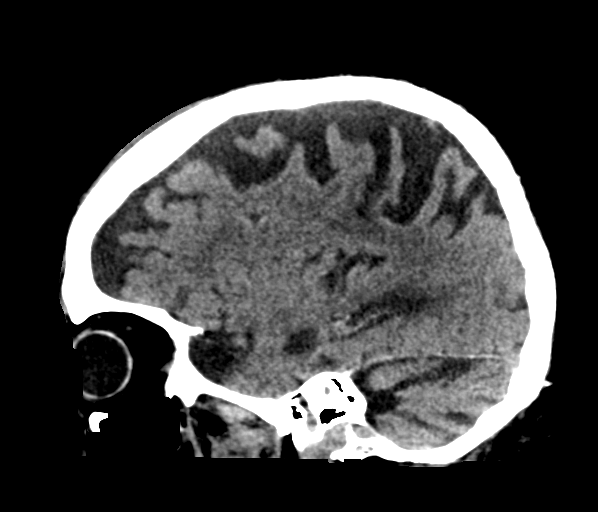

[17 of 47 positions shown; findings below may reference images not displayed]

FINDINGS: Brain: Similar moderately severe brain atrophy pattern with chronic
white matter microvascular changes throughout the cerebral
hemispheres. Remote left frontal infarct with encephalomalacia.
Cerebellar atrophy as well. No acute intracranial hemorrhage, mass
lesion, new infarction, midline shift, herniation, hydrocephalus, or
extra-axial fluid collection. Cisterns are patent.

Vascular: No hyperdense vessel or unexpected calcification.

Skull: Normal. Negative for fracture or focal lesion.

Sinuses/Orbits: Minor ethmoid mucosal thickening. Other sinuses and
mastoids are clear. No orbital abnormality.

Other: Left anterior frontal and anterior orbital soft tissue
bruising noted.
IMPRESSION: Left frontal and orbital soft tissue bruising.

No acute intracranial finding or interval change.

Stable brain atrophy, microvascular ischemic changes, and remote
infarcts.

## 2017-08-07 IMAGING — CR DG CHEST 2V
2 series · 2 of 2 positions shown · non-contrast
Comparison: 06/03/2016

CLINICAL DATA: Shortness of breath, slight chest pain with deep
inspiration, swelling in feet, history hypertension, diabetes
mellitus, coronary artery disease, CHF, atrial fibrillation

EXAM:
CHEST  2 VIEW

[chest pa]
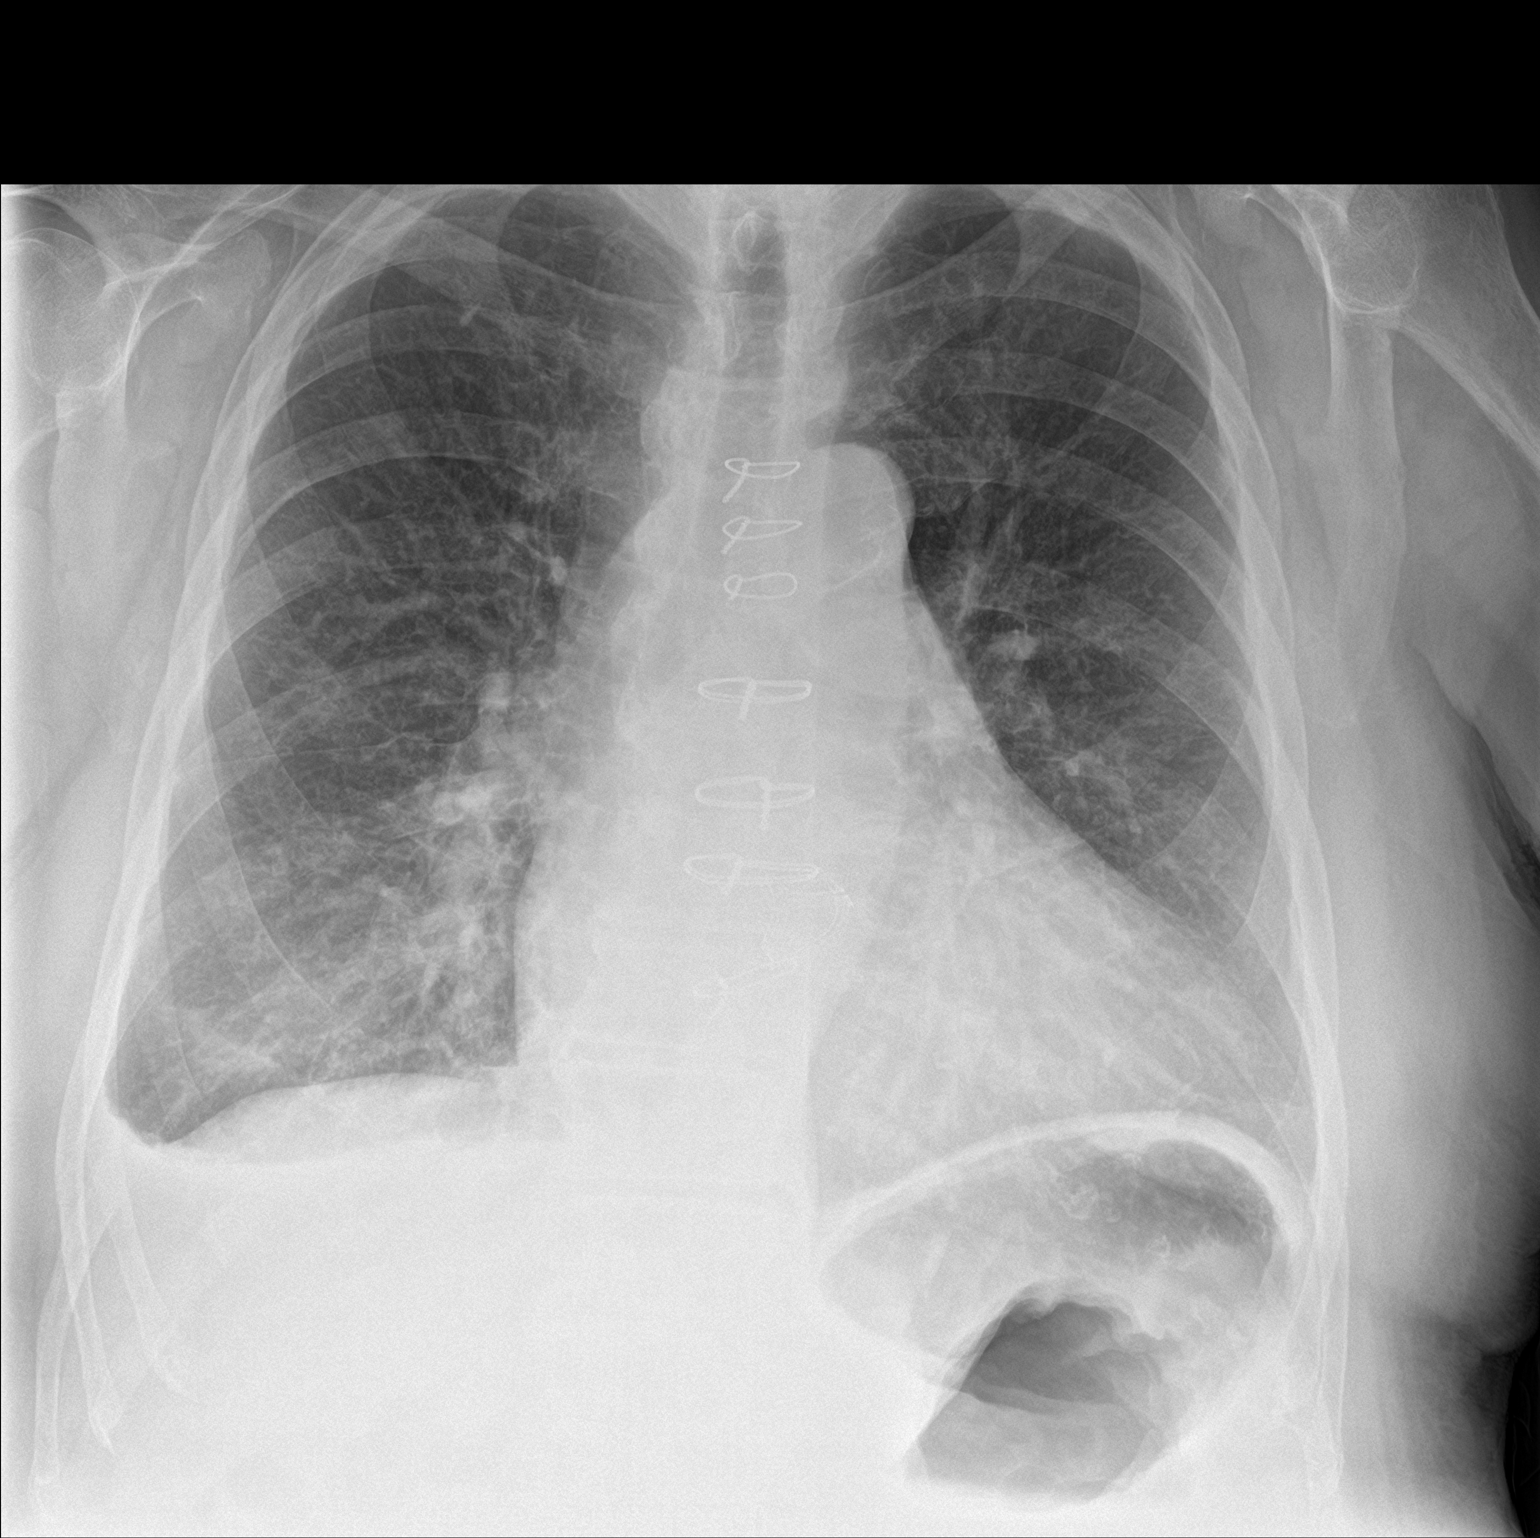

[chest lat]
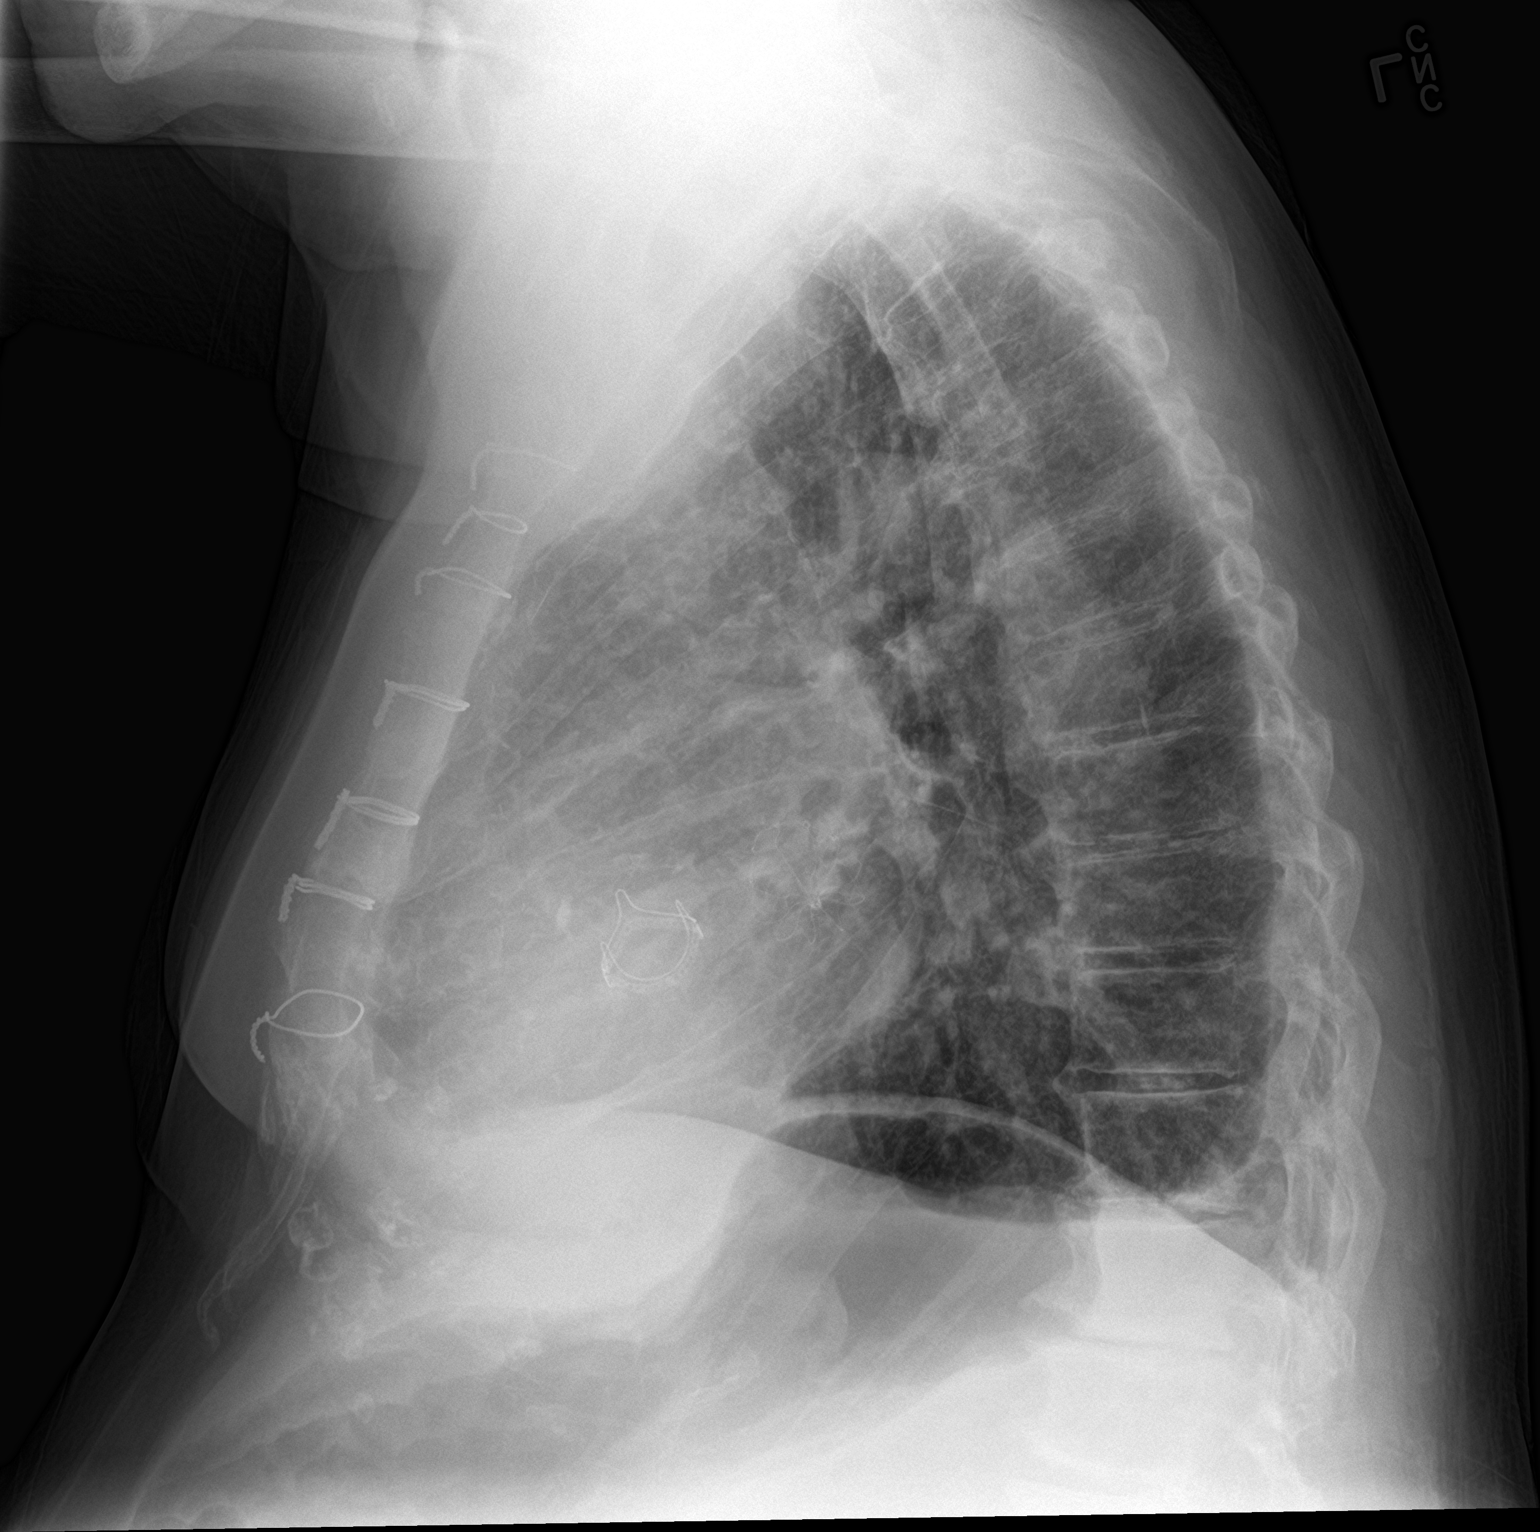

[2 of 2 positions shown; findings below may reference images not displayed]

FINDINGS: Enlargement of cardiac silhouette post median sternotomy and AVR.

Atherosclerotic calcification aorta.

Slight pulmonary vascular congestion.

Interstitial prominence in the mid to lower lungs suggesting mild
failure.

RIGHT pleural effusion, small.

No segmental consolidation or pneumothorax.

Bones demineralized.
IMPRESSION: Enlargement of cardiac silhouette with pulmonary vascular
congestion, small RIGHT pleural effusion, and suspect mild pulmonary
edema.

Post AVR.

Aortic atherosclerosis.
